# Patient Record
Sex: Female | Born: 1960 | Race: White | Hispanic: No | Smoking: Current every day smoker
Health system: Southern US, Community
[De-identification: ages and names within clinical notes are randomized; demographics above are authoritative.]

## PROBLEM LIST (undated history)

## (undated) DIAGNOSIS — K219 Gastro-esophageal reflux disease without esophagitis: Secondary | ICD-10-CM

## (undated) DIAGNOSIS — G4700 Insomnia, unspecified: Secondary | ICD-10-CM

## (undated) DIAGNOSIS — E785 Hyperlipidemia, unspecified: Secondary | ICD-10-CM

## (undated) DIAGNOSIS — M797 Fibromyalgia: Secondary | ICD-10-CM

## (undated) DIAGNOSIS — R0982 Postnasal drip: Secondary | ICD-10-CM

## (undated) DIAGNOSIS — F419 Anxiety disorder, unspecified: Secondary | ICD-10-CM

## (undated) DIAGNOSIS — M1990 Unspecified osteoarthritis, unspecified site: Secondary | ICD-10-CM

## (undated) DIAGNOSIS — K449 Diaphragmatic hernia without obstruction or gangrene: Secondary | ICD-10-CM

## (undated) DIAGNOSIS — K589 Irritable bowel syndrome without diarrhea: Secondary | ICD-10-CM

## (undated) DIAGNOSIS — T8859XA Other complications of anesthesia, initial encounter: Secondary | ICD-10-CM

## (undated) DIAGNOSIS — J449 Chronic obstructive pulmonary disease, unspecified: Secondary | ICD-10-CM

## (undated) DIAGNOSIS — F32A Depression, unspecified: Secondary | ICD-10-CM

## (undated) DIAGNOSIS — R011 Cardiac murmur, unspecified: Secondary | ICD-10-CM

## (undated) DIAGNOSIS — B1920 Unspecified viral hepatitis C without hepatic coma: Secondary | ICD-10-CM

## (undated) DIAGNOSIS — G510 Bell's palsy: Secondary | ICD-10-CM

## (undated) DIAGNOSIS — I10 Essential (primary) hypertension: Secondary | ICD-10-CM

## (undated) DIAGNOSIS — J45909 Unspecified asthma, uncomplicated: Secondary | ICD-10-CM

## (undated) DIAGNOSIS — E039 Hypothyroidism, unspecified: Secondary | ICD-10-CM

## (undated) DIAGNOSIS — M719 Bursopathy, unspecified: Secondary | ICD-10-CM

## (undated) DIAGNOSIS — E079 Disorder of thyroid, unspecified: Secondary | ICD-10-CM

## (undated) DIAGNOSIS — G8918 Other acute postprocedural pain: Secondary | ICD-10-CM

## (undated) DIAGNOSIS — F329 Major depressive disorder, single episode, unspecified: Secondary | ICD-10-CM

## (undated) HISTORY — PX: FOOT SURGERY: SHX648

## (undated) HISTORY — DX: Fibromyalgia: M79.7

## (undated) HISTORY — DX: Unspecified asthma, uncomplicated: J45.909

## (undated) HISTORY — DX: Cardiac murmur, unspecified: R01.1

## (undated) HISTORY — DX: Depression, unspecified: F32.A

## (undated) HISTORY — DX: Diaphragmatic hernia without obstruction or gangrene: K44.9

## (undated) HISTORY — DX: Unspecified osteoarthritis, unspecified site: M19.90

## (undated) HISTORY — DX: Irritable bowel syndrome, unspecified: K58.9

## (undated) HISTORY — DX: Anxiety disorder, unspecified: F41.9

## (undated) HISTORY — DX: Unspecified viral hepatitis C without hepatic coma: B19.20

## (undated) HISTORY — PX: PARTIAL HYSTERECTOMY: SHX80

## (undated) HISTORY — DX: Essential (primary) hypertension: I10

## (undated) HISTORY — DX: Hyperlipidemia, unspecified: E78.5

## (undated) HISTORY — DX: Bell's palsy: G51.0

## (undated) HISTORY — DX: Chronic obstructive pulmonary disease, unspecified: J44.9

## (undated) HISTORY — DX: Other acute postprocedural pain: G89.18

## (undated) HISTORY — DX: Disorder of thyroid, unspecified: E07.9

## (undated) HISTORY — DX: Insomnia, unspecified: G47.00

## (undated) HISTORY — PX: ELBOW SURGERY: SHX618

## (undated) HISTORY — DX: Bursopathy, unspecified: M71.9

## (undated) HISTORY — PX: TOTAL SHOULDER REPLACEMENT: SUR1217

## (undated) HISTORY — PX: NOSE SURGERY: SHX723

## (undated) HISTORY — PX: CARPAL TUNNEL RELEASE: SHX101

## (undated) HISTORY — DX: Major depressive disorder, single episode, unspecified: F32.9

---

## 2005-01-24 ENCOUNTER — Ambulatory Visit: Payer: Self-pay | Admitting: Family Medicine

## 2005-04-09 ENCOUNTER — Ambulatory Visit: Payer: Self-pay | Admitting: Family Medicine

## 2005-04-16 ENCOUNTER — Emergency Department: Payer: Self-pay | Admitting: Emergency Medicine

## 2005-04-16 ENCOUNTER — Ambulatory Visit: Payer: Self-pay | Admitting: Family Medicine

## 2005-09-24 ENCOUNTER — Ambulatory Visit: Payer: Self-pay | Admitting: General Surgery

## 2005-11-01 ENCOUNTER — Ambulatory Visit: Payer: Self-pay | Admitting: Family Medicine

## 2005-11-11 ENCOUNTER — Ambulatory Visit: Payer: Self-pay | Admitting: Family Medicine

## 2005-11-14 ENCOUNTER — Ambulatory Visit: Payer: Self-pay | Admitting: Unknown Physician Specialty

## 2006-05-14 ENCOUNTER — Ambulatory Visit: Payer: Self-pay

## 2007-05-26 ENCOUNTER — Ambulatory Visit: Payer: Self-pay | Admitting: Family Medicine

## 2007-07-07 ENCOUNTER — Ambulatory Visit: Payer: Self-pay | Admitting: Family Medicine

## 2007-12-07 ENCOUNTER — Emergency Department: Payer: Self-pay | Admitting: Emergency Medicine

## 2008-07-17 ENCOUNTER — Emergency Department: Payer: Self-pay | Admitting: Emergency Medicine

## 2009-01-17 ENCOUNTER — Ambulatory Visit: Payer: Self-pay | Admitting: Family Medicine

## 2009-05-15 ENCOUNTER — Ambulatory Visit: Payer: Self-pay | Admitting: Family Medicine

## 2009-06-29 ENCOUNTER — Ambulatory Visit: Payer: Self-pay | Admitting: Specialist

## 2009-08-21 ENCOUNTER — Ambulatory Visit: Payer: Self-pay | Admitting: Pain Medicine

## 2009-08-28 ENCOUNTER — Ambulatory Visit: Payer: Self-pay | Admitting: Pain Medicine

## 2009-09-28 ENCOUNTER — Ambulatory Visit: Payer: Self-pay | Admitting: Pain Medicine

## 2009-10-11 ENCOUNTER — Ambulatory Visit: Payer: Self-pay | Admitting: Pain Medicine

## 2009-10-31 ENCOUNTER — Ambulatory Visit: Payer: Self-pay | Admitting: Orthopedic Surgery

## 2009-11-16 ENCOUNTER — Ambulatory Visit: Payer: Self-pay | Admitting: Orthopedic Surgery

## 2009-11-18 ENCOUNTER — Emergency Department: Payer: Self-pay | Admitting: Emergency Medicine

## 2010-01-11 ENCOUNTER — Ambulatory Visit: Payer: Self-pay | Admitting: Pain Medicine

## 2010-01-22 ENCOUNTER — Ambulatory Visit: Payer: Self-pay | Admitting: Pain Medicine

## 2010-02-20 ENCOUNTER — Ambulatory Visit: Payer: Self-pay | Admitting: Pain Medicine

## 2010-05-25 ENCOUNTER — Ambulatory Visit: Payer: Self-pay | Admitting: Family Medicine

## 2010-06-05 ENCOUNTER — Ambulatory Visit: Payer: Self-pay | Admitting: Orthopedic Surgery

## 2010-07-02 ENCOUNTER — Ambulatory Visit: Payer: Self-pay | Admitting: Family Medicine

## 2010-08-14 ENCOUNTER — Ambulatory Visit: Payer: Self-pay | Admitting: Unknown Physician Specialty

## 2011-01-16 ENCOUNTER — Emergency Department: Payer: Self-pay | Admitting: Internal Medicine

## 2011-01-19 ENCOUNTER — Ambulatory Visit: Payer: Self-pay | Admitting: Unknown Physician Specialty

## 2011-02-08 ENCOUNTER — Ambulatory Visit: Payer: Self-pay | Admitting: Unknown Physician Specialty

## 2011-02-14 ENCOUNTER — Ambulatory Visit: Payer: Self-pay | Admitting: Unknown Physician Specialty

## 2011-07-16 HISTORY — PX: BACK SURGERY: SHX140

## 2011-08-14 ENCOUNTER — Ambulatory Visit: Payer: Self-pay | Admitting: Unknown Physician Specialty

## 2011-08-14 LAB — CREATININE, SERUM: EGFR (Non-African Amer.): >60

## 2012-01-24 ENCOUNTER — Ambulatory Visit: Payer: Self-pay | Admitting: Pain Medicine

## 2012-02-15 ENCOUNTER — Emergency Department: Payer: Self-pay | Admitting: Emergency Medicine

## 2012-02-20 ENCOUNTER — Emergency Department: Payer: Self-pay | Admitting: Emergency Medicine

## 2012-05-14 ENCOUNTER — Ambulatory Visit: Payer: Self-pay | Admitting: Family Medicine

## 2012-12-24 ENCOUNTER — Ambulatory Visit: Payer: Self-pay | Admitting: Family Medicine

## 2013-05-13 ENCOUNTER — Ambulatory Visit: Payer: Self-pay | Admitting: Family Medicine

## 2013-06-17 ENCOUNTER — Emergency Department: Payer: Self-pay | Admitting: Internal Medicine

## 2013-12-10 DIAGNOSIS — F32A Depression, unspecified: Secondary | ICD-10-CM | POA: Insufficient documentation

## 2013-12-10 DIAGNOSIS — F329 Major depressive disorder, single episode, unspecified: Secondary | ICD-10-CM | POA: Insufficient documentation

## 2013-12-10 DIAGNOSIS — E039 Hypothyroidism, unspecified: Secondary | ICD-10-CM | POA: Insufficient documentation

## 2013-12-11 DIAGNOSIS — I10 Essential (primary) hypertension: Secondary | ICD-10-CM | POA: Insufficient documentation

## 2014-02-10 DIAGNOSIS — E538 Deficiency of other specified B group vitamins: Secondary | ICD-10-CM | POA: Insufficient documentation

## 2014-03-10 DIAGNOSIS — Z8619 Personal history of other infectious and parasitic diseases: Secondary | ICD-10-CM | POA: Insufficient documentation

## 2014-03-22 DIAGNOSIS — J449 Chronic obstructive pulmonary disease, unspecified: Secondary | ICD-10-CM | POA: Insufficient documentation

## 2014-03-22 DIAGNOSIS — E669 Obesity, unspecified: Secondary | ICD-10-CM | POA: Insufficient documentation

## 2014-05-10 DIAGNOSIS — M4802 Spinal stenosis, cervical region: Secondary | ICD-10-CM | POA: Insufficient documentation

## 2014-10-21 DIAGNOSIS — M4712 Other spondylosis with myelopathy, cervical region: Secondary | ICD-10-CM | POA: Insufficient documentation

## 2014-10-21 DIAGNOSIS — G9520 Unspecified cord compression: Secondary | ICD-10-CM | POA: Insufficient documentation

## 2014-10-21 DIAGNOSIS — M4722 Other spondylosis with radiculopathy, cervical region: Secondary | ICD-10-CM | POA: Insufficient documentation

## 2014-10-21 DIAGNOSIS — M47812 Spondylosis without myelopathy or radiculopathy, cervical region: Secondary | ICD-10-CM | POA: Insufficient documentation

## 2014-10-21 HISTORY — PX: OTHER SURGICAL HISTORY: SHX169

## 2015-01-02 DIAGNOSIS — Z720 Tobacco use: Secondary | ICD-10-CM | POA: Insufficient documentation

## 2015-01-02 DIAGNOSIS — Z889 Allergy status to unspecified drugs, medicaments and biological substances status: Secondary | ICD-10-CM | POA: Insufficient documentation

## 2015-01-02 DIAGNOSIS — J418 Mixed simple and mucopurulent chronic bronchitis: Secondary | ICD-10-CM | POA: Insufficient documentation

## 2015-01-02 DIAGNOSIS — J449 Chronic obstructive pulmonary disease, unspecified: Secondary | ICD-10-CM | POA: Insufficient documentation

## 2015-03-21 ENCOUNTER — Other Ambulatory Visit: Payer: Self-pay | Admitting: Pediatrics

## 2015-03-21 DIAGNOSIS — Z1231 Encounter for screening mammogram for malignant neoplasm of breast: Secondary | ICD-10-CM

## 2015-03-28 ENCOUNTER — Ambulatory Visit: Payer: Self-pay | Attending: Pediatrics

## 2015-09-12 DIAGNOSIS — K1230 Oral mucositis (ulcerative), unspecified: Secondary | ICD-10-CM | POA: Insufficient documentation

## 2015-10-09 ENCOUNTER — Ambulatory Visit: Payer: Self-pay | Admitting: General Surgery

## 2015-10-09 DIAGNOSIS — R1030 Lower abdominal pain, unspecified: Secondary | ICD-10-CM | POA: Insufficient documentation

## 2016-01-02 ENCOUNTER — Encounter (INDEPENDENT_AMBULATORY_CARE_PROVIDER_SITE_OTHER): Payer: Self-pay

## 2016-01-02 ENCOUNTER — Encounter: Payer: Self-pay | Admitting: Pain Medicine

## 2016-01-02 ENCOUNTER — Ambulatory Visit: Payer: Medicaid Other | Attending: Pain Medicine | Admitting: Pain Medicine

## 2016-01-02 VITALS — BP 135/65 | HR 77 | Temp 98.5°F | Resp 20 | Ht 65.0 in | Wt 250.0 lb

## 2016-01-02 DIAGNOSIS — E039 Hypothyroidism, unspecified: Secondary | ICD-10-CM | POA: Insufficient documentation

## 2016-01-02 DIAGNOSIS — M25512 Pain in left shoulder: Secondary | ICD-10-CM | POA: Diagnosis not present

## 2016-01-02 DIAGNOSIS — M79604 Pain in right leg: Secondary | ICD-10-CM | POA: Diagnosis not present

## 2016-01-02 DIAGNOSIS — Z96611 Presence of right artificial shoulder joint: Secondary | ICD-10-CM | POA: Diagnosis not present

## 2016-01-02 DIAGNOSIS — F329 Major depressive disorder, single episode, unspecified: Secondary | ICD-10-CM | POA: Insufficient documentation

## 2016-01-02 DIAGNOSIS — F17210 Nicotine dependence, cigarettes, uncomplicated: Secondary | ICD-10-CM | POA: Diagnosis not present

## 2016-01-02 DIAGNOSIS — F1190 Opioid use, unspecified, uncomplicated: Secondary | ICD-10-CM | POA: Insufficient documentation

## 2016-01-02 DIAGNOSIS — J449 Chronic obstructive pulmonary disease, unspecified: Secondary | ICD-10-CM | POA: Diagnosis not present

## 2016-01-02 DIAGNOSIS — Z96619 Presence of unspecified artificial shoulder joint: Secondary | ICD-10-CM

## 2016-01-02 DIAGNOSIS — Z8619 Personal history of other infectious and parasitic diseases: Secondary | ICD-10-CM | POA: Diagnosis not present

## 2016-01-02 DIAGNOSIS — M5441 Lumbago with sciatica, right side: Secondary | ICD-10-CM

## 2016-01-02 DIAGNOSIS — I10 Essential (primary) hypertension: Secondary | ICD-10-CM | POA: Diagnosis not present

## 2016-01-02 DIAGNOSIS — K589 Irritable bowel syndrome without diarrhea: Secondary | ICD-10-CM | POA: Insufficient documentation

## 2016-01-02 DIAGNOSIS — E669 Obesity, unspecified: Secondary | ICD-10-CM | POA: Diagnosis not present

## 2016-01-02 DIAGNOSIS — M47812 Spondylosis without myelopathy or radiculopathy, cervical region: Secondary | ICD-10-CM

## 2016-01-02 DIAGNOSIS — Z79899 Other long term (current) drug therapy: Secondary | ICD-10-CM

## 2016-01-02 DIAGNOSIS — M160 Bilateral primary osteoarthritis of hip: Secondary | ICD-10-CM | POA: Insufficient documentation

## 2016-01-02 DIAGNOSIS — M5126 Other intervertebral disc displacement, lumbar region: Secondary | ICD-10-CM | POA: Insufficient documentation

## 2016-01-02 DIAGNOSIS — M797 Fibromyalgia: Secondary | ICD-10-CM | POA: Insufficient documentation

## 2016-01-02 DIAGNOSIS — M4802 Spinal stenosis, cervical region: Secondary | ICD-10-CM | POA: Diagnosis not present

## 2016-01-02 DIAGNOSIS — F419 Anxiety disorder, unspecified: Secondary | ICD-10-CM | POA: Insufficient documentation

## 2016-01-02 DIAGNOSIS — M545 Low back pain: Secondary | ICD-10-CM

## 2016-01-02 DIAGNOSIS — Z9889 Other specified postprocedural states: Secondary | ICD-10-CM | POA: Insufficient documentation

## 2016-01-02 DIAGNOSIS — Z79891 Long term (current) use of opiate analgesic: Secondary | ICD-10-CM | POA: Diagnosis not present

## 2016-01-02 DIAGNOSIS — M961 Postlaminectomy syndrome, not elsewhere classified: Secondary | ICD-10-CM

## 2016-01-02 DIAGNOSIS — M25552 Pain in left hip: Secondary | ICD-10-CM | POA: Insufficient documentation

## 2016-01-02 DIAGNOSIS — G8929 Other chronic pain: Secondary | ICD-10-CM | POA: Insufficient documentation

## 2016-01-02 DIAGNOSIS — G5622 Lesion of ulnar nerve, left upper limb: Secondary | ICD-10-CM | POA: Insufficient documentation

## 2016-01-02 DIAGNOSIS — J45909 Unspecified asthma, uncomplicated: Secondary | ICD-10-CM | POA: Insufficient documentation

## 2016-01-02 DIAGNOSIS — M25559 Pain in unspecified hip: Secondary | ICD-10-CM

## 2016-01-02 DIAGNOSIS — Z96612 Presence of left artificial shoulder joint: Secondary | ICD-10-CM | POA: Insufficient documentation

## 2016-01-02 DIAGNOSIS — E538 Deficiency of other specified B group vitamins: Secondary | ICD-10-CM | POA: Diagnosis not present

## 2016-01-02 DIAGNOSIS — M47816 Spondylosis without myelopathy or radiculopathy, lumbar region: Secondary | ICD-10-CM | POA: Insufficient documentation

## 2016-01-02 DIAGNOSIS — Z5181 Encounter for therapeutic drug level monitoring: Secondary | ICD-10-CM

## 2016-01-02 DIAGNOSIS — M25551 Pain in right hip: Secondary | ICD-10-CM | POA: Insufficient documentation

## 2016-01-02 DIAGNOSIS — M25511 Pain in right shoulder: Secondary | ICD-10-CM | POA: Insufficient documentation

## 2016-01-02 DIAGNOSIS — M25519 Pain in unspecified shoulder: Secondary | ICD-10-CM | POA: Diagnosis present

## 2016-01-02 DIAGNOSIS — M47892 Other spondylosis, cervical region: Secondary | ICD-10-CM | POA: Diagnosis not present

## 2016-01-02 DIAGNOSIS — M5021 Other cervical disc displacement,  high cervical region: Secondary | ICD-10-CM | POA: Insufficient documentation

## 2016-01-02 DIAGNOSIS — Z0189 Encounter for other specified special examinations: Secondary | ICD-10-CM | POA: Insufficient documentation

## 2016-01-02 DIAGNOSIS — M542 Cervicalgia: Secondary | ICD-10-CM | POA: Diagnosis present

## 2016-01-02 DIAGNOSIS — M539 Dorsopathy, unspecified: Secondary | ICD-10-CM

## 2016-01-02 DIAGNOSIS — M5412 Radiculopathy, cervical region: Secondary | ICD-10-CM

## 2016-01-02 NOTE — Patient Instructions (Signed)
Instructed to get labs and xrays done in the medical mall today.

## 2016-01-02 NOTE — Progress Notes (Signed)
Safety precautions to be maintained throughout the outpatient stay will include: orient to surroundings, keep bed in low position, maintain call bell within reach at all times, provide assistance with transfer out of bed and ambulation.  

## 2016-01-02 NOTE — Progress Notes (Signed)
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 03/10/2014  . BP (high blood pressure) 12/11/2013  . Adult hypothyroidism 12/10/2013  . Pain in shoulder 12/10/2013  . Adiposity 03/22/2014  . Mucositis oral 09/12/2015  . Failed back syndrome of lumbar spine 12/17/2011  . Groin pain 10/09/2015  . Current tobacco use 01/02/2015  . Chronic shoulder pain (Location of Primary Source of Pain) (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) 01/02/2016  . History of total shoulder replacement (Bilateral) 01/02/2016  . Chronic hip pain (Location of Secondary source of pain) (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) 01/02/2016  . 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) 01/02/2016  . Chronic low back pain (Location of Tertiary source of pain) (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) 01/02/2016  . Lumbar facet syndrome (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) 01/02/2016  . Lumbar spondylosis 01/02/2016  . 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 Chronic pain of right lower extremity 01/02/2016  . Chronic neck pain (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) 01/02/2016  . Chronic cervical radicular pain (Right) 01/02/2016  . Ulnar neuropathy of left upper  extremity 01/02/2016  . Hx of cervical spine surgery 01/02/2016  . Cervical spondylosis 01/02/2016  . Encounter for therapeutic drug level monitoring 01/02/2016  . Encounter for pain management planning 01/02/2016   Resolved Ambulatory Problems    Diagnosis Date Noted  . No Resolved Ambulatory Problems   Past Medical History  Diagnosis Date  . Heart murmur   . COPD (chronic obstructive pulmonary  disease) ([place])   . Asthma   . Bell's palsy   . Anxiety   . Depression   . Insomnia   . Hiatal hernia   . IBS (irritable bowel syndrome)   . Hepatitis C   . Thyroid disease   . Fibromyalgia     Constitutional Exam  Vitals: Blood pressure 135/65, pulse 77, temperature 98.5 F (36.9 C), resp. rate 20, height 5\' 5"  (1.651 m), weight 250 lb (113.399 kg), SpO2 97 %. General appearance: Well nourished, well developed, and well hydrated. In no acute distress Calculated BMI/Body habitus: Body mass index is 41.6 kg/(m^2). (>40 kg/m2) Extreme obesity (Class III) - 254% higher incidence of chronic pain Psych/Mental status: Alert and oriented x 3 (person, place, & time) Eyes: PERLA Respiratory: No evidence of acute respiratory distress  Cervical Spine Exam  Inspection: No masses, redness, or swelling Alignment: Symmetrical ROM: Functional: Adequate ROM Active: Unrestricted ROM Stability: No instability detected Muscle strength & Tone: Functionally intact Sensory: Unimpaired Palpation: No complaints of tenderness  Upper Extremity (UE) Exam    Side: Right upper extremity  Side: Left upper extremity  Inspection: The patient has her right arm on a sling due to recent shoulder surgery where she had it replaced.  Inspection: No masses, redness, swelling, or asymmetry  ROM:  ROM:  Functional: Pain-restricted ROM  Functional: Adequate ROM  Active: Zero ROM at this time.   Active: Unrestricted ROM  Muscle strength & Tone: Functionally intact  Muscle strength & Tone: Functionally intact   Sensory: Movement-associated pain  Sensory: Unimpaired  Palpation: Tender  Palpation: Non-contributory   Thoracic Spine Exam  Inspection: No masses, redness, or swelling Alignment: Symmetrical ROM: Functional: Adequate ROM Active: Unrestricted ROM Stability: No instability detected Sensory: Unimpaired Muscle strength & Tone: Functionally intact Palpation: No complaints of tenderness  Lumbar Spine Exam  Inspection: No masses, redness, or swelling Alignment: Symmetrical ROM: Functional: Limited ROM Active: Limited ROM Stability: No instability detected Muscle strength & Tone: Functionally intact Sensory: Unimpaired Palpation: No complaints of tenderness Provocative Tests: Lumbar Hyperextension and rotation test: deferred Patrick's Maneuver: deferred  Gait & Posture Assessment  Ambulation: Unassisted Gait: Unaffected Posture: WNL  Lower Extremity Exam    Side: Right lower extremity  Side: Left lower extremity  Inspection: No masses, redness, swelling, or asymmetry ROM:  Inspection: No masses, redness, swelling, or asymmetry ROM:  Functional: Adequate ROM  Functional: Adequate ROM  Active: Unrestricted ROM  Active: Unrestricted ROM  Muscle strength & Tone: Functionally intact  Muscle strength & Tone: Functionally intact  Sensory: Unimpaired  Sensory: Unimpaired  Palpation: Non-contributory  Palpation: Non-contributory   Assessment  Primary Diagnosis & Pertinent Problem List: The primary encounter diagnosis was Chronic pain. Diagnoses of Long term current use of opiate analgesic, Long term prescription opiate use, Opiate use, Chronic shoulder pain (Location of Primary Source of Pain) (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), History of total shoulder replacement, unspecified laterality, Chronic hip pain, unspecified laterality, Primary osteoarthritis of both hips, Chronic low back pain (Location of Tertiary source of pain) (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), Lumbar facet syndrome (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), Lumbar  spondylosis, unspecified spinal osteoarthritis, Failed back surgical syndrome, Chronic pain of right lower extremity, Chronic neck pain (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), Chronic cervical radicular pain (Right), Ulnar neuropathy of left upper extremity, Hx of cervical spine surgery, Cervical spondylosis, Encounter for therapeutic drug level monitoring, and Encounter for pain management planning were also pertinent to this visit.  Visit Diagnosis: 1. Chronic pain   2. Long term current use of opiate analgesic  3. Long term prescription opiate use   4. Opiate use   5. Chronic shoulder pain (Location of Primary Source of Pain) (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)   6. History of total shoulder replacement, unspecified laterality   7. Chronic hip pain, unspecified laterality   8. Primary osteoarthritis of both hips   9. Chronic low back pain (Location of Tertiary source of pain) (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)   10. Lumbar facet syndrome (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)   11. Lumbar spondylosis, unspecified spinal osteoarthritis   12. Failed back surgical syndrome   13. Chronic pain of right lower extremity   14. Chronic neck pain (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)   15. Chronic cervical radicular pain (Right)   16. Ulnar neuropathy of left upper extremity   17. Hx of cervical spine surgery   18. Cervical spondylosis   19. Encounter for therapeutic drug level monitoring   20. Encounter for pain management planning     Assessment: No problem-specific assessment & plan notes found for this encounter.   Plan of Care  Initial Treatment Plan:  Please be advised that as per protocol, today's visit has been an evaluation only. We have not taken over the patient's controlled substance management.  Problem List Items Addressed This Visit      High   Cervical spondylosis (Chronic)   Relevant Medications   acetaminophen (TYLENOL) 325 MG tablet   Oxycodone HCl 10 MG TABS   Chronic cervical radicular pain (Right) (Chronic)   Relevant Medications   ALPRAZolam (XANAX) 1  MG tablet   citalopram (CELEXA) 40 MG tablet   Chronic hip pain (Location of Secondary source of pain) (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) (Chronic)   Relevant Medications   acetaminophen (TYLENOL) 325 MG tablet   citalopram (CELEXA) 40 MG tablet   Oxycodone HCl 10 MG TABS   Other Relevant Orders   DG HIP UNILAT W OR W/O PELVIS 2-3 VIEWS LEFT   DG HIP UNILAT W OR W/O PELVIS 2-3 VIEWS RIGHT   Chronic low back pain (Location of Tertiary source of pain) (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) (Chronic)   Relevant Medications   acetaminophen (TYLENOL) 325 MG tablet   Oxycodone HCl 10 MG TABS   Other Relevant Orders   DG Lumbar Spine Complete W/Bend   DG Si Joints   Chronic neck pain (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) (Chronic)   Relevant Medications   acetaminophen (TYLENOL) 325 MG tablet   citalopram (CELEXA) 40 MG tablet   Oxycodone HCl 10 MG TABS   Chronic pain - Primary (Chronic)   Relevant Medications   acetaminophen (TYLENOL) 325 MG tablet   citalopram (CELEXA) 40 MG tablet   Oxycodone HCl 10 MG TABS   Other Relevant Orders   Comprehensive metabolic panel   C-reactive protein   Magnesium   Sedimentation rate   Vitamin B12   25-Hydroxyvitamin D Lcms D2+D3   Chronic pain of right lower extremity (Chronic)   Relevant Medications   acetaminophen (TYLENOL) 325 MG tablet   citalopram (CELEXA) 40 MG tablet   Oxycodone HCl 10 MG TABS   Chronic shoulder pain (Location of Primary Source of Pain) (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) (Chronic)   Relevant Orders   DG Shoulder Left   DG Shoulder Right   Failed back surgical syndrome (Chronic)   Relevant Medications   acetaminophen (TYLENOL) 325 MG tablet   Oxycodone HCl 10 MG TABS   History of total shoulder replacement (Bilateral) (Chronic)   Relevant Orders   DG Shoulder Left   DG Shoulder Right   Hx of cervical spine surgery (Chronic)   Lumbar  facet syndrome (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) (Chronic)   Relevant Medications   acetaminophen (TYLENOL) 325 MG tablet   Oxycodone HCl 10 MG TABS   Lumbar spondylosis  (Chronic)   Relevant Medications   acetaminophen (TYLENOL) 325 MG tablet   Oxycodone HCl 10 MG TABS   Other Relevant Orders   DG Lumbar Spine Complete W/Bend   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) (Chronic)   Relevant Medications   acetaminophen (TYLENOL) 325 MG tablet   Oxycodone HCl 10 MG TABS   Other Relevant Orders   DG HIP UNILAT W OR W/O PELVIS 2-3 VIEWS LEFT   DG HIP UNILAT W OR W/O PELVIS 2-3 VIEWS RIGHT   Ulnar neuropathy of left upper extremity (Chronic)   Relevant Medications   ALPRAZolam (XANAX) 1 MG tablet   citalopram (CELEXA) 40 MG tablet     Medium   Encounter for pain management planning   Encounter for therapeutic drug level monitoring   Long term current use of opiate analgesic (Chronic)   Relevant Orders   Compliance Drug Analysis, Ur   Long term prescription opiate use (Chronic)   Opiate use (Chronic)      Pharmacotherapy (Medications Ordered): No orders of the defined types were placed in this encounter.    Lab-work & Procedure Ordered: Orders Placed This Encounter  Procedures  . DG Lumbar Spine Complete W/Bend  . DG Si Joints  . DG HIP UNILAT W OR W/O PELVIS 2-3 VIEWS LEFT  . DG HIP UNILAT W OR W/O PELVIS 2-3 VIEWS RIGHT  . DG Shoulder Left  . DG Shoulder Right  . Compliance Drug Analysis, Ur  . Comprehensive metabolic panel  . C-reactive protein  . Magnesium  . Sedimentation rate  . Vitamin B12  . 25-Hydroxyvitamin D Lcms D2+D3    Imaging Ordered: DG LUMBAR SPINE COMPLETE W/BEND 6+V DG SI JOINTS DG HIP UNILAT W OR W/O PELVIS 2-3 VIEWS LEFT DG HIP UNILAT W OR W/O PELVIS 2-3 VIEWS RIGHT DG SHOULDER LEFT DG SHOULDER RIGHT  Interventional Therapies: Scheduled:  None at this time.    Considering:   Bilateral diagnostic suprascapular nerve block under fluoroscopic guidance and IV sedation.  Possible bilateral suprascapular nerve radiofrequency ablation, depending on the results of a diagnostic injection.    PRN Procedures:  None  at this time.    Referral(s) or Consult(s): Medical psychology consult for substance use disorder evaluation  Medications administered during this visit: Danielle [name] had no medications administered during this visit.  Prescriptions ordered during this visit: New Prescriptions   No medications on file    Requested PM Follow-up: Return for Return after [facility] (SUD) Eval.  No future appointments.   Primary Care Physician: [facility] PRIMARY CARE Location: [facility] Note by: Kathlen Brunswick. [name], M.D, DABA, DABAPM, DABPM, DABIPP, FIPP  Pain Score Disclaimer: We use the NRS-11 scale. This is a self-reported, subjective measurement of pain severity with only modest accuracy. It is used primarily to identify changes within a particular patient. It must be understood that outpatient pain scales are significantly less accurate that those used for research, where they can be applied under ideal controlled circumstances with minimal exposure to variables. In reality, the score is likely to be a combination of pain intensity and pain affect, where pain affect describes the degree of emotional arousal or changes in action readiness caused by the sensory experience of pain. Factors such as social and work situation, setting, emotional state, anxiety levels, expectation, and prior pain experience may  influence pain perception and show large inter-individual differences that may also be affected by time variables.  Patient instructions provided during this appointment: Patient Instructions  Instructed to get labs and xrays done in the medical mall today.

## 2016-01-08 ENCOUNTER — Other Ambulatory Visit
Admission: RE | Admit: 2016-01-08 | Discharge: 2016-01-08 | Disposition: A | Discharge status: Home / Self Care | Payer: Medicaid Other | Source: Ambulatory Visit | Attending: Pain Medicine | Admitting: Pain Medicine

## 2016-01-08 ENCOUNTER — Ambulatory Visit
Admission: RE | Admit: 2016-01-08 | Discharge: 2016-01-08 | Disposition: A | Discharge status: Home / Self Care | Payer: Medicaid Other | Source: Ambulatory Visit | Attending: Pain Medicine | Admitting: Pain Medicine

## 2016-01-08 DIAGNOSIS — G8929 Other chronic pain: Secondary | ICD-10-CM | POA: Diagnosis not present

## 2016-01-08 DIAGNOSIS — M85811 Other specified disorders of bone density and structure, right shoulder: Secondary | ICD-10-CM | POA: Diagnosis not present

## 2016-01-08 DIAGNOSIS — Z9889 Other specified postprocedural states: Secondary | ICD-10-CM | POA: Insufficient documentation

## 2016-01-08 DIAGNOSIS — Z96619 Presence of unspecified artificial shoulder joint: Secondary | ICD-10-CM

## 2016-01-08 DIAGNOSIS — M25511 Pain in right shoulder: Secondary | ICD-10-CM

## 2016-01-08 DIAGNOSIS — M160 Bilateral primary osteoarthritis of hip: Secondary | ICD-10-CM | POA: Insufficient documentation

## 2016-01-08 DIAGNOSIS — M5136 Other intervertebral disc degeneration, lumbar region: Secondary | ICD-10-CM | POA: Insufficient documentation

## 2016-01-08 DIAGNOSIS — R938 Abnormal findings on diagnostic imaging of other specified body structures: Secondary | ICD-10-CM | POA: Insufficient documentation

## 2016-01-08 DIAGNOSIS — M25512 Pain in left shoulder: Secondary | ICD-10-CM | POA: Diagnosis present

## 2016-01-08 DIAGNOSIS — M25559 Pain in unspecified hip: Secondary | ICD-10-CM

## 2016-01-08 DIAGNOSIS — M545 Low back pain: Principal | ICD-10-CM

## 2016-01-08 DIAGNOSIS — M47816 Spondylosis without myelopathy or radiculopathy, lumbar region: Secondary | ICD-10-CM

## 2016-01-08 LAB — COMPREHENSIVE METABOLIC PANEL
ALBUMIN: 3.7 g/dL (ref 3.5–5.0)
ALT: 14 U/L (ref 14–54)
ANION GAP: 7 (ref 5–15)
AST: 18 U/L (ref 15–41)
Alkaline Phosphatase: 79 U/L (ref 38–126)
BILIRUBIN TOTAL: 0.3 mg/dL (ref 0.3–1.2)
BUN: 8 mg/dL (ref 6–20)
CHLORIDE: 101 mmol/L (ref 101–111)
CO2: 29 mmol/L (ref 22–32)
Calcium: 9 mg/dL (ref 8.9–10.3)
Creatinine, Ser: 0.94 mg/dL (ref 0.44–1.00)
GFR calc Af Amer: >60 mL/min (ref >60)
GFR calc non Af Amer: >60 mL/min (ref >60)
GLUCOSE: 91 mg/dL (ref 65–99)
POTASSIUM: 3.5 mmol/L (ref 3.5–5.1)
Sodium: 137 mmol/L (ref 135–145)
TOTAL PROTEIN: 6.9 g/dL (ref 6.5–8.1)

## 2016-01-08 LAB — MAGNESIUM: Magnesium: 2.1 mg/dL (ref 1.7–2.4)

## 2016-01-08 LAB — SEDIMENTATION RATE: SED RATE: 37 mm/h — AB (ref 0–30)

## 2016-01-08 LAB — C-REACTIVE PROTEIN: CRP: 0.9 mg/dL (ref <1.0)

## 2016-01-08 LAB — VITAMIN B12

## 2016-01-09 ENCOUNTER — Telehealth: Payer: Self-pay | Admitting: *Deleted

## 2016-01-09 ENCOUNTER — Other Ambulatory Visit: Payer: Self-pay

## 2016-01-09 NOTE — Telephone Encounter (Signed)
Pt called stating she got everything completed except for the lumbar spine part of the xray due to surgery on her shoulder. Please give the pt a call...td

## 2016-01-09 NOTE — Telephone Encounter (Signed)
Spoke with patient and she states she has completed all of the orders except for the lumbar spine xray.  She has recently had shoulder replacement and was unable to do the xray due to decreased ROM.  States she has seen medical psychologist and had labwork drawn.  Transferred to secretary to get an appointment.

## 2016-01-10 ENCOUNTER — Telehealth: Payer: Self-pay | Admitting: *Deleted

## 2016-01-10 LAB — COMPLIANCE DRUG ANALYSIS, UR: PDF: 0

## 2016-01-10 NOTE — Telephone Encounter (Signed)
Return pt call. Lm gave appt for 01/11/16@ 9:40am...td

## 2016-01-11 ENCOUNTER — Ambulatory Visit: Payer: Medicaid Other | Attending: Pain Medicine | Admitting: Pain Medicine

## 2016-01-11 ENCOUNTER — Encounter: Payer: Self-pay | Admitting: Pain Medicine

## 2016-01-11 VITALS — HR 69 | Temp 98.5°F | Resp 18 | Ht 65.0 in | Wt 252.0 lb

## 2016-01-11 DIAGNOSIS — M797 Fibromyalgia: Secondary | ICD-10-CM | POA: Diagnosis not present

## 2016-01-11 DIAGNOSIS — Z8619 Personal history of other infectious and parasitic diseases: Secondary | ICD-10-CM | POA: Insufficient documentation

## 2016-01-11 DIAGNOSIS — M47892 Other spondylosis, cervical region: Secondary | ICD-10-CM | POA: Insufficient documentation

## 2016-01-11 DIAGNOSIS — M533 Sacrococcygeal disorders, not elsewhere classified: Secondary | ICD-10-CM

## 2016-01-11 DIAGNOSIS — G5622 Lesion of ulnar nerve, left upper limb: Secondary | ICD-10-CM | POA: Insufficient documentation

## 2016-01-11 DIAGNOSIS — I10 Essential (primary) hypertension: Secondary | ICD-10-CM | POA: Diagnosis not present

## 2016-01-11 DIAGNOSIS — E039 Hypothyroidism, unspecified: Secondary | ICD-10-CM | POA: Insufficient documentation

## 2016-01-11 DIAGNOSIS — Z79891 Long term (current) use of opiate analgesic: Secondary | ICD-10-CM | POA: Diagnosis not present

## 2016-01-11 DIAGNOSIS — M47816 Spondylosis without myelopathy or radiculopathy, lumbar region: Secondary | ICD-10-CM | POA: Insufficient documentation

## 2016-01-11 DIAGNOSIS — Z6841 Body Mass Index (BMI) 40.0 and over, adult: Secondary | ICD-10-CM | POA: Insufficient documentation

## 2016-01-11 DIAGNOSIS — M25559 Pain in unspecified hip: Secondary | ICD-10-CM | POA: Insufficient documentation

## 2016-01-11 DIAGNOSIS — M542 Cervicalgia: Secondary | ICD-10-CM | POA: Diagnosis not present

## 2016-01-11 DIAGNOSIS — M160 Bilateral primary osteoarthritis of hip: Secondary | ICD-10-CM | POA: Diagnosis not present

## 2016-01-11 DIAGNOSIS — M25512 Pain in left shoulder: Secondary | ICD-10-CM | POA: Insufficient documentation

## 2016-01-11 DIAGNOSIS — Z9889 Other specified postprocedural states: Secondary | ICD-10-CM | POA: Insufficient documentation

## 2016-01-11 DIAGNOSIS — J449 Chronic obstructive pulmonary disease, unspecified: Secondary | ICD-10-CM | POA: Insufficient documentation

## 2016-01-11 DIAGNOSIS — M8580 Other specified disorders of bone density and structure, unspecified site: Secondary | ICD-10-CM | POA: Insufficient documentation

## 2016-01-11 DIAGNOSIS — F17210 Nicotine dependence, cigarettes, uncomplicated: Secondary | ICD-10-CM | POA: Insufficient documentation

## 2016-01-11 DIAGNOSIS — Z96611 Presence of right artificial shoulder joint: Secondary | ICD-10-CM | POA: Diagnosis not present

## 2016-01-11 DIAGNOSIS — E669 Obesity, unspecified: Secondary | ICD-10-CM | POA: Insufficient documentation

## 2016-01-11 DIAGNOSIS — F1190 Opioid use, unspecified, uncomplicated: Secondary | ICD-10-CM

## 2016-01-11 DIAGNOSIS — M25511 Pain in right shoulder: Secondary | ICD-10-CM | POA: Diagnosis not present

## 2016-01-11 DIAGNOSIS — M79604 Pain in right leg: Secondary | ICD-10-CM | POA: Diagnosis not present

## 2016-01-11 DIAGNOSIS — G8929 Other chronic pain: Secondary | ICD-10-CM

## 2016-01-11 DIAGNOSIS — M961 Postlaminectomy syndrome, not elsewhere classified: Secondary | ICD-10-CM

## 2016-01-11 DIAGNOSIS — Z79899 Other long term (current) drug therapy: Secondary | ICD-10-CM

## 2016-01-11 DIAGNOSIS — M545 Low back pain: Secondary | ICD-10-CM | POA: Diagnosis not present

## 2016-01-11 DIAGNOSIS — M5412 Radiculopathy, cervical region: Secondary | ICD-10-CM

## 2016-01-11 DIAGNOSIS — Z96619 Presence of unspecified artificial shoulder joint: Secondary | ICD-10-CM | POA: Insufficient documentation

## 2016-01-11 DIAGNOSIS — Z981 Arthrodesis status: Secondary | ICD-10-CM | POA: Insufficient documentation

## 2016-01-11 DIAGNOSIS — M539 Dorsopathy, unspecified: Secondary | ICD-10-CM

## 2016-01-11 LAB — 25-HYDROXY VITAMIN D LCMS D2+D3
25-Hydroxy, Vitamin D-2: 2 ng/mL
25-Hydroxy, Vitamin D-3: 30 ng/mL
25-Hydroxy, Vitamin D: 32 ng/mL

## 2016-01-11 MED ORDER — NALOXONE HCL 2 MG/2ML IJ SOSY: PREFILLED_SYRINGE | INTRAMUSCULAR | Status: DC

## 2016-01-11 NOTE — Progress Notes (Signed)
Safety precautions to be maintained throughout the outpatient stay will include: orient to surroundings, keep bed in low position, maintain call bell within reach at all times, provide assistance with transfer out of bed and ambulation.  

## 2016-01-11 NOTE — Progress Notes (Signed)
Patient's Name: Danielle [name]  Patient type: Established  MRN: [id number]  Service setting: Ambulatory outpatient  DOB: 1960-08-05  Location: [facility]  DOS: 01/11/2016  Primary Care Physician: [facility]  Note by: Beatriz Chancellor A. [name], M.D, DABA, DABAPM, DABPM, DABIPP, FIPP  Referring Physician: [facility], [place] [facility]  Specialty: Board-Certified Interventional Pain Management  Last Visit to Pain Management: 01/10/2016   Primary Reason(s) for Visit: Encounter for evaluation before starting new chronic pain management plan of care (Level of risk: moderate) CC: Back Pain; Shoulder Pain; and Hip Pain   HPI  Danielle [name] is a 55 y.o. year old, female patient, who returns today as an established patient. She has Chronic pain; Long term current use of opiate analgesic; Long term prescription opiate use; Opiate use (90 MME/Day); Allergy to drug; B12 deficiency; Cervical spinal cord compression [facility]) (April, 2017); Cervical spinal stenosis; Chronic obstructive pulmonary disease ([place]); Chronic obstructive bronchitis ([place]); Clinical depression; Personal history of other infectious and parasitic diseases; BP (high blood pressure); Adult hypothyroidism; Adiposity; Mucositis oral; Groin pain; Current tobacco use; Chronic shoulder pain (Location of Primary Source of Pain) (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); History of total shoulder replacement (Bilateral); Chronic hip pain (Location of Secondary source of pain) (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); 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 Her primarily concern today is the Back Pain; Shoulder Pain; and Hip Pain   Pain Assessment: Self-Reported Pain Score: 4  Reported level is compatible with observation       Pain Type: Chronic pain Pain Location: Back Pain Orientation: Lower Pain Descriptors / Indicators: Aching, Dull, Sharp, Heaviness Pain Frequency: Constant  The patient comes into the clinics today for post-procedure evaluation on the interventional treatment done on 01/02/2016. In addition, she comes in today for pharmacological management of her chronic pain.  The patient  reports that she does not use illicit drugs.  Date of Last Visit: 01/02/16 Service Provided on Last Visit: Evaluation  Controlled Substance Pharmacotherapy Assessment & REMS (Risk Evaluation and Mitigation Strategy)  Analgesic: Oxycodone IR 10 mg every 4 hours (60 mg/day of oxycodone). Alprazolam 1 mg 3 times a day. MME/day: 90 mg/day Pill Count: The patient did not bring any of her pills to this appointment. Pharmacokinetics: Onset of action (Liberation/Absorption): Within expected pharmacological parameters Time to Peak effect (Distribution): Timing and results are as within normal expected parameters Duration of action (Metabolism/Excretion): Within normal limits for medication Pharmacodynamics: Analgesic Effect: More than 50% Activity Facilitation: Medication(s) allow patient to sit, stand, walk, and do the basic ADLs Perceived Effectiveness: Described as relatively effective, allowing for increase in activities of daily living (ADL) Side-effects or Adverse reactions: None reported Monitoring: [place] PMP: Online review of the past 13-month period conducted. Compliant with practice rules and regulations Last UDS on record: SUMMARY  Date Value Ref Range Status  01/02/2016 FINAL  Final    Comment:     ==================================================================== TOXASSURE COMP DRUG ANALYSIS,UR ==================================================================== Test  Result       Flag       Units Drug Present and Declared for Prescription Verification   Oxycodone                      3985         EXPECTED   ng/mg creat   Oxymorphone                    451          EXPECTED   ng/mg creat   Noroxycodone                   >4464        EXPECTED   ng/mg creat   Noroxymorphone                 107          EXPECTED   ng/mg creat    Sources of oxycodone are scheduled prescription medications.    Oxymorphone, noroxycodone, and noroxymorphone are expected    metabolites of oxycodone. Oxymorphone is also available as a    scheduled prescription medication.   Citalopram                     PRESENT      EXPECTED   Desmethylcitalopram            PRESENT      EXPECTED    Desmethylcitalopram is an expected metabolite of citalopram or    the enantiomeric form, escitalopram.   Acetaminophen                  PRESENT      EXPECTED Drug Present not Declared for Prescription Verification   Tramadol                       PRESENT      UNEXPECTED   O-Desmethyltramadol            PRESENT      UNEXPECTED   N-Desmethyltramadol            PRESENT      UNEXPECTED    Source of tramadol is a prescription medication.    O-desmethyltramadol and N-desmethyltramadol are expected    metabolites of tramadol.   Diphenhydramine                PRESENT      UNEXPECTED Drug Absent but Declared for Prescription Verification   Alprazolam                     Not Detected UNEXPECTED ng/mg creat   Diclofenac                     Not Detected UNEXPECTED    Diclofenac, as indicated in the declared medication list, is not    always detected even when used as directed. ==================================================================== Test                      Result    Flag   Units      Ref  Range   Creatinine              224              mg/dL      >=20 ==================================================================== Declared Medications:  The flagging and interpretation on this report are  based on the  following declared medications.  Unexpected results may arise from  inaccuracies in the declared medications.  **Note: The testing scope of this panel includes these medications:  Alprazolam (Xanax)  Citalopram (Celexa)  Oxycodone  **Note: The testing scope of this panel does not include small to  moderate amounts of these reported medications:  Acetaminophen (Tylenol)  Diclofenac (Voltaren)  **Note: The testing scope of this panel does not include following  reported medications:  Albuterol (ProAir HFA)  Albuterol (Proventil)  Docusate (Colace)  Levothyroxine (Synthroid)  Loratadine (Claritin)  Magnesium (Milk of Magnesia)  Montelukast (Singulair)  Nystatin (Mycostatin)  Ondansetron (Zofran)  Tiotropium (Spiriva)  Vitamin B12  Vitamin D3 ==================================================================== For clinical consultation, please call [phone]. ====================================================================    UDS interpretation: Compliant Medication Assessment Form: Reviewed. Patient indicates being compliant with therapy Treatment compliance: Compliant Risk Assessment: Aberrant Behavior: None observed today Substance Use Disorder (SUD) Risk Level: Very High Risk of opioid abuse or dependence: 0.7-3.0% with doses ? 36 MME/day and 6.1-26% with doses ? 120 MME/day. Opioid Risk Tool (ORT) Score:  1 Low Risk for SUD (Score <3) Depression Scale Score: PHQ-2: PHQ-2 Total Score: 0 No depression (0) PHQ-9: PHQ-9 Total Score: 0 No depression (0-4)  Pharmacologic Plan: No change in therapy, at this time  Previous Illicit Drug Screen Labs(s): No results found for: MDMA, COCAINSCRNUR, PCPSCRNUR, [facility]  Laboratory Chemistry  Inflammation  Markers Lab Results  Component Value Date   ESRSEDRATE 37* 01/08/2016   CRP 0.9 01/08/2016    Renal Function Lab Results  Component Value Date   BUN 8 01/08/2016   CREATININE 0.94 01/08/2016   GFRAA >60 01/08/2016   GFRNONAA >60 01/08/2016    Hepatic Function Lab Results  Component Value Date   AST 18 01/08/2016   ALT 14 01/08/2016   ALBUMIN 3.7 01/08/2016    Electrolytes Lab Results  Component Value Date   NA 137 01/08/2016   K 3.5 01/08/2016   CL 101 01/08/2016   CALCIUM 9.0 01/08/2016   MG 2.1 01/08/2016    Pain Modulating Vitamins Lab Results  Component Value Date   25OHVITD1 32 01/08/2016   25OHVITD2 2.0 01/08/2016   25OHVITD3 30 01/08/2016   VITAMINB12 >7500* 01/08/2016    Coagulation Parameters No results found for: INR, LABPROT, APTT, PLT  Note: Labs Reviewed.  Recent Diagnostic Imaging  Dg Si Joints  01/09/2016  CLINICAL DATA:  Bilateral hip pain. No known injury. Initial evaluation EXAM: BILATERAL SACROILIAC JOINTS - 3+ VIEW COMPARISON:  MRI 08/14/2011.Lumbar spine series 05/28/2010. FINDINGS: Degenerative changes lumbar spine, both SI joints, both hips. No acute abnormality identified. No evidence of erosive arthropathy. Pelvic calcifications consistent phleboliths. IMPRESSION: Degenerative changes lumbar spine, both SI joints, and both hips. No evidence of erosive arthropathy. No acute bony abnormality. Electronically Signed   By: Marcello Moores  Register   On: 01/09/2016 07:58   Dg Shoulder Right  01/09/2016  CLINICAL DATA:  Chronic shoulder pain. EXAM: RIGHT SHOULDER - 2+ VIEW COMPARISON:  Chest x-ray 02/08/2011. FINDINGS: Right shoulder replacement. Hardware intact. Good anatomic alignment. Degenerative changes noted about the right shoulder. Sclerotic focus is noted in the right glenoid. This could be related to degenerative change and/or prior surgery, however a small blastic lesion cannot be excluded. Diffuse osteopenia. No acute bony abnormality  identified. Prior cervical spine fusion. IMPRESSION: 1. Right shoulder replacement good anatomic alignment. Degenerative changes right shoulder. Diffuse osteopenia. 2. Small sclerotic focus in the right glenoid. This may be degenerative and/or postsurgical. A  small blastic lesion cannot be completely excluded . Electronically Signed   By: Marcello Moores  Register   On: 01/09/2016 08:14   Dg Shoulder Left  01/09/2016  CLINICAL DATA:  Chronic left shoulder pain with limited range of motion, previous left shoulder joint replacement EXAM: LEFT SHOULDER - 2+ VIEW COMPARISON:  MRI of the left shoulder of May 14, 2012 FINDINGS: There is a prosthetic humeral head. The remaining native bone of the humerus there is normal where visualized. The bony glenoid appears intact. The [facility] joint is grossly normal. There is a subacromial spur. The observed portions of the left clavicle and upper left ribs are normal. IMPRESSION: No acute abnormality of the prosthetic left glenohumeral joint. There is no evidence of loosening or infection involving the humeral component. There is a subacromial spur. Electronically Signed   By: David  Martinique M.D.   On: 01/09/2016 07:57   Dg Hip Unilat W Or W/o Pelvis 2-3 Views Left  01/09/2016  ADDENDUM REPORT: 01/09/2016 08:42 ADDENDUM: Right hip series reveals degenerative changes lower lumbar spine, both SI joints, and both hips. Prior L5 laminectomy. No acute bony abnormality identified of the right or left hip. Electronically Signed   By: Marcello Moores  Register   On: 01/09/2016 08:42  01/09/2016  CLINICAL DATA:  Bilateral hip pain.  No known injury. EXAM: DG HIP (WITH OR WITHOUT PELVIS) 2-3V LEFT COMPARISON:  MRI lumbar spine 08/14/2011. Lumbar spine series of 14 2011. FINDINGS: Degenerative changes lumbar spine and both hips. Prior L5 laminectomies. No acute bony abnormality identified. IMPRESSION: Degenerative changes lower lumbar spine, both SI joints, both hips. Prior L5 laminectomies. No acute bony  abnormality identified . Electronically Signed: By: Marcello Moores  Register On: 01/09/2016 08:00    Meds  The patient has a current medication list which includes the following prescription(s): acetaminophen, albuterol, alprazolam, vitamin d3, citalopram, cyanocobalamin, diclofenac sodium, levothyroxine, loratadine, magnesium hydroxide, montelukast, nystatin, oxycodone hcl, proair hfa, spiriva handihaler, voltaren, and naloxone.  Current Outpatient Prescriptions on File Prior to Visit  Medication Sig  . acetaminophen (TYLENOL) 325 MG tablet Take 650 mg by mouth.  Marland Kitchen albuterol (PROAIR HFA) 108 (90 Base) MCG/ACT inhaler Inhale into the lungs.  . ALPRAZolam (XANAX) 1 MG tablet Take 1 mg by mouth 3 (three) times daily as needed.   . Cholecalciferol (VITAMIN D3) 2000 units capsule Take 2,000 Units by mouth daily.   . citalopram (CELEXA) 40 MG tablet Take 40 mg by mouth daily.  . cyanocobalamin (,VITAMIN B-12,) 1000 MCG/ML injection Inject into the muscle.  . diclofenac sodium (VOLTAREN) 1 % GEL Apply topically.  Marland Kitchen levothyroxine (SYNTHROID, LEVOTHROID) 50 MCG tablet TAKE 1 TABLET (50 MCG TOTAL) BY MOUTH DAILY AT 0600.  Marland Kitchen loratadine (CLARITIN) 10 MG tablet TAKE 1 TABLET (10 MG TOTAL) BY MOUTH DAILY.  . magnesium hydroxide (MILK OF MAGNESIA) 400 MG/5ML suspension Take by mouth.  . montelukast (SINGULAIR) 10 MG tablet TAKE 1 TABLET (10 MG TOTAL) BY MOUTH NIGHTLY.  Marland Kitchen nystatin (MYCOSTATIN/NYSTOP) powder Apply topically.  . Oxycodone HCl 10 MG TABS Take 10 mg by mouth every 4 (four) hours as needed. for pain  . PROAIR HFA 108 (90 Base) MCG/ACT inhaler INHALE 2 PUFFS EVERY FOUR (4) HOURS.  Marland Kitchen SPIRIVA HANDIHALER 18 MCG inhalation capsule PLACE 1 CAPSULE (18 MCG TOTAL) INTO INHALER AND INHALE ONCE DAILY.  . VOLTAREN 1 % GEL Reported on 01/02/2016   No current facility-administered medications on file prior to visit.    ROS  Constitutional: Denies any fever or chills  Gastrointestinal: No reported hemesis,  hematochezia, vomiting, or acute GI distress Musculoskeletal: Denies any acute onset joint swelling, redness, loss of ROM, or weakness Neurological: No reported episodes of acute onset apraxia, aphasia, dysarthria, agnosia, amnesia, paralysis, loss of coordination, or loss of consciousness  Allergies  Ms. Silvestro is allergic to aripiprazole; duloxetine; gabapentin; gold; iodinated diagnostic agents; naproxen; naproxen sodium; nortriptyline hcl; nsaids; pregabalin; ace inhibitors; aspirin; cefpodoxime; fluoxetine; fluticasone-salmeterol; levofloxacin; lithium; meperidine; paroxetine; tape; telithromycin; theophylline; topiramate; trazodone; triamcinolone; venlafaxine; bupropion; ketorolac tromethamine; morphine; moxifloxacin; and oxycodone-acetaminophen.  [place]  Medical:  Ms. Albu  has a past medical history of Heart murmur; COPD (chronic obstructive pulmonary disease) ([place]); Asthma; Bell's palsy; Anxiety; Depression; Insomnia; Hiatal hernia; IBS (irritable bowel syndrome); Hepatitis C; Thyroid disease; and Fibromyalgia. Family: family history includes Cancer in her mother; Gout in her mother; Heart disease in her father. Surgical:  has past surgical history that includes necksurgery; Back surgery; Foot surgery (Right); Elbow surgery (Left); Carpal tunnel release (Right); Nose surgery; Partial hysterectomy; and Total shoulder replacement (Bilateral). Tobacco:  reports that she has been smoking Cigarettes.  She has a 40 pack-year smoking history. She does not have any smokeless tobacco history on file. Alcohol:  reports that she drinks alcohol. Drug:  reports that she does not use illicit drugs.  Constitutional Exam  Vitals: Pulse 69, temperature 98.5 F (36.9 C), temperature source Oral, resp. rate 18, height 5\' 5"  (1.651 m), weight 252 lb (114.306 kg), SpO2 99 %. General appearance: Well nourished, well developed, and well hydrated. In no acute distress Calculated BMI/Body habitus: Body mass  index is 41.93 kg/(m^2). (>40 kg/m2) Extreme obesity (Class III) - 254% higher incidence of chronic pain Psych/Mental status: Alert and oriented x 3 (person, place, & time) Eyes: PERLA Respiratory: No evidence of acute respiratory distress  Cervical Spine Exam  Inspection: No masses, redness, or swelling Alignment: Symmetrical ROM: Functional: ROM is within functional limits [facility]) Stability: No instability detected Muscle strength & Tone: Functionally intact Sensory: Unimpaired Palpation: No complaints of tenderness  Upper Extremity (UE) Exam    Side: Right upper extremity  Side: Left upper extremity  Inspection: No masses, redness, swelling, or asymmetry  Inspection: No masses, redness, swelling, or asymmetry  ROM:  ROM:  Functional: ROM is within functional limits [facility])  Functional: ROM is within functional limits [facility])  Muscle strength & Tone: Functionally intact  Muscle strength & Tone: Functionally intact  Sensory: Unimpaired  Sensory: Unimpaired  Palpation: No complaints of tenderness  Palpation: No complaints of tenderness   Thoracic Spine Exam  Inspection: No masses, redness, or swelling Alignment: Symmetrical ROM: Functional: ROM is within functional limits [facility]) Stability: No instability detected Sensory: Unimpaired Muscle strength & Tone: Functionally intact Palpation: No complaints of tenderness  Lumbar Spine Exam  Inspection: No masses, redness, or swelling Alignment: Symmetrical ROM: Functional: ROM is within functional limits [facility]) Stability: No instability detected Muscle strength & Tone: Functionally intact Sensory: Unimpaired Palpation: No complaints of tenderness Provocative Tests: Lumbar Hyperextension and rotation test: deferred Patrick's Maneuver: deferred  Gait & Posture Assessment  Ambulation: Unassisted Gait: Unaffected Posture: WNL  Lower Extremity Exam    Side: Right lower extremity  Side: Left lower extremity  Inspection: No masses,  redness, swelling, or asymmetry ROM:  Inspection: No masses, redness, swelling, or asymmetry ROM:  Functional: ROM is within functional limits [facility])  Functional: ROM is within functional limits [facility])  Muscle strength & Tone: Functionally intact  Muscle strength & Tone: Functionally intact  Sensory: Unimpaired  Sensory: Unimpaired  Palpation: No complaints of tenderness  Palpation: No complaints of tenderness}   Assessment & Plan  Primary Diagnosis & Pertinent Problem List: The primary encounter diagnosis was Chronic shoulder pain (Location of Primary Source of Pain) (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). Diagnoses of Chronic hip pain, unspecified laterality, Chronic low back pain (Location of Tertiary source of pain) (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), History of total shoulder replacement, unspecified laterality, Fibromyalgia, Hx of cervical spine surgery, Lumbar facet syndrome (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), Lumbar spondylosis, unspecified spinal osteoarthritis, Primary osteoarthritis of both hips, Ulnar neuropathy of left upper extremity, Failed back surgical syndrome, Chronic cervical radicular pain (Right), Chronic neck pain (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), Chronic pain of right lower extremity, Opiate use (90 MME/Day), Long term prescription opiate use, Chronic sacroiliac pain (B) (R>L), and Chronic pain were also pertinent to this visit.  Visit Diagnosis: 1. Chronic shoulder pain (Location of Primary Source of Pain) (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)   2. Chronic hip pain, unspecified laterality   3. Chronic low back pain (Location of Tertiary source of pain) (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)   4. History of total shoulder replacement, unspecified laterality   5. Fibromyalgia   6. Hx of cervical spine surgery   7. Lumbar facet syndrome (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)   8. Lumbar spondylosis, unspecified spinal osteoarthritis   9. Primary osteoarthritis of both hips   10. Ulnar neuropathy of left upper extremity   11. Failed back surgical syndrome   12. Chronic cervical radicular pain (Right)    13. Chronic neck pain (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)   14. Chronic pain of right lower extremity   15. Opiate use (90 MME/Day)   16. Long term prescription opiate use   17. Chronic sacroiliac pain (B) (R>L)   18. Chronic pain     Problems updated and reviewed during this visit: Problem  Chronic Pain  Chronic lower extremity pain (Right)    Problem-specific Plan(s): No problem-specific assessment & plan notes found for this encounter.  No new assessment & plan notes have been filed under this hospital service since the last note was generated. Service: Pain Management   Plan of Care   Problem List Items Addressed This Visit      High   Chronic cervical radicular pain (Right) (Chronic)   Relevant Orders   CERVICAL EPIDURAL STEROID INJECTION   Chronic hip pain (Location of Secondary source of pain) (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) (Chronic)   Relevant Orders   HIP INJECTION   Chronic low back pain (Location of Tertiary source of pain) (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) (Chronic)   Relevant Orders   LUMBAR FACET(MEDIAL BRANCH NERVE BLOCK) MBNB   Chronic lower extremity pain (Right) (Chronic)   Relevant Orders   LUMBAR EPIDURAL STEROID INJECTION   Chronic neck pain (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) (Chronic)   Relevant Orders   CERVICAL FACET (MEDIAL BRANCH NERVE BLOCK)    Chronic pain (Chronic)   Chronic sacroiliac pain (B) (R>L) (Chronic)   Relevant Orders   SACROILIAC JOINT INJECTINS   Chronic shoulder pain (Location of Primary Source of Pain) (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) - Primary (Chronic)   Relevant Orders   SUPRASCAPULAR NERVE BLOCK   Failed back surgical syndrome (Chronic)   Relevant Orders   LUMBAR EPIDURAL STEROID INJECTION   Fibromyalgia (Chronic)   History of total shoulder replacement (Bilateral) (Chronic)   Hx of cervical spine surgery (Chronic)   Lumbar facet syndrome (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) (Chronic)   Relevant Orders   LUMBAR FACET(MEDIAL BRANCH NERVE BLOCK) MBNB   Lumbar spondylosis (Chronic)   Relevant Orders   LUMBAR  FACET(MEDIAL BRANCH NERVE BLOCK) MBNB  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) (Chronic)   Relevant Orders   HIP INJECTION   Ulnar neuropathy of left upper extremity (Chronic)     Medium   Long term prescription opiate use (Chronic)   Opiate use (90 MME/Day) (Chronic)   Relevant Medications   naloxone (NARCAN) 2 MG/2ML injection       Pharmacotherapy (Medications Ordered): Meds ordered this encounter  Medications  . naloxone (NARCAN) 2 MG/2ML injection    Sig: Inject content of syringe into thigh muscle. Call 911.    Dispense:  2 Syringe    Refill:  1    NDC # [id number]. Please teach proper use of device.    Lab-work & Procedure Ordered: Orders Placed This Encounter  Procedures  . SUPRASCAPULAR NERVE BLOCK  . HIP INJECTION  . LUMBAR FACET(MEDIAL BRANCH NERVE BLOCK) MBNB  . LUMBAR EPIDURAL STEROID INJECTION  . CERVICAL FACET (MEDIAL BRANCH NERVE BLOCK)   . CERVICAL EPIDURAL STEROID INJECTION  . SACROILIAC JOINT INJECTINS    Imaging Ordered: None  Interventional Therapies: Scheduled:  None at this time.    Considering:   1. Bilateral diagnostic suprascapular nerve block under fluoroscopic guidance and IV sedation.  2. Possible bilateral suprascapular nerve radiofrequency ablation, depending on the results of a diagnostic injection.  3. Diagnostic bilateral intra-articular hip joint injection under fluoroscopic guidance, with a without sedation.  4. Possible bilateral hip joint radiofrequency ablation under fluoroscopic guidance and IV sedation, depending on the results of the diagnostic injection.  5. Diagnostic bilateral lumbar facet block under fluoroscopic guidance and IV sedation.  6. Possible bilateral lumbar facet radiofrequency ablation under fluoroscopic guidance and IV sedation the pending on the results of the diagnostic injection.  7. Diagnostic caudal epidural steroid injection under fluoroscopic guidance + diagnostic epidurogram, with or without  sedation.  8. Possible Racz procedure. 9. Diagnostic bilateral cervical facet block under fluoroscopic guidance and IV sedation.  10. Possible bilateral cervical facet radiofrequency ablation under fluoroscopic guidance and IV sedation.  11. Diagnostic right-sided cervical epidural steroid injection under fluoroscopic guidance, with or without sedation.  12. Diagnostic bilateral sacroiliac joint block under fluoroscopy, with or without sedation. 13. Possible bilateral sacroiliac joint RFA under fluoroscopy and IV sedation.   PRN Procedures:   1. Bilateral diagnostic suprascapular nerve block under fluoroscopic guidance and IV sedation.   2. Diagnostic bilateral intra-articular hip joint injection under fluoroscopic guidance, with a without sedation.   3. Diagnostic bilateral lumbar facet block under fluoroscopic guidance and IV sedation.   4. Diagnostic caudal epidural steroid injection under fluoroscopic guidance + diagnostic epidurogram, with or without sedation.  5. Diagnostic bilateral cervical facet block under fluoroscopic guidance and IV sedation.  6. Diagnostic right-sided cervical epidural steroid injection under fluoroscopic guidance, with or without sedation.  7. Diagnostic bilateral sacroiliac joint block under fluoroscopy, with or without sedation.   Referral(s) or Consult(s): None at this time.  New Prescriptions   NALOXONE (NARCAN) 2 MG/2ML INJECTION    Inject content of syringe into thigh muscle. Call 911.    Medications administered during this visit: Ms. Naqvi does not currently have medications on file.  Requested PM Follow-up: Return for Procedure (PRN - Patient will call).  No future appointments.  Primary Care Physician: [facility] PRIMARY CARE Location: [facility] Note by: Kathlen Brunswick. [name], M.D, DABA, DABAPM, DABPM, DABIPP, FIPP  Pain Score Disclaimer: We use the NRS-11 scale. This is a self-reported, subjective measurement  of pain severity with only modest accuracy. It is  used primarily to identify changes within a particular patient. It must be understood that outpatient pain scales are significantly less accurate that those used for research, where they can be applied under ideal controlled circumstances with minimal exposure to variables. In reality, the score is likely to be a combination of pain intensity and pain affect, where pain affect describes the degree of emotional arousal or changes in action readiness caused by the sensory experience of pain. Factors such as social and work situation, setting, emotional state, anxiety levels, expectation, and prior pain experience may influence pain perception and show large inter-individual differences that may also be affected by time variables.  Patient instructions provided during this appointment: There are no Patient Instructions on file for this visit.

## 2016-01-11 NOTE — Progress Notes (Deleted)
   Subjective:    Patient ID: Danielle [name], female    DOB: 1961/04/07, 55 y.o.   MRN: [id number]  HPI    Review of Systems     Objective:   Physical Exam        Assessment & Plan:

## 2016-01-15 NOTE — Progress Notes (Signed)
Quick Note:  The Test: The urine drug screen (UDS) test used by our facility is a forensic state-of-the-art ultra-high performance liquid chromatography and mass spectrometry system (UPLC/MS-MS), the most sophisticated and accurate method currently available. It is 1,000 times more precise and accurate than standard gas chromatography and mass spectrometry (GC/MS) testing. The system can analyze 26 drug categories and 180 drug compounds. It will detect not only the parent compound but also its break down metabolites. It not only detects the presence of a compound, buts it also provides the amounts, down to nanograms.  The Findings: The findings of this test were reported as abnormal or unexpected, due to inconsistencies with expected results. Expectations are based on the medication history provided by the patient at the time of sample collection.  The Results: The following unreported substance was present: Tramadol These results may suggest the following medication agreement violation(s):  1). Unreported source of medication. (i.e.: "Doctor shopping", illicit sources)  The Determination: No opioid prescribing. ______

## 2016-01-17 ENCOUNTER — Other Ambulatory Visit: Payer: Self-pay

## 2016-01-22 NOTE — Progress Notes (Signed)
Quick Note:   Normal Vitamin B-12 levels are between 211 and 946 pg/mL, for our Lab. Medical conditions that can increase levels of vitamin B12 include liver disease, kidney failure and myeloproliferative disorders, which includes myelocytic leukemia and polycythemia vera.  A normal sedimentation rate should be below 30 mm/hr. The sed rate is an acute phase reactant that indirectly measures the degree of inflammation present in the body. It can be acute, developing rapidly after trauma, injury or infection, for example, or can occur over an extended time (chronic) with conditions such as autoimmune diseases or cancer. The ESR is not diagnostic; it is a non-specific, screening test that may be elevated in a number of these different conditions. It provides general information about the presence or absence of an inflammatory condition.  ______

## 2016-03-01 DIAGNOSIS — J302 Other seasonal allergic rhinitis: Secondary | ICD-10-CM | POA: Insufficient documentation

## 2016-03-01 DIAGNOSIS — Z96611 Presence of right artificial shoulder joint: Secondary | ICD-10-CM | POA: Insufficient documentation

## 2016-03-01 DIAGNOSIS — Z96612 Presence of left artificial shoulder joint: Secondary | ICD-10-CM | POA: Insufficient documentation

## 2016-03-12 ENCOUNTER — Encounter: Payer: Self-pay | Admitting: Pain Medicine

## 2016-03-12 ENCOUNTER — Ambulatory Visit: Payer: Medicaid Other | Attending: Pain Medicine | Admitting: Pain Medicine

## 2016-03-12 VITALS — BP 120/78 | HR 92 | Temp 98.6°F | Resp 16 | Ht 65.0 in | Wt 240.0 lb

## 2016-03-12 DIAGNOSIS — M4722 Other spondylosis with radiculopathy, cervical region: Secondary | ICD-10-CM | POA: Diagnosis not present

## 2016-03-12 DIAGNOSIS — G5622 Lesion of ulnar nerve, left upper limb: Secondary | ICD-10-CM | POA: Insufficient documentation

## 2016-03-12 DIAGNOSIS — K1230 Oral mucositis (ulcerative), unspecified: Secondary | ICD-10-CM | POA: Diagnosis not present

## 2016-03-12 DIAGNOSIS — M160 Bilateral primary osteoarthritis of hip: Secondary | ICD-10-CM | POA: Diagnosis not present

## 2016-03-12 DIAGNOSIS — J449 Chronic obstructive pulmonary disease, unspecified: Secondary | ICD-10-CM | POA: Insufficient documentation

## 2016-03-12 DIAGNOSIS — J309 Allergic rhinitis, unspecified: Secondary | ICD-10-CM | POA: Insufficient documentation

## 2016-03-12 DIAGNOSIS — I10 Essential (primary) hypertension: Secondary | ICD-10-CM | POA: Diagnosis not present

## 2016-03-12 DIAGNOSIS — M25559 Pain in unspecified hip: Secondary | ICD-10-CM | POA: Diagnosis not present

## 2016-03-12 DIAGNOSIS — M4802 Spinal stenosis, cervical region: Secondary | ICD-10-CM | POA: Diagnosis not present

## 2016-03-12 DIAGNOSIS — E669 Obesity, unspecified: Secondary | ICD-10-CM | POA: Insufficient documentation

## 2016-03-12 DIAGNOSIS — M25511 Pain in right shoulder: Secondary | ICD-10-CM | POA: Insufficient documentation

## 2016-03-12 DIAGNOSIS — G9529 Other cord compression: Secondary | ICD-10-CM | POA: Insufficient documentation

## 2016-03-12 DIAGNOSIS — G8929 Other chronic pain: Secondary | ICD-10-CM | POA: Diagnosis not present

## 2016-03-12 DIAGNOSIS — Z79891 Long term (current) use of opiate analgesic: Secondary | ICD-10-CM | POA: Diagnosis not present

## 2016-03-12 DIAGNOSIS — M25551 Pain in right hip: Secondary | ICD-10-CM | POA: Insufficient documentation

## 2016-03-12 DIAGNOSIS — M25552 Pain in left hip: Secondary | ICD-10-CM | POA: Insufficient documentation

## 2016-03-12 DIAGNOSIS — F329 Major depressive disorder, single episode, unspecified: Secondary | ICD-10-CM | POA: Diagnosis not present

## 2016-03-12 DIAGNOSIS — E039 Hypothyroidism, unspecified: Secondary | ICD-10-CM | POA: Diagnosis not present

## 2016-03-12 DIAGNOSIS — M47816 Spondylosis without myelopathy or radiculopathy, lumbar region: Secondary | ICD-10-CM | POA: Insufficient documentation

## 2016-03-12 DIAGNOSIS — Z96612 Presence of left artificial shoulder joint: Secondary | ICD-10-CM | POA: Diagnosis not present

## 2016-03-12 DIAGNOSIS — Z96611 Presence of right artificial shoulder joint: Secondary | ICD-10-CM | POA: Insufficient documentation

## 2016-03-12 DIAGNOSIS — M542 Cervicalgia: Secondary | ICD-10-CM | POA: Insufficient documentation

## 2016-03-12 DIAGNOSIS — F17210 Nicotine dependence, cigarettes, uncomplicated: Secondary | ICD-10-CM | POA: Insufficient documentation

## 2016-03-12 DIAGNOSIS — M25512 Pain in left shoulder: Secondary | ICD-10-CM | POA: Insufficient documentation

## 2016-03-12 DIAGNOSIS — M797 Fibromyalgia: Secondary | ICD-10-CM | POA: Insufficient documentation

## 2016-03-12 DIAGNOSIS — E538 Deficiency of other specified B group vitamins: Secondary | ICD-10-CM | POA: Insufficient documentation

## 2016-03-12 DIAGNOSIS — M533 Sacrococcygeal disorders, not elsewhere classified: Secondary | ICD-10-CM | POA: Insufficient documentation

## 2016-03-12 DIAGNOSIS — M79601 Pain in right arm: Secondary | ICD-10-CM | POA: Diagnosis present

## 2016-03-12 DIAGNOSIS — Z9889 Other specified postprocedural states: Secondary | ICD-10-CM | POA: Insufficient documentation

## 2016-03-12 DIAGNOSIS — R1030 Lower abdominal pain, unspecified: Secondary | ICD-10-CM | POA: Diagnosis not present

## 2016-03-12 MED ORDER — MIDAZOLAM HCL 5 MG/5ML IJ SOLN
INTRAMUSCULAR | Status: AC
  Administered 2016-03-12: 3 mg
  Filled 2016-03-12: qty 5

## 2016-03-12 MED ORDER — MIDAZOLAM HCL 5 MG/5ML IJ SOLN: 1.0000 mg | INTRAMUSCULAR | Status: DC | PRN

## 2016-03-12 MED ORDER — LACTATED RINGERS IV SOLN
1000.0000 mL | Freq: Once | INTRAVENOUS | Status: AC
  Administered 2016-03-12: 1000 mL via INTRAVENOUS

## 2016-03-12 MED ORDER — FENTANYL CITRATE (PF) 100 MCG/2ML IJ SOLN
INTRAMUSCULAR | Status: AC
  Administered 2016-03-12: 100 ug via INTRAVENOUS
  Filled 2016-03-12: qty 2

## 2016-03-12 MED ORDER — METHYLPREDNISOLONE ACETATE 80 MG/ML IJ SUSP
80.0000 mg | Freq: Once | INTRAMUSCULAR | Status: AC
  Administered 2016-03-12: 80 mg

## 2016-03-12 MED ORDER — ROPIVACAINE HCL 2 MG/ML IJ SOLN
9.0000 mL | Freq: Once | INTRAMUSCULAR | Status: AC
  Administered 2016-03-12: 9 mL

## 2016-03-12 MED ORDER — METHYLPREDNISOLONE ACETATE 80 MG/ML IJ SUSP
INTRAMUSCULAR | Status: AC
  Administered 2016-03-12: 15:00:00
  Filled 2016-03-12: qty 1

## 2016-03-12 MED ORDER — LIDOCAINE HCL (PF) 1 % IJ SOLN
10.0000 mL | Freq: Once | INTRAMUSCULAR | Status: AC
  Administered 2016-03-12: 10 mL

## 2016-03-12 MED ORDER — ROPIVACAINE HCL 2 MG/ML IJ SOLN
INTRAMUSCULAR | Status: AC
  Administered 2016-03-12: 15:00:00
  Filled 2016-03-12: qty 10

## 2016-03-12 MED ORDER — FENTANYL CITRATE (PF) 100 MCG/2ML IJ SOLN
25.0000 ug | INTRAMUSCULAR | Status: DC | PRN
Start: 2016-03-12 — End: 2016-04-04

## 2016-03-12 MED ORDER — OXYCODONE HCL 10 MG PO TABS: 10.0000 mg | ORAL_TABLET | Freq: Four times a day (QID) | ORAL | 0 refills | Status: DC | PRN

## 2016-03-12 NOTE — Patient Instructions (Addendum)
Smoking Cessation, Tips for Success If you are ready to quit smoking, congratulations! You have chosen to help yourself be healthier. Cigarettes bring nicotine, tar, carbon monoxide, and other irritants into your body. Your lungs, heart, and blood vessels will be able to work better without these poisons. There are many different ways to quit smoking. Nicotine gum, nicotine patches, a nicotine inhaler, or nicotine nasal spray can help with physical craving. Hypnosis, support groups, and medicines help break the habit of smoking. WHAT THINGS CAN I DO TO MAKE QUITTING EASIER?  Here are some tips to help you quit for good:  Pick a date when you will quit smoking completely. Tell all of your friends and family about your plan to quit on that date.  Do not try to slowly cut down on the number of cigarettes you are smoking. Pick a quit date and quit smoking completely starting on that day.  Throw away all cigarettes.   Clean and remove all ashtrays from your home, work, and car.  On a card, write down your reasons for quitting. Carry the card with you and read it when you get the urge to smoke.  Cleanse your body of nicotine. Drink enough water and fluids to keep your urine clear or pale yellow. Do this after quitting to flush the nicotine from your body.  Learn to predict your moods. Do not let a bad situation be your excuse to have a cigarette. Some situations in your life might tempt you into wanting a cigarette.  Never have "just one" cigarette. It leads to wanting another and another. Remind yourself of your decision to quit.  Change habits associated with smoking. If you smoked while driving or when feeling stressed, try other activities to replace smoking. Stand up when drinking your coffee. Brush your teeth after eating. Sit in a different chair when you read the paper. Avoid alcohol while trying to quit, and try to drink fewer caffeinated beverages. Alcohol and caffeine may urge you to  smoke.  Avoid foods and drinks that can trigger a desire to smoke, such as sugary or spicy foods and alcohol.  Ask people who smoke not to smoke around you.  Have something planned to do right after eating or having a cup of coffee. For example, plan to take a walk or exercise.  Try a relaxation exercise to calm you down and decrease your stress. Remember, you may be tense and nervous for the first 2 weeks after you quit, but this will pass.  Find new activities to keep your hands busy. Play with a pen, coin, or rubber band. Doodle or draw things on paper.  Brush your teeth right after eating. This will help cut down on the craving for the taste of tobacco after meals. You can also try mouthwash.   Use oral substitutes in place of cigarettes. Try using lemon drops, carrots, cinnamon sticks, or chewing gum. Keep them handy so they are available when you have the urge to smoke.  When you have the urge to smoke, try deep breathing.  Designate your home as a nonsmoking area.  If you are a heavy smoker, ask your health care provider about a prescription for nicotine chewing gum. It can ease your withdrawal from nicotine.  Reward yourself. Set aside the cigarette money you save and buy yourself something nice.  Look for support from others. Join a support group or smoking cessation program. Ask someone at home or at work to help you with your plan   to quit smoking.  Always ask yourself, "Do I need this cigarette or is this just a reflex?" Tell yourself, "Today, I choose not to smoke," or "I do not want to smoke." You are reminding yourself of your decision to quit.  Do not replace cigarette smoking with electronic cigarettes (commonly called e-cigarettes). The safety of e-cigarettes is unknown, and some may contain harmful chemicals.  If you relapse, do not give up! Plan ahead and think about what you will do the next time you get the urge to smoke. HOW WILL I FEEL WHEN I QUIT SMOKING? You  may have symptoms of withdrawal because your body is used to nicotine (the addictive substance in cigarettes). You may crave cigarettes, be irritable, feel very hungry, cough often, get headaches, or have difficulty concentrating. The withdrawal symptoms are only temporary. They are strongest when you first quit but will go away within 10-14 days. When withdrawal symptoms occur, stay in control. Think about your reasons for quitting. Remind yourself that these are signs that your body is healing and getting used to being without cigarettes. Remember that withdrawal symptoms are easier to treat than the major diseases that smoking can cause.  Even after the withdrawal is over, expect periodic urges to smoke. However, these cravings are generally short lived and will go away whether you smoke or not. Do not smoke! WHAT RESOURCES ARE AVAILABLE TO HELP ME QUIT SMOKING? Your health care provider can direct you to community resources or hospitals for support, which may include:  Group support.  Education.  Hypnosis.  Therapy.   This information is not intended to replace advice given to you by your health care provider. Make sure you discuss any questions you have with your health care provider.   Document Released: 03/29/2004 Document Revised: 07/22/2014 Document Reviewed: 12/17/2012 Elsevier Interactive Patient Education 2016 [employer].  Trigger Point Injection Trigger points are areas where you have muscle pain. A trigger point injection is a shot given in the trigger point to relieve that pain. A trigger point might feel like a knot in your muscle. It hurts to press on a trigger point. Sometimes the pain spreads out (radiates) to other parts of the body. For example, pressing on a trigger point in your shoulder might cause pain in your arm or neck. You might have one trigger point. Or, you might have more than one. People often have trigger points in their upper back and lower back. They also  occur often in the neck and shoulders. Pain from a trigger point lasts for a long time. It can make it hard to keep moving. You might not be able to do the exercise or physical therapy that could help you deal with the pain. A trigger point injection may help. It does not work for everyone. But, it may relieve your pain for a few days or a few months. A trigger point injection does not cure long-lasting (chronic) pain. LET YOUR CAREGIVER KNOW ABOUT:  Any allergies (especially to latex, lidocaine, or steroids).  Blood-thinning medicines that you take. These drugs can lead to bleeding or bruising after an injection. They include:  Aspirin.  Ibuprofen.  Clopidogrel.  Warfarin.  Other medicines you take. This includes all vitamins, herbs, eyedrops, over-the-counter medicines, and creams.  Use of steroids.  Recent infections.  Past problems with numbing medicines.  Bleeding problems.  Surgeries you have had.  Other health problems. RISKS AND COMPLICATIONS A trigger point injection is a safe treatment. However, problems may  develop, such as:  Minor side effects usually go away in 1 to 2 days. These may include:  Soreness.  Bruising.  Stiffness.  More serious problems are rare. But, they may include:  Bleeding under the skin (hematoma).  Skin infection.  Breaking off of the needle under your skin.  Lung puncture.  The trigger point injection may not work for you. BEFORE THE PROCEDURE You may need to stop taking any medicine that thins your blood. This is to prevent bleeding and bruising. Usually these medicines are stopped several days before the injection. No other preparation is needed. PROCEDURE  A trigger point injection can be given in your caregiver's office or in a clinic. Each injection takes 2 minutes or less.  Your caregiver will feel for trigger points. The caregiver may use a marker to circle the area for the injection.  The skin over the trigger point  will be washed with a germ-killing (antiseptic) solution.  The caregiver pinches the spot for the injection.  Then, a very thin needle is used for the shot. You may feel pain or a twitching feeling when the needle enters the trigger point.  A numbing solution may be injected into the trigger point. Sometimes a drug to keep down swelling, redness, and warmth (inflammation) is also injected.  Your caregiver moves the needle around the trigger zone until the tightness and twitching goes away.  After the injection, your caregiver may put gentle pressure over the injection site.  Then it is covered with a bandage. AFTER THE PROCEDURE  You can go right home after the injection.  The bandage can be taken off after a few hours.  You may feel sore and stiff for 1 to 2 days.  Go back to your regular activities slowly. Your caregiver may ask you to stretch your muscles. Do not do anything that takes extra energy for a few days.  Follow your caregiver's instructions to manage and treat other pain.   This information is not intended to replace advice given to you by your health care provider. Make sure you discuss any questions you have with your health care provider.   Document Released: 06/20/2011 Document Revised: 10/26/2012 Document Reviewed: 06/20/2011 Elsevier Interactive Patient Education 2016 [employer] Pain Management Discharge Instructions  General Discharge Instructions :  If you need to reach your doctor call: Monday-Friday 8:00 am - 4:00 pm at [phone] or toll free [phone].  After clinic hours [phone] to have operator reach doctor.  Bring all of your medication bottles to all your appointments in the pain clinic.  To cancel or reschedule your appointment with Pain Management please remember to call 24 hours in advance to avoid a fee.  Refer to the educational materials which you have been given on: General Risks, I had my Procedure. Discharge Instructions,  Post Sedation.  Post Procedure Instructions:  The drugs you were given will stay in your system until tomorrow, so for the next 24 hours you should not drive, make any legal decisions or drink any alcoholic beverages.  You may eat anything you prefer, but it is better to start with liquids then soups and crackers, and gradually work up to solid foods.  Please notify your doctor immediately if you have any unusual bleeding, trouble breathing or pain that is not related to your normal pain.  Depending on the type of procedure that was done, some parts of your body may feel week and/or numb.  This usually clears up by tonight or  the next day.  Walk with the use of an assistive device or accompanied by an adult for the 24 hours.  You may use ice on the affected area for the first 24 hours.  Put ice in a Ziploc bag and cover with a towel and place against area 15 minutes on 15 minutes off.  You may switch to heat after 24 hours.GENERAL RISKS AND COMPLICATIONS  What are the risk, side effects and possible complications? Generally speaking, most procedures are safe.  However, with any procedure there are risks, side effects, and the possibility of complications.  The risks and complications are dependent upon the sites that are lesioned, or the type of nerve block to be performed.  The closer the procedure is to the spine, the more serious the risks are.  Great care is taken when placing the radio frequency needles, block needles or lesioning probes, but sometimes complications can occur. 1. Infection: Any time there is an injection through the skin, there is a risk of infection.  This is why sterile conditions are used for these blocks.  There are four possible types of infection. 1. Localized skin infection. 2. Central Nervous System Infection-This can be in the form of Meningitis, which can be deadly. 3. Epidural Infections-This can be in the form of an epidural abscess, which can cause pressure  inside of the spine, causing compression of the spinal cord with subsequent paralysis. This would require an emergency surgery to decompress, and there are no guarantees that the patient would recover from the paralysis. 4. Discitis-This is an infection of the intervertebral discs.  It occurs in about 1% of discography procedures.  It is difficult to treat and it may lead to surgery.        2. Pain: the needles have to go through skin and soft tissues, will cause soreness.       3. Damage to internal structures:  The nerves to be lesioned may be near blood vessels or    other nerves which can be potentially damaged.       4. Bleeding: Bleeding is more common if the patient is taking blood thinners such as  aspirin, Coumadin, Ticiid, Plavix, etc., or if he/she have some genetic predisposition  such as hemophilia. Bleeding into the spinal canal can cause compression of the spinal  cord with subsequent paralysis.  This would require an emergency surgery to  decompress and there are no guarantees that the patient would recover from the  paralysis.       5. Pneumothorax:  Puncturing of a lung is a possibility, every time a needle is introduced in  the area of the chest or upper back.  Pneumothorax refers to free air around the  collapsed lung(s), inside of the thoracic cavity (chest cavity).  Another two possible  complications related to a similar event would include: Hemothorax and Chylothorax.   These are variations of the Pneumothorax, where instead of air around the collapsed  lung(s), you may have blood or chyle, respectively.       6. Spinal headaches: They may occur with any procedures in the area of the spine.       7. Persistent CSF (Cerebro-Spinal Fluid) leakage: This is a rare problem, but may occur  with prolonged intrathecal or epidural catheters either due to the formation of a fistulous  track or a dural tear.       8. Nerve damage: By working so close to the spinal cord, there is always  a  possibility of  nerve damage, which could be as serious as a permanent spinal cord injury with  paralysis.       9. Death:  Although rare, severe deadly allergic reactions known as "Anaphylactic  reaction" can occur to any of the medications used.      10. Worsening of the symptoms:  We can always make thing worse.  What are the chances of something like this happening? Chances of any of this occuring are extremely low.  By statistics, you have more of a chance of getting killed in a motor vehicle accident: while driving to the hospital than any of the above occurring .  Nevertheless, you should be aware that they are possibilities.  In general, it is similar to taking a shower.  Everybody knows that you can slip, hit your head and get killed.  Does that mean that you should not shower again?  Nevertheless always keep in mind that statistics do not mean anything if you happen to be on the wrong side of them.  Even if a procedure has a 1 (one) in a 1,000,000 (million) chance of going wrong, it you happen to be that one..Also, keep in mind that by statistics, you have more of a chance of having something go wrong when taking medications.  Who should not have this procedure? If you are on a blood thinning medication (e.g. Coumadin, Plavix, see list of "Blood Thinners"), or if you have an active infection going on, you should not have the procedure.  If you are taking any blood thinners, please inform your physician.  How should I prepare for this procedure?  Do not eat or drink anything at least six hours prior to the procedure.  Bring a driver with you .  It cannot be a taxi.  Come accompanied by an adult that can drive you back, and that is strong enough to help you if your legs get weak or numb from the local anesthetic.  Take all of your medicines the morning of the procedure with just enough water to swallow them.  If you have diabetes, make sure that you are scheduled to have your procedure  done first thing in the morning, whenever possible.  If you have diabetes, take only half of your insulin dose and notify our nurse that you have done so as soon as you arrive at the clinic.  If you are diabetic, but only take blood sugar pills (oral hypoglycemic), then do not take them on the morning of your procedure.  You may take them after you have had the procedure.  Do not take aspirin or any aspirin-containing medications, at least eleven (11) days prior to the procedure.  They may prolong bleeding.  Wear loose fitting clothing that may be easy to take off and that you would not mind if it got stained with Betadine or blood.  Do not wear any jewelry or perfume  Remove any nail coloring.  It will interfere with some of our monitoring equipment.  NOTE: Remember that this is not meant to be interpreted as a complete list of all possible complications.  Unforeseen problems may occur.  BLOOD THINNERS The following drugs contain aspirin or other products, which can cause increased bleeding during surgery and should not be taken for 2 weeks prior to and 1 week after surgery.  If you should need take something for relief of minor pain, you may take acetaminophen which is found in Tylenol,m Datril, Anacin-3 and Panadol.  It is not blood thinner. The products listed below are.  Do not take any of the products listed below in addition to any listed on your instruction sheet.  A.P.C or A.P.C with Codeine Codeine Phosphate Capsules #3 Ibuprofen Ridaura  ABC compound Congesprin Imuran rimadil  Advil Cope Indocin Robaxisal  Alka-Seltzer Effervescent Pain Reliever and Antacid Coricidin or Coricidin-D  Indomethacin Rufen  Alka-Seltzer plus Cold Medicine Cosprin Ketoprofen S-A-C Tablets  Anacin Analgesic Tablets or Capsules Coumadin Korlgesic Salflex  Anacin Extra Strength Analgesic tablets or capsules CP-2 Tablets Lanoril Salicylate  Anaprox Cuprimine Capsules Levenox Salocol  Anexsia-D Dalteparin  Magan Salsalate  Anodynos Darvon compound Magnesium Salicylate Sine-off  Ansaid Dasin Capsules Magsal Sodium Salicylate  Anturane Depen Capsules Marnal Soma  APF Arthritis pain formula Dewitt's Pills Measurin Stanback  Argesic Dia-Gesic Meclofenamic Sulfinpyrazone  Arthritis Bayer Timed Release Aspirin Diclofenac Meclomen Sulindac  Arthritis pain formula Anacin Dicumarol Medipren Supac  Analgesic (Safety coated) Arthralgen Diffunasal Mefanamic Suprofen  Arthritis Strength Bufferin Dihydrocodeine Mepro Compound Suprol  Arthropan liquid Dopirydamole Methcarbomol with Aspirin Synalgos  ASA tablets/Enseals Disalcid Micrainin Tagament  Ascriptin Doan's Midol Talwin  Ascriptin A/D Dolene Mobidin Tanderil  Ascriptin Extra Strength Dolobid Moblgesic Ticlid  Ascriptin with Codeine Doloprin or Doloprin with Codeine Momentum Tolectin  Asperbuf Duoprin Mono-gesic Trendar  Aspergum Duradyne Motrin or Motrin IB Triminicin  Aspirin plain, buffered or enteric coated Durasal Myochrisine Trigesic  Aspirin Suppositories Easprin Nalfon Trillsate  Aspirin with Codeine Ecotrin Regular or Extra Strength Naprosyn Uracel  Atromid-S Efficin Naproxen Ursinus  Auranofin Capsules Elmiron Neocylate Vanquish  Axotal Emagrin Norgesic Verin  Azathioprine Empirin or Empirin with Codeine Normiflo Vitamin E  Azolid Emprazil Nuprin Voltaren  Bayer Aspirin plain, buffered or children's or timed BC Tablets or powders Encaprin Orgaran Warfarin Sodium  Buff-a-Comp Enoxaparin Orudis Zorpin  Buff-a-Comp with Codeine Equegesic Os-Cal-Gesic   Buffaprin Excedrin plain, buffered or Extra Strength Oxalid   Bufferin Arthritis Strength Feldene Oxphenbutazone   Bufferin plain or Extra Strength Feldene Capsules Oxycodone with Aspirin   Bufferin with Codeine Fenoprofen Fenoprofen Pabalate or Pabalate-SF   Buffets II Flogesic Panagesic   Buffinol plain or Extra Strength Florinal or Florinal with Codeine Panwarfarin   Buf-Tabs  Flurbiprofen Penicillamine   Butalbital Compound Four-way cold tablets Penicillin   Butazolidin Fragmin Pepto-Bismol   Carbenicillin Geminisyn Percodan   Carna Arthritis Reliever Geopen Persantine   Carprofen Gold's salt Persistin   Chloramphenicol Goody's Phenylbutazone   Chloromycetin Haltrain Piroxlcam   Clmetidine heparin Plaquenil   Cllnoril Hyco-pap Ponstel   Clofibrate Hydroxy chloroquine Propoxyphen         Before stopping any of these medications, be sure to consult the physician who ordered them.  Some, such as Coumadin (Warfarin) are ordered to prevent or treat serious conditions such as "deep thrombosis", "pumonary embolisms", and other heart problems.  The amount of time that you may need off of the medication may also vary with the medication and the reason for which you were taking it.  If you are taking any of these medications, please make sure you notify your pain physician before you undergo any procedures.         GENERAL RISKS AND COMPLICATIONS  What are the risk, side effects and possible complications? Generally speaking, most procedures are safe.  However, with any procedure there are risks, side effects, and the possibility of complications.  The risks and complications are dependent upon the sites that are lesioned, or the type of nerve block to be performed.  The closer the procedure is to the spine, the more serious the risks are.  Great care is taken when placing the radio frequency needles, block needles or lesioning probes, but sometimes complications can occur. 2. Infection: Any time there is an injection through the skin, there is a risk of infection.  This is why sterile conditions are used for these blocks.  There are four possible types of infection. 1. Localized skin infection. 2. Central Nervous System Infection-This can be in the form of Meningitis, which can be deadly. 3. Epidural Infections-This can be in the form of an epidural abscess, which can  cause pressure inside of the spine, causing compression of the spinal cord with subsequent paralysis. This would require an emergency surgery to decompress, and there are no guarantees that the patient would recover from the paralysis. 4. Discitis-This is an infection of the intervertebral discs.  It occurs in about 1% of discography procedures.  It is difficult to treat and it may lead to surgery.        2. Pain: the needles have to go through skin and soft tissues, will cause soreness.       3. Damage to internal structures:  The nerves to be lesioned may be near blood vessels or    other nerves which can be potentially damaged.       4. Bleeding: Bleeding is more common if the patient is taking blood thinners such as  aspirin, Coumadin, Ticiid, Plavix, etc., or if he/she have some genetic predisposition  such as hemophilia. Bleeding into the spinal canal can cause compression of the spinal  cord with subsequent paralysis.  This would require an emergency surgery to  decompress and there are no guarantees that the patient would recover from the  paralysis.       5. Pneumothorax:  Puncturing of a lung is a possibility, every time a needle is introduced in  the area of the chest or upper back.  Pneumothorax refers to free air around the  collapsed lung(s), inside of the thoracic cavity (chest cavity).  Another two possible  complications related to a similar event would include: Hemothorax and Chylothorax.   These are variations of the Pneumothorax, where instead of air around the collapsed  lung(s), you may have blood or chyle, respectively.       6. Spinal headaches: They may occur with any procedures in the area of the spine.       7. Persistent CSF (Cerebro-Spinal Fluid) leakage: This is a rare problem, but may occur  with prolonged intrathecal or epidural catheters either due to the formation of a fistulous  track or a dural tear.       8. Nerve damage: By working so close to the spinal cord, there is  always a possibility of  nerve damage, which could be as serious as a permanent spinal cord injury with  paralysis.       9. Death:  Although rare, severe deadly allergic reactions known as "Anaphylactic  reaction" can occur to any of the medications used.      10. Worsening of the symptoms:  We can always make thing worse.  What are the chances of something like this happening? Chances of any of this occuring are extremely low.  By statistics, you have more of a chance of getting killed in a motor vehicle accident: while driving to the hospital than any of the above occurring .  Nevertheless, you should be aware that they are  possibilities.  In general, it is similar to taking a shower.  Everybody knows that you can slip, hit your head and get killed.  Does that mean that you should not shower again?  Nevertheless always keep in mind that statistics do not mean anything if you happen to be on the wrong side of them.  Even if a procedure has a 1 (one) in a 1,000,000 (million) chance of going wrong, it you happen to be that one..Also, keep in mind that by statistics, you have more of a chance of having something go wrong when taking medications.  Who should not have this procedure? If you are on a blood thinning medication (e.g. Coumadin, Plavix, see list of "Blood Thinners"), or if you have an active infection going on, you should not have the procedure.  If you are taking any blood thinners, please inform your physician.  How should I prepare for this procedure?  Do not eat or drink anything at least six hours prior to the procedure.  Bring a driver with you .  It cannot be a taxi.  Come accompanied by an adult that can drive you back, and that is strong enough to help you if your legs get weak or numb from the local anesthetic.  Take all of your medicines the morning of the procedure with just enough water to swallow them.  If you have diabetes, make sure that you are scheduled to have your  procedure done first thing in the morning, whenever possible.  If you have diabetes, take only half of your insulin dose and notify our nurse that you have done so as soon as you arrive at the clinic.  If you are diabetic, but only take blood sugar pills (oral hypoglycemic), then do not take them on the morning of your procedure.  You may take them after you have had the procedure.  Do not take aspirin or any aspirin-containing medications, at least eleven (11) days prior to the procedure.  They may prolong bleeding.  Wear loose fitting clothing that may be easy to take off and that you would not mind if it got stained with Betadine or blood.  Do not wear any jewelry or perfume  Remove any nail coloring.  It will interfere with some of our monitoring equipment.  NOTE: Remember that this is not meant to be interpreted as a complete list of all possible complications.  Unforeseen problems may occur.  BLOOD THINNERS The following drugs contain aspirin or other products, which can cause increased bleeding during surgery and should not be taken for 2 weeks prior to and 1 week after surgery.  If you should need take something for relief of minor pain, you may take acetaminophen which is found in Tylenol,m Datril, Anacin-3 and Panadol. It is not blood thinner. The products listed below are.  Do not take any of the products listed below in addition to any listed on your instruction sheet.  A.P.C or A.P.C with Codeine Codeine Phosphate Capsules #3 Ibuprofen Ridaura  ABC compound Congesprin Imuran rimadil  Advil Cope Indocin Robaxisal  Alka-Seltzer Effervescent Pain Reliever and Antacid Coricidin or Coricidin-D  Indomethacin Rufen  Alka-Seltzer plus Cold Medicine Cosprin Ketoprofen S-A-C Tablets  Anacin Analgesic Tablets or Capsules Coumadin Korlgesic Salflex  Anacin Extra Strength Analgesic tablets or capsules CP-2 Tablets Lanoril Salicylate  Anaprox Cuprimine Capsules Levenox Salocol  Anexsia-D  Dalteparin Magan Salsalate  Anodynos Darvon compound Magnesium Salicylate Sine-off  Ansaid Dasin Capsules Magsal Sodium Salicylate  Anturane  Depen Capsules Marnal Soma  APF Arthritis pain formula Dewitt's Pills Measurin Stanback  Argesic Dia-Gesic Meclofenamic Sulfinpyrazone  Arthritis Bayer Timed Release Aspirin Diclofenac Meclomen Sulindac  Arthritis pain formula Anacin Dicumarol Medipren Supac  Analgesic (Safety coated) Arthralgen Diffunasal Mefanamic Suprofen  Arthritis Strength Bufferin Dihydrocodeine Mepro Compound Suprol  Arthropan liquid Dopirydamole Methcarbomol with Aspirin Synalgos  ASA tablets/Enseals Disalcid Micrainin Tagament  Ascriptin Doan's Midol Talwin  Ascriptin A/D Dolene Mobidin Tanderil  Ascriptin Extra Strength Dolobid Moblgesic Ticlid  Ascriptin with Codeine Doloprin or Doloprin with Codeine Momentum Tolectin  Asperbuf Duoprin Mono-gesic Trendar  Aspergum Duradyne Motrin or Motrin IB Triminicin  Aspirin plain, buffered or enteric coated Durasal Myochrisine Trigesic  Aspirin Suppositories Easprin Nalfon Trillsate  Aspirin with Codeine Ecotrin Regular or Extra Strength Naprosyn Uracel  Atromid-S Efficin Naproxen Ursinus  Auranofin Capsules Elmiron Neocylate Vanquish  Axotal Emagrin Norgesic Verin  Azathioprine Empirin or Empirin with Codeine Normiflo Vitamin E  Azolid Emprazil Nuprin Voltaren  Bayer Aspirin plain, buffered or children's or timed BC Tablets or powders Encaprin Orgaran Warfarin Sodium  Buff-a-Comp Enoxaparin Orudis Zorpin  Buff-a-Comp with Codeine Equegesic Os-Cal-Gesic   Buffaprin Excedrin plain, buffered or Extra Strength Oxalid   Bufferin Arthritis Strength Feldene Oxphenbutazone   Bufferin plain or Extra Strength Feldene Capsules Oxycodone with Aspirin   Bufferin with Codeine Fenoprofen Fenoprofen Pabalate or Pabalate-SF   Buffets II Flogesic Panagesic   Buffinol plain or Extra Strength Florinal or Florinal with Codeine Panwarfarin    Buf-Tabs Flurbiprofen Penicillamine   Butalbital Compound Four-way cold tablets Penicillin   Butazolidin Fragmin Pepto-Bismol   Carbenicillin Geminisyn Percodan   Carna Arthritis Reliever Geopen Persantine   Carprofen Gold's salt Persistin   Chloramphenicol Goody's Phenylbutazone   Chloromycetin Haltrain Piroxlcam   Clmetidine heparin Plaquenil   Cllnoril Hyco-pap Ponstel   Clofibrate Hydroxy chloroquine Propoxyphen         Before stopping any of these medications, be sure to consult the physician who ordered them.  Some, such as Coumadin (Warfarin) are ordered to prevent or treat serious conditions such as "deep thrombosis", "pumonary embolisms", and other heart problems.  The amount of time that you may need off of the medication may also vary with the medication and the reason for which you were taking it.  If you are taking any of these medications, please make sure you notify your pain physician before you undergo any procedures.         Pain Management Discharge Instructions  General Discharge Instructions :  If you need to reach your doctor call: Monday-Friday 8:00 am - 4:00 pm at [phone] or toll free [phone].  After clinic hours [phone] to have operator reach doctor.  Bring all of your medication bottles to all your appointments in the pain clinic.  To cancel or reschedule your appointment with Pain Management please remember to call 24 hours in advance to avoid a fee.  Refer to the educational materials which you have been given on: General Risks, I had my Procedure. Discharge Instructions, Post Sedation.  Post Procedure Instructions:  The drugs you were given will stay in your system until tomorrow, so for the next 24 hours you should not drive, make any legal decisions or drink any alcoholic beverages.  You may eat anything you prefer, but it is better to start with liquids then soups and crackers, and gradually work up to solid foods.  Please  notify your doctor immediately if you have any unusual bleeding, trouble breathing or pain that is not  related to your normal pain.  Depending on the type of procedure that was done, some parts of your body may feel week and/or numb.  This usually clears up by tonight or the next day.  Walk with the use of an assistive device or accompanied by an adult for the 24 hours.  You may use ice on the affected area for the first 24 hours.  Put ice in a Ziploc bag and cover with a towel and place against area 15 minutes on 15 minutes off.  You may switch to heat after 24 hours.

## 2016-03-12 NOTE — Progress Notes (Signed)
Patient's Name: Danielle [name]  Patient type: Established  MRN: [id number]  Service setting: Ambulatory outpatient  DOB: 12/25/1960  Location: [facility]  DOS: 03/12/2016  Primary Care Physician: [facility]  Note by: Beatriz Chancellor A. [name], M.D, DABA, DABAPM, DABPM, Milagros Evener, FIPP  Referring Physician: Milinda Pointer, MD  Specialty: Board-Certified Interventional Pain Management  Last Visit to Pain Management: 01/11/2016   Primary Reason(s) for Visit: Interventional Pain Management Treatment. CC: Hip Pain (right) and Arm Pain (right)  Chronic hip pain, unspecified laterality [M25.559, G89.29]   Procedure:  Anesthesia, Analgesia, Anxiolysis:  Type: Diagnostic Intra-Articular Hip Injection Region:  Posterolateral hip joint area. Level: Lower pelvic and hip joint level. Laterality: Right  Indications: 1. Chronic hip pain, unspecified laterality   2. Primary osteoarthritis of both hips   3. Chronic pain     Pre-procedure Pain Score: 5/10 Reported level of pain is compatible with clinical observations Post-procedure Pain Score: 5   Type: Moderate (Conscious) Sedation & Local Anesthesia Local Anesthetic: Lidocaine 1% Route: Intravenous (IV) IV Access: Secured Sedation: Meaningful verbal contact was maintained at all times during the procedure  Indication(s): Analgesia & Anxiolysis   Pre-Procedure Assessment  Danielle [name] is a 55 y.o. year old, female patient, seen today for interventional treatment. She has Chronic pain; Long term current use of opiate analgesic; Long term prescription opiate use; Opiate use (90 MME/Day); B12 deficiency; Cervical spinal cord compression [facility]) (April, 2017); Cervical spinal stenosis; Chronic obstructive pulmonary disease ([place]); Clinical depression; BP (high blood pressure); Adult hypothyroidism; Adiposity; Mucositis oral; Groin pain; Current tobacco use; Chronic shoulder pain (Location of Primary Source of Pain)  (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); History of total shoulder replacement (Bilateral); Chronic hip pain (Location of Secondary source of pain) (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); 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); Chronic low back pain (Location of Tertiary source of pain) (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); Lumbar facet syndrome (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); Lumbar spondylosis; Failed back surgical syndrome; Chronic lower extremity pain (Right); Chronic neck pain (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); Chronic cervical radicular pain (Right); Ulnar neuropathy of left upper extremity; Hx of cervical spine surgery; Cervical spondylosis; Encounter for therapeutic drug level monitoring; Encounter for pain management planning; Fibromyalgia; Chronic sacroiliac pain (B) (R>L); Seasonal allergic rhinitis; and Status post total replacement of right shoulder on her problem list.. Her primarily concern today is the Hip Pain (right) and Arm Pain (right)   Pain Type: Chronic pain Pain Location: Hip (arm) Pain Orientation: Right Pain Descriptors / Indicators: Throbbing Pain Frequency: Constant  Date of Last Visit: 01/11/16 Service Provided on Last Visit: Evaluation  Coagulation Parameters No results found for: INR, LABPROT, APTT, PLT  Verification of the correct person, correct site (including marking of site), and correct procedure were performed and confirmed by the patient.  Consent: Secured. Under the influence of no sedatives a written informed consent was obtained, after having provided information on the risks and possible complications. To fulfill our ethical and legal obligations, as recommended by the American Medical Association's Code of Ethics, we have provided information to the patient about our clinical impression; the nature and purpose of the treatment or procedure; the risks, benefits, and possible complications of the intervention; alternatives; the risk(s) and benefit(s) of the alternative treatment(s) or procedure(s); and the risk(s) and benefit(s) of doing  nothing. The patient was provided information about the risks and possible complications associated with the procedure. These include, but are not limited to, failure to achieve desired goals, infection, bleeding, organ or nerve damage, allergic reactions, paralysis, and death. In the  case of intra- or periarticular procedures these may include, but are not limited to, failure to achieve desired goals, infection, bleeding (hemarthrosis), organ or nerve damage, allergic reactions, and death. In addition, the patient was informed that Medicine is not an exact science; therefore, there is also the possibility of unforeseen risks and possible complications that may result in a catastrophic outcome. The patient indicated having understood very clearly. We have given the patient no guarantees and we have made no promises. Enough time was given to the patient to ask questions, all of which were answered to the patient's satisfaction.  Consent Attestation: I, the ordering provider, attest that I have discussed with the patient the benefits, risks, side-effects, alternatives, likelihood of achieving goals, and potential problems during recovery for the procedure that I have provided informed consent.  Pre-Procedure Preparation: Safety Precautions: Allergies reviewed. Appropriate site, procedure, and patient were confirmed by following the Joint Commission's Universal Protocol (UP.01.01.01), in the form of a "Time Out". The patient was asked to confirm marked site and procedure, before commencing. The patient was asked about blood thinners, or active infections, both of which were denied. Patient was assessed for positional comfort and all pressure points were checked before starting procedure. Allergies: She is allergic to aripiprazole; duloxetine; gabapentin; gold; iodinated diagnostic agents; naproxen sodium; nortriptyline hcl; nsaids; pregabalin; ace inhibitors; aspirin; cefpodoxime; fluoxetine;  fluticasone-salmeterol; levofloxacin; lithium; meperidine; paroxetine; tape; telithromycin; theophylline; topiramate; trazodone; triamcinolone; venlafaxine; bupropion; ketorolac tromethamine; morphine; and moxifloxacin.. Infection Control Precautions: Sterile technique used. Standard Universal Precautions were taken as recommended by the Department of [facility] for Disease Control and Prevention (CDC). Standard pre-surgical skin prep was conducted. Respiratory hygiene and cough etiquette was practiced. Hand hygiene observed. Safe injection practices and needle disposal techniques followed. SDV (single dose vial) medications used. Medications properly checked for expiration dates and contaminants. Personal protective equipment (PPE) used: Sterile Radiation-resistant gloves. Monitoring:  As per clinic protocol. Vitals:   03/12/16 1508 03/12/16 1518 03/12/16 1528 03/12/16 1538  BP: 125/81 130/63 130/82 120/78  Pulse:      Resp: '12 14 16 16  ' Temp:      SpO2: 94% 96% 98% 95%  Weight:      Height:      Calculated BMI: Body mass index is 39.94 kg/m.  Description of Procedure Process:   Time-out: "Time-out" completed before starting procedure, as per protocol. Position: Prone Target Area: Superior aspect of the hip joint cavity, going thru the superior portion of the capsular ligament. Approach: Posterolateral approach. Area Prepped: Entire Posterolateral hip area. Prepping solution: ChloraPrep (2% chlorhexidine gluconate and 70% isopropyl alcohol) Safety Precautions: Aspiration looking for blood return was conducted prior to all injections. At no point did we inject any substances, as a needle was being advanced. No attempts were made at seeking any paresthesias. Safe injection practices and needle disposal techniques used. Medications properly checked for expiration dates. SDV (single dose vial) medications used. Contrast Allergy precautions taken.   Description of the Procedure:  Protocol guidelines were followed. The patient was placed in position over the fluoroscopy table. The target area was identified and the area prepped in the usual manner. Skin & deeper tissues infiltrated with local anesthetic. Appropriate amount of time allowed to pass for local anesthetics to take effect. The procedure needles were then advanced to the target area. Proper needle placement secured. Negative aspiration confirmed. Solution injected in intermittent fashion, asking for systemic symptoms every 0.5cc of injectate. The needles were then removed and the area cleansed,  making sure to leave some of the prepping solution back to take advantage of its long term bactericidal properties. EBL: Minimal Materials & Medications Used:  Needle(s) Used: 22g - 7" Spinal Needle(s) Solution Injected: 0.2% PF-Ropivacaine (36m) + SDV-DepoMedrol 80 mg/ml (15m Medications Administered today: We administered lactated ringers, methylPREDNISolone acetate, lidocaine (PF), ropivacaine (PF) 2 mg/ml (0.2%), ropivacaine (PF) 2 mg/ml (0.2%), methylPREDNISolone acetate, fentaNYL, and midazolam.Please see chart orders for dosing details.  Imaging Guidance:   Type of Imaging Technique: Fluoroscopy Guidance (Non-spinal) Indication(s): Assistance in needle guidance and placement for procedures requiring needle placement in or near specific anatomical locations not easily accessible without such assistance. Exposure Time: Please see nurses notes. Contrast: Patient allergic to contrast dye. None used. Fluoroscopic Guidance: I was personally present in the fluoroscopy suite, where the patient was placed in position for the procedure, over the fluoroscopy-compatible table. Fluoroscopy was manipulated, using "Tunnel Vision Technique", to obtain the best possible view of the target area, on the affected side. Parallax error was corrected before commencing the procedure. A "direction-depth-direction" technique was used to introduce  the needle under continuous pulsed fluoroscopic guidance. Once the target was reached, antero-posterior, oblique, and lateral fluoroscopic projection views were taken to confirm needle placement in all planes. Permanently recorded images stored by scanning into EMR. Interpretation: No contrast injected. Intraoperative imaging interpretation by performing Physician. Adequate needle placement confirmed in AP, Lateral, & Oblique Views. Permanent images scanned into the patient's record.  Antibiotic Prophylaxis:  Indication(s): No indications identified. Type:  Antibiotics Given (last 72 hours)    None       Post-operative Assessment:   Complications: No immediate post-treatment complications were observed. Disposition: The patient was discharged home, once institutional criteria were met. Return to clinic in 2 weeks for follow-up evaluation and interpretation of results. The patient tolerated the entire procedure well. A repeat set of vitals were taken after the procedure and the patient was kept under observation following institutional policy, for this type of procedure. Post-procedural neurological assessment was performed, showing return to baseline, prior to discharge. The patient was provided with post-procedure discharge instructions, including a section on how to identify potential problems. Should any problems arise concerning this procedure, the patient was given instructions to immediately contact us[place]oreaat any time, without hesitation. In any case, we plan to contact the patient by telephone for a follow-up status report regarding this interventional procedure. Comments:  No additional relevant information.  Plan of Care   Problem List Items Addressed This Visit      High   Chronic hip pain (Location of Secondary source of pain) (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) - Primary (Chronic)   Relevant Medications   fentaNYL (SUBLIMAZE) injection 25-50 mcg   lactated ringers infusion 1,000 mL (Completed)    midazolam (VERSED) 5 MG/5ML injection 1-2 mg   methylPREDNISolone acetate (DEPO-MEDROL) injection 80 mg (Completed)   lidocaine (PF) (XYLOCAINE) 1 % injection 10 mL (Completed)   ropivacaine (PF) 2 mg/ml (0.2%) (NAROPIN) epidural 9 mL (Completed)   ropivacaine (PF) 2 mg/ml (0.2%) (NAROPIN) 2 MG/ML epidural (Completed)   methylPREDNISolone acetate (DEPO-MEDROL) 80 MG/ML injection (Completed)   fentaNYL (SUBLIMAZE) 100 MCG/2ML injection (Completed)    Chronic pain (Chronic)   Relevant Medications   Oxycodone HCl 10 MG TABS   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) (Chronic)   Relevant Medications   fentaNYL (SUBLIMAZE) injection 25-50 mcg   methylPREDNISolone acetate (DEPO-MEDROL) injection 80 mg (Completed)   methylPREDNISolone acetate (DEPO-MEDROL) 80 MG/ML injection (Completed)   fentaNYL (SUBLIMAZE) 100  MCG/2ML injection (Completed)     Requested PM Follow-up: Return in 2 weeks (on 03/25/2016) for Post-Procedure evaluation.  Future Appointments Date Time Provider [place]  04/04/2016 9:20 AM Milinda Pointer, MD [facility] None    Primary Care Physician: [facility] PR[facility] Location: [facility] Note by: Kathlen Brunswick. [name], M.D, DABA, DABAPM, DABPM, DABIPP, FIPP  Disclaimer:  Medicine is not an exact science. The only guarantee in medicine is that nothing is guaranteed. It is important to note that the decision to proceed with this intervention was based on the information collected from the patient. The Data and conclusions were drawn from the patient's questionnaire, the interview, and the physical examination. Because the information was provided in large part by the patient, it cannot be guaranteed that it has not been purposely or unconsciously manipulated. Every effort has been made to obtain as much relevant data as possible for this evaluation. It is important to note that the conclusions that lead to this procedure are derived in large  part from the available data. Always take into account that the treatment will also be dependent on availability of resources and existing treatment guidelines, considered by other Pain Management Practitioners as being common knowledge and practice, at the time of the intervention. For Medico-Legal purposes, it is also important to point out that variation in procedural techniques and pharmacological choices are the acceptable norm. The indications, contraindications, technique, and results of the above procedure should only be interpreted and judged by a Board-Certified Interventional Pain Specialist with extensive familiarity and expertise in the same exact procedure and technique. Attempts at providing opinions without similar or greater experience and expertise than that of the treating physician will be considered as inappropriate and unethical, and shall result in a formal complaint to the state medical board and applicable specialty societies.

## 2016-03-13 ENCOUNTER — Telehealth: Payer: Self-pay | Admitting: Pain Medicine

## 2016-03-13 NOTE — Telephone Encounter (Signed)
Patient went to pick up medicine that was called in to [facility] and they told her it would need prior auth. Please check on this and let patient know, she says she had a rough night after procedure

## 2016-03-13 NOTE — Telephone Encounter (Signed)
Notified patient that PA sent.  Left message

## 2016-04-04 ENCOUNTER — Ambulatory Visit: Payer: Medicaid Other | Attending: Pain Medicine | Admitting: Pain Medicine

## 2016-04-04 ENCOUNTER — Encounter: Payer: Self-pay | Admitting: Pain Medicine

## 2016-04-04 VITALS — BP 135/78 | HR 93 | Temp 98.6°F | Resp 18 | Ht 64.0 in | Wt 220.0 lb

## 2016-04-04 DIAGNOSIS — Z8249 Family history of ischemic heart disease and other diseases of the circulatory system: Secondary | ICD-10-CM | POA: Diagnosis not present

## 2016-04-04 DIAGNOSIS — E669 Obesity, unspecified: Secondary | ICD-10-CM | POA: Insufficient documentation

## 2016-04-04 DIAGNOSIS — M25512 Pain in left shoulder: Secondary | ICD-10-CM | POA: Diagnosis not present

## 2016-04-04 DIAGNOSIS — Z885 Allergy status to narcotic agent status: Secondary | ICD-10-CM | POA: Insufficient documentation

## 2016-04-04 DIAGNOSIS — G8929 Other chronic pain: Secondary | ICD-10-CM | POA: Diagnosis not present

## 2016-04-04 DIAGNOSIS — M797 Fibromyalgia: Secondary | ICD-10-CM | POA: Insufficient documentation

## 2016-04-04 DIAGNOSIS — Z79891 Long term (current) use of opiate analgesic: Secondary | ICD-10-CM | POA: Diagnosis not present

## 2016-04-04 DIAGNOSIS — F1190 Opioid use, unspecified, uncomplicated: Secondary | ICD-10-CM

## 2016-04-04 DIAGNOSIS — Z888 Allergy status to other drugs, medicaments and biological substances status: Secondary | ICD-10-CM | POA: Insufficient documentation

## 2016-04-04 DIAGNOSIS — M25552 Pain in left hip: Secondary | ICD-10-CM | POA: Insufficient documentation

## 2016-04-04 DIAGNOSIS — M25551 Pain in right hip: Secondary | ICD-10-CM | POA: Diagnosis present

## 2016-04-04 DIAGNOSIS — E039 Hypothyroidism, unspecified: Secondary | ICD-10-CM | POA: Insufficient documentation

## 2016-04-04 DIAGNOSIS — F17210 Nicotine dependence, cigarettes, uncomplicated: Secondary | ICD-10-CM | POA: Insufficient documentation

## 2016-04-04 DIAGNOSIS — E538 Deficiency of other specified B group vitamins: Secondary | ICD-10-CM | POA: Insufficient documentation

## 2016-04-04 DIAGNOSIS — Z881 Allergy status to other antibiotic agents status: Secondary | ICD-10-CM | POA: Insufficient documentation

## 2016-04-04 DIAGNOSIS — Z96611 Presence of right artificial shoulder joint: Secondary | ICD-10-CM | POA: Insufficient documentation

## 2016-04-04 DIAGNOSIS — M25511 Pain in right shoulder: Secondary | ICD-10-CM | POA: Diagnosis not present

## 2016-04-04 DIAGNOSIS — Z91041 Radiographic dye allergy status: Secondary | ICD-10-CM | POA: Insufficient documentation

## 2016-04-04 DIAGNOSIS — Z8349 Family history of other endocrine, nutritional and metabolic diseases: Secondary | ICD-10-CM | POA: Diagnosis not present

## 2016-04-04 DIAGNOSIS — M542 Cervicalgia: Secondary | ICD-10-CM | POA: Insufficient documentation

## 2016-04-04 DIAGNOSIS — Z96612 Presence of left artificial shoulder joint: Secondary | ICD-10-CM | POA: Insufficient documentation

## 2016-04-04 DIAGNOSIS — J449 Chronic obstructive pulmonary disease, unspecified: Secondary | ICD-10-CM | POA: Diagnosis not present

## 2016-04-04 DIAGNOSIS — Z6837 Body mass index (BMI) 37.0-37.9, adult: Secondary | ICD-10-CM | POA: Diagnosis not present

## 2016-04-04 MED ORDER — OXYCODONE HCL 10 MG PO TABS: 10.0000 mg | ORAL_TABLET | Freq: Four times a day (QID) | ORAL | 0 refills | Status: DC | PRN

## 2016-04-04 NOTE — Patient Instructions (Addendum)
Smoking Cessation, Tips for Success If you are ready to quit smoking, congratulations! You have chosen to help yourself be healthier. Cigarettes bring nicotine, tar, carbon monoxide, and other irritants into your body. Your lungs, heart, and blood vessels will be able to work better without these poisons. There are many different ways to quit smoking. Nicotine gum, nicotine patches, a nicotine inhaler, or nicotine nasal spray can help with physical craving. Hypnosis, support groups, and medicines help break the habit of smoking. WHAT THINGS CAN I DO TO MAKE QUITTING EASIER?  Here are some tips to help you quit for good:  Pick a date when you will quit smoking completely. Tell all of your friends and family about your plan to quit on that date.  Do not try to slowly cut down on the number of cigarettes you are smoking. Pick a quit date and quit smoking completely starting on that day.  Throw away all cigarettes.   Clean and remove all ashtrays from your home, work, and car.  On a card, write down your reasons for quitting. Carry the card with you and read it when you get the urge to smoke.  Cleanse your body of nicotine. Drink enough water and fluids to keep your urine clear or pale yellow. Do this after quitting to flush the nicotine from your body.  Learn to predict your moods. Do not let a bad situation be your excuse to have a cigarette. Some situations in your life might tempt you into wanting a cigarette.  Never have "just one" cigarette. It leads to wanting another and another. Remind yourself of your decision to quit.  Change habits associated with smoking. If you smoked while driving or when feeling stressed, try other activities to replace smoking. Stand up when drinking your coffee. Brush your teeth after eating. Sit in a different chair when you read the paper. Avoid alcohol while trying to quit, and try to drink fewer caffeinated beverages. Alcohol and caffeine may urge you to  smoke.  Avoid foods and drinks that can trigger a desire to smoke, such as sugary or spicy foods and alcohol.  Ask people who smoke not to smoke around you.  Have something planned to do right after eating or having a cup of coffee. For example, plan to take a walk or exercise.  Try a relaxation exercise to calm you down and decrease your stress. Remember, you may be tense and nervous for the first 2 weeks after you quit, but this will pass.  Find new activities to keep your hands busy. Play with a pen, coin, or rubber band. Doodle or draw things on paper.  Brush your teeth right after eating. This will help cut down on the craving for the taste of tobacco after meals. You can also try mouthwash.   Use oral substitutes in place of cigarettes. Try using lemon drops, carrots, cinnamon sticks, or chewing gum. Keep them handy so they are available when you have the urge to smoke.  When you have the urge to smoke, try deep breathing.  Designate your home as a nonsmoking area.  If you are a heavy smoker, ask your health care provider about a prescription for nicotine chewing gum. It can ease your withdrawal from nicotine.  Reward yourself. Set aside the cigarette money you save and buy yourself something nice.  Look for support from others. Join a support group or smoking cessation program. Ask someone at home or at work to help you with your plan   to quit smoking.  Always ask yourself, "Do I need this cigarette or is this just a reflex?" Tell yourself, "Today, I choose not to smoke," or "I do not want to smoke." You are reminding yourself of your decision to quit.  Do not replace cigarette smoking with electronic cigarettes (commonly called e-cigarettes). The safety of e-cigarettes is unknown, and some may contain harmful chemicals.  If you relapse, do not give up! Plan ahead and think about what you will do the next time you get the urge to smoke. HOW WILL I FEEL WHEN I QUIT SMOKING? You  may have symptoms of withdrawal because your body is used to nicotine (the addictive substance in cigarettes). You may crave cigarettes, be irritable, feel very hungry, cough often, get headaches, or have difficulty concentrating. The withdrawal symptoms are only temporary. They are strongest when you first quit but will go away within 10-14 days. When withdrawal symptoms occur, stay in control. Think about your reasons for quitting. Remind yourself that these are signs that your body is healing and getting used to being without cigarettes. Remember that withdrawal symptoms are easier to treat than the major diseases that smoking can cause.  Even after the withdrawal is over, expect periodic urges to smoke. However, these cravings are generally short lived and will go away whether you smoke or not. Do not smoke! WHAT RESOURCES ARE AVAILABLE TO HELP ME QUIT SMOKING? Your health care provider can direct you to community resources or hospitals for support, which may include:  Group support.  Education.  Hypnosis.  Therapy.   This information is not intended to replace advice given to you by your health care provider. Make sure you discuss any questions you have with your health care provider.   Document Released: 03/29/2004 Document Revised: 07/22/2014 Document Reviewed: 12/17/2012 Elsevier Interactive Patient Education 2016 [employer] TPI Trigger Point Injection Trigger points are areas where you have muscle pain. A trigger point injection is a shot given in the trigger point to relieve that pain. A trigger point might feel like a knot in your muscle. It hurts to press on a trigger point. Sometimes the pain spreads out (radiates) to other parts of the body. For example, pressing on a trigger point in your shoulder might cause pain in your arm or neck. You might have one trigger point. Or, you might have more than one. People often have trigger points in their upper back and lower back. They also  occur often in the neck and shoulders. Pain from a trigger point lasts for a long time. It can make it hard to keep moving. You might not be able to do the exercise or physical therapy that could help you deal with the pain. A trigger point injection may help. It does not work for everyone. But, it may relieve your pain for a few days or a few months. A trigger point injection does not cure long-lasting (chronic) pain. LET YOUR CAREGIVER KNOW ABOUT:  Any allergies (especially to latex, lidocaine, or steroids).  Blood-thinning medicines that you take. These drugs can lead to bleeding or bruising after an injection. They include:  Aspirin.  Ibuprofen.  Clopidogrel.  Warfarin.  Other medicines you take. This includes all vitamins, herbs, eyedrops, over-the-counter medicines, and creams.  Use of steroids.  Recent infections.  Past problems with numbing medicines.  Bleeding problems.  Surgeries you have had.  Other health problems. RISKS AND COMPLICATIONS A trigger point injection is a safe treatment. However, problems may  develop, such as:  Minor side effects usually go away in 1 to 2 days. These may include:  Soreness.  Bruising.  Stiffness.  More serious problems are rare. But, they may include:  Bleeding under the skin (hematoma).  Skin infection.  Breaking off of the needle under your skin.  Lung puncture.  The trigger point injection may not work for you. BEFORE THE PROCEDURE You may need to stop taking any medicine that thins your blood. This is to prevent bleeding and bruising. Usually these medicines are stopped several days before the injection. No other preparation is needed. PROCEDURE  A trigger point injection can be given in your caregiver's office or in a clinic. Each injection takes 2 minutes or less.  Your caregiver will feel for trigger points. The caregiver may use a marker to circle the area for the injection.  The skin over the trigger point  will be washed with a germ-killing (antiseptic) solution.  The caregiver pinches the spot for the injection.  Then, a very thin needle is used for the shot. You may feel pain or a twitching feeling when the needle enters the trigger point.  A numbing solution may be injected into the trigger point. Sometimes a drug to keep down swelling, redness, and warmth (inflammation) is also injected.  Your caregiver moves the needle around the trigger zone until the tightness and twitching goes away.  After the injection, your caregiver may put gentle pressure over the injection site.  Then it is covered with a bandage. AFTER THE PROCEDURE  You can go right home after the injection.  The bandage can be taken off after a few hours.  You may feel sore and stiff for 1 to 2 days.  Go back to your regular activities slowly. Your caregiver may ask you to stretch your muscles. Do not do anything that takes extra energy for a few days.  Follow your caregiver's instructions to manage and treat other pain.   This information is not intended to replace advice given to you by your health care provider. Make sure you discuss any questions you have with your health care provider.   Document Released: 06/20/2011 Document Revised: 10/26/2012 Document Reviewed: 06/20/2011 Elsevier Interactive Patient Education [employer].

## 2016-04-04 NOTE — Progress Notes (Signed)
Safety precautions to be maintained throughout the outpatient stay will include: orient to surroundings, keep bed in low position, maintain call bell within reach at all times, provide assistance with transfer out of bed and ambulation.  Bottle labeled oxycodone 10 mg count #5/60  Filled 03-19-16

## 2016-04-04 NOTE — Progress Notes (Signed)
Patient's Name: Danielle [name]  MRN: [id number]  Referring Provider: Care, [place] [facility]  DOB: April 06, 1961  PCP: [facility]  DOS: 04/04/2016  Note by: Kathlen Brunswick. [name], MD  Service setting: Ambulatory outpatient  Specialty: Interventional Pain Management  Location: [facility] (AMB) Pain Management Facility    Patient type: Established   Primary Reason(s) for Visit: Encounter for prescription drug management & post-procedure evaluation of chronic illness with mild to moderate exacerbation(Level of risk: moderate) CC: Hip Pain (bilateral, left worse); Neck Pain; and Arm Pain (bilateral)  HPI  Danielle [name] is a 55 y.o. year old, female patient, who comes today for an initial evaluation. She has Chronic pain; Long term current use of opiate analgesic; Long term prescription opiate use; Opiate use (60 MME/Day); B12 deficiency; Cervical spinal cord compression [facility]) (April, 2017); Cervical spinal stenosis; Chronic obstructive pulmonary disease ([place]); Clinical depression; BP (high blood pressure); Adult hypothyroidism; Adiposity; Mucositis oral; Groin pain; Current tobacco use; Chronic shoulder pain (Location of Primary Source of Pain) (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); History of total shoulder replacement (Bilateral); Chronic hip pain (Location of Secondary source of pain) (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); 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); Chronic low back pain (Location of Tertiary source of pain) (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); Lumbar facet syndrome (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); Lumbar spondylosis; Failed back surgical syndrome; Chronic lower extremity pain (Right); Chronic neck pain (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); Chronic cervical radicular pain (Right); Ulnar neuropathy of left upper extremity; Hx of cervical spine surgery; Cervical spondylosis; Encounter for therapeutic drug level monitoring; Encounter for pain management planning; Fibromyalgia; Chronic sacroiliac pain (B) (R>L); Seasonal allergic rhinitis; and Status post total replacement of right shoulder  on her problem list.. Her primarily concern today is the Hip Pain (bilateral, left worse); Neck Pain; and Arm Pain (bilateral)  Pain Assessment: Self-Reported Pain Score: 5 /10 Clinically the patient looks like a 2/10 Reported level is inconsistent with clinical observations. Information on the proper use of the pain score provided to the patient today. Pain Type: Chronic pain Pain Location: Hip (neck, arms) Pain Descriptors / Indicators: Throbbing Pain Frequency: Constant  The patient comes into the clinics today for post-procedure evaluation on the interventional treatment done on 03/13/2016. In addition, she comes in today for pharmacological management of her chronic pain.  The patient  reports that she does not use drugs.  Date of Last Visit: 03/12/16 Service Provided on Last Visit: Procedure (right hip injection)  Controlled Substance Pharmacotherapy Assessment & REMS (Risk Evaluation and Mitigation Strategy)  Analgesic: Oxycodone IR 10 mg every 6 hours (40 mg/day) MME/day: 60 mg/day.  Pill Count: Bottle labeled oxycodone 10 mg count #5/60.  Filled 03-19-16. Pharmacokinetics: Onset of action (Liberation/Absorption): Within expected pharmacological parameters Time to Peak effect (Distribution): Timing and results are as within normal expected parameters Duration of action (Metabolism/Excretion): Within normal limits for medication Pharmacodynamics: Analgesic Effect: More than 50% Activity Facilitation: Medication(s) allow patient to sit, stand, walk, and do the basic ADLs Perceived Effectiveness: Described as relatively effective, allowing for increase in activities of daily living (ADL) Side-effects or Adverse reactions: None reported Monitoring: [place] PMP: Online review of the past 54-month period conducted. Compliant with practice rules and regulations List of all UDS test(s) done:  Lab Results  Component Value Date   SUMMARY FINAL 01/02/2016   Last UDS on record: Summary    Date Value Ref Range Status  01/02/2016 FINAL  Final    Comment:    ==================================================================== TOXASSURE COMP DRUG ANALYSIS,UR ==================================================================== Test  Result       Flag       Units Drug Present and Declared for Prescription Verification   Oxycodone                      3985         EXPECTED   ng/mg creat   Oxymorphone                    451          EXPECTED   ng/mg creat   Noroxycodone                   >4464        EXPECTED   ng/mg creat   Noroxymorphone                 107          EXPECTED   ng/mg creat    Sources of oxycodone are scheduled prescription medications.    Oxymorphone, noroxycodone, and noroxymorphone are expected    metabolites of oxycodone. Oxymorphone is also available as a    scheduled prescription medication.   Citalopram                     PRESENT      EXPECTED   Desmethylcitalopram            PRESENT      EXPECTED    Desmethylcitalopram is an expected metabolite of citalopram or    the enantiomeric form, escitalopram.   Acetaminophen                  PRESENT      EXPECTED Drug Present not Declared for Prescription Verification   Tramadol                       PRESENT      UNEXPECTED   O-Desmethyltramadol            PRESENT      UNEXPECTED   N-Desmethyltramadol            PRESENT      UNEXPECTED    Source of tramadol is a prescription medication.    O-desmethyltramadol and N-desmethyltramadol are expected    metabolites of tramadol.   Diphenhydramine                PRESENT      UNEXPECTED Drug Absent but Declared for Prescription Verification   Alprazolam                     Not Detected UNEXPECTED ng/mg creat   Diclofenac                     Not Detected UNEXPECTED    Diclofenac, as indicated in the declared medication list, is not    always detected even when used as  directed. ==================================================================== Test                      Result    Flag   Units      Ref Range   Creatinine              224              mg/dL      >=20 ==================================================================== Declared Medications:  The flagging and interpretation on this report are  based on the  following declared medications.  Unexpected results may arise from  inaccuracies in the declared medications.  **Note: The testing scope of this panel includes these medications:  Alprazolam (Xanax)  Citalopram (Celexa)  Oxycodone  **Note: The testing scope of this panel does not include small to  moderate amounts of these reported medications:  Acetaminophen (Tylenol)  Diclofenac (Voltaren)  **Note: The testing scope of this panel does not include following  reported medications:  Albuterol (ProAir HFA)  Albuterol (Proventil)  Docusate (Colace)  Levothyroxine (Synthroid)  Loratadine (Claritin)  Magnesium (Milk of Magnesia)  Montelukast (Singulair)  Nystatin (Mycostatin)  Ondansetron (Zofran)  Tiotropium (Spiriva)  Vitamin B12  Vitamin D3 ==================================================================== For clinical consultation, please call [phone]. ====================================================================    UDS interpretation: Compliant          Medication Assessment Form: Reviewed. Patient indicates being compliant with therapy Treatment compliance: Compliant Risk Assessment: Aberrant Behavior: None observed today Substance Use Disorder (SUD) Risk Level: Moderate-to-high Risk of opioid abuse or dependence: 0.7-3.0% with doses ? 36 MME/day and 6.1-26% with doses ? 120 MME/day. Opioid Risk Tool (ORT) Score:  4 Moderate Risk for SUD (Score between 4-7) Depression Scale Score: PHQ-2: 0 No depression (0) PHQ-9: 0 No depression (0-4) Risk Mitigation Strategies:  Patient Counseling:   Completed Patient-Prescriber Agreement (PPA): Present and active  Notification to other healthcare providers: Done   Pharmacologic Plan: No change in therapy, at this time  Post-Procedure Assessment  Procedure done on 03/13/2016: Diagnostic right intra-articular hip joint injection under fluoroscopic guidance and IV sedation. Complications experienced at the time of the procedure: None Side-effects or Adverse reactions: None reported Sedation: Sedation provided. When no sedatives are used, the analgesic levels obtained are directly associated with the effectiveness of the local anesthetics. On the other hand, when sedation is provided, the level of analgesia obtained during the initial 1 hour immediately following the intervention, is believed to be the result of a combination of factors. These factors may include, but are not limited to: 1. The effectiveness of the local anesthetics used. 2. The effects of the analgesic(s) and/or anxiolytic(s) used. 3. The degree of discomfort experienced by the patient at the time of the procedure. 4. The patients ability and reliability in recalling and recording the events. 5. The presence and influence of possible secondary gains. Results: Relief during the 1st hour after the procedure: 100 % (Ultra-Short Term Relief) Interpretation note: Analgesia during this period is likely to be Local Anesthetic and/or IV Sedative (Analgesic/Anxiolitic) related Local Anesthesia: Long-acting (4-6 hours) anesthetics used. The analgesic levels attained during this period are directly associated to the localized infiltration of local anesthetics and therefore cary significant diagnostic value as to the etiological location or origin of the pain. Results: Relief during the next 4 to 6 hour after the procedure: 25 % (Short Term Relief) Interpretation note: Complete relief confirms area to be the source of pain Long-Term Therapy: Steroids used. Results: Extended relief  following procedure: 40 % (ongoing) Interpretation note: Long-term benefit would suggest an inflammatory etiology to the pain         Long-Term Benefits:  Current Relief (Now): 40%  Interpretation note: Persistent relief would suggest effective anti-inflammatory effects from steroids Interpretation of Results: These results would suggest this therapy to be effective in the management of this patient's condition.  Laboratory Chemistry  Inflammation Markers Lab Results  Component Value Date   ESRSEDRATE 37 (H) 01/08/2016   CRP 0.9 01/08/2016   Renal Function Lab Results  Component Value Date   BUN 8 01/08/2016   CREATININE 0.94 01/08/2016   GFRAA >60 01/08/2016   GFRNONAA >60 01/08/2016   Hepatic Function Lab Results  Component Value Date   AST 18 01/08/2016   ALT 14 01/08/2016   ALBUMIN 3.7 01/08/2016   Electrolytes Lab Results  Component Value Date   NA 137 01/08/2016   K 3.5 01/08/2016   CL 101 01/08/2016   CALCIUM 9.0 01/08/2016   MG 2.1 01/08/2016   Pain Modulating Vitamins Lab Results  Component Value Date   25OHVITD1 32 01/08/2016   25OHVITD2 2.0 01/08/2016   25OHVITD3 30 01/08/2016   VITAMINB12 >7,500 (H) 01/08/2016   Coagulation Parameters No results found for: INR, LABPROT, APTT, PLT Cardiovascular No results found for: BNP, HGB, HCT Note: Lab results reviewed.  Recent Diagnostic Imaging  Dg Si Joints  Result Date: 01/09/2016 CLINICAL DATA:  Bilateral hip pain. No known injury. Initial evaluation EXAM: BILATERAL SACROILIAC JOINTS - 3+ VIEW COMPARISON:  MRI 08/14/2011.Lumbar spine series 05/28/2010. FINDINGS: Degenerative changes lumbar spine, both SI joints, both hips. No acute abnormality identified. No evidence of erosive arthropathy. Pelvic calcifications consistent phleboliths. IMPRESSION: Degenerative changes lumbar spine, both SI joints, and both hips. No evidence of erosive arthropathy. No acute bony abnormality. Electronically Signed   By: Marcello Moores   Register   On: 01/09/2016 07:58   Dg Shoulder Right  Result Date: 01/09/2016 CLINICAL DATA:  Chronic shoulder pain. EXAM: RIGHT SHOULDER - 2+ VIEW COMPARISON:  Chest x-ray 02/08/2011. FINDINGS: Right shoulder replacement. Hardware intact. Good anatomic alignment. Degenerative changes noted about the right shoulder. Sclerotic focus is noted in the right glenoid. This could be related to degenerative change and/or prior surgery, however a small blastic lesion cannot be excluded. Diffuse osteopenia. No acute bony abnormality identified. Prior cervical spine fusion. IMPRESSION: 1. Right shoulder replacement good anatomic alignment. Degenerative changes right shoulder. Diffuse osteopenia. 2. Small sclerotic focus in the right glenoid. This may be degenerative and/or postsurgical. A small blastic lesion cannot be completely excluded . Electronically Signed   By: Marcello Moores  Register   On: 01/09/2016 08:14   Dg Shoulder Left  Result Date: 01/09/2016 CLINICAL DATA:  Chronic left shoulder pain with limited range of motion, previous left shoulder joint replacement EXAM: LEFT SHOULDER - 2+ VIEW COMPARISON:  MRI of the left shoulder of May 14, 2012 FINDINGS: There is a prosthetic humeral head. The remaining native bone of the humerus there is normal where visualized. The bony glenoid appears intact. [facility] joint is grossly normal. There is a subacromial spur. The observed portions of the left clavicle and upper left ribs are normal. IMPRESSION: No acute abnormality of the prosthetic left glenohumeral joint. There is no evidence of loosening or infection involving the humeral component. There is a subacromial spur. Electronically Signed   By: David  Martinique M.D.   On: 01/09/2016 07:57   Dg Hip Unilat W Or W/o Pelvis 2-3 Views Left  Addendum Date: 01/09/2016   ADDENDUM REPORT: 01/09/2016 08:42 ADDENDUM: Right hip series reveals degenerative changes lower lumbar spine, both SI joints, and both hips. Prior L5  laminectomy. No acute bony abnormality identified of the right or left hip. Electronically Signed   By: Marcello Moores  Register   On: 01/09/2016 08:42  Result Date: 01/09/2016 CLINICAL DATA:  Bilateral hip pain.  No known injury. EXAM: DG HIP (WITH OR WITHOUT PELVIS) 2-3V LEFT COMPARISON:  MRI lumbar spine 08/14/2011. Lumbar spine series of 14 2011. FINDINGS: Degenerative changes lumbar spine and  both hips. Prior L5 laminectomies. No acute bony abnormality identified. IMPRESSION: Degenerative changes lower lumbar spine, both SI joints, both hips. Prior L5 laminectomies. No acute bony abnormality identified . Electronically Signed: By: Marcello Moores  Register On: 01/09/2016 08:00   Dg Hip Unilat W Or W/o Pelvis 2-3 Views Right  Addendum Date: 02/09/2016   ADDENDUM REPORT: 02/09/2016 08:56 ADDENDUM: Right hip series reveals degenerative changes lower lumbar spine, both SI joints, and both hips. Prior L5 laminectomy. No acute bony abnormality identified of the right or left hip. Electronically Signed   By: Marcello Moores  Register   On: 02/09/2016 08:56  Result Date: 02/09/2016 CLINICAL DATA:  Bilateral hip pain. No known injury. EXAM: DG HIP (WITH OR WITHOUT PELVIS) 2-3V LEFT COMPARISON:  MRI lumbar spine 08/14/2011. Lumbar spine series of 14 2011. FINDINGS: Degenerative changes lumbar spine and both hips. Prior L5 laminectomies. No acute bony abnormality identified. IMPRESSION: Degenerative changes lower lumbar spine, both SI joints, both hips. Prior L5 laminectomies. No acute bony abnormality identified . Electronically Signed: By: Marcello Moores  Register On: 02/09/2016 08:52   Meds  The patient has a current medication list which includes the following prescription(s): albuterol, cetirizine, vitamin d3, citalopram, cyanocobalamin, diclofenac sodium, levothyroxine, magnesium hydroxide, montelukast, naloxone, nystatin, oxycodone hcl, and spiriva handihaler.  Current Outpatient Prescriptions on File Prior to Visit  Medication Sig   . albuterol (PROVENTIL HFA;VENTOLIN HFA) 108 (90 Base) MCG/ACT inhaler Inhale into the lungs.  . cetirizine (ZYRTEC) 10 MG tablet Take 10 mg by mouth.  . Cholecalciferol (VITAMIN D3) 2000 units capsule Take 2,000 Units by mouth daily.   . citalopram (CELEXA) 40 MG tablet Take 40 mg by mouth daily.  . cyanocobalamin (,VITAMIN B-12,) 1000 MCG/ML injection Inject into the muscle.  . diclofenac sodium (VOLTAREN) 1 % GEL Apply topically.  Marland Kitchen levothyroxine (SYNTHROID, LEVOTHROID) 50 MCG tablet TAKE 1 TABLET (50 MCG TOTAL) BY MOUTH DAILY AT 0600.  . magnesium hydroxide (MILK OF MAGNESIA) 400 MG/5ML suspension Take by mouth.  . montelukast (SINGULAIR) 10 MG tablet TAKE 1 TABLET (10 MG TOTAL) BY MOUTH NIGHTLY.  . naloxone (NARCAN) 2 MG/2ML injection Inject content of syringe into thigh muscle. Call 911.  . nystatin (MYCOSTATIN/NYSTOP) powder Apply topically.  Marland Kitchen SPIRIVA HANDIHALER 18 MCG inhalation capsule PLACE 1 CAPSULE (18 MCG TOTAL) INTO INHALER AND INHALE ONCE DAILY.   No current facility-administered medications on file prior to visit.     ROS  Constitutional: Denies any fever or chills Gastrointestinal: No reported hemesis, hematochezia, vomiting, or acute GI distress Musculoskeletal: Denies any acute onset joint swelling, redness, loss of ROM, or weakness Neurological: No reported episodes of acute onset apraxia, aphasia, dysarthria, agnosia, amnesia, paralysis, loss of coordination, or loss of consciousness  Allergies  Ms. Wolden is allergic to aripiprazole; duloxetine; gabapentin; gold; iodinated diagnostic agents; naproxen sodium; nortriptyline hcl; nsaids; pregabalin; ace inhibitors; aspirin; cefpodoxime; fluoxetine; fluticasone-salmeterol; levofloxacin; lithium; meperidine; paroxetine; tape; telithromycin; theophylline; topiramate; trazodone; triamcinolone; venlafaxine; bupropion; ketorolac tromethamine; morphine; and moxifloxacin.  [place]  Medical:  Ms. Woessner  has a past medical  history of Anxiety; Asthma; Bell's palsy; COPD (chronic obstructive pulmonary disease) ([place]); Depression; Fibromyalgia; Heart murmur; Hepatitis C; Hiatal hernia; IBS (irritable bowel syndrome); Insomnia; and Thyroid disease. Family: family history includes Cancer in her mother; Gout in her mother; Heart disease in her father. Surgical:  has a past surgical history that includes necksurgery; Back surgery; Foot surgery (Right); Elbow surgery (Left); Carpal tunnel release (Right); Nose surgery; Partial hysterectomy; and Total shoulder replacement (Bilateral). Tobacco:  reports that she has been smoking Cigarettes.  She has a 40.00 pack-year smoking history. She has never used smokeless tobacco. Alcohol:  reports that she drinks alcohol. Drug:  reports that she does not use drugs.  Constitutional Exam  General appearance: Well nourished, well developed, and well hydrated. In no acute distress Vitals:   04/04/16 0917  BP: 135/78  Pulse: 93  Resp: 18  Temp: 98.6 F (37 C)  SpO2: 99%  Weight: 220 lb (99.8 kg)  Height: 5\' 4"  (1.626 m)  BMI Assessment: Estimated body mass index is 37.76 kg/m as calculated from the following:   Height as of this encounter: 5\' 4"  (1.626 m).   Weight as of this encounter: 220 lb (99.8 kg).   BMI interpretation: (35-39.9 kg/m2) = Severe obesity (Class II): This range is associated with a 136% higher incidence of chronic pain. BMI Readings from Last 4 Encounters:  04/04/16 37.76 kg/m  03/12/16 39.94 kg/m  01/11/16 41.93 kg/m  01/02/16 41.60 kg/m   Wt Readings from Last 4 Encounters:  04/04/16 220 lb (99.8 kg)  03/12/16 240 lb (108.9 kg)  01/11/16 252 lb (114.3 kg)  01/02/16 250 lb (113.4 kg)  Psych/Mental status: Alert and oriented x 3 (person, place, & time) Eyes: PERLA Respiratory: No evidence of acute respiratory distress  Cervical Spine Exam  Inspection: No masses, redness, or swelling Alignment: Symmetrical Functional ROM: ROM appears  unrestricted Stability: No instability detected Muscle strength & Tone: Functionally intact Sensory: Unimpaired Palpation: Non-contributory  Upper Extremity (UE) Exam    Side: Right upper extremity  Side: Left upper extremity  Inspection: No masses, redness, swelling, or asymmetry  Inspection: No masses, redness, swelling, or asymmetry  Functional ROM: ROM appears unrestricted          Functional ROM: ROM appears unrestricted          Muscle strength & Tone: Functionally intact  Muscle strength & Tone: Functionally intact  Sensory: Unimpaired  Sensory: Unimpaired  Palpation: Non-contributory  Palpation: Non-contributory   Thoracic Spine Exam  Inspection: No masses, redness, or swelling Alignment: Symmetrical Functional ROM: ROM appears unrestricted Stability: No instability detected Sensory: Unimpaired Muscle strength & Tone: Functionally intact Palpation: Non-contributory  Lumbar Spine Exam  Inspection: No masses, redness, or swelling Alignment: Symmetrical Functional ROM: ROM appears unrestricted Stability: No instability detected Muscle strength & Tone: Functionally intact Sensory: Unimpaired Palpation: Non-contributory Provocative Tests: Lumbar Hyperextension and rotation test: evaluation deferred today       Patrick's Maneuver: evaluation deferred today              Gait & Posture Assessment  Ambulation: Unassisted Gait: Relatively normal for age and body habitus Posture: WNL   Lower Extremity Exam    Side: Right lower extremity  Side: Left lower extremity  Inspection: No masses, redness, swelling, or asymmetry  Inspection: No masses, redness, swelling, or asymmetry  Functional ROM: ROM appears unrestricted          Functional ROM: ROM appears unrestricted          Muscle strength & Tone: Functionally intact  Muscle strength & Tone: Functionally intact  Sensory: Unimpaired  Sensory: Unimpaired  Palpation: Non-contributory  Palpation: Non-contributory   Assessment   Primary Diagnosis & Pertinent Problem List: The primary encounter diagnosis was Chronic pain. Diagnoses of Long term current use of opiate analgesic, Opiate use (90 MME/Day), and Chronic shoulder pain (Location of Primary Source of Pain) (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) were also pertinent to this visit.  Visit Diagnosis: 1.  Chronic pain   2. Long term current use of opiate analgesic   3. Opiate use (90 MME/Day)   4. Chronic shoulder pain (Location of Primary Source of Pain) (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)    Plan of Care   Problem List Items Addressed This Visit      High   Chronic pain - Primary (Chronic)   Relevant Medications   Oxycodone HCl 10 MG TABS   Chronic shoulder pain (Location of Primary Source of Pain) (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) (Chronic)   Relevant Orders   SUPRASCAPULAR NERVE BLOCK     Medium   Long term current use of opiate analgesic (Chronic)   Relevant Orders   ToxASSURE Select 13 (MW), Urine   Opiate use (60 MME/Day) (Chronic)    Other Visit Diagnoses   None.    Pharmacotherapy (Medications Ordered): Meds ordered this encounter  Medications  . Oxycodone HCl 10 MG TABS    Sig: Take 1 tablet (10 mg total) by mouth every 6 (six) hours as needed.    Dispense:  120 tablet    Refill:  0    Do not add this medication to the electronic "Automatic Refill" notification system. Patient may have prescription filled one day early if pharmacy is closed on scheduled refill date. Do not fill until: 04/04/16 To last until: 05/04/16   New Prescriptions   No medications on file   Medications administered during this visit: Ms. Gunzenhauser had no medications administered during this visit. Lab-work, Procedure(s), & Referral(s) Ordered: Orders Placed This Encounter  Procedures  . SUPRASCAPULAR NERVE BLOCK  . ToxASSURE Select 13 (MW), Urine   Imaging & Referral(s) Ordered: None  Interventional Therapies: Scheduled:   Diagnostic bilateral suprascapular nerve block under fluoroscopic guidance and IV  sedation.    Considering:   1. Bilateral diagnostic suprascapular nerve block under fluoroscopic guidance and IV sedation.  2. Possible bilateral suprascapular nerve radiofrequency ablation, depending on the results of a diagnostic injection.  3. Diagnostic bilateral intra-articular hip joint injection under fluoroscopic guidance, with a without sedation.  4. Possible bilateral hip joint radiofrequency ablation under fluoroscopic guidance and IV sedation, depending on the results of the diagnostic injection.  5. Diagnostic bilateral lumbar facet block under fluoroscopic guidance and IV sedation.  6. Possible bilateral lumbar facet radiofrequency ablation under fluoroscopic guidance and IV sedation the pending on the results of the diagnostic injection.  7. Diagnostic caudal epidural steroid injection under fluoroscopic guidance + diagnostic epidurogram, with or without sedation.  8. Possible Racz procedure. 9. Diagnostic bilateral cervical facet block under fluoroscopic guidance and IV sedation.  10. Possible bilateral cervical facet radiofrequency ablation under fluoroscopic guidance and IV sedation.  11. Diagnostic right-sided cervical epidural steroid injection under fluoroscopic guidance, with or without sedation.  12. Diagnostic bilateral sacroiliac joint block under fluoroscopy, with or without sedation. 13. Possible bilateral sacroiliac joint RFA under fluoroscopy and IV sedation.   PRN Procedures:   1. Bilateral diagnostic suprascapular nerve block under fluoroscopic guidance and IV sedation.   2. Diagnostic bilateral intra-articular hip joint injection under fluoroscopic guidance, with a without sedation.   3. Diagnostic bilateral lumbar facet block under fluoroscopic guidance and IV sedation.   4. Diagnostic caudal epidural steroid injection under fluoroscopic guidance + diagnostic epidurogram, with or without sedation.  5. Diagnostic bilateral cervical facet block under  fluoroscopic guidance and IV sedation.  6. Diagnostic right-sided cervical epidural steroid injection under fluoroscopic guidance, with or without sedation.  7. Diagnostic bilateral sacroiliac joint block under fluoroscopy, with or  without sedation.   Requested PM Follow-up: Return in 4 weeks (on 04/29/2016) for Med-Mgmt, In addition, Schedule Procedure, ([facility]).  Future Appointments Date Time Provider [place]  04/09/2016 12:30 PM Milinda Pointer, MD [facility] None  04/24/2016 11:00 AM Milinda Pointer, MD [facility] None  05/29/2016 4:00 PM Robert Bellow, MD [facility] None    Primary Care Physician: [facility] PRIMARY CARE Location: [facility] Note by: Kathlen Brunswick. [name], M.D, DABA, DABAPM, DABPM, DABIPP, FIPP  Pain Score Disclaimer: We use the NRS-11 scale. This is a self-reported, subjective measurement of pain severity with only modest accuracy. It is used primarily to identify changes within a particular patient. It must be understood that outpatient pain scales are significantly less accurate that those used for research, where they can be applied under ideal controlled circumstances with minimal exposure to variables. In reality, the score is likely to be a combination of pain intensity and pain affect, where pain affect describes the degree of emotional arousal or changes in action readiness caused by the sensory experience of pain. Factors such as social and work situation, setting, emotional state, anxiety levels, expectation, and prior pain experience may influence pain perception and show large inter-individual differences that may also be affected by time variables.  Patient instructions provided during this appointment: Patient Instructions  Smoking Cessation, Tips for Success If you are ready to quit smoking, congratulations! You have chosen to help yourself be healthier. Cigarettes bring nicotine, tar, carbon monoxide, and other  irritants into your body. Your lungs, heart, and blood vessels will be able to work better without these poisons. There are many different ways to quit smoking. Nicotine gum, nicotine patches, a nicotine inhaler, or nicotine nasal spray can help with physical craving. Hypnosis, support groups, and medicines help break the habit of smoking. WHAT THINGS CAN I DO TO MAKE QUITTING EASIER?  Here are some tips to help you quit for good:  Pick a date when you will quit smoking completely. Tell all of your friends and family about your plan to quit on that date.  Do not try to slowly cut down on the number of cigarettes you are smoking. Pick a quit date and quit smoking completely starting on that day.  Throw away all cigarettes.   Clean and remove all ashtrays from your home, work, and car.  On a card, write down your reasons for quitting. Carry the card with you and read it when you get the urge to smoke.  Cleanse your body of nicotine. Drink enough water and fluids to keep your urine clear or pale yellow. Do this after quitting to flush the nicotine from your body.  Learn to predict your moods. Do not let a bad situation be your excuse to have a cigarette. Some situations in your life might tempt you into wanting a cigarette.  Never have "just one" cigarette. It leads to wanting another and another. Remind yourself of your decision to quit.  Change habits associated with smoking. If you smoked while driving or when feeling stressed, try other activities to replace smoking. Stand up when drinking your coffee. Brush your teeth after eating. Sit in a different chair when you read the paper. Avoid alcohol while trying to quit, and try to drink fewer caffeinated beverages. Alcohol and caffeine may urge you to smoke.  Avoid foods and drinks that can trigger a desire to smoke, such as sugary or spicy foods and alcohol.  Ask people who smoke not to smoke around  you.  Have something planned to do right  after eating or having a cup of coffee. For example, plan to take a walk or exercise.  Try a relaxation exercise to calm you down and decrease your stress. Remember, you may be tense and nervous for the first 2 weeks after you quit, but this will pass.  Find new activities to keep your hands busy. Play with a pen, coin, or rubber band. Doodle or draw things on paper.  Brush your teeth right after eating. This will help cut down on the craving for the taste of tobacco after meals. You can also try mouthwash.   Use oral substitutes in place of cigarettes. Try using lemon drops, carrots, cinnamon sticks, or chewing gum. Keep them handy so they are available when you have the urge to smoke.  When you have the urge to smoke, try deep breathing.  Designate your home as a nonsmoking area.  If you are a heavy smoker, ask your health care provider about a prescription for nicotine chewing gum. It can ease your withdrawal from nicotine.  Reward yourself. Set aside the cigarette money you save and buy yourself something nice.  Look for support from others. Join a support group or smoking cessation program. Ask someone at home or at work to help you with your plan to quit smoking.  Always ask yourself, "Do I need this cigarette or is this just a reflex?" Tell yourself, "Today, I choose not to smoke," or "I do not want to smoke." You are reminding yourself of your decision to quit.  Do not replace cigarette smoking with electronic cigarettes (commonly called e-cigarettes). The safety of e-cigarettes is unknown, and some may contain harmful chemicals.  If you relapse, do not give up! Plan ahead and think about what you will do the next time you get the urge to smoke. HOW WILL I FEEL WHEN I QUIT SMOKING? You may have symptoms of withdrawal because your body is used to nicotine (the addictive substance in cigarettes). You may crave cigarettes, be irritable, feel very hungry, cough often, get headaches, or  have difficulty concentrating. The withdrawal symptoms are only temporary. They are strongest when you first quit but will go away within 10-14 days. When withdrawal symptoms occur, stay in control. Think about your reasons for quitting. Remind yourself that these are signs that your body is healing and getting used to being without cigarettes. Remember that withdrawal symptoms are easier to treat than the major diseases that smoking can cause.  Even after the withdrawal is over, expect periodic urges to smoke. However, these cravings are generally short lived and will go away whether you smoke or not. Do not smoke! WHAT RESOURCES ARE AVAILABLE TO HELP ME QUIT SMOKING? Your health care provider can direct you to community resources or hospitals for support, which may include:  Group support.  Education.  Hypnosis.  Therapy.   This information is not intended to replace advice given to you by your health care provider. Make sure you discuss any questions you have with your health care provider.   Document Released: 03/29/2004 Document Revised: 07/22/2014 Document Reviewed: 12/17/2012 Elsevier Interactive Patient Education 2016 [employer] TPI Trigger Point Injection Trigger points are areas where you have muscle pain. A trigger point injection is a shot given in the trigger point to relieve that pain. A trigger point might feel like a knot in your muscle. It hurts to press on a trigger point. Sometimes the pain spreads out (radiates)  to other parts of the body. For example, pressing on a trigger point in your shoulder might cause pain in your arm or neck. You might have one trigger point. Or, you might have more than one. People often have trigger points in their upper back and lower back. They also occur often in the neck and shoulders. Pain from a trigger point lasts for a long time. It can make it hard to keep moving. You might not be able to do the exercise or physical therapy that could  help you deal with the pain. A trigger point injection may help. It does not work for everyone. But, it may relieve your pain for a few days or a few months. A trigger point injection does not cure long-lasting (chronic) pain. LET YOUR CAREGIVER KNOW ABOUT:  Any allergies (especially to latex, lidocaine, or steroids).  Blood-thinning medicines that you take. These drugs can lead to bleeding or bruising after an injection. They include:  Aspirin.  Ibuprofen.  Clopidogrel.  Warfarin.  Other medicines you take. This includes all vitamins, herbs, eyedrops, over-the-counter medicines, and creams.  Use of steroids.  Recent infections.  Past problems with numbing medicines.  Bleeding problems.  Surgeries you have had.  Other health problems. RISKS AND COMPLICATIONS A trigger point injection is a safe treatment. However, problems may develop, such as:  Minor side effects usually go away in 1 to 2 days. These may include:  Soreness.  Bruising.  Stiffness.  More serious problems are rare. But, they may include:  Bleeding under the skin (hematoma).  Skin infection.  Breaking off of the needle under your skin.  Lung puncture.  The trigger point injection may not work for you. BEFORE THE PROCEDURE You may need to stop taking any medicine that thins your blood. This is to prevent bleeding and bruising. Usually these medicines are stopped several days before the injection. No other preparation is needed. PROCEDURE  A trigger point injection can be given in your caregiver's office or in a clinic. Each injection takes 2 minutes or less.  Your caregiver will feel for trigger points. The caregiver may use a marker to circle the area for the injection.  The skin over the trigger point will be washed with a germ-killing (antiseptic) solution.  The caregiver pinches the spot for the injection.  Then, a very thin needle is used for the shot. You may feel pain or a twitching  feeling when the needle enters the trigger point.  A numbing solution may be injected into the trigger point. Sometimes a drug to keep down swelling, redness, and warmth (inflammation) is also injected.  Your caregiver moves the needle around the trigger zone until the tightness and twitching goes away.  After the injection, your caregiver may put gentle pressure over the injection site.  Then it is covered with a bandage. AFTER THE PROCEDURE  You can go right home after the injection.  The bandage can be taken off after a few hours.  You may feel sore and stiff for 1 to 2 days.  Go back to your regular activities slowly. Your caregiver may ask you to stretch your muscles. Do not do anything that takes extra energy for a few days.  Follow your caregiver's instructions to manage and treat other pain.   This information is not intended to replace advice given to you by your health care provider. Make sure you discuss any questions you have with your health care provider.   Document Released:  06/20/2011 Document Revised: 10/26/2012 Document Reviewed: 06/20/2011 Elsevier Interactive Patient Education [employer].

## 2016-04-09 ENCOUNTER — Ambulatory Visit (HOSPITAL_BASED_OUTPATIENT_CLINIC_OR_DEPARTMENT_OTHER): Payer: Medicaid Other | Admitting: Pain Medicine

## 2016-04-09 ENCOUNTER — Encounter: Payer: Self-pay | Admitting: Pain Medicine

## 2016-04-09 ENCOUNTER — Ambulatory Visit
Admission: RE | Admit: 2016-04-09 | Discharge: 2016-04-09 | Disposition: A | Discharge status: Home / Self Care | Payer: Medicaid Other | Source: Ambulatory Visit | Attending: Pain Medicine | Admitting: Pain Medicine

## 2016-04-09 VITALS — BP 102/70 | HR 73 | Temp 97.1°F | Resp 14 | Ht 65.0 in | Wt 222.0 lb

## 2016-04-09 DIAGNOSIS — Z881 Allergy status to other antibiotic agents status: Secondary | ICD-10-CM | POA: Diagnosis not present

## 2016-04-09 DIAGNOSIS — Z885 Allergy status to narcotic agent status: Secondary | ICD-10-CM | POA: Diagnosis not present

## 2016-04-09 DIAGNOSIS — Z96612 Presence of left artificial shoulder joint: Secondary | ICD-10-CM | POA: Insufficient documentation

## 2016-04-09 DIAGNOSIS — Z91041 Radiographic dye allergy status: Secondary | ICD-10-CM | POA: Insufficient documentation

## 2016-04-09 DIAGNOSIS — E669 Obesity, unspecified: Secondary | ICD-10-CM | POA: Insufficient documentation

## 2016-04-09 DIAGNOSIS — J449 Chronic obstructive pulmonary disease, unspecified: Secondary | ICD-10-CM | POA: Diagnosis not present

## 2016-04-09 DIAGNOSIS — M25511 Pain in right shoulder: Secondary | ICD-10-CM

## 2016-04-09 DIAGNOSIS — Z79891 Long term (current) use of opiate analgesic: Secondary | ICD-10-CM | POA: Diagnosis not present

## 2016-04-09 DIAGNOSIS — Z96611 Presence of right artificial shoulder joint: Secondary | ICD-10-CM | POA: Insufficient documentation

## 2016-04-09 DIAGNOSIS — E039 Hypothyroidism, unspecified: Secondary | ICD-10-CM | POA: Insufficient documentation

## 2016-04-09 DIAGNOSIS — G8929 Other chronic pain: Secondary | ICD-10-CM | POA: Insufficient documentation

## 2016-04-09 DIAGNOSIS — M25512 Pain in left shoulder: Secondary | ICD-10-CM

## 2016-04-09 DIAGNOSIS — Z9109 Other allergy status, other than to drugs and biological substances: Secondary | ICD-10-CM | POA: Diagnosis not present

## 2016-04-09 DIAGNOSIS — F329 Major depressive disorder, single episode, unspecified: Secondary | ICD-10-CM | POA: Diagnosis not present

## 2016-04-09 DIAGNOSIS — I10 Essential (primary) hypertension: Secondary | ICD-10-CM | POA: Diagnosis not present

## 2016-04-09 DIAGNOSIS — M797 Fibromyalgia: Secondary | ICD-10-CM | POA: Insufficient documentation

## 2016-04-09 DIAGNOSIS — Z6836 Body mass index (BMI) 36.0-36.9, adult: Secondary | ICD-10-CM | POA: Insufficient documentation

## 2016-04-09 DIAGNOSIS — Z886 Allergy status to analgesic agent status: Secondary | ICD-10-CM | POA: Insufficient documentation

## 2016-04-09 MED ORDER — LACTATED RINGERS IV SOLN
1000.0000 mL | Freq: Once | INTRAVENOUS | Status: AC
  Administered 2016-04-09: 1000 mL via INTRAVENOUS

## 2016-04-09 MED ORDER — MIDAZOLAM HCL 5 MG/5ML IJ SOLN
1.0000 mg | INTRAMUSCULAR | Status: DC | PRN
  Filled 2016-04-09: qty 5

## 2016-04-09 MED ORDER — FENTANYL CITRATE (PF) 100 MCG/2ML IJ SOLN
25.0000 ug | INTRAMUSCULAR | Status: DC | PRN
  Filled 2016-04-09: qty 2

## 2016-04-09 MED ORDER — ROPIVACAINE HCL 2 MG/ML IJ SOLN
9.0000 mL | Freq: Once | INTRAMUSCULAR | Status: AC
  Administered 2016-04-09: 9 mL
  Filled 2016-04-09: qty 20

## 2016-04-09 MED ORDER — TRIAMCINOLONE ACETONIDE 40 MG/ML IJ SUSP
40.0000 mg | Freq: Once | INTRAMUSCULAR | Status: AC
  Administered 2016-04-09: 40 mg
  Filled 2016-04-09: qty 2

## 2016-04-09 NOTE — Progress Notes (Signed)
Safety precautions to be maintained throughout the outpatient stay will include: orient to surroundings, keep bed in low position, maintain call bell within reach at all times, provide assistance with transfer out of bed and ambulation.  

## 2016-04-09 NOTE — Patient Instructions (Addendum)
Pain Management Discharge Instructions  General Discharge Instructions :  If you need to reach your doctor call: Monday-Friday 8:00 am - 4:00 pm at [phone] or toll free [phone].  After clinic hours [phone] to have operator reach doctor.  Bring all of your medication bottles to all your appointments in the pain clinic.  To cancel or reschedule your appointment with Pain Management please remember to call 24 hours in advance to avoid a fee.  Refer to the educational materials which you have been given on: General Risks, I had my Procedure. Discharge Instructions, Post Sedation.  Post Procedure Instructions:  The drugs you were given will stay in your system until tomorrow, so for the next 24 hours you should not drive, make any legal decisions or drink any alcoholic beverages.  You may eat anything you prefer, but it is better to start with liquids then soups and crackers, and gradually work up to solid foods.  Please notify your doctor immediately if you have any unusual bleeding, trouble breathing or pain that is not related to your normal pain.  Depending on the type of procedure that was done, some parts of your body may feel week and/or numb.  This usually clears up by tonight or the next day.  Walk with the use of an assistive device or accompanied by an adult for the 24 hours.  You may use ice on the affected area for the first 24 hours.  Put ice in a Ziploc bag and cover with a towel and place against area 15 minutes on 15 minutes off.  You may switch to heat after 24 hours.GENERAL RISKS AND COMPLICATIONS  What are the risk, side effects and possible complications? Generally speaking, most procedures are safe.  However, with any procedure there are risks, side effects, and the possibility of complications.  The risks and complications are dependent upon the sites that are lesioned, or the type of nerve block to be performed.  The closer the procedure is to the spine,  the more serious the risks are.  Great care is taken when placing the radio frequency needles, block needles or lesioning probes, but sometimes complications can occur. 1. Infection: Any time there is an injection through the skin, there is a risk of infection.  This is why sterile conditions are used for these blocks.  There are four possible types of infection. 1. Localized skin infection. 2. Central Nervous System Infection-This can be in the form of Meningitis, which can be deadly. 3. Epidural Infections-This can be in the form of an epidural abscess, which can cause pressure inside of the spine, causing compression of the spinal cord with subsequent paralysis. This would require an emergency surgery to decompress, and there are no guarantees that the patient would recover from the paralysis. 4. Discitis-This is an infection of the intervertebral discs.  It occurs in about 1% of discography procedures.  It is difficult to treat and it may lead to surgery.        2. Pain: the needles have to go through skin and soft tissues, will cause soreness.       3. Damage to internal structures:  The nerves to be lesioned may be near blood vessels or    other nerves which can be potentially damaged.       4. Bleeding: Bleeding is more common if the patient is taking blood thinners such as  aspirin, Coumadin, Ticiid, Plavix, etc., or if he/she have some genetic predisposition  such as   hemophilia. Bleeding into the spinal canal can cause compression of the spinal  cord with subsequent paralysis.  This would require an emergency surgery to  decompress and there are no guarantees that the patient would recover from the  paralysis.       5. Pneumothorax:  Puncturing of a lung is a possibility, every time a needle is introduced in  the area of the chest or upper back.  Pneumothorax refers to free air around the  collapsed lung(s), inside of the thoracic cavity (chest cavity).  Another two possible  complications  related to a similar event would include: Hemothorax and Chylothorax.   These are variations of the Pneumothorax, where instead of air around the collapsed  lung(s), you may have blood or chyle, respectively.       6. Spinal headaches: They may occur with any procedures in the area of the spine.       7. Persistent CSF (Cerebro-Spinal Fluid) leakage: This is a rare problem, but may occur  with prolonged intrathecal or epidural catheters either due to the formation of a fistulous  track or a dural tear.       8. Nerve damage: By working so close to the spinal cord, there is always a possibility of  nerve damage, which could be as serious as a permanent spinal cord injury with  paralysis.       9. Death:  Although rare, severe deadly allergic reactions known as "Anaphylactic  reaction" can occur to any of the medications used.      10. Worsening of the symptoms:  We can always make thing worse.  What are the chances of something like this happening? Chances of any of this occuring are extremely low.  By statistics, you have more of a chance of getting killed in a motor vehicle accident: while driving to the hospital than any of the above occurring .  Nevertheless, you should be aware that they are possibilities.  In general, it is similar to taking a shower.  Everybody knows that you can slip, hit your head and get killed.  Does that mean that you should not shower again?  Nevertheless always keep in mind that statistics do not mean anything if you happen to be on the wrong side of them.  Even if a procedure has a 1 (one) in a 1,000,000 (million) chance of going wrong, it you happen to be that one..Also, keep in mind that by statistics, you have more of a chance of having something go wrong when taking medications.  Who should not have this procedure? If you are on a blood thinning medication (e.g. Coumadin, Plavix, see list of "Blood Thinners"), or if you have an active infection going on, you should not  have the procedure.  If you are taking any blood thinners, please inform your physician.  How should I prepare for this procedure?  Do not eat or drink anything at least six hours prior to the procedure.  Bring a driver with you .  It cannot be a taxi.  Come accompanied by an adult that can drive you back, and that is strong enough to help you if your legs get weak or numb from the local anesthetic.  Take all of your medicines the morning of the procedure with just enough water to swallow them.  If you have diabetes, make sure that you are scheduled to have your procedure done first thing in the morning, whenever possible.  If you have diabetes,   take only half of your insulin dose and notify our nurse that you have done so as soon as you arrive at the clinic.  If you are diabetic, but only take blood sugar pills (oral hypoglycemic), then do not take them on the morning of your procedure.  You may take them after you have had the procedure.  Do not take aspirin or any aspirin-containing medications, at least eleven (11) days prior to the procedure.  They may prolong bleeding.  Wear loose fitting clothing that may be easy to take off and that you would not mind if it got stained with Betadine or blood.  Do not wear any jewelry or perfume  Remove any nail coloring.  It will interfere with some of our monitoring equipment.  NOTE: Remember that this is not meant to be interpreted as a complete list of all possible complications.  Unforeseen problems may occur.  BLOOD THINNERS The following drugs contain aspirin or other products, which can cause increased bleeding during surgery and should not be taken for 2 weeks prior to and 1 week after surgery.  If you should need take something for relief of minor pain, you may take acetaminophen which is found in Tylenol,m Datril, Anacin-3 and Panadol. It is not blood thinner. The products listed below are.  Do not take any of the products listed below  in addition to any listed on your instruction sheet.  A.P.C or A.P.C with Codeine Codeine Phosphate Capsules #3 Ibuprofen Ridaura  ABC compound Congesprin Imuran rimadil  Advil Cope Indocin Robaxisal  Alka-Seltzer Effervescent Pain Reliever and Antacid Coricidin or Coricidin-D  Indomethacin Rufen  Alka-Seltzer plus Cold Medicine Cosprin Ketoprofen S-A-C Tablets  Anacin Analgesic Tablets or Capsules Coumadin Korlgesic Salflex  Anacin Extra Strength Analgesic tablets or capsules CP-2 Tablets Lanoril Salicylate  Anaprox Cuprimine Capsules Levenox Salocol  Anexsia-D Dalteparin Magan Salsalate  Anodynos Darvon compound Magnesium Salicylate Sine-off  Ansaid Dasin Capsules Magsal Sodium Salicylate  Anturane Depen Capsules Marnal Soma  APF Arthritis pain formula Dewitt's Pills Measurin Stanback  Argesic Dia-Gesic Meclofenamic Sulfinpyrazone  Arthritis Bayer Timed Release Aspirin Diclofenac Meclomen Sulindac  Arthritis pain formula Anacin Dicumarol Medipren Supac  Analgesic (Safety coated) Arthralgen Diffunasal Mefanamic Suprofen  Arthritis Strength Bufferin Dihydrocodeine Mepro Compound Suprol  Arthropan liquid Dopirydamole Methcarbomol with Aspirin Synalgos  ASA tablets/Enseals Disalcid Micrainin Tagament  Ascriptin Doan's Midol Talwin  Ascriptin A/D Dolene Mobidin Tanderil  Ascriptin Extra Strength Dolobid Moblgesic Ticlid  Ascriptin with Codeine Doloprin or Doloprin with Codeine Momentum Tolectin  Asperbuf Duoprin Mono-gesic Trendar  Aspergum Duradyne Motrin or Motrin IB Triminicin  Aspirin plain, buffered or enteric coated Durasal Myochrisine Trigesic  Aspirin Suppositories Easprin Nalfon Trillsate  Aspirin with Codeine Ecotrin Regular or Extra Strength Naprosyn Uracel  Atromid-S Efficin Naproxen Ursinus  Auranofin Capsules Elmiron Neocylate Vanquish  Axotal Emagrin Norgesic Verin  Azathioprine Empirin or Empirin with Codeine Normiflo Vitamin E  Azolid Emprazil Nuprin Voltaren  Bayer  Aspirin plain, buffered or children's or timed BC Tablets or powders Encaprin Orgaran Warfarin Sodium  Buff-a-Comp Enoxaparin Orudis Zorpin  Buff-a-Comp with Codeine Equegesic Os-Cal-Gesic   Buffaprin Excedrin plain, buffered or Extra Strength Oxalid   Bufferin Arthritis Strength Feldene Oxphenbutazone   Bufferin plain or Extra Strength Feldene Capsules Oxycodone with Aspirin   Bufferin with Codeine Fenoprofen Fenoprofen Pabalate or Pabalate-SF   Buffets II Flogesic Panagesic   Buffinol plain or Extra Strength Florinal or Florinal with Codeine Panwarfarin   Buf-Tabs Flurbiprofen Penicillamine   Butalbital Compound Four-way cold tablets   Penicillin   Butazolidin Fragmin Pepto-Bismol   Carbenicillin Geminisyn Percodan   Carna Arthritis Reliever Geopen Persantine   Carprofen Gold's salt Persistin   Chloramphenicol Goody's Phenylbutazone   Chloromycetin Haltrain Piroxlcam   Clmetidine heparin Plaquenil   Cllnoril Hyco-pap Ponstel   Clofibrate Hydroxy chloroquine Propoxyphen         Before stopping any of these medications, be sure to consult the physician who ordered them.  Some, such as Coumadin (Warfarin) are ordered to prevent or treat serious conditions such as "deep thrombosis", "pumonary embolisms", and other heart problems.  The amount of time that you may need off of the medication may also vary with the medication and the reason for which you were taking it.  If you are taking any of these medications, please make sure you notify your pain physician before you undergo any procedures.          Trigger Point Injection Trigger points are areas where you have muscle pain. A trigger point injection is a shot given in the trigger point to relieve that pain. A trigger point might feel like a knot in your muscle. It hurts to press on a trigger point. Sometimes the pain spreads out (radiates) to other parts of the body. For example, pressing on a trigger point in your shoulder might cause  pain in your arm or neck. You might have one trigger point. Or, you might have more than one. People often have trigger points in their upper back and lower back. They also occur often in the neck and shoulders. Pain from a trigger point lasts for a long time. It can make it hard to keep moving. You might not be able to do the exercise or physical therapy that could help you deal with the pain. A trigger point injection may help. It does not work for everyone. But, it may relieve your pain for a few days or a few months. A trigger point injection does not cure long-lasting (chronic) pain. LET YOUR CAREGIVER KNOW ABOUT:  Any allergies (especially to latex, lidocaine, or steroids).  Blood-thinning medicines that you take. These drugs can lead to bleeding or bruising after an injection. They include:  Aspirin.  Ibuprofen.  Clopidogrel.  Warfarin.  Other medicines you take. This includes all vitamins, herbs, eyedrops, over-the-counter medicines, and creams.  Use of steroids.  Recent infections.  Past problems with numbing medicines.  Bleeding problems.  Surgeries you have had.  Other health problems. RISKS AND COMPLICATIONS A trigger point injection is a safe treatment. However, problems may develop, such as:  Minor side effects usually go away in 1 to 2 days. These may include:  Soreness.  Bruising.  Stiffness.  More serious problems are rare. But, they may include:  Bleeding under the skin (hematoma).  Skin infection.  Breaking off of the needle under your skin.  Lung puncture.  The trigger point injection may not work for you. BEFORE THE PROCEDURE You may need to stop taking any medicine that thins your blood. This is to prevent bleeding and bruising. Usually these medicines are stopped several days before the injection. No other preparation is needed. PROCEDURE  A trigger point injection can be given in your caregiver's office or in a clinic. Each injection takes  2 minutes or less.  Your caregiver will feel for trigger points. The caregiver may use a marker to circle the area for the injection.  The skin over the trigger point will be washed with a germ-killing (antiseptic) solution.  The caregiver pinches the spot for the injection.  Then, a very thin needle is used for the shot. You may feel pain or a twitching feeling when the needle enters the trigger point.  A numbing solution may be injected into the trigger point. Sometimes a drug to keep down swelling, redness, and warmth (inflammation) is also injected.  Your caregiver moves the needle around the trigger zone until the tightness and twitching goes away.  After the injection, your caregiver may put gentle pressure over the injection site.  Then it is covered with a bandage. AFTER THE PROCEDURE  You can go right home after the injection.  The bandage can be taken off after a few hours.  You may feel sore and stiff for 1 to 2 days.  Go back to your regular activities slowly. Your caregiver may ask you to stretch your muscles. Do not do anything that takes extra energy for a few days.  Follow your caregiver's instructions to manage and treat other pain.   This information is not intended to replace advice given to you by your health care provider. Make sure you discuss any questions you have with your health care provider.   Document Released: 06/20/2011 Document Revised: 10/26/2012 Document Reviewed: 06/20/2011 Elsevier Interactive Patient Education [employer].

## 2016-04-09 NOTE — Progress Notes (Signed)
Patient's Name: Danielle [name]  MRN: [id number]  Referring Provider: Care, [place] [facility]  DOB: [birth year]  PCP: [facility]  DOS: 04/09/2016  Note by: Kathlen Brunswick. [name], MD  Service setting: Ambulatory outpatient  Location: [facility] (AMB) Pain Management Facility  Visit type: Procedure  Specialty: Interventional Pain Management  Patient type: Established   Primary Reason for Visit: Interventional Pain Management Treatment. CC: Shoulder Pain (bilateral)  Procedure:  Anesthesia, Analgesia, Anxiolysis:  Type: Diagnostic Suprascapular nerve Block Region: Posterior Shoulder & Scapular Areas Level: Superior to the scapular spine, in the lateral aspect of the supraspinatus fossa (Suprescapular notch). Laterality: Bilateral  Type: Moderate (Conscious) Sedation & Local Anesthesia Local Anesthetic: Lidocaine 1% Route: Intravenous (IV) IV Access: Secured Sedation: Meaningful verbal contact was maintained at all times during the procedure  Indication(s): Analgesia & Anxiolysis   Indications: 1. Chronic shoulder pain (Location of Primary Source of Pain) (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)     Pain Score: Pre-procedure: 3 /10 Post-procedure: 0-No pain (0 for both shoulders)/10  Pre-Procedure Assessment  Danielle [name] is a 55 y.o. year old, female patient, seen today for interventional treatment. She has Chronic pain; Long term current use of opiate analgesic; Long term prescription opiate use; Opiate use (60 MME/Day); B12 deficiency; Cervical spinal cord compression [facility]) (April, 2017); Cervical spinal stenosis; Chronic obstructive pulmonary disease ([place]); Clinical depression; BP (high blood pressure); Adult hypothyroidism; Adiposity; Mucositis oral; Groin pain; Current tobacco use; Chronic shoulder pain (Location of Primary Source of Pain) (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); History of total shoulder replacement (Bilateral); Chronic hip pain (Location of Secondary source of pain) (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); Osteoarthritis of hip  (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); Chronic low back pain (Location of Tertiary source of pain) (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); Lumbar facet syndrome (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); Lumbar spondylosis; Failed back surgical syndrome; Chronic lower extremity pain (Right); Chronic neck pain (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); Chronic cervical radicular pain (Right); Ulnar neuropathy of left upper extremity; Hx of cervical spine surgery; Cervical spondylosis; Encounter for therapeutic drug level monitoring; Encounter for pain management planning; Fibromyalgia; Chronic sacroiliac pain (B) (R>L); Seasonal allergic rhinitis; and Status post total replacement of right shoulder on her problem list.. Her primarily concern today is the Shoulder Pain (bilateral)   Pain Descriptors / Indicators: Sharp, Aching Pain Frequency: Intermittent  Date of Last Visit: 04/04/16 Service Provided on Last Visit: Evaluation  Coagulation Parameters No results found for: INR, LABPROT, APTT, PLT  Verification of the correct person, correct site (including marking of site), and correct procedure were performed and confirmed by the patient.  Consent: Secured. Under the influence of no sedatives a written informed consent was obtained, after having provided information on the risks and possible complications. To fulfill our ethical and legal obligations, as recommended by the American Medical Association's Code of Ethics, we have provided information to the patient about our clinical impression; the nature and purpose of the treatment or procedure; the risks, benefits, and possible complications of the intervention; alternatives; the risk(s) and benefit(s) of the alternative treatment(s) or procedure(s); and the risk(s) and benefit(s) of doing nothing. The patient was provided information about the risks and possible complications associated with the procedure. These include, but are not limited to, failure to achieve desired goals, infection, bleeding, organ or nerve damage, allergic  reactions, paralysis, and death. The patient was also informed about the complications specifically associated with intra-articular injections, which include, but is not limited to: subcutaneous lipoatrophy, subcutaneous tissue atrophy, skin depigmentation (8.3%), intra-articular calcifications (4.9%), intra-articular tendon rupture, hemarthrosis (bleeding into joint), joint infection, suppression of hypothalamic-pituitary axis  lasting about two weeks, increase in blood glucose with impaired glucose control in diabetic patients, iatrogenic infections (0.0004%), acute flare-up of the pain (pseudoseptic reactions) (11%), crystal-proven pseudogout, iatrogenic septic arthritis (0.0005), injection site pain, and failure to provide relief. In addition, the patient was informed that Medicine is not an exact science; therefore, there is also the possibility of unforeseen risks and possible complications that may result in a catastrophic outcome. The patient indicated having understood very clearly. We have given the patient no guarantees and we have made no promises. Enough time was given to the patient to ask questions, all of which were answered to the patient's satisfaction.  Consent Attestation: I, the ordering provider, attest that I have discussed with the patient the benefits, risks, side-effects, alternatives, likelihood of achieving goals, and potential problems during recovery for the procedure that I have provided informed consent.  Pre-Procedure Preparation: Safety Precautions: Allergies reviewed. Appropriate site, procedure, and patient were confirmed by following the Joint Commission's Universal Protocol (UP.01.01.01), in the form of a "Time Out". The patient was asked to confirm marked site and procedure, before commencing. The patient was asked about blood thinners, or active infections, both of which were denied. Patient was assessed for positional comfort and all pressure points were checked before  starting procedure. Infection Control Precautions: Sterile technique used. Standard Universal Precautions were taken as recommended by the Department of [facility] for Disease Control and Prevention (CDC). Standard pre-surgical skin prep was conducted. Respiratory hygiene and cough etiquette was practiced. Hand hygiene observed. Safe injection practices and needle disposal techniques followed. SDV (single dose vial) medications used. Medications properly checked for expiration dates and contaminants. Personal protective equipment (PPE) used: Sterile Radiation-resistant gloves. Monitoring:  As per clinic protocol. Vitals:   04/09/16 1356 04/09/16 1406 04/09/16 1416 04/09/16 1426  BP: 118/69 108/64 106/61 102/70  Pulse: 73 78 81 73  Resp: '18 16 14   ' Temp:  97.1 F (36.2 C)    TempSrc:      SpO2: 94% 94% 96% 97%  Weight:      Height:      Calculated BMI: Body mass index is 36.94 kg/m. Allergies: She is allergic to aripiprazole; duloxetine; gabapentin; gold; iodinated diagnostic agents; naproxen sodium; nortriptyline hcl; nsaids; pregabalin; ace inhibitors; aspirin; cefpodoxime; fluoxetine; fluticasone-salmeterol; levofloxacin; lithium; meperidine; paroxetine; tape; telithromycin; theophylline; topiramate; trazodone; triamcinolone; venlafaxine; bupropion; ketorolac tromethamine; morphine; and moxifloxacin.. Allergy Precautions: None required  Description of Procedure Process:   Time-out: "Time-out" completed before starting procedure, as per protocol. Position: Prone", Target Area: Suprascapular notch. Approach: Posterior approach. Area Prepped: Entire shoulder Area Prepping solution: ChloraPrep (2% chlorhexidine gluconate and 70% isopropyl alcohol) Safety Precautions: Aspiration looking for blood return was conducted prior to all injections. At no point did we inject any substances, as a needle was being advanced. No attempts were made at seeking any paresthesias. Safe injection  practices and needle disposal techniques used. Medications properly checked for expiration dates. SDV (single dose vial) medications used. Description of the Procedure: Protocol guidelines were followed. The patient was placed in position over the procedure table. The target area was identified and the area prepped in the usual manner. Skin & deeper tissues infiltrated with local anesthetic. Appropriate amount of time allowed to pass for local anesthetics to take effect. The procedure needles were then advanced to the target area. Proper needle placement secured. Negative aspiration confirmed. Solution injected in intermittent fashion, asking for systemic symptoms every 0.5cc of injectate. The needles were then removed and the  area cleansed, making sure to leave some of the prepping solution back to take advantage of its long term bactericidal properties. EBL: None Materials & Medications Used:  Needle(s) Used: 22g - 3.5" Spinal Needle(s) Solution Injected: 0.2% PF-Ropivacaine (8m) + SDV-Triamcinolone 463mml (32m89mMedications Administered today: We administered lactated ringers, triamcinolone acetonide, and ropivacaine (PF) 2 mg/ml (0.2%).Please see chart orders for dosing details.  Imaging Guidance:  Type of Imaging Technique: Fluoroscopy Guidance (Non-spinal) Indication(s): Assistance in needle guidance and placement for procedures requiring needle placement in or near specific anatomical locations not easily accessible without such assistance. Exposure Time: Please see nurses notes. Contrast: None used. Fluoroscopic Guidance: I was personally present in the fluoroscopy suite, where the patient was placed in position for the procedure, over the fluoroscopy-compatible table. Fluoroscopy was manipulated, using "Tunnel Vision Technique", to obtain the best possible view of the target area, on the affected side. Parallax error was corrected before commencing the procedure. A "direction-depth-direction"  technique was used to introduce the needle under continuous pulsed fluoroscopic guidance. Once the target was reached, antero-posterior, oblique, and lateral fluoroscopic projection views were taken to confirm needle placement in all planes. Permanently recorded images stored by scanning into EMR. Interpretation: No contrast injected. Intraoperative imaging interpretation by performing Physician. Adequate needle placement confirmed. Permanent hardcopy images in multiple planes scanned into the patient's record.  Antibiotic Prophylaxis:  Indication(s): No indications identified. Type:  Antibiotics Given (last 72 hours)    None       Post-operative Assessment:  Complications: No immediate post-treatment complications were observed. Disposition: The patient was discharged home, once institutional criteria were met. Return to clinic in 2 weeks for follow-up evaluation and interpretation of results. The patient tolerated the entire procedure well. A repeat set of vitals were taken after the procedure and the patient was kept under observation following institutional policy, for this type of procedure. Post-procedural neurological assessment was performed, showing return to baseline, prior to discharge. The patient was provided with post-procedure discharge instructions, including a section on how to identify potential problems. Should any problems arise concerning this procedure, the patient was given instructions to immediately contact us,[place] any time, without hesitation. In any case, we plan to contact the patient by telephone for a follow-up status report regarding this interventional procedure. Comments:  No additional relevant information.  Plan of Care   Problem List Items Addressed This Visit      High   Chronic shoulder pain (Location of Primary Source of Pain) (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) - Primary (Chronic)   Relevant Medications   fentaNYL (SUBLIMAZE) injection 25-50 mcg   lactated ringers  infusion 1,000 mL (Completed)   midazolam (VERSED) 5 MG/5ML injection 1-2 mg   triamcinolone acetonide (KENALOG-40) injection 40 mg (Completed)   ropivacaine (PF) 2 mg/ml (0.2%) (NAROPIN) epidural 9 mL (Completed)   Other Relevant Orders   DG C-Arm 1-60 Min-No Report (Completed)   Informed Consent Details: Transcribe to consent form and obtain patient signature    Other Visit Diagnoses   None.     Requested PM Follow-up: Return in about 2 weeks (around 04/23/2016) for Post-Procedure evaluation.  Future Appointments Date Time Provider [place]Pal[place] South0/05/2016 11:00 AM FraMilinda PointerD [facility] None  05/29/2016 4:00 PM JefRobert BellowD [facility] None    [facility] Note by: FraKathlen BrunswickavDossie Arbour.D, DABA, DABAPM, DABPM, DABIPP, FIPP  Disclaimer:  Medicine is not an exact science. The only guarantee in  medicine is that nothing is guaranteed. It is important to note that the decision to proceed with this intervention was based on the information collected from the patient. The Data and conclusions were drawn from the patient's questionnaire, the interview, and the physical examination. Because the information was provided in large part by the patient, it cannot be guaranteed that it has not been purposely or unconsciously manipulated. Every effort has been made to obtain as much relevant data as possible for this evaluation. It is important to note that the conclusions that lead to this procedure are derived in large part from the available data. Always take into account that the treatment will also be dependent on availability of resources and existing treatment guidelines, considered by other Pain Management Practitioners as being common knowledge and practice, at the time of the intervention. For Medico-Legal purposes, it is also important to point out that variation in procedural techniques and  pharmacological choices are the acceptable norm. The indications, contraindications, technique, and results of the above procedure should only be interpreted and judged by a Board-Certified Interventional Pain Specialist with extensive familiarity and expertise in the same exact procedure and technique. Attempts at providing opinions without similar or greater experience and expertise than that of the treating physician will be considered as inappropriate and unethical, and shall result in a formal complaint to the state medical board and applicable specialty societies.

## 2016-04-10 ENCOUNTER — Encounter: Payer: Self-pay | Admitting: *Deleted

## 2016-04-10 ENCOUNTER — Telehealth: Payer: Self-pay

## 2016-04-10 NOTE — Telephone Encounter (Signed)
No answer- Left message to call if needed 

## 2016-04-13 LAB — TOXASSURE SELECT 13 (MW), URINE

## 2016-04-24 ENCOUNTER — Encounter: Payer: Self-pay | Admitting: Pain Medicine

## 2016-04-24 ENCOUNTER — Ambulatory Visit: Payer: Medicaid Other | Attending: Pain Medicine | Admitting: Pain Medicine

## 2016-04-24 VITALS — BP 148/94 | HR 97 | Temp 98.4°F | Resp 18 | Ht 65.0 in | Wt 230.0 lb

## 2016-04-24 DIAGNOSIS — Z91041 Radiographic dye allergy status: Secondary | ICD-10-CM | POA: Insufficient documentation

## 2016-04-24 DIAGNOSIS — Z885 Allergy status to narcotic agent status: Secondary | ICD-10-CM | POA: Diagnosis not present

## 2016-04-24 DIAGNOSIS — F17210 Nicotine dependence, cigarettes, uncomplicated: Secondary | ICD-10-CM | POA: Diagnosis not present

## 2016-04-24 DIAGNOSIS — Z888 Allergy status to other drugs, medicaments and biological substances status: Secondary | ICD-10-CM | POA: Insufficient documentation

## 2016-04-24 DIAGNOSIS — M797 Fibromyalgia: Secondary | ICD-10-CM | POA: Diagnosis not present

## 2016-04-24 DIAGNOSIS — F419 Anxiety disorder, unspecified: Secondary | ICD-10-CM | POA: Diagnosis not present

## 2016-04-24 DIAGNOSIS — M25559 Pain in unspecified hip: Secondary | ICD-10-CM

## 2016-04-24 DIAGNOSIS — F329 Major depressive disorder, single episode, unspecified: Secondary | ICD-10-CM | POA: Insufficient documentation

## 2016-04-24 DIAGNOSIS — I10 Essential (primary) hypertension: Secondary | ICD-10-CM | POA: Diagnosis not present

## 2016-04-24 DIAGNOSIS — G8929 Other chronic pain: Secondary | ICD-10-CM

## 2016-04-24 DIAGNOSIS — M25551 Pain in right hip: Secondary | ICD-10-CM | POA: Diagnosis present

## 2016-04-24 DIAGNOSIS — E669 Obesity, unspecified: Secondary | ICD-10-CM | POA: Insufficient documentation

## 2016-04-24 DIAGNOSIS — Z9109 Other allergy status, other than to drugs and biological substances: Secondary | ICD-10-CM | POA: Insufficient documentation

## 2016-04-24 DIAGNOSIS — Z79899 Other long term (current) drug therapy: Secondary | ICD-10-CM | POA: Diagnosis not present

## 2016-04-24 DIAGNOSIS — Z886 Allergy status to analgesic agent status: Secondary | ICD-10-CM | POA: Insufficient documentation

## 2016-04-24 DIAGNOSIS — G894 Chronic pain syndrome: Secondary | ICD-10-CM | POA: Diagnosis not present

## 2016-04-24 DIAGNOSIS — E538 Deficiency of other specified B group vitamins: Secondary | ICD-10-CM | POA: Insufficient documentation

## 2016-04-24 DIAGNOSIS — J449 Chronic obstructive pulmonary disease, unspecified: Secondary | ICD-10-CM | POA: Diagnosis not present

## 2016-04-24 DIAGNOSIS — Z881 Allergy status to other antibiotic agents status: Secondary | ICD-10-CM | POA: Diagnosis not present

## 2016-04-24 DIAGNOSIS — M25511 Pain in right shoulder: Secondary | ICD-10-CM | POA: Diagnosis not present

## 2016-04-24 DIAGNOSIS — E039 Hypothyroidism, unspecified: Secondary | ICD-10-CM | POA: Insufficient documentation

## 2016-04-24 DIAGNOSIS — Z96611 Presence of right artificial shoulder joint: Secondary | ICD-10-CM | POA: Insufficient documentation

## 2016-04-24 DIAGNOSIS — Z6838 Body mass index (BMI) 38.0-38.9, adult: Secondary | ICD-10-CM | POA: Diagnosis not present

## 2016-04-24 DIAGNOSIS — M160 Bilateral primary osteoarthritis of hip: Secondary | ICD-10-CM | POA: Diagnosis not present

## 2016-04-24 DIAGNOSIS — M25512 Pain in left shoulder: Secondary | ICD-10-CM

## 2016-04-24 MED ORDER — OXYCODONE HCL 10 MG PO TABS: 10.0000 mg | ORAL_TABLET | Freq: Four times a day (QID) | ORAL | 0 refills | Status: DC | PRN

## 2016-04-24 NOTE — Progress Notes (Signed)
Safety precautions to be maintained throughout the outpatient stay will include: orient to surroundings, keep bed in low position, maintain call bell within reach at all times, provide assistance with transfer out of bed and ambulation. Bottle labeled oxycodone 10 mg #38/120 filled 04-04-16

## 2016-04-24 NOTE — Patient Instructions (Addendum)
Smoking Cessation, Tips for Success If you are ready to quit smoking, congratulations! You have chosen to help yourself be healthier. Cigarettes bring nicotine, tar, carbon monoxide, and other irritants into your body. Your lungs, heart, and blood vessels will be able to work better without these poisons. There are many different ways to quit smoking. Nicotine gum, nicotine patches, a nicotine inhaler, or nicotine nasal spray can help with physical craving. Hypnosis, support groups, and medicines help break the habit of smoking. WHAT THINGS CAN I DO TO MAKE QUITTING EASIER?  Here are some tips to help you quit for good:  Pick a date when you will quit smoking completely. Tell all of your friends and family about your plan to quit on that date.  Do not try to slowly cut down on the number of cigarettes you are smoking. Pick a quit date and quit smoking completely starting on that day.  Throw away all cigarettes.   Clean and remove all ashtrays from your home, work, and car.  On a card, write down your reasons for quitting. Carry the card with you and read it when you get the urge to smoke.  Cleanse your body of nicotine. Drink enough water and fluids to keep your urine clear or pale yellow. Do this after quitting to flush the nicotine from your body.  Learn to predict your moods. Do not let a bad situation be your excuse to have a cigarette. Some situations in your life might tempt you into wanting a cigarette.  Never have "just one" cigarette. It leads to wanting another and another. Remind yourself of your decision to quit.  Change habits associated with smoking. If you smoked while driving or when feeling stressed, try other activities to replace smoking. Stand up when drinking your coffee. Brush your teeth after eating. Sit in a different chair when you read the paper. Avoid alcohol while trying to quit, and try to drink fewer caffeinated beverages. Alcohol and caffeine may urge you to  smoke.  Avoid foods and drinks that can trigger a desire to smoke, such as sugary or spicy foods and alcohol.  Ask people who smoke not to smoke around you.  Have something planned to do right after eating or having a cup of coffee. For example, plan to take a walk or exercise.  Try a relaxation exercise to calm you down and decrease your stress. Remember, you may be tense and nervous for the first 2 weeks after you quit, but this will pass.  Find new activities to keep your hands busy. Play with a pen, coin, or rubber band. Doodle or draw things on paper.  Brush your teeth right after eating. This will help cut down on the craving for the taste of tobacco after meals. You can also try mouthwash.   Use oral substitutes in place of cigarettes. Try using lemon drops, carrots, cinnamon sticks, or chewing gum. Keep them handy so they are available when you have the urge to smoke.  When you have the urge to smoke, try deep breathing.  Designate your home as a nonsmoking area.  If you are a heavy smoker, ask your health care provider about a prescription for nicotine chewing gum. It can ease your withdrawal from nicotine.  Reward yourself. Set aside the cigarette money you save and buy yourself something nice.  Look for support from others. Join a support group or smoking cessation program. Ask someone at home or at work to help you with your plan   to quit smoking.  Always ask yourself, "Do I need this cigarette or is this just a reflex?" Tell yourself, "Today, I choose not to smoke," or "I do not want to smoke." You are reminding yourself of your decision to quit.  Do not replace cigarette smoking with electronic cigarettes (commonly called e-cigarettes). The safety of e-cigarettes is unknown, and some may contain harmful chemicals.  If you relapse, do not give up! Plan ahead and think about what you will do the next time you get the urge to smoke. HOW WILL I FEEL WHEN I QUIT SMOKING? You  may have symptoms of withdrawal because your body is used to nicotine (the addictive substance in cigarettes). You may crave cigarettes, be irritable, feel very hungry, cough often, get headaches, or have difficulty concentrating. The withdrawal symptoms are only temporary. They are strongest when you first quit but will go away within 10-14 days. When withdrawal symptoms occur, stay in control. Think about your reasons for quitting. Remind yourself that these are signs that your body is healing and getting used to being without cigarettes. Remember that withdrawal symptoms are easier to treat than the major diseases that smoking can cause.  Even after the withdrawal is over, expect periodic urges to smoke. However, these cravings are generally short lived and will go away whether you smoke or not. Do not smoke! WHAT RESOURCES ARE AVAILABLE TO HELP ME QUIT SMOKING? Your health care provider can direct you to community resources or hospitals for support, which may include:  Group support.  Education.  Hypnosis.  Therapy.   This information is not intended to replace advice given to you by your health care provider. Make sure you discuss any questions you have with your health care provider.   Document Released: 03/29/2004 Document Revised: 07/22/2014 Document Reviewed: 12/17/2012 Elsevier Interactive Patient Education 2016 [employer]. Preparing for Procedure with Sedation Instructions: . Oral Intake: Do not eat or drink anything for at least 8 hours prior to your procedure. . Transportation: Public transportation is not allowed. Bring an adult driver. The driver must be physically present in our waiting room before any procedure can be started. [place] Kitchen Physical Assistance: Bring an adult capable of physically assisting you, in the event you need help. . Blood Pressure Medicine: Take your blood pressure medicine with a sip of water the morning of the procedure. . Insulin: Take only  of your normal  insulin dose. . Preventing infections: Shower with an antibacterial soap the morning of your procedure. . Build-up your immune system: Take 1000 mg of Vitamin C with every meal (3 times a day) the day prior to your procedure. . Pregnancy: If you are pregnant, call and cancel the procedure. . Sickness: If you have a cold, fever, or any active infections, call and cancel the procedure. . Arrival: You must be in the facility at least 30 minutes prior to your scheduled procedure. . Children: Do not bring children with you. . Dress appropriately: Bring dark clothing that you would not mind if they get stained. . Valuables: Do not bring any jewelry or valuables. Procedure appointments are reserved for interventional treatments only. Marland Kitchen No Prescription Refills. . No medication changes will be discussed during procedure appointments. . No disability issues will be discussed.

## 2016-04-24 NOTE — Progress Notes (Signed)
Patient's Name: Danielle [name]  MRN: [id number]  Referring Provider: Care, [place] [facility]  DOB: 1961-03-17  PCP: [facility]  DOS: 04/24/2016  Note by: Kathlen Brunswick. Danielle Arbour, MD  Service setting: Ambulatory outpatient  Specialty: Interventional Pain Management  Location: [facility] (AMB) Pain Management Facility    Patient type: Established   Primary Reason(s) for Visit: Encounter for prescription drug management & post-procedure evaluation of chronic illness with mild to moderate exacerbation(Level of risk: moderate) CC: Hip Pain (right); Shoulder Pain (left); and Back Pain (low)  HPI  [name] is a 55 y.o. year old, female patient, who comes today for an initial evaluation. She has Chronic pain; Long term current use of opiate analgesic; Long term prescription opiate use; Opiate use (60 MME/Day); B12 deficiency; Cervical spinal cord compression [facility]) (April, 2017); Cervical spinal stenosis; Chronic obstructive pulmonary disease ([place]); Clinical depression; BP (high blood pressure); Adult hypothyroidism; Adiposity; Mucositis oral; Groin pain; Current tobacco use; Chronic shoulder pain (Location of Primary Source of Pain) (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); History of total shoulder replacement (Bilateral); Chronic hip pain (Location of Secondary source of pain) (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); 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); Chronic low back pain (Location of Tertiary source of pain) (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); Lumbar facet syndrome (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); Lumbar spondylosis; Failed back surgical syndrome; Chronic lower extremity pain (Right); Chronic neck pain (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); Chronic cervical radicular pain (Right); Ulnar neuropathy of left upper extremity; Hx of cervical spine surgery; Cervical spondylosis; Encounter for therapeutic drug level monitoring; Encounter for pain management planning; Fibromyalgia; Chronic sacroiliac pain (B) (R>L); Seasonal allergic rhinitis; Status post total replacement of right shoulder; Depression;  Hypertension; and Hypothyroid on her problem list.. Her primarily concern today is the Hip Pain (right); Shoulder Pain (left); and Back Pain (low)  Pain Assessment: Self-Reported Pain Score: 3 /10             Reported level is compatible with observation.       Pain Type: Chronic pain Pain Location: Hip (shoulder, back) Pain Orientation: Lower, Right Pain Descriptors / Indicators: Sharp, Aching Pain Frequency: Intermittent  The patient comes into the clinics today for post-procedure evaluation on the interventional treatment done on 04/09/2016. In addition, she comes in today for pharmacological management of her chronic pain.  The patient  reports that she does not use drugs.  Date of Last Visit: 04/09/16 Service Provided on Last Visit: Procedure (SSNB)  Controlled Substance Pharmacotherapy Assessment & REMS (Risk Evaluation and Mitigation Strategy)  Analgesic: Oxycodone IR 10 mg every 4 hours (60 mg/day of oxycodone). Alprazolam 1 mg 3 times a day. MME/day: 90 mg/day Pill Count: Bottle labeled oxycodone 10 mg #38/120 filled 04-04-16. Pharmacokinetics: Onset of action (Liberation/Absorption): Within expected pharmacological parameters Time to Peak effect (Distribution): Timing and results are as within normal expected parameters Duration of action (Metabolism/Excretion): Within normal limits for medication Pharmacodynamics: Analgesic Effect: More than 50% Activity Facilitation: Medication(s) allow patient to sit, stand, walk, and do the basic ADLs Perceived Effectiveness: Described as relatively effective, allowing for increase in activities of daily living (ADL) Side-effects or Adverse reactions: None reported Monitoring: [place] PMP: Online review of the past 26-month period conducted. Compliant with practice rules and regulations List of all UDS test(s) done:  Lab Results  Component Value Date   TOXASSSELUR FINAL 04/04/2016   SUMMARY FINAL 01/02/2016   Last UDS on  record: ToxAssure Select 13  Date Value Ref Range Status  04/04/2016 FINAL  Final    Comment:    ==================================================================== TOXASSURE SELECT 43 (MW) ====================================================================  Test                             Result       Flag       Units Drug Present and Declared for Prescription Verification   Oxycodone                      4030         EXPECTED   ng/mg creat   Oxymorphone                    202          EXPECTED   ng/mg creat   Noroxycodone                   6337         EXPECTED   ng/mg creat   Noroxymorphone                 77           EXPECTED   ng/mg creat    Sources of oxycodone are scheduled prescription medications.    Oxymorphone, noroxycodone, and noroxymorphone are expected    metabolites of oxycodone. Oxymorphone is also available as a    scheduled prescription medication. ==================================================================== Test                      Result    Flag   Units      Ref Range   Creatinine              86               mg/dL      >=20 ==================================================================== Declared Medications:  The flagging and interpretation on this report are based on the  following declared medications.  Unexpected results may arise from  inaccuracies in the declared medications.  **Note: The testing scope of this panel includes these medications:  Oxycodone  **Note: The testing scope of this panel does not include following  reported medications:  Albuterol  Cetirizine (Zyrtec)  Citalopram (Celexa)  Diclofenac (Voltaren)  Levothyroxine (Synthroid)  Magnesium  Montelukast (Singulair)  Naloxone (Narcan)  Nystatin  Tiotropium (Spiriva)  Vitamin B12  Vitamin D3 ==================================================================== For clinical consultation, please call [phone]. ====================================================================    Summary  Date Value Ref Range Status  01/02/2016 FINAL  Final    Comment:    ==================================================================== TOXASSURE COMP DRUG ANALYSIS,UR ==================================================================== Test                             Result       Flag       Units Drug Present and Declared for Prescription Verification   Oxycodone                      3985         EXPECTED   ng/mg creat   Oxymorphone                    451          EXPECTED   ng/mg creat   Noroxycodone                   >4464        EXPECTED   ng/mg creat  Noroxymorphone                 107          EXPECTED   ng/mg creat    Sources of oxycodone are scheduled prescription medications.    Oxymorphone, noroxycodone, and noroxymorphone are expected    metabolites of oxycodone. Oxymorphone is also available as a    scheduled prescription medication.   Citalopram                     PRESENT      EXPECTED   Desmethylcitalopram            PRESENT      EXPECTED    Desmethylcitalopram is an expected metabolite of citalopram or    the enantiomeric form, escitalopram.   Acetaminophen                  PRESENT      EXPECTED Drug Present not Declared for Prescription Verification   Tramadol                       PRESENT      UNEXPECTED   O-Desmethyltramadol            PRESENT      UNEXPECTED   N-Desmethyltramadol            PRESENT      UNEXPECTED    Source of tramadol is a prescription medication.    O-desmethyltramadol and N-desmethyltramadol are expected    metabolites of tramadol.   Diphenhydramine                PRESENT      UNEXPECTED Drug Absent but Declared for Prescription Verification   Alprazolam                     Not Detected UNEXPECTED ng/mg creat   Diclofenac                     Not Detected UNEXPECTED    Diclofenac, as indicated in the declared medication list, is not    always  detected even when used as directed. ==================================================================== Test                      Result    Flag   Units      Ref Range   Creatinine              224              mg/dL      >=20 ==================================================================== Declared Medications:  The flagging and interpretation on this report are based on the  following declared medications.  Unexpected results may arise from  inaccuracies in the declared medications.  **Note: The testing scope of this panel includes these medications:  Alprazolam (Xanax)  Citalopram (Celexa)  Oxycodone  **Note: The testing scope of this panel does not include small to  moderate amounts of these reported medications:  Acetaminophen (Tylenol)  Diclofenac (Voltaren)  **Note: The testing scope of this panel does not include following  reported medications:  Albuterol (ProAir HFA)  Albuterol (Proventil)  Docusate (Colace)  Levothyroxine (Synthroid)  Loratadine (Claritin)  Magnesium (Milk of Magnesia)  Montelukast (Singulair)  Nystatin (Mycostatin)  Ondansetron (Zofran)  Tiotropium (Spiriva)  Vitamin B12  Vitamin D3 ==================================================================== For clinical consultation, please call [phone]. ====================================================================    UDS interpretation: Compliant  Medication Assessment Form: Reviewed. Patient indicates being compliant with therapy Treatment compliance: Compliant Risk Assessment: Aberrant Behavior: None observed today Substance Use Disorder (SUD) Risk Level: Low-to-moderate Risk of opioid abuse or dependence: 0.7-3.0% with doses ? 36 MME/day and 6.1-26% with doses ? 120 MME/day. Opioid Risk Tool (ORT) Score:  4 Moderate Risk for SUD (Score between 4-7) Depression Scale Score: PHQ-2: 0 No depression (0) PHQ-9: 0 No depression (0-4) Risk Mitigation Strategies:  Patient  Counseling:  Completed Patient-Prescriber Agreement (PPA): Present and active  Notification to other healthcare providers: Done   Pharmacologic Plan: No change in therapy, at this time  Post-Procedure Assessment  Procedure done on 04/09/2016: Diagnostic bilateral suprascapular nerve block under fluoroscopic guidance and IV sedation Complications experienced at the time of the procedure: None Side-effects or Adverse reactions: None reported Sedation: Sedation provided. When no sedatives are used, the analgesic levels obtained are directly associated with the effectiveness of the local anesthetics. On the other hand, when sedation is provided, the level of analgesia obtained during the initial 1 hour, immediately following the intervention, is believed to be the result of a combination of factors. These factors may include, but are not limited to: 1. The effectiveness of the local anesthetics used. 2. The effects of the analgesic(s) and/or anxiolytic(s) used. 3. The degree of discomfort experienced by the patient at the time of the procedure. 4. The patients ability and reliability in recalling and recording the events. 5. The presence and influence of possible secondary gains. Results: Relief during the 1st hour after the procedure: 100 % (Ultra-Short Term Relief) Interpretative note: Analgesia during this period is likely to be Local Anesthetic and/or IV Sedative (Analgesic/Anxiolitic) related Local Anesthesia: Long-acting (4-6 hours) anesthetics used. The analgesic levels attained during this period are directly associated to the localized infiltration of local anesthetics and therefore cary significant diagnostic value as to the etiological location or origin of the pain. Results: Relief during the next 4 to 6 hour after the procedure: 100 % (Short Term Relief) Interpretative note: Complete relief confirms area to be the source of pain Long-Term Therapy: Steroids used. Results: Extended relief  following procedure: 80 % (lasting 2 weeks) Interpretative note: Long-term benefit would suggest an inflammatory etiology to the pain.         Long-Term Benefits:  Current Relief (Now): 50% (still having benefit on the  Interpretative note: Ongoing improvement of symptoms would suggest either persistent anti-inflammatory benefits or prolonged sympathectomy, in the case of sympathetically-mediated pain Interpretation of Results: These results would suggest the patient to be a good candidate for Radiofrequency Ablation.         Laboratory Chemistry  Inflammation Markers Lab Results  Component Value Date   ESRSEDRATE 37 (H) 01/08/2016   CRP 0.9 01/08/2016   Renal Function Lab Results  Component Value Date   BUN 8 01/08/2016   CREATININE 0.94 01/08/2016   GFRAA >60 01/08/2016   GFRNONAA >60 01/08/2016   Hepatic Function Lab Results  Component Value Date   AST 18 01/08/2016   ALT 14 01/08/2016   ALBUMIN 3.7 01/08/2016   Electrolytes Lab Results  Component Value Date   NA 137 01/08/2016   K 3.5 01/08/2016   CL 101 01/08/2016   CALCIUM 9.0 01/08/2016   MG 2.1 01/08/2016   Pain Modulating Vitamins Lab Results  Component Value Date   25OHVITD1 32 01/08/2016   25OHVITD2 2.0 01/08/2016   25OHVITD3 30 01/08/2016   VITAMINB12 >7,500 (H) 01/08/2016   Coagulation Parameters No results  found for: INR, LABPROT, APTT, PLT Cardiovascular No results found for: BNP, HGB, HCT Note: Lab results reviewed.  Recent Diagnostic Imaging  Dg C-arm 1-60 Min-no Report  Result Date: 04/09/2016 CLINICAL DATA: Sub-acute chronic pain syndrome C-ARM 1-60 MINUTES Fluoroscopy was utilized by the requesting physician.  No radiographic interpretation.   Meds  The patient has a current medication list which includes the following prescription(s): albuterol, amoxicillin-clavulanate, cetirizine, vitamin d3, citalopram, cyanocobalamin, diclofenac sodium, levothyroxine, magnesium hydroxide, montelukast,  naloxone, nystatin, oxycodone hcl, oxycodone hcl, oxycodone hcl, and spiriva handihaler.  Current Outpatient Prescriptions on File Prior to Visit  Medication Sig  . albuterol (PROVENTIL HFA;VENTOLIN HFA) 108 (90 Base) MCG/ACT inhaler Inhale into the lungs.  . cetirizine (ZYRTEC) 10 MG tablet Take 10 mg by mouth.  . Cholecalciferol (VITAMIN D3) 2000 units capsule Take 2,000 Units by mouth daily.   . citalopram (CELEXA) 40 MG tablet Take 40 mg by mouth daily.  . cyanocobalamin (,VITAMIN B-12,) 1000 MCG/ML injection Inject into the muscle.  . diclofenac sodium (VOLTAREN) 1 % GEL Apply topically.  Marland Kitchen levothyroxine (SYNTHROID, LEVOTHROID) 50 MCG tablet TAKE 1 TABLET (50 MCG TOTAL) BY MOUTH DAILY AT 0600.  . magnesium hydroxide (MILK OF MAGNESIA) 400 MG/5ML suspension Take by mouth.  . montelukast (SINGULAIR) 10 MG tablet TAKE 1 TABLET (10 MG TOTAL) BY MOUTH NIGHTLY.  . naloxone (NARCAN) 2 MG/2ML injection Inject content of syringe into thigh muscle. Call 911.  . nystatin (MYCOSTATIN/NYSTOP) powder Apply topically.  Marland Kitchen SPIRIVA HANDIHALER 18 MCG inhalation capsule PLACE 1 CAPSULE (18 MCG TOTAL) INTO INHALER AND INHALE ONCE DAILY.   No current facility-administered medications on file prior to visit.    ROS  Constitutional: Denies any fever or chills Gastrointestinal: No reported hemesis, hematochezia, vomiting, or acute GI distress Musculoskeletal: Denies any acute onset joint swelling, redness, loss of ROM, or weakness Neurological: No reported episodes of acute onset apraxia, aphasia, dysarthria, agnosia, amnesia, paralysis, loss of coordination, or loss of consciousness  Allergies  Ms. Crochet is allergic to aripiprazole; duloxetine; gabapentin; gold; iodinated diagnostic agents; naproxen sodium; nortriptyline hcl; nsaids; pregabalin; ace inhibitors; aspirin; cefpodoxime; fluoxetine; fluticasone-salmeterol; levofloxacin; lithium; meperidine; paroxetine; tape; telithromycin; theophylline;  topiramate; trazodone; triamcinolone; venlafaxine; bupropion; ketorolac tromethamine; morphine; and moxifloxacin.  [place]  Medical:  Ms. Cockrill  has a past medical history of Anxiety; Asthma; Bell's palsy; COPD (chronic obstructive pulmonary disease) ([place]); Depression; Fibromyalgia; Heart murmur; Hepatitis C; Hiatal hernia; IBS (irritable bowel syndrome); Insomnia; and Thyroid disease. Family: family history includes Cancer in her mother; Gout in her mother; Heart disease in her father. Surgical:  has a past surgical history that includes necksurgery; Back surgery; Foot surgery (Right); Elbow surgery (Left); Carpal tunnel release (Right); Nose surgery; Partial hysterectomy; and Total shoulder replacement (Bilateral). Tobacco:  reports that she has been smoking Cigarettes.  She has a 40.00 pack-year smoking history. She has never used smokeless tobacco. Alcohol:  reports that she drinks alcohol. Drug:  reports that she does not use drugs.  Constitutional Exam  General appearance: Well nourished, well developed, and well hydrated. In no acute distress Vitals:   04/24/16 1104  BP: (!) 148/94  Pulse: 97  Resp: 18  Temp: 98.4 F (36.9 C)  SpO2: 100%  Weight: 230 lb (104.3 kg)  Height: 5\' 5"  (1.651 m)  BMI Assessment: Estimated body mass index is 38.27 kg/m as calculated from the following:   Height as of this encounter: 5\' 5"  (1.651 m).   Weight as of this encounter: 230 lb (104.3 kg).  BMI interpretation: (35-39.9 kg/m2) = Severe obesity (Class II): This range is associated with a 136% higher incidence of chronic pain. BMI Readings from Last 4 Encounters:  04/24/16 38.27 kg/m  04/09/16 36.94 kg/m  04/04/16 37.76 kg/m  03/12/16 39.94 kg/m   Wt Readings from Last 4 Encounters:  04/24/16 230 lb (104.3 kg)  04/09/16 222 lb (100.7 kg)  04/04/16 220 lb (99.8 kg)  03/12/16 240 lb (108.9 kg)  Psych/Mental status: Alert and oriented x 3 (person, place, & time) Eyes: PERLA Respiratory:  No evidence of acute respiratory distress  Cervical Spine Exam  Inspection: No masses, redness, or swelling Alignment: Symmetrical Functional ROM: Unrestricted ROM Stability: No instability detected Muscle strength & Tone: Functionally intact Sensory: Unimpaired Palpation: Non-contributory  Upper Extremity (UE) Exam    Side: Right upper extremity  Side: Left upper extremity  Inspection: No masses, redness, swelling, or asymmetry  Inspection: No masses, redness, swelling, or asymmetry  Functional ROM: Diminished ROMfor shoulder joint  Functional ROM: Diminished ROM for shoulder joint  Muscle strength & Tone: Functionally intact  Muscle strength & Tone: Functionally intact  Sensory: Unimpaired  Sensory: Unimpaired  Palpation: Non-contributory  Palpation: Non-contributory   Thoracic Spine Exam  Inspection: No masses, redness, or swelling Alignment: Symmetrical Functional ROM: Unrestricted ROM Stability: No instability detected Sensory: Unimpaired Muscle strength & Tone: Functionally intact Palpation: Non-contributory  Lumbar Spine Exam  Inspection: No masses, redness, or swelling Alignment: Symmetrical Functional ROM: Unrestricted ROM Stability: No instability detected Muscle strength & Tone: Functionally intact Sensory: Unimpaired Palpation: Non-contributory Provocative Tests: Lumbar Hyperextension and rotation test: evaluation deferred today       Patrick's Maneuver: evaluation deferred today              Gait & Posture Assessment  Ambulation: Unassisted Gait: Relatively normal for age and body habitus Posture: WNL   Lower Extremity Exam    Side: Right lower extremity  Side: Left lower extremity  Inspection: No masses, redness, swelling, or asymmetry  Inspection: No masses, redness, swelling, or asymmetry  Functional ROM: Unrestricted ROM          Functional ROM: Unrestricted ROM          Muscle strength & Tone: Functionally intact  Muscle strength & Tone: Functionally  intact  Sensory: Unimpaired  Sensory: Unimpaired  Palpation: Non-contributory  Palpation: Non-contributory   Assessment  Primary Diagnosis & Pertinent Problem List: The primary encounter diagnosis was Chronic pain syndrome. Diagnoses of Chronic shoulder pain (Location of Primary Source of Pain) (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), Status post total replacement of right shoulder, Primary osteoarthritis of both hips, and Chronic hip pain, unspecified laterality were also pertinent to this visit.  Visit Diagnosis: 1. Chronic pain syndrome   2. Chronic shoulder pain (Location of Primary Source of Pain) (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)   3. Status post total replacement of right shoulder   4. Primary osteoarthritis of both hips   5. Chronic hip pain, unspecified laterality    Plan of Care  Pharmacotherapy (Medications Ordered): Meds ordered this encounter  Medications  . Oxycodone HCl 10 MG TABS    Sig: Take 1 tablet (10 mg total) by mouth every 6 (six) hours as needed.    Dispense:  120 tablet    Refill:  0    Do not add this medication to the electronic "Automatic Refill" notification system. Patient may have prescription filled one day early if pharmacy is closed on scheduled refill date. Do not fill until: 05/04/16 To last until: 06/03/16  .  Oxycodone HCl 10 MG TABS    Sig: Take 1 tablet (10 mg total) by mouth every 6 (six) hours as needed.    Dispense:  120 tablet    Refill:  0    Do not add this medication to the electronic "Automatic Refill" notification system. Patient may have prescription filled one day early if pharmacy is closed on scheduled refill date. Do not fill until: 06/03/16 To last until: 07/03/16  . Oxycodone HCl 10 MG TABS    Sig: Take 1 tablet (10 mg total) by mouth every 6 (six) hours as needed.    Dispense:  120 tablet    Refill:  0    Do not add this medication to the electronic "Automatic Refill" notification system. Patient may have prescription filled one day early if pharmacy is closed  on scheduled refill date. Do not fill until: 07/03/16 To last until: 08/02/16   New Prescriptions   No medications on file   Medications administered during this visit: Ms. Benesch had no medications administered during this visit. Lab-work, Procedure(s), & Referral(s) Ordered: Orders Placed This Encounter  Procedures  . SUPRASCAPULAR NERVE BLOCK  . HIP INJECTION   Imaging & Referral(s) Ordered: None  Interventional Therapies: Scheduled: Diagnostic Left suprascapular nerve block #2 under fluoroscopic guidance and IV sedation.  Diagnostic right-sided intra-articular hip joint injection under fluoroscopic guidance and IV sedation    Considering: Bilateral diagnostic suprascapular nerve blockunder fluoroscopic guidance and IV sedation.  Possible bilateral suprascapular nerve radiofrequencyablation, depending on the results of a diagnostic injection.  Diagnostic bilateral intra-articular hip joint injectionunder fluoroscopic guidance, with a without sedation.  Possible bilateral hip joint radiofrequencyablation under fluoroscopic guidance and IV sedation, depending on the results of the diagnostic injection.  Diagnostic bilateral lumbar facet blockunder fluoroscopic guidance and IV sedation.  Possible bilaterallumbar facet radiofrequencyablation under fluoroscopic guidance and IV sedation the pending on the results of the diagnostic injection.  Diagnostic caudal epiduralsteroid injection under fluoroscopic guidance + diagnostic epidurogram, with or without sedation.  Possible Racz procedure. Diagnostic bilateral cervical facet blockunder fluoroscopic guidance and IV sedation.  Possible bilateral cervical facet radiofrequencyablation under fluoroscopic guidance and IV sedation.  Diagnostic right-sided cervical epidural steroid injection under fluoroscopic guidance, with or without sedation.  Diagnostic bilateral sacroiliac joint blockunder fluoroscopy, with or  without sedation. Possible bilateral sacroiliac joint RFAunder fluoroscopy and IV sedation.   PRN Procedures: Bilateral diagnostic suprascapular nerve blockunder fluoroscopic guidance and IV sedation.   Diagnostic bilateral intra-articular hip joint injectionunder fluoroscopic guidance, with a without sedation.  Diagnostic bilateral lumbar facet blockunder fluoroscopic guidance and IV sedation.  Diagnostic caudal epiduralsteroid injection under fluoroscopic guidance + diagnostic epidurogram, with or without sedation.  Diagnostic bilateral cervical facet blockunder fluoroscopic guidance and IV sedation.  Diagnostic right-sided cervical epidural steroid injection under fluoroscopic guidance, with or without sedation.  Diagnostic bilateral sacroiliac joint blockunder fluoroscopy, with or without sedation.   Requested PM Follow-up: Return in about 3 months (around 07/25/2016) for Med-Mgmt, In addition, Schedule Procedure, (ASAA).  Future Appointments Date Time Provider [place]  04/30/2016 10:00 AM Mil[place] Pointer, MD [facility] None  05/29/2016 4:00 PM Robert Bellow, MD [facility] None  07/25/2016 1:15 PM Milinda Pointer, MD [facility] None    Primary Care Physician: [facility] PRIMARY CARE Location: [facility] Note by: Kathlen Brunswick. Danielle [name], M.D, DABA, DABAPM, DABPM, DABIPP, FIPP  Pain Score Disclaimer: We use the NRS-11 scale. This is a self-reported, subjective measurement of pain severity with only modest accuracy. It is  used primarily to identify changes within a particular patient. It must be understood that outpatient pain scales are significantly less accurate that those used for research, where they can be applied under ideal controlled circumstances with minimal exposure to variables. In reality, the score is likely to be a combination of pain intensity and pain affect, where pain affect describes the degree of emotional arousal or  changes in action readiness caused by the sensory experience of pain. Factors such as social and work situation, setting, emotional state, anxiety levels, expectation, and prior pain experience may influence pain perception and show large inter-individual differences that may also be affected by time variables.  Patient instructions provided during this appointment: Patient Instructions  Smoking Cessation, Tips for Success If you are ready to quit smoking, congratulations! You have chosen to help yourself be healthier. Cigarettes bring nicotine, tar, carbon monoxide, and other irritants into your body. Your lungs, heart, and blood vessels will be able to work better without these poisons. There are many different ways to quit smoking. Nicotine gum, nicotine patches, a nicotine inhaler, or nicotine nasal spray can help with physical craving. Hypnosis, support groups, and medicines help break the habit of smoking. WHAT THINGS CAN I DO TO MAKE QUITTING EASIER?  Here are some tips to help you quit for good:  Pick a date when you will quit smoking completely. Tell all of your friends and family about your plan to quit on that date.  Do not try to slowly cut down on the number of cigarettes you are smoking. Pick a quit date and quit smoking completely starting on that day.  Throw away all cigarettes.   Clean and remove all ashtrays from your home, work, and car.  On a card, write down your reasons for quitting. Carry the card with you and read it when you get the urge to smoke.  Cleanse your body of nicotine. Drink enough water and fluids to keep your urine clear or pale yellow. Do this after quitting to flush the nicotine from your body.  Learn to predict your moods. Do not let a bad situation be your excuse to have a cigarette. Some situations in your life might tempt you into wanting a cigarette.  Never have "just one" cigarette. It leads to wanting another and another. Remind yourself of your  decision to quit.  Change habits associated with smoking. If you smoked while driving or when feeling stressed, try other activities to replace smoking. Stand up when drinking your coffee. Brush your teeth after eating. Sit in a different chair when you read the paper. Avoid alcohol while trying to quit, and try to drink fewer caffeinated beverages. Alcohol and caffeine may urge you to smoke.  Avoid foods and drinks that can trigger a desire to smoke, such as sugary or spicy foods and alcohol.  Ask people who smoke not to smoke around you.  Have something planned to do right after eating or having a cup of coffee. For example, plan to take a walk or exercise.  Try a relaxation exercise to calm you down and decrease your stress. Remember, you may be tense and nervous for the first 2 weeks after you quit, but this will pass.  Find new activities to keep your hands busy. Play with a pen, coin, or rubber band. Doodle or draw things on paper.  Brush your teeth right after eating. This will help cut down on the craving for the taste of tobacco after meals. You can also  try mouthwash.   Use oral substitutes in place of cigarettes. Try using lemon drops, carrots, cinnamon sticks, or chewing gum. Keep them handy so they are available when you have the urge to smoke.  When you have the urge to smoke, try deep breathing.  Designate your home as a nonsmoking area.  If you are a heavy smoker, ask your health care provider about a prescription for nicotine chewing gum. It can ease your withdrawal from nicotine.  Reward yourself. Set aside the cigarette money you save and buy yourself something nice.  Look for support from others. Join a support group or smoking cessation program. Ask someone at home or at work to help you with your plan to quit smoking.  Always ask yourself, "Do I need this cigarette or is this just a reflex?" Tell yourself, "Today, I choose not to smoke," or "I do not want to smoke."  You are reminding yourself of your decision to quit.  Do not replace cigarette smoking with electronic cigarettes (commonly called e-cigarettes). The safety of e-cigarettes is unknown, and some may contain harmful chemicals.  If you relapse, do not give up! Plan ahead and think about what you will do the next time you get the urge to smoke. HOW WILL I FEEL WHEN I QUIT SMOKING? You may have symptoms of withdrawal because your body is used to nicotine (the addictive substance in cigarettes). You may crave cigarettes, be irritable, feel very hungry, cough often, get headaches, or have difficulty concentrating. The withdrawal symptoms are only temporary. They are strongest when you first quit but will go away within 10-14 days. When withdrawal symptoms occur, stay in control. Think about your reasons for quitting. Remind yourself that these are signs that your body is healing and getting used to being without cigarettes. Remember that withdrawal symptoms are easier to treat than the major diseases that smoking can cause.  Even after the withdrawal is over, expect periodic urges to smoke. However, these cravings are generally short lived and will go away whether you smoke or not. Do not smoke! WHAT RESOURCES ARE AVAILABLE TO HELP ME QUIT SMOKING? Your health care provider can direct you to community resources or hospitals for support, which may include:  Group support.  Education.  Hypnosis.  Therapy.   This information is not intended to replace advice given to you by your health care provider. Make sure you discuss any questions you have with your health care provider.   Document Released: 03/29/2004 Document Revised: 07/22/2014 Document Reviewed: 12/17/2012 Elsevier Interactive Patient Education 2016 [employer]. Preparing for Procedure with Sedation Instructions: . Oral Intake: Do not eat or drink anything for at least 8 hours prior to your procedure. . Transportation: Public  transportation is not allowed. Bring an adult driver. The driver must be physically present in our waiting room before any procedure can be started. Marland Kitchen Physical Assistance: Bring an adult capable of physically assisting you, in the event you need help. . Blood Pressure Medicine: Take your blood pressure medicine with a sip of water the morning of the procedure. . Insulin: Take only  of your normal insulin dose. . Preventing infections: Shower with an antibacterial soap the morning of your procedure. . Build-up your immune system: Take 1000 mg of Vitamin C with every meal (3 times a day) the day prior to your procedure. . Pregnancy: If you are pregnant, call and cancel the procedure. . Sickness: If you have a cold, fever, or any active infections, call and  cancel the procedure. . Arrival: You must be in the facility at least 30 minutes prior to your scheduled procedure. . Children: Do not bring children with you. . Dress appropriately: Bring dark clothing that you would not mind if they get stained. . Valuables: Do not bring any jewelry or valuables. Procedure appointments are reserved for interventional treatments only. Marland Kitchen No Prescription Refills. . No medication changes will be discussed during procedure appointments. . No disability issues will be discussed.

## 2016-04-30 ENCOUNTER — Ambulatory Visit
Admission: RE | Admit: 2016-04-30 | Discharge: 2016-04-30 | Disposition: A | Discharge status: Home / Self Care | Payer: Medicaid Other | Source: Ambulatory Visit | Attending: Pain Medicine | Admitting: Pain Medicine

## 2016-04-30 ENCOUNTER — Encounter: Payer: Self-pay | Admitting: Pain Medicine

## 2016-04-30 ENCOUNTER — Ambulatory Visit (HOSPITAL_BASED_OUTPATIENT_CLINIC_OR_DEPARTMENT_OTHER): Payer: Medicaid Other | Admitting: Pain Medicine

## 2016-04-30 VITALS — BP 148/81 | HR 78 | Temp 97.4°F | Resp 16 | Ht 65.0 in | Wt 230.0 lb

## 2016-04-30 DIAGNOSIS — Z885 Allergy status to narcotic agent status: Secondary | ICD-10-CM | POA: Diagnosis not present

## 2016-04-30 DIAGNOSIS — Z91041 Radiographic dye allergy status: Secondary | ICD-10-CM | POA: Diagnosis not present

## 2016-04-30 DIAGNOSIS — Z96611 Presence of right artificial shoulder joint: Secondary | ICD-10-CM

## 2016-04-30 DIAGNOSIS — Z888 Allergy status to other drugs, medicaments and biological substances status: Secondary | ICD-10-CM | POA: Diagnosis not present

## 2016-04-30 DIAGNOSIS — M25559 Pain in unspecified hip: Secondary | ICD-10-CM | POA: Diagnosis not present

## 2016-04-30 DIAGNOSIS — Z881 Allergy status to other antibiotic agents status: Secondary | ICD-10-CM | POA: Diagnosis not present

## 2016-04-30 DIAGNOSIS — G8929 Other chronic pain: Secondary | ICD-10-CM | POA: Insufficient documentation

## 2016-04-30 DIAGNOSIS — M25511 Pain in right shoulder: Secondary | ICD-10-CM

## 2016-04-30 DIAGNOSIS — M160 Bilateral primary osteoarthritis of hip: Secondary | ICD-10-CM | POA: Diagnosis not present

## 2016-04-30 DIAGNOSIS — M25512 Pain in left shoulder: Secondary | ICD-10-CM | POA: Insufficient documentation

## 2016-04-30 DIAGNOSIS — M25551 Pain in right hip: Secondary | ICD-10-CM | POA: Insufficient documentation

## 2016-04-30 DIAGNOSIS — Z79899 Other long term (current) drug therapy: Secondary | ICD-10-CM | POA: Diagnosis not present

## 2016-04-30 MED ORDER — LIDOCAINE HCL (PF) 1 % IJ SOLN: 10.0000 mL | Freq: Once | INTRAMUSCULAR | Status: DC

## 2016-04-30 MED ORDER — TRIAMCINOLONE ACETONIDE 40 MG/ML IJ SUSP: 40.0000 mg | Freq: Once | INTRAMUSCULAR | Status: DC

## 2016-04-30 MED ORDER — LACTATED RINGERS IV SOLN
1000.0000 mL | Freq: Once | INTRAVENOUS | Status: AC
  Administered 2016-04-30: 1000 mL via INTRAVENOUS

## 2016-04-30 MED ORDER — LIDOCAINE HCL (PF) 1 % IJ SOLN
10.0000 mL | Freq: Once | INTRAMUSCULAR | Status: AC
  Administered 2016-04-30: 10 mL

## 2016-04-30 MED ORDER — ROPIVACAINE HCL 2 MG/ML IJ SOLN: 4.0000 mL | Freq: Once | INTRAMUSCULAR | Status: DC

## 2016-04-30 MED ORDER — ROPIVACAINE HCL 2 MG/ML IJ SOLN
INTRAMUSCULAR | Status: AC
  Administered 2016-04-30: 11:00:00
  Filled 2016-04-30: qty 10

## 2016-04-30 MED ORDER — ROPIVACAINE HCL 2 MG/ML IJ SOLN
4.0000 mL | Freq: Once | INTRAMUSCULAR | Status: AC
  Administered 2016-04-30: 4 mL
  Filled 2016-04-30: qty 10

## 2016-04-30 MED ORDER — METHYLPREDNISOLONE ACETATE 80 MG/ML IJ SUSP
80.0000 mg | Freq: Once | INTRAMUSCULAR | Status: AC
  Administered 2016-04-30: 80 mg
  Filled 2016-04-30: qty 1

## 2016-04-30 MED ORDER — MIDAZOLAM HCL 5 MG/5ML IJ SOLN
1.0000 mg | INTRAMUSCULAR | Status: DC | PRN
  Administered 2016-04-30: 2 mg via INTRAVENOUS
  Filled 2016-04-30: qty 5

## 2016-04-30 MED ORDER — IOPAMIDOL (ISOVUE-M 200) INJECTION 41%: 10.0000 mL | Freq: Once | INTRAMUSCULAR | Status: DC

## 2016-04-30 MED ORDER — TRIAMCINOLONE ACETONIDE 40 MG/ML IJ SUSP
INTRAMUSCULAR | Status: AC
  Administered 2016-04-30: 11:00:00
  Filled 2016-04-30: qty 1

## 2016-04-30 MED ORDER — FENTANYL CITRATE (PF) 100 MCG/2ML IJ SOLN
25.0000 ug | INTRAMUSCULAR | Status: DC | PRN
  Administered 2016-04-30: 50 ug via INTRAVENOUS
  Filled 2016-04-30: qty 2

## 2016-04-30 NOTE — Progress Notes (Signed)
Safety precautions to be maintained throughout the outpatient stay will include: orient to surroundings, keep bed in low position, maintain call bell within reach at all times, provide assistance with transfer out of bed and ambulation.  

## 2016-04-30 NOTE — Progress Notes (Signed)
Patient's Name: Danielle [name]  MRN: [id number]  Referring Provider: Milinda Pointer, MD  DOB: [birth year]  PCP: [facility]  DOS: 04/30/2016  Note by: Kathlen Brunswick. [name], MD  Service setting: Ambulatory outpatient  Location: [facility] (AMB) Pain Management Facility  Visit type: Procedure  Specialty: Interventional Pain Management  Patient type: Established   Primary Reason for Visit: Interventional Pain Management Treatment. CC: Shoulder Pain (left) and Hip Pain (right)  Procedure #1:  Anesthesia, Analgesia, Anxiolysis:  Type: Therapeutic Intra-Articular Hip Injection Region:  Posterolateral hip joint area. Level: Lower pelvic and hip joint level. Laterality: Right  Type: Local Anesthesia with Moderate (Conscious) Sedation Local Anesthetic: Lidocaine 1% Route: Intravenous (IV) IV Access: Secured Sedation: Meaningful verbal contact was maintained at all times during the procedure  Indication(s): Analgesia and Anxiety   Procedure #2:  Anesthesia, Analgesia, Anxiolysis:  Type: Diagnostic Suprascapular nerve Block Region: Posterior Shoulder & Scapular Areas Level: Superior to the scapular spine, in the lateral aspect of the supraspinatus fossa (Suprescapular notch). Laterality: Bilateral  Type: Local Anesthesia with Moderate (Conscious) Sedation Local Anesthetic: Lidocaine 1% Route: Intravenous (IV) IV Access: Secured Sedation: Meaningful verbal contact was maintained at all times during the procedure  Indication(s): Analgesia and Anxiety   Indications: 1. Chronic hip pain, unspecified laterality   2. Primary osteoarthritis of both hips   3. Chronic shoulder pain (Location of Primary Source of Pain) (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)   4. Status post total replacement of right shoulder    Pain Score: Pre-procedure: 3 /10 Post-procedure: 0-No pain/10  Pre-Procedure Assessment:  Danielle [name] is a 55 y.o. (year old), female patient, seen today for interventional treatment. She  has a past  surgical history that includes necksurgery; Back surgery; Foot surgery (Right); Elbow surgery (Left); Carpal tunnel release (Right); Nose surgery; Partial hysterectomy; and Total shoulder replacement (Bilateral).. Her primarily concern today is the Shoulder Pain (left) and Hip Pain (right) The primary encounter diagnosis was Chronic hip pain, unspecified laterality. Diagnoses of Primary osteoarthritis of both hips, Chronic shoulder pain (Location of Primary Source of Pain) (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), and Status post total replacement of right shoulder were also pertinent to this visit.  Pain Descriptors / Indicators: Sharp, Stabbing, Aching, Dull Pain Frequency: Constant  Date of Last Visit: 04/24/16 Service Provided on Last Visit: Med Refill, Evaluation  Coagulation Parameters No results found for: INR, LABPROT, APTT, PLT Verification of the correct person, correct site (including marking of site), and correct procedure were performed and confirmed by the patient.  Consent: Before the procedure and under the influence of no sedative(s), amnesic(s), or anxiolytics, the patient was informed of the treatment options, risks and possible complications. To fulfill our ethical and legal obligations, as recommended by the American Medical Association's Code of Ethics, I have informed the patient of my clinical impression; the nature and purpose of the treatment or procedure; the risks, benefits, and possible complications of the intervention; the alternatives, including doing nothing; the risk(s) and benefit(s) of the alternative treatment(s) or procedure(s); and the risk(s) and benefit(s) of doing nothing. The patient was provided information about the general risks and possible complications associated with the procedure. These may include, but are not limited to: failure to achieve desired goals, infection, bleeding, organ or nerve damage, allergic reactions, paralysis, and death. In addition, the patient was  informed of those risks and complications associated to the procedure, such as failure to decrease pain; infection; bleeding; organ or nerve damage with subsequent damage to sensory, motor, and/or autonomic systems, resulting in  permanent pain, numbness, and/or weakness of one or several areas of the body; allergic reactions; (i.e.: anaphylactic reaction); and/or death. Furthermore, the patient was informed of those risks and complications associated with the medications. These include, but are not limited to: allergic reactions (i.e.: anaphylactic or anaphylactoid reaction(s)); adrenal axis suppression; blood sugar elevation that in diabetics may result in ketoacidosis or comma; water retention that in patients with history of congestive heart failure may result in shortness of breath, pulmonary edema, and decompensation with resultant heart failure; weight gain; swelling or edema; medication-induced neural toxicity; particulate matter embolism and blood vessel occlusion with resultant organ, and/or nervous system infarction; and/or aseptic necrosis of one or more joints. Finally, the patient was informed that Medicine is not an exact science; therefore, there is also the possibility of unforeseen or unpredictable risks and/or possible complications that may result in a catastrophic outcome. The patient indicated having understood very clearly. We have given the patient no guarantees and we have made no promises. Enough time was given to the patient to ask questions, all of which were answered to the patient's satisfaction. Danielle [name] has indicated that she wanted to continue with the procedure.  Consent Attestation: I, the ordering provider, attest that I have discussed with the patient the benefits, risks, side-effects, alternatives, likelihood of achieving goals, and potential problems during recovery for the procedure that I have provided informed consent.  Pre-Procedure Preparation:  Safety  Precautions: Allergies reviewed. The patient was asked about blood thinners, or active infections, both of which were denied. The patient was asked to confirm the procedure and laterality, before marking the site, and again before commencing the procedure. Appropriate site, procedure, and patient were confirmed by following the Joint Commission's Universal Protocol (UP.01.01.01), in the form of a "Time Out". The patient was asked to participate by confirming the accuracy of the "Time Out" information. Patient was assessed for positional comfort and pressure points before starting the procedure. Allergies: She is allergic to aripiprazole; duloxetine; gabapentin; gold; iodinated diagnostic agents; naproxen sodium; nortriptyline hcl; nsaids; pregabalin; ace inhibitors; aspirin; cefpodoxime; fluoxetine; fluticasone-salmeterol; levofloxacin; lithium; meperidine; paroxetine; tape; telithromycin; theophylline; topiramate; trazodone; triamcinolone; venlafaxine; bupropion; ketorolac tromethamine; morphine; and moxifloxacin.. Allergy Precautions: None required Infection Control Precautions: Sterile technique used. Standard Universal Precautions were taken as recommended by the Department of [facility] for Disease Control and Prevention (CDC). Standard pre-surgical skin prep was conducted. Respiratory hygiene and cough etiquette was practiced. Hand hygiene observed. Safe injection practices and needle disposal techniques followed. SDV (single dose vial) medications used. Medications properly checked for expiration dates and contaminants. Personal protective equipment (PPE) used as per protocol. Monitoring:  As per clinic protocol. Vitals:   04/30/16 1121 04/30/16 1127 04/30/16 1137 04/30/16 1147  BP: (!) 112/95 (!) 149/75 (!) 157/87 (!) 148/81  Pulse: 85 76 77 78  Resp: 14 16 17 16   Temp:  97.4 F (36.3 C)  97.4 F (36.3 C)  SpO2: 99% 97% 98% 97%  Weight:      Height:      Calculated BMI: Body mass  index is 38.27 kg/m. Time-out: "Time-out" completed before starting procedure, as per protocol.  Description of Procedure #1 Process:   Time-out: "Time-out" completed before starting procedure, as per protocol. Position: Left Lateral Decubitus. Target Area: Superior aspect of the hip joint cavity, going thru the superior portion of the capsular ligament. Approach: Posterolateral approach. Area Prepped: Entire Posterolateral hip area. Prepping solution: ChloraPrep (2% chlorhexidine gluconate and 70% isopropyl alcohol) Safety Precautions: Aspiration  looking for blood return was conducted prior to all injections. At no point did we inject any substances, as a needle was being advanced. No attempts were made at seeking any paresthesias. Safe injection practices and needle disposal techniques used. Medications properly checked for expiration dates. SDV (single dose vial) medications used. Description of the Procedure: Protocol guidelines were followed. The patient was placed in position over the fluoroscopy table. The target area was identified and the area prepped in the usual manner. Skin & deeper tissues infiltrated with local anesthetic. Appropriate amount of time allowed to pass for local anesthetics to take effect. The procedure needles were then advanced to the target area. Proper needle placement secured. Negative aspiration confirmed. Solution injected in intermittent fashion, asking for systemic symptoms every 0.5cc of injectate. The needles were then removed and the area cleansed, making sure to leave some of the prepping solution back to take advantage of its long term bactericidal properties. EBL: Minimal Materials & Medications Used:  Needle(s) Used: 22g - 7" Spinal Needle(s) Solution Injected: 0.2% PF-Ropivacaine (40ml) + SDV-DepoMedrol 80 mg/ml (91ml) Medications Administered today: We administered fentaNYL, lactated ringers, midazolam, methylPREDNISolone acetate, ropivacaine (PF) 2 mg/ml  (0.2%), lidocaine (PF), ropivacaine (PF) 2 mg/ml (0.2%), and triamcinolone acetonide.Please see chart orders for dosing details.  Imaging Guidance #1 (Non-Spinal):  Type of Imaging Technique: Fluoroscopy Guidance (Non-Spinal) Indication(s): Assistance in needle guidance and placement for procedures requiring needle placement in or near specific anatomical locations not easily accessible without such assistance. Exposure Time: Please see nurses notes. Contrast: None used. Fluoroscopic Guidance: I was personally present during the use of fluoroscopy. "Tunnel Vision Technique" used to obtain the best possible view of the target area. Parallax error corrected before commencing the procedure. "Direction-depth-direction" technique used to introduce the needle under continuous pulsed fluoroscopy. Once target was reached, antero-posterior, oblique, and lateral fluoroscopic projection used confirm needle placement in all planes. Images permanently stored in EMR. Interpretation: No contrast injected.  Description of Procedure #2 Process:   Time-out: "Time-out" completed before starting procedure, as per protocol. Position: Prone", Target Area: Suprascapular notch. Approach: Posterior approach. Area Prepped: Entire shoulder Area Prepping solution: ChloraPrep (2% chlorhexidine gluconate and 70% isopropyl alcohol) Safety Precautions: Aspiration looking for blood return was conducted prior to all injections. At no point did we inject any substances, as a needle was being advanced. No attempts were made at seeking any paresthesias. Safe injection practices and needle disposal techniques used. Medications properly checked for expiration dates. SDV (single dose vial) medications used. Description of the Procedure: Protocol guidelines were followed. The patient was placed in position over the procedure table. The target area was identified and the area prepped in the usual manner. Skin & deeper tissues infiltrated  with local anesthetic. Appropriate amount of time allowed to pass for local anesthetics to take effect. The procedure needles were then advanced to the target area. Proper needle placement secured. Negative aspiration confirmed. Solution injected in intermittent fashion, asking for systemic symptoms every 0.5cc of injectate. The needles were then removed and the area cleansed, making sure to leave some of the prepping solution back to take advantage of its long term bactericidal properties. EBL: None Materials & Medications Used:  Needle(s) Used: 22g - 3.5" Spinal Needle(s) Solution Injected: 0.2% PF-Ropivacaine (29ml) + SDV-Decadron 10 mg/ml (52ml) Medications Administered today: Ms. Seidle had no medications administered during this visit.Please see chart orders for dosing details.  Imaging Guidance #2 (Non-Spinal):  Type of Imaging Technique: Fluoroscopy Guidance (Non-Spinal) Indication(s): Assistance in needle  guidance and placement for procedures requiring needle placement in or near specific anatomical locations not easily accessible without such assistance. Exposure Time: Please see nurses notes. Contrast: None used. Fluoroscopic Guidance: I was personally present during the use of fluoroscopy. "Tunnel Vision Technique" used to obtain the best possible view of the target area. Parallax error corrected before commencing the procedure. "Direction-depth-direction" technique used to introduce the needle under continuous pulsed fluoroscopy. Once target was reached, antero-posterior, oblique, and lateral fluoroscopic projection used confirm needle placement in all planes. Images permanently stored in EMR. Interpretation: No contrast injected.  Antibiotic Prophylaxis:  Indication(s): No indications identified. Type:  Antibiotics Given (last 72 hours)    None      Post-operative Assessment:  Complications: No immediate post-treatment complications observed by team, or reported by  patient. Disposition: The patient tolerated the entire procedure well. A repeat set of vitals were taken after the procedure and the patient was kept under observation following institutional policy, for this type of procedure. Post-procedural neurological assessment was performed, showing return to baseline, prior to discharge. The patient was provided with post-procedure discharge instructions, including a section on how to identify potential problems. Should any problems arise concerning this procedure, the patient was given instructions to immediately contact us, at any time, without hesitation. In any case, we plan to contact the patient by telephone for a follow-up status report regarding this interventional procedure. Comments:  No additional relevant information.  Plan of Care  Discharge to: Discharge home  Medications ordered for procedure: Meds ordered this encounter  Medications  . fentaNYL (SUBLIMAZE) injection 25-50 mcg    Make sure Narcan is available in the pyxis when using this medication. In the event of respiratory depression (RR< 8/min): Titrate NARCAN (naloxone) in increments of 0.1 to 0.2 mg IV at 2-3 minute intervals, until desired degree of reversal.  . lactated ringers infusion 1,000 mL  . midazolam (VERSED) 5 MG/5ML injection 1-2 mg    Make sure Flumazenil is available in the pyxis when using this medication. If oversedation occurs, administer 0.2 mg IV over 15 sec. If after 45 sec no response, administer 0.2 mg again over 1 min; may repeat at 1 min intervals; not to exceed 4 doses (1 mg)  . methylPREDNISolone acetate (DEPO-MEDROL) injection 80 mg  . iopamidol (ISOVUE-M) 41 % intrathecal injection 10 mL  . lidocaine (PF) (XYLOCAINE) 1 % injection 10 mL  . ropivacaine (PF) 2 mg/ml (0.2%) (NAROPIN) epidural 4 mL  . ropivacaine (PF) 2 mg/ml (0.2%) (NAROPIN) epidural 4 mL  . lidocaine (PF) (XYLOCAINE) 1 % injection 10 mL  . triamcinolone acetonide (KENALOG-40) injection 40  mg  . ropivacaine (PF) 2 mg/ml (0.2%) (NAROPIN) 2 MG/ML epidural    Donneta Romberg, Dena: cabinet override  . triamcinolone acetonide (KENALOG-40) 40 MG/ML injection    Donneta Romberg, Dena: cabinet override   Medications administered: (For more details, see medical record) We administered fentaNYL, lactated ringers, midazolam, methylPREDNISolone acetate, ropivacaine (PF) 2 mg/ml (0.2%), lidocaine (PF), ropivacaine (PF) 2 mg/ml (0.2%), and triamcinolone acetonide.  Imaging Ordered: Results for orders placed in visit on 04/09/16  DG C-Arm 1-60 Min-No Report   Narrative CLINICAL DATA: Sub-acute chronic pain syndrome   C-ARM 1-60 MINUTES  Fluoroscopy was utilized by the requesting physician.  No radiographic  interpretation.     New Prescriptions   No medications on file   Physician-requested Follow-up:  Return in about 2 weeks (around 05/14/2016) for Post-Procedure evaluation.  Future Appointments Date Time Provider [place]  05/27/2016  1:45 PM Milinda Pointer, MD [facility] None  05/29/2016 4:00 PM Robert Bellow, MD [facility] None  07/25/2016 1:15 PM Milinda Pointer, MD [facility] None   Primary Care Physician: [facility] Location: [facility] Note by: Kathlen Brunswick. [name], M.D, DABA, DABAPM, DABPM, DABIPP, FIPP  Disclaimer:  Medicine is not an exact science. The only guarantee in medicine is that nothing is guaranteed. It is important to note that the decision to proceed with this intervention was based on the information collected from the patient. The Data and conclusions were drawn from the patient's questionnaire, the interview, and the physical examination. Because the information was provided in large part by the patient, it cannot be guaranteed that it has not been purposely or unconsciously manipulated. Every effort has been made to obtain as much relevant data as possible for this evaluation. It is important to note that the  conclusions that lead to this procedure are derived in large part from the available data. Always take into account that the treatment will also be dependent on availability of resources and existing treatment guidelines, considered by other Pain Management Practitioners as being common knowledge and practice, at the time of the intervention. For Medico-Legal purposes, it is also important to point out that variation in procedural techniques and pharmacological choices are the acceptable norm. The indications, contraindications, technique, and results of the above procedure should only be interpreted and judged by a Board-Certified Interventional Pain Specialist with extensive familiarity and expertise in the same exact procedure and technique. Attempts at providing opinions without similar or greater experience and expertise than that of the treating physician will be considered as inappropriate and unethical, and shall result in a formal complaint to the state medical board and applicable specialty societies.  Instructions provided at this appointment: Patient Instructions  Pain Management Discharge Instructions  General Discharge Instructions :  If you need to reach your doctor call: Monday-Friday 8:00 am - 4:00 pm at [phone] or toll free [phone].  After clinic hours [phone] to have operator reach doctor.  Bring all of your medication bottles to all your appointments in the pain clinic.  To cancel or reschedule your appointment with Pain Management please remember to call 24 hours in advance to avoid a fee.  Refer to the educational materials which you have been given on: General Risks, I had my Procedure. Discharge Instructions, Post Sedation.  Post Procedure Instructions:  The drugs you were given will stay in your system until tomorrow, so for the next 24 hours you should not drive, make any legal decisions or drink any alcoholic beverages.  You may eat anything you prefer,  but it is better to start with liquids then soups and crackers, and gradually work up to solid foods.  Please notify your doctor immediately if you have any unusual bleeding, trouble breathing or pain that is not related to your normal pain.  Depending on the type of procedure that was done, some parts of your body may feel week and/or numb.  This usually clears up by tonight or the next day.  Walk with the use of an assistive device or accompanied by an adult for the 24 hours.  You may use ice on the affected area for the first 24 hours.  Put ice in a Ziploc bag and cover with a towel and place against area 15 minutes on 15 minutes off.  You may switch to heat after 24 hours.

## 2016-04-30 NOTE — Patient Instructions (Signed)

## 2016-05-01 ENCOUNTER — Telehealth: Payer: Self-pay

## 2016-05-01 NOTE — Telephone Encounter (Signed)
Post procedure call. Left message with patient.

## 2016-05-24 ENCOUNTER — Encounter: Payer: Self-pay | Admitting: *Deleted

## 2016-05-27 ENCOUNTER — Ambulatory Visit: Payer: Medicaid Other | Admitting: Pain Medicine

## 2016-05-29 ENCOUNTER — Ambulatory Visit: Payer: Self-pay | Admitting: General Surgery

## 2016-07-22 ENCOUNTER — Ambulatory Visit: Payer: Self-pay | Admitting: General Surgery

## 2016-07-25 ENCOUNTER — Ambulatory Visit: Payer: Medicaid Other | Attending: Pain Medicine | Admitting: Pain Medicine

## 2016-07-25 ENCOUNTER — Encounter: Payer: Self-pay | Admitting: Pain Medicine

## 2016-07-25 VITALS — BP 148/94 | HR 96 | Temp 98.4°F | Resp 16 | Ht 65.0 in | Wt 250.0 lb

## 2016-07-25 DIAGNOSIS — M25559 Pain in unspecified hip: Secondary | ICD-10-CM | POA: Diagnosis not present

## 2016-07-25 DIAGNOSIS — M25551 Pain in right hip: Secondary | ICD-10-CM | POA: Insufficient documentation

## 2016-07-25 DIAGNOSIS — M25511 Pain in right shoulder: Secondary | ICD-10-CM

## 2016-07-25 DIAGNOSIS — I10 Essential (primary) hypertension: Secondary | ICD-10-CM | POA: Insufficient documentation

## 2016-07-25 DIAGNOSIS — Z9660 Presence of unspecified orthopedic joint implant: Secondary | ICD-10-CM | POA: Insufficient documentation

## 2016-07-25 DIAGNOSIS — E039 Hypothyroidism, unspecified: Secondary | ICD-10-CM | POA: Insufficient documentation

## 2016-07-25 DIAGNOSIS — G894 Chronic pain syndrome: Secondary | ICD-10-CM

## 2016-07-25 DIAGNOSIS — M4316 Spondylolisthesis, lumbar region: Secondary | ICD-10-CM | POA: Insufficient documentation

## 2016-07-25 DIAGNOSIS — M4310 Spondylolisthesis, site unspecified: Secondary | ICD-10-CM | POA: Diagnosis not present

## 2016-07-25 DIAGNOSIS — M545 Low back pain: Secondary | ICD-10-CM | POA: Insufficient documentation

## 2016-07-25 DIAGNOSIS — M25512 Pain in left shoulder: Secondary | ICD-10-CM

## 2016-07-25 DIAGNOSIS — M1288 Other specific arthropathies, not elsewhere classified, other specified site: Secondary | ICD-10-CM

## 2016-07-25 DIAGNOSIS — R531 Weakness: Secondary | ICD-10-CM | POA: Diagnosis not present

## 2016-07-25 DIAGNOSIS — Z6841 Body Mass Index (BMI) 40.0 and over, adult: Secondary | ICD-10-CM

## 2016-07-25 DIAGNOSIS — G8929 Other chronic pain: Secondary | ICD-10-CM

## 2016-07-25 DIAGNOSIS — M47816 Spondylosis without myelopathy or radiculopathy, lumbar region: Secondary | ICD-10-CM

## 2016-07-25 DIAGNOSIS — Z79891 Long term (current) use of opiate analgesic: Secondary | ICD-10-CM

## 2016-07-25 DIAGNOSIS — R29898 Other symptoms and signs involving the musculoskeletal system: Secondary | ICD-10-CM | POA: Diagnosis not present

## 2016-07-25 DIAGNOSIS — Z981 Arthrodesis status: Secondary | ICD-10-CM | POA: Insufficient documentation

## 2016-07-25 DIAGNOSIS — M5441 Lumbago with sciatica, right side: Secondary | ICD-10-CM

## 2016-07-25 DIAGNOSIS — F1190 Opioid use, unspecified, uncomplicated: Secondary | ICD-10-CM | POA: Diagnosis not present

## 2016-07-25 DIAGNOSIS — M797 Fibromyalgia: Secondary | ICD-10-CM | POA: Insufficient documentation

## 2016-07-25 DIAGNOSIS — J302 Other seasonal allergic rhinitis: Secondary | ICD-10-CM | POA: Diagnosis not present

## 2016-07-25 MED ORDER — OXYCODONE HCL 10 MG PO TABS: 10.0000 mg | ORAL_TABLET | Freq: Four times a day (QID) | ORAL | 0 refills | Status: DC | PRN

## 2016-07-25 NOTE — Patient Instructions (Signed)

## 2016-07-25 NOTE — Progress Notes (Signed)
Nursing Pain Medication Assessment:  Safety precautions to be maintained throughout the outpatient stay will include: orient to surroundings, keep bed in low position, maintain call bell within reach at all times, provide assistance with transfer out of bed and ambulation.  Medication Inspection Compliance: Pill count conducted under aseptic conditions, in front of the patient. Neither the pills nor the bottle was removed from the patient's sight at any time. Once count was completed pills were immediately returned to the patient in their original bottle.  Medication: See above Pill Count: 30 of 120 pills remain Bottle Appearance: Standard pharmacy container. Clearly labeled. Filled Date: 12 /19 / 2017 Medication last intake:07/25/16 a.m.

## 2016-07-25 NOTE — Progress Notes (Signed)
Patient's Name: Danielle [name]  MRN: [id number]  Referring Provider: [facility] [facility]  DOB: 1961/05/19  PCP: [facility]  DOS: 07/25/2016  Note by: Kathlen Brunswick. [name], MD  Service setting: Ambulatory outpatient  Specialty: Interventional Pain Management  Location: [facility] (AMB) Pain Management Facility    Patient type: Established   Primary Reason(s) for Visit: Encounter for prescription drug management & post-procedure evaluation of chronic illness with mild to moderate exacerbation(Level of risk: moderate) CC: Shoulder Pain (bilateral s/p joint replacement. ); Back Pain (lower s/p fusion); and Hip Pain (right)  HPI  [name] is a 56 y.o. year old, female patient, who comes today for a post-procedure evaluation and medication management. She has Long term current use of opiate analgesic; Long term prescription opiate use; Opiate use (60 MME/Day); B12 deficiency; Cervical spinal cord compression [facility]) (April, 2017); Cervical spinal stenosis; Chronic obstructive pulmonary disease ([place]); Clinical depression; BP (high blood pressure); Adult hypothyroidism; Adiposity; Mucositis oral; Groin pain; Current tobacco use; Chronic shoulder pain (Location of Primary Source of Pain) (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); History of total shoulder replacement (Bilateral); Chronic hip pain (Location of Secondary source of pain) (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); 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); Chronic low back pain (Location of Tertiary source of pain) (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); Lumbar facet syndrome (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); Lumbar spondylosis; Failed back surgical syndrome; Chronic lower extremity pain (Right); Chronic neck pain (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); Chronic cervical radicular pain (Right); Ulnar neuropathy of left upper extremity; Hx of cervical spine surgery; Cervical spondylosis; Encounter for therapeutic drug level monitoring; Encounter for pain management planning; Fibromyalgia; Chronic sacroiliac pain (B) (R>L); Seasonal allergic rhinitis;  Status post total replacement of right shoulder; Depression; Hypertension; Hypothyroid; Chronic pain syndrome; Complaints of weakness of lower extremity; Morbid obesity with BMI of 40.0-44.9, adult ([place]); and Anterolisthesis on her problem list. Her primarily concern today is the Shoulder Pain (bilateral s/p joint replacement. ); Back Pain (lower s/p fusion); and Hip Pain (right)  Pain Assessment: Self-Reported Pain Score: 5 /10 Clinically the patient looks like a 2/10 Reported level is inconsistent with clinical observations. Information on the proper use of the pain score provided to the patient today. Pain Type: Chronic pain Pain Location:  (shoulder, lower back, right hip) Pain Orientation:  (bilateral shoulders, bilateral lower back) Pain Descriptors / Indicators: Constant, Sharp, Dull, Aching, Throbbing Pain Frequency: Constant  [name] was last seen on 04/30/2016 for a procedure. During today's appointment we reviewed Ms. Britz's post-procedure results, as well as her outpatient medication regimen.  Further details on both, my assessment(s), as well as the proposed treatment plan, please see below.  Controlled Substance Pharmacotherapy Assessment REMS (Risk Evaluation and Mitigation Strategy)  Analgesic:Oxycodone IR 10 mg every 4 hours (60 mg/day of oxycodone). Alprazolam 1 mg 3 times a day. MME/day:90 mg/day [name], RN  07/25/2016  1:39 PM  Sign at close encounter Nursing Pain Medication Assessment:  Safety precautions to be maintained throughout the outpatient stay will include: orient to surroundings, keep bed in low position, maintain call bell within reach at all times, provide assistance with transfer out of bed and ambulation.  Medication Inspection Compliance: Pill count conducted under aseptic conditions, in front of the patient. Neither the pills nor the bottle was removed from the patient's sight at any time. Once count was completed pills were immediately  returned to the patient in their original bottle.  Medication: See above Pill Count: 30 of 120 pills remain Bottle Appearance: Standard pharmacy container. Clearly labeled. Filled Date: 12 /19 / 2017 Medication  last intake:07/25/16 a.m.   Pharmacokinetics: Liberation and absorption (onset of action): WNL Distribution (time to peak effect): WNL Metabolism and excretion (duration of action): WNL         Pharmacodynamics: Desired effects: Analgesia: Ms. Moralez reports >50% benefit. Functional ability: Patient reports that medication allows her to accomplish basic ADLs Clinically meaningful improvement in function (CMIF): Sustained CMIF goals met Perceived effectiveness: Described as relatively effective, allowing for increase in activities of daily living (ADL) Undesirable effects: Side-effects or Adverse reactions: None reported Monitoring: [place] PMP: Online review of the past 57-monthperiod conducted. Compliant with practice rules and regulations List of all UDS test(s) done:  Lab Results  Component Value Date   TOXASSSELUR FINAL 04/04/2016   SUMMARY FINAL 01/02/2016   Last UDS on record: ToxAssure Select 13  Date Value Ref Range Status  04/04/2016 FINAL  Final    Comment:    ==================================================================== TOXASSURE SELECT 13 (MW) ==================================================================== Test                             Result       Flag       Units Drug Present and Declared for Prescription Verification   Oxycodone                      4030         EXPECTED   ng/mg creat   Oxymorphone                    202          EXPECTED   ng/mg creat   Noroxycodone                   6337         EXPECTED   ng/mg creat   Noroxymorphone                 77           EXPECTED   ng/mg creat    Sources of oxycodone are scheduled prescription medications.    Oxymorphone, noroxycodone, and noroxymorphone are expected    metabolites of oxycodone.  Oxymorphone is also available as a    scheduled prescription medication. ==================================================================== Test                      Result    Flag   Units      Ref Range   Creatinine              86               mg/dL      >=20 ==================================================================== Declared Medications:  The flagging and interpretation on this report are based on the  following declared medications.  Unexpected results may arise from  inaccuracies in the declared medications.  **Note: The testing scope of this panel includes these medications:  Oxycodone  **Note: The testing scope of this panel does not include following  reported medications:  Albuterol  Cetirizine (Zyrtec)  Citalopram (Celexa)  Diclofenac (Voltaren)  Levothyroxine (Synthroid)  Magnesium  Montelukast (Singulair)  Naloxone (Narcan)  Nystatin  Tiotropium (Spiriva)  Vitamin B12  Vitamin D3 ==================================================================== For clinical consultation, please call [phone] ====================================================================    UDS interpretation: Compliant          Medication Assessment Form: Reviewed. Patient indicates being compliant with therapy Treatment compliance: Compliant  Risk Assessment Profile: Aberrant behavior: See prior evaluations. None observed or detected today Comorbid factors increasing risk of overdose: See prior notes. No additional risks detected today Risk of substance use disorder (SUD): Low Opioid Risk Tool (ORT) Total Score: 3  Interpretation Table:  Score <3 = Low Risk for SUD  Score between 4-7 = Moderate Risk for SUD  Score >8 = High Risk for Opioid Abuse   Risk Mitigation Strategies:  Patient Counseling: Covered Patient-Prescriber Agreement (PPA): Present and active  Notification to other healthcare providers: Done  Pharmacologic Plan: No change in therapy, at this  time  Post-Procedure Assessment  04/30/2016 Procedure: Diagnostic right intra-articular hip joint injection + bilateral suprascapular nerve block under fluoroscopic guidance and IV sedation Post-procedure pain score: 0/10 (100% relief) Influential Factors: BMI: 41.60 kg/m Intra-procedural challenges: None observed Assessment challenges: None detected         Post-procedural side-effects, adverse reactions, or complications: None reported Reported issues: None  Sedation: Sedation provided. When no sedatives are used, the analgesic levels obtained are directly associated to the effectiveness of the local anesthetics. However, when sedation is provided, the level of analgesia obtained during the initial 1 hour following the intervention, is believed to be the result of a combination of factors. These factors may include, but are not limited to: 1. The effectiveness of the local anesthetics used. 2. The effects of the analgesic(s) and/or anxiolytic(s) used. 3. The degree of discomfort experienced by the patient at the time of the procedure. 4. The patients ability and reliability in recalling and recording the events. 5. The presence and influence of possible secondary gains and/or psychosocial factors. Reported result: Relief experienced during the 1st hour after the procedure: 90 % (Ultra-Short Term Relief) Interpretative annotation: Analgesia during this period is likely to be Local Anesthetic and/or IV Sedative (Analgesic/Anxiolitic) related.          Effects of local anesthetic: The analgesic effects attained during this period are directly associated to the localized infiltration of local anesthetics and therefore cary significant diagnostic value as to the etiological location, or anatomical origin, of the pain. Expected duration of relief is directly dependent on the pharmacodynamics of the local anesthetic used. Long-acting (4-6 hours) anesthetics used.  Reported result: Relief during the  next 4 to 6 hour after the procedure: 90 % (Short-Term Relief) Interpretative annotation: Complete relief would suggest area to be the source of the pain.          Long-term benefit: Defined as the period of time past the expected duration of local anesthetics. With the possible exception of prolonged sympathetic blockade from the local anesthetics, benefits during this period are typically attributed to, or associated with, other factors such as analgesic sensory neuropraxia, antiinflammatory effects, or beneficial biochemical changes provided by agents other than the local anesthetics Reported result: Extended relief following procedure: 90 % (Long-Term Relief) Interpretative annotation: Good relief. This could suggest inflammation to be a significant component in the etiology to the pain.          Current benefits: Defined as persistent relief that continues at this point in time.   Reported results: Treated area: 75 % Ms. Crooke reports improvement in function Interpretative annotation: Ongoing benefits would suggest effective therapeutic approach  Interpretation: Results would suggest a successful diagnostic intervention.          Laboratory Chemistry  Inflammation Markers Lab Results  Component Value Date   ESRSEDRATE 37 (H) 01/08/2016   CRP 0.9 01/08/2016   Renal Function  Lab Results  Component Value Date   BUN 8 01/08/2016   CREATININE 0.94 01/08/2016   GFRAA >60 01/08/2016   GFRNONAA >60 01/08/2016   Hepatic Function Lab Results  Component Value Date   AST 18 01/08/2016   ALT 14 01/08/2016   ALBUMIN 3.7 01/08/2016   Electrolytes Lab Results  Component Value Date   NA 137 01/08/2016   K 3.5 01/08/2016   CL 101 01/08/2016   CALCIUM 9.0 01/08/2016   MG 2.1 01/08/2016   Pain Modulating Vitamins Lab Results  Component Value Date   25OHVITD1 32 01/08/2016   25OHVITD2 2.0 01/08/2016   25OHVITD3 30 01/08/2016   VITAMINB12 >7,500 (H) 01/08/2016   Coagulation  Parameters No results found for: INR, LABPROT, APTT, PLT Cardiovascular No results found for: BNP, HGB, HCT Note: Lab results reviewed.  Recent Diagnostic Imaging Review  Dg C-arm 1-60 Min-no Report  Result Date: 04/30/2016 CLINICAL DATA: Assistance in needle guidance and placement for procedures requiring needle placement in or near specific anatomical locations not easily accessible without such assistance. C-ARM 1-60 MINUTES Fluoroscopy was utilized by the requesting physician.  No radiographic interpretation.   Dg C-arm 1-60 Min-no Report  Result Date: 04/30/2016 CLINICAL DATA: Assistance in needle guidance and placement for procedures requiring needle placement in or near specific anatomical locations not easily accessible without such assistance. C-ARM 1-60 MINUTES Fluoroscopy was utilized by the requesting physician.  No radiographic interpretation.   Note: Imaging results reviewed.          Meds  The patient has a current medication list which includes the following prescription(s): albuterol, cetirizine, vitamin d3, citalopram, cyanocobalamin, diclofenac sodium, levothyroxine, magnesium hydroxide, naloxone, nystatin, oxycodone hcl, oxycodone hcl, oxycodone hcl, and spiriva handihaler.  Current Outpatient Prescriptions on File Prior to Visit  Medication Sig  . albuterol (PROVENTIL HFA;VENTOLIN HFA) 108 (90 Base) MCG/ACT inhaler Inhale 2 puffs into the lungs every 4 (four) hours as needed for wheezing or shortness of breath.   . cetirizine (ZYRTEC) 10 MG tablet Take 10 mg by mouth daily.   . Cholecalciferol (VITAMIN D3) 2000 units capsule Take 2,000 Units by mouth daily.   . citalopram (CELEXA) 40 MG tablet Take 40 mg by mouth daily.  . cyanocobalamin (,VITAMIN B-12,) 1000 MCG/ML injection Inject into the muscle.  . diclofenac sodium (VOLTAREN) 1 % GEL Apply 2 g topically 4 (four) times daily.   Marland Kitchen levothyroxine (SYNTHROID, LEVOTHROID) 50 MCG tablet TAKE 1 TABLET (50 MCG TOTAL) BY  MOUTH DAILY AT 0600.  . magnesium hydroxide (MILK OF MAGNESIA) 400 MG/5ML suspension Take by mouth.  . naloxone (NARCAN) 2 MG/2ML injection Inject content of syringe into thigh muscle. Call 911.  . nystatin (MYCOSTATIN/NYSTOP) powder Apply topically.  Marland Kitchen SPIRIVA HANDIHALER 18 MCG inhalation capsule PLACE 1 CAPSULE (18 MCG TOTAL) INTO INHALER AND INHALE ONCE DAILY.   No current facility-administered medications on file prior to visit.    ROS  Constitutional: Denies any fever or chills Gastrointestinal: No reported hemesis, hematochezia, vomiting, or acute GI distress Musculoskeletal: Denies any acute onset joint swelling, redness, loss of ROM, or weakness Neurological: No reported episodes of acute onset apraxia, aphasia, dysarthria, agnosia, amnesia, paralysis, loss of coordination, or loss of consciousness  Allergies  Ms. Vaeth is allergic to aripiprazole; duloxetine; gabapentin; gold; iodinated diagnostic agents; naproxen sodium; nortriptyline hcl; nsaids; pregabalin; ace inhibitors; aspirin; cefpodoxime; fluoxetine; fluticasone-salmeterol; levofloxacin; lithium; meperidine; paroxetine; tape; telithromycin; theophylline; topiramate; trazodone; triamcinolone; venlafaxine; bupropion; ketorolac tromethamine; morphine; and moxifloxacin.  PFSH  Drug: Ms.  Kesinger  reports that she does not use drugs. Alcohol:  reports that she drinks alcohol. Tobacco:  reports that she has been smoking Cigarettes.  She has a 40.00 pack-year smoking history. She has never used smokeless tobacco. Medical:  has a past medical history of Anxiety; Asthma; Bell's palsy; COPD (chronic obstructive pulmonary disease) ([place]); Depression; Fibromyalgia; Heart murmur; Hepatitis C; Hiatal hernia; IBS (irritable bowel syndrome); Insomnia; and Thyroid disease. Family: family history includes Cancer in her mother; Gout in her mother; Heart disease in her father.  Past Surgical History:  Procedure Laterality Date  . BACK SURGERY     . CARPAL TUNNEL RELEASE Right   . ELBOW SURGERY Left   . FOOT SURGERY Right   . necksurgery    . NOSE SURGERY    . PARTIAL HYSTERECTOMY    . TOTAL SHOULDER REPLACEMENT Bilateral    Constitutional Exam  General appearance: Well nourished, well developed, and well hydrated. In no apparent acute distress Vitals:   07/25/16 1326  BP: (!) 148/94  Pulse: 96  Resp: 16  Temp: 98.4 F (36.9 C)  TempSrc: Oral  SpO2: 99%  Weight: 250 lb (113.4 kg)  Height: '5\' 5"'  (1.651 m)   BMI Assessment: Estimated body mass index is 41.6 kg/m as calculated from the following:   Height as of this encounter: '5\' 5"'  (1.651 m).   Weight as of this encounter: 250 lb (113.4 kg).  BMI interpretation table: BMI level Category Range association with higher incidence of chronic pain  <18 kg/m2 Underweight   18.5-24.9 kg/m2 Ideal body weight   25-29.9 kg/m2 Overweight Increased incidence by 20%  30-34.9 kg/m2 Obese (Class I) Increased incidence by 68%  35-39.9 kg/m2 Severe obesity (Class II) Increased incidence by 136%  >40 kg/m2 Extreme obesity (Class III) Increased incidence by 254%   BMI Readings from Last 4 Encounters:  07/25/16 41.60 kg/m  04/30/16 38.27 kg/m  04/24/16 38.27 kg/m  04/09/16 36.94 kg/m   Wt Readings from Last 4 Encounters:  07/25/16 250 lb (113.4 kg)  04/30/16 230 lb (104.3 kg)  04/24/16 230 lb (104.3 kg)  04/09/16 222 lb (100.7 kg)  Psych/Mental status: Alert, oriented x 3 (person, place, & time) Eyes: PERLA Respiratory: No evidence of acute respiratory distress  Cervical Spine Exam  Inspection: No masses, redness, or swelling Alignment: Symmetrical Functional ROM: Unrestricted ROM Stability: No instability detected Muscle strength & Tone: Functionally intact Sensory: Unimpaired Palpation: Non-contributory  Upper Extremity (UE) Exam    Side: Right upper extremity  Side: Left upper extremity  Inspection: No masses, redness, swelling, or asymmetry  Inspection: No  masses, redness, swelling, or asymmetry  Functional ROM: Unrestricted ROM          Functional ROM: Unrestricted ROM          Muscle strength & Tone: Functionally intact  Muscle strength & Tone: Functionally intact  Sensory: Unimpaired  Sensory: Unimpaired  Palpation: Non-contributory  Palpation: Non-contributory   Thoracic Spine Exam  Inspection: No masses, redness, or swelling Alignment: Symmetrical Functional ROM: Unrestricted ROM Stability: No instability detected Sensory: Unimpaired Muscle strength & Tone: Functionally intact Palpation: Non-contributory  Lumbar Spine Exam  Inspection: No masses, redness, or swelling Alignment: Symmetrical Functional ROM: Unrestricted ROM Stability: No instability detected Muscle strength & Tone: Functionally intact Sensory: Unimpaired Palpation: Non-contributory Provocative Tests: Lumbar Hyperextension and rotation test: evaluation deferred today       Patrick's Maneuver: evaluation deferred today  Gait & Posture Assessment  Ambulation: Unassisted Gait: Relatively normal for age and body habitus Posture: WNL   Lower Extremity Exam    Side: Right lower extremity  Side: Left lower extremity  Inspection: No masses, redness, swelling, or asymmetry  Inspection: No masses, redness, swelling, or asymmetry  Functional ROM: Unrestricted ROM          Functional ROM: Unrestricted ROM          Muscle strength & Tone: Functionally intact  Muscle strength & Tone: Functionally intact  Sensory: Unimpaired  Sensory: Unimpaired  Palpation: Non-contributory  Palpation: Non-contributory   Assessment  Primary Diagnosis & Pertinent Problem List: The primary encounter diagnosis was Chronic pain syndrome. Diagnoses of Chronic shoulder pain (Location of Primary Source of Pain) (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), Chronic hip pain, unspecified laterality, Chronic low back pain (Location of Tertiary source of pain) (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), Long term prescription opiate use,  Opiate use (60 MME/Day), Lumbar facet syndrome (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), Complaints of weakness of lower extremity, Morbid obesity with BMI of 40.0-44.9, adult ([place]), and Anterolisthesis were also pertinent to this visit.  Status Diagnosis  Stable Stable Stable 1. Chronic pain syndrome   2. Chronic shoulder pain (Location of Primary Source of Pain) (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)   3. Chronic hip pain, unspecified laterality   4. Chronic low back pain (Location of Tertiary source of pain) (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)   5. Long term prescription opiate use   6. Opiate use (60 MME/Day)   7. Lumbar facet syndrome (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)   8. Complaints of weakness of lower extremity   9. Morbid obesity with BMI of 40.0-44.9, adult ([place])   10. Anterolisthesis      Plan of Care  Pharmacotherapy (Medications Ordered): Meds ordered this encounter  Medications  . Oxycodone HCl 10 MG TABS    Sig: Take 1 tablet (10 mg total) by mouth every 6 (six) hours as needed.    Dispense:  120 tablet    Refill:  0    Do not add this medication to the electronic "Automatic Refill" notification system. Patient may have prescription filled one day early if pharmacy is closed on scheduled refill date. Do not fill until: 08/02/16 To last until: 09/01/16  . Oxycodone HCl 10 MG TABS    Sig: Take 1 tablet (10 mg total) by mouth every 6 (six) hours as needed.    Dispense:  120 tablet    Refill:  0    Do not add this medication to the electronic "Automatic Refill" notification system. Patient may have prescription filled one day early if pharmacy is closed on scheduled refill date. Do not fill until: 09/01/16 To last until: 10/01/16  . Oxycodone HCl 10 MG TABS    Sig: Take 1 tablet (10 mg total) by mouth every 6 (six) hours as needed.    Dispense:  120 tablet    Refill:  0    Do not add this medication to the electronic "Automatic Refill" notification system. Patient may have prescription filled one day early if pharmacy is closed on scheduled  refill date. Do not fill until: 10/01/16 To last until: 10/31/16   New Prescriptions   No medications on file   Medications administered today: Ms. Magallon had no medications administered during this visit. Lab-work, procedure(s), and/or referral(s): Orders Placed This Encounter  Procedures  . LUMBAR FACET(MEDIAL BRANCH NERVE BLOCK) MBNB  . DG Lumbar Spine Complete W/Bend  . Amb ref to Medical Nutrition Therapy-MNT  . Ambulatory referral to General Surgery  .  NCV with EMG(electromyography)   Imaging and/or referral(s): AMB REFERRAL TO MEDICAL NUTRITION THERAPY (MNT) AMB REFERRAL TO GENERAL SURGERY DG LUMBAR SPINE COMPLETE W/BEND 6+V  Interventional therapies: Planned, scheduled, and/or pending:   Diagnostic bilateral lumbar facet block   Considering:   Bilateral diagnostic suprascapular nerve block Possible bilateral suprascapular nerve radiofrequencyablation Diagnostic bilateral intra-articular hip joint injection Possible bilateral hip joint radiofrequencyablation Diagnostic bilateral lumbar facet block Possible bilaterallumbar facet radiofrequencyablation Diagnostic caudal epiduralsteroid injection under fluoroscopic guidance + diagnostic epidurogram Possible Racz procedure. Diagnostic bilateral cervical facet block Possible bilateral cervical facet radiofrequencyablation Diagnostic right-sided cervical epidural steroid injection Diagnostic bilateral sacroiliac joint block Possible bilateral sacroiliac joint RFA   Palliative PRN treatment(s):   Bilateral diagnostic suprascapular nerve block  Diagnostic bilateral intra-articular hip joint injection Diagnostic bilateral lumbar facet block Diagnostic caudal epiduralsteroid injection under fluoroscopic guidance + diagnostic epidurogram Diagnostic bilateral cervical facet block Diagnostic right-sided cervical epidural steroid injection Diagnostic bilateral sacroiliac joint block   Provider-requested  follow-up: Return in about 3 months (around 10/23/2016) for (MD) Med-Mgmt, in addition, procedure (ASAA).  Future Appointments Date Time Provider [place]  07/31/2016 9:45 AM Milinda Pointer, MD [facility] None  09/11/2016 3:15 PM Robert Bellow, MD [facility] None  10/24/2016 1:00 PM Milinda Pointer, MD [facility] None   Primary Care Physician: [facility] Location: [facility] Facility Note by: Ellyana Crigler A. [name], M.D, DABA, DABAPM, DABPM, DABIPP, FIPP Date: 07/25/16; Time: 2:36 PM  Pain Score Disclaimer: We use the NRS-11 scale. This is a self-reported, subjective measurement of pain severity with only modest accuracy. It is used primarily to identify changes within a particular patient. It must be understood that outpatient pain scales are significantly less accurate that those used for research, where they can be applied under ideal controlled circumstances with minimal exposure to variables. In reality, the score is likely to be a combination of pain intensity and pain affect, where pain affect describes the degree of emotional arousal or changes in action readiness caused by the sensory experience of pain. Factors such as social and work situation, setting, emotional state, anxiety levels, expectation, and prior pain experience may influence pain perception and show large inter-individual differences that may also be affected by time variables.  Patient instructions provided during this appointment: Patient Instructions   Facet Blocks Patient Information  Description: The facets are joints in the spine between the vertebrae.  Like any joints in the body, facets can become irritated and painful.  Arthritis can also effect the facets.  By injecting steroids and local anesthetic in and around these joints, we can temporarily block the nerve supply to them.  Steroids act directly on irritated nerves and tissues to reduce selling and inflammation which  often leads to decreased pain.  Facet blocks may be done anywhere along the spine from the neck to the low back depending upon the location of your pain.   After numbing the skin with local anesthetic (like Novocaine), a small needle is passed onto the facet joints under x-ray guidance.  You may experience a sensation of pressure while this is being done.  The entire block usually lasts about 15-25 minutes.   Conditions which may be treated by facet blocks:   Low back/buttock pain  Neck/shoulder pain  Certain types of headaches  Preparation for the injection:  1. Do not eat any solid food or dairy products within 8 hours of your appointment. 2. You may drink clear liquid up to 3 hours before appointment.  Clear liquids include water, black coffee, juice or soda.  No milk or cream please. 3. You may take your regular medication, including pain medications, with a sip of water before your appointment.  Diabetics should hold regular insulin (if taken separately) and take 1/2 normal NPH dose the morning of the procedure.  Carry some sugar containing items with you to your appointment. 4. A driver must accompany you and be prepared to drive you home after your procedure. 5. Bring all your current medications with you. 6. An IV may be inserted and sedation may be given at the discretion of the physician. 7. A blood pressure cuff, EKG and other monitors will often be applied during the procedure.  Some patients may need to have extra oxygen administered for a short period. 8. You will be asked to provide medical information, including your allergies and medications, prior to the procedure.  We must know immediately if you are taking blood thinners (like Coumadin/Warfarin) or if you are allergic to IV iodine contrast (dye).  We must know if you could possible be pregnant.  Possible side-effects:   Bleeding from needle site  Infection (rare, may require surgery)  Nerve injury (rare)  Numbness  & tingling (temporary)  Difficulty urinating (rare, temporary)  Spinal headache (a headache worse with upright posture)  Light-headedness (temporary)  Pain at injection site (serveral days)  Decreased blood pressure (rare, temporary)  Weakness in arm/leg (temporary)  Pressure sensation in back/neck (temporary)   Call if you experience:   Fever/chills associated with headache or increased back/neck pain  Headache worsened by an upright position  New onset, weakness or numbness of an extremity below the injection site  Hives or difficulty breathing (go to the emergency room)  Inflammation or drainage at the injection site(s)  Severe back/neck pain greater than usual  New symptoms which are concerning to you  Please note:  Although the local anesthetic injected can often make your back or neck feel good for several hours after the injection, the pain will likely return. It takes 3-7 days for steroids to work.  You may not notice any pain relief for at least one week.  If effective, we will often do a series of 2-3 injections spaced 3-6 weeks apart to maximally decrease your pain.  After the initial series, you may be a candidate for a more permanent nerve block of the facets.  If you have any questions, please call #[phone] [place]  What are the risk, side effects and possible complications? Generally speaking, most procedures are safe.  However, with any procedure there are risks, side effects, and the possibility of complications.  The risks and complications are dependent upon the sites that are lesioned, or the type of nerve block to be performed.  The closer the procedure is to the spine, the more serious the risks are.  Great care is taken when placing the radio frequency needles, block needles or lesioning probes, but sometimes complications can occur. 1. Infection: Any time there is an  injection through the skin, there is a risk of infection.  This is why sterile conditions are used for these blocks.  There are four possible types of infection. 1. Localized skin infection. 2. Central Nervous System Infection-This can be in the form of Meningitis, which can be deadly. 3. Epidural Infections-This can be in the form of an epidural abscess, which can cause pressure inside of the spine, causing compression of the  spinal cord with subsequent paralysis. This would require an emergency surgery to decompress, and there are no guarantees that the patient would recover from the paralysis. 4. Discitis-This is an infection of the intervertebral discs.  It occurs in about 1% of discography procedures.  It is difficult to treat and it may lead to surgery.        2. Pain: the needles have to go through skin and soft tissues, will cause soreness.       3. Damage to internal structures:  The nerves to be lesioned may be near blood vessels or    other nerves which can be potentially damaged.       4. Bleeding: Bleeding is more common if the patient is taking blood thinners such as  aspirin, Coumadin, Ticiid, Plavix, etc., or if he/she have some genetic predisposition  such as hemophilia. Bleeding into the spinal canal can cause compression of the spinal  cord with subsequent paralysis.  This would require an emergency surgery to  decompress and there are no guarantees that the patient would recover from the  paralysis.       5. Pneumothorax:  Puncturing of a lung is a possibility, every time a needle is introduced in  the area of the chest or upper back.  Pneumothorax refers to free air around the  collapsed lung(s), inside of the thoracic cavity (chest cavity).  Another two possible  complications related to a similar event would include: Hemothorax and Chylothorax.   These are variations of the Pneumothorax, where instead of air around the collapsed  lung(s), you may have blood or chyle, respectively.        6. Spinal headaches: They may occur with any procedures in the area of the spine.       7. Persistent CSF (Cerebro-Spinal Fluid) leakage: This is a rare problem, but may occur  with prolonged intrathecal or epidural catheters either due to the formation of a fistulous  track or a dural tear.       8. Nerve damage: By working so close to the spinal cord, there is always a possibility of  nerve damage, which could be as serious as a permanent spinal cord injury with  paralysis.       9. Death:  Although rare, severe deadly allergic reactions known as "Anaphylactic  reaction" can occur to any of the medications used.      10. Worsening of the symptoms:  We can always make thing worse.  What are the chances of something like this happening? Chances of any of this occuring are extremely low.  By statistics, you have more of a chance of getting killed in a motor vehicle accident: while driving to the hospital than any of the above occurring .  Nevertheless, you should be aware that they are possibilities.  In general, it is similar to taking a shower.  Everybody knows that you can slip, hit your head and get killed.  Does that mean that you should not shower again?  Nevertheless always keep in mind that statistics do not mean anything if you happen to be on the wrong side of them.  Even if a procedure has a 1 (one) in a 1,000,000 (million) chance of going wrong, it you happen to be that one..Also, keep in mind that by statistics, you have more of a chance of having something go wrong when taking medications.  Who should not have this procedure? If you are on a blood thinning medication (  e.g. Coumadin, Plavix, see list of "Blood Thinners"), or if you have an active infection going on, you should not have the procedure.  If you are taking any blood thinners, please inform your physician.  How should I prepare for this procedure?  Do not eat or drink anything at least six hours prior to the  procedure.  Bring a driver with you .  It cannot be a taxi.  Come accompanied by an adult that can drive you back, and that is strong enough to help you if your legs get weak or numb from the local anesthetic.  Take all of your medicines the morning of the procedure with just enough water to swallow them.  If you have diabetes, make sure that you are scheduled to have your procedure done first thing in the morning, whenever possible.  If you have diabetes, take only half of your insulin dose and notify our nurse that you have done so as soon as you arrive at the clinic.  If you are diabetic, but only take blood sugar pills (oral hypoglycemic), then do not take them on the morning of your procedure.  You may take them after you have had the procedure.  Do not take aspirin or any aspirin-containing medications, at least eleven (11) days prior to the procedure.  They may prolong bleeding.  Wear loose fitting clothing that may be easy to take off and that you would not mind if it got stained with Betadine or blood.  Do not wear any jewelry or perfume  Remove any nail coloring.  It will interfere with some of our monitoring equipment.  NOTE: Remember that this is not meant to be interpreted as a complete list of all possible complications.  Unforeseen problems may occur.  BLOOD THINNERS The following drugs contain aspirin or other products, which can cause increased bleeding during surgery and should not be taken for 2 weeks prior to and 1 week after surgery.  If you should need take something for relief of minor pain, you may take acetaminophen which is found in Tylenol,m Datril, Anacin-3 and Panadol. It is not blood thinner. The products listed below are.  Do not take any of the products listed below in addition to any listed on your instruction sheet.  A.P.C or A.P.C with Codeine Codeine Phosphate Capsules #3 Ibuprofen Ridaura  ABC compound Congesprin Imuran rimadil  Advil Cope Indocin  Robaxisal  Alka-Seltzer Effervescent Pain Reliever and Antacid Coricidin or Coricidin-D  Indomethacin Rufen  Alka-Seltzer plus Cold Medicine Cosprin Ketoprofen S-A-C Tablets  Anacin Analgesic Tablets or Capsules Coumadin Korlgesic Salflex  Anacin Extra Strength Analgesic tablets or capsules CP-2 Tablets Lanoril Salicylate  Anaprox Cuprimine Capsules Levenox Salocol  Anexsia-D Dalteparin Magan Salsalate  Anodynos Darvon compound Magnesium Salicylate Sine-off  Ansaid Dasin Capsules Magsal Sodium Salicylate  Anturane Depen Capsules Marnal Soma  APF Arthritis pain formula Dewitt's Pills Measurin Stanback  Argesic Dia-Gesic Meclofenamic Sulfinpyrazone  Arthritis Bayer Timed Release Aspirin Diclofenac Meclomen Sulindac  Arthritis pain formula Anacin Dicumarol Medipren Supac  Analgesic (Safety coated) Arthralgen Diffunasal Mefanamic Suprofen  Arthritis Strength Bufferin Dihydrocodeine Mepro Compound Suprol  Arthropan liquid Dopirydamole Methcarbomol with Aspirin Synalgos  ASA tablets/Enseals Disalcid Micrainin Tagament  Ascriptin Doan's Midol Talwin  Ascriptin A/D Dolene Mobidin Tanderil  Ascriptin Extra Strength Dolobid Moblgesic Ticlid  Ascriptin with Codeine Doloprin or Doloprin with Codeine Momentum Tolectin  Asperbuf Duoprin Mono-gesic Trendar  Aspergum Duradyne Motrin or Motrin IB Triminicin  Aspirin plain, buffered or enteric coated Durasal Myochrisine  Trigesic  Aspirin Suppositories Easprin Nalfon Trillsate  Aspirin with Codeine Ecotrin Regular or Extra Strength Naprosyn Uracel  Atromid-S Efficin Naproxen Ursinus  Auranofin Capsules Elmiron Neocylate Vanquish  Axotal Emagrin Norgesic Verin  Azathioprine Empirin or Empirin with Codeine Normiflo Vitamin E  Azolid Emprazil Nuprin Voltaren  Bayer Aspirin plain, buffered or children's or timed BC Tablets or powders Encaprin Orgaran Warfarin Sodium  Buff-a-Comp Enoxaparin Orudis Zorpin  Buff-a-Comp with Codeine Equegesic Os-Cal-Gesic    Buffaprin Excedrin plain, buffered or Extra Strength Oxalid   Bufferin Arthritis Strength Feldene Oxphenbutazone   Bufferin plain or Extra Strength Feldene Capsules Oxycodone with Aspirin   Bufferin with Codeine Fenoprofen Fenoprofen Pabalate or Pabalate-SF   Buffets II Flogesic Panagesic   Buffinol plain or Extra Strength Florinal or Florinal with Codeine Panwarfarin   Buf-Tabs Flurbiprofen Penicillamine   Butalbital Compound Four-way cold tablets Penicillin   Butazolidin Fragmin Pepto-Bismol   Carbenicillin Geminisyn Percodan   Carna Arthritis Reliever Geopen Persantine   Carprofen Gold's salt Persistin   Chloramphenicol Goody's Phenylbutazone   Chloromycetin Haltrain Piroxlcam   Clmetidine heparin Plaquenil   Cllnoril Hyco-pap Ponstel   Clofibrate Hydroxy chloroquine Propoxyphen         Before stopping any of these medications, be sure to consult the physician who ordered them.  Some, such as Coumadin (Warfarin) are ordered to prevent or treat serious conditions such as "deep thrombosis", "pumonary embolisms", and other heart problems.  The amount of time that you may need off of the medication may also vary with the medication and the reason for which you were taking it.  If you are taking any of these medications, please make sure you notify your pain physician before you undergo any procedures.

## 2016-07-27 ENCOUNTER — Ambulatory Visit
Admission: RE | Admit: 2016-07-27 | Discharge: 2016-07-27 | Disposition: A | Discharge status: Home / Self Care | Payer: Medicaid Other | Source: Ambulatory Visit | Attending: Pain Medicine | Admitting: Pain Medicine

## 2016-07-27 DIAGNOSIS — M47816 Spondylosis without myelopathy or radiculopathy, lumbar region: Secondary | ICD-10-CM | POA: Diagnosis not present

## 2016-07-27 DIAGNOSIS — M545 Low back pain: Secondary | ICD-10-CM | POA: Diagnosis present

## 2016-07-27 DIAGNOSIS — G8929 Other chronic pain: Secondary | ICD-10-CM

## 2016-07-27 DIAGNOSIS — M4310 Spondylolisthesis, site unspecified: Secondary | ICD-10-CM

## 2016-07-27 DIAGNOSIS — M5441 Lumbago with sciatica, right side: Principal | ICD-10-CM

## 2016-07-31 ENCOUNTER — Ambulatory Visit: Payer: Medicaid Other | Admitting: Pain Medicine

## 2016-08-05 ENCOUNTER — Encounter: Payer: Self-pay | Admitting: Pain Medicine

## 2016-08-05 ENCOUNTER — Ambulatory Visit
Admission: RE | Admit: 2016-08-05 | Discharge: 2016-08-05 | Disposition: A | Discharge status: Home / Self Care | Payer: Medicaid Other | Source: Ambulatory Visit | Attending: Pain Medicine | Admitting: Pain Medicine

## 2016-08-05 ENCOUNTER — Ambulatory Visit (HOSPITAL_BASED_OUTPATIENT_CLINIC_OR_DEPARTMENT_OTHER): Payer: Medicaid Other | Admitting: Pain Medicine

## 2016-08-05 VITALS — BP 123/80 | HR 79 | Temp 97.0°F | Resp 15 | Ht 69.0 in | Wt 245.0 lb

## 2016-08-05 DIAGNOSIS — M1288 Other specific arthropathies, not elsewhere classified, other specified site: Secondary | ICD-10-CM | POA: Diagnosis not present

## 2016-08-05 DIAGNOSIS — G8929 Other chronic pain: Secondary | ICD-10-CM

## 2016-08-05 DIAGNOSIS — M5441 Lumbago with sciatica, right side: Secondary | ICD-10-CM

## 2016-08-05 DIAGNOSIS — M47816 Spondylosis without myelopathy or radiculopathy, lumbar region: Secondary | ICD-10-CM

## 2016-08-05 MED ORDER — LACTATED RINGERS IV SOLN: 1000.0000 mL | Freq: Once | INTRAVENOUS | Status: DC

## 2016-08-05 MED ORDER — MIDAZOLAM HCL 5 MG/5ML IJ SOLN
INTRAMUSCULAR | Status: AC
Start: 2016-08-05 — End: 2016-08-05
  Administered 2016-08-05: 3 mg via INTRAVENOUS
  Filled 2016-08-05: qty 5

## 2016-08-05 MED ORDER — MIDAZOLAM HCL 5 MG/5ML IJ SOLN: 1.0000 mg | INTRAMUSCULAR | Status: DC | PRN

## 2016-08-05 MED ORDER — FENTANYL CITRATE (PF) 100 MCG/2ML IJ SOLN: 25.0000 ug | INTRAMUSCULAR | Status: DC | PRN

## 2016-08-05 MED ORDER — TRIAMCINOLONE ACETONIDE 40 MG/ML IJ SUSP: 40.0000 mg | Freq: Once | INTRAMUSCULAR | Status: DC

## 2016-08-05 MED ORDER — LIDOCAINE HCL (PF) 1 % IJ SOLN: 10.0000 mL | Freq: Once | INTRAMUSCULAR | Status: DC

## 2016-08-05 MED ORDER — ROPIVACAINE HCL 2 MG/ML IJ SOLN
INTRAMUSCULAR | Status: AC
  Administered 2016-08-05: 9 mL via PERINEURAL
  Filled 2016-08-05: qty 20

## 2016-08-05 MED ORDER — ROPIVACAINE HCL 2 MG/ML IJ SOLN: 9.0000 mL | Freq: Once | INTRAMUSCULAR | Status: DC

## 2016-08-05 MED ORDER — TRIAMCINOLONE ACETONIDE 40 MG/ML IJ SUSP
INTRAMUSCULAR | Status: AC
  Administered 2016-08-05: 40 mg
  Filled 2016-08-05: qty 2

## 2016-08-05 MED ORDER — ROPIVACAINE HCL 2 MG/ML IJ SOLN
9.0000 mL | Freq: Once | INTRAMUSCULAR | Status: AC
  Administered 2016-08-05: 9 mL via PERINEURAL

## 2016-08-05 MED ORDER — TRIAMCINOLONE ACETONIDE 40 MG/ML IJ SUSP
40.0000 mg | Freq: Once | INTRAMUSCULAR | Status: AC
  Administered 2016-08-05: 40 mg

## 2016-08-05 MED ORDER — FENTANYL CITRATE (PF) 100 MCG/2ML IJ SOLN
INTRAMUSCULAR | Status: AC
  Administered 2016-08-05: 100 ug via INTRAVENOUS
  Filled 2016-08-05: qty 2

## 2016-08-05 NOTE — Patient Instructions (Addendum)
Steps to Quit Smoking Smoking tobacco can be bad for your health. It can also affect almost every organ in your body. Smoking puts you and people around you at risk for many serious long-lasting (chronic) diseases. Quitting smoking is hard, but it is one of the best things that you can do for your health. It is never too late to quit. What are the benefits of quitting smoking? When you quit smoking, you lower your risk for getting serious diseases and conditions. They can include:  Lung cancer or lung disease.  Heart disease.  Stroke.  Heart attack.  Not being able to have children (infertility).  Weak bones (osteoporosis) and broken bones (fractures). If you have coughing, wheezing, and shortness of breath, those symptoms may get better when you quit. You may also get sick less often. If you are pregnant, quitting smoking can help to lower your chances of having a baby of low birth weight. What can I do to help me quit smoking? Talk with your doctor about what can help you quit smoking. Some things you can do (strategies) include:  Quitting smoking totally, instead of slowly cutting back how much you smoke over a period of time.  Going to in-person counseling. You are more likely to quit if you go to many counseling sessions.  Using resources and support systems, such as:  Online chats with a counselor.  Phone quitlines.  Printed self-help materials.  Support groups or group counseling.  Text messaging programs.  Mobile phone apps or applications.  Taking medicines. Some of these medicines may have nicotine in them. If you are pregnant or breastfeeding, do not take any medicines to quit smoking unless your doctor says it is okay. Talk with your doctor about counseling or other things that can help you. Talk with your doctor about using more than one strategy at the same time, such as taking medicines while you are also going to in-person counseling. This can help make quitting  easier. What things can I do to make it easier to quit? Quitting smoking might feel very hard at first, but there is a lot that you can do to make it easier. Take these steps:  Talk to your family and friends. Ask them to support and encourage you.  Call phone quitlines, reach out to support groups, or work with a counselor.  Ask people who smoke to not smoke around you.  Avoid places that make you want (trigger) to smoke, such as:  Bars.  Parties.  Smoke-break areas at work.  Spend time with people who do not smoke.  Lower the stress in your life. Stress can make you want to smoke. Try these things to help your stress:  Getting regular exercise.  Deep-breathing exercises.  Yoga.  Meditating.  Doing a body scan. To do this, close your eyes, focus on one area of your body at a time from head to toe, and notice which parts of your body are tense. Try to relax the muscles in those areas.  Download or buy apps on your mobile phone or tablet that can help you stick to your quit plan. There are many free apps, such as QuitGuide from the CDC (Centers for Disease Control and Prevention). You can find more support from smokefree.gov and other websites. This information is not intended to replace advice given to you by your health care provider. Make sure you discuss any questions you have with your health care provider. Document Released: 04/27/2009 Document Revised: 02/27/2016 Document   Reviewed: 11/15/2014 Elsevier Interactive Patient Education  2017 [place]. Pain Management Discharge Instructions  General Discharge Instructions :  If you need to reach your doctor call: Monday-Friday 8:00 am - 4:00 pm at [phone] or toll free [phone].  After clinic hours [phone] to have operator reach doctor.  Bring all of your medication bottles to all your appointments in the pain clinic.  To cancel or reschedule your appointment with Pain Management please remember to call  24 hours in advance to avoid a fee.  Refer to the educational materials which you have been given on: General Risks, I had my Procedure. Discharge Instructions, Post Sedation.  Post Procedure Instructions:  The drugs you were given will stay in your system until tomorrow, so for the next 24 hours you should not drive, make any legal decisions or drink any alcoholic beverages.  You may eat anything you prefer, but it is better to start with liquids then soups and crackers, and gradually work up to solid foods.  Please notify your doctor immediately if you have any unusual bleeding, trouble breathing or pain that is not related to your normal pain.  Depending on the type of procedure that was done, some parts of your body may feel week and/or numb.  This usually clears up by tonight or the next day.  Walk with the use of an assistive device or accompanied by an adult for the 24 hours.  You may use ice on the affected area for the first 24 hours.  Put ice in a Ziploc bag and cover with a towel and place against area 15 minutes on 15 minutes off.  You may switch to heat after 24 hours.Facet Joint Block The facet joints connect the bones of the spine (vertebrae). They make it possible for you to bend, twist, and make other movements with your spine. They also prevent you from overbending, overtwisting, and making other excessive movements.  A facet joint block is a procedure where a numbing medicine (anesthetic) is injected into a facet joint. Often, a type of anti-inflammatory medicine called a steroid is also injected. A facet joint block may be done for two reasons:  Diagnosis. A facet joint block may be done as a test to see whether neck or back pain is caused by a worn-down or infected facet joint. If the pain gets better after a facet joint block, it means the pain is probably coming from the facet joint. If the pain does not get better, it means the pain is probably not coming from the facet  joint.  Therapy. A facet joint block may be done to relieve neck or back pain caused by a facet joint. A facet joint block is only done as a therapy if the pain does not improve with medicine, exercise programs, physical therapy, and other forms of pain management. LET [facility] CARE PROVIDER KNOW ABOUT:  Any allergies you have.  All medicines you are taking, including vitamins, herbs, eyedrops, and over-the-counter medicines and creams.  Previous problems you or members of your family have had with the use of anesthetics.  Any blood disorders you have had.  Other health problems you have. RISKS AND COMPLICATIONS Generally, having a facet joint block is safe. However, as with any procedure, complications can occur. Possible complications associated with having a facet joint block include:  Bleeding.  Injury to a nerve near the injection site.  Pain at the injection site.  Weakness or numbness in areas controlled by nerves near  the injection site.  Infection.  Temporary fluid retention.  Allergic reaction to anesthetics or medicines used during the procedure. BEFORE THE PROCEDURE  Follow your health care provider's instructions if you are taking dietary supplements or medicines. You may need to stop taking them or reduce your dosage.  Do not take any new dietary supplements or medicines without asking your health care provider first.  Follow your health care provider's instructions about eating and drinking before the procedure. You may need to stop eating and drinking several hours before the procedure.  Arrange to have an adult drive you home after the procedure. PROCEDURE You may need to remove your clothing and dress in an open-back gown so that your health care provider can access your spine.  The procedure will be done while you are lying on an X-ray table. Most of the time you will be asked to lie on your stomach, but you may be asked to lie in a different position if  an injection will be made in your neck.  Special machines will be used to monitor your oxygen levels, heart rate, and blood pressure.  If an injection will be made in your neck, an intravenous (IV) tube will be inserted into one of your veins. Fluids and medicine will flow directly into your body through the IV tube.  The area over the facet joint where the injection will be made will be cleaned with an antiseptic soap. The surrounding skin will be covered with sterile drapes.  An anesthetic will be applied to your skin to make the injection area numb. You may feel a temporary stinging or burning sensation.  A video X-ray machine will be used to locate the joint. A contrast dye may be injected into the facet joint area to help with locating the joint.  When the joint is located, an anesthetic medicine will be injected into the joint through the needle.  Your health care provider will ask you whether you feel pain relief. If you do feel relief, a steroid may be injected to provide pain relief for a longer period of time. If you do not feel relief or feel only partial relief, additional injections of an anesthetic may be made in other facet joints.  The needle will be removed, the skin will be cleansed, and bandages will be applied.  AFTER THE PROCEDURE  You will be observed for 15-30 minutes before being allowed to go home. Do not drive. Have an adult drive you or take a taxi or public transportation instead.  If you feel pain relief, the pain will return in several hours or days when the anesthetic wears off.  You may feel pain relief 2-14 days after the procedure. The amount of time this relief lasts varies from person to person.  It is normal to feel some tenderness over the injected area(s) for 2 days following the procedure.  If you have diabetes, you may have a temporary increase in blood sugar. This information is not intended to replace advice given to you by your health care  provider. Make sure you discuss any questions you have with your health care provider. Document Released: 11/20/2006 Document Revised: 07/22/2014 Document Reviewed: 03/27/2015 Elsevier Interactive Patient Education  2017 [place] After Refer to this sheet in the next few weeks. These instructions provide you with information on caring for yourself after your procedure. Your health care provider may also give you more specific instructions. Your treatment has been planned according  to current medical practices, but problems sometimes occur. Call your health care provider if you have any problems or questions after your procedure. HOME CARE INSTRUCTIONS  Keep track of the amount of pain relief you feel and how long it lasts. Limit pain medicine within the first 4-6 hours after the procedure as directed by your health care provider. Resume taking dietary supplements and medicines as directed by your health care provider. You may resume your regular diet. Do not apply heat near or over the injection site(s) for 24 hours.  Do not take a bath or soak in water (such as a pool or lake) for 24 hours. Do not drive for 24 hours unless approved by your health care provider. Avoid strenuous activity for 24 hours. Remove your bandages the morning after the procedure.  If the injection site is tender, applying an ice pack may relieve some tenderness. To do this: Put ice in a bag. Place a towel between your skin and the bag. Leave the ice on for 15-20 minutes, 3-4 times a day. Keep follow-up appointments as directed by your health care provider. SEEK MEDICAL CARE IF:  Your pain is not controlled by your medicines.  There is drainage from the injection site.  There is significant bleeding or swelling at the injection site. You have diabetes and your blood sugar is above 180 mg/dL. SEEK IMMEDIATE MEDICAL CARE IF:  You develop a fever of 101F (38.3C) or greater.  You  have worsening pain or swelling around the injection site.  You have red streaking around the injection site.  You develop severe pain that is not controlled by your medicines.  You develop a headache, stiff neck, nausea, or vomiting.  Your eyes become very sensitive to light.  You have weakness, paralysis, or tingling in your arms or legs that was not present before the procedure.  You develop difficulty urinating or breathing.  This information is not intended to replace advice given to you by your health care provider. Make sure you discuss any questions you have with your health care provider. Document Released: 06/17/2012 Document Revised: 07/22/2014 Document Reviewed: 03/27/2015 Elsevier Interactive Patient Education  2017 [employer].

## 2016-08-05 NOTE — Progress Notes (Signed)
Patient's Name: Danielle [name]  MRN: [id number]  Referring Provider: Milinda Pointer, MD  DOB: [birth year]  PCP: [facility]  DOS: 08/05/2016  Note by: Kathlen Brunswick. [name], MD  Service setting: Ambulatory outpatient  Location: [facility] (AMB) Pain Management Facility  Visit type: Procedure  Specialty: Interventional Pain Management  Patient type: Established   Primary Reason for Visit: Interventional Pain Management Treatment. CC: Back Pain (low); Shoulder Pain (left); and Hip Pain (right)  Procedure:  Anesthesia, Analgesia, Anxiolysis:  Type: Diagnostic Medial Branch Facet Block Region: Lumbar Level: L2, L3, L4, L5, & S1 Medial Branch Level(s) Laterality: Bilateral  Type: Local Anesthesia with Moderate (Conscious) Sedation Local Anesthetic: Lidocaine 1% Route: Intravenous (IV) IV Access: Secured Sedation: Meaningful verbal contact was maintained at all times during the procedure  Indication(s): Analgesia and Anxiety  Indications: 1. Lumbar facet syndrome (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)   2. Chronic low back pain (Location of Tertiary source of pain) (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)    Pain Score: Pre-procedure: 4 /10 Post-procedure: 0-No pain/10  Pre-op Assessment:  Previous date of service: 07/25/16 Service provided: Med Refill, Evaluation Danielle [name] is a 56 y.o. (year old), female patient, seen today for interventional treatment. She  has a past surgical history that includes necksurgery; Back surgery; Foot surgery (Right); Elbow surgery (Left); Carpal tunnel release (Right); Nose surgery; Partial hysterectomy; and Total shoulder replacement (Bilateral). Her primarily concern today is the Back Pain (low); Shoulder Pain (left); and Hip Pain (right)  Initial Vital Signs: Blood pressure 116/68, resp. rate 20, height 5\' 9"  (1.753 m), weight 245 lb (111.1 kg), SpO2 95 %. BMI: 36.18 kg/m  Risk Assessment: Allergies: Reviewed. She is allergic to aripiprazole; duloxetine; gabapentin; gold; iodinated  diagnostic agents; naproxen sodium; nortriptyline hcl; nsaids; pregabalin; ace inhibitors; aspirin; cefpodoxime; fluoxetine; fluticasone-salmeterol; levofloxacin; lithium; meperidine; paroxetine; tape; telithromycin; theophylline; topiramate; trazodone; triamcinolone; venlafaxine; bupropion; ketorolac tromethamine; morphine; and moxifloxacin. Allergy Precautions: None required Coagulopathies: "Reviewed. None identified.  Blood-thinner therapy: None at this time Active Infection(s): Reviewed. None identified. Danielle [name] is afebrile  Site Confirmation: Danielle [name] was asked to confirm the procedure and laterality before marking the site Procedure checklist: Completed Consent: Before the procedure and under the influence of no sedative(s), amnesic(s), or anxiolytics, the patient was informed of the treatment options, risks and possible complications. To fulfill our ethical and legal obligations, as recommended by the American Medical Association's Code of Ethics, I have informed the patient of my clinical impression; the nature and purpose of the treatment or procedure; the risks, benefits, and possible complications of the intervention; the alternatives, including doing nothing; the risk(s) and benefit(s) of the alternative treatment(s) or procedure(s); and the risk(s) and benefit(s) of doing nothing. The patient was provided information about the general risks and possible complications associated with the procedure. These may include, but are not limited to: failure to achieve desired goals, infection, bleeding, organ or nerve damage, allergic reactions, paralysis, and death. In addition, the patient was informed of those risks and complications associated to Spine-related procedures, such as failure to decrease pain; infection (i.e.: Meningitis, epidural or intraspinal abscess); bleeding (i.e.: epidural hematoma, subarachnoid hemorrhage, or any other type of intraspinal or peri-dural bleeding); organ or  nerve damage (i.e.: Any type of peripheral nerve, nerve root, or spinal cord injury) with subsequent damage to sensory, motor, and/or autonomic systems, resulting in permanent pain, numbness, and/or weakness of one or several areas of the body; allergic reactions; (i.e.: anaphylactic reaction); and/or death. Furthermore, the patient was informed of those risks and complications  associated with the medications. These include, but are not limited to: allergic reactions (i.e.: anaphylactic or anaphylactoid reaction(s)); adrenal axis suppression; blood sugar elevation that in diabetics may result in ketoacidosis or comma; water retention that in patients with history of congestive heart failure may result in shortness of breath, pulmonary edema, and decompensation with resultant heart failure; weight gain; swelling or edema; medication-induced neural toxicity; particulate matter embolism and blood vessel occlusion with resultant organ, and/or nervous system infarction; and/or aseptic necrosis of one or more joints. Finally, the patient was informed that Medicine is not an exact science; therefore, there is also the possibility of unforeseen or unpredictable risks and/or possible complications that may result in a catastrophic outcome. The patient indicated having understood very clearly. We have given the patient no guarantees and we have made no promises. Enough time was given to the patient to ask questions, all of which were answered to the patient's satisfaction. Danielle [name] has indicated that she wanted to continue with the procedure. Attestation: I, the ordering provider, attest that I have discussed with the patient the benefits, risks, side-effects, alternatives, likelihood of achieving goals, and potential problems during recovery for the procedure that I have provided informed consent. Date: 08/05/2016; Time: 11:16 AM  Pre-Procedure Preparation:  Monitoring: As per clinic protocol. Respiration, ETCO2,  SpO2, BP, heart rate and rhythm monitor placed and checked for adequate function Safety Precautions: Patient was assessed for positional comfort and pressure points before starting the procedure. Time-out: I initiated and conducted the "Time-out" before starting the procedure, as per protocol. The patient was asked to participate by confirming the accuracy of the "Time Out" information. Verification of the correct person, site, and procedure were performed and confirmed by me, the nursing staff, and the patient. "Time-out" conducted as per Joint Commission's Universal Protocol (UP.01.01.01). "Time-out" Date & Time: 08/05/2016; 1119 hrs.  Description of Procedure Process:   Position: Prone Target Area: For Lumbar Facet blocks, the target is the groove formed by the junction of the transverse process and superior articular process. For the L5 dorsal ramus, the target is the notch between superior articular process and sacral ala. For the S1 dorsal ramus, the target is the superior and lateral edge of the posterior S1 Sacral foramen. Approach: Paramedial approach. Area Prepped: Entire Posterior Lumbosacral Region Prepping solution: ChloraPrep (2% chlorhexidine gluconate and 70% isopropyl alcohol) Safety Precautions: Aspiration looking for blood return was conducted prior to all injections. At no point did we inject any substances, as a needle was being advanced. No attempts were made at seeking any paresthesias. Safe injection practices and needle disposal techniques used. Medications properly checked for expiration dates. SDV (single dose vial) medications used. Description of the Procedure: Protocol guidelines were followed. The patient was placed in position over the fluoroscopy table. The target area was identified and the area prepped in the usual manner. Skin desensitized using vapocoolant spray. Skin & deeper tissues infiltrated with local anesthetic. Appropriate amount of time allowed to pass for  local anesthetics to take effect. The procedure needle was introduced through the skin, ipsilateral to the reported pain, and advanced to the target area. Employing the "Medial Branch Technique", the needles were advanced to the angle made by the superior and medial portion of the transverse process, and the lateral and inferior portion of the superior articulating process of the targeted vertebral bodies. This area is known as "Burton's Eye" or the "Eye of the [place] Dog". A procedure needle was introduced through the skin, and this  time advanced to the angle made by the superior and medial border of the sacral ala, and the lateral border of the S1 vertebral body. This last needle was later repositioned at the superior and lateral border of the posterior S1 foramen. Negative aspiration confirmed. Solution injected in intermittent fashion, asking for systemic symptoms every 0.5cc of injectate. The needles were then removed and the area cleansed, making sure to leave some of the prepping solution back to take advantage of its long term bactericidal properties. Start Time: 1119 hrs. End Time: 1132 hrs.  Illustration of the posterior view of the lumbar spine and the posterior neural structures. Laminae of L2 through S1 are labeled. DPRL5, dorsal primary ramus of L5; DPRS1, dorsal primary ramus of S1; DPR3, dorsal primary ramus of L3; FJ, facet (zygapophyseal) joint L3-L4; I, inferior articular process of L4; LB1, lateral branch of dorsal primary ramus of L1; IAB, inferior articular branches from L3 medial branch (supplies L4-L5 facet joint); IBP, intermediate branch plexus; MB3, medial branch of dorsal primary ramus of L3; NR3, third lumbar nerve root; S, superior articular process of L5; SAB, superior articular branches from L4 (supplies L4-5 facet joint also); TP3, transverse process of L3.  Materials:  Needle(s) Type: Epidural needle Gauge: 22G Length: 3.5-in Medication(s): We administered ropivacaine  (PF) 2 mg/mL (0.2%), fentaNYL, triamcinolone acetonide, midazolam, ropivacaine (PF) 2 mg/mL (0.2%), and triamcinolone acetonide. Please see chart orders for dosing details.  Imaging Guidance (Spinal):  Type of Imaging Technique: Fluoroscopy Guidance (Spinal) Indication(s): Assistance in needle guidance and placement for procedures requiring needle placement in or near specific anatomical locations not easily accessible without such assistance. Exposure Time: Please see nurses notes. Contrast: None used. Fluoroscopic Guidance: I was personally present during the use of fluoroscopy. "Tunnel Vision Technique" used to obtain the best possible view of the target area. Parallax error corrected before commencing the procedure. "Direction-depth-direction" technique used to introduce the needle under continuous pulsed fluoroscopy. Once target was reached, antero-posterior, oblique, and lateral fluoroscopic projection used confirm needle placement in all planes. Images permanently stored in EMR. Interpretation: No contrast injected. I personally interpreted the imaging intraoperatively. Adequate needle placement confirmed in multiple planes. Permanent images saved into the patient's record.  Antibiotic Prophylaxis:  Indication(s): None identified Antibiotic given: None  Post-operative Assessment:  EBL: None Complications: No immediate post-treatment complications observed by team, or reported by patient. Note: The patient tolerated the entire procedure well. A repeat set of vitals were taken after the procedure and the patient was kept under observation following institutional policy, for this type of procedure. Post-procedural neurological assessment was performed, showing return to baseline, prior to discharge. The patient was provided with post-procedure discharge instructions, including a section on how to identify potential problems. Should any problems arise concerning this procedure, the patient was  given instructions to immediately contact us, at any time, without hesitation. In any case, we plan to contact the patient by telephone for a follow-up status report regarding this interventional procedure. Comments:  No additional relevant information.  Plan of Care  Disposition: Discharge home  Discharge Date & Time: 08/05/2016; 1204 hrs.  Physician-requested Follow-up:  Return in about 2 weeks (around 08/19/2016) for Post-Procedure evaluation.  Future Appointments Date Time Provider [place]  09/05/2016 2:00 PM Milinda Pointer, MD [facility] None  09/11/2016 3:15 PM Robert Bellow, MD [facility] None  10/24/2016 1:00 PM Milinda Pointer, MD [facility] None   Medications ordered for procedure: Meds ordered this encounter  Medications  . ropivacaine (PF) 2  mg/mL (0.2%) (NAROPIN) 2 MG/ML injection    Willeen Cass L: cabinet override  . fentaNYL (SUBLIMAZE) 100 MCG/2ML injection    Willeen Cass L: cabinet override  . triamcinolone acetonide (KENALOG-40) 40 MG/ML injection    Willeen Cass L: cabinet override  . midazolam (VERSED) 5 MG/5ML injection    Willeen Cass L: cabinet override  . fentaNYL (SUBLIMAZE) injection 25-50 mcg    Make sure Narcan is available in the pyxis when using this medication. In the event of respiratory depression (RR< 8/min): Titrate NARCAN (naloxone) in increments of 0.1 to 0.2 mg IV at 2-3 minute intervals, until desired degree of reversal.  . lactated ringers infusion 1,000 mL  . midazolam (VERSED) 5 MG/5ML injection 1-2 mg    Make sure Flumazenil is available in the pyxis when using this medication. If oversedation occurs, administer 0.2 mg IV over 15 sec. If after 45 sec no response, administer 0.2 mg again over 1 min; may repeat at 1 min intervals; not to exceed 4 doses (1 mg)  . triamcinolone acetonide (KENALOG-40) injection 40 mg  . lidocaine (PF) (XYLOCAINE) 1 % injection 10 mL  . ropivacaine (PF) 2 mg/mL (0.2%) (NAROPIN) injection 9  mL  . ropivacaine (PF) 2 mg/mL (0.2%) (NAROPIN) injection 9 mL  . triamcinolone acetonide (KENALOG-40) injection 40 mg   Medications administered: We administered ropivacaine (PF) 2 mg/mL (0.2%), fentaNYL, triamcinolone acetonide, midazolam, ropivacaine (PF) 2 mg/mL (0.2%), and triamcinolone acetonide.  See the medical record for exact dosing, route, and time of administration.  Lab-work, Procedure(s), & Referral(s) Ordered: Orders Placed This Encounter  Procedures  . DG C-Arm 1-60 Min-No Report  . Discharge instructions  . Follow-up  . Informed Consent Details: Transcribe to consent form and obtain patient signature  . Provider attestation of informed consent for procedure/surgical case  . Verify informed consent   Imaging Ordered: Results for orders placed in visit on 04/30/16  DG C-Arm 1-60 Min-No Report   Narrative CLINICAL DATA: Assistance in needle guidance and placement for procedures  requiring needle placement in or near specific anatomical locations not  easily accessible without such assistance.   C-ARM 1-60 MINUTES  Fluoroscopy was utilized by the requesting physician.  No radiographic  interpretation.     New Prescriptions   No medications on file   Primary Care Physician: [place] [facility] Location: [facility] Note by: Yentl Verge A. [name], M.D, DABA, DABAPM, DABPM, DABIPP, FIPP Date: 08/05/2016; Time: 12:19 PM  Disclaimer:  Medicine is not an Chief Strategy Officer. The only guarantee in medicine is that nothing is guaranteed. It is important to note that the decision to proceed with this intervention was based on the information collected from the patient. The Data and conclusions were drawn from the patient's questionnaire, the interview, and the physical examination. Because the information was provided in large part by the patient, it cannot be guaranteed that it has not been purposely or unconsciously manipulated. Every effort has been  made to obtain as much relevant data as possible for this evaluation. It is important to note that the conclusions that lead to this procedure are derived in large part from the available data. Always take into account that the treatment will also be dependent on availability of resources and existing treatment guidelines, considered by other Pain Management Practitioners as being common knowledge and practice, at the time of the intervention. For Medico-Legal purposes, it is also important to point out that variation in procedural techniques and pharmacological choices are  the acceptable norm. The indications, contraindications, technique, and results of the above procedure should only be interpreted and judged by a Board-Certified Interventional Pain Specialist with extensive familiarity and expertise in the same exact procedure and technique. Attempts at providing opinions without similar or greater experience and expertise than that of the treating physician will be considered as inappropriate and unethical, and shall result in a formal complaint to the state medical board and applicable specialty societies.  Instructions provided at this appointment: Patient Instructions  Steps to Quit Smoking Smoking tobacco can be bad for your health. It can also affect almost every organ in your body. Smoking puts you and people around you at risk for many serious long-lasting (chronic) diseases. Quitting smoking is hard, but it is one of the best things that you can do for your health. It is never too late to quit. What are the benefits of quitting smoking? When you quit smoking, you lower your risk for getting serious diseases and conditions. They can include:  Lung cancer or lung disease.  Heart disease.  Stroke.  Heart attack.  Not being able to have children (infertility).  Weak bones (osteoporosis) and broken bones (fractures). If you have coughing, wheezing, and shortness of breath, those symptoms  may get better when you quit. You may also get sick less often. If you are pregnant, quitting smoking can help to lower your chances of having a baby of low birth weight. What can I do to help me quit smoking? Talk with your doctor about what can help you quit smoking. Some things you can do (strategies) include:  Quitting smoking totally, instead of slowly cutting back how much you smoke over a period of time.  Going to in-person counseling. You are more likely to quit if you go to many counseling sessions.  Using resources and support systems, such as:  Online chats with a Social worker.  Phone quitlines.  Printed Furniture conservator/restorer.  Support groups or group counseling.  Text messaging programs.  Mobile phone apps or applications.  Taking medicines. Some of these medicines may have nicotine in them. If you are pregnant or breastfeeding, do not take any medicines to quit smoking unless your doctor says it is okay. Talk with your doctor about counseling or other things that can help you. Talk with your doctor about using more than one strategy at the same time, such as taking medicines while you are also going to in-person counseling. This can help make quitting easier. What things can I do to make it easier to quit? Quitting smoking might feel very hard at first, but there is a lot that you can do to make it easier. Take these steps:  Talk to your family and friends. Ask them to support and encourage you.  Call phone quitlines, reach out to support groups, or work with a Social worker.  Ask people who smoke to not smoke around you.  Avoid places that make you want (trigger) to smoke, such as:  Bars.  Parties.  Smoke-break areas at work.  Spend time with people who do not smoke.  Lower the stress in your life. Stress can make you want to smoke. Try these things to help your stress:  Getting regular exercise.  Deep-breathing exercises.  Yoga.  Meditating.  Doing a body  scan. To do this, close your eyes, focus on one area of your body at a time from head to toe, and notice which parts of your body are tense. Try to relax the  muscles in those areas.  Download or buy apps on your mobile phone or tablet that can help you stick to your quit plan. There are many free apps, such as QuitGuide from the [employer] Office manager for Disease Control and Prevention). You can find more support from smokefree.gov and other websites. This information is not intended to replace advice given to you by your health care provider. Make sure you discuss any questions you have with your health care provider. Document Released: 04/27/2009 Document Revised: 02/27/2016 Document Reviewed: 11/15/2014 Elsevier Interactive Patient Education  2017 [place]. Pain Management Discharge Instructions  General Discharge Instructions :  If you need to reach your doctor call: Monday-Friday 8:00 am - 4:00 pm at [phone] or toll free [phone].  After clinic hours [phone] to have operator reach doctor.  Bring all of your medication bottles to all your appointments in the pain clinic.  To cancel or reschedule your appointment with Pain Management please remember to call 24 hours in advance to avoid a fee.  Refer to the educational materials which you have been given on: General Risks, I had my Procedure. Discharge Instructions, Post Sedation.  Post Procedure Instructions:  The drugs you were given will stay in your system until tomorrow, so for the next 24 hours you should not drive, make any legal decisions or drink any alcoholic beverages.  You may eat anything you prefer, but it is better to start with liquids then soups and crackers, and gradually work up to solid foods.  Please notify your doctor immediately if you have any unusual bleeding, trouble breathing or pain that is not related to your normal pain.  Depending on the type of procedure that was done, some parts of your body may  feel week and/or numb.  This usually clears up by tonight or the next day.  Walk with the use of an assistive device or accompanied by an adult for the 24 hours.  You may use ice on the affected area for the first 24 hours.  Put ice in a Ziploc bag and cover with a towel and place against area 15 minutes on 15 minutes off.  You may switch to heat after 24 hours.Facet Joint Block The facet joints connect the bones of the spine (vertebrae). They make it possible for you to bend, twist, and make other movements with your spine. They also prevent you from overbending, overtwisting, and making other excessive movements.  A facet joint block is a procedure where a numbing medicine (anesthetic) is injected into a facet joint. Often, a type of anti-inflammatory medicine called a steroid is also injected. A facet joint block may be done for two reasons:  Diagnosis. A facet joint block may be done as a test to see whether neck or back pain is caused by a worn-down or infected facet joint. If the pain gets better after a facet joint block, it means the pain is probably coming from the facet joint. If the pain does not get better, it means the pain is probably not coming from the facet joint.  Therapy. A facet joint block may be done to relieve neck or back pain caused by a facet joint. A facet joint block is only done as a therapy if the pain does not improve with medicine, exercise programs, physical therapy, and other forms of pain management. LET [facility] CARE PROVIDER KNOW ABOUT:  Any allergies you have.  All medicines you are taking, including vitamins, herbs, eyedrops, and over-the-counter medicines and  creams.  Previous problems you or members of your family have had with the use of anesthetics.  Any blood disorders you have had.  Other health problems you have. RISKS AND COMPLICATIONS Generally, having a facet joint block is safe. However, as with any procedure, complications can occur. Possible  complications associated with having a facet joint block include:  Bleeding.  Injury to a nerve near the injection site.  Pain at the injection site.  Weakness or numbness in areas controlled by nerves near the injection site.  Infection.  Temporary fluid retention.  Allergic reaction to anesthetics or medicines used during the procedure. BEFORE THE PROCEDURE  Follow your health care provider's instructions if you are taking dietary supplements or medicines. You may need to stop taking them or reduce your dosage.  Do not take any new dietary supplements or medicines without asking your health care provider first.  Follow your health care provider's instructions about eating and drinking before the procedure. You may need to stop eating and drinking several hours before the procedure.  Arrange to have an adult drive you home after the procedure. PROCEDURE You may need to remove your clothing and dress in an open-back gown so that your health care provider can access your spine.  The procedure will be done while you are lying on an X-ray table. Most of the time you will be asked to lie on your stomach, but you may be asked to lie in a different position if an injection will be made in your neck.  Special machines will be used to monitor your oxygen levels, heart rate, and blood pressure.  If an injection will be made in your neck, an intravenous (IV) tube will be inserted into one of your veins. Fluids and medicine will flow directly into your body through the IV tube.  The area over the facet joint where the injection will be made will be cleaned with an antiseptic soap. The surrounding skin will be covered with sterile drapes.  An anesthetic will be applied to your skin to make the injection area numb. You may feel a temporary stinging or burning sensation.  A video X-ray machine will be used to locate the joint. A contrast dye may be injected into the facet joint area to help with  locating the joint.  When the joint is located, an anesthetic medicine will be injected into the joint through the needle.  Your health care provider will ask you whether you feel pain relief. If you do feel relief, a steroid may be injected to provide pain relief for a longer period of time. If you do not feel relief or feel only partial relief, additional injections of an anesthetic may be made in other facet joints.  The needle will be removed, the skin will be cleansed, and bandages will be applied.  AFTER THE PROCEDURE  You will be observed for 15-30 minutes before being allowed to go home. Do not drive. Have an adult drive you or take a taxi or public transportation instead.  If you feel pain relief, the pain will return in several hours or days when the anesthetic wears off.  You may feel pain relief 2-14 days after the procedure. The amount of time this relief lasts varies from person to person.  It is normal to feel some tenderness over the injected area(s) for 2 days following the procedure.  If you have diabetes, you may have a temporary increase in blood sugar. This information is not  intended to replace advice given to you by your health care provider. Make sure you discuss any questions you have with your health care provider. Document Released: 11/20/2006 Document Revised: 07/22/2014 Document Reviewed: 03/27/2015 Elsevier Interactive Patient Education  2017 [place] [employer] Refer to this sheet in the next few weeks. These instructions provide you with information on caring for yourself after your procedure. Your health care provider may also give you more specific instructions. Your treatment has been planned according to current medical practices, but problems sometimes occur. Call your health care provider if you have any problems or questions after your procedure. HOME CARE INSTRUCTIONS  Keep track of the amount of pain relief you feel and how  long it lasts. Limit pain medicine within the first 4-6 hours after the procedure as directed by your health care provider. Resume taking dietary supplements and medicines as directed by your health care provider. You may resume your regular diet. Do not apply heat near or over the injection site(s) for 24 hours.  Do not take a bath or soak in water (such as a pool or lake) for 24 hours. Do not drive for 24 hours unless approved by your health care provider. Avoid strenuous activity for 24 hours. Remove your bandages the morning after the procedure.  If the injection site is tender, applying an ice pack may relieve some tenderness. To do this: Put ice in a bag. Place a towel between your skin and the bag. Leave the ice on for 15-20 minutes, 3-4 times a day. Keep follow-up appointments as directed by your health care provider. SEEK MEDICAL CARE IF:  Your pain is not controlled by your medicines.  There is drainage from the injection site.  There is significant bleeding or swelling at the injection site. You have diabetes and your blood sugar is above 180 mg/dL. SEEK IMMEDIATE MEDICAL CARE IF:  You develop a fever of 101F (38.3C) or greater.  You have worsening pain or swelling around the injection site.  You have red streaking around the injection site.  You develop severe pain that is not controlled by your medicines.  You develop a headache, stiff neck, nausea, or vomiting.  Your eyes become very sensitive to light.  You have weakness, paralysis, or tingling in your arms or legs that was not present before the procedure.  You develop difficulty urinating or breathing.  This information is not intended to replace advice given to you by your health care provider. Make sure you discuss any questions you have with your health care provider. Document Released: 06/17/2012 Document Revised: 07/22/2014 Document Reviewed: 03/27/2015 Elsevier Interactive Patient Education  2017  [employer].

## 2016-08-06 ENCOUNTER — Telehealth: Payer: Self-pay | Admitting: *Deleted

## 2016-08-06 NOTE — Telephone Encounter (Signed)
Voicemail left for patient to call our office if there are questions or concerns re; procedure on yesterday.  

## 2016-09-05 ENCOUNTER — Ambulatory Visit: Payer: Medicaid Other | Attending: Pain Medicine | Admitting: Pain Medicine

## 2016-09-05 ENCOUNTER — Encounter: Payer: Self-pay | Admitting: Pain Medicine

## 2016-09-05 VITALS — BP 117/74 | HR 81 | Temp 98.7°F | Resp 16 | Ht 65.0 in | Wt 248.0 lb

## 2016-09-05 DIAGNOSIS — M545 Low back pain: Secondary | ICD-10-CM | POA: Insufficient documentation

## 2016-09-05 DIAGNOSIS — M797 Fibromyalgia: Secondary | ICD-10-CM | POA: Insufficient documentation

## 2016-09-05 DIAGNOSIS — R51 Headache: Secondary | ICD-10-CM | POA: Diagnosis not present

## 2016-09-05 DIAGNOSIS — G894 Chronic pain syndrome: Secondary | ICD-10-CM | POA: Diagnosis not present

## 2016-09-05 DIAGNOSIS — F329 Major depressive disorder, single episode, unspecified: Secondary | ICD-10-CM | POA: Insufficient documentation

## 2016-09-05 DIAGNOSIS — G5622 Lesion of ulnar nerve, left upper limb: Secondary | ICD-10-CM | POA: Diagnosis not present

## 2016-09-05 DIAGNOSIS — G9520 Unspecified cord compression: Secondary | ICD-10-CM | POA: Insufficient documentation

## 2016-09-05 DIAGNOSIS — Z96611 Presence of right artificial shoulder joint: Secondary | ICD-10-CM | POA: Insufficient documentation

## 2016-09-05 DIAGNOSIS — E039 Hypothyroidism, unspecified: Secondary | ICD-10-CM | POA: Diagnosis not present

## 2016-09-05 DIAGNOSIS — G510 Bell's palsy: Secondary | ICD-10-CM | POA: Insufficient documentation

## 2016-09-05 DIAGNOSIS — R531 Weakness: Secondary | ICD-10-CM | POA: Insufficient documentation

## 2016-09-05 DIAGNOSIS — F17210 Nicotine dependence, cigarettes, uncomplicated: Secondary | ICD-10-CM | POA: Diagnosis not present

## 2016-09-05 DIAGNOSIS — M62830 Muscle spasm of back: Secondary | ICD-10-CM | POA: Diagnosis not present

## 2016-09-05 DIAGNOSIS — M1288 Other specific arthropathies, not elsewhere classified, other specified site: Secondary | ICD-10-CM | POA: Diagnosis not present

## 2016-09-05 DIAGNOSIS — J449 Chronic obstructive pulmonary disease, unspecified: Secondary | ICD-10-CM | POA: Diagnosis not present

## 2016-09-05 DIAGNOSIS — I10 Essential (primary) hypertension: Secondary | ICD-10-CM | POA: Insufficient documentation

## 2016-09-05 DIAGNOSIS — M542 Cervicalgia: Secondary | ICD-10-CM | POA: Diagnosis not present

## 2016-09-05 DIAGNOSIS — M25512 Pain in left shoulder: Secondary | ICD-10-CM | POA: Diagnosis not present

## 2016-09-05 DIAGNOSIS — F419 Anxiety disorder, unspecified: Secondary | ICD-10-CM | POA: Insufficient documentation

## 2016-09-05 DIAGNOSIS — K449 Diaphragmatic hernia without obstruction or gangrene: Secondary | ICD-10-CM | POA: Insufficient documentation

## 2016-09-05 DIAGNOSIS — M791 Myalgia: Secondary | ICD-10-CM | POA: Diagnosis not present

## 2016-09-05 DIAGNOSIS — M47816 Spondylosis without myelopathy or radiculopathy, lumbar region: Secondary | ICD-10-CM | POA: Diagnosis not present

## 2016-09-05 DIAGNOSIS — J302 Other seasonal allergic rhinitis: Secondary | ICD-10-CM | POA: Diagnosis not present

## 2016-09-05 DIAGNOSIS — K589 Irritable bowel syndrome without diarrhea: Secondary | ICD-10-CM | POA: Insufficient documentation

## 2016-09-05 DIAGNOSIS — M25511 Pain in right shoulder: Secondary | ICD-10-CM

## 2016-09-05 DIAGNOSIS — M47812 Spondylosis without myelopathy or radiculopathy, cervical region: Secondary | ICD-10-CM | POA: Insufficient documentation

## 2016-09-05 DIAGNOSIS — M7918 Myalgia, other site: Secondary | ICD-10-CM

## 2016-09-05 DIAGNOSIS — G8929 Other chronic pain: Secondary | ICD-10-CM | POA: Insufficient documentation

## 2016-09-05 MED ORDER — CYCLOBENZAPRINE HCL 10 MG PO TABS: 10.0000 mg | ORAL_TABLET | Freq: Three times a day (TID) | ORAL | 2 refills | Status: DC | PRN

## 2016-09-05 NOTE — Progress Notes (Signed)
Patient's Name: Danielle [name]  MRN: [id number]  Referring Provider: [facility] [facility]  DOB: 1960-10-11  PCP: [facility]  DOS: 09/05/2016  Note by: Kathlen Brunswick. [name], MD  Service setting: Ambulatory outpatient  Specialty: Interventional Pain Management  Location: [facility] (AMB) Pain Management Facility    Patient type: Established   Primary Reason(s) for Visit: Encounter for post-procedure evaluation of chronic illness with mild to moderate exacerbation CC: Back Pain (lower)  HPI  [name] is a 56 y.o. year old, female patient, who comes today for a post-procedure evaluation. She has Long term current use of opiate analgesic; Long term prescription opiate use; Opiate use (60 MME/Day); B12 deficiency; Cervical spinal cord compression [facility]) (April, 2017); Cervical spinal stenosis; Chronic obstructive pulmonary disease ([place]); Clinical depression; BP (high blood pressure); Adult hypothyroidism; Adiposity; Mucositis oral; Groin pain; Current tobacco use; Chronic shoulder pain (Location of Primary Source of Pain) (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); History of total shoulder replacement (Bilateral); Chronic hip pain (Location of Secondary source of pain) (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); 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); Chronic low back pain (Location of Tertiary source of pain) (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); Lumbar facet syndrome (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); Lumbar spondylosis; Failed back surgical syndrome; Chronic lower extremity pain (Right); Chronic neck pain (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); Chronic cervical radicular pain (Right); Ulnar neuropathy of left upper extremity; Hx of cervical spine surgery; Cervical spondylosis; Encounter for therapeutic drug level monitoring; Encounter for pain management planning; Fibromyalgia; Chronic sacroiliac pain (B) (R>L); Seasonal allergic rhinitis; Status post total replacement of right shoulder; Depression; Hypertension; Hypothyroid; Chronic pain syndrome; Complaints of weakness of lower extremity; Morbid obesity  with BMI of 40.0-44.9, adult ([place]); Anterolisthesis; Cervical facet syndrome; Chronic musculoskeletal pain; and Muscle spasm of back on her problem list. Her primarily concern today is the Back Pain (lower)  Pain Assessment: Self-Reported Pain Score: 4 /10             Reported level is compatible with observation.       Pain Type: Chronic pain Pain Location: Back Pain Orientation: Lower Pain Descriptors / Indicators: Pressure, Aching, Dull Pain Frequency: Constant  Danielle [name] comes in today for post-procedure evaluation after the treatment done on 08/05/2016.  Further details on both, my assessment(s), as well as the proposed treatment plan, please see below.  Post-Procedure Assessment  08/05/2016 Procedure: Diagnostic bilateral lumbar facet block under fluoroscopic guidance and IV sedation Post-procedure pain score: 0/10 (100% relief) Influential Factors: BMI: 41.27 kg/m Intra-procedural challenges: None observed Assessment challenges: None detected         Post-procedural side-effects, adverse reactions, or complications: None reported Reported issues: None  Sedation: Sedation provided. When no sedatives are used, the analgesic levels obtained are directly associated to the effectiveness of the local anesthetics. However, when sedation is provided, the level of analgesia obtained during the initial 1 hour following the intervention, is believed to be the result of a combination of factors. These factors may include, but are not limited to: 1. The effectiveness of the local anesthetics used. 2. The effects of the analgesic(s) and/or anxiolytic(s) used. 3. The degree of discomfort experienced by the patient at the time of the procedure. 4. The patients ability and reliability in recalling and recording the events. 5. The presence and influence of possible secondary gains and/or psychosocial factors. Reported result: Relief experienced during the 1st hour after the procedure: 100 %  (Ultra-Short Term Relief) Interpretative annotation: Analgesia during this period is likely to be Local Anesthetic and/or IV Sedative (Analgesic/Anxiolitic) related.  Effects of local anesthetic: The analgesic effects attained during this period are directly associated to the localized infiltration of local anesthetics and therefore cary significant diagnostic value as to the etiological location, or anatomical origin, of the pain. Expected duration of relief is directly dependent on the pharmacodynamics of the local anesthetic used. Long-acting (4-6 hours) anesthetics used.  Reported result: Relief during the next 4 to 6 hour after the procedure: 100 % (Short-Term Relief) Interpretative annotation: Complete relief would suggest area to be the source of the pain.          Long-term benefit: Defined as the period of time past the expected duration of local anesthetics. With the possible exception of prolonged sympathetic blockade from the local anesthetics, benefits during this period are typically attributed to, or associated with, other factors such as analgesic sensory neuropraxia, antiinflammatory effects, or beneficial biochemical changes provided by agents other than the local anesthetics Reported result: Extended relief following procedure: 80 % (lasted 10-12 days, then no relief) (Long-Term Relief) Interpretative annotation: Good relief. This could suggest inflammation to be a significant component in the etiology to the pain.          Current benefits: Defined as persistent relief that continues at this point in time.   Reported results: Treated area: 0 % Danielle [name] reports improvement in function Interpretative annotation: Recurrance of symptoms. This would suggest persistent aggravating factors  Interpretation: Results would suggest a successful diagnostic intervention.          Laboratory Chemistry  Inflammation Markers Lab Results  Component Value Date   ESRSEDRATE 37 (H)  01/08/2016   CRP 0.9 01/08/2016   Renal Function Lab Results  Component Value Date   BUN 8 01/08/2016   CREATININE 0.94 01/08/2016   GFRAA >60 01/08/2016   GFRNONAA >60 01/08/2016   Hepatic Function Lab Results  Component Value Date   AST 18 01/08/2016   ALT 14 01/08/2016   ALBUMIN 3.7 01/08/2016   Electrolytes Lab Results  Component Value Date   NA 137 01/08/2016   K 3.5 01/08/2016   CL 101 01/08/2016   CALCIUM 9.0 01/08/2016   MG 2.1 01/08/2016   Pain Modulating Vitamins Lab Results  Component Value Date   25OHVITD1 32 01/08/2016   25OHVITD2 2.0 01/08/2016   25OHVITD3 30 01/08/2016   VITAMINB12 >7,500 (H) 01/08/2016   Coagulation Parameters No results found for: INR, LABPROT, APTT, PLT Cardiovascular No results found for: BNP, HGB, HCT Note: Lab results reviewed.  Recent Diagnostic Imaging Review  Dg C-arm 1-60 Min-no Report  Result Date: 08/05/2016 There is no Radiologist interpretation  for this exam.  Note: Imaging results reviewed.          Meds  The patient has a current medication list which includes the following prescription(s): albuterol, cetirizine, vitamin d3, citalopram, cyanocobalamin, cyclobenzaprine, diclofenac sodium, levothyroxine, magnesium hydroxide, naloxone, nystatin, oxycodone hcl, oxycodone hcl, spiriva handihaler, and oxycodone hcl.  Current Outpatient Prescriptions on File Prior to Visit  Medication Sig  . albuterol (PROVENTIL HFA;VENTOLIN HFA) 108 (90 Base) MCG/ACT inhaler Inhale 2 puffs into the lungs every 4 (four) hours as needed for wheezing or shortness of breath.   . cetirizine (ZYRTEC) 10 MG tablet Take 10 mg by mouth daily.   . Cholecalciferol (VITAMIN D3) 2000 units capsule Take 2,000 Units by mouth daily.   . citalopram (CELEXA) 40 MG tablet Take 40 mg by mouth daily.  . cyanocobalamin (,VITAMIN B-12,) 1000 MCG/ML injection Inject into the muscle.  . diclofenac  sodium (VOLTAREN) 1 % GEL Apply 2 g topically 4 (four) times  daily.   Marland Kitchen levothyroxine (SYNTHROID, LEVOTHROID) 50 MCG tablet TAKE 1 TABLET (50 MCG TOTAL) BY MOUTH DAILY AT 0600.  . magnesium hydroxide (MILK OF MAGNESIA) 400 MG/5ML suspension Take by mouth.  . naloxone (NARCAN) 2 MG/2ML injection Inject content of syringe into thigh muscle. Call 911.  . nystatin (MYCOSTATIN/NYSTOP) powder Apply topically.  . Oxycodone HCl 10 MG TABS Take 1 tablet (10 mg total) by mouth every 6 (six) hours as needed.  Derrill Memo ON 10/01/2016] Oxycodone HCl 10 MG TABS Take 1 tablet (10 mg total) by mouth every 6 (six) hours as needed.  Marland Kitchen SPIRIVA HANDIHALER 18 MCG inhalation capsule PLACE 1 CAPSULE (18 MCG TOTAL) INTO INHALER AND INHALE ONCE DAILY.  Marland Kitchen Oxycodone HCl 10 MG TABS Take 1 tablet (10 mg total) by mouth every 6 (six) hours as needed.   No current facility-administered medications on file prior to visit.    ROS  Constitutional: Denies any fever or chills Gastrointestinal: No reported hemesis, hematochezia, vomiting, or acute GI distress Musculoskeletal: Denies any acute onset joint swelling, redness, loss of ROM, or weakness Neurological: No reported episodes of acute onset apraxia, aphasia, dysarthria, agnosia, amnesia, paralysis, loss of coordination, or loss of consciousness  Allergies  Ms. Luviano is allergic to aripiprazole; duloxetine; gabapentin; gold; iodinated diagnostic agents; naproxen sodium; nortriptyline hcl; nsaids; pregabalin; ace inhibitors; aspirin; cefpodoxime; fluoxetine; fluticasone-salmeterol; levofloxacin; lithium; meperidine; paroxetine; tape; telithromycin; theophylline; topiramate; trazodone; triamcinolone; venlafaxine; bupropion; ketorolac tromethamine; morphine; and moxifloxacin.  Exira  Drug: Ms. Gladen  reports that she does not use drugs. Alcohol:  reports that she drinks alcohol. Tobacco:  reports that she has been smoking Cigarettes.  She has a 40.00 pack-year smoking history. She has never used smokeless tobacco. Medical:  has a past  medical history of Anxiety; Asthma; Bell's palsy; COPD (chronic obstructive pulmonary disease) ([place]); Depression; Fibromyalgia; Heart murmur; Hepatitis C; Hiatal hernia; IBS (irritable bowel syndrome); Insomnia; and Thyroid disease. Family: family history includes Cancer in her mother; Gout in her mother; Heart disease in her father.  Past Surgical History:  Procedure Laterality Date  . BACK SURGERY    . CARPAL TUNNEL RELEASE Right   . ELBOW SURGERY Left   . FOOT SURGERY Right   . necksurgery    . NOSE SURGERY    . PARTIAL HYSTERECTOMY    . TOTAL SHOULDER REPLACEMENT Bilateral    Constitutional Exam  General appearance: Well nourished, well developed, and well hydrated. In no apparent acute distress Vitals:   09/05/16 1402  BP: 117/74  Pulse: 81  Resp: 16  Temp: 98.7 F (37.1 C)  TempSrc: Oral  SpO2: 100%  Weight: 248 lb (112.5 kg)  Height: 5\' 5"  (1.651 m)   BMI Assessment: Estimated body mass index is 41.27 kg/m as calculated from the following:   Height as of this encounter: 5\' 5"  (1.651 m).   Weight as of this encounter: 248 lb (112.5 kg).  BMI interpretation table: BMI level Category Range association with higher incidence of chronic pain  <18 kg/m2 Underweight   18.5-24.9 kg/m2 Ideal body weight   25-29.9 kg/m2 Overweight Increased incidence by 20%  30-34.9 kg/m2 Obese (Class I) Increased incidence by 68%  35-39.9 kg/m2 Severe obesity (Class II) Increased incidence by 136%  >40 kg/m2 Extreme obesity (Class III) Increased incidence by 254%   BMI Readings from Last 4 Encounters:  09/05/16 41.27 kg/m  08/05/16 36.18 kg/m  07/25/16 41.60 kg/m  04/30/16 38.27 kg/m   Wt Readings from Last 4 Encounters:  09/05/16 248 lb (112.5 kg)  08/05/16 245 lb (111.1 kg)  07/25/16 250 lb (113.4 kg)  04/30/16 230 lb (104.3 kg)  Psych/Mental status: Alert, oriented x 3 (person, place, & time)       Eyes: PERLA Respiratory: No evidence of acute respiratory  distress  Cervical Spine Exam  Inspection: No masses, redness, or swelling Alignment: Symmetrical Functional ROM: Limited ROM Stability: No instability detected Muscle strength & Tone: Functionally intact Sensory: Movement-associated pain Palpation: Complains of area being tender to palpation  Upper Extremity (UE) Exam    Side: Right upper extremity  Side: Left upper extremity  Inspection: No masses, redness, swelling, or asymmetry. No contractures  Inspection: No masses, redness, swelling, or asymmetry. No contractures  Functional ROM: Decreased ROM for shoulder  Functional ROM: Decreased ROM for shoulder  Muscle strength & Tone: Functionally intact  Muscle strength & Tone: Functionally intact  Sensory: Unimpaired  Sensory: Unimpaired  Palpation: Euthermic  Palpation: Euthermic  Specialized Test(s): Deferred         Specialized Test(s): Deferred          Thoracic Spine Exam  Inspection: No masses, redness, or swelling Alignment: Symmetrical Functional ROM: Unrestricted ROM Stability: No instability detected Sensory: Unimpaired Muscle strength & Tone: Functionally intact Palpation: Non-contributory  Lumbar Spine Exam  Inspection: No masses, redness, or swelling Alignment: Symmetrical Functional ROM: Unrestricted ROM Stability: No instability detected Muscle strength & Tone: Functionally intact Sensory: Unimpaired Palpation: Non-contributory Provocative Tests: Lumbar Hyperextension and rotation test: evaluation deferred today       Patrick's Maneuver: evaluation deferred today              Gait & Posture Assessment  Ambulation: Unassisted Gait: Relatively normal for age and body habitus Posture: WNL   Lower Extremity Exam    Side: Right lower extremity  Side: Left lower extremity  Inspection: No masses, redness, swelling, or asymmetry. No contractures  Inspection: No masses, redness, swelling, or asymmetry. No contractures  Functional ROM: Unrestricted ROM           Functional ROM: Unrestricted ROM          Muscle strength & Tone: Functionally intact  Muscle strength & Tone: Functionally intact  Sensory: Unimpaired  Sensory: Unimpaired  Palpation: No palpable anomalies  Palpation: No palpable anomalies   Assessment  Primary Diagnosis & Pertinent Problem List: The primary encounter diagnosis was Chronic neck pain (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). Diagnoses of Cervical facet syndrome, Chronic musculoskeletal pain, Muscle spasm of back, Chronic shoulder pain (Location of Primary Source of Pain) (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), and Status post total replacement of right shoulder were also pertinent to this visit.  Status Diagnosis  Worsening Worsening Controlled 1. Chronic neck pain (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)   2. Cervical facet syndrome   3. Chronic musculoskeletal pain   4. Muscle spasm of back   5. Chronic shoulder pain (Location of Primary Source of Pain) (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)   6. Status post total replacement of right shoulder      Plan of Care  Pharmacotherapy (Medications Ordered): Meds ordered this encounter  Medications  . cyclobenzaprine (FLEXERIL) 10 MG tablet    Sig: Take 1 tablet (10 mg total) by mouth 3 (three) times daily as needed for muscle spasms.    Dispense:  90 tablet    Refill:  2    Do not place medication on "Automatic Refill". Fill one day early if pharmacy is closed on scheduled refill  date.   New Prescriptions   No medications on file   Medications administered today: Ms. Vancil had no medications administered during this visit. Lab-work, procedure(s), and/or referral(s): Orders Placed This Encounter  Procedures  . CERVICAL FACET (MEDIAL BRANCH NERVE BLOCK)   . Radiofrequency Shoulder Joint  . DG Cervical Spine Complete   Imaging and/or referral(s): DG CERVICAL SPINE COMPLETE  Interventional therapies: Planned, scheduled, and/or pending:   Diagnostic bilateral cervical facet block under fluoroscopic guidance and IV sedation. Bilateral  suprascapular nerve radiofrequencyablation   Considering:   Palliative bilateral suprascapular nerve block Possible bilateral suprascapular nerve radiofrequencyablation Diagnostic bilateral intra-articular hip joint injection Possible bilateral hip joint radiofrequencyablation Diagnostic bilateral lumbar facet block Possible bilaterallumbar facet radiofrequencyablation Diagnostic caudal epiduralsteroid injection + diagnostic epidurogram Possible Racz procedure. Diagnostic bilateral cervical facet block Possible bilateral cervical facet radiofrequencyablation Diagnostic right-sided cervical epidural steroid injection Diagnostic bilateral sacroiliac joint block Possible bilateral sacroiliac joint RFA   Palliative PRN treatment(s):   Palliative bilateral suprascapular nerve block Diagnostic bilateral intra-articular hip joint injection Diagnostic bilateral lumbar facet block Diagnostic caudal epiduralsteroid injection + diagnostic epidurogram Diagnostic bilateral cervical facet block Diagnostic right-sided cervical epidural steroid injection Diagnostic bilateral sacroiliac joint block   Provider-requested follow-up: Return for procedure ([facility]).  Future Appointments Date Time Provider [place]  09/11/2016 3:15 PM Robert Bellow, MD [facility] None  09/16/2016 1:30 PM Gloriajean Dell, RD [facility] None  09/18/2016 9:45 AM Milinda Pointer, MD [facility] None  10/24/2016 1:00 PM Milinda Pointer, MD [facility] None   Primary Care Physician: [facility] Location: [facility] Facility Note by: Remy Voiles A. [name], M.D, DABA, DABAPM, DABPM, DABIPP, FIPP Date: 09/05/2016; Time: 7:32 PM  Pain Score Disclaimer: We use the NRS-11 scale. This is a self-reported, subjective measurement of pain severity with only modest accuracy. It is used primarily to identify changes within a particular patient. It must be understood that outpatient pain scales  are significantly less accurate that those used for research, where they can be applied under ideal controlled circumstances with minimal exposure to variables. In reality, the score is likely to be a combination of pain intensity and pain affect, where pain affect describes the degree of emotional arousal or changes in action readiness caused by the sensory experience of pain. Factors such as social and work situation, setting, emotional state, anxiety levels, expectation, and prior pain experience may influence pain perception and show large inter-individual differences that may also be affected by time variables.  Patient instructions provided during this appointment: Patient Instructions   Pain Score  Introduction: The pain score used by this practice is the Verbal Numerical Rating Scale (VNRS-11). This is an 11-point scale. It is for adults and children 10 years or older. There are significant differences in how the pain score is reported, used, and applied. Forget everything you learned in the past and learn this scoring system.  General Information: The scale should reflect your current level of pain. Unless you are specifically asked for the level of your worst pain, or your average pain. If you are asked for one of these two, then it should be understood that it is over the past 24 hours.  Basic Activities of Daily Living (ADL): Personal hygiene, dressing, eating, transferring, and using restroom.  Instructions: Most patients tend to report their level of pain as a combination of two factors, their physical pain and their psychosocial pain. This last one is also known as "suffering" and it is reflection of how physical  pain affects you socially and psychologically. From now on, report them separately. From this point on, when asked to report your pain level, report only your physical pain. Use the following table for reference.  Pain Clinic Pain Levels (0-5/10)  Pain Level Score Description   No Pain 0   Mild pain 1 Nagging, annoying, but does not interfere with basic activities of daily living (ADL). Patients are able to eat, bathe, get dressed, toileting (being able to get on and off the toilet and perform personal hygiene functions), transfer (move in and out of bed or a chair without assistance), and maintain continence (able to control bladder and bowel functions). Blood pressure and heart rate are unaffected. A normal heart rate for a healthy adult ranges from 60 to 100 bpm (beats per minute).   Mild to moderate pain 2 Noticeable and distracting. Impossible to hide from other people. More frequent flare-ups. Still possible to adapt and function close to normal. It can be very annoying and may have occasional stronger flare-ups. With discipline, patients may get used to it and adapt.   Moderate pain 3 Interferes significantly with activities of daily living (ADL). It becomes difficult to feed, bathe, get dressed, get on and off the toilet or to perform personal hygiene functions. Difficult to get in and out of bed or a chair without assistance. Very distracting. With effort, it can be ignored when deeply involved in activities.   Moderately severe pain 4 Impossible to ignore for more than a few minutes. With effort, patients may still be able to manage work or participate in some social activities. Very difficult to concentrate. Signs of autonomic nervous system discharge are evident: dilated pupils (mydriasis); mild sweating (diaphoresis); sleep interference. Heart rate becomes elevated (>115 bpm). Diastolic blood pressure (lower number) rises above 100 mmHg. Patients find relief in laying down and not moving.   Severe pain 5 Intense and extremely unpleasant. Associated with frowning face and frequent crying. Pain overwhelms the senses.  Ability to do any activity or maintain social relationships becomes significantly limited. Conversation becomes difficult. Pacing back and forth is  common, as getting into a comfortable position is nearly impossible. Pain wakes you up from deep sleep. Physical signs will be obvious: pupillary dilation; increased sweating; goosebumps; brisk reflexes; cold, clammy hands and feet; nausea, vomiting or dry heaves; loss of appetite; significant sleep disturbance with inability to fall asleep or to remain asleep. When persistent, significant weight loss is observed due to the complete loss of appetite and sleep deprivation.  Blood pressure and heart rate becomes significantly elevated. Caution: If elevated blood pressure triggers a pounding headache associated with blurred vision, then the patient should immediately seek attention at an urgent or emergency care unit, as these may be signs of an impending stroke.    Emergency Department Pain Levels (6-10/10)  Emergency Room Pain 6 Severely limiting. Requires emergency care and should not be seen or managed at an outpatient pain management facility. Communication becomes difficult and requires great effort. Assistance to reach the emergency department may be required. Facial flushing and profuse sweating along with potentially dangerous increases in heart rate and blood pressure will be evident.   Distressing pain 7 Self-care is very difficult. Assistance is required to transport, or use restroom. Assistance to reach the emergency department will be required. Tasks requiring coordination, such as bathing and getting dressed become very difficult.   Disabling pain 8 Self-care is no longer possible. At this level, pain is disabling. The individual  is unable to do even the most "basic" activities such as walking, eating, bathing, dressing, transferring to a bed, or toileting. Fine motor skills are lost. It is difficult to think clearly.   Incapacitating pain 9 Pain becomes incapacitating. Thought processing is no longer possible. Difficult to remember your own name. Control of movement and coordination are  lost.   The worst pain imaginable 10 At this level, most patients pass out from pain. When this level is reached, collapse of the autonomic nervous system occurs, leading to a sudden drop in blood pressure and heart rate. This in turn results in a temporary and dramatic drop in blood flow to the brain, leading to a loss of consciousness. Fainting is one of the body's self defense mechanisms. Passing out puts the brain in a calmed state and causes it to shut down for a while, in order to begin the healing process.    Summary: 1. Refer to this scale when providing [place] with your pain level. 2. Be accurate and careful when reporting your pain level. This will help with your care. 3. Over-reporting your pain level will lead to loss of credibility. 4. Even a level of 1/10 means that there is pain and will be treated at our facility. 5. High, inaccurate reporting will be documented as "Symptom Exaggeration", leading to loss of credibility and suspicions of possible secondary gains such as obtaining more narcotics, or wanting to appear disabled, for fraudulent reasons. 6. Only pain levels of 5 or below will be seen at our facility. 7. Pain levels of 6 and above will be sent to the Emergency Department and the appointment cancelled.  You were given one prescription for Flexeril today. Please get your x-rays done today, if possible. Facet Blocks Patient Information  Description: The facets are joints in the spine between the vertebrae.  Like any joints in the body, facets can become irritated and painful.  Arthritis can also effect the facets.  By injecting steroids and local anesthetic in and around these joints, we can temporarily block the nerve supply to them.  Steroids act directly on irritated nerves and tissues to reduce selling and inflammation which often leads to decreased pain.  Facet blocks may be done anywhere along the spine from the neck to the low back depending upon the location of your  pain.   After numbing the skin with local anesthetic (like Novocaine), a small needle is passed onto the facet joints under x-ray guidance.  You may experience a sensation of pressure while this is being done.  The entire block usually lasts about 15-25 minutes.   Conditions which may be treated by facet blocks:   Low back/buttock pain  Neck/shoulder pain  Certain types of headaches  Preparation for the injection:  1. Do not eat any solid food or dairy products within 8 hours of your appointment. 2. You may drink clear liquid up to 3 hours before appointment.  Clear liquids include water, black coffee, juice or soda.  No milk or cream please. 3. You may take your regular medication, including pain medications, with a sip of water before your appointment.  Diabetics should hold regular insulin (if taken separately) and take 1/2 normal NPH dose the morning of the procedure.  Carry some sugar containing items with you to your appointment. 4. A driver must accompany you and be prepared to drive you home after your procedure. 5. Bring all your current medications with you. 6. An IV may be inserted and sedation  may be given at the discretion of the physician. 7. A blood pressure cuff, EKG and other monitors will often be applied during the procedure.  Some patients may need to have extra oxygen administered for a short period. 8. You will be asked to provide medical information, including your allergies and medications, prior to the procedure.  We must know immediately if you are taking blood thinners (like Coumadin/Warfarin) or if you are allergic to IV iodine contrast (dye).  We must know if you could possible be pregnant.  Possible side-effects:   Bleeding from needle site  Infection (rare, may require surgery)  Nerve injury (rare)  Numbness & tingling (temporary)  Difficulty urinating (rare, temporary)  Spinal headache (a headache worse with upright posture)  Light-headedness  (temporary)  Pain at injection site (serveral days)  Decreased blood pressure (rare, temporary)  Weakness in arm/leg (temporary)  Pressure sensation in back/neck (temporary)   Call if you experience:   Fever/chills associated with headache or increased back/neck pain  Headache worsened by an upright position  New onset, weakness or numbness of an extremity below the injection site  Hives or difficulty breathing (go to the emergency room)  Inflammation or drainage at the injection site(s)  Severe back/neck pain greater than usual  New symptoms which are concerning to you  Please note:  Although the local anesthetic injected can often make your back or neck feel good for several hours after the injection, the pain will likely return. It takes 3-7 days for steroids to work.  You may not notice any pain relief for at least one week.  If effective, we will often do a series of 2-3 injections spaced 3-6 weeks apart to maximally decrease your pain.  After the initial series, you may be a candidate for a more permanent nerve block of the facets.  If you have any questions, please call #[phone]) [facility] _____________________________________________________________________________________________

## 2016-09-05 NOTE — Patient Instructions (Addendum)
Pain Score  Introduction: The pain score used by this practice is the Verbal Numerical Rating Scale (VNRS-11). This is an 11-point scale. It is for adults and children 10 years or older. There are significant differences in how the pain score is reported, used, and applied. Forget everything you learned in the past and learn this scoring system.  General Information: The scale should reflect your current level of pain. Unless you are specifically asked for the level of your worst pain, or your average pain. If you are asked for one of these two, then it should be understood that it is over the past 24 hours.  Basic Activities of Daily Living (ADL): Personal hygiene, dressing, eating, transferring, and using restroom.  Instructions: Most patients tend to report their level of pain as a combination of two factors, their physical pain and their psychosocial pain. This last one is also known as "suffering" and it is reflection of how physical pain affects you socially and psychologically. From now on, report them separately. From this point on, when asked to report your pain level, report only your physical pain. Use the following table for reference.  Pain Clinic Pain Levels (0-5/10)  Pain Level Score Description  No Pain 0   Mild pain 1 Nagging, annoying, but does not interfere with basic activities of daily living (ADL). Patients are able to eat, bathe, get dressed, toileting (being able to get on and off the toilet and perform personal hygiene functions), transfer (move in and out of bed or a chair without assistance), and maintain continence (able to control bladder and bowel functions). Blood pressure and heart rate are unaffected. A normal heart rate for a healthy adult ranges from 60 to 100 bpm (beats per minute).   Mild to moderate pain 2 Noticeable and distracting. Impossible to hide from other people. More frequent flare-ups. Still possible to adapt and function close to normal. It can be very  annoying and may have occasional stronger flare-ups. With discipline, patients may get used to it and adapt.   Moderate pain 3 Interferes significantly with activities of daily living (ADL). It becomes difficult to feed, bathe, get dressed, get on and off the toilet or to perform personal hygiene functions. Difficult to get in and out of bed or a chair without assistance. Very distracting. With effort, it can be ignored when deeply involved in activities.   Moderately severe pain 4 Impossible to ignore for more than a few minutes. With effort, patients may still be able to manage work or participate in some social activities. Very difficult to concentrate. Signs of autonomic nervous system discharge are evident: dilated pupils (mydriasis); mild sweating (diaphoresis); sleep interference. Heart rate becomes elevated (>115 bpm). Diastolic blood pressure (lower number) rises above 100 mmHg. Patients find relief in laying down and not moving.   Severe pain 5 Intense and extremely unpleasant. Associated with frowning face and frequent crying. Pain overwhelms the senses.  Ability to do any activity or maintain social relationships becomes significantly limited. Conversation becomes difficult. Pacing back and forth is common, as getting into a comfortable position is nearly impossible. Pain wakes you up from deep sleep. Physical signs will be obvious: pupillary dilation; increased sweating; goosebumps; brisk reflexes; cold, clammy hands and feet; nausea, vomiting or dry heaves; loss of appetite; significant sleep disturbance with inability to fall asleep or to remain asleep. When persistent, significant weight loss is observed due to the complete loss of appetite and sleep deprivation.  Blood pressure and heart   rate becomes significantly elevated. Caution: If elevated blood pressure triggers a pounding headache associated with blurred vision, then the patient should immediately seek attention at an urgent or  emergency care unit, as these may be signs of an impending stroke.    Emergency Department Pain Levels (6-10/10)  Emergency Room Pain 6 Severely limiting. Requires emergency care and should not be seen or managed at an outpatient pain management facility. Communication becomes difficult and requires great effort. Assistance to reach the emergency department may be required. Facial flushing and profuse sweating along with potentially dangerous increases in heart rate and blood pressure will be evident.   Distressing pain 7 Self-care is very difficult. Assistance is required to transport, or use restroom. Assistance to reach the emergency department will be required. Tasks requiring coordination, such as bathing and getting dressed become very difficult.   Disabling pain 8 Self-care is no longer possible. At this level, pain is disabling. The individual is unable to do even the most "basic" activities such as walking, eating, bathing, dressing, transferring to a bed, or toileting. Fine motor skills are lost. It is difficult to think clearly.   Incapacitating pain 9 Pain becomes incapacitating. Thought processing is no longer possible. Difficult to remember your own name. Control of movement and coordination are lost.   The worst pain imaginable 10 At this level, most patients pass out from pain. When this level is reached, collapse of the autonomic nervous system occurs, leading to a sudden drop in blood pressure and heart rate. This in turn results in a temporary and dramatic drop in blood flow to the brain, leading to a loss of consciousness. Fainting is one of the body's self defense mechanisms. Passing out puts the brain in a calmed state and causes it to shut down for a while, in order to begin the healing process.    Summary: 1. Refer to this scale when providing [place] with your pain level. 2. Be accurate and careful when reporting your pain level. This will help with your care. 3. Over-reporting  your pain level will lead to loss of credibility. 4. Even a level of 1/10 means that there is pain and will be treated at our facility. 5. High, inaccurate reporting will be documented as "Symptom Exaggeration", leading to loss of credibility and suspicions of possible secondary gains such as obtaining more narcotics, or wanting to appear disabled, for fraudulent reasons. 6. Only pain levels of 5 or below will be seen at our facility. 7. Pain levels of 6 and above will be sent to the Emergency Department and the appointment cancelled.  You were given one prescription for Flexeril today. Please get your x-rays done today, if possible. Facet Blocks Patient Information  Description: The facets are joints in the spine between the vertebrae.  Like any joints in the body, facets can become irritated and painful.  Arthritis can also effect the facets.  By injecting steroids and local anesthetic in and around these joints, we can temporarily block the nerve supply to them.  Steroids act directly on irritated nerves and tissues to reduce selling and inflammation which often leads to decreased pain.  Facet blocks may be done anywhere along the spine from the neck to the low back depending upon the location of your pain.   After numbing the skin with local anesthetic (like Novocaine), a small needle is passed onto the facet joints under x-ray guidance.  You may experience a sensation of pressure while this is being done.  The entire block usually lasts about 15-25 minutes.   Conditions which may be treated by facet blocks:   Low back/buttock pain  Neck/shoulder pain  Certain types of headaches  Preparation for the injection:  1. Do not eat any solid food or dairy products within 8 hours of your appointment. 2. You may drink clear liquid up to 3 hours before appointment.  Clear liquids include water, black coffee, juice or soda.  No milk or cream please. 3. You may take your regular medication,  including pain medications, with a sip of water before your appointment.  Diabetics should hold regular insulin (if taken separately) and take 1/2 normal NPH dose the morning of the procedure.  Carry some sugar containing items with you to your appointment. 4. A driver must accompany you and be prepared to drive you home after your procedure. 5. Bring all your current medications with you. 6. An IV may be inserted and sedation may be given at the discretion of the physician. 7. A blood pressure cuff, EKG and other monitors will often be applied during the procedure.  Some patients may need to have extra oxygen administered for a short period. 8. You will be asked to provide medical information, including your allergies and medications, prior to the procedure.  We must know immediately if you are taking blood thinners (like Coumadin/Warfarin) or if you are allergic to IV iodine contrast (dye).  We must know if you could possible be pregnant.  Possible side-effects:   Bleeding from needle site  Infection (rare, may require surgery)  Nerve injury (rare)  Numbness & tingling (temporary)  Difficulty urinating (rare, temporary)  Spinal headache (a headache worse with upright posture)  Light-headedness (temporary)  Pain at injection site (serveral days)  Decreased blood pressure (rare, temporary)  Weakness in arm/leg (temporary)  Pressure sensation in back/neck (temporary)   Call if you experience:   Fever/chills associated with headache or increased back/neck pain  Headache worsened by an upright position  New onset, weakness or numbness of an extremity below the injection site  Hives or difficulty breathing (go to the emergency room)  Inflammation or drainage at the injection site(s)  Severe back/neck pain greater than usual  New symptoms which are concerning to you  Please note:  Although the local anesthetic injected can often make your back or neck feel good for  several hours after the injection, the pain will likely return. It takes 3-7 days for steroids to work.  You may not notice any pain relief for at least one week.  If effective, we will often do a series of 2-3 injections spaced 3-6 weeks apart to maximally decrease your pain.  After the initial series, you may be a candidate for a more permanent nerve block of the facets.  If you have any questions, please call #[phone]) [facility] _____________________________________________________________________________________________

## 2016-09-05 NOTE — Progress Notes (Signed)
Safety precautions to be maintained throughout the outpatient stay will include: orient to surroundings, keep bed in low position, maintain call bell within reach at all times, provide assistance with transfer out of bed and ambulation.  

## 2016-09-11 ENCOUNTER — Encounter: Payer: Self-pay | Admitting: General Surgery

## 2016-09-11 ENCOUNTER — Ambulatory Visit (INDEPENDENT_AMBULATORY_CARE_PROVIDER_SITE_OTHER): Payer: Medicaid Other | Admitting: General Surgery

## 2016-09-11 VITALS — BP 138/78 | HR 74 | Resp 16 | Ht 65.0 in | Wt 253.0 lb

## 2016-09-11 DIAGNOSIS — Z1211 Encounter for screening for malignant neoplasm of colon: Secondary | ICD-10-CM

## 2016-09-11 DIAGNOSIS — K625 Hemorrhage of anus and rectum: Secondary | ICD-10-CM | POA: Diagnosis not present

## 2016-09-11 MED ORDER — HYDROCORTISONE ACE-PRAMOXINE 2.5-1 % RE CREA: 1.0000 "application " | TOPICAL_CREAM | Freq: Two times a day (BID) | RECTAL | 0 refills | Status: DC

## 2016-09-11 MED ORDER — POLYETHYLENE GLYCOL 3350 17 GM/SCOOP PO POWD: ORAL | 0 refills | Status: DC

## 2016-09-11 NOTE — Patient Instructions (Signed)
Colonoscopy, Adult A colonoscopy is an exam to look at the entire large intestine. During the exam, a lubricated, bendable tube is inserted into the anus and then passed into the rectum, colon, and other parts of the large intestine. A colonoscopy is often done as a part of normal colorectal screening or in response to certain symptoms, such as anemia, persistent diarrhea, abdominal pain, and blood in the stool. The exam can help screen for and diagnose medical problems, including:  Tumors.  Polyps.  Inflammation.  Areas of bleeding. Tell a health care provider about:  Any allergies you have.  All medicines you are taking, including vitamins, herbs, eye drops, creams, and over-the-counter medicines.  Any problems you or family members have had with anesthetic medicines.  Any blood disorders you have.  Any surgeries you have had.  Any medical conditions you have.  Any problems you have had passing stool. What are the risks? Generally, this is a safe procedure. However, problems may occur, including:  Bleeding.  A tear in the intestine.  A reaction to medicines given during the exam.  Infection (rare). What happens before the procedure? Eating and drinking restrictions  Follow instructions from your health care provider about eating and drinking, which may include:  A few days before the procedure - follow a low-fiber diet. Avoid nuts, seeds, dried fruit, raw fruits, and vegetables.  1-3 days before the procedure - follow a clear liquid diet. Drink only clear liquids, such as clear broth or bouillon, black coffee or tea, clear juice, clear soft drinks or sports drinks, gelatin dessert, and popsicles. Avoid any liquids that contain red or purple dye.  On the day of the procedure - do not eat or drink anything during the 2 hours before the procedure, or within the time period that your health care provider recommends. Bowel prep  If you were prescribed an oral bowel prep to  clean out your colon:  Take it as told by your health care provider. Starting the day before your procedure, you will need to drink a large amount of medicated liquid. The liquid will cause you to have multiple loose stools until your stool is almost clear or light green.  If your skin or anus gets irritated from diarrhea, you may use these to relieve the irritation:  Medicated wipes, such as adult wet wipes with aloe and vitamin E.  A skin soothing-product like petroleum jelly.  If you vomit while drinking the bowel prep, take a break for up to 60 minutes and then begin the bowel prep again. If vomiting continues and you cannot take the bowel prep without vomiting, call your health care provider. General instructions   Ask your health care provider about changing or stopping your regular medicines. This is especially important if you are taking diabetes medicines or blood thinners.  Plan to have someone take you home from the hospital or clinic. What happens during the procedure?  An IV tube may be inserted into one of your veins.  You will be given medicine to help you relax (sedative).  To reduce your risk of infection:  Your health care team will wash or sanitize their hands.  Your anal area will be washed with soap.  You will be asked to lie on your side with your knees bent.  Your health care provider will lubricate a long, thin, flexible tube. The tube will have a camera and a light on the end.  The tube will be inserted into your anus.    The tube will be gently eased through your rectum and colon.  Air will be delivered into your colon to keep it open. You may feel some pressure or cramping.  The camera will be used to take images during the procedure.  A small tissue sample may be removed from your body to be examined under a microscope (biopsy). If any potential problems are found, the tissue will be sent to a lab for testing.  If small polyps are found, your health  care provider may remove them and have them checked for cancer cells.  The tube that was inserted into your anus will be slowly removed. The procedure may vary among health care providers and hospitals. What happens after the procedure?  Your blood pressure, heart rate, breathing rate, and blood oxygen level will be monitored until the medicines you were given have worn off.  Do not drive for 24 hours after the exam.  You may have a small amount of blood in your stool.  You may pass gas and have mild abdominal cramping or bloating due to the air that was used to inflate your colon during the exam.  It is up to you to get the results of your procedure. Ask your health care provider, or the department performing the procedure, when your results will be ready. This information is not intended to replace advice given to you by your health care provider. Make sure you discuss any questions you have with your health care provider. Document Released: 06/28/2000 Document Revised: 05/01/2016 Document Reviewed: 09/12/2015 [employer] Interactive Patient Education  2017 [employer]  

## 2016-09-11 NOTE — Progress Notes (Signed)
Patient ID: Danielle [name], female   DOB: 08-12-1960, 56 y.o.   MRN: [id number]  Chief Complaint  Patient presents with  . Colonoscopy    HPI Danielle [name] is a 56 y.o. female Who presents for a colonoscopy discussion. The last colonoscopy was completed on 09/24/2005.  Over the past 2 years she's had intermittent difficulty with bright red rectal bleeding. Bowels move regular and no bleeding noted. She states she has had some anal bleeding and noticed some blood on the paper and on the bowel movement. Lots of itching evident in the perineum.. Patient states sometimes it is painful when she moves her bowels.  She has try using OTC cream which has not helped.   HPI  Past Medical History:  Diagnosis Date  . Anxiety   . Arthritis   . Asthma   . Bell's palsy   . Bursitis   . COPD (chronic obstructive pulmonary disease) ([place])   . Depression   . Fibromyalgia   . Heart murmur   . Hepatitis C   . Hiatal hernia   . Hyperlipidemia   . Hypertension   . IBS (irritable bowel syndrome)   . Insomnia   . Thyroid disease     Past Surgical History:  Procedure Laterality Date  . BACK SURGERY  2013   lumbar disk  . CARPAL TUNNEL RELEASE Right   . ELBOW SURGERY Left   . FOOT SURGERY Right   . necksurgery  10/21/2014   spine neck fusion  . NOSE SURGERY    . PARTIAL HYSTERECTOMY    . TOTAL SHOULDER REPLACEMENT Bilateral     Family History  Problem Relation Age of Onset  . Gout Mother   . Cancer Mother   . Heart disease Father     Social History Social History  Substance Use Topics  . Smoking status: Current Every Day Smoker    Packs/day: 1.00    Years: 40.00    Types: Cigarettes  . Smokeless tobacco: Never Used  . Alcohol use No     Comment: rarely    Allergies  Allergen Reactions  . Aripiprazole Swelling  . Duloxetine Swelling    Other reaction(s): Other (See Comments) Clogged her up - constipation, feet swelling.  . Gabapentin Swelling    Feet swell.  Girtha Rm Hives  and Itching    Itching 45 minutes after taking it & then broke out in hives. Was put on Prednisone. Treated for 5 months.  . Iodinated Diagnostic Agents Anaphylaxis    Other reaction(s): Other (See Comments) Had myelogram, given IV dye, had no heart beat, no pulse.  . Naproxen Sodium     Other reaction(s): Other (See Comments) GI bleeding.  . Nortriptyline Hcl Swelling    Other reaction(s): Other (See Comments) Made her hoarse, throat swelling when she took 100 mg.  . Nsaids     Other reaction(s): Other (See Comments) GI bleed.  . Pregabalin Swelling    Feet swelling.  . Ace Inhibitors     Other reaction(s): Other (See Comments) Unknown  . Aspirin     Other reaction(s): Other (See Comments) Bothers her stomach.  . Cefpodoxime     Other reaction(s): Other (See Comments) Bad thrush.  . Fluoxetine Diarrhea    Constant runs.  . Fluticasone-Salmeterol Other (See Comments)    Blisters in mouth Other reaction(s): Other (See Comments) Blisters in mouth  . Levofloxacin     Other reaction(s): Other (See Comments) Unknown, probably stomach issue.  Marland Kitchen  Lithium     Other reaction(s): Other (See Comments) Made her stare at the walls & cry.  . Meperidine     Other reaction(s): Other (See Comments) Same as Demerol - made her break out in patches all over her body. Other reaction(s): Other (See Comments) Breaks out in patches all over her body.  . Paroxetine     Other reaction(s): Other (See Comments) Unknown  . Tape     Other reaction(s): Other (See Comments) Skin peels off  . Telithromycin     Other reaction(s): Other (See Comments) Blisters in mouth plus thrush.  . Theophylline     Other reaction(s): Other (See Comments) Unknown  . Topiramate Other (See Comments)    Vomiting and increased depression Other reaction(s): Other (See Comments) Vomiting and increased depression  . Trazodone     Other reaction(s): Other (See Comments) Blisters lips very bad.  . Triamcinolone      Other reaction(s): Other (See Comments) Mouth blistered down her throat even if she rinsed her mouth.  . Venlafaxine Diarrhea    Took for years & did fine. Dropped electrolytes because it caused a lot of diarrhea.  . Bupropion Rash  . Ketorolac Tromethamine Nausea And Vomiting    Other reaction(s): Other (See Comments) Puking & headache  . Morphine Nausea And Vomiting    Intermittent nausea & vomiting with pills, but could tolerate the liquid form.  . Moxifloxacin Nausea And Vomiting    Other reaction(s): Other (See Comments) Bad thrush.    Current Outpatient Prescriptions  Medication Sig Dispense Refill  . albuterol (PROVENTIL HFA;VENTOLIN HFA) 108 (90 Base) MCG/ACT inhaler Inhale 2 puffs into the lungs every 4 (four) hours as needed for wheezing or shortness of breath.     . cetirizine (ZYRTEC) 10 MG tablet Take 10 mg by mouth daily.     . Cholecalciferol (VITAMIN D3) 2000 units capsule Take 2,000 Units by mouth daily.     . citalopram (CELEXA) 40 MG tablet Take 40 mg by mouth daily.  3  . cyanocobalamin (,VITAMIN B-12,) 1000 MCG/ML injection Inject into the muscle.    . docusate sodium (COLACE) 100 MG capsule Take 100 mg by mouth 2 (two) times daily.    Marland Kitchen levothyroxine (SYNTHROID, LEVOTHROID) 50 MCG tablet TAKE 1 TABLET (50 MCG TOTAL) BY MOUTH DAILY AT 0600.  11  . [START ON 10/01/2016] Oxycodone HCl 10 MG TABS Take 1 tablet (10 mg total) by mouth every 6 (six) hours as needed. 120 tablet 0  . SPIRIVA HANDIHALER 18 MCG inhalation capsule PLACE 1 CAPSULE (18 MCG TOTAL) INTO INHALER AND INHALE ONCE DAILY.  5  . cyclobenzaprine (FLEXERIL) 10 MG tablet Take 1 tablet (10 mg total) by mouth 3 (three) times daily as needed for muscle spasms. 90 tablet 2  . diclofenac sodium (VOLTAREN) 1 % GEL Apply 2 g topically 4 (four) times daily.     . hydrocortisone-pramoxine (ANALPRAM HC) 2.5-1 % rectal cream Place 1 application rectally 2 (two) times daily. 30 g 0  . magnesium hydroxide (MILK OF  MAGNESIA) 400 MG/5ML suspension Take by mouth.    . naloxone (NARCAN) 2 MG/2ML injection Inject content of syringe into thigh muscle. Call 911. 2 Syringe 1  . nystatin (MYCOSTATIN/NYSTOP) powder Apply topically.    . polyethylene glycol powder (GLYCOLAX/MIRALAX) powder 255 grams one bottle for colonoscopy prep 255 g 0   No current facility-administered medications for this visit.     Review of Systems Review of Systems  Constitutional:  Negative.   Respiratory: Negative.   Cardiovascular: Negative.   Gastrointestinal: Positive for anal bleeding and rectal pain.    Blood pressure 138/78, pulse 74, resp. rate 16, height 5\' 5"  (1.651 m), weight 253 lb (114.8 kg).  Physical Exam Physical Exam  Constitutional: She is oriented to person, place, and time. She appears well-developed and well-nourished.  Eyes: Conjunctivae are normal. No scleral icterus.  Neck: Neck supple.  Cardiovascular: Normal rate, regular rhythm and normal heart sounds.   Pulmonary/Chest: Effort normal and breath sounds normal.  Abdominal: Soft. Bowel sounds are normal. There is no tenderness.  Genitourinary:     Lymphadenopathy:    She has no cervical adenopathy.  Neurological: She is alert and oriented to person, place, and time.  Skin: Skin is warm and dry.    Data Reviewed 09/24/2005 colonoscopy showed a small polyp 5 mm rectal polyp. Pathology showed this was to hyperplastic.  Anoscopy: Normal visualized lower rectal mucosa. Minimal internal hemorrhoids. No bleeding. No stool for Hemoccult.  Assessment    Intermittent rectal bleeding, likely anorectal source.  Candidate for screening colonoscopy.     Plan           Colonoscopy with possible biopsy/polypectomy prn: Information regarding the procedure, including its potential risks and complications (including but not limited to perforation of the bowel, which may require emergency surgery to repair, and bleeding) was verbally given to the  patient. Educational information regarding lower intestinal endoscopy was given to the patient. Written instructions for how to complete the bowel prep using Miralax were provided. The importance of drinking ample fluids to avoid dehydration as a result of the prep emphasized. The patient will be encouraged to make use of a daily fiber supplement.    Trial of Analpram for perianal discomfort sent to Pharmacy.  Patient has been scheduled for a colonoscopy on 11-13-16 at [facility].   Due to her history of constipation, minimally changed since the initiation of long-term opioid use, will extend her prep by 24 hours. She will make use of dulcolax 5 mg tablets- 2 tabs the morning and afternoon day prior to prep in addition to regular Miralax prep.   As the patient has had bilateral shoulder replacement, she will be asked to make use of amoxicillin, 2 g one hour prior to procedure.   This information has been scribed by Danielle [name] CMA.   Danielle [name] 09/12/2016, 11:52 AM

## 2016-09-12 ENCOUNTER — Telehealth: Payer: Self-pay

## 2016-09-12 DIAGNOSIS — Z1211 Encounter for screening for malignant neoplasm of colon: Secondary | ICD-10-CM | POA: Insufficient documentation

## 2016-09-12 DIAGNOSIS — K625 Hemorrhage of anus and rectum: Secondary | ICD-10-CM | POA: Insufficient documentation

## 2016-09-12 MED ORDER — AMOXICILLIN 500 MG PO TABS: 2000.0000 mg | ORAL_TABLET | Freq: Once | ORAL | 0 refills | Status: AC

## 2016-09-12 NOTE — Telephone Encounter (Signed)
-----   Message from Robert Bellow, MD sent at 09/12/2016 12:11 PM EST ----- [facility] notify the patient a prescription for amoxicillin, 4-500 mg tablets/capsules has been sent for pharmacy. She should take this one hour prior to planned presentation for her colonoscopy.

## 2016-09-13 NOTE — Telephone Encounter (Signed)
Notified patient as instructed, patient pleased. Discussed follow-up appointments, patient agrees  

## 2016-09-16 ENCOUNTER — Ambulatory Visit: Payer: Medicaid Other | Admitting: Dietician

## 2016-09-18 ENCOUNTER — Ambulatory Visit: Payer: Medicaid Other | Admitting: Pain Medicine

## 2016-09-19 ENCOUNTER — Ambulatory Visit
Admission: RE | Admit: 2016-09-19 | Discharge: 2016-09-19 | Disposition: A | Discharge status: Home / Self Care | Payer: Medicaid Other | Source: Ambulatory Visit | Attending: Pain Medicine | Admitting: Pain Medicine

## 2016-09-19 DIAGNOSIS — M542 Cervicalgia: Principal | ICD-10-CM

## 2016-09-19 DIAGNOSIS — G8929 Other chronic pain: Secondary | ICD-10-CM | POA: Insufficient documentation

## 2016-09-19 DIAGNOSIS — Z981 Arthrodesis status: Secondary | ICD-10-CM | POA: Insufficient documentation

## 2016-09-30 ENCOUNTER — Ambulatory Visit (HOSPITAL_BASED_OUTPATIENT_CLINIC_OR_DEPARTMENT_OTHER): Payer: Medicaid Other | Admitting: Pain Medicine

## 2016-09-30 ENCOUNTER — Ambulatory Visit
Admission: RE | Admit: 2016-09-30 | Discharge: 2016-09-30 | Disposition: A | Discharge status: Home / Self Care | Payer: Medicaid Other | Source: Ambulatory Visit | Attending: Pain Medicine | Admitting: Pain Medicine

## 2016-09-30 ENCOUNTER — Encounter: Payer: Self-pay | Admitting: Pain Medicine

## 2016-09-30 VITALS — BP 117/74 | HR 98 | Temp 98.3°F | Resp 18 | Ht 65.0 in | Wt 246.0 lb

## 2016-09-30 DIAGNOSIS — M545 Low back pain: Secondary | ICD-10-CM | POA: Diagnosis not present

## 2016-09-30 DIAGNOSIS — Z96612 Presence of left artificial shoulder joint: Secondary | ICD-10-CM | POA: Diagnosis not present

## 2016-09-30 DIAGNOSIS — M542 Cervicalgia: Secondary | ICD-10-CM | POA: Diagnosis present

## 2016-09-30 DIAGNOSIS — G8929 Other chronic pain: Secondary | ICD-10-CM | POA: Insufficient documentation

## 2016-09-30 DIAGNOSIS — M47812 Spondylosis without myelopathy or radiculopathy, cervical region: Secondary | ICD-10-CM | POA: Insufficient documentation

## 2016-09-30 DIAGNOSIS — M25551 Pain in right hip: Secondary | ICD-10-CM | POA: Insufficient documentation

## 2016-09-30 DIAGNOSIS — Z96611 Presence of right artificial shoulder joint: Secondary | ICD-10-CM | POA: Diagnosis not present

## 2016-09-30 DIAGNOSIS — M1288 Other specific arthropathies, not elsewhere classified, other specified site: Secondary | ICD-10-CM | POA: Diagnosis not present

## 2016-09-30 DIAGNOSIS — G5602 Carpal tunnel syndrome, left upper limb: Secondary | ICD-10-CM | POA: Diagnosis not present

## 2016-09-30 MED ORDER — LIDOCAINE HCL (PF) 1 % IJ SOLN
10.0000 mL | Freq: Once | INTRAMUSCULAR | Status: DC
  Filled 2016-09-30: qty 10

## 2016-09-30 MED ORDER — FENTANYL CITRATE (PF) 100 MCG/2ML IJ SOLN
25.0000 ug | INTRAMUSCULAR | Status: DC | PRN
  Administered 2016-09-30: 100 ug via INTRAVENOUS
  Filled 2016-09-30: qty 2

## 2016-09-30 MED ORDER — LACTATED RINGERS IV SOLN
1000.0000 mL | Freq: Once | INTRAVENOUS | Status: AC
  Administered 2016-09-30: 1000 mL via INTRAVENOUS

## 2016-09-30 MED ORDER — MIDAZOLAM HCL 5 MG/5ML IJ SOLN
1.0000 mg | INTRAMUSCULAR | Status: DC | PRN
  Administered 2016-09-30: 3 mg via INTRAVENOUS
  Filled 2016-09-30: qty 5

## 2016-09-30 MED ORDER — ROPIVACAINE HCL 5 MG/ML IJ SOLN
5.0000 mL | Freq: Once | INTRAMUSCULAR | Status: AC
  Administered 2016-09-30: 20 mL via PERINEURAL
  Filled 2016-09-30: qty 20

## 2016-09-30 MED ORDER — DEXAMETHASONE SODIUM PHOSPHATE 4 MG/ML IJ SOLN
10.0000 mg | Freq: Once | INTRAMUSCULAR | Status: AC
  Administered 2016-09-30: 4 mg
  Filled 2016-09-30: qty 3

## 2016-09-30 NOTE — Patient Instructions (Addendum)

## 2016-09-30 NOTE — Progress Notes (Signed)
Safety precautions to be maintained throughout the outpatient stay will include: orient to surroundings, keep bed in low position, maintain call bell within reach at all times, provide assistance with transfer out of bed and ambulation.  

## 2016-09-30 NOTE — Progress Notes (Signed)
Patient's Name: Danielle [name]  MRN: [id number]  Referring Provider: Milinda Pointer, MD  DOB: [birth year]  PCP: Marygrace Drought, MD  DOS: 09/30/2016  Note by: Kathlen Brunswick. [name], MD  Service setting: Ambulatory outpatient  Location: [facility] (AMB) Pain Management Facility  Visit type: Procedure  Specialty: Interventional Pain Management  Patient type: Established   Primary Reason for Visit: Interventional Pain Management Treatment. CC: Neck Pain; Hip Pain (right); and Back Pain (low)  Procedure:  Anesthesia, Analgesia, Anxiolysis:  Type: Diagnostic Cervical Facet Medial Branch Block(s) Region: Posterolateral cervical spine region Level: C3, C4, C5, C6, & C7 Medial Branch Level(s) Laterality: Bilateral Paraspinal  Type: Local Anesthesia with Moderate (Conscious) Sedation Local Anesthetic: Lidocaine 1% Route: Intravenous (IV) IV Access: Secured Sedation: Meaningful verbal contact was maintained at all times during the procedure  Indication(s): Analgesia and Anxiety  Indications: 1. Cervical facet syndrome   2. Spondylosis of cervical region without myelopathy or radiculopathy   3. Chronic neck pain (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)    Pain Score: Pre-procedure: 4 /10 Post-procedure: 1 /10 (she refers still having a little bit of pain on the left side, at the very top, behind the mastoid process.)  Pre-op Assessment:  Previous date of service: 09/05/16 Service provided: Evaluation Danielle [name] is a 56 y.o. (year old), female patient, seen today for interventional treatment. She  has a past surgical history that includes necksurgery (10/21/2014); Back surgery (2013); Foot surgery (Right); Elbow surgery (Left); Carpal tunnel release (Right); Nose surgery; Partial hysterectomy; and Total shoulder replacement (Bilateral). Her primarily concern today is the Neck Pain; Hip Pain (right); and Back Pain (low)  Initial Vital Signs: Blood pressure 131/82, pulse (!) 104, temperature 98.3 F (36.8 C), resp. rate  16, height 5\' 5"  (1.651 m), weight 246 lb (111.6 kg), SpO2 95 %. BMI: 40.94 kg/m  Risk Assessment: Allergies: Reviewed. She is allergic to aripiprazole; duloxetine; gabapentin; gold; iodinated diagnostic agents; naproxen sodium; nortriptyline hcl; nsaids; pregabalin; ace inhibitors; aspirin; cefpodoxime; fluoxetine; fluticasone-salmeterol; levofloxacin; lithium; meperidine; paroxetine; tape; telithromycin; theophylline; topiramate; trazodone; triamcinolone; venlafaxine; bupropion; ketorolac tromethamine; morphine; and moxifloxacin.  Allergy Precautions: None required Coagulopathies: "Reviewed. None identified.  Blood-thinner therapy: None at this time Active Infection(s): Reviewed. None identified. Danielle [name] is [name]  Site Confirmation: Danielle [name] was asked to confirm the procedure and laterality before marking the site Procedure checklist: Completed Consent: Before the procedure and under the influence of no sedative(s), amnesic(s), or anxiolytics, the patient was informed of the treatment options, risks and possible complications. To fulfill our ethical and legal obligations, as recommended by the American Medical Association's Code of Ethics, I have informed the patient of my clinical impression; the nature and purpose of the treatment or procedure; the risks, benefits, and possible complications of the intervention; the alternatives, including doing nothing; the risk(s) and benefit(s) of the alternative treatment(s) or procedure(s); and the risk(s) and benefit(s) of doing nothing. The patient was provided information about the general risks and possible complications associated with the procedure. These may include, but are not limited to: failure to achieve desired goals, infection, bleeding, organ or nerve damage, allergic reactions, paralysis, and death. In addition, the patient was informed of those risks and complications associated to Spine-related procedures, such as failure to  decrease pain; infection (i.e.: Meningitis, epidural or intraspinal abscess); bleeding (i.e.: epidural hematoma, subarachnoid hemorrhage, or any other type of intraspinal or peri-dural bleeding); organ or nerve damage (i.e.: Any type of peripheral nerve, nerve root, or spinal cord injury) with subsequent damage to sensory, motor,  and/or autonomic systems, resulting in permanent pain, numbness, and/or weakness of one or several areas of the body; allergic reactions; (i.e.: anaphylactic reaction); and/or death. Furthermore, the patient was informed of those risks and complications associated with the medications. These include, but are not limited to: allergic reactions (i.e.: anaphylactic or anaphylactoid reaction(s)); adrenal axis suppression; blood sugar elevation that in diabetics may result in ketoacidosis or comma; water retention that in patients with history of congestive heart failure may result in shortness of breath, pulmonary edema, and decompensation with resultant heart failure; weight gain; swelling or edema; medication-induced neural toxicity; particulate matter embolism and blood vessel occlusion with resultant organ, and/or nervous system infarction; and/or aseptic necrosis of one or more joints. Finally, the patient was informed that Medicine is not an exact science; therefore, there is also the possibility of unforeseen or unpredictable risks and/or possible complications that may result in a catastrophic outcome. The patient indicated having understood very clearly. We have given the patient no guarantees and we have made no promises. Enough time was given to the patient to ask questions, all of which were answered to the patient's satisfaction. Danielle [name] has indicated that she wanted to continue with the procedure. Attestation: I, the ordering provider, attest that I have discussed with the patient the benefits, risks, side-effects, alternatives, likelihood of achieving goals, and potential  problems during recovery for the procedure that I have provided informed consent. Date: 09/30/2016; Time: 10:08 AM  Pre-Procedure Preparation:  Monitoring: As per clinic protocol. Respiration, ETCO2, SpO2, BP, heart rate and rhythm monitor placed and checked for adequate function Safety Precautions: Patient was assessed for positional comfort and pressure points before starting the procedure. Time-out: I initiated and conducted the "Time-out" before starting the procedure, as per protocol. The patient was asked to participate by confirming the accuracy of the "Time Out" information. Verification of the correct person, site, and procedure were performed and confirmed by me, the nursing staff, and the patient. "Time-out" conducted as per Joint Commission's Universal Protocol (UP.01.01.01). "Time-out" Date & Time: 09/30/2016; 1126 hrs.  Description of Procedure Process:   Position: Prone with head of the table was raised to facilitate breathing. Target Area: For Cervical Facet blocks, the target is the postero-lateral waist of the articular pillars at the C3, C4, C5, C6, & C7 levels. Approach: Posterior approach. Area Prepped: Entire Posterior Cervico-thoracic Region Prepping solution: ChloraPrep (2% chlorhexidine gluconate and 70% isopropyl alcohol) Safety Precautions: Aspiration looking for blood return was conducted prior to all injections. At no point did we inject any substances, as a needle was being advanced. No attempts were made at seeking any paresthesias. Safe injection practices and needle disposal techniques used. Medications properly checked for expiration dates. SDV (single dose vial) medications used. Description of the Procedure: Protocol guidelines were followed. The patient was placed in position over the fluoroscopy table. The target area was identified and the area prepped in the usual manner. Skin desensitized using vapocoolant spray. Skin & deeper tissues infiltrated with local  anesthetic. Appropriate amount of time allowed to pass for local anesthetics to take effect. The procedure needle was introduced through the skin, ipsilateral to the reported pain, and advanced to the target area. Bone was contacted on the posterior aspect of the articular pillars and the needle walked lateral, until the border was cleared. Lateral views taken to make sure the needle tip did not advance past the posterior third of the lateral mass of the posterior columns. The procedure was repeated in identical fashion  for each level. Negative aspiration confirmed. Solution injected in intermittent fashion, asking for systemic symptoms every 0.5cc of injectate. The needles were then removed and the area cleansed, making sure to leave some of the prepping solution back to take advantage of its long term bactericidal properties. Vitals:   09/30/16 1140 09/30/16 1147 09/30/16 1157 09/30/16 1210  BP: (!) 157/88 109/78 122/64 117/74  Pulse: 94 83 81 98  Resp: 13 (!) 21 13 18   Temp:      SpO2: 90% 95% 93% 92%  Weight:      Height:        Start Time: 1127 hrs. End Time: 1140 hrs. Materials:  Needle(s) Type: Regular needle Gauge: 22G Length: 3.5-in Medication(s): We administered lactated ringers, midazolam, fentaNYL, dexamethasone, dexamethasone, and ropivacaine (PF) 5 mg/mL (0.5%). Please see chart orders for dosing details.  Imaging Guidance (Spinal):  Type of Imaging Technique: Fluoroscopy Guidance (Spinal) Indication(s): Assistance in needle guidance and placement for procedures requiring needle placement in or near specific anatomical locations not easily accessible without such assistance. Exposure Time: Please see nurses notes. Contrast: None used. Fluoroscopic Guidance: I was personally present during the use of fluoroscopy. "Tunnel Vision Technique" used to obtain the best possible view of the target area. Parallax error corrected before commencing the procedure. "Direction-depth-direction"  technique used to introduce the needle under continuous pulsed fluoroscopy. Once target was reached, antero-posterior, oblique, and lateral fluoroscopic projection used confirm needle placement in all planes. Images permanently stored in EMR. Interpretation: No contrast injected. I personally interpreted the imaging intraoperatively. Adequate needle placement confirmed in multiple planes. Permanent images saved into the patient's record.  Antibiotic Prophylaxis:  Indication(s): None identified Antibiotic given: None  Post-operative Assessment:  EBL: None Complications: No immediate post-treatment complications observed by team, or reported by patient. Note: The patient tolerated the entire procedure well. A repeat set of vitals were taken after the procedure and the patient was kept under observation following institutional policy, for this type of procedure. Post-procedural neurological assessment was performed, showing return to baseline, prior to discharge. The patient was provided with post-procedure discharge instructions, including a section on how to identify potential problems. Should any problems arise concerning this procedure, the patient was given instructions to immediately contact us, at any time, without hesitation. In any case, we plan to contact the patient by telephone for a follow-up status report regarding this interventional procedure. Comments:  No additional relevant information.  Plan of Care  Disposition: Discharge home  Discharge Date & Time: 09/30/2016; 1214 hrs.  Physician-requested Follow-up:  Return in about 2 weeks (around 10/14/2016) for Post-Procedure evaluation.  Future Appointments Date Time Provider [place]  10/01/2016 2:00 PM Gloriajean Dell, RD [facility] None  10/24/2016 1:00 PM Milinda Pointer, MD [facility] None   Medications ordered for procedure: Meds ordered this encounter  Medications  . lactated ringers infusion 1,000 mL  . midazolam  (VERSED) 5 MG/5ML injection 1-2 mg    Make sure Flumazenil is available in the pyxis when using this medication. If oversedation occurs, administer 0.2 mg IV over 15 sec. If after 45 sec no response, administer 0.2 mg again over 1 min; may repeat at 1 min intervals; not to exceed 4 doses (1 mg)  . fentaNYL (SUBLIMAZE) injection 25-50 mcg    Make sure Narcan is available in the pyxis when using this medication. In the event of respiratory depression (RR< 8/min): Titrate NARCAN (naloxone) in increments of 0.1 to 0.2 mg IV at 2-3 minute intervals, until  desired degree of reversal.  . dexamethasone (DECADRON) injection 10 mg  . lidocaine (PF) (XYLOCAINE) 1 % injection 10 mL  . dexamethasone (DECADRON) injection 10 mg  . lidocaine (PF) (XYLOCAINE) 1 % injection 10 mL  . ropivacaine (PF) 5 mg/mL (0.5%) (NAROPIN) injection 5 mL    Preservative-free (MPF), single use vial.   Medications administered: We administered lactated ringers, midazolam, fentaNYL, dexamethasone, dexamethasone, and ropivacaine (PF) 5 mg/mL (0.5%).  See the medical record for exact dosing, route, and time of administration.  Lab-work, Procedure(s), & Referral(s) Ordered: Orders Placed This Encounter  Procedures  . DG C-Arm 1-60 Min-No Report  . Discharge instructions  . Follow-up  . Informed Consent Details: Transcribe to consent form and obtain patient signature  . Provider attestation of informed consent for procedure/surgical case  . Verify informed consent   Imaging Ordered: Results for orders placed in visit on 08/05/16  DG C-Arm 1-60 Min-No Report   Narrative There is no Radiologist interpretation  for this exam.   New Prescriptions   No medications on file   Primary Care Physician: Marygrace Drought, MD Location: [facility] Note by: Kathlen Brunswick. [name], M.D, DABA, DABAPM, DABPM, DABIPP, FIPP Date: 09/30/2016; Time: 12:46 PM  Disclaimer:  Medicine is not an exact science. The only  guarantee in medicine is that nothing is guaranteed. It is important to note that the decision to proceed with this intervention was based on the information collected from the patient. The Data and conclusions were drawn from the patient's questionnaire, the interview, and the physical examination. Because the information was provided in large part by the patient, it cannot be guaranteed that it has not been purposely or unconsciously manipulated. Every effort has been made to obtain as much relevant data as possible for this evaluation. It is important to note that the conclusions that lead to this procedure are derived in large part from the available data. Always take into account that the treatment will also be dependent on availability of resources and existing treatment guidelines, considered by other Pain Management Practitioners as being common knowledge and practice, at the time of the intervention. For Medico-Legal purposes, it is also important to point out that variation in procedural techniques and pharmacological choices are the acceptable norm. The indications, contraindications, technique, and results of the above procedure should only be interpreted and judged by a Board-Certified Interventional Pain Specialist with extensive familiarity and expertise in the same exact procedure and technique. Attempts at providing opinions without similar or greater experience and expertise than that of the treating physician will be considered as inappropriate and unethical, and shall result in a formal complaint to the state medical board and applicable specialty societies.  Instructions provided at this appointment: Patient Instructions  Post-Procedure instructions Instructions:  Apply ice: Fill a plastic sandwich bag with crushed ice. Cover it with a small towel and apply to injection site. Apply for 15 minutes then remove x 15 minutes. Repeat sequence on day of procedure, until you go to bed. The  purpose is to minimize swelling and discomfort after procedure.  Apply heat: Apply heat to procedure site starting the day following the procedure. The purpose is to treat any soreness and discomfort from the procedure.  Food intake: Start with clear liquids (like water) and advance to regular food, as tolerated.   Physical activities: Keep activities to a minimum for the first 8 hours after the procedure.   Driving: If you have received any sedation, you are  not allowed to drive for 24 hours after your procedure.  Blood thinner: Restart your blood thinner 6 hours after your procedure. (Only for those taking blood thinners)  Insulin: As soon as you can eat, you may resume your normal dosing schedule. (Only for those taking insulin)  Infection prevention: Keep procedure site clean and dry.  Post-procedure Pain Diary: Extremely important that this be done correctly and accurately. Recorded information will be used to determine the next step in treatment.  Pain evaluated is that of treated area only. Do not include pain from an untreated area.  Complete every hour, on the hour, for the initial 8 hours. Set an alarm to help you do this part accurately.  Do not go to sleep and have it completed later. It will not be accurate.  Follow-up appointment: Keep your follow-up appointment after the procedure. Usually 2 weeks for most procedures. (6 weeks in the case of radiofrequency.) Bring you pain diary.  Expect:  From numbing medicine (AKA: Local Anesthetics): Numbness or decrease in pain.  Onset: Full effect within 15 minutes of injected.  Duration: It will depend on the type of local anesthetic used. On the average, 1 to 8 hours.   From steroids: Decrease in swelling or inflammation. Once inflammation is improved, relief of the pain will follow.  Onset of benefits: Depends on the amount of swelling present. The more swelling, the longer it will take for the benefits to be seen.    Duration: Steroids will stay in the system x 2 weeks. Duration of benefits will depend on multiple posibilities including persistent irritating factors.  From procedure: Some discomfort is to be expected once the numbing medicine wears off. This should be minimal if ice and heat are applied as instructed. Call if:  You experience numbness and weakness that gets worse with time, as opposed to wearing off.  New onset bowel or bladder incontinence. (Spinal procedures only)  Emergency Numbers:  Durning business hours (Monday - Thursday, 8:00 AM - 4:00 PM) (Friday, 9:00 AM - 12:00 Noon): [phone]  After hours: [phone]   __________________________________________________________________________________________   Pain Management Discharge Instructions  General Discharge Instructions :  If you need to reach your doctor call: Monday-Friday 8:00 am - 4:00 pm at [phone] or toll free [phone].  After clinic hours [phone] to have operator reach doctor.  Bring all of your medication bottles to all your appointments in the pain clinic.  To cancel or reschedule your appointment with Pain Management please remember to call 24 hours in advance to avoid a fee.  Refer to the educational materials which you have been given on: General Risks, I had my Procedure. Discharge Instructions, Post Sedation.  Post Procedure Instructions:  The drugs you were given will stay in your system until tomorrow, so for the next 24 hours you should not drive, make any legal decisions or drink any alcoholic beverages.  You may eat anything you prefer, but it is better to start with liquids then soups and crackers, and gradually work up to solid foods.  Please notify your doctor immediately if you have any unusual bleeding, trouble breathing or pain that is not related to your normal pain.  Depending on the type of procedure that was done, some parts of your body may feel week and/or  numb.  This usually clears up by tonight or the next day.  Walk with the use of an assistive device or accompanied by an adult for the 24 hours.  You  may use ice on the affected area for the first 24 hours.  Put ice in a Ziploc bag and cover with a towel and place against area 15 minutes on 15 minutes off.  You may switch to heat after 24 hours.

## 2016-10-01 ENCOUNTER — Ambulatory Visit: Payer: Medicaid Other | Admitting: Dietician

## 2016-10-01 ENCOUNTER — Telehealth: Payer: Self-pay | Admitting: *Deleted

## 2016-10-01 NOTE — Telephone Encounter (Signed)
Left voicemail for patient to return call for any problems post procedure.

## 2016-10-24 ENCOUNTER — Encounter: Payer: Self-pay | Admitting: Pain Medicine

## 2016-10-24 ENCOUNTER — Ambulatory Visit: Payer: Medicaid Other | Attending: Pain Medicine | Admitting: Pain Medicine

## 2016-10-24 VITALS — BP 122/77 | HR 94 | Temp 98.2°F | Resp 16 | Ht 65.0 in | Wt 240.0 lb

## 2016-10-24 DIAGNOSIS — M47892 Other spondylosis, cervical region: Secondary | ICD-10-CM | POA: Diagnosis not present

## 2016-10-24 DIAGNOSIS — E079 Disorder of thyroid, unspecified: Secondary | ICD-10-CM | POA: Insufficient documentation

## 2016-10-24 DIAGNOSIS — F329 Major depressive disorder, single episode, unspecified: Secondary | ICD-10-CM | POA: Insufficient documentation

## 2016-10-24 DIAGNOSIS — R531 Weakness: Secondary | ICD-10-CM | POA: Diagnosis not present

## 2016-10-24 DIAGNOSIS — Z79891 Long term (current) use of opiate analgesic: Secondary | ICD-10-CM

## 2016-10-24 DIAGNOSIS — M25559 Pain in unspecified hip: Secondary | ICD-10-CM

## 2016-10-24 DIAGNOSIS — G9520 Unspecified cord compression: Secondary | ICD-10-CM | POA: Insufficient documentation

## 2016-10-24 DIAGNOSIS — R200 Anesthesia of skin: Secondary | ICD-10-CM | POA: Diagnosis present

## 2016-10-24 DIAGNOSIS — K449 Diaphragmatic hernia without obstruction or gangrene: Secondary | ICD-10-CM | POA: Diagnosis not present

## 2016-10-24 DIAGNOSIS — E039 Hypothyroidism, unspecified: Secondary | ICD-10-CM | POA: Insufficient documentation

## 2016-10-24 DIAGNOSIS — Z1211 Encounter for screening for malignant neoplasm of colon: Secondary | ICD-10-CM | POA: Insufficient documentation

## 2016-10-24 DIAGNOSIS — Z720 Tobacco use: Secondary | ICD-10-CM | POA: Diagnosis not present

## 2016-10-24 DIAGNOSIS — Z6841 Body Mass Index (BMI) 40.0 and over, adult: Secondary | ICD-10-CM | POA: Diagnosis not present

## 2016-10-24 DIAGNOSIS — M4696 Unspecified inflammatory spondylopathy, lumbar region: Secondary | ICD-10-CM

## 2016-10-24 DIAGNOSIS — K625 Hemorrhage of anus and rectum: Secondary | ICD-10-CM | POA: Diagnosis not present

## 2016-10-24 DIAGNOSIS — M79604 Pain in right leg: Secondary | ICD-10-CM | POA: Insufficient documentation

## 2016-10-24 DIAGNOSIS — G894 Chronic pain syndrome: Secondary | ICD-10-CM | POA: Diagnosis not present

## 2016-10-24 DIAGNOSIS — M25551 Pain in right hip: Secondary | ICD-10-CM | POA: Insufficient documentation

## 2016-10-24 DIAGNOSIS — M47896 Other spondylosis, lumbar region: Secondary | ICD-10-CM | POA: Insufficient documentation

## 2016-10-24 DIAGNOSIS — E538 Deficiency of other specified B group vitamins: Secondary | ICD-10-CM | POA: Diagnosis not present

## 2016-10-24 DIAGNOSIS — M533 Sacrococcygeal disorders, not elsewhere classified: Secondary | ICD-10-CM

## 2016-10-24 DIAGNOSIS — M5441 Lumbago with sciatica, right side: Secondary | ICD-10-CM

## 2016-10-24 DIAGNOSIS — F1190 Opioid use, unspecified, uncomplicated: Secondary | ICD-10-CM

## 2016-10-24 DIAGNOSIS — M545 Low back pain: Secondary | ICD-10-CM | POA: Diagnosis present

## 2016-10-24 DIAGNOSIS — J449 Chronic obstructive pulmonary disease, unspecified: Secondary | ICD-10-CM | POA: Insufficient documentation

## 2016-10-24 DIAGNOSIS — J302 Other seasonal allergic rhinitis: Secondary | ICD-10-CM | POA: Insufficient documentation

## 2016-10-24 DIAGNOSIS — M25511 Pain in right shoulder: Secondary | ICD-10-CM

## 2016-10-24 DIAGNOSIS — G8929 Other chronic pain: Secondary | ICD-10-CM

## 2016-10-24 DIAGNOSIS — M797 Fibromyalgia: Secondary | ICD-10-CM | POA: Insufficient documentation

## 2016-10-24 DIAGNOSIS — M961 Postlaminectomy syndrome, not elsewhere classified: Secondary | ICD-10-CM

## 2016-10-24 DIAGNOSIS — M4692 Unspecified inflammatory spondylopathy, cervical region: Secondary | ICD-10-CM

## 2016-10-24 DIAGNOSIS — M47812 Spondylosis without myelopathy or radiculopathy, cervical region: Secondary | ICD-10-CM

## 2016-10-24 DIAGNOSIS — M25512 Pain in left shoulder: Secondary | ICD-10-CM

## 2016-10-24 DIAGNOSIS — I10 Essential (primary) hypertension: Secondary | ICD-10-CM | POA: Insufficient documentation

## 2016-10-24 DIAGNOSIS — M47816 Spondylosis without myelopathy or radiculopathy, lumbar region: Secondary | ICD-10-CM

## 2016-10-24 DIAGNOSIS — M160 Bilateral primary osteoarthritis of hip: Secondary | ICD-10-CM | POA: Insufficient documentation

## 2016-10-24 MED ORDER — OXYCODONE HCL 10 MG PO TABS: 10.0000 mg | ORAL_TABLET | Freq: Four times a day (QID) | ORAL | 0 refills | Status: DC | PRN

## 2016-10-24 NOTE — Patient Instructions (Addendum)
Preparing for Procedure with Sedation Instructions: . Oral Intake: Do not eat or drink anything for at least 8 hours prior to your procedure. . Transportation: Public transportation is not allowed. Bring an adult driver. The driver must be physically present in our waiting room before any procedure can be started. . Physical Assistance: Bring an adult physically capable of assisting you, in the event you need help. This adult should keep you company at home for at least 6 hours after the procedure. . Blood Pressure Medicine: Take your blood pressure medicine with a sip of water the morning of the procedure. . Blood thinners:  . Diabetics on insulin: Notify the staff so that you can be scheduled 1st case in the morning. If your diabetes requires high dose insulin, take only  of your normal insulin dose the morning of the procedure and notify the staff that you have done so. . Preventing infections: Shower with an antibacterial soap the morning of your procedure. . Build-up your immune system: Take 1000 mg of Vitamin C with every meal (3 times a day) the day prior to your procedure. . Antibiotics: Inform the staff if you have a condition or reason that requires you to take antibiotics before dental procedures. . Pregnancy: If you are pregnant, call and cancel the procedure. . Sickness: If you have a cold, fever, or any active infections, call and cancel the procedure. . Arrival: You must be in the facility at least 30 minutes prior to your scheduled procedure. . Children: Do not bring children with you. . Dress appropriately: Bring dark clothing that you would not mind if they get stained. . Valuables: Do not bring any jewelry or valuables. Procedure appointments are reserved for interventional treatments only. . No Prescription Refills. . No medication changes will be discussed during procedure appointments. . No disability issues will be  discussed.  ____________________________________________________________________________________________   Facet Joint Block The facet joints connect the bones of the spine (vertebrae). They make it possible for you to bend, twist, and make other movements with your spine. They also keep you from bending too far, twisting too far, and making other excessive movements. A facet joint block is a procedure where a numbing medicine (anesthetic) is injected into a facet joint. Often, a type of anti-inflammatory medicine called a steroid is also injected. A facet joint block may be done to diagnose neck or back pain. If the pain gets better after a facet joint block, it means the pain is probably coming from the facet joint. If the pain does not get better, it means the pain is probably not coming from the facet joint. A facet joint block may also be done to relieve neck or back pain caused by an inflamed facet joint. A facet joint block is only done to relieve pain if the pain does not improve with other methods, such as medicine, exercise programs, and physical therapy. Tell a health care provider about:  Any allergies you have.  All medicines you are taking, including vitamins, herbs, eye drops, creams, and over-the-counter medicines.  Any problems you or family members have had with anesthetic medicines.  Any blood disorders you have.  Any surgeries you have had.  Any medical conditions you have.  Whether you are pregnant or may be pregnant. What are the risks? Generally, this is a safe procedure. However, problems may occur, including:  Bleeding.  Injury to a nerve near the injection site.  Pain at the injection site.  Weakness or numbness in   areas controlled by nerves near the injection site.  Infection.  Temporary fluid retention.  Allergic reactions to medicines or dyes.  Injury to other structures or organs near the injection site. What happens before the  procedure?  Follow instructions from your health care provider about eating or drinking restrictions.  Ask your health care provider about:  Changing or stopping your regular medicines. This is especially important if you are taking diabetes medicines or blood thinners.  Taking medicines such as aspirin and ibuprofen. These medicines can thin your blood. Do not take these medicines before your procedure if your health care provider instructs you not to.  Do not take any new dietary supplements or medicines without asking your health care provider first.  Plan to have someone take you home after the procedure. What happens during the procedure?  You may need to remove your clothing and dress in an open-back gown.  The procedure will be done while you are lying on an X-ray table. You will most likely be asked to lie on your stomach, but you may be asked to lie in a different position if an injection will be made in your neck.  Machines will be used to monitor your oxygen levels, heart rate, and blood pressure.  If an injection will be made in your neck, an IV tube will be inserted into one of your veins. Fluids and medicine will flow directly into your body through the IV tube.  The area over the facet joint where the injection will be made will be cleaned with soap. The surrounding skin will be covered with clean drapes.  A numbing medicine (local anesthetic) will be applied to your skin. Your skin may sting or burn for a moment.  A video X-ray machine (fluoroscopy) will be used to locate the joint. In some cases, a CT scan may be used.  A contrast dye may be injected into the facet joint area to help locate the joint.  When the joint is located, an anesthetic will be injected into the joint through the needle.  Your health care provider will ask you whether you feel pain relief. If you do feel relief, a steroid may be injected to provide pain relief for a longer period of time. If  you do not feel relief or feel only partial relief, additional injections of an anesthetic may be made in other facet joints.  The needle will be removed.  Your skin will be cleaned.  A bandage (dressing) will be applied over each injection site. The procedure may vary among health care providers and hospitals. What happens after the procedure?  You will be observed for 15-30 minutes before being allowed to go home. This information is not intended to replace advice given to you by your health care provider. Make sure you discuss any questions you have with your health care provider. Document Released: 11/20/2006 Document Revised: 08/02/2015 Document Reviewed: 03/27/2015 [employer] Interactive Patient Education  2017 [employer] GENERAL RISKS AND COMPLICATIONS  What are the risk, side effects and possible complications? Generally speaking, most procedures are safe.  However, with any procedure there are risks, side effects, and the possibility of complications.  The risks and complications are dependent upon the sites that are lesioned, or the type of nerve block to be performed.  The closer the procedure is to the spine, the more serious the risks are.  Great care is taken when placing the radio frequency needles, block needles or lesioning probes, but sometimes complications   can occur. 1. Infection: Any time there is an injection through the skin, there is a risk of infection.  This is why sterile conditions are used for these blocks.  There are four possible types of infection. 1. Localized skin infection. 2. Central Nervous System Infection-This can be in the form of Meningitis, which can be deadly. 3. Epidural Infections-This can be in the form of an epidural abscess, which can cause pressure inside of the spine, causing compression of the spinal cord with subsequent paralysis. This would require an emergency surgery to decompress, and there are no guarantees that the patient would recover  from the paralysis. 4. Discitis-This is an infection of the intervertebral discs.  It occurs in about 1% of discography procedures.  It is difficult to treat and it may lead to surgery.        2. Pain: the needles have to go through skin and soft tissues, will cause soreness.       3. Damage to internal structures:  The nerves to be lesioned may be near blood vessels or    other nerves which can be potentially damaged.       4. Bleeding: Bleeding is more common if the patient is taking blood thinners such as  aspirin, Coumadin, Ticiid, Plavix, etc., or if he/she have some genetic predisposition  such as hemophilia. Bleeding into the spinal canal can cause compression of the spinal  cord with subsequent paralysis.  This would require an emergency surgery to  decompress and there are no guarantees that the patient would recover from the  paralysis.       5. Pneumothorax:  Puncturing of a lung is a possibility, every time a needle is introduced in  the area of the chest or upper back.  Pneumothorax refers to free air around the  collapsed lung(s), inside of the thoracic cavity (chest cavity).  Another two possible  complications related to a similar event would include: Hemothorax and Chylothorax.   These are variations of the Pneumothorax, where instead of air around the collapsed  lung(s), you may have blood or chyle, respectively.       6. Spinal headaches: They may occur with any procedures in the area of the spine.       7. Persistent CSF (Cerebro-Spinal Fluid) leakage: This is a rare problem, but may occur  with prolonged intrathecal or epidural catheters either due to the formation of a fistulous  track or a dural tear.       8. Nerve damage: By working so close to the spinal cord, there is always a possibility of  nerve damage, which could be as serious as a permanent spinal cord injury with  paralysis.       9. Death:  Although rare, severe deadly allergic reactions known as "Anaphylactic  reaction"  can occur to any of the medications used.      10. Worsening of the symptoms:  We can always make thing worse.  What are the chances of something like this happening? Chances of any of this occuring are extremely low.  By statistics, you have more of a chance of getting killed in a motor vehicle accident: while driving to the hospital than any of the above occurring .  Nevertheless, you should be aware that they are possibilities.  In general, it is similar to taking a shower.  Everybody knows that you can slip, hit your head and get killed.  Does that mean that you should not shower   again?  Nevertheless always keep in mind that statistics do not mean anything if you happen to be on the wrong side of them.  Even if a procedure has a 1 (one) in a 1,000,000 (million) chance of going wrong, it you happen to be that one..Also, keep in mind that by statistics, you have more of a chance of having something go wrong when taking medications.  Who should not have this procedure? If you are on a blood thinning medication (e.g. Coumadin, Plavix, see list of "Blood Thinners"), or if you have an active infection going on, you should not have the procedure.  If you are taking any blood thinners, please inform your physician.  How should I prepare for this procedure?  Do not eat or drink anything at least six hours prior to the procedure.  Bring a driver with you .  It cannot be a taxi.  Come accompanied by an adult that can drive you back, and that is strong enough to help you if your legs get weak or numb from the local anesthetic.  Take all of your medicines the morning of the procedure with just enough water to swallow them.  If you have diabetes, make sure that you are scheduled to have your procedure done first thing in the morning, whenever possible.  If you have diabetes, take only half of your insulin dose and notify our nurse that you have done so as soon as you arrive at the clinic.  If you are  diabetic, but only take blood sugar pills (oral hypoglycemic), then do not take them on the morning of your procedure.  You may take them after you have had the procedure.  Do not take aspirin or any aspirin-containing medications, at least eleven (11) days prior to the procedure.  They may prolong bleeding.  Wear loose fitting clothing that may be easy to take off and that you would not mind if it got stained with Betadine or blood.  Do not wear any jewelry or perfume  Remove any nail coloring.  It will interfere with some of our monitoring equipment.  NOTE: Remember that this is not meant to be interpreted as a complete list of all possible complications.  Unforeseen problems may occur.  BLOOD THINNERS The following drugs contain aspirin or other products, which can cause increased bleeding during surgery and should not be taken for 2 weeks prior to and 1 week after surgery.  If you should need take something for relief of minor pain, you may take acetaminophen which is found in Tylenol,m Datril, Anacin-3 and Panadol. It is not blood thinner. The products listed below are.  Do not take any of the products listed below in addition to any listed on your instruction sheet.  A.P.C or A.P.C with Codeine Codeine Phosphate Capsules #3 Ibuprofen Ridaura  ABC compound Congesprin Imuran rimadil  Advil Cope Indocin Robaxisal  Alka-Seltzer Effervescent Pain Reliever and Antacid Coricidin or Coricidin-D  Indomethacin Rufen  Alka-Seltzer plus Cold Medicine Cosprin Ketoprofen S-A-C Tablets  Anacin Analgesic Tablets or Capsules Coumadin Korlgesic Salflex  Anacin Extra Strength Analgesic tablets or capsules CP-2 Tablets Lanoril Salicylate  Anaprox Cuprimine Capsules Levenox Salocol  Anexsia-D Dalteparin Magan Salsalate  Anodynos Darvon compound Magnesium Salicylate Sine-off  Ansaid Dasin Capsules Magsal Sodium Salicylate  Anturane Depen Capsules Marnal Soma  APF Arthritis pain formula Dewitt's Pills  Measurin Stanback  Argesic Dia-Gesic Meclofenamic Sulfinpyrazone  Arthritis Bayer Timed Release Aspirin Diclofenac Meclomen Sulindac  Arthritis pain formula Anacin Dicumarol   Medipren Supac  Analgesic (Safety coated) Arthralgen Diffunasal Mefanamic Suprofen  Arthritis Strength Bufferin Dihydrocodeine Mepro Compound Suprol  Arthropan liquid Dopirydamole Methcarbomol with Aspirin Synalgos  ASA tablets/Enseals Disalcid Micrainin Tagament  Ascriptin Doan's Midol Talwin  Ascriptin A/D Dolene Mobidin Tanderil  Ascriptin Extra Strength Dolobid Moblgesic Ticlid  Ascriptin with Codeine Doloprin or Doloprin with Codeine Momentum Tolectin  Asperbuf Duoprin Mono-gesic Trendar  Aspergum Duradyne Motrin or Motrin IB Triminicin  Aspirin plain, buffered or enteric coated Durasal Myochrisine Trigesic  Aspirin Suppositories Easprin Nalfon Trillsate  Aspirin with Codeine Ecotrin Regular or Extra Strength Naprosyn Uracel  Atromid-S Efficin Naproxen Ursinus  Auranofin Capsules Elmiron Neocylate Vanquish  Axotal Emagrin Norgesic Verin  Azathioprine Empirin or Empirin with Codeine Normiflo Vitamin E  Azolid Emprazil Nuprin Voltaren  Bayer Aspirin plain, buffered or children's or timed BC Tablets or powders Encaprin Orgaran Warfarin Sodium  Buff-a-Comp Enoxaparin Orudis Zorpin  Buff-a-Comp with Codeine Equegesic Os-Cal-Gesic   Buffaprin Excedrin plain, buffered or Extra Strength Oxalid   Bufferin Arthritis Strength Feldene Oxphenbutazone   Bufferin plain or Extra Strength Feldene Capsules Oxycodone with Aspirin   Bufferin with Codeine Fenoprofen Fenoprofen Pabalate or Pabalate-SF   Buffets II Flogesic Panagesic   Buffinol plain or Extra Strength Florinal or Florinal with Codeine Panwarfarin   Buf-Tabs Flurbiprofen Penicillamine   Butalbital Compound Four-way cold tablets Penicillin   Butazolidin Fragmin Pepto-Bismol   Carbenicillin Geminisyn Percodan   Carna Arthritis Reliever Geopen Persantine    Carprofen Gold's salt Persistin   Chloramphenicol Goody's Phenylbutazone   Chloromycetin Haltrain Piroxlcam   Clmetidine heparin Plaquenil   Cllnoril Hyco-pap Ponstel   Clofibrate Hydroxy chloroquine Propoxyphen         Before stopping any of these medications, be sure to consult the physician who ordered them.  Some, such as Coumadin (Warfarin) are ordered to prevent or treat serious conditions such as "deep thrombosis", "pumonary embolisms", and other heart problems.  The amount of time that you may need off of the medication may also vary with the medication and the reason for which you were taking it.  If you are taking any of these medications, please make sure you notify your pain physician before you undergo any procedures.          

## 2016-10-24 NOTE — Progress Notes (Signed)
Patient's Name: Danielle [name]  MRN: [id number]  Referring Provider: Care, [place] [facility]  DOB: Dec 06, 1960  PCP: Marygrace Drought, MD  DOS: 10/24/2016  Note by: Kathlen Brunswick. [name], MD  Service setting: Ambulatory outpatient  Specialty: Interventional Pain Management  Location: [facility] (AMB) Pain Management Facility    Patient type: Established   Primary Reason(s) for Visit: Encounter for prescription drug management & post-procedure evaluation of chronic illness with mild to moderate exacerbation(Level of risk: moderate) CC: Back Pain (lower); Hip Pain (right); Groin Pain (right); and Leg Pain (upper thigh is numb  and feels as if on fire)  HPI  Danielle [name] is a 56 y.o. year old, female patient, who comes today for a post-procedure evaluation and medication management. She has Long term current use of opiate analgesic; Long term prescription opiate use; Opiate use (60 MME/Day); B12 deficiency; Cervical spinal cord compression [facility]) (April, 2017); Cervical spinal stenosis; Chronic obstructive pulmonary disease ([place]); Clinical depression; BP (high blood pressure); Adult hypothyroidism; Adiposity; Mucositis oral; Groin pain; Current tobacco use; Chronic shoulder pain (Location of Primary Source of Pain) (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); History of total shoulder replacement (Bilateral); Chronic hip pain (Location of Secondary source of pain) (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); 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); Chronic low back pain (Location of Tertiary source of pain) (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); Lumbar facet syndrome (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); Lumbar spondylosis; Failed back surgical syndrome; Chronic lower extremity pain (Right); Chronic neck pain (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); Chronic cervical radicular pain (Right); Ulnar neuropathy of left upper extremity; Hx of cervical spine surgery; Cervical spondylosis; Encounter for therapeutic drug level monitoring; Encounter for pain management planning; Fibromyalgia; Chronic sacroiliac pain (B) (R>L); Seasonal  allergic rhinitis; Status post total replacement of right shoulder; Depression; Hypertension; Hypothyroid; Chronic pain syndrome; Complaints of weakness of lower extremity; Morbid obesity with BMI of 40.0-44.9, adult ([place]); Anterolisthesis; Cervical facet syndrome ([place]); Chronic musculoskeletal pain; Muscle spasm of back; Encounter for screening colonoscopy; Rectal bleeding; and Mixed simple and mucopurulent chronic bronchitis ([place]) on her problem list. Her primarily concern today is the Back Pain (lower); Hip Pain (right); Groin Pain (right); and Leg Pain (upper thigh is numb  and feels as if on fire)  Pain Assessment: Self-Reported Pain Score: 4 /10 Clinically the patient looks like a 2/10 Reported level is inconsistent with clinical observations. Information on the proper use of the pain scale provided to the patient today Pain Type: Chronic pain Pain Location: Back Pain Orientation: Lower Pain Descriptors / Indicators: Aching, Constant, Radiating Pain Frequency: Constant  Danielle [name] was last seen on 09/30/2016 for a procedure. During today's appointment we reviewed Danielle [name]'s post-procedure results, as well as her outpatient medication regimen.  Further details on both, my assessment(s), as well as the proposed treatment plan, please see below.  Controlled Substance Pharmacotherapy Assessment REMS (Risk Evaluation and Mitigation Strategy)  Analgesic: Oxycodone IR 10 mg every 6 hours (40 mg/day of oxycodone) MME/day: 60 mg/day.  Danielle Slack, RN  10/24/2016  1:13 PM  Sign at close encounter Nursing Pain Medication Assessment:  Safety precautions to be maintained throughout the outpatient stay will include: orient to surroundings, keep bed in low position, maintain call bell within reach at all times, provide assistance with transfer out of bed and ambulation.  Medication Inspection Compliance: Pill count conducted under aseptic conditions, in front of the patient. Neither the pills nor the  bottle was removed from the patient's sight at any time. Once count was completed pills were immediately returned to the patient in their original bottle.  Medication: Oxycodone  IR Pill/Patch Count: 26 of 120 pills remain Pill/Patch Appearance: Markings consistent with prescribed medication Bottle Appearance: Standard pharmacy container. Clearly labeled. Filled Date: 03 / 20 / 2018 Last Medication intake:  Today   Pharmacokinetics: Liberation and absorption (onset of action): WNL Distribution (time to peak effect): WNL Metabolism and excretion (duration of action): WNL         Pharmacodynamics: Desired effects: Analgesia: Danielle [name] reports >50% benefit. Functional ability: Patient reports that medication allows her to accomplish basic ADLs Clinically meaningful improvement in function (CMIF): Sustained CMIF goals met Perceived effectiveness: Described as relatively effective, allowing for increase in activities of daily living (ADL) Undesirable effects: Side-effects or Adverse reactions: None reported Monitoring: [place] PMP: Online review of the past 17-monthperiod conducted. Compliant with practice rules and regulations List of all UDS test(s) done:  Lab Results  Component Value Date   TOXASSSELUR FINAL 04/04/2016   SUMMARY FINAL 01/02/2016   Last UDS on record: ToxAssure Select 13  Date Value Ref Range Status  04/04/2016 FINAL  Final    Comment:    ==================================================================== TOXASSURE SELECT 13 (MW) ==================================================================== Test                             Result       Flag       Units Drug Present and Declared for Prescription Verification   Oxycodone                      4030         EXPECTED   ng/mg creat   Oxymorphone                    202          EXPECTED   ng/mg creat   Noroxycodone                   6337         EXPECTED   ng/mg creat   Noroxymorphone                 77            EXPECTED   ng/mg creat    Sources of oxycodone are scheduled prescription medications.    Oxymorphone, noroxycodone, and noroxymorphone are expected    metabolites of oxycodone. Oxymorphone is also available as a    scheduled prescription medication. ==================================================================== Test                      Result    Flag   Units      Ref Range   Creatinine              86               mg/dL      >=20 ==================================================================== Declared Medications:  The flagging and interpretation on this report are based on the  following declared medications.  Unexpected results may arise from  inaccuracies in the declared medications.  **Note: The testing scope of this panel includes these medications:  Oxycodone  **Note: The testing scope of this panel does not include following  reported medications:  Albuterol  Cetirizine (Zyrtec)  Citalopram (Celexa)  Diclofenac (Voltaren)  Levothyroxine (Synthroid)  Magnesium  Montelukast (Singulair)  Naloxone (Narcan)  Nystatin  Tiotropium (Spiriva)  Vitamin B12  Vitamin D3 ==================================================================== For clinical consultation, please call [phone]. ====================================================================    UDS interpretation: Compliant          Medication Assessment Form: Reviewed. Patient indicates being compliant with therapy Treatment compliance: Compliant Risk Assessment Profile: Aberrant behavior: See prior evaluations. None observed or detected today Comorbid factors increasing risk of overdose: See prior notes. No additional risks detected today Risk of substance use disorder (SUD): Low Opioid Risk Tool (ORT) Total Score:    Interpretation Table:  Score <3 = Low Risk for SUD  Score between 4-7 = Moderate Risk for SUD  Score >8 = High Risk for Opioid Abuse   Risk Mitigation Strategies:  Patient  Counseling: Covered Patient-Prescriber Agreement (PPA): Present and active  Notification to other healthcare providers: Done  Pharmacologic Plan: No change in therapy, at this time  Post-Procedure Assessment  09/30/2016 Procedure: Diagnostic Cervical Facet Medial Branch Block(s) Pre-procedure pain score:  4/10 Post-procedure pain score: 1/10         Influential Factors: BMI: 39.94 kg/m Intra-procedural challenges: None observed Assessment challenges: None detected         Post-procedural side-effects, adverse reactions, or complications: None reported Reported issues: None  Sedation: Sedation provided. When no sedatives are used, the analgesic levels obtained are directly associated to the effectiveness of the local anesthetics. However, when sedation is provided, the level of analgesia obtained during the initial 1 hour following the intervention, is believed to be the result of a combination of factors. These factors may include, but are not limited to: 1. The effectiveness of the local anesthetics used. 2. The effects of the analgesic(s) and/or anxiolytic(s) used. 3. The degree of discomfort experienced by the patient at the time of the procedure. 4. The patients ability and reliability in recalling and recording the events. 5. The presence and influence of possible secondary gains and/or psychosocial factors. Reported result: Relief experienced during the 1st hour after the procedure: 90 % (Ultra-Short Term Relief) Interpretative annotation: Analgesia during this period is likely to be Local Anesthetic and/or IV Sedative (Analgesic/Anxiolitic) related.          Effects of local anesthetic: The analgesic effects attained during this period are directly associated to the localized infiltration of local anesthetics and therefore cary significant diagnostic value as to the etiological location, or anatomical origin, of the pain. Expected duration of relief is directly dependent on the  pharmacodynamics of the local anesthetic used. Long-acting (4-6 hours) anesthetics used.  Reported result: Relief during the next 4 to 6 hour after the procedure: 100 % (Short-Term Relief) Interpretative annotation: Complete relief would suggest area to be the source of the pain.          Long-term benefit: Defined as the period of time past the expected duration of local anesthetics. With the possible exception of prolonged sympathetic blockade from the local anesthetics, benefits during this period are typically attributed to, or associated with, other factors such as analgesic sensory neuropraxia, antiinflammatory effects, or beneficial biochemical changes provided by agents other than the local anesthetics Reported result: Extended relief following procedure: 0 % (had some relief but the last week and half patient is back to 0  relief) (Long-Term Relief) Interpretative annotation: No benefit. This could suggest algesic mechanism to be mechanical rather than inflammatory.          Current benefits: Defined as persistent relief that continues at this point in time.   Reported results: Treated area: 0 %       Interpretative annotation: Recurrance of symptoms. This would suggest persistent  aggravating factors  Interpretation: Results would suggest a successful diagnostic intervention.          Laboratory Chemistry  Inflammation Markers Lab Results  Component Value Date   CRP 0.9 01/08/2016   ESRSEDRATE 37 (H) 01/08/2016   (CRP: Acute Phase) (ESR: Chronic Phase) Renal Function Markers Lab Results  Component Value Date   BUN 8 01/08/2016   CREATININE 0.94 01/08/2016   GFRAA >60 01/08/2016   GFRNONAA >60 01/08/2016   Hepatic Function Markers Lab Results  Component Value Date   AST 18 01/08/2016   ALT 14 01/08/2016   ALBUMIN 3.7 01/08/2016   ALKPHOS 79 01/08/2016   Electrolytes Lab Results  Component Value Date   NA 137 01/08/2016   K 3.5 01/08/2016   CL 101 01/08/2016    CALCIUM 9.0 01/08/2016   MG 2.1 01/08/2016   Neuropathy Markers Lab Results  Component Value Date   VITAMINB12 >7,500 (H) 01/08/2016   Bone Pathology Markers Lab Results  Component Value Date   ALKPHOS 79 01/08/2016   25OHVITD1 32 01/08/2016   25OHVITD2 2.0 01/08/2016   25OHVITD3 30 01/08/2016   CALCIUM 9.0 01/08/2016   Coagulation Parameters No results found for: INR, LABPROT, APTT, PLT Cardiovascular Markers No results found for: BNP, HGB, HCT Note: Lab results reviewed.  Recent Diagnostic Imaging Review  Dg C-arm 1-60 Min-no Report  Result Date: 09/30/2016 Fluoroscopy was utilized by the requesting physician.  No radiographic interpretation.   Note: Imaging results reviewed.          Meds  The patient has a current medication list which includes the following prescription(s): albuterol, cetirizine, vitamin d3, citalopram, cyanocobalamin, cyclobenzaprine, diclofenac sodium, docusate sodium, hydrocortisone-pramoxine, levothyroxine, loratadine, magnesium hydroxide, montelukast, naloxone, nystatin, oxycodone hcl, oxycodone hcl, oxycodone hcl, polyethylene glycol powder, and spiriva handihaler.  Current Outpatient Prescriptions on File Prior to Visit  Medication Sig  . albuterol (PROVENTIL HFA;VENTOLIN HFA) 108 (90 Base) MCG/ACT inhaler Inhale 2 puffs into the lungs every 4 (four) hours as needed for wheezing or shortness of breath.   . cetirizine (ZYRTEC) 10 MG tablet Take 10 mg by mouth daily.   . Cholecalciferol (VITAMIN D3) 2000 units capsule Take 2,000 Units by mouth daily.   . citalopram (CELEXA) 40 MG tablet Take 40 mg by mouth daily.  . cyanocobalamin (,VITAMIN B-12,) 1000 MCG/ML injection Inject into the muscle.  . cyclobenzaprine (FLEXERIL) 10 MG tablet Take 1 tablet (10 mg total) by mouth 3 (three) times daily as needed for muscle spasms.  . diclofenac sodium (VOLTAREN) 1 % GEL Apply 2 g topically 4 (four) times daily.   Marland Kitchen docusate sodium (COLACE) 100 MG capsule  Take 100 mg by mouth 2 (two) times daily.  . hydrocortisone-pramoxine (ANALPRAM HC) 2.5-1 % rectal cream Place 1 application rectally 2 (two) times daily.  Marland Kitchen levothyroxine (SYNTHROID, LEVOTHROID) 50 MCG tablet TAKE 1 TABLET (50 MCG TOTAL) BY MOUTH DAILY AT 0600.  Marland Kitchen loratadine (CLARITIN) 10 MG tablet TAKE 1 TABLET (10 MG TOTAL) BY MOUTH DAILY.  . magnesium hydroxide (MILK OF MAGNESIA) 400 MG/5ML suspension Take by mouth.  . montelukast (SINGULAIR) 10 MG tablet TAKE 1 TABLET (10 MG TOTAL) BY MOUTH NIGHTLY.  . naloxone (NARCAN) 2 MG/2ML injection Inject content of syringe into thigh muscle. Call 911.  . nystatin (MYCOSTATIN/NYSTOP) powder Apply topically.  . polyethylene glycol powder (GLYCOLAX/MIRALAX) powder 255 grams one bottle for colonoscopy prep  . SPIRIVA HANDIHALER 18 MCG inhalation capsule PLACE 1 CAPSULE (18 MCG TOTAL) INTO INHALER AND INHALE ONCE  DAILY.   No current facility-administered medications on file prior to visit.    ROS  Constitutional: Denies any fever or chills Gastrointestinal: No reported hemesis, hematochezia, vomiting, or acute GI distress Musculoskeletal: Denies any acute onset joint swelling, redness, loss of ROM, or weakness Neurological: No reported episodes of acute onset apraxia, aphasia, dysarthria, agnosia, amnesia, paralysis, loss of coordination, or loss of consciousness  Allergies  Ms. Winchel is allergic to aripiprazole; duloxetine; gabapentin; gold; iodinated diagnostic agents; naproxen sodium; nortriptyline hcl; nsaids; pregabalin; ace inhibitors; aspirin; cefpodoxime; fluoxetine; fluticasone-salmeterol; levofloxacin; lithium; meperidine; paroxetine; tape; telithromycin; theophylline; topiramate; trazodone; triamcinolone; venlafaxine; bupropion; ketorolac tromethamine; morphine; and moxifloxacin.  [place]  Drug: Ms. Sokol  reports that she does not use drugs. Alcohol:  reports that she does not drink alcohol. Tobacco:  reports that she has been smoking  Cigarettes.  She has a 40.00 pack-year smoking history. She has never used smokeless tobacco. Medical:  has a past medical history of Anxiety; Arthritis; Asthma; Bell's palsy; Bursitis; COPD (chronic obstructive pulmonary disease) ([place]); Depression; Fibromyalgia; Heart murmur; Hepatitis C; Hiatal hernia; Hyperlipidemia; Hypertension; IBS (irritable bowel syndrome); Insomnia; and Thyroid disease. Family: family history includes Cancer in her mother; Gout in her mother; Heart disease in her father.  Past Surgical History:  Procedure Laterality Date  . BACK SURGERY  2013   lumbar disk  . CARPAL TUNNEL RELEASE Right   . ELBOW SURGERY Left   . FOOT SURGERY Right   . necksurgery  10/21/2014   spine neck fusion  . NOSE SURGERY    . PARTIAL HYSTERECTOMY    . TOTAL SHOULDER REPLACEMENT Bilateral    Constitutional Exam  General appearance: Well nourished, well developed, and well hydrated. In no apparent acute distress Vitals:   10/24/16 1302  BP: 122/77  Pulse: 94  Resp: 16  Temp: 98.2 F (36.8 C)  TempSrc: Oral  SpO2: 97%  Weight: 240 lb (108.9 kg)  Height: 5' 5" (1.651 m)   BMI Assessment: Estimated body mass index is 39.94 kg/m as calculated from the following:   Height as of this encounter: 5' 5" (1.651 m).   Weight as of this encounter: 240 lb (108.9 kg).  BMI interpretation table: BMI level Category Range association with higher incidence of chronic pain  <18 kg/m2 Underweight   18.5-24.9 kg/m2 Ideal body weight   25-29.9 kg/m2 Overweight Increased incidence by 20%  30-34.9 kg/m2 Obese (Class I) Increased incidence by 68%  35-39.9 kg/m2 Severe obesity (Class II) Increased incidence by 136%  >40 kg/m2 Extreme obesity (Class III) Increased incidence by 254%   BMI Readings from Last 4 Encounters:  10/24/16 39.94 kg/m  09/30/16 40.94 kg/m  09/11/16 42.10 kg/m  09/05/16 41.27 kg/m   Wt Readings from Last 4 Encounters:  10/24/16 240 lb (108.9 kg)  09/30/16 246 lb  (111.6 kg)  09/11/16 253 lb (114.8 kg)  09/05/16 248 lb (112.5 kg)  Psych/Mental status: Alert, oriented x 3 (person, place, & time)       Eyes: PERLA Respiratory: No evidence of acute respiratory distress  Cervical Spine Exam  Inspection: No masses, redness, or swelling Alignment: Symmetrical Functional ROM: Unrestricted ROM Stability: No instability detected Muscle strength & Tone: Functionally intact Sensory: Unimpaired Palpation: No palpable anomalies  Upper Extremity (UE) Exam    Side: Right upper extremity  Side: Left upper extremity  Inspection: No masses, redness, swelling, or asymmetry. No contractures  Inspection: No masses, redness, swelling, or asymmetry. No contractures  Functional ROM: Unrestricted ROM  Functional ROM: Unrestricted ROM          Muscle strength & Tone: Functionally intact  Muscle strength & Tone: Functionally intact  Sensory: Unimpaired  Sensory: Unimpaired  Palpation: No palpable anomalies  Palpation: No palpable anomalies  Specialized Test(s): Deferred         Specialized Test(s): Deferred          Thoracic Spine Exam  Inspection: No masses, redness, or swelling Alignment: Symmetrical Functional ROM: Unrestricted ROM Stability: No instability detected Sensory: Unimpaired Muscle strength & Tone: No palpable anomalies  Lumbar Spine Exam  Inspection: No masses, redness, or swelling Alignment: Symmetrical Functional ROM: Unrestricted ROM Stability: No instability detected Muscle strength & Tone: Functionally intact Sensory: Unimpaired Palpation: No palpable anomalies Provocative Tests: Lumbar Hyperextension and rotation test: evaluation deferred today       Patrick's Maneuver: evaluation deferred today              Gait & Posture Assessment  Ambulation: Unassisted Gait: Relatively normal for age and body habitus Posture: WNL   Lower Extremity Exam    Side: Right lower extremity  Side: Left lower extremity  Inspection: No masses,  redness, swelling, or asymmetry. No contractures  Inspection: No masses, redness, swelling, or asymmetry. No contractures  Functional ROM: Unrestricted ROM          Functional ROM: Unrestricted ROM          Muscle strength & Tone: Functionally intact  Muscle strength & Tone: Functionally intact  Sensory: Unimpaired  Sensory: Unimpaired  Palpation: No palpable anomalies  Palpation: No palpable anomalies   Assessment  Primary Diagnosis & Pertinent Problem List: The primary encounter diagnosis was Chronic low back pain (Location of Tertiary source of pain) (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). Diagnoses of Chronic shoulder pain (Location of Primary Source of Pain) (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), Chronic hip pain, unspecified laterality, Cervical facet syndrome (HCC), Other osteoarthritis of spine, cervical region, Failed back surgical syndrome, Chronic sacroiliac pain (B) (R>L), Lumbar facet syndrome (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), Chronic pain syndrome, Lumbar spondylosis, Long term current use of opiate analgesic, and Opiate use (60 MME/Day) were also pertinent to this visit.  Status Diagnosis  Controlled Controlled Controlled 1. Chronic low back pain (Location of Tertiary source of pain) (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)   2. Chronic shoulder pain (Location of Primary Source of Pain) (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)   3. Chronic hip pain, unspecified laterality   4. Cervical facet syndrome (HCC)   5. Other osteoarthritis of spine, cervical region   6. Failed back surgical syndrome   7. Chronic sacroiliac pain (B) (R>L)   8. Lumbar facet syndrome (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)   9. Chronic pain syndrome   10. Lumbar spondylosis   11. Long term current use of opiate analgesic   12. Opiate use (60 MME/Day)      Plan of Care  Pharmacotherapy (Medications Ordered): Meds ordered this encounter  Medications  . Oxycodone HCl 10 MG TABS    Sig: Take 1 tablet (10 mg total) by mouth every 6 (six) hours as needed.    Dispense:  120 tablet    Refill:  0    Do not add this  medication to the electronic "Automatic Refill" notification system. Patient may have prescription filled one day early if pharmacy is closed on scheduled refill date. Do not fill until: 10/31/16 To last until: 11/30/16  . Oxycodone HCl 10 MG TABS    Sig: Take 1 tablet (10 mg total) by mouth every 6 (six) hours as needed.    Dispense:  120 tablet    Refill:  0    Do not add this medication to the electronic "Automatic Refill" notification system. Patient may have prescription filled one day early if pharmacy is closed on scheduled refill date. Do not fill until: 11/30/16 To last until: 12/30/16  . Oxycodone HCl 10 MG TABS    Sig: Take 1 tablet (10 mg total) by mouth every 6 (six) hours as needed.    Dispense:  120 tablet    Refill:  0    Do not add this medication to the electronic "Automatic Refill" notification system. Patient may have prescription filled one day early if pharmacy is closed on scheduled refill date. Do not fill until: 12/30/16 To last until: 01/29/17   New Prescriptions   No medications on file   Medications administered today: Ms. Lavalle had no medications administered during this visit. Lab-work, procedure(s), and/or referral(s): Orders Placed This Encounter  Procedures  . LUMBAR FACET(MEDIAL BRANCH NERVE BLOCK) MBNB   Imaging and/or referral(s): None  Interventional therapies: Planned, scheduled, and/or pending:   Diagnostic bilateral lumbar facet block under fluoroscopic guidance and IV sedation #2   Considering:   Palliative bilateral suprascapular nerve block Possible bilateral suprascapular nerve radiofrequencyablation Diagnostic bilateral intra-articular hip joint injection Possible bilateral hip joint radiofrequencyablation Diagnostic bilateral lumbar facet block Possible bilaterallumbar facet radiofrequencyablation Diagnostic caudal epiduralsteroid injection + diagnostic epidurogram Possible Racz procedure. Diagnostic bilateral cervical  facet block Possible bilateral cervical facet radiofrequencyablation Diagnostic right-sided cervical epidural steroid injection Diagnostic bilateral sacroiliac joint block Possible bilateral sacroiliac joint RFA   Palliative PRN treatment(s):   Palliative bilateral suprascapular nerve block Diagnostic bilateral intra-articular hip joint injection Diagnostic bilateral lumbar facet block Diagnostic caudal epiduralsteroid injection + diagnostic epidurogram Diagnostic bilateral cervical facet block Diagnostic right-sided cervical epidural steroid injection Diagnostic bilateral sacroiliac joint block   Provider-requested follow-up: Return in about 3 months (around 01/23/2017) for (Nurse Practitioner) Med-Mgmt, in addition, procedure (ASAP).  Future Appointments Date Time Provider [place]  10/30/2016 9:45 AM Milinda Pointer, MD [facility] None  01/23/2017 1:00 PM [place], NP [facility] Cognitive Disorders None   Primary Care Physician: Marygrace Drought, MD Location: [facility] Note by: Kathlen Brunswick. [name], M.D, DABA, DABAPM, DABPM, DABIPP, FIPP Date: 10/24/2016; Time: 1:33 PM  Pain Score Disclaimer: We use the NRS-11 scale. This is a self-reported, subjective measurement of pain severity with only modest accuracy. It is used primarily to identify changes within a particular patient. It must be understood that outpatient pain scales are significantly less accurate that those used for research, where they can be applied under ideal controlled circumstances with minimal exposure to variables. In reality, the score is likely to be a combination of pain intensity and pain affect, where pain affect describes the degree of emotional arousal or changes in action readiness caused by the sensory experience of pain. Factors such as social and work situation, setting, emotional state, anxiety levels, expectation, and prior pain experience may influence pain perception and show  large inter-individual differences that may also be affected by time variables.  Patient instructions provided during this appointment: Patient Instructions   Preparing for Procedure with Sedation Instructions: . Oral Intake: Do not eat or drink anything for at least 8 hours prior to your procedure. . Transportation: Public transportation is not allowed. Bring an adult driver. The driver must be physically present in our waiting room before any procedure can be started. Marland Kitchen Physical Assistance: Bring an adult physically capable of assisting you, in the  event you need help. This adult should keep you company at home for at least 6 hours after the procedure. . Blood Pressure Medicine: Take your blood pressure medicine with a sip of water the morning of the procedure. . Blood thinners:  . Diabetics on insulin: Notify the staff so that you can be scheduled 1st case in the morning. If your diabetes requires high dose insulin, take only  of your normal insulin dose the morning of the procedure and notify the staff that you have done so. . Preventing infections: Shower with an antibacterial soap the morning of your procedure. . Build-up your immune system: Take 1000 mg of Vitamin C with every meal (3 times a day) the day prior to your procedure. Marland Kitchen Antibiotics: Inform the staff if you have a condition or reason that requires you to take antibiotics before dental procedures. . Pregnancy: If you are pregnant, call and cancel the procedure. . Sickness: If you have a cold, fever, or any active infections, call and cancel the procedure. . Arrival: You must be in the facility at least 30 minutes prior to your scheduled procedure. . Children: Do not bring children with you. . Dress appropriately: Bring dark clothing that you would not mind if they get stained. . Valuables: Do not bring any jewelry or valuables. Procedure appointments are reserved for interventional treatments only. Marland Kitchen No Prescription  Refills. . No medication changes will be discussed during procedure appointments. . No disability issues will be discussed.  ____________________________________________________________________________________________   Facet Joint Block The facet joints connect the bones of the spine (vertebrae). They make it possible for you to bend, twist, and make other movements with your spine. They also keep you from bending too far, twisting too far, and making other excessive movements. A facet joint block is a procedure where a numbing medicine (anesthetic) is injected into a facet joint. Often, a type of anti-inflammatory medicine called a steroid is also injected. A facet joint block may be done to diagnose neck or back pain. If the pain gets better after a facet joint block, it means the pain is probably coming from the facet joint. If the pain does not get better, it means the pain is probably not coming from the facet joint. A facet joint block may also be done to relieve neck or back pain caused by an inflamed facet joint. A facet joint block is only done to relieve pain if the pain does not improve with other methods, such as medicine, exercise programs, and physical therapy. Tell a health care provider about:  Any allergies you have.  All medicines you are taking, including vitamins, herbs, eye drops, creams, and over-the-counter medicines.  Any problems you or family members have had with anesthetic medicines.  Any blood disorders you have.  Any surgeries you have had.  Any medical conditions you have.  Whether you are pregnant or may be pregnant. What are the risks? Generally, this is a safe procedure. However, problems may occur, including:  Bleeding.  Injury to a nerve near the injection site.  Pain at the injection site.  Weakness or numbness in areas controlled by nerves near the injection site.  Infection.  Temporary fluid retention.  Allergic reactions to medicines or  dyes.  Injury to other structures or organs near the injection site. What happens before the procedure?  Follow instructions from your health care provider about eating or drinking restrictions.  Ask your health care provider about:  Changing or stopping your regular  medicines. This is especially important if you are taking diabetes medicines or blood thinners.  Taking medicines such as aspirin and ibuprofen. These medicines can thin your blood. Do not take these medicines before your procedure if your health care provider instructs you not to.  Do not take any new dietary supplements or medicines without asking your health care provider first.  Plan to have someone take you home after the procedure. What happens during the procedure?  You may need to remove your clothing and dress in an open-back gown.  The procedure will be done while you are lying on an X-ray table. You will most likely be asked to lie on your stomach, but you may be asked to lie in a different position if an injection will be made in your neck.  Machines will be used to monitor your oxygen levels, heart rate, and blood pressure.  If an injection will be made in your neck, an IV tube will be inserted into one of your veins. Fluids and medicine will flow directly into your body through the IV tube.  The area over the facet joint where the injection will be made will be cleaned with soap. The surrounding skin will be covered with clean drapes.  A numbing medicine (local anesthetic) will be applied to your skin. Your skin may sting or burn for a moment.  A video X-ray machine (fluoroscopy) will be used to locate the joint. In some cases, a CT scan may be used.  A contrast dye may be injected into the facet joint area to help locate the joint.  When the joint is located, an anesthetic will be injected into the joint through the needle.  Your health care provider will ask you whether you feel pain relief. If you do  feel relief, a steroid may be injected to provide pain relief for a longer period of time. If you do not feel relief or feel only partial relief, additional injections of an anesthetic may be made in other facet joints.  The needle will be removed.  Your skin will be cleaned.  A bandage (dressing) will be applied over each injection site. The procedure may vary among health care providers and hospitals. What happens after the procedure?  You will be observed for 15-30 minutes before being allowed to go home. This information is not intended to replace advice given to you by your health care provider. Make sure you discuss any questions you have with your health care provider. Document Released: 11/20/2006 Document Revised: 08/02/2015 Document Reviewed: 03/27/2015 Elsevier Interactive Patient Education  2017 [employer]  What are the risk, side effects and possible complications? Generally speaking, most procedures are safe.  However, with any procedure there are risks, side effects, and the possibility of complications.  The risks and complications are dependent upon the sites that are lesioned, or the type of nerve block to be performed.  The closer the procedure is to the spine, the more serious the risks are.  Great care is taken when placing the radio frequency needles, block needles or lesioning probes, but sometimes complications can occur. 1. Infection: Any time there is an injection through the skin, there is a risk of infection.  This is why sterile conditions are used for these blocks.  There are four possible types of infection. 1. Localized skin infection. 2. Central Nervous System Infection-This can be in the form of Meningitis, which can be deadly. 3. Epidural Infections-This can be in  the form of an epidural abscess, which can cause pressure inside of the spine, causing compression of the spinal cord with subsequent paralysis. This would require  an emergency surgery to decompress, and there are no guarantees that the patient would recover from the paralysis. 4. Discitis-This is an infection of the intervertebral discs.  It occurs in about 1% of discography procedures.  It is difficult to treat and it may lead to surgery.        2. Pain: the needles have to go through skin and soft tissues, will cause soreness.       3. Damage to internal structures:  The nerves to be lesioned may be near blood vessels or    other nerves which can be potentially damaged.       4. Bleeding: Bleeding is more common if the patient is taking blood thinners such as  aspirin, Coumadin, Ticiid, Plavix, etc., or if he/she have some genetic predisposition  such as hemophilia. Bleeding into the spinal canal can cause compression of the spinal  cord with subsequent paralysis.  This would require an emergency surgery to  decompress and there are no guarantees that the patient would recover from the  paralysis.       5. Pneumothorax:  Puncturing of a lung is a possibility, every time a needle is introduced in  the area of the chest or upper back.  Pneumothorax refers to free air around the  collapsed lung(s), inside of the thoracic cavity (chest cavity).  Another two possible  complications related to a similar event would include: Hemothorax and Chylothorax.   These are variations of the Pneumothorax, where instead of air around the collapsed  lung(s), you may have blood or chyle, respectively.       6. Spinal headaches: They may occur with any procedures in the area of the spine.       7. Persistent CSF (Cerebro-Spinal Fluid) leakage: This is a rare problem, but may occur  with prolonged intrathecal or epidural catheters either due to the formation of a fistulous  track or a dural tear.       8. Nerve damage: By working so close to the spinal cord, there is always a possibility of  nerve damage, which could be as serious as a permanent spinal cord injury with  paralysis.        9. Death:  Although rare, severe deadly allergic reactions known as "Anaphylactic  reaction" can occur to any of the medications used.      10. Worsening of the symptoms:  We can always make thing worse.  What are the chances of something like this happening? Chances of any of this occuring are extremely low.  By statistics, you have more of a chance of getting killed in a motor vehicle accident: while driving to the hospital than any of the above occurring .  Nevertheless, you should be aware that they are possibilities.  In general, it is similar to taking a shower.  Everybody knows that you can slip, hit your head and get killed.  Does that mean that you should not shower again?  Nevertheless always keep in mind that statistics do not mean anything if you happen to be on the wrong side of them.  Even if a procedure has a 1 (one) in a 1,000,000 (million) chance of going wrong, it you happen to be that one..Also, keep in mind that by statistics, you have more of a chance of having something go  wrong when taking medications.  Who should not have this procedure? If you are on a blood thinning medication (e.g. Coumadin, Plavix, see list of "Blood Thinners"), or if you have an active infection going on, you should not have the procedure.  If you are taking any blood thinners, please inform your physician.  How should I prepare for this procedure?  Do not eat or drink anything at least six hours prior to the procedure.  Bring a driver with you .  It cannot be a taxi.  Come accompanied by an adult that can drive you back, and that is strong enough to help you if your legs get weak or numb from the local anesthetic.  Take all of your medicines the morning of the procedure with just enough water to swallow them.  If you have diabetes, make sure that you are scheduled to have your procedure done first thing in the morning, whenever possible.  If you have diabetes, take only half of your insulin dose  and notify our nurse that you have done so as soon as you arrive at the clinic.  If you are diabetic, but only take blood sugar pills (oral hypoglycemic), then do not take them on the morning of your procedure.  You may take them after you have had the procedure.  Do not take aspirin or any aspirin-containing medications, at least eleven (11) days prior to the procedure.  They may prolong bleeding.  Wear loose fitting clothing that may be easy to take off and that you would not mind if it got stained with Betadine or blood.  Do not wear any jewelry or perfume  Remove any nail coloring.  It will interfere with some of our monitoring equipment.  NOTE: Remember that this is not meant to be interpreted as a complete list of all possible complications.  Unforeseen problems may occur.  BLOOD THINNERS The following drugs contain aspirin or other products, which can cause increased bleeding during surgery and should not be taken for 2 weeks prior to and 1 week after surgery.  If you should need take something for relief of minor pain, you may take acetaminophen which is found in Tylenol,m Datril, Anacin-3 and Panadol. It is not blood thinner. The products listed below are.  Do not take any of the products listed below in addition to any listed on your instruction sheet.  A.P.C or A.P.C with Codeine Codeine Phosphate Capsules #3 Ibuprofen Ridaura  ABC compound Congesprin Imuran rimadil  Advil Cope Indocin Robaxisal  Alka-Seltzer Effervescent Pain Reliever and Antacid Coricidin or Coricidin-D  Indomethacin Rufen  Alka-Seltzer plus Cold Medicine Cosprin Ketoprofen S-A-C Tablets  Anacin Analgesic Tablets or Capsules Coumadin Korlgesic Salflex  Anacin Extra Strength Analgesic tablets or capsules CP-2 Tablets Lanoril Salicylate  Anaprox Cuprimine Capsules Levenox Salocol  Anexsia-D Dalteparin Magan Salsalate  Anodynos Darvon compound Magnesium Salicylate Sine-off  Ansaid Dasin Capsules Magsal Sodium  Salicylate  Anturane Depen Capsules Marnal Soma  APF Arthritis pain formula Dewitt's Pills Measurin Stanback  Argesic Dia-Gesic Meclofenamic Sulfinpyrazone  Arthritis Bayer Timed Release Aspirin Diclofenac Meclomen Sulindac  Arthritis pain formula Anacin Dicumarol Medipren Supac  Analgesic (Safety coated) Arthralgen Diffunasal Mefanamic Suprofen  Arthritis Strength Bufferin Dihydrocodeine Mepro Compound Suprol  Arthropan liquid Dopirydamole Methcarbomol with Aspirin Synalgos  ASA tablets/Enseals Disalcid Micrainin Tagament  Ascriptin Doan's Midol Talwin  Ascriptin A/D Dolene Mobidin Tanderil  Ascriptin Extra Strength Dolobid Moblgesic Ticlid  Ascriptin with Codeine Doloprin or Doloprin with Codeine Momentum Tolectin  Asperbuf Duoprin  Mono-gesic Trendar  Aspergum Duradyne Motrin or Motrin IB Triminicin  Aspirin plain, buffered or enteric coated Durasal Myochrisine Trigesic  Aspirin Suppositories Easprin Nalfon Trillsate  Aspirin with Codeine Ecotrin Regular or Extra Strength Naprosyn Uracel  Atromid-S Efficin Naproxen Ursinus  Auranofin Capsules Elmiron Neocylate Vanquish  Axotal Emagrin Norgesic Verin  Azathioprine Empirin or Empirin with Codeine Normiflo Vitamin E  Azolid Emprazil Nuprin Voltaren  Bayer Aspirin plain, buffered or children's or timed BC Tablets or powders Encaprin Orgaran Warfarin Sodium  Buff-a-Comp Enoxaparin Orudis Zorpin  Buff-a-Comp with Codeine Equegesic Os-Cal-Gesic   Buffaprin Excedrin plain, buffered or Extra Strength Oxalid   Bufferin Arthritis Strength Feldene Oxphenbutazone   Bufferin plain or Extra Strength Feldene Capsules Oxycodone with Aspirin   Bufferin with Codeine Fenoprofen Fenoprofen Pabalate or Pabalate-SF   Buffets II Flogesic Panagesic   Buffinol plain or Extra Strength Florinal or Florinal with Codeine Panwarfarin   Buf-Tabs Flurbiprofen Penicillamine   Butalbital Compound Four-way cold tablets Penicillin   Butazolidin Fragmin Pepto-Bismol    Carbenicillin Geminisyn Percodan   Carna Arthritis Reliever Geopen Persantine   Carprofen Gold's salt Persistin   Chloramphenicol Goody's Phenylbutazone   Chloromycetin Haltrain Piroxlcam   Clmetidine heparin Plaquenil   Cllnoril Hyco-pap Ponstel   Clofibrate Hydroxy chloroquine Propoxyphen         Before stopping any of these medications, be sure to consult the physician who ordered them.  Some, such as Coumadin (Warfarin) are ordered to prevent or treat serious conditions such as "deep thrombosis", "pumonary embolisms", and other heart problems.  The amount of time that you may need off of the medication may also vary with the medication and the reason for which you were taking it.  If you are taking any of these medications, please make sure you notify your pain physician before you undergo any procedures.

## 2016-10-24 NOTE — Progress Notes (Signed)
Nursing Pain Medication Assessment:  Safety precautions to be maintained throughout the outpatient stay will include: orient to surroundings, keep bed in low position, maintain call bell within reach at all times, provide assistance with transfer out of bed and ambulation.  Medication Inspection Compliance: Pill count conducted under aseptic conditions, in front of the patient. Neither the pills nor the bottle was removed from the patient's sight at any time. Once count was completed pills were immediately returned to the patient in their original bottle.  Medication: Oxycodone IR Pill/Patch Count: 26 of 120 pills remain Pill/Patch Appearance: Markings consistent with prescribed medication Bottle Appearance: Standard pharmacy container. Clearly labeled. Filled Date: 03 / 20 / 2018 Last Medication intake:  Today

## 2016-10-30 ENCOUNTER — Ambulatory Visit
Admission: RE | Admit: 2016-10-30 | Discharge: 2016-10-30 | Disposition: A | Discharge status: Home / Self Care | Payer: Medicaid Other | Source: Ambulatory Visit | Attending: Pain Medicine | Admitting: Pain Medicine

## 2016-10-30 ENCOUNTER — Ambulatory Visit (HOSPITAL_BASED_OUTPATIENT_CLINIC_OR_DEPARTMENT_OTHER): Payer: Medicaid Other | Admitting: Pain Medicine

## 2016-10-30 ENCOUNTER — Encounter: Payer: Self-pay | Admitting: Pain Medicine

## 2016-10-30 VITALS — BP 129/78 | HR 82 | Temp 97.9°F | Resp 16 | Ht 65.0 in | Wt 241.0 lb

## 2016-10-30 DIAGNOSIS — G8929 Other chronic pain: Secondary | ICD-10-CM | POA: Diagnosis present

## 2016-10-30 DIAGNOSIS — Z96612 Presence of left artificial shoulder joint: Secondary | ICD-10-CM | POA: Diagnosis not present

## 2016-10-30 DIAGNOSIS — Z96611 Presence of right artificial shoulder joint: Secondary | ICD-10-CM | POA: Diagnosis not present

## 2016-10-30 DIAGNOSIS — M4696 Unspecified inflammatory spondylopathy, lumbar region: Secondary | ICD-10-CM

## 2016-10-30 DIAGNOSIS — M47816 Spondylosis without myelopathy or radiculopathy, lumbar region: Secondary | ICD-10-CM | POA: Insufficient documentation

## 2016-10-30 DIAGNOSIS — M5441 Lumbago with sciatica, right side: Secondary | ICD-10-CM | POA: Diagnosis present

## 2016-10-30 DIAGNOSIS — G5602 Carpal tunnel syndrome, left upper limb: Secondary | ICD-10-CM | POA: Diagnosis not present

## 2016-10-30 MED ORDER — TRIAMCINOLONE ACETONIDE 40 MG/ML IJ SUSP
40.0000 mg | Freq: Once | INTRAMUSCULAR | Status: AC
Start: 2016-10-30 — End: 2016-10-30
  Administered 2016-10-30: 40 mg
  Filled 2016-10-30: qty 1

## 2016-10-30 MED ORDER — LIDOCAINE HCL (PF) 1 % IJ SOLN
10.0000 mL | Freq: Once | INTRAMUSCULAR | Status: AC
  Administered 2016-10-30: 5 mL
  Filled 2016-10-30: qty 10

## 2016-10-30 MED ORDER — MIDAZOLAM HCL 5 MG/5ML IJ SOLN
1.0000 mg | INTRAMUSCULAR | Status: DC | PRN
  Administered 2016-10-30: 2 mg via INTRAVENOUS
  Filled 2016-10-30: qty 5

## 2016-10-30 MED ORDER — LACTATED RINGERS IV SOLN: 1000.0000 mL | Freq: Once | INTRAVENOUS | Status: DC

## 2016-10-30 MED ORDER — ROPIVACAINE HCL 2 MG/ML IJ SOLN
9.0000 mL | Freq: Once | INTRAMUSCULAR | Status: DC
  Filled 2016-10-30: qty 10

## 2016-10-30 MED ORDER — TRIAMCINOLONE ACETONIDE 40 MG/ML IJ SUSP
40.0000 mg | Freq: Once | INTRAMUSCULAR | Status: AC
  Administered 2016-10-30: 40 mg
  Filled 2016-10-30: qty 1

## 2016-10-30 MED ORDER — FENTANYL CITRATE (PF) 100 MCG/2ML IJ SOLN
25.0000 ug | INTRAMUSCULAR | Status: DC | PRN
  Administered 2016-10-30: 50 ug via INTRAVENOUS
  Filled 2016-10-30: qty 2

## 2016-10-30 NOTE — Progress Notes (Addendum)
Patient's Name: Danielle [name]  MRN: [id number]  Referring Provider: Milinda Pointer, MD  DOB: 04/22/1961  PCP: Marygrace Drought, MD  DOS: 10/30/2016  Note by: Kathlen Brunswick. [name], MD  Service setting: Ambulatory outpatient  Location: [facility] (AMB) Pain Management Facility  Visit type: Procedure  Specialty: Interventional Pain Management  Patient type: Established   Primary Reason for Visit: Interventional Pain Management Treatment. CC: Back Pain (lower)  Procedure:  Anesthesia, Analgesia, Anxiolysis:  Type: Diagnostic Medial Branch Facet Block Region: Lumbar Level: L2, L3, L4, L5, & S1 Medial Branch Level(s) Laterality: Bilateral  Type: Local Anesthesia with Moderate (Conscious) Sedation Local Anesthetic: Lidocaine 1% Route: Intravenous (IV) IV Access: Secured Sedation: Meaningful verbal contact was maintained at all times during the procedure  Indication(s): Analgesia and Anxiety  Indications: 1. Lumbar facet syndrome (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)   2. Chronic low back pain (Location of Tertiary source of pain) (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)   3. Lumbar spondylosis    Pain Score: Pre-procedure: 4 /10 Post-procedure: 0-No pain/10  Pre-op Assessment:  Previous date of service: 10/24/16 Service provided: Evaluation Danielle [name] is a 56 y.o. (year old), female patient, seen today for interventional treatment. She  has a past surgical history that includes necksurgery (10/21/2014); Back surgery (2013); Foot surgery (Right); Elbow surgery (Left); Carpal tunnel release (Right); Nose surgery; Partial hysterectomy; and Total shoulder replacement (Bilateral). Her primarily concern today is the Back Pain (lower)  Initial Vital Signs: Blood pressure 136/90, pulse 88, temperature 98.4 F (36.9 C), temperature source Oral, resp. rate 16, height 5\' 5"  (1.651 m), weight 241 lb (109.3 kg), SpO2 100 %. BMI: 40.10 kg/m  Risk Assessment: Allergies: Reviewed. She is allergic to aripiprazole; duloxetine; gabapentin; gold;  iodinated diagnostic agents; naproxen sodium; nortriptyline hcl; nsaids; pregabalin; ace inhibitors; aspirin; cefpodoxime; fluoxetine; fluticasone-salmeterol; levofloxacin; lithium; meperidine; paroxetine; tape; telithromycin; theophylline; topiramate; trazodone; triamcinolone; venlafaxine; bupropion; ketorolac tromethamine; morphine; and moxifloxacin.  Allergy Precautions: None required Coagulopathies: "Reviewed. None identified.  Blood-thinner therapy: None at this time Active Infection(s): Reviewed. None identified. Danielle [name] is afebrile  Site Confirmation: Danielle [name] was asked to confirm the procedure and laterality before marking the site Procedure checklist: Completed Consent: Before the procedure and under the influence of no sedative(s), amnesic(s), or anxiolytics, the patient was informed of the treatment options, risks and possible complications. To fulfill our ethical and legal obligations, as recommended by the American Medical Association's Code of Ethics, I have informed the patient of my clinical impression; the nature and purpose of the treatment or procedure; the risks, benefits, and possible complications of the intervention; the alternatives, including doing nothing; the risk(s) and benefit(s) of the alternative treatment(s) or procedure(s); and the risk(s) and benefit(s) of doing nothing. The patient was provided information about the general risks and possible complications associated with the procedure. These may include, but are not limited to: failure to achieve desired goals, infection, bleeding, organ or nerve damage, allergic reactions, paralysis, and death. In addition, the patient was informed of those risks and complications associated to Spine-related procedures, such as failure to decrease pain; infection (i.e.: Meningitis, epidural or intraspinal abscess); bleeding (i.e.: epidural hematoma, subarachnoid hemorrhage, or any other type of intraspinal or peri-dural  bleeding); organ or nerve damage (i.e.: Any type of peripheral nerve, nerve root, or spinal cord injury) with subsequent damage to sensory, motor, and/or autonomic systems, resulting in permanent pain, numbness, and/or weakness of one or several areas of the body; allergic reactions; (i.e.: anaphylactic reaction); and/or death. Furthermore, the patient was informed of those risks  and complications associated with the medications. These include, but are not limited to: allergic reactions (i.e.: anaphylactic or anaphylactoid reaction(s)); adrenal axis suppression; blood sugar elevation that in diabetics may result in ketoacidosis or comma; water retention that in patients with history of congestive heart failure may result in shortness of breath, pulmonary edema, and decompensation with resultant heart failure; weight gain; swelling or edema; medication-induced neural toxicity; particulate matter embolism and blood vessel occlusion with resultant organ, and/or nervous system infarction; and/or aseptic necrosis of one or more joints. Finally, the patient was informed that Medicine is not an exact science; therefore, there is also the possibility of unforeseen or unpredictable risks and/or possible complications that may result in a catastrophic outcome. The patient indicated having understood very clearly. We have given the patient no guarantees and we have made no promises. Enough time was given to the patient to ask questions, all of which were answered to the patient's satisfaction. Danielle [name] has indicated that she wanted to continue with the procedure. Attestation: I, the ordering provider, attest that I have discussed with the patient the benefits, risks, side-effects, alternatives, likelihood of achieving goals, and potential problems during recovery for the procedure that I have provided informed consent. Date: 10/30/2016; Time: 10:20 AM  Pre-Procedure Preparation:  Monitoring: As per clinic protocol.  Respiration, ETCO2, SpO2, BP, heart rate and rhythm monitor placed and checked for adequate function Safety Precautions: Patient was assessed for positional comfort and pressure points before starting the procedure. Time-out: I initiated and conducted the "Time-out" before starting the procedure, as per protocol. The patient was asked to participate by confirming the accuracy of the "Time Out" information. Verification of the correct person, site, and procedure were performed and confirmed by me, the nursing staff, and the patient. "Time-out" conducted as per Joint Commission's Universal Protocol (UP.01.01.01). "Time-out" Date & Time: 10/30/2016; 1110 hrs.  Description of Procedure Process:   Position: Prone Target Area: For Lumbar Facet blocks, the target is the groove formed by the junction of the transverse process and superior articular process. For the L5 dorsal ramus, the target is the notch between superior articular process and sacral ala. For the S1 dorsal ramus, the target is the superior and lateral edge of the posterior S1 Sacral foramen. Approach: Paramedial approach. Area Prepped: Entire Posterior Lumbosacral Region Prepping solution: ChloraPrep (2% chlorhexidine gluconate and 70% isopropyl alcohol) Safety Precautions: Aspiration looking for blood return was conducted prior to all injections. At no point did we inject any substances, as a needle was being advanced. No attempts were made at seeking any paresthesias. Safe injection practices and needle disposal techniques used. Medications properly checked for expiration dates. SDV (single dose vial) medications used. Description of the Procedure: Protocol guidelines were followed. The patient was placed in position over the fluoroscopy table. The target area was identified and the area prepped in the usual manner. Skin desensitized using vapocoolant spray. Skin & deeper tissues infiltrated with local anesthetic. Appropriate amount of time  allowed to pass for local anesthetics to take effect. The procedure needle was introduced through the skin, ipsilateral to the reported pain, and advanced to the target area. Employing the "Medial Branch Technique", the needles were advanced to the angle made by the superior and medial portion of the transverse process, and the lateral and inferior portion of the superior articulating process of the targeted vertebral bodies. This area is known as "Burton's Eye" or the "Eye of the [place] Dog". A procedure needle was introduced through the skin,  and this time advanced to the angle made by the superior and medial border of the sacral ala, and the lateral border of the S1 vertebral body. This last needle was later repositioned at the superior and lateral border of the posterior S1 foramen. Negative aspiration confirmed. Solution injected in intermittent fashion, asking for systemic symptoms every 0.5cc of injectate. The needles were then removed and the area cleansed, making sure to leave some of the prepping solution back to take advantage of its long term bactericidal properties. Vitals:   10/30/16 1134 10/30/16 1144 10/30/16 1154 10/30/16 1204  BP: 131/77 114/67 124/74 129/78  Pulse: 96 86 89 82  Resp: 16 16 20 16   Temp: 97.9 F (36.6 C)     TempSrc:      SpO2: 97% 95% 97% 97%  Weight:      Height:        Start Time: 1110 hrs. End Time: 1122 hrs.  Illustration of the posterior view of the lumbar spine and the posterior neural structures. Laminae of L2 through S1 are labeled. DPRL5, dorsal primary ramus of L5; DPRS1, dorsal primary ramus of S1; DPR3, dorsal primary ramus of L3; FJ, facet (zygapophyseal) joint L3-L4; I, inferior articular process of L4; LB1, lateral branch of dorsal primary ramus of L1; IAB, inferior articular branches from L3 medial branch (supplies L4-L5 facet joint); IBP, intermediate branch plexus; MB3, medial branch of dorsal primary ramus of L3; NR3, third lumbar nerve root; S,  superior articular process of L5; SAB, superior articular branches from L4 (supplies L4-5 facet joint also); TP3, transverse process of L3.  Materials:  Needle(s) Type: Regular needle Gauge: 22G Length: 5-in Medication(s): We administered fentaNYL, midazolam, triamcinolone acetonide, lidocaine (PF), triamcinolone acetonide, and lidocaine (PF). Please see chart orders for dosing details.  Imaging Guidance (Spinal):  Type of Imaging Technique: Fluoroscopy Guidance (Spinal) Indication(s): Assistance in needle guidance and placement for procedures requiring needle placement in or near specific anatomical locations not easily accessible without such assistance. Exposure Time: Please see nurses notes. Contrast: None used. Fluoroscopic Guidance: I was personally present during the use of fluoroscopy. "Tunnel Vision Technique" used to obtain the best possible view of the target area. Parallax error corrected before commencing the procedure. "Direction-depth-direction" technique used to introduce the needle under continuous pulsed fluoroscopy. Once target was reached, antero-posterior, oblique, and lateral fluoroscopic projection used confirm needle placement in all planes. Images permanently stored in EMR. Interpretation: No contrast injected. I personally interpreted the imaging intraoperatively. Adequate needle placement confirmed in multiple planes. Permanent images saved into the patient's record.  Antibiotic Prophylaxis:  Indication(s): None identified Antibiotic given: None  Post-operative Assessment:  EBL: None Complications: No immediate post-treatment complications observed by team, or reported by patient. Note: The patient tolerated the entire procedure well. A repeat set of vitals were taken after the procedure and the patient was kept under observation following institutional policy, for this type of procedure. Post-procedural neurological assessment was performed, showing return to  baseline, prior to discharge. The patient was provided with post-procedure discharge instructions, including a section on how to identify potential problems. Should any problems arise concerning this procedure, the patient was given instructions to immediately contact us, at any time, without hesitation. In any case, we plan to contact the patient by telephone for a follow-up status report regarding this interventional procedure. Comments:  No additional relevant information.  Plan of Care  Disposition: Discharge home  Discharge Date & Time: 10/30/2016; 1205 hrs.  Physician-requested Follow-up:  Return in  about 2 weeks (around 11/13/2016) for Post-Procedure evaluation.  Future Appointments Date Time Provider [place]  11/25/2016 1:30 PM Milinda Pointer, MD [facility] None  01/23/2017 1:00 PM Kirby, NP [facility] None   Medications ordered for procedure: Meds ordered this encounter  Medications  . fentaNYL (SUBLIMAZE) injection 25-50 mcg    Make sure Narcan is available in the pyxis when using this medication. In the event of respiratory depression (RR< 8/min): Titrate NARCAN (naloxone) in increments of 0.1 to 0.2 mg IV at 2-3 minute intervals, until desired degree of reversal.  . lactated ringers infusion 1,000 mL  . midazolam (VERSED) 5 MG/5ML injection 1-2 mg    Make sure Flumazenil is available in the pyxis when using this medication. If oversedation occurs, administer 0.2 mg IV over 15 sec. If after 45 sec no response, administer 0.2 mg again over 1 min; may repeat at 1 min intervals; not to exceed 4 doses (1 mg)  . triamcinolone acetonide (KENALOG-40) injection 40 mg  . lidocaine (PF) (XYLOCAINE) 1 % injection 10 mL  . triamcinolone acetonide (KENALOG-40) injection 40 mg  . lidocaine (PF) (XYLOCAINE) 1 % injection 10 mL  . ropivacaine (PF) 2 mg/mL (0.2%) (NAROPIN) injection 9 mL  . ropivacaine (PF) 2 mg/mL (0.2%) (NAROPIN) injection 9 mL   Medications  administered: We administered fentaNYL, midazolam, triamcinolone acetonide, lidocaine (PF), triamcinolone acetonide, and lidocaine (PF).  See the medical record for exact dosing, route, and time of administration.  Lab-work, Procedure(s), & Referral(s) Ordered: Orders Placed This Encounter  Procedures  . DG C-Arm 1-60 Min-No Report  . Discharge instructions  . Follow-up  . Informed Consent Details: Transcribe to consent form and obtain patient signature  . Provider attestation of informed consent for procedure/surgical case  . Verify informed consent   Imaging Ordered: Results for orders placed in visit on 09/30/16  DG C-Arm 1-60 Min-No Report   Narrative Fluoroscopy was utilized by the requesting physician.  No radiographic  interpretation.    New Prescriptions   No medications on file   Primary Care Physician: Marygrace Drought, MD Location: [facility] Note by: Kathlen Brunswick. [name], M.D, DABA, DABAPM, DABPM, DABIPP, FIPP Date: 10/30/2016; Time: 12:57 PM  Disclaimer:  Medicine is not an exact science. The only guarantee in medicine is that nothing is guaranteed. It is important to note that the decision to proceed with this intervention was based on the information collected from the patient. The Data and conclusions were drawn from the patient's questionnaire, the interview, and the physical examination. Because the information was provided in large part by the patient, it cannot be guaranteed that it has not been purposely or unconsciously manipulated. Every effort has been made to obtain as much relevant data as possible for this evaluation. It is important to note that the conclusions that lead to this procedure are derived in large part from the available data. Always take into account that the treatment will also be dependent on availability of resources and existing treatment guidelines, considered by other Pain Management Practitioners as being common  knowledge and practice, at the time of the intervention. For Medico-Legal purposes, it is also important to point out that variation in procedural techniques and pharmacological choices are the acceptable norm. The indications, contraindications, technique, and results of the above procedure should only be interpreted and judged by a Board-Certified Interventional Pain Specialist with extensive familiarity and expertise in the same exact procedure and technique.   Instructions provided at this  appointment: Patient Instructions   Post-Procedure instructions Instructions:  Apply ice: Fill a plastic sandwich bag with crushed ice. Cover it with a small towel and apply to injection site. Apply for 15 minutes then remove x 15 minutes. Repeat sequence on day of procedure, until you go to bed. The purpose is to minimize swelling and discomfort after procedure.  Apply heat: Apply heat to procedure site starting the day following the procedure. The purpose is to treat any soreness and discomfort from the procedure.  Food intake: Start with clear liquids (like water) and advance to regular food, as tolerated.   Physical activities: Keep activities to a minimum for the first 8 hours after the procedure.   Driving: If you have received any sedation, you are not allowed to drive for 24 hours after your procedure.  Blood thinner: Restart your blood thinner 6 hours after your procedure. (Only for those taking blood thinners)  Insulin: As soon as you can eat, you may resume your normal dosing schedule. (Only for those taking insulin)  Infection prevention: Keep procedure site clean and dry.  Post-procedure Pain Diary: Extremely important that this be done correctly and accurately. Recorded information will be used to determine the next step in treatment.  Pain evaluated is that of treated area only. Do not include pain from an untreated area.  Complete every hour, on the hour, for the initial 8 hours. Set  an alarm to help you do this part accurately.  Do not go to sleep and have it completed later. It will not be accurate.  Follow-up appointment: Keep your follow-up appointment after the procedure. Usually 2 weeks for most procedures. (6 weeks in the case of radiofrequency.) Bring you pain diary.  Expect:  From numbing medicine (AKA: Local Anesthetics): Numbness or decrease in pain.  Onset: Full effect within 15 minutes of injected.  Duration: It will depend on the type of local anesthetic used. On the average, 1 to 8 hours.   From steroids: Decrease in swelling or inflammation. Once inflammation is improved, relief of the pain will follow.  Onset of benefits: Depends on the amount of swelling present. The more swelling, the longer it will take for the benefits to be seen.   Duration: Steroids will stay in the system x 2 weeks. Duration of benefits will depend on multiple posibilities including persistent irritating factors.  From procedure: Some discomfort is to be expected once the numbing medicine wears off. This should be minimal if ice and heat are applied as instructed. Call if:  You experience numbness and weakness that gets worse with time, as opposed to wearing off.  New onset bowel or bladder incontinence. (Spinal procedures only)  Emergency Numbers:  Durning business hours (Monday - Thursday, 8:00 AM - 4:00 PM) (Friday, 9:00 AM - 12:00 Noon): [phone]  After hours: [phone]   __________________________________________________________________________________________   Pain Management Discharge Instructions  General Discharge Instructions :  If you need to reach your doctor call: Monday-Friday 8:00 am - 4:00 pm at [phone] or toll free [phone].  After clinic hours [phone] to have operator reach doctor.  Bring all of your medication bottles to all your appointments in the pain clinic.  To cancel or reschedule your appointment with Pain  Management please remember to call 24 hours in advance to avoid a fee.  Refer to the educational materials which you have been given on: General Risks, I had my Procedure. Discharge Instructions, Post Sedation.  Post Procedure Instructions:  The drugs  you were given will stay in your system until tomorrow, so for the next 24 hours you should not drive, make any legal decisions or drink any alcoholic beverages.  You may eat anything you prefer, but it is better to start with liquids then soups and crackers, and gradually work up to solid foods.  Please notify your doctor immediately if you have any unusual bleeding, trouble breathing or pain that is not related to your normal pain.  Depending on the type of procedure that was done, some parts of your body may feel week and/or numb.  This usually clears up by tonight or the next day.  Walk with the use of an assistive device or accompanied by an adult for the 24 hours.  You may use ice on the affected area for the first 24 hours.  Put ice in a Ziploc bag and cover with a towel and place against area 15 minutes on 15 minutes off.  You may switch to heat after 24 hours.GENERAL RISKS AND COMPLICATIONS  What are the risk, side effects and possible complications? Generally speaking, most procedures are safe.  However, with any procedure there are risks, side effects, and the possibility of complications.  The risks and complications are dependent upon the sites that are lesioned, or the type of nerve block to be performed.  The closer the procedure is to the spine, the more serious the risks are.  Great care is taken when placing the radio frequency needles, block needles or lesioning probes, but sometimes complications can occur. 1. Infection: Any time there is an injection through the skin, there is a risk of infection.  This is why sterile conditions are used for these blocks.  There are four possible types of infection. 1. Localized skin  infection. 2. Central Nervous System Infection-This can be in the form of Meningitis, which can be deadly. 3. Epidural Infections-This can be in the form of an epidural abscess, which can cause pressure inside of the spine, causing compression of the spinal cord with subsequent paralysis. This would require an emergency surgery to decompress, and there are no guarantees that the patient would recover from the paralysis. 4. Discitis-This is an infection of the intervertebral discs.  It occurs in about 1% of discography procedures.  It is difficult to treat and it may lead to surgery.        2. Pain: the needles have to go through skin and soft tissues, will cause soreness.       3. Damage to internal structures:  The nerves to be lesioned may be near blood vessels or    other nerves which can be potentially damaged.       4. Bleeding: Bleeding is more common if the patient is taking blood thinners such as  aspirin, Coumadin, Ticiid, Plavix, etc., or if he/she have some genetic predisposition  such as hemophilia. Bleeding into the spinal canal can cause compression of the spinal  cord with subsequent paralysis.  This would require an emergency surgery to  decompress and there are no guarantees that the patient would recover from the  paralysis.       5. Pneumothorax:  Puncturing of a lung is a possibility, every time a needle is introduced in  the area of the chest or upper back.  Pneumothorax refers to free air around the  collapsed lung(s), inside of the thoracic cavity (chest cavity).  Another two possible  complications related to a similar event would include: Hemothorax and  Chylothorax.   These are variations of the Pneumothorax, where instead of air around the collapsed  lung(s), you may have blood or chyle, respectively.       6. Spinal headaches: They may occur with any procedures in the area of the spine.       7. Persistent CSF (Cerebro-Spinal Fluid) leakage: This is a rare problem, but may occur   with prolonged intrathecal or epidural catheters either due to the formation of a fistulous  track or a dural tear.       8. Nerve damage: By working so close to the spinal cord, there is always a possibility of  nerve damage, which could be as serious as a permanent spinal cord injury with  paralysis.       9. Death:  Although rare, severe deadly allergic reactions known as "Anaphylactic  reaction" can occur to any of the medications used.      10. Worsening of the symptoms:  We can always make thing worse.  What are the chances of something like this happening? Chances of any of this occuring are extremely low.  By statistics, you have more of a chance of getting killed in a motor vehicle accident: while driving to the hospital than any of the above occurring .  Nevertheless, you should be aware that they are possibilities.  In general, it is similar to taking a shower.  Everybody knows that you can slip, hit your head and get killed.  Does that mean that you should not shower again?  Nevertheless always keep in mind that statistics do not mean anything if you happen to be on the wrong side of them.  Even if a procedure has a 1 (one) in a 1,000,000 (million) chance of going wrong, it you happen to be that one..Also, keep in mind that by statistics, you have more of a chance of having something go wrong when taking medications.  Who should not have this procedure? If you are on a blood thinning medication (e.g. Coumadin, Plavix, see list of "Blood Thinners"), or if you have an active infection going on, you should not have the procedure.  If you are taking any blood thinners, please inform your physician.  How should I prepare for this procedure?  Do not eat or drink anything at least six hours prior to the procedure.  Bring a driver with you .  It cannot be a taxi.  Come accompanied by an adult that can drive you back, and that is strong enough to help you if your legs get weak or numb from the  local anesthetic.  Take all of your medicines the morning of the procedure with just enough water to swallow them.  If you have diabetes, make sure that you are scheduled to have your procedure done first thing in the morning, whenever possible.  If you have diabetes, take only half of your insulin dose and notify our nurse that you have done so as soon as you arrive at the clinic.  If you are diabetic, but only take blood sugar pills (oral hypoglycemic), then do not take them on the morning of your procedure.  You may take them after you have had the procedure.  Do not take aspirin or any aspirin-containing medications, at least eleven (11) days prior to the procedure.  They may prolong bleeding.  Wear loose fitting clothing that may be easy to take off and that you would not mind if it got stained with Betadine  or blood.  Do not wear any jewelry or perfume  Remove any nail coloring.  It will interfere with some of our monitoring equipment.  NOTE: Remember that this is not meant to be interpreted as a complete list of all possible complications.  Unforeseen problems may occur.  BLOOD THINNERS The following drugs contain aspirin or other products, which can cause increased bleeding during surgery and should not be taken for 2 weeks prior to and 1 week after surgery.  If you should need take something for relief of minor pain, you may take acetaminophen which is found in Tylenol,m Datril, Anacin-3 and Panadol. It is not blood thinner. The products listed below are.  Do not take any of the products listed below in addition to any listed on your instruction sheet.  A.P.C or A.P.C with Codeine Codeine Phosphate Capsules #3 Ibuprofen Ridaura  ABC compound Congesprin Imuran rimadil  Advil Cope Indocin Robaxisal  Alka-Seltzer Effervescent Pain Reliever and Antacid Coricidin or Coricidin-D  Indomethacin Rufen  Alka-Seltzer plus Cold Medicine Cosprin Ketoprofen S-A-C Tablets  Anacin Analgesic  Tablets or Capsules Coumadin Korlgesic Salflex  Anacin Extra Strength Analgesic tablets or capsules CP-2 Tablets Lanoril Salicylate  Anaprox Cuprimine Capsules Levenox Salocol  Anexsia-D Dalteparin Magan Salsalate  Anodynos Darvon compound Magnesium Salicylate Sine-off  Ansaid Dasin Capsules Magsal Sodium Salicylate  Anturane Depen Capsules Marnal Soma  APF Arthritis pain formula Dewitt's Pills Measurin Stanback  Argesic Dia-Gesic Meclofenamic Sulfinpyrazone  Arthritis Bayer Timed Release Aspirin Diclofenac Meclomen Sulindac  Arthritis pain formula Anacin Dicumarol Medipren Supac  Analgesic (Safety coated) Arthralgen Diffunasal Mefanamic Suprofen  Arthritis Strength Bufferin Dihydrocodeine Mepro Compound Suprol  Arthropan liquid Dopirydamole Methcarbomol with Aspirin Synalgos  ASA tablets/Enseals Disalcid Micrainin Tagament  Ascriptin Doan's Midol Talwin  Ascriptin A/D Dolene Mobidin Tanderil  Ascriptin Extra Strength Dolobid Moblgesic Ticlid  Ascriptin with Codeine Doloprin or Doloprin with Codeine Momentum Tolectin  Asperbuf Duoprin Mono-gesic Trendar  Aspergum Duradyne Motrin or Motrin IB Triminicin  Aspirin plain, buffered or enteric coated Durasal Myochrisine Trigesic  Aspirin Suppositories Easprin Nalfon Trillsate  Aspirin with Codeine Ecotrin Regular or Extra Strength Naprosyn Uracel  Atromid-S Efficin Naproxen Ursinus  Auranofin Capsules Elmiron Neocylate Vanquish  Axotal Emagrin Norgesic Verin  Azathioprine Empirin or Empirin with Codeine Normiflo Vitamin E  Azolid Emprazil Nuprin Voltaren  Bayer Aspirin plain, buffered or children's or timed BC Tablets or powders Encaprin Orgaran Warfarin Sodium  Buff-a-Comp Enoxaparin Orudis Zorpin  Buff-a-Comp with Codeine Equegesic Os-Cal-Gesic   Buffaprin Excedrin plain, buffered or Extra Strength Oxalid   Bufferin Arthritis Strength Feldene Oxphenbutazone   Bufferin plain or Extra Strength Feldene Capsules Oxycodone with Aspirin    Bufferin with Codeine Fenoprofen Fenoprofen Pabalate or Pabalate-SF   Buffets II Flogesic Panagesic   Buffinol plain or Extra Strength Florinal or Florinal with Codeine Panwarfarin   Buf-Tabs Flurbiprofen Penicillamine   Butalbital Compound Four-way cold tablets Penicillin   Butazolidin Fragmin Pepto-Bismol   Carbenicillin Geminisyn Percodan   Carna Arthritis Reliever Geopen Persantine   Carprofen Gold's salt Persistin   Chloramphenicol Goody's Phenylbutazone   Chloromycetin Haltrain Piroxlcam   Clmetidine heparin Plaquenil   Cllnoril Hyco-pap Ponstel   Clofibrate Hydroxy chloroquine Propoxyphen         Before stopping any of these medications, be sure to consult the physician who ordered them.  Some, such as Coumadin (Warfarin) are ordered to prevent or treat serious conditions such as "deep thrombosis", "pumonary embolisms", and other heart problems.  The amount of time that you may need  off of the medication may also vary with the medication and the reason for which you were taking it.  If you are taking any of these medications, please make sure you notify your pain physician before you undergo any procedures.         Facet Blocks Patient Information  Description: The facets are joints in the spine between the vertebrae.  Like any joints in the body, facets can become irritated and painful.  Arthritis can also effect the facets.  By injecting steroids and local anesthetic in and around these joints, we can temporarily block the nerve supply to them.  Steroids act directly on irritated nerves and tissues to reduce selling and inflammation which often leads to decreased pain.  Facet blocks may be done anywhere along the spine from the neck to the low back depending upon the location of your pain.   After numbing the skin with local anesthetic (like Novocaine), a small needle is passed onto the facet joints under x-ray guidance.  You may experience a sensation of pressure while this  is being done.  The entire block usually lasts about 15-25 minutes.   Conditions which may be treated by facet blocks:   Low back/buttock pain  Neck/shoulder pain  Certain types of headaches  Preparation for the injection:  1. Do not eat any solid food or dairy products within 8 hours of your appointment. 2. You may drink clear liquid up to 3 hours before appointment.  Clear liquids include water, black coffee, juice or soda.  No milk or cream please. 3. You may take your regular medication, including pain medications, with a sip of water before your appointment.  Diabetics should hold regular insulin (if taken separately) and take 1/2 normal NPH dose the morning of the procedure.  Carry some sugar containing items with you to your appointment. 4. A driver must accompany you and be prepared to drive you home after your procedure. 5. Bring all your current medications with you. 6. An IV may be inserted and sedation may be given at the discretion of the physician. 7. A blood pressure cuff, EKG and other monitors will often be applied during the procedure.  Some patients may need to have extra oxygen administered for a short period. 8. You will be asked to provide medical information, including your allergies and medications, prior to the procedure.  We must know immediately if you are taking blood thinners (like Coumadin/Warfarin) or if you are allergic to IV iodine contrast (dye).  We must know if you could possible be pregnant.  Possible side-effects:   Bleeding from needle site  Infection (rare, may require surgery)  Nerve injury (rare)  Numbness & tingling (temporary)  Difficulty urinating (rare, temporary)  Spinal headache (a headache worse with upright posture)  Light-headedness (temporary)  Pain at injection site (serveral days)  Decreased blood pressure (rare, temporary)  Weakness in arm/leg (temporary)  Pressure sensation in back/neck (temporary)   Call if you  experience:   Fever/chills associated with headache or increased back/neck pain  Headache worsened by an upright position  New onset, weakness or numbness of an extremity below the injection site  Hives or difficulty breathing (go to the emergency room)  Inflammation or drainage at the injection site(s)  Severe back/neck pain greater than usual  New symptoms which are concerning to you  Please note:  Although the local anesthetic injected can often make your back or neck feel good for several hours after the injection, the  pain will likely return. It takes 3-7 days for steroids to work.  You may not notice any pain relief for at least one week.  If effective, we will often do a series of 2-3 injections spaced 3-6 weeks apart to maximally decrease your pain.  After the initial series, you may be a candidate for a more permanent nerve block of the facets.  If you have any questions, please call #[phone] [facility]

## 2016-10-30 NOTE — Progress Notes (Signed)
Safety precautions to be maintained throughout the outpatient stay will include: orient to surroundings, keep bed in low position, maintain call bell within reach at all times, provide assistance with transfer out of bed and ambulation.  

## 2016-10-30 NOTE — Patient Instructions (Addendum)
Post-Procedure instructions Instructions:  Apply ice: Fill a plastic sandwich bag with crushed ice. Cover it with a small towel and apply to injection site. Apply for 15 minutes then remove x 15 minutes. Repeat sequence on day of procedure, until you go to bed. The purpose is to minimize swelling and discomfort after procedure.  Apply heat: Apply heat to procedure site starting the day following the procedure. The purpose is to treat any soreness and discomfort from the procedure.  Food intake: Start with clear liquids (like water) and advance to regular food, as tolerated.   Physical activities: Keep activities to a minimum for the first 8 hours after the procedure.   Driving: If you have received any sedation, you are not allowed to drive for 24 hours after your procedure.  Blood thinner: Restart your blood thinner 6 hours after your procedure. (Only for those taking blood thinners)  Insulin: As soon as you can eat, you may resume your normal dosing schedule. (Only for those taking insulin)  Infection prevention: Keep procedure site clean and dry.  Post-procedure Pain Diary: Extremely important that this be done correctly and accurately. Recorded information will be used to determine the next step in treatment.  Pain evaluated is that of treated area only. Do not include pain from an untreated area.  Complete every hour, on the hour, for the initial 8 hours. Set an alarm to help you do this part accurately.  Do not go to sleep and have it completed later. It will not be accurate.  Follow-up appointment: Keep your follow-up appointment after the procedure. Usually 2 weeks for most procedures. (6 weeks in the case of radiofrequency.) Bring you pain diary.  Expect:  From numbing medicine (AKA: Local Anesthetics): Numbness or decrease in pain.  Onset: Full effect within 15 minutes of injected.  Duration: It will depend on the type of local anesthetic used. On the average, 1 to 8  hours.   From steroids: Decrease in swelling or inflammation. Once inflammation is improved, relief of the pain will follow.  Onset of benefits: Depends on the amount of swelling present. The more swelling, the longer it will take for the benefits to be seen.   Duration: Steroids will stay in the system x 2 weeks. Duration of benefits will depend on multiple posibilities including persistent irritating factors.  From procedure: Some discomfort is to be expected once the numbing medicine wears off. This should be minimal if ice and heat are applied as instructed. Call if:  You experience numbness and weakness that gets worse with time, as opposed to wearing off.  New onset bowel or bladder incontinence. (Spinal procedures only)  Emergency Numbers:  Durning business hours (Monday - Thursday, 8:00 AM - 4:00 PM) (Friday, 9:00 AM - 12:00 Noon): [phone]  After hours: [phone]   __________________________________________________________________________________________   Pain Management Discharge Instructions  General Discharge Instructions :  If you need to reach your doctor call: Monday-Friday 8:00 am - 4:00 pm at [phone] or toll free [phone].  After clinic hours [phone] to have operator reach doctor.  Bring all of your medication bottles to all your appointments in the pain clinic.  To cancel or reschedule your appointment with Pain Management please remember to call 24 hours in advance to avoid a fee.  Refer to the educational materials which you have been given on: General Risks, I had my Procedure. Discharge Instructions, Post Sedation.  Post Procedure Instructions:  The drugs you were given will stay in   your system until tomorrow, so for the next 24 hours you should not drive, make any legal decisions or drink any alcoholic beverages.  You may eat anything you prefer, but it is better to start with liquids then soups and crackers, and gradually  work up to solid foods.  Please notify your doctor immediately if you have any unusual bleeding, trouble breathing or pain that is not related to your normal pain.  Depending on the type of procedure that was done, some parts of your body may feel week and/or numb.  This usually clears up by tonight or the next day.  Walk with the use of an assistive device or accompanied by an adult for the 24 hours.  You may use ice on the affected area for the first 24 hours.  Put ice in a Ziploc bag and cover with a towel and place against area 15 minutes on 15 minutes off.  You may switch to heat after 24 hours.GENERAL RISKS AND COMPLICATIONS  What are the risk, side effects and possible complications? Generally speaking, most procedures are safe.  However, with any procedure there are risks, side effects, and the possibility of complications.  The risks and complications are dependent upon the sites that are lesioned, or the type of nerve block to be performed.  The closer the procedure is to the spine, the more serious the risks are.  Great care is taken when placing the radio frequency needles, block needles or lesioning probes, but sometimes complications can occur. 1. Infection: Any time there is an injection through the skin, there is a risk of infection.  This is why sterile conditions are used for these blocks.  There are four possible types of infection. 1. Localized skin infection. 2. Central Nervous System Infection-This can be in the form of Meningitis, which can be deadly. 3. Epidural Infections-This can be in the form of an epidural abscess, which can cause pressure inside of the spine, causing compression of the spinal cord with subsequent paralysis. This would require an emergency surgery to decompress, and there are no guarantees that the patient would recover from the paralysis. 4. Discitis-This is an infection of the intervertebral discs.  It occurs in about 1% of discography procedures.  It  is difficult to treat and it may lead to surgery.        2. Pain: the needles have to go through skin and soft tissues, will cause soreness.       3. Damage to internal structures:  The nerves to be lesioned may be near blood vessels or    other nerves which can be potentially damaged.       4. Bleeding: Bleeding is more common if the patient is taking blood thinners such as  aspirin, Coumadin, Ticiid, Plavix, etc., or if he/she have some genetic predisposition  such as hemophilia. Bleeding into the spinal canal can cause compression of the spinal  cord with subsequent paralysis.  This would require an emergency surgery to  decompress and there are no guarantees that the patient would recover from the  paralysis.       5. Pneumothorax:  Puncturing of a lung is a possibility, every time a needle is introduced in  the area of the chest or upper back.  Pneumothorax refers to free air around the  collapsed lung(s), inside of the thoracic cavity (chest cavity).  Another two possible  complications related to a similar event would include: Hemothorax and Chylothorax.   These are  variations of the Pneumothorax, where instead of air around the collapsed  lung(s), you may have blood or chyle, respectively.       6. Spinal headaches: They may occur with any procedures in the area of the spine.       7. Persistent CSF (Cerebro-Spinal Fluid) leakage: This is a rare problem, but may occur  with prolonged intrathecal or epidural catheters either due to the formation of a fistulous  track or a dural tear.       8. Nerve damage: By working so close to the spinal cord, there is always a possibility of  nerve damage, which could be as serious as a permanent spinal cord injury with  paralysis.       9. Death:  Although rare, severe deadly allergic reactions known as "Anaphylactic  reaction" can occur to any of the medications used.      10. Worsening of the symptoms:  We can always make thing worse.  What are the chances  of something like this happening? Chances of any of this occuring are extremely low.  By statistics, you have more of a chance of getting killed in a motor vehicle accident: while driving to the hospital than any of the above occurring .  Nevertheless, you should be aware that they are possibilities.  In general, it is similar to taking a shower.  Everybody knows that you can slip, hit your head and get killed.  Does that mean that you should not shower again?  Nevertheless always keep in mind that statistics do not mean anything if you happen to be on the wrong side of them.  Even if a procedure has a 1 (one) in a 1,000,000 (million) chance of going wrong, it you happen to be that one..Also, keep in mind that by statistics, you have more of a chance of having something go wrong when taking medications.  Who should not have this procedure? If you are on a blood thinning medication (e.g. Coumadin, Plavix, see list of "Blood Thinners"), or if you have an active infection going on, you should not have the procedure.  If you are taking any blood thinners, please inform your physician.  How should I prepare for this procedure?  Do not eat or drink anything at least six hours prior to the procedure.  Bring a driver with you .  It cannot be a taxi.  Come accompanied by an adult that can drive you back, and that is strong enough to help you if your legs get weak or numb from the local anesthetic.  Take all of your medicines the morning of the procedure with just enough water to swallow them.  If you have diabetes, make sure that you are scheduled to have your procedure done first thing in the morning, whenever possible.  If you have diabetes, take only half of your insulin dose and notify our nurse that you have done so as soon as you arrive at the clinic.  If you are diabetic, but only take blood sugar pills (oral hypoglycemic), then do not take them on the morning of your procedure.  You may take them  after you have had the procedure.  Do not take aspirin or any aspirin-containing medications, at least eleven (11) days prior to the procedure.  They may prolong bleeding.  Wear loose fitting clothing that may be easy to take off and that you would not mind if it got stained with Betadine or blood.  Do not  wear any jewelry or perfume  Remove any nail coloring.  It will interfere with some of our monitoring equipment.  NOTE: Remember that this is not meant to be interpreted as a complete list of all possible complications.  Unforeseen problems may occur.  BLOOD THINNERS The following drugs contain aspirin or other products, which can cause increased bleeding during surgery and should not be taken for 2 weeks prior to and 1 week after surgery.  If you should need take something for relief of minor pain, you may take acetaminophen which is found in Tylenol,m Datril, Anacin-3 and Panadol. It is not blood thinner. The products listed below are.  Do not take any of the products listed below in addition to any listed on your instruction sheet.  A.P.C or A.P.C with Codeine Codeine Phosphate Capsules #3 Ibuprofen Ridaura  ABC compound Congesprin Imuran rimadil  Advil Cope Indocin Robaxisal  Alka-Seltzer Effervescent Pain Reliever and Antacid Coricidin or Coricidin-D  Indomethacin Rufen  Alka-Seltzer plus Cold Medicine Cosprin Ketoprofen S-A-C Tablets  Anacin Analgesic Tablets or Capsules Coumadin Korlgesic Salflex  Anacin Extra Strength Analgesic tablets or capsules CP-2 Tablets Lanoril Salicylate  Anaprox Cuprimine Capsules Levenox Salocol  Anexsia-D Dalteparin Magan Salsalate  Anodynos Darvon compound Magnesium Salicylate Sine-off  Ansaid Dasin Capsules Magsal Sodium Salicylate  Anturane Depen Capsules Marnal Soma  APF Arthritis pain formula Dewitt's Pills Measurin Stanback  Argesic Dia-Gesic Meclofenamic Sulfinpyrazone  Arthritis Bayer Timed Release Aspirin Diclofenac Meclomen Sulindac   Arthritis pain formula Anacin Dicumarol Medipren Supac  Analgesic (Safety coated) Arthralgen Diffunasal Mefanamic Suprofen  Arthritis Strength Bufferin Dihydrocodeine Mepro Compound Suprol  Arthropan liquid Dopirydamole Methcarbomol with Aspirin Synalgos  ASA tablets/Enseals Disalcid Micrainin Tagament  Ascriptin Doan's Midol Talwin  Ascriptin A/D Dolene Mobidin Tanderil  Ascriptin Extra Strength Dolobid Moblgesic Ticlid  Ascriptin with Codeine Doloprin or Doloprin with Codeine Momentum Tolectin  Asperbuf Duoprin Mono-gesic Trendar  Aspergum Duradyne Motrin or Motrin IB Triminicin  Aspirin plain, buffered or enteric coated Durasal Myochrisine Trigesic  Aspirin Suppositories Easprin Nalfon Trillsate  Aspirin with Codeine Ecotrin Regular or Extra Strength Naprosyn Uracel  Atromid-S Efficin Naproxen Ursinus  Auranofin Capsules Elmiron Neocylate Vanquish  Axotal Emagrin Norgesic Verin  Azathioprine Empirin or Empirin with Codeine Normiflo Vitamin E  Azolid Emprazil Nuprin Voltaren  Bayer Aspirin plain, buffered or children's or timed BC Tablets or powders Encaprin Orgaran Warfarin Sodium  Buff-a-Comp Enoxaparin Orudis Zorpin  Buff-a-Comp with Codeine Equegesic Os-Cal-Gesic   Buffaprin Excedrin plain, buffered or Extra Strength Oxalid   Bufferin Arthritis Strength Feldene Oxphenbutazone   Bufferin plain or Extra Strength Feldene Capsules Oxycodone with Aspirin   Bufferin with Codeine Fenoprofen Fenoprofen Pabalate or Pabalate-SF   Buffets II Flogesic Panagesic   Buffinol plain or Extra Strength Florinal or Florinal with Codeine Panwarfarin   Buf-Tabs Flurbiprofen Penicillamine   Butalbital Compound Four-way cold tablets Penicillin   Butazolidin Fragmin Pepto-Bismol   Carbenicillin Geminisyn Percodan   Carna Arthritis Reliever Geopen Persantine   Carprofen Gold's salt Persistin   Chloramphenicol Goody's Phenylbutazone   Chloromycetin Haltrain Piroxlcam   Clmetidine heparin Plaquenil    Cllnoril Hyco-pap Ponstel   Clofibrate Hydroxy chloroquine Propoxyphen         Before stopping any of these medications, be sure to consult the physician who ordered them.  Some, such as Coumadin (Warfarin) are ordered to prevent or treat serious conditions such as "deep thrombosis", "pumonary embolisms", and other heart problems.  The amount of time that you may need off of the medication may  also vary with the medication and the reason for which you were taking it.  If you are taking any of these medications, please make sure you notify your pain physician before you undergo any procedures.         Facet Blocks Patient Information  Description: The facets are joints in the spine between the vertebrae.  Like any joints in the body, facets can become irritated and painful.  Arthritis can also effect the facets.  By injecting steroids and local anesthetic in and around these joints, we can temporarily block the nerve supply to them.  Steroids act directly on irritated nerves and tissues to reduce selling and inflammation which often leads to decreased pain.  Facet blocks may be done anywhere along the spine from the neck to the low back depending upon the location of your pain.   After numbing the skin with local anesthetic (like Novocaine), a small needle is passed onto the facet joints under x-ray guidance.  You may experience a sensation of pressure while this is being done.  The entire block usually lasts about 15-25 minutes.   Conditions which may be treated by facet blocks:   Low back/buttock pain  Neck/shoulder pain  Certain types of headaches  Preparation for the injection:  1. Do not eat any solid food or dairy products within 8 hours of your appointment. 2. You may drink clear liquid up to 3 hours before appointment.  Clear liquids include water, black coffee, juice or soda.  No milk or cream please. 3. You may take your regular medication, including pain medications, with  a sip of water before your appointment.  Diabetics should hold regular insulin (if taken separately) and take 1/2 normal NPH dose the morning of the procedure.  Carry some sugar containing items with you to your appointment. 4. A driver must accompany you and be prepared to drive you home after your procedure. 5. Bring all your current medications with you. 6. An IV may be inserted and sedation may be given at the discretion of the physician. 7. A blood pressure cuff, EKG and other monitors will often be applied during the procedure.  Some patients may need to have extra oxygen administered for a short period. 8. You will be asked to provide medical information, including your allergies and medications, prior to the procedure.  We must know immediately if you are taking blood thinners (like Coumadin/Warfarin) or if you are allergic to IV iodine contrast (dye).  We must know if you could possible be pregnant.  Possible side-effects:   Bleeding from needle site  Infection (rare, may require surgery)  Nerve injury (rare)  Numbness & tingling (temporary)  Difficulty urinating (rare, temporary)  Spinal headache (a headache worse with upright posture)  Light-headedness (temporary)  Pain at injection site (serveral days)  Decreased blood pressure (rare, temporary)  Weakness in arm/leg (temporary)  Pressure sensation in back/neck (temporary)   Call if you experience:   Fever/chills associated with headache or increased back/neck pain  Headache worsened by an upright position  New onset, weakness or numbness of an extremity below the injection site  Hives or difficulty breathing (go to the emergency room)  Inflammation or drainage at the injection site(s)  Severe back/neck pain greater than usual  New symptoms which are concerning to you  Please note:  Although the local anesthetic injected can often make your back or neck feel good for several hours after the injection,  the pain will likely return. It  takes 3-7 days for steroids to work.  You may not notice any pain relief for at least one week.  If effective, we will often do a series of 2-3 injections spaced 3-6 weeks apart to maximally decrease your pain.  After the initial series, you may be a candidate for a more permanent nerve block of the facets.  If you have any questions, please call #[phone] [facility]

## 2016-10-31 ENCOUNTER — Telehealth: Payer: Self-pay

## 2016-10-31 NOTE — Telephone Encounter (Signed)
POst procedure phone call.  States she is sore.  Instructed to put heat on it today and to call us for any further questions or concerns.

## 2016-11-05 ENCOUNTER — Telehealth: Payer: Self-pay

## 2016-11-05 NOTE — Telephone Encounter (Signed)
Patient called to reschedule her mammogram. She is very sick and also having to deal with some family issues. The patient is rescheduled for a Colonoscopy at [facility] on 02/05/17. They are aware to call the day before to get their arrival time. Miralax prescription has been sent into the patient's pharmacy. She will make use of Dulcolax 5 mg tablet, 2 the AM and 2 the PM 2 days prior to procedure.  The patient is aware of date and instructions.

## 2016-11-25 ENCOUNTER — Encounter: Payer: Self-pay | Admitting: Pain Medicine

## 2016-11-25 ENCOUNTER — Ambulatory Visit: Payer: Medicaid Other | Attending: Pain Medicine | Admitting: Pain Medicine

## 2016-11-25 VITALS — BP 145/98 | HR 112 | Temp 97.7°F | Resp 18 | Ht 65.0 in | Wt 248.0 lb

## 2016-11-25 DIAGNOSIS — Z96611 Presence of right artificial shoulder joint: Secondary | ICD-10-CM | POA: Insufficient documentation

## 2016-11-25 DIAGNOSIS — Z888 Allergy status to other drugs, medicaments and biological substances status: Secondary | ICD-10-CM | POA: Insufficient documentation

## 2016-11-25 DIAGNOSIS — Z9889 Other specified postprocedural states: Secondary | ICD-10-CM | POA: Insufficient documentation

## 2016-11-25 DIAGNOSIS — Z90711 Acquired absence of uterus with remaining cervical stump: Secondary | ICD-10-CM | POA: Diagnosis not present

## 2016-11-25 DIAGNOSIS — F329 Major depressive disorder, single episode, unspecified: Secondary | ICD-10-CM | POA: Diagnosis not present

## 2016-11-25 DIAGNOSIS — Z885 Allergy status to narcotic agent status: Secondary | ICD-10-CM | POA: Insufficient documentation

## 2016-11-25 DIAGNOSIS — M25512 Pain in left shoulder: Secondary | ICD-10-CM

## 2016-11-25 DIAGNOSIS — F419 Anxiety disorder, unspecified: Secondary | ICD-10-CM | POA: Diagnosis not present

## 2016-11-25 DIAGNOSIS — Z881 Allergy status to other antibiotic agents status: Secondary | ICD-10-CM | POA: Insufficient documentation

## 2016-11-25 DIAGNOSIS — Z79891 Long term (current) use of opiate analgesic: Secondary | ICD-10-CM | POA: Insufficient documentation

## 2016-11-25 DIAGNOSIS — G8929 Other chronic pain: Secondary | ICD-10-CM

## 2016-11-25 DIAGNOSIS — K589 Irritable bowel syndrome without diarrhea: Secondary | ICD-10-CM | POA: Diagnosis not present

## 2016-11-25 DIAGNOSIS — M25559 Pain in unspecified hip: Secondary | ICD-10-CM

## 2016-11-25 DIAGNOSIS — Z8249 Family history of ischemic heart disease and other diseases of the circulatory system: Secondary | ICD-10-CM | POA: Insufficient documentation

## 2016-11-25 DIAGNOSIS — J449 Chronic obstructive pulmonary disease, unspecified: Secondary | ICD-10-CM | POA: Diagnosis not present

## 2016-11-25 DIAGNOSIS — M4696 Unspecified inflammatory spondylopathy, lumbar region: Secondary | ICD-10-CM | POA: Insufficient documentation

## 2016-11-25 DIAGNOSIS — Z6841 Body Mass Index (BMI) 40.0 and over, adult: Secondary | ICD-10-CM | POA: Insufficient documentation

## 2016-11-25 DIAGNOSIS — M797 Fibromyalgia: Secondary | ICD-10-CM | POA: Diagnosis not present

## 2016-11-25 DIAGNOSIS — K449 Diaphragmatic hernia without obstruction or gangrene: Secondary | ICD-10-CM | POA: Diagnosis not present

## 2016-11-25 DIAGNOSIS — M5442 Lumbago with sciatica, left side: Secondary | ICD-10-CM | POA: Insufficient documentation

## 2016-11-25 DIAGNOSIS — Z886 Allergy status to analgesic agent status: Secondary | ICD-10-CM | POA: Insufficient documentation

## 2016-11-25 DIAGNOSIS — Z91041 Radiographic dye allergy status: Secondary | ICD-10-CM | POA: Insufficient documentation

## 2016-11-25 DIAGNOSIS — F17210 Nicotine dependence, cigarettes, uncomplicated: Secondary | ICD-10-CM | POA: Insufficient documentation

## 2016-11-25 DIAGNOSIS — G510 Bell's palsy: Secondary | ICD-10-CM | POA: Insufficient documentation

## 2016-11-25 DIAGNOSIS — Z96612 Presence of left artificial shoulder joint: Secondary | ICD-10-CM | POA: Insufficient documentation

## 2016-11-25 DIAGNOSIS — M5441 Lumbago with sciatica, right side: Secondary | ICD-10-CM | POA: Diagnosis not present

## 2016-11-25 DIAGNOSIS — M25511 Pain in right shoulder: Secondary | ICD-10-CM | POA: Diagnosis not present

## 2016-11-25 DIAGNOSIS — Z981 Arthrodesis status: Secondary | ICD-10-CM | POA: Insufficient documentation

## 2016-11-25 DIAGNOSIS — Z809 Family history of malignant neoplasm, unspecified: Secondary | ICD-10-CM | POA: Insufficient documentation

## 2016-11-25 DIAGNOSIS — G894 Chronic pain syndrome: Secondary | ICD-10-CM | POA: Diagnosis not present

## 2016-11-25 DIAGNOSIS — I10 Essential (primary) hypertension: Secondary | ICD-10-CM | POA: Diagnosis not present

## 2016-11-25 DIAGNOSIS — M47816 Spondylosis without myelopathy or radiculopathy, lumbar region: Secondary | ICD-10-CM

## 2016-11-25 DIAGNOSIS — Z91048 Other nonmedicinal substance allergy status: Secondary | ICD-10-CM | POA: Insufficient documentation

## 2016-11-25 DIAGNOSIS — E785 Hyperlipidemia, unspecified: Secondary | ICD-10-CM | POA: Insufficient documentation

## 2016-11-25 NOTE — Progress Notes (Signed)
Safety precautions to be maintained throughout the outpatient stay will include: orient to surroundings, keep bed in low position, maintain call bell within reach at all times, provide assistance with transfer out of bed and ambulation.  

## 2016-11-25 NOTE — Patient Instructions (Addendum)
Pre procedure instructions given.  Do not eat or drink for 8 hours , bring a driver,    Steps to Quit Smoking Smoking tobacco can be bad for your health. It can also affect almost every organ in your body. Smoking puts you and people around you at risk for many serious long-lasting (chronic) diseases. Quitting smoking is hard, but it is one of the best things that you can do for your health. It is never too late to quit. What are the benefits of quitting smoking? When you quit smoking, you lower your risk for getting serious diseases and conditions. They can include:  Lung cancer or lung disease.  Heart disease.  Stroke.  Heart attack.  Not being able to have children (infertility).  Weak bones (osteoporosis) and broken bones (fractures). If you have coughing, wheezing, and shortness of breath, those symptoms may get better when you quit. You may also get sick less often. If you are pregnant, quitting smoking can help to lower your chances of having a baby of low birth weight. What can I do to help me quit smoking? Talk with your doctor about what can help you quit smoking. Some things you can do (strategies) include:  Quitting smoking totally, instead of slowly cutting back how much you smoke over a period of time.  Going to in-person counseling. You are more likely to quit if you go to many counseling sessions.  Using resources and support systems, such as:  Online chats with a Social worker.  Phone quitlines.  Printed Furniture conservator/restorer.  Support groups or group counseling.  Text messaging programs.  Mobile phone apps or applications.  Taking medicines. Some of these medicines may have nicotine in them. If you are pregnant or breastfeeding, do not take any medicines to quit smoking unless your doctor says it is okay. Talk with your doctor about counseling or other things that can help you. Talk with your doctor about using more than one strategy at the same time, such as  taking medicines while you are also going to in-person counseling. This can help make quitting easier. What things can I do to make it easier to quit? Quitting smoking might feel very hard at first, but there is a lot that you can do to make it easier. Take these steps:  Talk to your family and friends. Ask them to support and encourage you.  Call phone quitlines, reach out to support groups, or work with a Social worker.  Ask people who smoke to not smoke around you.  Avoid places that make you want (trigger) to smoke, such as:  Bars.  Parties.  Smoke-break areas at work.  Spend time with people who do not smoke.  Lower the stress in your life. Stress can make you want to smoke. Try these things to help your stress:  Getting regular exercise.  Deep-breathing exercises.  Yoga.  Meditating.  Doing a body scan. To do this, close your eyes, focus on one area of your body at a time from head to toe, and notice which parts of your body are tense. Try to relax the muscles in those areas.  Download or buy apps on your mobile phone or tablet that can help you stick to your quit plan. There are many free apps, such as QuitGuide from the [employer] manager for Disease Control and Prevention). You can find more support from smokefree.gov and other websites. This information is not intended to replace advice given to you by your health care provider.  Make sure you discuss any questions you have with your health care provider. Document Released: 04/27/2009 Document Revised: 02/27/2016 Document Reviewed: 11/15/2014 Elsevier Interactive Patient Education  2017 [place].  _______________________________________________________________  Preparing for Procedure with Sedation Instructions: . Oral Intake: Do not eat or drink anything for at least 8 hours prior to your procedure. . Transportation: Public transportation is not allowed. Bring an adult driver. The driver must be physically present in  our waiting room before any procedure can be started. [facility] Physical Assistance: Bring an adult physically capable of assisting you, in the event you need help. This adult should keep you company at home for at least 6 hours after the procedure. . Blood Pressure Medicine: Take your blood pressure medicine with a sip of water the morning of the procedure. . Blood thinners:  . Diabetics on insulin: Notify the staff so that you can be scheduled 1st case in the morning. If your diabetes requires high dose insulin, take only  of your normal insulin dose the morning of the procedure and notify the staff that you have done so. . Preventing infections: Shower with an antibacterial soap the morning of your procedure. . Build-up your immune system: Take 1000 mg of Vitamin C with every meal (3 times a day) the day prior to your procedure. [facility] Kitchen Antibiotics: Inform the staff if you have a condition or reason that requires you to take antibiotics before dental procedures. . Pregnancy: If you are pregnant, call and cancel the procedure. . Sickness: If you have a cold, fever, or any active infections, call and cancel the procedure. . Arrival: You must be in the facility at least 30 minutes prior to your scheduled procedure. . Children: Do not bring children with you. . Dress appropriately: Bring dark clothing that you would not mind if they get stained. . Valuables: Do not bring any jewelry or valuables. Procedure appointments are reserved for interventional treatments only. Mar[facility] Kitchen No Prescription Refills. . No medication changes will be discussed during procedure appointments. . No disability issues will be discussed. ______________________________________________________________________________________________  Preparing for Procedure with Sedation Instructions: . Oral Intake: Do not eat or drink anything for at least 8 hours prior to your procedure. . Transportation: Public transportation is not allowed. Bring an  adult driver. The driver must be physically present in our waiting room before any procedure can be started. [facility] Kitchen Physical Assistance: Bring an adult capable of physically assisting you, in the event you need help. . Blood Pressure Medicine: Take your blood pressure medicine with a sip of water the morning of the procedure. . Insulin: Take only  of your normal insulin dose. . Preventing infections: Shower with an antibacterial soap the morning of your procedure. . Build-up your immune system: Take 1000 mg of Vitamin C with every meal (3 times a day) the day prior to your procedure. . Pregnancy: If you are pregnant, call and cancel the procedure. . Sickness: If you have a cold, fever, or any active infections, call and cancel the procedure. . Arrival: You must be in the facility at least 30 minutes prior to your scheduled procedure. . Children: Do not bring children with you. . Dress appropriately: Bring dark clothing that you would not mind if they get stained. . Valuables: Do not bring any jewelry or valuables. Procedure appointments are reserved for interventional treatments only. Marland Kitchen No Prescription Refills. . No medication changes will be discussed during procedure appointments. No disability issues will be discussed.Radiofrequency Lesioning Radiofrequency lesioning is a procedure  that is performed to relieve pain. The procedure is often used for back, neck, or arm pain. Radiofrequency lesioning involves the use of a machine that creates radio waves to make heat. During the procedure, the heat is applied to the nerve that carries the pain signal. The heat damages the nerve and interferes with the pain signal. Pain relief usually starts about 2 weeks after the procedure and lasts for 6 months to 1 year. Tell a health care provider about:  Any allergies you have.  All medicines you are taking, including vitamins, herbs, eye drops, creams, and over-the-counter medicines.  Any problems you or  family members have had with anesthetic medicines.  Any blood disorders you have.  Any surgeries you have had.  Any medical conditions you have.  Whether you are pregnant or may be pregnant. What are the risks? Generally, this is a safe procedure. However, problems may occur, including:  Pain or soreness at the injection site.  Infection at the injection site.  Damage to nerves or blood vessels. What happens before the procedure?  Ask your health care provider about:  Changing or stopping your regular medicines. This is especially important if you are taking diabetes medicines or blood thinners.  Taking medicines such as aspirin and ibuprofen. These medicines can thin your blood. Do not take these medicines before your procedure if your health care provider instructs you not to.  Follow instructions from your health care provider about eating or drinking restrictions.  Plan to have someone take you home after the procedure.  If you go home right after the procedure, plan to have someone with you for 24 hours. What happens during the procedure?  You will be given one or more of the following:  A medicine to help you relax (sedative).  A medicine to numb the area (local anesthetic).  You will be awake during the procedure. You will need to be able to talk with the health care provider during the procedure.  With the help of a type of X-ray (fluoroscopy), the health care provider will insert a radiofrequency needle into the area to be treated.  Next, a wire that carries the radio waves (electrode) will be put through the radiofrequency needle. An electrical pulse will be sent through the electrode to verify the correct nerve. You will feel a tingling sensation, and you may have muscle twitching.  Then, the tissue that is around the needle tip will be heated by an electric current that is passed using the radiofrequency machine. This will numb the nerves.  A bandage  (dressing) will be put on the insertion area after the procedure is done. The procedure may vary among health care providers and hospitals. What happens after the procedure?  Your blood pressure, heart rate, breathing rate, and blood oxygen level will be monitored often until the medicines you were given have worn off.  Return to your normal activities as directed by your health care provider. This information is not intended to replace advice given to you by your health care provider. Make sure you discuss any questions you have with your health care provider. Document Released: 02/27/2011 Document Revised: 12/07/2015 Document Reviewed: 08/08/2014 Elsevier Interactive Patient Education  2017 [employer].

## 2016-11-25 NOTE — Progress Notes (Signed)
Patient's Name: Danielle [name]  MRN: [id number]  Referring Provider: Marygrace Drought, MD  DOB: 02-24-1961  PCP: Marygrace Drought, MD  DOS: 11/25/2016  Note by: Kathlen Brunswick. [name], MD  Service setting: Ambulatory outpatient  Specialty: Interventional Pain Management  Location: [facility] (AMB) Pain Management Facility    Patient type: Established   Primary Reason(s) for Visit: Encounter for post-procedure evaluation of chronic illness with mild to moderate exacerbation CC: Hip Pain (right) and Shoulder Pain (bilateral)  HPI  Danielle [name] is a 56 y.o. year old, female patient, who comes today for a post-procedure evaluation. She has Long term current use of opiate analgesic; Long term prescription opiate use; Opiate use (60 MME/Day); B12 deficiency; Cervical spinal cord compression [facility]) (April, 2017); Cervical spinal stenosis; Chronic obstructive pulmonary disease ([place]); Clinical depression; BP (high blood pressure); Adult hypothyroidism; Adiposity; Mucositis oral; Groin pain; Current tobacco use; Chronic shoulder pain (Location of Primary Source of Pain) (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); History of total shoulder replacement (Bilateral); Chronic hip pain (Location of Secondary source of pain) (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); 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); Chronic low back pain (Location of Tertiary source of pain) (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); Lumbar facet syndrome (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); Lumbar spondylosis; Failed back surgical syndrome; Chronic lower extremity pain (Right); Chronic neck pain (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); Chronic cervical radicular pain (Right); Ulnar neuropathy of left upper extremity; Hx of cervical spine surgery; Cervical spondylosis; Encounter for therapeutic drug level monitoring; Encounter for pain management planning; Fibromyalgia; Chronic sacroiliac pain (B) (R>L); Seasonal allergic rhinitis; Status post total replacement of right shoulder; Depression; Hypertension; Hypothyroid; Chronic pain syndrome; Complaints of weakness of  lower extremity; Morbid obesity with BMI of 40.0-44.9, adult ([place]); Anterolisthesis; Cervical facet syndrome ([place]); Chronic musculoskeletal pain; Muscle spasm of back; Encounter for screening colonoscopy; Rectal bleeding; and Mixed simple and mucopurulent chronic bronchitis (HCC) on her problem list. Her primarily concern today is the Hip Pain (right) and Shoulder Pain (bilateral)  Pain Assessment: Self-Reported Pain Score: 5 /10             Reported level is compatible with observation.          Danielle [name] comes in today for post-procedure evaluation after the treatment done on 10/30/2016.  Further details on both, my assessment(s), as well as the proposed treatment plan, please see below.  Post-Procedure Assessment  10/30/2016 Procedure: Diagnostic bilateral lumbar facet block under fluoroscopic guidance and IV sedation #2 Pre-procedure pain score:  4/10 Post-procedure pain score: 0/10 (100% relief) Influential Factors: BMI: 41.27 kg/m Intra-procedural challenges: None observed Assessment challenges: None detected         Post-procedural side-effects, adverse reactions, or complications: None reported Reported issues: None  Sedation: Sedation provided. When no sedatives are used, the analgesic levels obtained are directly associated to the effectiveness of the local anesthetics. However, when sedation is provided, the level of analgesia obtained during the initial 1 hour following the intervention, is believed to be the result of a combination of factors. These factors may include, but are not limited to: 1. The effectiveness of the local anesthetics used. 2. The effects of the analgesic(s) and/or anxiolytic(s) used. 3. The degree of discomfort experienced by the patient at the time of the procedure. 4. The patients ability and reliability in recalling and recording the events. 5. The presence and influence of possible secondary gains and/or psychosocial factors. Reported result: Relief  experienced during the 1st hour after the procedure: 100 % (Ultra-Short Term Relief) Interpretative annotation: Analgesia during this period is likely to be Local Anesthetic  and/or IV Sedative (Analgesic/Anxiolitic) related.          Effects of local anesthetic: The analgesic effects attained during this period are directly associated to the localized infiltration of local anesthetics and therefore cary significant diagnostic value as to the etiological location, or anatomical origin, of the pain. Expected duration of relief is directly dependent on the pharmacodynamics of the local anesthetic used. Long-acting (4-6 hours) anesthetics used.  Reported result: Relief during the next 4 to 6 hour after the procedure: 100 % (Short-Term Relief) Interpretative annotation: Complete relief would suggest area to be the source of the pain.          Long-term benefit: Defined as the period of time past the expected duration of local anesthetics. With the possible exception of prolonged sympathetic blockade from the local anesthetics, benefits during this period are typically attributed to, or associated with, other factors such as analgesic sensory neuropraxia, antiinflammatory effects, or beneficial biochemical changes provided by agents other than the local anesthetics Reported result: Extended relief following procedure: 25 % (ongoing) (Long-Term Relief) Interpretative annotation: Partial relief. This could suggest the algesic mechanism to be a combination of tissue inflammation and mechanical problems.          Current benefits: Defined as persistent relief that continues at this point in time.   Reported results: Treated area: 25 % Ms. Ungerer reports improvement in function Interpretative annotation: Recurrance of symptoms. This would suggest persistent aggravating factors  Interpretation: Results would suggest a successful diagnostic intervention. The patient has failed to respond to conservative therapies  including over-the-counter medications, anti-inflammatories, muscle relaxants, membrane stabilizers, opioids, physical therapy, modalities such as heat and ice, as well as more invasive techniques such as nerve blocks. Because Ms. Boerner did attain more than 50% relief of the pain during a series of diagnostic blocks conducted in separate occasions, I believe it is medically necessary to proceed with Radiofrequency Ablation, in order to attempt gaining longer relief.  Laboratory Chemistry  Inflammation Markers Lab Results  Component Value Date   CRP 0.9 01/08/2016   ESRSEDRATE 37 (H) 01/08/2016   (CRP: Acute Phase) (ESR: Chronic Phase) Renal Function Markers Lab Results  Component Value Date   BUN 8 01/08/2016   CREATININE 0.94 01/08/2016   GFRAA >60 01/08/2016   GFRNONAA >60 01/08/2016   Hepatic Function Markers Lab Results  Component Value Date   AST 18 01/08/2016   ALT 14 01/08/2016   ALBUMIN 3.7 01/08/2016   ALKPHOS 79 01/08/2016   Electrolytes Lab Results  Component Value Date   NA 137 01/08/2016   K 3.5 01/08/2016   CL 101 01/08/2016   CALCIUM 9.0 01/08/2016   MG 2.1 01/08/2016   Neuropathy Markers Lab Results  Component Value Date   VITAMINB12 >7,500 (H) 01/08/2016   Bone Pathology Markers Lab Results  Component Value Date   ALKPHOS 79 01/08/2016   25OHVITD1 32 01/08/2016   25OHVITD2 2.0 01/08/2016   25OHVITD3 30 01/08/2016   CALCIUM 9.0 01/08/2016   Coagulation Parameters No results found for: INR, LABPROT, APTT, PLT Cardiovascular Markers No results found for: BNP, HGB, HCT Note: Lab results reviewed.  Recent Diagnostic Imaging Review  Dg C-arm 1-60 Min-no Report  Result Date: 10/30/2016 Fluoroscopy was utilized by the requesting physician.  No radiographic interpretation.   Note: Imaging results reviewed.          Meds  The patient has a current medication list which includes the following prescription(s): albuterol, cetirizine, vitamin d3,  citalopram, cyanocobalamin,  cyclobenzaprine, diclofenac sodium, docusate sodium, levothyroxine, loratadine, magnesium hydroxide, naloxone, nystatin, oxycodone hcl, oxycodone hcl, oxycodone hcl, polyethylene glycol powder, and spiriva handihaler.  Current Outpatient Prescriptions on File Prior to Visit  Medication Sig  . albuterol (PROVENTIL HFA;VENTOLIN HFA) 108 (90 Base) MCG/ACT inhaler Inhale 2 puffs into the lungs every 4 (four) hours as needed for wheezing or shortness of breath.   . cetirizine (ZYRTEC) 10 MG tablet Take 10 mg by mouth daily.   . Cholecalciferol (VITAMIN D3) 2000 units capsule Take 2,000 Units by mouth daily.   . citalopram (CELEXA) 40 MG tablet Take 40 mg by mouth daily.  . cyanocobalamin (,VITAMIN B-12,) 1000 MCG/ML injection Inject into the muscle.  . cyclobenzaprine (FLEXERIL) 10 MG tablet Take 1 tablet (10 mg total) by mouth 3 (three) times daily as needed for muscle spasms.  . diclofenac sodium (VOLTAREN) 1 % GEL Apply 2 g topically 4 (four) times daily.   Marland Kitchen docusate sodium (COLACE) 100 MG capsule Take 100 mg by mouth 2 (two) times daily.  Marland Kitchen levothyroxine (SYNTHROID, LEVOTHROID) 50 MCG tablet TAKE 1 TABLET (50 MCG TOTAL) BY MOUTH DAILY AT 0600.  Marland Kitchen loratadine (CLARITIN) 10 MG tablet TAKE 1 TABLET (10 MG TOTAL) BY MOUTH DAILY.  . magnesium hydroxide (MILK OF MAGNESIA) 400 MG/5ML suspension Take by mouth.  . naloxone (NARCAN) 2 MG/2ML injection Inject content of syringe into thigh muscle. Call 911.  . nystatin (MYCOSTATIN/NYSTOP) powder Apply topically.  . Oxycodone HCl 10 MG TABS Take 1 tablet (10 mg total) by mouth every 6 (six) hours as needed.  Derrill Memo ON 11/30/2016] Oxycodone HCl 10 MG TABS Take 1 tablet (10 mg total) by mouth every 6 (six) hours as needed.  Derrill Memo ON 12/30/2016] Oxycodone HCl 10 MG TABS Take 1 tablet (10 mg total) by mouth every 6 (six) hours as needed.  . polyethylene glycol powder (GLYCOLAX/MIRALAX) powder 255 grams one bottle for colonoscopy prep   . SPIRIVA HANDIHALER 18 MCG inhalation capsule PLACE 1 CAPSULE (18 MCG TOTAL) INTO INHALER AND INHALE ONCE DAILY.   No current facility-administered medications on file prior to visit.    ROS  Constitutional: Denies any fever or chills Gastrointestinal: No reported hemesis, hematochezia, vomiting, or acute GI distress Musculoskeletal: Denies any acute onset joint swelling, redness, loss of ROM, or weakness Neurological: No reported episodes of acute onset apraxia, aphasia, dysarthria, agnosia, amnesia, paralysis, loss of coordination, or loss of consciousness  Allergies  Ms. Cast is allergic to aripiprazole; duloxetine; gabapentin; gold; iodinated diagnostic agents; naproxen sodium; nortriptyline hcl; nsaids; pregabalin; ace inhibitors; aspirin; cefpodoxime; fluoxetine; fluticasone-salmeterol; levofloxacin; lithium; meperidine; paroxetine; tape; telithromycin; theophylline; topiramate; trazodone; triamcinolone; venlafaxine; bupropion; ketorolac tromethamine; morphine; and moxifloxacin.  [place]  Drug: Ms. Terhaar  reports that she does not use drugs. Alcohol:  reports that she does not drink alcohol. Tobacco:  reports that she has been smoking Cigarettes.  She has a 40.00 pack-year smoking history. She has never used smokeless tobacco. Medical:  has a past medical history of Anxiety; Arthritis; Asthma; Bell's palsy; Bursitis; COPD (chronic obstructive pulmonary disease) ([place]); Depression; Fibromyalgia; Heart murmur; Hepatitis C; Hiatal hernia; Hyperlipidemia; Hypertension; IBS (irritable bowel syndrome); Insomnia; and Thyroid disease. Family: family history includes Cancer in her mother; Gout in her mother; Heart disease in her father.  Past Surgical History:  Procedure Laterality Date  . BACK SURGERY  2013   lumbar disk  . CARPAL TUNNEL RELEASE Right   . ELBOW SURGERY Left   . FOOT SURGERY Right   .  necksurgery  10/21/2014   spine neck fusion  . NOSE SURGERY    . PARTIAL HYSTERECTOMY     . TOTAL SHOULDER REPLACEMENT Bilateral    Constitutional Exam  General appearance: Well nourished, well developed, and well hydrated. In no apparent acute distress Vitals:   11/25/16 1330  BP: (!) 145/98  Pulse: (!) 112  Resp: 18  Temp: 97.7 F (36.5 C)  SpO2: 99%  Weight: 248 lb (112.5 kg)  Height: _0  (1.651 m)   BMI Assessment: Estimated body mass index is 41.27 kg/m as calculated from the following:   Height as of this encounter: _1  (1.651 m).   Weight as of this encounter: 248 lb (112.5 kg).  BMI interpretation table: BMI level Category Range association with higher incidence of chronic pain  <18 kg/m2 Underweight   18.5-24.9 kg/m2 Ideal body weight   25-29.9 kg/m2 Overweight Increased incidence by 20%  30-34.9 kg/m2 Obese (Class I) Increased incidence by 68%  35-39.9 kg/m2 Severe obesity (Class II) Increased incidence by 136%  >40 kg/m2 Extreme obesity (Class III) Increased incidence by 254%   BMI Readings from Last 4 Encounters:  11/25/16 41.27 kg/m  10/30/16 40.10 kg/m  10/24/16 39.94 kg/m  09/30/16 40.94 kg/m   Wt Readings from Last 4 Encounters:  11/25/16 248 lb (112.5 kg)  10/30/16 241 lb (109.3 kg)  10/24/16 240 lb (108.9 kg)  09/30/16 246 lb (111.6 kg)  Psych/Mental status: Alert, oriented x 3 (person, place, & time)       Eyes: PERLA Respiratory: No evidence of acute respiratory distress  Cervical Spine Exam  Inspection: No masses, redness, or swelling Alignment: Symmetrical Functional ROM: Unrestricted ROM      Stability: No instability detected Muscle strength & Tone: Functionally intact Sensory: Unimpaired Palpation: No palpable anomalies              Upper Extremity (UE) Exam    Side: Right upper extremity  Side: Left upper extremity  Inspection: No masses, redness, swelling, or asymmetry. No contractures  Inspection: No masses, redness, swelling, or asymmetry. No contractures  Functional ROM: Unrestricted ROM          Functional  ROM: Unrestricted ROM          Muscle strength & Tone: Functionally intact  Muscle strength & Tone: Functionally intact  Sensory: Unimpaired  Sensory: Unimpaired  Palpation: No palpable anomalies              Palpation: No palpable anomalies              Specialized Test(s): Deferred         Specialized Test(s): Deferred          Thoracic Spine Exam  Inspection: No masses, redness, or swelling Alignment: Symmetrical Functional ROM: Unrestricted ROM Stability: No instability detected Sensory: Unimpaired Muscle strength & Tone: No palpable anomalies  Lumbar Spine Exam  Inspection: No masses, redness, or swelling Alignment: Symmetrical Functional ROM: Limited ROM      Stability: No instability detected Muscle strength & Tone: Functionally intact Sensory: Movement-associated pain Palpation: Complains of area being tender to palpation       Provocative Tests: Lumbar Hyperextension and rotation test: Positive bilaterally for facet joint pain. Patrick's Maneuver: evaluation deferred today                    Gait & Posture Assessment  Ambulation: Unassisted Gait: Modified gait pattern (slower gait speed, wider stride width, and longer stance duration) associated  with morbid obesity Posture: WNL   Lower Extremity Exam    Side: Right lower extremity  Side: Left lower extremity  Inspection: No masses, redness, swelling, or asymmetry. No contractures  Inspection: No masses, redness, swelling, or asymmetry. No contractures  Functional ROM: Unrestricted ROM          Functional ROM: Unrestricted ROM          Muscle strength & Tone: Functionally intact  Muscle strength & Tone: Functionally intact  Sensory: Unimpaired  Sensory: Unimpaired  Palpation: No palpable anomalies  Palpation: No palpable anomalies   Assessment  Primary Diagnosis & Pertinent Problem List: The primary encounter diagnosis was Lumbar facet syndrome (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). Diagnoses of Chronic low back pain (Location of  Tertiary source of pain) (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), Lumbar spondylosis, Chronic shoulder pain (Location of Primary Source of Pain) (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), and Chronic hip pain, unspecified laterality were also pertinent to this visit.  Status Diagnosis  Persistent Persistent Controlled 1. Lumbar facet syndrome (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)   2. Chronic low back pain (Location of Tertiary source of pain) (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)   3. Lumbar spondylosis   4. Chronic shoulder pain (Location of Primary Source of Pain) (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)   5. Chronic hip pain, unspecified laterality     Problems updated and reviewed during this visit: No problems updated. Plan of Care  Pharmacotherapy (Medications Ordered): No orders of the defined types were placed in this encounter.  New Prescriptions   No medications on file   Medications administered today: Ms. Eddie had no medications administered during this visit. Lab-work, procedure(s), and/or referral(s): Orders Placed This Encounter  Procedures  . Radiofrequency,Lumbar   Imaging and/or referral(s): None  Interventional therapies: Planned, scheduled, and/or pending:   Right Lumbar Facet RFA   Considering:   Palliative bilateralsuprascapular nerve block Possible bilateral suprascapular nerve radiofrequencyablation Diagnostic bilateral intra-articular hip joint injection Possible bilateral hip joint radiofrequencyablation Diagnostic bilateral lumbar facet block Possible bilaterallumbar facet radiofrequencyablation Diagnostic caudal epiduralsteroid injection + diagnostic epidurogram Possible Racz procedure. Diagnostic bilateral cervical facet block Possible bilateral cervical facet radiofrequencyablation Diagnostic right-sided cervical epidural steroid injection Diagnostic bilateral sacroiliac joint block Possible bilateral sacroiliac joint RFA   Palliative PRN treatment(s):   Palliative bilateralsuprascapular nerve block Diagnostic bilateral  intra-articular hip joint injection Diagnostic bilateral lumbar facet block Diagnostic caudal epiduralsteroid injection + diagnostic epidurogram Diagnostic bilateral cervical facet block Diagnostic right-sided cervical epidural steroid injection Diagnostic bilateral sacroiliac joint block   Provider-requested follow-up: Return for procedure (w/ sedation): Right L-Fct RFA, (ASAP), by MD.  Future Appointments Date Time Provider [place]  01/13/2017 10:15 AM Milinda Pointer, MD [facility] None  01/23/2017 1:00 PM Vevelyn Francois, NP [facility] None   Primary Care Physician: Marygrace Drought, MD Location: [facility] Note by: Kathlen Brunswick. [name], M.D, DABA, DABAPM, DABPM, DABIPP, FIPP Date: 11/25/2016; Time: 5:35 PM  Patient instructions provided during this appointment: Patient Instructions  Pre procedure instructions given.  Do not eat or drink for 8 hours , bring a driver,    Steps to Quit Smoking Smoking tobacco can be bad for your health. It can also affect almost every organ in your body. Smoking puts you and people around you at risk for many serious long-lasting (chronic) diseases. Quitting smoking is hard, but it is one of the best things that you can do for your health. It is never too late to quit. What are the benefits of quitting smoking? When you quit smoking, you lower your risk for getting serious diseases  and conditions. They can include:  Lung cancer or lung disease.  Heart disease.  Stroke.  Heart attack.  Not being able to have children (infertility).  Weak bones (osteoporosis) and broken bones (fractures). If you have coughing, wheezing, and shortness of breath, those symptoms may get better when you quit. You may also get sick less often. If you are pregnant, quitting smoking can help to lower your chances of having a baby of low birth weight. What can I do to help me quit smoking? Talk with your doctor about what can  help you quit smoking. Some things you can do (strategies) include:  Quitting smoking totally, instead of slowly cutting back how much you smoke over a period of time.  Going to in-person counseling. You are more likely to quit if you go to many counseling sessions.  Using resources and support systems, such as:  Online chats with a Social worker.  Phone quitlines.  Printed Furniture conservator/restorer.  Support groups or group counseling.  Text messaging programs.  Mobile phone apps or applications.  Taking medicines. Some of these medicines may have nicotine in them. If you are pregnant or breastfeeding, do not take any medicines to quit smoking unless your doctor says it is okay. Talk with your doctor about counseling or other things that can help you. Talk with your doctor about using more than one strategy at the same time, such as taking medicines while you are also going to in-person counseling. This can help make quitting easier. What things can I do to make it easier to quit? Quitting smoking might feel very hard at first, but there is a lot that you can do to make it easier. Take these steps:  Talk to your family and friends. Ask them to support and encourage you.  Call phone quitlines, reach out to support groups, or work with a Social worker.  Ask people who smoke to not smoke around you.  Avoid places that make you want (trigger) to smoke, such as:  Bars.  Parties.  Smoke-break areas at work.  Spend time with people who do not smoke.  Lower the stress in your life. Stress can make you want to smoke. Try these things to help your stress:  Getting regular exercise.  Deep-breathing exercises.  Yoga.  Meditating.  Doing a body scan. To do this, close your eyes, focus on one area of your body at a time from head to toe, and notice which parts of your body are tense. Try to relax the muscles in those areas.  Download or buy apps on your mobile phone or tablet that can help  you stick to your quit plan. There are many free apps, such as QuitGuide from the [employer] Office manager for Disease Control and Prevention). You can find more support from smokefree.gov and other websites. This information is not intended to replace advice given to you by your health care provider. Make sure you discuss any questions you have with your health care provider. Document Released: 04/27/2009 Document Revised: 02/27/2016 Document Reviewed: 11/15/2014 Elsevier Interactive Patient Education  2017 [place].  _______________________________________________________________  Preparing for Procedure with Sedation Instructions: . Oral Intake: Do not eat or drink anything for at least 8 hours prior to your procedure. . Transportation: Public transportation is not allowed. Bring an adult driver. The driver must be physically present in our waiting room before any procedure can be started. Marland Kitchen Physical Assistance: Bring an adult physically capable of assisting you, in the event you need help.  This adult should keep you company at home for at least 6 hours after the procedure. . Blood Pressure Medicine: Take your blood pressure medicine with a sip of water the morning of the procedure. . Blood thinners:  . Diabetics on insulin: Notify the staff so that you can be scheduled 1st case in the morning. If your diabetes requires high dose insulin, take only  of your normal insulin dose the morning of the procedure and notify the staff that you have done so. . Preventing infections: Shower with an antibacterial soap the morning of your procedure. . Build-up your immune system: Take 1000 mg of Vitamin C with every meal (3 times a day) the day prior to your procedure. Marland Kitchen Antibiotics: Inform the staff if you have a condition or reason that requires you to take antibiotics before dental procedures. . Pregnancy: If you are pregnant, call and cancel the procedure. . Sickness: If you have a cold, fever, or any active  infections, call and cancel the procedure. . Arrival: You must be in the facility at least 30 minutes prior to your scheduled procedure. . Children: Do not bring children with you. . Dress appropriately: Bring dark clothing that you would not mind if they get stained. . Valuables: Do not bring any jewelry or valuables. Procedure appointments are reserved for interventional treatments only. Marland Kitchen No Prescription Refills. . No medication changes will be discussed during procedure appointments. . No disability issues will be discussed. ______________________________________________________________________________________________  Preparing for Procedure with Sedation Instructions: . Oral Intake: Do not eat or drink anything for at least 8 hours prior to your procedure. . Transportation: Public transportation is not allowed. Bring an adult driver. The driver must be physically present in our waiting room before any procedure can be started. Marland Kitchen Physical Assistance: Bring an adult capable of physically assisting you, in the event you need help. . Blood Pressure Medicine: Take your blood pressure medicine with a sip of water the morning of the procedure. . Insulin: Take only  of your normal insulin dose. . Preventing infections: Shower with an antibacterial soap the morning of your procedure. . Build-up your immune system: Take 1000 mg of Vitamin C with every meal (3 times a day) the day prior to your procedure. . Pregnancy: If you are pregnant, call and cancel the procedure. . Sickness: If you have a cold, fever, or any active infections, call and cancel the procedure. . Arrival: You must be in the facility at least 30 minutes prior to your scheduled procedure. . Children: Do not bring children with you. . Dress appropriately: Bring dark clothing that you would not mind if they get stained. . Valuables: Do not bring any jewelry or valuables. Procedure appointments are reserved for interventional  treatments only. Marland Kitchen No Prescription Refills. . No medication changes will be discussed during procedure appointments. No disability issues will be discussed.Radiofrequency Lesioning Radiofrequency lesioning is a procedure that is performed to relieve pain. The procedure is often used for back, neck, or arm pain. Radiofrequency lesioning involves the use of a machine that creates radio waves to make heat. During the procedure, the heat is applied to the nerve that carries the pain signal. The heat damages the nerve and interferes with the pain signal. Pain relief usually starts about 2 weeks after the procedure and lasts for 6 months to 1 year. Tell a health care provider about:  Any allergies you have.  All medicines you are taking, including vitamins, herbs, eye drops, creams, and over-the-counter  medicines.  Any problems you or family members have had with anesthetic medicines.  Any blood disorders you have.  Any surgeries you have had.  Any medical conditions you have.  Whether you are pregnant or may be pregnant. What are the risks? Generally, this is a safe procedure. However, problems may occur, including:  Pain or soreness at the injection site.  Infection at the injection site.  Damage to nerves or blood vessels. What happens before the procedure?  Ask your health care provider about:  Changing or stopping your regular medicines. This is especially important if you are taking diabetes medicines or blood thinners.  Taking medicines such as aspirin and ibuprofen. These medicines can thin your blood. Do not take these medicines before your procedure if your health care provider instructs you not to.  Follow instructions from your health care provider about eating or drinking restrictions.  Plan to have someone take you home after the procedure.  If you go home right after the procedure, plan to have someone with you for 24 hours. What happens during the procedure?  You  will be given one or more of the following:  A medicine to help you relax (sedative).  A medicine to numb the area (local anesthetic).  You will be awake during the procedure. You will need to be able to talk with the health care provider during the procedure.  With the help of a type of X-ray (fluoroscopy), the health care provider will insert a radiofrequency needle into the area to be treated.  Next, a wire that carries the radio waves (electrode) will be put through the radiofrequency needle. An electrical pulse will be sent through the electrode to verify the correct nerve. You will feel a tingling sensation, and you may have muscle twitching.  Then, the tissue that is around the needle tip will be heated by an electric current that is passed using the radiofrequency machine. This will numb the nerves.  A bandage (dressing) will be put on the insertion area after the procedure is done. The procedure may vary among health care providers and hospitals. What happens after the procedure?  Your blood pressure, heart rate, breathing rate, and blood oxygen level will be monitored often until the medicines you were given have worn off.  Return to your normal activities as directed by your health care provider. This information is not intended to replace advice given to you by your health care provider. Make sure you discuss any questions you have with your health care provider. Document Released: 02/27/2011 Document Revised: 12/07/2015 Document Reviewed: 08/08/2014 [employer] Interactive Patient Education  2017 [employer].

## 2017-01-13 ENCOUNTER — Encounter: Payer: Self-pay | Admitting: Pain Medicine

## 2017-01-13 ENCOUNTER — Ambulatory Visit (HOSPITAL_BASED_OUTPATIENT_CLINIC_OR_DEPARTMENT_OTHER): Payer: Medicaid Other | Admitting: Pain Medicine

## 2017-01-13 ENCOUNTER — Ambulatory Visit
Admission: RE | Admit: 2017-01-13 | Discharge: 2017-01-13 | Disposition: A | Discharge status: Home / Self Care | Payer: Medicaid Other | Source: Ambulatory Visit | Attending: Pain Medicine | Admitting: Pain Medicine

## 2017-01-13 VITALS — BP 105/83 | HR 93 | Temp 97.0°F | Resp 16 | Ht 65.0 in | Wt 248.0 lb

## 2017-01-13 DIAGNOSIS — Z96611 Presence of right artificial shoulder joint: Secondary | ICD-10-CM | POA: Insufficient documentation

## 2017-01-13 DIAGNOSIS — M4696 Unspecified inflammatory spondylopathy, lumbar region: Secondary | ICD-10-CM | POA: Insufficient documentation

## 2017-01-13 DIAGNOSIS — G5602 Carpal tunnel syndrome, left upper limb: Secondary | ICD-10-CM | POA: Diagnosis not present

## 2017-01-13 DIAGNOSIS — G8929 Other chronic pain: Secondary | ICD-10-CM

## 2017-01-13 DIAGNOSIS — G8918 Other acute postprocedural pain: Secondary | ICD-10-CM

## 2017-01-13 DIAGNOSIS — R55 Syncope and collapse: Secondary | ICD-10-CM

## 2017-01-13 DIAGNOSIS — Z888 Allergy status to other drugs, medicaments and biological substances status: Secondary | ICD-10-CM | POA: Diagnosis not present

## 2017-01-13 DIAGNOSIS — M5441 Lumbago with sciatica, right side: Secondary | ICD-10-CM | POA: Insufficient documentation

## 2017-01-13 DIAGNOSIS — R110 Nausea: Secondary | ICD-10-CM

## 2017-01-13 DIAGNOSIS — M25551 Pain in right hip: Secondary | ICD-10-CM | POA: Insufficient documentation

## 2017-01-13 DIAGNOSIS — M47816 Spondylosis without myelopathy or radiculopathy, lumbar region: Secondary | ICD-10-CM

## 2017-01-13 DIAGNOSIS — M25552 Pain in left hip: Secondary | ICD-10-CM | POA: Diagnosis not present

## 2017-01-13 DIAGNOSIS — Z96612 Presence of left artificial shoulder joint: Secondary | ICD-10-CM | POA: Diagnosis not present

## 2017-01-13 DIAGNOSIS — M549 Dorsalgia, unspecified: Secondary | ICD-10-CM | POA: Diagnosis not present

## 2017-01-13 HISTORY — DX: Other acute postprocedural pain: G89.18

## 2017-01-13 MED ORDER — ONDANSETRON HCL 4 MG/2ML IJ SOLN
INTRAMUSCULAR | Status: AC
  Filled 2017-01-13: qty 2

## 2017-01-13 MED ORDER — MIDAZOLAM HCL 5 MG/5ML IJ SOLN
INTRAMUSCULAR | Status: AC
  Filled 2017-01-13: qty 5

## 2017-01-13 MED ORDER — ONDANSETRON HCL 4 MG/2ML IJ SOLN
4.0000 mg | INTRAMUSCULAR | Status: DC | PRN
  Administered 2017-01-13: 4 mg via INTRAVENOUS

## 2017-01-13 MED ORDER — TRIAMCINOLONE ACETONIDE 40 MG/ML IJ SUSP
INTRAMUSCULAR | Status: AC
  Filled 2017-01-13: qty 1

## 2017-01-13 MED ORDER — LIDOCAINE HCL (PF) 1.5 % IJ SOLN
20.0000 mL | Freq: Once | INTRAMUSCULAR | Status: AC
  Administered 2017-01-13: 20 mL
  Filled 2017-01-13: qty 20

## 2017-01-13 MED ORDER — ROPIVACAINE HCL 2 MG/ML IJ SOLN
9.0000 mL | Freq: Once | INTRAMUSCULAR | Status: AC
  Administered 2017-01-13: 9 mL via PERINEURAL

## 2017-01-13 MED ORDER — FENTANYL CITRATE (PF) 100 MCG/2ML IJ SOLN
INTRAMUSCULAR | Status: AC
  Filled 2017-01-13: qty 2

## 2017-01-13 MED ORDER — HYDROCODONE-ACETAMINOPHEN 5-325 MG PO TABS: 1.0000 | ORAL_TABLET | Freq: Three times a day (TID) | ORAL | 0 refills | Status: DC | PRN

## 2017-01-13 MED ORDER — FENTANYL CITRATE (PF) 100 MCG/2ML IJ SOLN
25.0000 ug | INTRAMUSCULAR | Status: DC | PRN
  Administered 2017-01-13: 50 ug via INTRAVENOUS

## 2017-01-13 MED ORDER — GLYCOPYRROLATE 0.2 MG/ML IJ SOLN
INTRAMUSCULAR | Status: AC
  Filled 2017-01-13: qty 1

## 2017-01-13 MED ORDER — ROPIVACAINE HCL 2 MG/ML IJ SOLN
INTRAMUSCULAR | Status: AC
  Filled 2017-01-13: qty 10

## 2017-01-13 MED ORDER — MIDAZOLAM HCL 5 MG/5ML IJ SOLN
1.0000 mg | INTRAMUSCULAR | Status: DC | PRN
  Administered 2017-01-13: 2 mg via INTRAVENOUS

## 2017-01-13 MED ORDER — LACTATED RINGERS IV SOLN
1000.0000 mL | Freq: Once | INTRAVENOUS | Status: AC
  Administered 2017-01-13: 1000 mL via INTRAVENOUS

## 2017-01-13 MED ORDER — TRIAMCINOLONE ACETONIDE 40 MG/ML IJ SUSP
40.0000 mg | Freq: Once | INTRAMUSCULAR | Status: AC
  Administered 2017-01-13: 40 mg

## 2017-01-13 MED ORDER — GLYCOPYRROLATE 0.2 MG/ML IJ SOLN
0.2000 mg | Freq: Once | INTRAMUSCULAR | Status: AC
  Administered 2017-01-13: 0.2 mg via INTRAVENOUS

## 2017-01-13 NOTE — Progress Notes (Signed)
Patient's Name: Danielle [name]  MRN: [id number]  Referring Provider: Milinda Pointer, MD  DOB: 1960/10/18  PCP: Marygrace Drought, MD  DOS: 01/13/2017  Note by: Kathlen Brunswick. [name], MD  Service setting: Ambulatory outpatient  Specialty: Interventional Pain Management  Patient type: Established  Location: [facility] (AMB) Pain Management Facility  Visit type: Interventional Procedure   Primary Reason for Visit: Interventional Pain Management Treatment. CC: Back Pain and Hip Pain (bilateral, left worse)  Procedure:  Anesthesia, Analgesia, Anxiolysis:  Type: Therapeutic Medial Branch Facet Radiofrequency Ablation Region: Lumbar Level: L2, L3, L4, L5, & S1 Medial Branch Level(s) Laterality: Right-Sided  Type: Local Anesthesia with Moderate (Conscious) Sedation Local Anesthetic: Lidocaine 1% Route: Intravenous (IV) IV Access: Secured Sedation: Meaningful verbal contact was maintained at all times during the procedure  Indication(s): Analgesia and Anxiety  Indications: 1. Lumbar facet syndrome (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)   2. Chronic low back pain (Location of Tertiary source of pain) (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)   3. Lumbar spondylosis   4. Acute postoperative pain   5. Nausea   6. Vasovagal episode    Danielle [name] has either failed to respond, was unable to tolerate, or simply did not get enough benefit from other more conservative therapies including, but not limited to: 1. Over-the-counter medications 2. Anti-inflammatory medications 3. Muscle relaxants 4. Membrane stabilizers 5. Opioids 6. Physical therapy 7. Modalities (Heat, ice, etc.) 8. Invasive techniques such as nerve blocks. Danielle [name] has attained more than 50% relief of the pain from a series of diagnostic injections conducted in separate occasions.  Pain Score: Pre-procedure: 6 /10 Post-procedure: 0-No pain/10  Pre-op Assessment:  Previous date of service: 11/25/16 Service provided: Evaluation Danielle [name] is a 56 y.o. (year old), female  patient, seen today for interventional treatment. She  has a past surgical history that includes necksurgery (10/21/2014); Back surgery (2013); Foot surgery (Right); Elbow surgery (Left); Carpal tunnel release (Right); Nose surgery; Partial hysterectomy; and Total shoulder replacement (Bilateral). Danielle [name] has a current medication list which includes the following prescription(s): albuterol, cetirizine, vitamin d3, citalopram, cyanocobalamin, diclofenac sodium, docusate sodium, fluconazole, levothyroxine, loratadine, magnesium hydroxide, montelukast, naloxone, nystatin, oxycodone hcl, polyethylene glycol powder, spiriva handihaler, cyclobenzaprine, hydrocodone-acetaminophen, oxycodone hcl, and oxycodone hcl, and the following Facility-Administered Medications: fentanyl, midazolam, and ondansetron. Her primarily concern today is the Back Pain and Hip Pain (bilateral, left worse)  Initial Vital Signs: Blood pressure 131/78, pulse 78, temperature 98.4 F (36.9 C), resp. rate 18, height 5\' 5"  (1.651 m), weight 248 lb (112.5 kg), SpO2 100 %. BMI: 41.27 kg/m  Risk Assessment: Allergies: Reviewed. She is allergic to aripiprazole; duloxetine; gabapentin; gold; iodinated diagnostic agents; naproxen sodium; nortriptyline hcl; nsaids; pregabalin; ace inhibitors; aspirin; cefpodoxime; fluoxetine; fluticasone-salmeterol; levofloxacin; lithium; meperidine; paroxetine; tape; telithromycin; theophylline; topiramate; trazodone; triamcinolone; venlafaxine; bupropion; ketorolac tromethamine; morphine; and moxifloxacin.  Allergy Precautions: None required Coagulopathies: Reviewed. None identified.  Blood-thinner therapy: None at this time Active Infection(s): Reviewed. None identified. Danielle [name] is afebrile  Site Confirmation: Danielle [name] was asked to confirm the procedure and laterality before marking the site Procedure checklist: Completed Consent: Before the procedure and under the influence of no sedative(s),  amnesic(s), or anxiolytics, the patient was informed of the treatment options, risks and possible complications. To fulfill our ethical and legal obligations, as recommended by the American Medical Association's Code of Ethics, I have informed the patient of my clinical impression; the nature and purpose of the treatment or procedure; the risks, benefits, and possible complications of the intervention; the alternatives, including doing nothing;  the risk(s) and benefit(s) of the alternative treatment(s) or procedure(s); and the risk(s) and benefit(s) of doing nothing. The patient was provided information about the general risks and possible complications associated with the procedure. These may include, but are not limited to: failure to achieve desired goals, infection, bleeding, organ or nerve damage, allergic reactions, paralysis, and death. In addition, the patient was informed of those risks and complications associated to Spine-related procedures, such as failure to decrease pain; infection (i.e.: Meningitis, epidural or intraspinal abscess); bleeding (i.e.: epidural hematoma, subarachnoid hemorrhage, or any other type of intraspinal or peri-dural bleeding); organ or nerve damage (i.e.: Any type of peripheral nerve, nerve root, or spinal cord injury) with subsequent damage to sensory, motor, and/or autonomic systems, resulting in permanent pain, numbness, and/or weakness of one or several areas of the body; allergic reactions; (i.e.: anaphylactic reaction); and/or death. Furthermore, the patient was informed of those risks and complications associated with the medications. These include, but are not limited to: allergic reactions (i.e.: anaphylactic or anaphylactoid reaction(s)); adrenal axis suppression; blood sugar elevation that in diabetics may result in ketoacidosis or comma; water retention that in patients with history of congestive heart failure may result in shortness of breath, pulmonary edema, and  decompensation with resultant heart failure; weight gain; swelling or edema; medication-induced neural toxicity; particulate matter embolism and blood vessel occlusion with resultant organ, and/or nervous system infarction; and/or aseptic necrosis of one or more joints. Finally, the patient was informed that Medicine is not an exact science; therefore, there is also the possibility of unforeseen or unpredictable risks and/or possible complications that may result in a catastrophic outcome. The patient indicated having understood very clearly. We have given the patient no guarantees and we have made no promises. Enough time was given to the patient to ask questions, all of which were answered to the patient's satisfaction. Ms. Petitjean has indicated that she wanted to continue with the procedure. Attestation: I, the ordering provider, attest that I have discussed with the patient the benefits, risks, side-effects, alternatives, likelihood of achieving goals, and potential problems during recovery for the procedure that I have provided informed consent. Date: 01/13/2017; Time: 10:44 AM  Pre-Procedure Preparation:  Monitoring: As per clinic protocol. Respiration, ETCO2, SpO2, BP, heart rate and rhythm monitor placed and checked for adequate function Safety Precautions: Patient was assessed for positional comfort and pressure points before starting the procedure. Time-out: I initiated and conducted the "Time-out" before starting the procedure, as per protocol. The patient was asked to participate by confirming the accuracy of the "Time Out" information. Verification of the correct person, site, and procedure were performed and confirmed by me, the nursing staff, and the patient. "Time-out" conducted as per Joint Commission's Universal Protocol (UP.01.01.01). "Time-out" Date & Time: 01/13/2017; 1121 hrs.  Description of Procedure Process:   Position: Prone Target Area: For Lumbar Facet blocks, the target is the  groove formed by the junction of the transverse process and superior articular process. For the L5 dorsal ramus, the target is the notch between superior articular process and sacral ala. For the S1 dorsal ramus, the target is the superior and lateral edge of the posterior S1 Sacral foramen. Approach: Paraspinal approach. Area Prepped: Entire Posterior Lumbosacral Region Prepping solution: Hibiclens (4.0% Chlorhexidine gluconate solution) Safety Precautions: Aspiration looking for blood return was conducted prior to all injections. At no point did we inject any substances, as a needle was being advanced. No attempts were made at seeking any paresthesias. Safe injection practices and  needle disposal techniques used. Medications properly checked for expiration dates. SDV (single dose vial) medications used. Description of the Procedure: Protocol guidelines were followed. The patient was placed in position over the fluoroscopy table. The target area was identified and the area prepped in the usual manner. Skin desensitized using vapocoolant spray. Skin & deeper tissues infiltrated with local anesthetic. Appropriate amount of time allowed to pass for local anesthetics to take effect. Radiofrequency needles were introduced to the area of the medial branch at the junction of the superior articular process and transverse process using fluoroscopy. Using the [employer], sensory stimulation using 50 Hz was used to locate & identify the nerve, making sure that the needle was positioned such that there was no sensory stimulation below 0.3 V or above 0.7 V. Stimulation using 2 Hz was used to evaluate the motor component. Care was taken not to lesion any nerves that demonstrated motor stimulation of the lower extremities at an output of less than 2.5 times that of the sensory threshold, or a maximum of 2.0 V. Once satisfactory placement of the needles was achieved, the above solution was slowly  injected after negative aspiration. After waiting for at least 2 minutes, the ablation was performed at 80 degrees C for 60 seconds.The needles were then removed and the area cleansed, making sure to leave some of the prepping solution back to take advantage of its long term bactericidal properties. Intra-operative Compliance: Compliant  Illustration of the posterior view of the lumbar spine and the posterior neural structures. Laminae of L2 through S1 are labeled. DPRL5, dorsal primary ramus of L5; DPRS1, dorsal primary ramus of S1; DPR3, dorsal primary ramus of L3; FJ, facet (zygapophyseal) joint L3-L4; I, inferior articular process of L4; LB1, lateral branch of dorsal primary ramus of L1; IAB, inferior articular branches from L3 medial branch (supplies L4-L5 facet joint); IBP, intermediate branch plexus; MB3, medial branch of dorsal primary ramus of L3; NR3, third lumbar nerve root; S, superior articular process of L5; SAB, superior articular branches from L4 (supplies L4-5 facet joint also); TP3, transverse process of L3.  Vitals:   01/13/17 1205 01/13/17 1215 01/13/17 1224 01/13/17 1235  BP: 119/76 (!) 127/54 116/72 105/83  Pulse: 70 85 86 93  Resp: 18 15 13 16   Temp:   97 F (36.1 C)   SpO2: 100% 99% 98% 98%  Weight:      Height:        Start Time: 1121 hrs. End Time: 1202 hrs. Materials & Medications:  Needle(s) Type: Teflon-coated, curved tip, Radiofrequency needle(s) Gauge: 22G Length: 10cm Medication(s): We administered lactated ringers, midazolam, fentaNYL, lidocaine, triamcinolone acetonide, ropivacaine (PF) 2 mg/mL (0.2%), glycopyrrolate, and ondansetron. Please see chart orders for dosing details.  Imaging Guidance (Spinal):  Type of Imaging Technique: Fluoroscopy Guidance (Spinal) Indication(s): Assistance in needle guidance and placement for procedures requiring needle placement in or near specific anatomical locations not easily accessible without such assistance. Exposure  Time: Please see nurses notes. Contrast: None used. Fluoroscopic Guidance: I was personally present during the use of fluoroscopy. "Tunnel Vision Technique" used to obtain the best possible view of the target area. Parallax error corrected before commencing the procedure. "Direction-depth-direction" technique used to introduce the needle under continuous pulsed fluoroscopy. Once target was reached, antero-posterior, oblique, and lateral fluoroscopic projection used confirm needle placement in all planes. Images permanently stored in EMR. Interpretation: No contrast injected. I personally interpreted the imaging intraoperatively. Adequate needle placement confirmed in multiple planes. Permanent images saved  into the patient's record.  Antibiotic Prophylaxis:  Indication(s): None identified Antibiotic given: None  Post-operative Assessment:  EBL: None Complications: No immediate post-treatment complications observed by team, or reported by patient. Intra-operative vasovagal reflex with nausea. Note: The patient tolerated the entire procedure well. A repeat set of vitals were taken after the procedure and the patient was kept under observation following institutional policy, for this type of procedure. Post-procedural neurological assessment was performed, showing return to baseline, prior to discharge. The patient was provided with post-procedure discharge instructions, including a section on how to identify potential problems. Should any problems arise concerning this procedure, the patient was given instructions to immediately contact us, at any time, without hesitation. In any case, we plan to contact the patient by telephone for a follow-up status report regarding this interventional procedure. Comments:  No additional relevant information.  Plan of Care  Disposition: Discharge home  Discharge Date & Time: 01/13/2017; 1235 hrs.  Physician-requested Follow-up:  Return in about 2 weeks (around  01/27/2017) for contralateral RF (in 2-wks).  Future Appointments Date Time Provider [place]  01/23/2017 1:00 PM Vevelyn Francois, NP [facility] None  04/14/2017 10:15 AM Milinda Pointer, MD [facility] None   Medications ordered for procedure: Meds ordered this encounter  Medications  . lactated ringers infusion 1,000 mL  . midazolam (VERSED) 5 MG/5ML injection 1-2 mg    Make sure Flumazenil is available in the pyxis when using this medication. If oversedation occurs, administer 0.2 mg IV over 15 sec. If after 45 sec no response, administer 0.2 mg again over 1 min; may repeat at 1 min intervals; not to exceed 4 doses (1 mg)  . fentaNYL (SUBLIMAZE) injection 25-50 mcg    Make sure Narcan is available in the pyxis when using this medication. In the event of respiratory depression (RR< 8/min): Titrate NARCAN (naloxone) in increments of 0.1 to 0.2 mg IV at 2-3 minute intervals, until desired degree of reversal.  . lidocaine 1.5 % injection 20 mL  . triamcinolone acetonide (KENALOG-40) injection 40 mg  . ropivacaine (PF) 2 mg/mL (0.2%) (NAROPIN) injection 9 mL  . HYDROcodone-acetaminophen (NORCO/VICODIN) 5-325 MG tablet    Sig: Take 1 tablet by mouth every 8 (eight) hours as needed for severe pain.    Dispense:  30 tablet    Refill:  0    For acute post-operative pain. Not to be refilled. To last until: 01/23/17  . glycopyrrolate (ROBINUL) injection 0.2 mg  . ondansetron (ZOFRAN) injection 4 mg   Medications administered: We administered lactated ringers, midazolam, fentaNYL, lidocaine, triamcinolone acetonide, ropivacaine (PF) 2 mg/mL (0.2%), glycopyrrolate, and ondansetron.  See the medical record for exact dosing, route, and time of administration.  Lab-work, Procedure(s), & Referral(s) Ordered: Orders Placed This Encounter  Procedures  . Radiofrequency,Lumbar  . DG C-Arm 1-60 Min-No Report  . Informed Consent Details: Transcribe to consent form and obtain patient signature    . Provider attestation of informed consent for procedure/surgical case  . Verify informed consent  . Discharge instructions  . Follow-up   Imaging Ordered: Results for orders placed in visit on 10/30/16  DG C-Arm 1-60 Min-No Report   Narrative Fluoroscopy was utilized by the requesting physician.  No radiographic  interpretation.    New Prescriptions   HYDROCODONE-ACETAMINOPHEN (NORCO/VICODIN) 5-325 MG TABLET    Take 1 tablet by mouth every 8 (eight) hours as needed for severe pain.   Primary Care Physician: Marygrace Drought, MD Location: [facility] Note by:  Brenten Janney A. [name], M.D, DABA, DABAPM, DABPM, DABIPP, FIPP Date: 01/13/2017; Time: 1:12 PM  Disclaimer:  Medicine is not an Chief Strategy Officer. The only guarantee in medicine is that nothing is guaranteed. It is important to note that the decision to proceed with this intervention was based on the information collected from the patient. The Data and conclusions were drawn from the patient's questionnaire, the interview, and the physical examination. Because the information was provided in large part by the patient, it cannot be guaranteed that it has not been purposely or unconsciously manipulated. Every effort has been made to obtain as much relevant data as possible for this evaluation. It is important to note that the conclusions that lead to this procedure are derived in large part from the available data. Always take into account that the treatment will also be dependent on availability of resources and existing treatment guidelines, considered by other Pain Management Practitioners as being common knowledge and practice, at the time of the intervention. For Medico-Legal purposes, it is also important to point out that variation in procedural techniques and pharmacological choices are the acceptable norm. The indications, contraindications, technique, and results of the above procedure should only be  interpreted and judged by a Board-Certified Interventional Pain Specialist with extensive familiarity and expertise in the same exact procedure and technique.  Instructions provided at this appointment: Patient Instructions   ___________________________________________________________________________You were given Hydrocodone x 1 for post RF pain.  Instructed only to use as necessary for RF pain that is not controlled by Oxycodone.  _________________  Post-Procedure instructions Instructions:  Apply ice: Fill a plastic sandwich bag with crushed ice. Cover it with a small towel and apply to injection site. Apply for 15 minutes then remove x 15 minutes. Repeat sequence on day of procedure, until you go to bed. The purpose is to minimize swelling and discomfort after procedure.  Apply heat: Apply heat to procedure site starting the day following the procedure. The purpose is to treat any soreness and discomfort from the procedure.  Food intake: Start with clear liquids (like water) and advance to regular food, as tolerated.   Physical activities: Keep activities to a minimum for the first 8 hours after the procedure.   Driving: If you have received any sedation, you are not allowed to drive for 24 hours after your procedure.  Blood thinner: Restart your blood thinner 6 hours after your procedure. (Only for those taking blood thinners)  Insulin: As soon as you can eat, you may resume your normal dosing schedule. (Only for those taking insulin)  Infection prevention: Keep procedure site clean and dry.  Post-procedure Pain Diary: Extremely important that this be done correctly and accurately. Recorded information will be used to determine the next step in treatment.  Pain evaluated is that of treated area only. Do not include pain from an untreated area.  Complete every hour, on the hour, for the initial 8 hours. Set an alarm to help you do this part accurately.  Do not go to sleep and have  it completed later. It will not be accurate.  Follow-up appointment: Keep your follow-up appointment after the procedure. Usually 2 weeks for most procedures. (6 weeks in the case of radiofrequency.) Bring you pain diary.  Expect:  From numbing medicine (AKA: Local Anesthetics): Numbness or decrease in pain.  Onset: Full effect within 15 minutes of injected.  Duration: It will depend on the type of local anesthetic used. On the average, 1 to 8 hours.  From steroids: Decrease in swelling or inflammation. Once inflammation is improved, relief of the pain will follow.  Onset of benefits: Depends on the amount of swelling present. The more swelling, the longer it will take for the benefits to be seen. In some cases, up to 10 days.  Duration: Steroids will stay in the system x 2 weeks. Duration of benefits will depend on multiple posibilities including persistent irritating factors.  From procedure: Some discomfort is to be expected once the numbing medicine wears off. This should be minimal if ice and heat are applied as instructed. Call if:  You experience numbness and weakness that gets worse with time, as opposed to wearing off.  New onset bowel or bladder incontinence. (Spinal procedures only)  Emergency Numbers:  Durning business hours (Monday - Thursday, 8:00 AM - 4:00 PM) (Friday, 9:00 AM - 12:00 Noon): [phone]  After hours: [phone] ____________________________________________________________________________________________  Pain Management Discharge Instructions  General Discharge Instructions :  If you need to reach your doctor call: Monday-Friday 8:00 am - 4:00 pm at [phone] or toll free [phone].  After clinic hours [phone] to have operator reach doctor.  Bring all of your medication bottles to all your appointments in the pain clinic.  To cancel or reschedule your appointment with Pain Management please remember to call 24 hours in advance  to avoid a fee.  Refer to the educational materials which you have been given on: General Risks, I had my Procedure. Discharge Instructions, Post Sedation.  Post Procedure Instructions:  The drugs you were given will stay in your system until tomorrow, so for the next 24 hours you should not drive, make any legal decisions or drink any alcoholic beverages.  You may eat anything you prefer, but it is better to start with liquids then soups and crackers, and gradually work up to solid foods.  Please notify your doctor immediately if you have any unusual bleeding, trouble breathing or pain that is not related to your normal pain.  Depending on the type of procedure that was done, some parts of your body may feel week and/or numb.  This usually clears up by tonight or the next day.  Walk with the use of an assistive device or accompanied by an adult for the 24 hours.  You may use ice on the affected area for the first 24 hours.  Put ice in a Ziploc bag and cover with a towel and place against area 15 minutes on 15 minutes off.  You may switch to heat after 24 hours.Radiofrequency Lesioning Radiofrequency lesioning is a procedure that is performed to relieve pain. The procedure is often used for back, neck, or arm pain. Radiofrequency lesioning involves the use of a machine that creates radio waves to make heat. During the procedure, the heat is applied to the nerve that carries the pain signal. The heat damages the nerve and interferes with the pain signal. Pain relief usually starts about 2 weeks after the procedure and lasts for 6 months to 1 year. Tell a health care provider about:  Any allergies you have.  All medicines you are taking, including vitamins, herbs, eye drops, creams, and over-the-counter medicines.  Any problems you or family members have had with anesthetic medicines.  Any blood disorders you have.  Any surgeries you have had.  Any medical conditions you have.  Whether  you are pregnant or may be pregnant. What are the risks? Generally, this is a safe procedure. However, problems may occur, including:  Pain or soreness at the injection site.  Infection at the injection site.  Damage to nerves or blood vessels.  What happens before the procedure?  Ask your health care provider about: ? Changing or stopping your regular medicines. This is especially important if you are taking diabetes medicines or blood thinners. ? Taking medicines such as aspirin and ibuprofen. These medicines can thin your blood. Do not take these medicines before your procedure if your health care provider instructs you not to.  Follow instructions from your health care provider about eating or drinking restrictions.  Plan to have someone take you home after the procedure.  If you go home right after the procedure, plan to have someone with you for 24 hours. What happens during the procedure?  You will be given one or more of the following: ? A medicine to help you relax (sedative). ? A medicine to numb the area (local anesthetic).  You will be awake during the procedure. You will need to be able to talk with the health care provider during the procedure.  With the help of a type of X-ray (fluoroscopy), the health care provider will insert a radiofrequency needle into the area to be treated.  Next, a wire that carries the radio waves (electrode) will be put through the radiofrequency needle. An electrical pulse will be sent through the electrode to verify the correct nerve. You will feel a tingling sensation, and you may have muscle twitching.  Then, the tissue that is around the needle tip will be heated by an electric current that is passed using the radiofrequency machine. This will numb the nerves.  A bandage (dressing) will be put on the insertion area after the procedure is done. The procedure may vary among health care providers and hospitals. What happens after the  procedure?  Your blood pressure, heart rate, breathing rate, and blood oxygen level will be monitored often until the medicines you were given have worn off.  Return to your normal activities as directed by your health care provider. This information is not intended to replace advice given to you by your health care provider. Make sure you discuss any questions you have with your health care provider. Document Released: 02/27/2011 Document Revised: 12/07/2015 Document Reviewed: 08/08/2014 Elsevier Interactive Patient Education  2018 [place] Radiofrequency Lesioning, Care After Refer to this sheet in the next few weeks. These instructions provide you with information about caring for yourself after your procedure. Your health care provider may also give you more specific instructions. Your treatment has been planned according to current medical practices, but problems sometimes occur. Call your health care provider if you have any problems or questions after your procedure. What can I expect after the procedure? After the procedure, it is common to have:  Pain from the burned nerve.  Temporary numbness.  Follow these instructions at home:  Take over-the-counter and prescription medicines only as told by your health care provider.  Return to your normal activities as told by your health care provider. Ask your health care provider what activities are safe for you.  Pay close attention to how you feel after the procedure. If you start to have pain, write down when it hurts and how it feels. This will help you and your health care provider to know if you need an additional treatment.  Check your needle insertion site every day for signs of infection. Watch for: ? Redness, swelling, or pain. ? Fluid, blood, or pus.  Keep all  follow-up visits as told by your health care provider. This is important. Contact a health care provider if:  Your pain does not get better.  You have  redness, swelling, or pain at the needle insertion site.  You have fluid, blood, or pus coming from the needle insertion site.  You have a fever. Get help right away if:  You develop sudden, severe pain.  You develop numbness or tingling near the procedure site that does not go away. This information is not intended to replace advice given to you by your health care provider. Make sure you discuss any questions you have with your health care provider. Document Released: 02/28/2011 Document Revised: 12/07/2015 Document Reviewed: 08/08/2014 Elsevier Interactive Patient Education  2018 [place]  What are the risk, side effects and possible complications? Generally speaking, most procedures are safe.  However, with any procedure there are risks, side effects, and the possibility of complications.  The risks and complications are dependent upon the sites that are lesioned, or the type of nerve block to be performed.  The closer the procedure is to the spine, the more serious the risks are.  Great care is taken when placing the radio frequency needles, block needles or lesioning probes, but sometimes complications can occur. 1. Infection: Any time there is an injection through the skin, there is a risk of infection.  This is why sterile conditions are used for these blocks.  There are four possible types of infection. 1. Localized skin infection. 2. Central Nervous System Infection-This can be in the form of Meningitis, which can be deadly. 3. Epidural Infections-This can be in the form of an epidural abscess, which can cause pressure inside of the spine, causing compression of the spinal cord with subsequent paralysis. This would require an emergency surgery to decompress, and there are no guarantees that the patient would recover from the paralysis. 4. Discitis-This is an infection of the intervertebral discs.  It occurs in about 1% of discography procedures.   It is difficult to treat and it may lead to surgery.        2. Pain: the needles have to go through skin and soft tissues, will cause soreness.       3. Damage to internal structures:  The nerves to be lesioned may be near blood vessels or    other nerves which can be potentially damaged.       4. Bleeding: Bleeding is more common if the patient is taking blood thinners such as  aspirin, Coumadin, Ticiid, Plavix, etc., or if he/she have some genetic predisposition  such as hemophilia. Bleeding into the spinal canal can cause compression of the spinal  cord with subsequent paralysis.  This would require an emergency surgery to  decompress and there are no guarantees that the patient would recover from the  paralysis.       5. Pneumothorax:  Puncturing of a lung is a possibility, every time a needle is introduced in  the area of the chest or upper back.  Pneumothorax refers to free air around the  collapsed lung(s), inside of the thoracic cavity (chest cavity).  Another two possible  complications related to a similar event would include: Hemothorax and Chylothorax.   These are variations of the Pneumothorax, where instead of air around the collapsed  lung(s), you may have blood or chyle, respectively.       6. Spinal headaches: They may occur with any procedures in the area of  the spine.       7. Persistent CSF (Cerebro-Spinal Fluid) leakage: This is a rare problem, but may occur  with prolonged intrathecal or epidural catheters either due to the formation of a fistulous  track or a dural tear.       8. Nerve damage: By working so close to the spinal cord, there is always a possibility of  nerve damage, which could be as serious as a permanent spinal cord injury with  paralysis.       9. Death:  Although rare, severe deadly allergic reactions known as "Anaphylactic  reaction" can occur to any of the medications used.      10. Worsening of the symptoms:  We can always make thing worse.  What are the  chances of something like this happening? Chances of any of this occuring are extremely low.  By statistics, you have more of a chance of getting killed in a motor vehicle accident: while driving to the hospital than any of the above occurring .  Nevertheless, you should be aware that they are possibilities.  In general, it is similar to taking a shower.  Everybody knows that you can slip, hit your head and get killed.  Does that mean that you should not shower again?  Nevertheless always keep in mind that statistics do not mean anything if you happen to be on the wrong side of them.  Even if a procedure has a 1 (one) in a 1,000,000 (million) chance of going wrong, it you happen to be that one..Also, keep in mind that by statistics, you have more of a chance of having something go wrong when taking medications.  Who should not have this procedure? If you are on a blood thinning medication (e.g. Coumadin, Plavix, see list of "Blood Thinners"), or if you have an active infection going on, you should not have the procedure.  If you are taking any blood thinners, please inform your physician.  How should I prepare for this procedure?  Do not eat or drink anything at least six hours prior to the procedure.  Bring a driver with you .  It cannot be a taxi.  Come accompanied by an adult that can drive you back, and that is strong enough to help you if your legs get weak or numb from the local anesthetic.  Take all of your medicines the morning of the procedure with just enough water to swallow them.  If you have diabetes, make sure that you are scheduled to have your procedure done first thing in the morning, whenever possible.  If you have diabetes, take only half of your insulin dose and notify our nurse that you have done so as soon as you arrive at the clinic.  If you are diabetic, but only take blood sugar pills (oral hypoglycemic), then do not take them on the morning of your procedure.  You may  take them after you have had the procedure.  Do not take aspirin or any aspirin-containing medications, at least eleven (11) days prior to the procedure.  They may prolong bleeding.  Wear loose fitting clothing that may be easy to take off and that you would not mind if it got stained with Betadine or blood.  Do not wear any jewelry or perfume  Remove any nail coloring.  It will interfere with some of our monitoring equipment.  NOTE: Remember that this is not meant to be interpreted as a complete list of all possible complications.  Unforeseen problems may occur.  BLOOD THINNERS The following drugs contain aspirin or other products, which can cause increased bleeding during surgery and should not be taken for 2 weeks prior to and 1 week after surgery.  If you should need take something for relief of minor pain, you may take acetaminophen which is found in Tylenol,m Datril, Anacin-3 and Panadol. It is not blood thinner. The products listed below are.  Do not take any of the products listed below in addition to any listed on your instruction sheet.  A.P.C or A.P.C with Codeine Codeine Phosphate Capsules #3 Ibuprofen Ridaura  ABC compound Congesprin Imuran rimadil  Advil Cope Indocin Robaxisal  Alka-Seltzer Effervescent Pain Reliever and Antacid Coricidin or Coricidin-D  Indomethacin Rufen  Alka-Seltzer plus Cold Medicine Cosprin Ketoprofen S-A-C Tablets  Anacin Analgesic Tablets or Capsules Coumadin Korlgesic Salflex  Anacin Extra Strength Analgesic tablets or capsules CP-2 Tablets Lanoril Salicylate  Anaprox Cuprimine Capsules Levenox Salocol  Anexsia-D Dalteparin Magan Salsalate  Anodynos Darvon compound Magnesium Salicylate Sine-off  Ansaid Dasin Capsules Magsal Sodium Salicylate  Anturane Depen Capsules Marnal Soma  APF Arthritis pain formula Dewitt's Pills Measurin Stanback  Argesic Dia-Gesic Meclofenamic Sulfinpyrazone  Arthritis Bayer Timed Release Aspirin Diclofenac Meclomen  Sulindac  Arthritis pain formula Anacin Dicumarol Medipren Supac  Analgesic (Safety coated) Arthralgen Diffunasal Mefanamic Suprofen  Arthritis Strength Bufferin Dihydrocodeine Mepro Compound Suprol  Arthropan liquid Dopirydamole Methcarbomol with Aspirin Synalgos  ASA tablets/Enseals Disalcid Micrainin Tagament  Ascriptin Doan's Midol Talwin  Ascriptin A/D Dolene Mobidin Tanderil  Ascriptin Extra Strength Dolobid Moblgesic Ticlid  Ascriptin with Codeine Doloprin or Doloprin with Codeine Momentum Tolectin  Asperbuf Duoprin Mono-gesic Trendar  Aspergum Duradyne Motrin or Motrin IB Triminicin  Aspirin plain, buffered or enteric coated Durasal Myochrisine Trigesic  Aspirin Suppositories Easprin Nalfon Trillsate  Aspirin with Codeine Ecotrin Regular or Extra Strength Naprosyn Uracel  Atromid-S Efficin Naproxen Ursinus  Auranofin Capsules Elmiron Neocylate Vanquish  Axotal Emagrin Norgesic Verin  Azathioprine Empirin or Empirin with Codeine Normiflo Vitamin E  Azolid Emprazil Nuprin Voltaren  Bayer Aspirin plain, buffered or children's or timed BC Tablets or powders Encaprin Orgaran Warfarin Sodium  Buff-a-Comp Enoxaparin Orudis Zorpin  Buff-a-Comp with Codeine Equegesic Os-Cal-Gesic   Buffaprin Excedrin plain, buffered or Extra Strength Oxalid   Bufferin Arthritis Strength Feldene Oxphenbutazone   Bufferin plain or Extra Strength Feldene Capsules Oxycodone with Aspirin   Bufferin with Codeine Fenoprofen Fenoprofen Pabalate or Pabalate-SF   Buffets II Flogesic Panagesic   Buffinol plain or Extra Strength Florinal or Florinal with Codeine Panwarfarin   Buf-Tabs Flurbiprofen Penicillamine   Butalbital Compound Four-way cold tablets Penicillin   Butazolidin Fragmin Pepto-Bismol   Carbenicillin Geminisyn Percodan   Carna Arthritis Reliever Geopen Persantine   Carprofen Gold's salt Persistin   Chloramphenicol Goody's Phenylbutazone   Chloromycetin Haltrain Piroxlcam   Clmetidine heparin  Plaquenil   Cllnoril Hyco-pap Ponstel   Clofibrate Hydroxy chloroquine Propoxyphen         Before stopping any of these medications, be sure to consult the physician who ordered them.  Some, such as Coumadin (Warfarin) are ordered to prevent or treat serious conditions such as "deep thrombosis", "pumonary embolisms", and other heart problems.  The amount of time that you may need off of the medication may also vary with the medication and the reason for which you were taking it.  If you are taking any of these medications, please make sure you notify your pain physician before you undergo any procedures.

## 2017-01-13 NOTE — Patient Instructions (Addendum)
___________________________________________________________________________You were given Hydrocodone x 1 for post RF pain.  Instructed only to use as necessary for RF pain that is not controlled by Oxycodone.  _________________  Post-Procedure instructions Instructions:  Apply ice: Fill a plastic sandwich bag with crushed ice. Cover it with a small towel and apply to injection site. Apply for 15 minutes then remove x 15 minutes. Repeat sequence on day of procedure, until you go to bed. The purpose is to minimize swelling and discomfort after procedure.  Apply heat: Apply heat to procedure site starting the day following the procedure. The purpose is to treat any soreness and discomfort from the procedure.  Food intake: Start with clear liquids (like water) and advance to regular food, as tolerated.   Physical activities: Keep activities to a minimum for the first 8 hours after the procedure.   Driving: If you have received any sedation, you are not allowed to drive for 24 hours after your procedure.  Blood thinner: Restart your blood thinner 6 hours after your procedure. (Only for those taking blood thinners)  Insulin: As soon as you can eat, you may resume your normal dosing schedule. (Only for those taking insulin)  Infection prevention: Keep procedure site clean and dry.  Post-procedure Pain Diary: Extremely important that this be done correctly and accurately. Recorded information will be used to determine the next step in treatment.  Pain evaluated is that of treated area only. Do not include pain from an untreated area.  Complete every hour, on the hour, for the initial 8 hours. Set an alarm to help you do this part accurately.  Do not go to sleep and have it completed later. It will not be accurate.  Follow-up appointment: Keep your follow-up appointment after the procedure. Usually 2 weeks for most procedures. (6 weeks in the case of radiofrequency.) Bring you pain diary.   Expect:  From numbing medicine (AKA: Local Anesthetics): Numbness or decrease in pain.  Onset: Full effect within 15 minutes of injected.  Duration: It will depend on the type of local anesthetic used. On the average, 1 to 8 hours.   From steroids: Decrease in swelling or inflammation. Once inflammation is improved, relief of the pain will follow.  Onset of benefits: Depends on the amount of swelling present. The more swelling, the longer it will take for the benefits to be seen. In some cases, up to 10 days.  Duration: Steroids will stay in the system x 2 weeks. Duration of benefits will depend on multiple posibilities including persistent irritating factors.  From procedure: Some discomfort is to be expected once the numbing medicine wears off. This should be minimal if ice and heat are applied as instructed. Call if:  You experience numbness and weakness that gets worse with time, as opposed to wearing off.  New onset bowel or bladder incontinence. (Spinal procedures only)  Emergency Numbers:  Durning business hours (Monday - Thursday, 8:00 AM - 4:00 PM) (Friday, 9:00 AM - 12:00 Noon): [phone]  After hours: [phone] ____________________________________________________________________________________________  Pain Management Discharge Instructions  General Discharge Instructions :  If you need to reach your doctor call: Monday-Friday 8:00 am - 4:00 pm at [phone] or toll free [phone].  After clinic hours [phone] to have operator reach doctor.  Bring all of your medication bottles to all your appointments in the pain clinic.  To cancel or reschedule your appointment with Pain Management please remember to call 24 hours in advance to avoid a fee.  Refer to  the educational materials which you have been given on: General Risks, I had my Procedure. Discharge Instructions, Post Sedation.  Post Procedure Instructions:  The drugs you were given will  stay in your system until tomorrow, so for the next 24 hours you should not drive, make any legal decisions or drink any alcoholic beverages.  You may eat anything you prefer, but it is better to start with liquids then soups and crackers, and gradually work up to solid foods.  Please notify your doctor immediately if you have any unusual bleeding, trouble breathing or pain that is not related to your normal pain.  Depending on the type of procedure that was done, some parts of your body may feel week and/or numb.  This usually clears up by tonight or the next day.  Walk with the use of an assistive device or accompanied by an adult for the 24 hours.  You may use ice on the affected area for the first 24 hours.  Put ice in a Ziploc bag and cover with a towel and place against area 15 minutes on 15 minutes off.  You may switch to heat after 24 hours.Radiofrequency Lesioning Radiofrequency lesioning is a procedure that is performed to relieve pain. The procedure is often used for back, neck, or arm pain. Radiofrequency lesioning involves the use of a machine that creates radio waves to make heat. During the procedure, the heat is applied to the nerve that carries the pain signal. The heat damages the nerve and interferes with the pain signal. Pain relief usually starts about 2 weeks after the procedure and lasts for 6 months to 1 year. Tell a health care provider about:  Any allergies you have.  All medicines you are taking, including vitamins, herbs, eye drops, creams, and over-the-counter medicines.  Any problems you or family members have had with anesthetic medicines.  Any blood disorders you have.  Any surgeries you have had.  Any medical conditions you have.  Whether you are pregnant or may be pregnant. What are the risks? Generally, this is a safe procedure. However, problems may occur, including:  Pain or soreness at the injection site.  Infection at the injection  site.  Damage to nerves or blood vessels.  What happens before the procedure?  Ask your health care provider about: ? Changing or stopping your regular medicines. This is especially important if you are taking diabetes medicines or blood thinners. ? Taking medicines such as aspirin and ibuprofen. These medicines can thin your blood. Do not take these medicines before your procedure if your health care provider instructs you not to.  Follow instructions from your health care provider about eating or drinking restrictions.  Plan to have someone take you home after the procedure.  If you go home right after the procedure, plan to have someone with you for 24 hours. What happens during the procedure?  You will be given one or more of the following: ? A medicine to help you relax (sedative). ? A medicine to numb the area (local anesthetic).  You will be awake during the procedure. You will need to be able to talk with the health care provider during the procedure.  With the help of a type of X-ray (fluoroscopy), the health care provider will insert a radiofrequency needle into the area to be treated.  Next, a wire that carries the radio waves (electrode) will be put through the radiofrequency needle. An electrical pulse will be sent through the electrode to verify  the correct nerve. You will feel a tingling sensation, and you may have muscle twitching.  Then, the tissue that is around the needle tip will be heated by an electric current that is passed using the radiofrequency machine. This will numb the nerves.  A bandage (dressing) will be put on the insertion area after the procedure is done. The procedure may vary among health care providers and hospitals. What happens after the procedure?  Your blood pressure, heart rate, breathing rate, and blood oxygen level will be monitored often until the medicines you were given have worn off.  Return to your normal activities as directed by  your health care provider. This information is not intended to replace advice given to you by your health care provider. Make sure you discuss any questions you have with your health care provider. Document Released: 02/27/2011 Document Revised: 12/07/2015 Document Reviewed: 08/08/2014 Elsevier Interactive Patient Education  2018 [place] Radiofrequency Lesioning, Care After Refer to this sheet in the next few weeks. These instructions provide you with information about caring for yourself after your procedure. Your health care provider may also give you more specific instructions. Your treatment has been planned according to current medical practices, but problems sometimes occur. Call your health care provider if you have any problems or questions after your procedure. What can I expect after the procedure? After the procedure, it is common to have:  Pain from the burned nerve.  Temporary numbness.  Follow these instructions at home:  Take over-the-counter and prescription medicines only as told by your health care provider.  Return to your normal activities as told by your health care provider. Ask your health care provider what activities are safe for you.  Pay close attention to how you feel after the procedure. If you start to have pain, write down when it hurts and how it feels. This will help you and your health care provider to know if you need an additional treatment.  Check your needle insertion site every day for signs of infection. Watch for: ? Redness, swelling, or pain. ? Fluid, blood, or pus.  Keep all follow-up visits as told by your health care provider. This is important. Contact a health care provider if:  Your pain does not get better.  You have redness, swelling, or pain at the needle insertion site.  You have fluid, blood, or pus coming from the needle insertion site.  You have a fever. Get help right away if:  You develop sudden, severe pain.  You  develop numbness or tingling near the procedure site that does not go away. This information is not intended to replace advice given to you by your health care provider. Make sure you discuss any questions you have with your health care provider. Document Released: 02/28/2011 Document Revised: 12/07/2015 Document Reviewed: 08/08/2014 Elsevier Interactive Patient Education  2018 [place]  What are the risk, side effects and possible complications? Generally speaking, most procedures are safe.  However, with any procedure there are risks, side effects, and the possibility of complications.  The risks and complications are dependent upon the sites that are lesioned, or the type of nerve block to be performed.  The closer the procedure is to the spine, the more serious the risks are.  Great care is taken when placing the radio frequency needles, block needles or lesioning probes, but sometimes complications can occur. 1. Infection: Any time there is an injection through the skin, there is a risk  of infection.  This is why sterile conditions are used for these blocks.  There are four possible types of infection. 1. Localized skin infection. 2. Central Nervous System Infection-This can be in the form of Meningitis, which can be deadly. 3. Epidural Infections-This can be in the form of an epidural abscess, which can cause pressure inside of the spine, causing compression of the spinal cord with subsequent paralysis. This would require an emergency surgery to decompress, and there are no guarantees that the patient would recover from the paralysis. 4. Discitis-This is an infection of the intervertebral discs.  It occurs in about 1% of discography procedures.  It is difficult to treat and it may lead to surgery.        2. Pain: the needles have to go through skin and soft tissues, will cause soreness.       3. Damage to internal structures:  The nerves to be lesioned may  be near blood vessels or    other nerves which can be potentially damaged.       4. Bleeding: Bleeding is more common if the patient is taking blood thinners such as  aspirin, Coumadin, Ticiid, Plavix, etc., or if he/she have some genetic predisposition  such as hemophilia. Bleeding into the spinal canal can cause compression of the spinal  cord with subsequent paralysis.  This would require an emergency surgery to  decompress and there are no guarantees that the patient would recover from the  paralysis.       5. Pneumothorax:  Puncturing of a lung is a possibility, every time a needle is introduced in  the area of the chest or upper back.  Pneumothorax refers to free air around the  collapsed lung(s), inside of the thoracic cavity (chest cavity).  Another two possible  complications related to a similar event would include: Hemothorax and Chylothorax.   These are variations of the Pneumothorax, where instead of air around the collapsed  lung(s), you may have blood or chyle, respectively.       6. Spinal headaches: They may occur with any procedures in the area of the spine.       7. Persistent CSF (Cerebro-Spinal Fluid) leakage: This is a rare problem, but may occur  with prolonged intrathecal or epidural catheters either due to the formation of a fistulous  track or a dural tear.       8. Nerve damage: By working so close to the spinal cord, there is always a possibility of  nerve damage, which could be as serious as a permanent spinal cord injury with  paralysis.       9. Death:  Although rare, severe deadly allergic reactions known as "Anaphylactic  reaction" can occur to any of the medications used.      10. Worsening of the symptoms:  We can always make thing worse.  What are the chances of something like this happening? Chances of any of this occuring are extremely low.  By statistics, you have more of a chance of getting killed in a motor vehicle accident: while driving to the hospital than any  of the above occurring .  Nevertheless, you should be aware that they are possibilities.  In general, it is similar to taking a shower.  Everybody knows that you can slip, hit your head and get killed.  Does that mean that you should not shower again?  Nevertheless always keep in mind that statistics do not mean anything if you happen to  be on the wrong side of them.  Even if a procedure has a 1 (one) in a 1,000,000 (million) chance of going wrong, it you happen to be that one..Also, keep in mind that by statistics, you have more of a chance of having something go wrong when taking medications.  Who should not have this procedure? If you are on a blood thinning medication (e.g. Coumadin, Plavix, see list of "Blood Thinners"), or if you have an active infection going on, you should not have the procedure.  If you are taking any blood thinners, please inform your physician.  How should I prepare for this procedure?  Do not eat or drink anything at least six hours prior to the procedure.  Bring a driver with you .  It cannot be a taxi.  Come accompanied by an adult that can drive you back, and that is strong enough to help you if your legs get weak or numb from the local anesthetic.  Take all of your medicines the morning of the procedure with just enough water to swallow them.  If you have diabetes, make sure that you are scheduled to have your procedure done first thing in the morning, whenever possible.  If you have diabetes, take only half of your insulin dose and notify our nurse that you have done so as soon as you arrive at the clinic.  If you are diabetic, but only take blood sugar pills (oral hypoglycemic), then do not take them on the morning of your procedure.  You may take them after you have had the procedure.  Do not take aspirin or any aspirin-containing medications, at least eleven (11) days prior to the procedure.  They may prolong bleeding.  Wear loose fitting clothing that may  be easy to take off and that you would not mind if it got stained with Betadine or blood.  Do not wear any jewelry or perfume  Remove any nail coloring.  It will interfere with some of our monitoring equipment.  NOTE: Remember that this is not meant to be interpreted as a complete list of all possible complications.  Unforeseen problems may occur.  BLOOD THINNERS The following drugs contain aspirin or other products, which can cause increased bleeding during surgery and should not be taken for 2 weeks prior to and 1 week after surgery.  If you should need take something for relief of minor pain, you may take acetaminophen which is found in Tylenol,m Datril, Anacin-3 and Panadol. It is not blood thinner. The products listed below are.  Do not take any of the products listed below in addition to any listed on your instruction sheet.  A.P.C or A.P.C with Codeine Codeine Phosphate Capsules #3 Ibuprofen Ridaura  ABC compound Congesprin Imuran rimadil  Advil Cope Indocin Robaxisal  Alka-Seltzer Effervescent Pain Reliever and Antacid Coricidin or Coricidin-D  Indomethacin Rufen  Alka-Seltzer plus Cold Medicine Cosprin Ketoprofen S-A-C Tablets  Anacin Analgesic Tablets or Capsules Coumadin Korlgesic Salflex  Anacin Extra Strength Analgesic tablets or capsules CP-2 Tablets Lanoril Salicylate  Anaprox Cuprimine Capsules Levenox Salocol  Anexsia-D Dalteparin Magan Salsalate  Anodynos Darvon compound Magnesium Salicylate Sine-off  Ansaid Dasin Capsules Magsal Sodium Salicylate  Anturane Depen Capsules Marnal Soma  APF Arthritis pain formula Dewitt's Pills Measurin Stanback  Argesic Dia-Gesic Meclofenamic Sulfinpyrazone  Arthritis Bayer Timed Release Aspirin Diclofenac Meclomen Sulindac  Arthritis pain formula Anacin Dicumarol Medipren Supac  Analgesic (Safety coated) Arthralgen Diffunasal Mefanamic Suprofen  Arthritis Strength Bufferin Dihydrocodeine Mepro Compound Suprol  Arthropan liquid  Dopirydamole Methcarbomol with Aspirin Synalgos  ASA tablets/Enseals Disalcid Micrainin Tagament  Ascriptin Doan's Midol Talwin  Ascriptin A/D Dolene Mobidin Tanderil  Ascriptin Extra Strength Dolobid Moblgesic Ticlid  Ascriptin with Codeine Doloprin or Doloprin with Codeine Momentum Tolectin  Asperbuf Duoprin Mono-gesic Trendar  Aspergum Duradyne Motrin or Motrin IB Triminicin  Aspirin plain, buffered or enteric coated Durasal Myochrisine Trigesic  Aspirin Suppositories Easprin Nalfon Trillsate  Aspirin with Codeine Ecotrin Regular or Extra Strength Naprosyn Uracel  Atromid-S Efficin Naproxen Ursinus  Auranofin Capsules Elmiron Neocylate Vanquish  Axotal Emagrin Norgesic Verin  Azathioprine Empirin or Empirin with Codeine Normiflo Vitamin E  Azolid Emprazil Nuprin Voltaren  Bayer Aspirin plain, buffered or children's or timed BC Tablets or powders Encaprin Orgaran Warfarin Sodium  Buff-a-Comp Enoxaparin Orudis Zorpin  Buff-a-Comp with Codeine Equegesic Os-Cal-Gesic   Buffaprin Excedrin plain, buffered or Extra Strength Oxalid   Bufferin Arthritis Strength Feldene Oxphenbutazone   Bufferin plain or Extra Strength Feldene Capsules Oxycodone with Aspirin   Bufferin with Codeine Fenoprofen Fenoprofen Pabalate or Pabalate-SF   Buffets II Flogesic Panagesic   Buffinol plain or Extra Strength Florinal or Florinal with Codeine Panwarfarin   Buf-Tabs Flurbiprofen Penicillamine   Butalbital Compound Four-way cold tablets Penicillin   Butazolidin Fragmin Pepto-Bismol   Carbenicillin Geminisyn Percodan   Carna Arthritis Reliever Geopen Persantine   Carprofen Gold's salt Persistin   Chloramphenicol Goody's Phenylbutazone   Chloromycetin Haltrain Piroxlcam   Clmetidine heparin Plaquenil   Cllnoril Hyco-pap Ponstel   Clofibrate Hydroxy chloroquine Propoxyphen         Before stopping any of these medications, be sure to consult the physician who ordered them.  Some, such as Coumadin (Warfarin)  are ordered to prevent or treat serious conditions such as "deep thrombosis", "pumonary embolisms", and other heart problems.  The amount of time that you may need off of the medication may also vary with the medication and the reason for which you were taking it.  If you are taking any of these medications, please make sure you notify your pain physician before you undergo any procedures.

## 2017-01-13 NOTE — Progress Notes (Signed)
P feeling nauseated- no emesis noted  1141 Glycopyrrolate 0.2mg  given IV via rt antecub, Zofran 4mg  1142 IV given - Both per verbal order

## 2017-01-13 NOTE — Progress Notes (Signed)
Safety precautions to be maintained throughout the outpatient stay will include: orient to surroundings, keep bed in low position, maintain call bell within reach at all times, provide assistance with transfer out of bed and ambulation.  

## 2017-01-14 ENCOUNTER — Telehealth: Payer: Self-pay

## 2017-01-14 NOTE — Telephone Encounter (Signed)
Post procedure phone call.  Patient states she is sore, but is doing good.

## 2017-01-14 NOTE — Telephone Encounter (Signed)
Post procedure phone call.  Patient is in the bathroom .  Will call back.

## 2017-01-23 ENCOUNTER — Encounter: Payer: Self-pay | Admitting: Nurse Practitioner

## 2017-01-23 ENCOUNTER — Ambulatory Visit: Payer: Medicaid Other | Attending: Nurse Practitioner | Admitting: Nurse Practitioner

## 2017-01-23 VITALS — BP 129/80 | HR 87 | Temp 98.4°F | Resp 18 | Ht 65.0 in | Wt 250.0 lb

## 2017-01-23 DIAGNOSIS — F17210 Nicotine dependence, cigarettes, uncomplicated: Secondary | ICD-10-CM | POA: Insufficient documentation

## 2017-01-23 DIAGNOSIS — G8929 Other chronic pain: Secondary | ICD-10-CM

## 2017-01-23 DIAGNOSIS — Z79899 Other long term (current) drug therapy: Secondary | ICD-10-CM | POA: Diagnosis not present

## 2017-01-23 DIAGNOSIS — Z5181 Encounter for therapeutic drug level monitoring: Secondary | ICD-10-CM | POA: Diagnosis not present

## 2017-01-23 DIAGNOSIS — M791 Myalgia: Secondary | ICD-10-CM

## 2017-01-23 DIAGNOSIS — Z79891 Long term (current) use of opiate analgesic: Secondary | ICD-10-CM | POA: Diagnosis not present

## 2017-01-23 DIAGNOSIS — M62830 Muscle spasm of back: Secondary | ICD-10-CM | POA: Diagnosis not present

## 2017-01-23 DIAGNOSIS — M5412 Radiculopathy, cervical region: Secondary | ICD-10-CM | POA: Insufficient documentation

## 2017-01-23 DIAGNOSIS — R531 Weakness: Secondary | ICD-10-CM | POA: Diagnosis not present

## 2017-01-23 DIAGNOSIS — M160 Bilateral primary osteoarthritis of hip: Secondary | ICD-10-CM | POA: Insufficient documentation

## 2017-01-23 DIAGNOSIS — M533 Sacrococcygeal disorders, not elsewhere classified: Secondary | ICD-10-CM | POA: Insufficient documentation

## 2017-01-23 DIAGNOSIS — J302 Other seasonal allergic rhinitis: Secondary | ICD-10-CM | POA: Diagnosis not present

## 2017-01-23 DIAGNOSIS — E039 Hypothyroidism, unspecified: Secondary | ICD-10-CM | POA: Insufficient documentation

## 2017-01-23 DIAGNOSIS — Z96612 Presence of left artificial shoulder joint: Secondary | ICD-10-CM | POA: Diagnosis not present

## 2017-01-23 DIAGNOSIS — E538 Deficiency of other specified B group vitamins: Secondary | ICD-10-CM | POA: Insufficient documentation

## 2017-01-23 DIAGNOSIS — M4310 Spondylolisthesis, site unspecified: Secondary | ICD-10-CM | POA: Diagnosis not present

## 2017-01-23 DIAGNOSIS — G894 Chronic pain syndrome: Secondary | ICD-10-CM | POA: Diagnosis not present

## 2017-01-23 DIAGNOSIS — Z6841 Body Mass Index (BMI) 40.0 and over, adult: Secondary | ICD-10-CM | POA: Diagnosis not present

## 2017-01-23 DIAGNOSIS — I10 Essential (primary) hypertension: Secondary | ICD-10-CM | POA: Diagnosis not present

## 2017-01-23 DIAGNOSIS — M47816 Spondylosis without myelopathy or radiculopathy, lumbar region: Secondary | ICD-10-CM

## 2017-01-23 DIAGNOSIS — Z96611 Presence of right artificial shoulder joint: Secondary | ICD-10-CM | POA: Diagnosis not present

## 2017-01-23 DIAGNOSIS — M797 Fibromyalgia: Secondary | ICD-10-CM | POA: Diagnosis not present

## 2017-01-23 DIAGNOSIS — M4802 Spinal stenosis, cervical region: Secondary | ICD-10-CM | POA: Diagnosis not present

## 2017-01-23 DIAGNOSIS — F329 Major depressive disorder, single episode, unspecified: Secondary | ICD-10-CM | POA: Diagnosis not present

## 2017-01-23 DIAGNOSIS — J449 Chronic obstructive pulmonary disease, unspecified: Secondary | ICD-10-CM | POA: Diagnosis not present

## 2017-01-23 DIAGNOSIS — M7918 Myalgia, other site: Secondary | ICD-10-CM

## 2017-01-23 DIAGNOSIS — M4696 Unspecified inflammatory spondylopathy, lumbar region: Secondary | ICD-10-CM | POA: Diagnosis not present

## 2017-01-23 MED ORDER — DICLOFENAC SODIUM 1 % TD GEL: 2.0000 g | Freq: Four times a day (QID) | TRANSDERMAL | 2 refills | Status: DC

## 2017-01-23 MED ORDER — CYCLOBENZAPRINE HCL 10 MG PO TABS: 10.0000 mg | ORAL_TABLET | Freq: Three times a day (TID) | ORAL | 2 refills | Status: DC | PRN

## 2017-01-23 MED ORDER — OXYCODONE HCL 10 MG PO TABS: 10.0000 mg | ORAL_TABLET | Freq: Four times a day (QID) | ORAL | 0 refills | Status: DC | PRN

## 2017-01-23 NOTE — Patient Instructions (Signed)

## 2017-01-23 NOTE — Progress Notes (Signed)
Nursing Pain Medication Assessment:  Safety precautions to be maintained throughout the outpatient stay will include: orient to surroundings, keep bed in low position, maintain call bell within reach at all times, provide assistance with transfer out of bed and ambulation.  Medication Inspection Compliance: Pill count conducted under aseptic conditions, in front of the patient. Neither the pills nor the bottle was removed from the patient's sight at any time. Once count was completed pills were immediately returned to the patient in their original bottle.  Medication #1: Oxycodone IR Pill/Patch Count: 27 of 120 pills remain Pill/Patch Appearance: Markings consistent with prescribed medication Bottle Appearance: Standard pharmacy container. Clearly labeled. Filled Date: 06 / 18 / 2018 Last Medication intake:  Today  Medication #2: Hydrocodone/APAP Pill/Patch Count: 7 of 21 pills remain Pill/Patch Appearance: Markings consistent with prescribed medication Bottle Appearance: Standard pharmacy container. Clearly labeled. Filled Date: 07 / 02 / 2018 Last Medication intake:  Today

## 2017-01-23 NOTE — Progress Notes (Signed)
Patient's Name: Danielle [name]  MRN: [id number]  Referring Provider: Marygrace Drought, MD  DOB: 1961/04/01  PCP: Marygrace Drought, MD  DOS: 01/23/2017  Note by: Vevelyn Francois NP  Service setting: Ambulatory outpatient  Specialty: Interventional Pain Management  Location: [facility] (AMB) Pain Management Facility    Patient type: Established    Primary Reason(s) for Visit: Encounter for prescription drug management. (Level of risk: moderate)  CC: Back Pain (right to right buttock)  HPI  [name] is a 56 y.o. year old, female patient, who comes today for a medication management evaluation. She has Long term current use of opiate analgesic; Long term prescription opiate use; Opiate use (60 MME/Day); B12 deficiency; Cervical spinal cord compression [facility]) (April, 2017); Cervical spinal stenosis; Chronic obstructive pulmonary disease ([place]); Clinical depression; BP (high blood pressure); Adult hypothyroidism; Adiposity; Mucositis oral; Groin pain; Current tobacco use; Chronic shoulder pain (Location of Primary Source of Pain) (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); History of total shoulder replacement (Bilateral); Chronic hip pain (Location of Secondary source of pain) (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); 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); Chronic low back pain (Location of Tertiary source of pain) (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); Lumbar facet syndrome (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); Lumbar spondylosis; Failed back surgical syndrome; Chronic lower extremity pain (Right); Chronic neck pain (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); Chronic cervical radicular pain (Right); Ulnar neuropathy of left upper extremity; Hx of cervical spine surgery; Cervical spondylosis; Encounter for therapeutic drug level monitoring; Encounter for pain management planning; Fibromyalgia; Chronic sacroiliac pain (B) (R>L); Seasonal allergic rhinitis; Status post total replacement of right shoulder; Depression; Hypertension; Hypothyroid; Chronic pain syndrome; Complaints of weakness of lower extremity; Morbid obesity  with BMI of 40.0-44.9, adult ([place]); Anterolisthesis; Cervical facet syndrome ([place]); Chronic musculoskeletal pain; Muscle spasm of back; Encounter for screening colonoscopy; Rectal bleeding; Mixed simple and mucopurulent chronic bronchitis ([place]); Acute postoperative pain; Nausea; and Vasovagal episode on her problem list. Her primarily concern today is the Back Pain (right to right buttock)  Pain Assessment: Location: Right, Lower Back Radiating: right buttock Onset: More than a month ago Duration: Chronic pain Quality: Stabbing, Constant, Aching, Dull Severity: 2 /10 (self-reported pain score)  Note: Reported level is compatible with observation.                   Effect on ADL:   Timing: Constant Modifying factors: medications  [name] was last scheduled for an appointment on 10/24/2016 for medication management. During today's appointment we reviewed [name]'s chronic pain status, as well as her outpatient medication regimen. She denies any numbness or tingling in her legs. She admit that the she has pain at the injection site. She admits that she having along of cramps. She states that the Norco is making her itch.  The patient  reports that she does not use drugs. Her body mass index is 41.6 kg/m.  Further details on both, my assessment(s), as well as the proposed treatment plan, please see below.  Controlled Substance Pharmacotherapy Assessment REMS (Risk Evaluation and Mitigation Strategy)  Analgesic: Oxycodone IR 10 mg every 6 hours (40 mg/day of oxycodone) MME/day: 60 mg/day  [name], RN  01/23/2017  1:27 PM  Sign at close encounter Nursing Pain Medication Assessment:  Safety precautions to be maintained throughout the outpatient stay will include: orient to surroundings, keep bed in low position, maintain call bell within reach at all times, provide assistance with transfer out of bed and ambulation.  Medication Inspection Compliance: Pill count conducted under  aseptic conditions, in front of the patient. Neither  the pills nor the bottle was removed from the patient's sight at any time. Once count was completed pills were immediately returned to the patient in their original bottle.  Medication #1: Oxycodone IR Pill/Patch Count: 27 of 120 pills remain Pill/Patch Appearance: Markings consistent with prescribed medication Bottle Appearance: Standard pharmacy container. Clearly labeled. Filled Date: 06 / 18 / 2018 Last Medication intake:  Today  Medication #2: Hydrocodone/APAP Pill/Patch Count: 7 of 21 pills remain Pill/Patch Appearance: Markings consistent with prescribed medication Bottle Appearance: Standard pharmacy container. Clearly labeled. Filled Date: 07 / 02 / 2018 Last Medication intake:  Today   Pharmacokinetics: Liberation and absorption (onset of action): WNL Distribution (time to peak effect): WNL Metabolism and excretion (duration of action): WNL         Pharmacodynamics: Desired effects: Analgesia: [name] reports >50% benefit. Functional ability: Patient reports that medication allows her to accomplish basic ADLs Clinically meaningful improvement in function (CMIF): Sustained CMIF goals met Perceived effectiveness: Described as relatively effective, allowing for increase in activities of daily living (ADL) Undesirable effects: Side-effects or Adverse reactions: None reported Monitoring: [place] PMP: Online review of the past 71-monthperiod conducted. Compliant with practice rules and regulations List of all UDS test(s) done:  Lab Results  Component Value Date   TOXASSSELUR FINAL 04/04/2016   SUMMARY FINAL 01/02/2016   Last UDS on record: ToxAssure Select 13  Date Value Ref Range Status  04/04/2016 FINAL  Final    Comment:    ==================================================================== TOXASSURE SELECT 13 (MW) ==================================================================== Test                              Result       Flag       Units Drug Present and Declared for Prescription Verification   Oxycodone                      4030         EXPECTED   ng/mg creat   Oxymorphone                    202          EXPECTED   ng/mg creat   Noroxycodone                   6337         EXPECTED   ng/mg creat   Noroxymorphone                 77           EXPECTED   ng/mg creat    Sources of oxycodone are scheduled prescription medications.    Oxymorphone, noroxycodone, and noroxymorphone are expected    metabolites of oxycodone. Oxymorphone is also available as a    scheduled prescription medication. ==================================================================== Test                      Result    Flag   Units      Ref Range   Creatinine              86               mg/dL      >=20 ==================================================================== Declared Medications:  The flagging and interpretation on this report are based on the  following declared medications.  Unexpected results may arise from  inaccuracies in  the declared medications.  **Note: The testing scope of this panel includes these medications:  Oxycodone  **Note: The testing scope of this panel does not include following  reported medications:  Albuterol  Cetirizine (Zyrtec)  Citalopram (Celexa)  Diclofenac (Voltaren)  Levothyroxine (Synthroid)  Magnesium  Montelukast (Singulair)  Naloxone (Narcan)  Nystatin  Tiotropium (Spiriva)  Vitamin B12  Vitamin D3 ==================================================================== For clinical consultation, please call [phone]. ====================================================================    Summary  Date Value Ref Range Status  01/02/2016 FINAL  Final    Comment:    ==================================================================== TOXASSURE COMP DRUG ANALYSIS,UR ==================================================================== Test                              Result       Flag       Units Drug Present and Declared for Prescription Verification   Oxycodone                      3985         EXPECTED   ng/mg creat   Oxymorphone                    451          EXPECTED   ng/mg creat   Noroxycodone                   >4464        EXPECTED   ng/mg creat   Noroxymorphone                 107          EXPECTED   ng/mg creat    Sources of oxycodone are scheduled prescription medications.    Oxymorphone, noroxycodone, and noroxymorphone are expected    metabolites of oxycodone. Oxymorphone is also available as a    scheduled prescription medication.   Citalopram                     PRESENT      EXPECTED   Desmethylcitalopram            PRESENT      EXPECTED    Desmethylcitalopram is an expected metabolite of citalopram or    the enantiomeric form, escitalopram.   Acetaminophen                  PRESENT      EXPECTED Drug Present not Declared for Prescription Verification   Tramadol                       PRESENT      UNEXPECTED   O-Desmethyltramadol            PRESENT      UNEXPECTED   N-Desmethyltramadol            PRESENT      UNEXPECTED    Source of tramadol is a prescription medication.    O-desmethyltramadol and N-desmethyltramadol are expected    metabolites of tramadol.   Diphenhydramine                PRESENT      UNEXPECTED Drug Absent but Declared for Prescription Verification   Alprazolam                     Not Detected UNEXPECTED ng/mg creat   Diclofenac  Not Detected UNEXPECTED    Diclofenac, as indicated in the declared medication list, is not    always detected even when used as directed. ==================================================================== Test                      Result    Flag   Units      Ref Range   Creatinine              224              mg/dL      >=20 ==================================================================== Declared Medications:  The flagging and interpretation on this  report are based on the  following declared medications.  Unexpected results may arise from  inaccuracies in the declared medications.  **Note: The testing scope of this panel includes these medications:  Alprazolam (Xanax)  Citalopram (Celexa)  Oxycodone  **Note: The testing scope of this panel does not include small to  moderate amounts of these reported medications:  Acetaminophen (Tylenol)  Diclofenac (Voltaren)  **Note: The testing scope of this panel does not include following  reported medications:  Albuterol (ProAir HFA)  Albuterol (Proventil)  Docusate (Colace)  Levothyroxine (Synthroid)  Loratadine (Claritin)  Magnesium (Milk of Magnesia)  Montelukast (Singulair)  Nystatin (Mycostatin)  Ondansetron (Zofran)  Tiotropium (Spiriva)  Vitamin B12  Vitamin D3 ==================================================================== For clinical consultation, please call [phone]. ====================================================================    UDS interpretation: Compliant          Medication Assessment Form: Reviewed. Patient indicates being compliant with therapy Treatment compliance: Compliant Risk Assessment Profile: Aberrant behavior: See prior evaluations. None observed or detected today Comorbid factors increasing risk of overdose: See prior notes. No additional risks detected today Risk of substance use disorder (SUD): Low Opioid Risk Tool (ORT) Total Score:    Interpretation Table:  Score <3 = Low Risk for SUD  Score between 4-7 = Moderate Risk for SUD  Score >8 = High Risk for Opioid Abuse   Risk Mitigation Strategies:  Patient Counseling: Covered Patient-Prescriber Agreement (PPA): Present and active  Notification to other healthcare providers: Done  Pharmacologic Plan: No change in therapy, at this time  Laboratory Chemistry  Inflammation Markers (CRP: Acute Phase) (ESR: Chronic Phase) Lab Results  Component Value Date   CRP 0.9  01/08/2016   ESRSEDRATE 37 (H) 01/08/2016                 Renal Function Markers Lab Results  Component Value Date   BUN 8 01/08/2016   CREATININE 0.94 01/08/2016   GFRAA >60 01/08/2016   GFRNONAA >60 01/08/2016                 Hepatic Function Markers Lab Results  Component Value Date   AST 18 01/08/2016   ALT 14 01/08/2016   ALBUMIN 3.7 01/08/2016   ALKPHOS 79 01/08/2016                 Electrolytes Lab Results  Component Value Date   NA 137 01/08/2016   K 3.5 01/08/2016   CL 101 01/08/2016   CALCIUM 9.0 01/08/2016   MG 2.1 01/08/2016                 Neuropathy Markers Lab Results  Component Value Date   VITAMINB12 >7,500 (H) 01/08/2016                 Bone Pathology Markers Lab Results  Component Value Date   ALKPHOS 79 01/08/2016  25OHVITD1 32 01/08/2016   25OHVITD2 2.0 01/08/2016   25OHVITD3 30 01/08/2016   CALCIUM 9.0 01/08/2016                 Coagulation Parameters No results found for: INR, LABPROT, APTT, PLT               Cardiovascular Markers No results found for: BNP, HGB, HCT               Note: Lab results reviewed.  Recent Diagnostic Imaging Review  Dg C-arm 1-60 Min-no Report  Result Date: 01/13/2017 Fluoroscopy was utilized by the requesting physician.  No radiographic interpretation.   Note: Imaging results reviewed.          Meds   Current Meds  Medication Sig  . albuterol (PROVENTIL HFA;VENTOLIN HFA) 108 (90 Base) MCG/ACT inhaler Inhale 2 puffs into the lungs every 4 (four) hours as needed for wheezing or shortness of breath.   . cetirizine (ZYRTEC) 10 MG tablet Take 10 mg by mouth daily.   . Cholecalciferol (VITAMIN D3) 2000 units capsule Take 2,000 Units by mouth daily.   . citalopram (CELEXA) 40 MG tablet Take 40 mg by mouth daily.  . cyanocobalamin (,VITAMIN B-12,) 1000 MCG/ML injection Inject into the muscle.  Derrill Memo ON 01/29/2017] diclofenac sodium (VOLTAREN) 1 % GEL Apply 2 g topically 4 (four) times daily.  Marland Kitchen  docusate sodium (COLACE) 100 MG capsule Take 100 mg by mouth 2 (two) times daily.  . fluconazole (DIFLUCAN) 100 MG tablet TAKE TWO ON THE FIRST DAY. THEN ONE DAILY FOR 14 DAYS.  . HYDROcodone-acetaminophen (NORCO/VICODIN) 5-325 MG tablet Take 1 tablet by mouth every 8 (eight) hours as needed for severe pain.  Marland Kitchen levothyroxine (SYNTHROID, LEVOTHROID) 50 MCG tablet TAKE 1 TABLET (50 MCG TOTAL) BY MOUTH DAILY AT 0600.  Marland Kitchen loratadine (CLARITIN) 10 MG tablet TAKE 1 TABLET (10 MG TOTAL) BY MOUTH DAILY.  . magnesium hydroxide (MILK OF MAGNESIA) 400 MG/5ML suspension Take by mouth.  . montelukast (SINGULAIR) 10 MG tablet TAKE 1 TABLET (10 MG TOTAL) BY MOUTH NIGHTLY.  . naloxone (NARCAN) 2 MG/2ML injection Inject content of syringe into thigh muscle. Call 911.  . nystatin (MYCOSTATIN/NYSTOP) powder Apply topically.  . polyethylene glycol powder (GLYCOLAX/MIRALAX) powder 255 grams one bottle for colonoscopy prep  . SPIRIVA HANDIHALER 18 MCG inhalation capsule PLACE 1 CAPSULE (18 MCG TOTAL) INTO INHALER AND INHALE ONCE DAILY.  . [DISCONTINUED] diclofenac sodium (VOLTAREN) 1 % GEL Apply 2 g topically 4 (four) times daily.   . [DISCONTINUED] Oxycodone HCl 10 MG TABS Take 1 tablet (10 mg total) by mouth every 6 (six) hours as needed.    ROS  Constitutional: Denies any fever or chills Gastrointestinal: No reported hemesis, hematochezia, vomiting, or acute GI distress Musculoskeletal: Denies any acute onset joint swelling, redness, loss of ROM, or weakness Neurological: No reported episodes of acute onset apraxia, aphasia, dysarthria, agnosia, amnesia, paralysis, loss of coordination, or loss of consciousness  Allergies  Ms. Torti is allergic to aripiprazole; duloxetine; gabapentin; gold; iodinated diagnostic agents; naproxen sodium; nortriptyline hcl; nsaids; pregabalin; ace inhibitors; aspirin; cefpodoxime; fluoxetine; fluticasone-salmeterol; levofloxacin; lithium; meperidine; paroxetine; tape;  telithromycin; theophylline; topiramate; trazodone; triamcinolone; venlafaxine; bupropion; ketorolac tromethamine; morphine; and moxifloxacin.  [place]  Drug: Ms. Schroyer  reports that she does not use drugs. Alcohol:  reports that she does not drink alcohol. Tobacco:  reports that she has been smoking Cigarettes.  She has a 40.00 pack-year smoking history. She has never used smokeless  tobacco. Medical:  has a past medical history of Anxiety; Arthritis; Asthma; Bell's palsy; Bursitis; COPD (chronic obstructive pulmonary disease) ([place]); Depression; Fibromyalgia; Heart murmur; Hepatitis C; Hiatal hernia; Hyperlipidemia; Hypertension; IBS (irritable bowel syndrome); Insomnia; and Thyroid disease. Surgical: Ms. Rau  has a past surgical history that includes necksurgery (10/21/2014); Back surgery (2013); Foot surgery (Right); Elbow surgery (Left); Carpal tunnel release (Right); Nose surgery; Partial hysterectomy; and Total shoulder replacement (Bilateral). Family: family history includes Cancer in her mother; Gout in her mother; Heart disease in her father.  Constitutional Exam  General appearance: Well nourished, well developed, and well hydrated. In no apparent acute distress Vitals:   01/23/17 1316  BP: 129/80  Pulse: 87  Resp: 18  Temp: 98.4 F (36.9 C)  TempSrc: Oral  SpO2: 98%  Weight: 250 lb (113.4 kg)  Height: '5\' 5"'$  (1.651 m)   BMI Assessment: Estimated body mass index is 41.6 kg/m as calculated from the following:   Height as of this encounter: '5\' 5"'$  (1.651 m).   Weight as of this encounter: 250 lb (113.4 kg).  BMI interpretation table: BMI level Category Range association with higher incidence of chronic pain  <18 kg/m2 Underweight   18.5-24.9 kg/m2 Ideal body weight   25-29.9 kg/m2 Overweight Increased incidence by 20%  30-34.9 kg/m2 Obese (Class I) Increased incidence by 68%  35-39.9 kg/m2 Severe obesity (Class II) Increased incidence by 136%  >40 kg/m2 Extreme obesity  (Class III) Increased incidence by 254%   BMI Readings from Last 4 Encounters:  01/23/17 41.60 kg/m  01/13/17 41.27 kg/m  11/25/16 41.27 kg/m  10/30/16 40.10 kg/m   Wt Readings from Last 4 Encounters:  01/23/17 250 lb (113.4 kg)  01/13/17 248 lb (112.5 kg)  11/25/16 248 lb (112.5 kg)  10/30/16 241 lb (109.3 kg)  Psych/Mental status: Alert, oriented x 3 (person, place, & time)       Eyes: PERLA Respiratory: No evidence of acute respiratory distress  Cervical Spine Exam  Inspection: No masses, redness, or swelling Alignment: Symmetrical Functional ROM: Unrestricted ROM      Stability: No instability detected Muscle strength & Tone: Functionally intact Sensory: Unimpaired Palpation: No palpable anomalies              Upper Extremity (UE) Exam    Side: Right upper extremity  Side: Left upper extremity  Inspection: No masses, redness, swelling, or asymmetry. No contractures  Inspection: No masses, redness, swelling, or asymmetry. No contractures  Functional ROM: Unrestricted ROM          Functional ROM: Unrestricted ROM          Muscle strength & Tone: Functionally intact  Muscle strength & Tone: Functionally intact  Sensory: Unimpaired  Sensory: Unimpaired  Palpation: No palpable anomalies              Palpation: No palpable anomalies              Specialized Test(s): Deferred         Specialized Test(s): Deferred          Thoracic Spine Exam  Inspection: No masses, redness, or swelling Alignment: Symmetrical Functional ROM: Unrestricted ROM Stability: No instability detected Sensory: Unimpaired Muscle strength & Tone: No palpable anomalies  Lumbar Spine Exam  Inspection: No masses, redness, or swelling Alignment: Symmetrical Functional ROM: Unrestricted ROM      Stability: No instability detected Muscle strength & Tone: Functionally intact Sensory: Unimpaired Palpation: Complains of area being tender to palpation  Provocative Tests: Lumbar Hyperextension and  rotation test: Positive on the right for facet joint pain. Patrick's Maneuver: evaluation deferred today                    Gait & Posture Assessment  Ambulation: Unassisted Gait: Relatively normal for age and body habitus Posture: WNL   Lower Extremity Exam    Side: Right lower extremity  Side: Left lower extremity  Inspection: No masses, redness, swelling, or asymmetry. No contractures  Inspection: No masses, redness, swelling, or asymmetry. No contractures  Functional ROM: Unrestricted ROM          Functional ROM: Unrestricted ROM          Muscle strength & Tone: Functionally intact  Muscle strength & Tone: Functionally intact  Sensory: Unimpaired  Sensory: Unimpaired  Palpation: No palpable anomalies  Palpation: No palpable anomalies   Assessment  Primary Diagnosis & Pertinent Problem List: The primary encounter diagnosis was Lumbar spondylosis. Diagnoses of Lumbar facet syndrome (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), Chronic musculoskeletal pain, Muscle spasm of back, Chronic pain syndrome, and Long term current use of opiate analgesic were also pertinent to this visit.  Status Diagnosis  Controlled Controlled Controlled 1. Lumbar spondylosis   2. 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 Chronic musculoskeletal pain   4. Muscle spasm of back   5. Chronic pain syndrome   6. Long term current use of opiate analgesic     Problems updated and reviewed during this visit: Problem  Cervical facet syndrome (HCC)  Chronic Musculoskeletal Pain  Muscle Spasm of Back  Chronic Pain Syndrome  Complaints of Weakness of Lower Extremity  Anterolisthesis  Chronic sacroiliac pain (B) (R>L)  Chronic shoulder pain (Location of Primary Source of Pain) (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)  History of total shoulder replacement (Bilateral)  Chronic hip pain (Location of Secondary source of pain) (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)  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)  Chronic low back pain (Location of Tertiary source of pain) (Bilateral)  (R>L)  Lumbar facet syndrome (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)  Lumbar Spondylosis  Failed Back Surgical Syndrome  Chronic lower extremity pain (Right)  Chronic neck pain (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)  Chronic cervical radicular pain (Right)  Ulnar Neuropathy of Left Upper Extremity  Hx of Cervical Spine Surgery  Cervical Spondylosis  Fibromyalgia  Cervical spinal cord compression (HCC) (April, 2017)   Last Assessment & Plan:  Stable with ongoing neck and arm pain. Pain medication was renewed.   Cervical Spinal Stenosis   Last Assessment & Plan:  Stable with ongoing pain and limitation of motion.   Long Term Current Use of Opiate Analgesic  Long Term Prescription Opiate Use  Opiate use (60 MME/Day)   Analgesic: Oxycodone IR 10 mg every 4 hours (60 mg/day of oxycodone). 04/04/2016: We have already brought down her oxycodone IR 10 mg to cue 6 hours (40 mg/day of oxycodone).   Acute Postoperative Pain  Nausea  Vasovagal Episode  Encounter for Screening Colonoscopy  Rectal Bleeding  Morbid Obesity With Bmi of 40.0-44.9, Adult (Hcc)  Seasonal Allergic Rhinitis  Status Post Total Replacement of Right Shoulder  Encounter for Therapeutic Drug Level Monitoring  Encounter for Pain Management Planning  Groin Pain  Mucositis Oral  Chronic Obstructive Pulmonary Disease (Hcc)   Last Assessment & Plan:  Symptoms are improved this month. Continue efforts to reduce smoking.   Current Tobacco Use  Mixed Simple and Mucopurulent Chronic Bronchitis (Hcc)   Last Assessment & Plan:  Symptoms are improved this month. Continue efforts to reduce  smoking.   Adiposity  B12 Deficiency   Last Assessment & Plan:  She was given her B12 injection 1,000 mcg intramuscularly.   Bp (High Blood Pressure)   Last Assessment & Plan:  Hypertension well controlled on treatment. Evaluate laboratory status with regard to electrolytes and renal function.   Hypertension   Last Assessment & Plan:  Hypertension well controlled on  treatment. Evaluate laboratory status with regard to electrolytes and renal function.   Clinical Depression   Last Assessment & Plan:  Stable on treatment.   Adult Hypothyroidism   Last Assessment & Plan:  She appears clinically euthyroid. Evaluate thyroid function tests and continue replacement with levothyroxine.   Depression   Last Assessment & Plan:  Stable on treatment.   Hypothyroid   Last Assessment & Plan:  She appears clinically euthyroid. Evaluate thyroid function tests and continue replacement with levothyroxine.    Plan of Care  Pharmacotherapy (Medications Ordered): Meds ordered this encounter  Medications  . cyclobenzaprine (FLEXERIL) 10 MG tablet    Sig: Take 1 tablet (10 mg total) by mouth 3 (three) times daily as needed for muscle spasms.    Dispense:  90 tablet    Refill:  2    Do not place medication on "Automatic Refill". Fill one day early if pharmacy is closed on scheduled refill date.    Order Specific Question:   Supervising Provider    Answer:   Milinda Pointer [phone]  . Oxycodone HCl 10 MG TABS    Sig: Take 1 tablet (10 mg total) by mouth every 6 (six) hours as needed.    Dispense:  120 tablet    Refill:  0    Do not add this medication to the electronic "Automatic Refill" notification system. Patient may have prescription filled one day early if pharmacy is closed on scheduled refill date. Do not fill until: 03/30/2017 To last until:04/29/2017    Order Specific Question:   Supervising Provider    Answer:   Milinda Pointer [phone]  . Oxycodone HCl 10 MG TABS    Sig: Take 1 tablet (10 mg total) by mouth every 6 (six) hours as needed.    Dispense:  120 tablet    Refill:  0    Do not add this medication to the electronic "Automatic Refill" notification system. Patient may have prescription filled one day early if pharmacy is closed on scheduled refill date. Do not fill until: 01/29/2017 To last until: 02/28/2017    Order Specific Question:    Supervising Provider    Answer:   Milinda Pointer [phone]  . Oxycodone HCl 10 MG TABS    Sig: Take 1 tablet (10 mg total) by mouth every 6 (six) hours as needed.    Dispense:  120 tablet    Refill:  0    Do not add this medication to the electronic "Automatic Refill" notification system. Patient may have prescription filled one day early if pharmacy is closed on scheduled refill date. Do not fill until: 02/28/2017 To last until: 03/30/2017    Order Specific Question:   Supervising Provider    Answer:   Milinda Pointer [phone]  . diclofenac sodium (VOLTAREN) 1 % GEL    Sig: Apply 2 g topically 4 (four) times daily.    Dispense:  100 g    Refill:  2    Order Specific Question:   Supervising Provider    AnswerMilinda Pointer [phone]   New Prescriptions   No medications  on file   Medications administered today: Ms. Donze had no medications administered during this visit. Lab-work, procedure(s), and/or referral(s): Orders Placed This Encounter  Procedures  . ToxASSURE Select 13 (MW), Urine  Will consider get a magnesium if patient continues to have muscle spasms.  Imaging and/or referral(s): None  Interventional therapies: Planned, scheduled, and/or pending:   Not at this time    Considering:   Palliative bilateralsuprascapular nerve block Possible bilateral suprascapular nerve radiofrequencyablation Diagnostic bilateral intra-articular hip joint injection Possible bilateral hip joint radiofrequencyablation Diagnostic bilateral lumbar facet block Possible bilaterallumbar facet radiofrequencyablation Diagnostic caudal epiduralsteroid injection + diagnostic epidurogram Possible Racz procedure. Diagnostic bilateral cervical facet block Possible bilateral cervical facet radiofrequencyablation Diagnostic right-sided cervical epidural steroid injection Diagnostic bilateral sacroiliac joint block Possible bilateral sacroiliac joint RFA   Palliative PRN  treatment(s):   Palliative bilateralsuprascapular nerve block Diagnostic bilateral intra-articular hip joint injection Diagnostic bilateral lumbar facet block Diagnostic caudal epiduralsteroid injection + diagnostic epidurogram Diagnostic bilateral cervical facet block Diagnostic right-sided cervical epidural steroid injection Diagnostic bilateral sacroiliac joint block   Provider-requested follow-up: Return in about 3 months (around 04/25/2017) for MedMgmt.  Future Appointments Date Time Provider [place]  04/15/2017 10:15 AM Milinda Pointer, MD [facility] None  04/24/2017 2:30 PM Vevelyn Francois, NP [facility] None   Primary Care Physician: Marygrace Drought, MD Location: [facility] Note by: Vevelyn Francois NP Date: 01/23/2017; Time: 3:09 PM  Pain Score Disclaimer: We use the NRS-11 scale. This is a self-reported, subjective measurement of pain severity with only modest accuracy. It is used primarily to identify changes within a particular patient. It must be understood that outpatient pain scales are significantly less accurate that those used for research, where they can be applied under ideal controlled circumstances with minimal exposure to variables. In reality, the score is likely to be a combination of pain intensity and pain affect, where pain affect describes the degree of emotional arousal or changes in action readiness caused by the sensory experience of pain. Factors such as social and work situation, setting, emotional state, anxiety levels, expectation, and prior pain experience may influence pain perception and show large inter-individual differences that may also be affected by time variables.  Patient instructions provided during this appointment: Patient Instructions   ____________________________________________________________________________________________  Medication Rules  Applies to: All patients receiving prescriptions  (written or electronic).  Pharmacy of record: Pharmacy where electronic prescriptions will be sent. If written prescriptions are taken to a different pharmacy, please inform the nursing staff. The pharmacy listed in the electronic medical record should be the one where you would like electronic prescriptions to be sent.  Prescription refills: Only during scheduled appointments. Applies to both, written and electronic prescriptions.  NOTE: The following applies primarily to controlled substances (Opioid* Pain Medications).   Patient's responsibilities: 1. Pain Pills: Bring all pain pills to every appointment (except for procedure appointments). 2. Pill Bottles: Bring pills in original pharmacy bottle. Always bring newest bottle. Bring bottle, even if empty. 3. Medication refills: You are responsible for knowing and keeping track of what medications you need refilled. The day before your appointment, write a list of all prescriptions that need to be refilled. Bring that list to your appointment and give it to the admitting nurse. Prescriptions will be written only during appointments. If you forget a medication, it will not be "Called in", "Faxed", or "electronically sent". You will need to get another appointment to get these prescribed. 4. Prescription Accuracy: You are responsible  for carefully inspecting your prescriptions before leaving our office. Have the discharge nurse carefully go over each prescription with you, before taking them home. Make sure that your name is accurately spelled, that your address is correct. Check the name and dose of your medication to make sure it is accurate. Check the number of pills, and the written instructions to make sure they are clear and accurate. Make sure that you are given enough medication to last until your next medication refill appointment. 5. Taking Medication: Take medication as prescribed. Never take more pills than instructed. Never take medication  more frequently than prescribed. Taking less pills or less frequently is permitted and encouraged, when it comes to controlled substances (written prescriptions).  6. Inform other Doctors: Always inform, all of your healthcare providers, of all the medications you take. 7. Pain Medication from other Providers: You are not allowed to accept any additional pain medication from any other Doctor or Healthcare provider. There are two exceptions to this rule. (see below) In the event that you require additional pain medication, you are responsible for notifying us, as stated below. 8. Medication Agreement: You are responsible for carefully reading and following our Medication Agreement. This must be signed before receiving any prescriptions from our practice. Safely store a copy of your signed Agreement. Violations to the Agreement will result in no further prescriptions. (Additional copies of our Medication Agreement are available upon request.) 9. Laws, Rules, & Regulations: All patients are expected to follow all [address] and [employer], Rules, [place] [employer]. Ignorance of the Laws does not constitute a valid excuse. The use of any illegal substances is prohibited. 10. Adopted CDC guidelines & recommendations: Target dosing levels will be at or below 60 MME/day. Use of benzodiazepines** is not recommended.  Exceptions: There are only two exceptions to the rule of not receiving pain medications from other Healthcare Providers. 1. Exception #1 (Emergencies): In the event of an emergency (i.e.: accident requiring emergency care), you are allowed to receive additional pain medication. However, you are responsible for: As soon as you are able, call our office [phone], at any time of the day or night, and leave a message stating your name, the date and nature of the emergency, and the name and dose of the medication prescribed. In the event that your call is answered by a member of our staff, make sure  to document and save the date, time, and the name of the person that took your information.  2. Exception #2 (Planned Surgery): In the event that you are scheduled by another doctor or dentist to have any type of surgery or procedure, you are allowed (for a period no longer than 30 days), to receive additional pain medication, for the acute post-op pain. However, in this case, you are responsible for picking up a copy of our "Post-op Pain Management for Surgeons" handout, and giving it to your surgeon or dentist. This document is available at our office, and does not require an appointment to obtain it. Simply go to our office during business hours (Monday-Thursday from 8:00 AM to 4:00 PM) (Friday 8:00 AM to 12:00 Noon) or if you have a scheduled appointment with [place], prior to your surgery, and ask for it by name. In addition, you will need to provide [place] with your name, name of your surgeon, type of surgery, and date of procedure or surgery.  *Opioid medications include: morphine, codeine, oxycodone, oxymorphone, hydrocodone, hydromorphone, meperidine, tramadol, tapentadol, buprenorphine, fentanyl, methadone. **Benzodiazepine medications  include: diazepam (Valium), alprazolam (Xanax), clonazepam (Klonopine), lorazepam (Ativan), clorazepate (Tranxene), chlordiazepoxide (Librium), estazolam (Prosom), oxazepam (Serax), temazepam (Restoril), triazolam (Halcion)  ____________________________________________________________________________________________

## 2017-01-31 LAB — TOXASSURE SELECT 13 (MW), URINE

## 2017-02-05 ENCOUNTER — Ambulatory Visit: Payer: Medicaid Other | Admitting: Anesthesiology

## 2017-02-05 ENCOUNTER — Ambulatory Visit
Admission: RE | Admit: 2017-02-05 | Discharge: 2017-02-05 | Disposition: A | Discharge status: Home / Self Care | Payer: Medicaid Other | Source: Ambulatory Visit | Attending: General Surgery | Admitting: General Surgery

## 2017-02-05 ENCOUNTER — Encounter: Payer: Self-pay | Admitting: *Deleted

## 2017-02-05 ENCOUNTER — Encounter
Admission: RE | Disposition: A | Discharge status: Home / Self Care | Payer: Self-pay | Source: Ambulatory Visit | Attending: General Surgery

## 2017-02-05 DIAGNOSIS — D124 Benign neoplasm of descending colon: Secondary | ICD-10-CM

## 2017-02-05 DIAGNOSIS — K625 Hemorrhage of anus and rectum: Secondary | ICD-10-CM | POA: Insufficient documentation

## 2017-02-05 DIAGNOSIS — G4700 Insomnia, unspecified: Secondary | ICD-10-CM | POA: Insufficient documentation

## 2017-02-05 DIAGNOSIS — E785 Hyperlipidemia, unspecified: Secondary | ICD-10-CM | POA: Diagnosis not present

## 2017-02-05 DIAGNOSIS — F329 Major depressive disorder, single episode, unspecified: Secondary | ICD-10-CM | POA: Diagnosis not present

## 2017-02-05 DIAGNOSIS — Z881 Allergy status to other antibiotic agents status: Secondary | ICD-10-CM | POA: Diagnosis not present

## 2017-02-05 DIAGNOSIS — Z91041 Radiographic dye allergy status: Secondary | ICD-10-CM | POA: Insufficient documentation

## 2017-02-05 DIAGNOSIS — Z885 Allergy status to narcotic agent status: Secondary | ICD-10-CM | POA: Diagnosis not present

## 2017-02-05 DIAGNOSIS — J449 Chronic obstructive pulmonary disease, unspecified: Secondary | ICD-10-CM | POA: Diagnosis not present

## 2017-02-05 DIAGNOSIS — Z888 Allergy status to other drugs, medicaments and biological substances status: Secondary | ICD-10-CM | POA: Insufficient documentation

## 2017-02-05 DIAGNOSIS — F17210 Nicotine dependence, cigarettes, uncomplicated: Secondary | ICD-10-CM | POA: Diagnosis not present

## 2017-02-05 DIAGNOSIS — D123 Benign neoplasm of transverse colon: Secondary | ICD-10-CM | POA: Diagnosis not present

## 2017-02-05 DIAGNOSIS — B1920 Unspecified viral hepatitis C without hepatic coma: Secondary | ICD-10-CM | POA: Diagnosis not present

## 2017-02-05 DIAGNOSIS — D120 Benign neoplasm of cecum: Secondary | ICD-10-CM | POA: Diagnosis not present

## 2017-02-05 DIAGNOSIS — F419 Anxiety disorder, unspecified: Secondary | ICD-10-CM | POA: Diagnosis not present

## 2017-02-05 DIAGNOSIS — M1990 Unspecified osteoarthritis, unspecified site: Secondary | ICD-10-CM | POA: Insufficient documentation

## 2017-02-05 DIAGNOSIS — K589 Irritable bowel syndrome without diarrhea: Secondary | ICD-10-CM | POA: Diagnosis not present

## 2017-02-05 DIAGNOSIS — R011 Cardiac murmur, unspecified: Secondary | ICD-10-CM | POA: Diagnosis not present

## 2017-02-05 DIAGNOSIS — Z79899 Other long term (current) drug therapy: Secondary | ICD-10-CM | POA: Insufficient documentation

## 2017-02-05 DIAGNOSIS — E039 Hypothyroidism, unspecified: Secondary | ICD-10-CM | POA: Diagnosis not present

## 2017-02-05 DIAGNOSIS — Z886 Allergy status to analgesic agent status: Secondary | ICD-10-CM | POA: Diagnosis not present

## 2017-02-05 DIAGNOSIS — I10 Essential (primary) hypertension: Secondary | ICD-10-CM | POA: Insufficient documentation

## 2017-02-05 DIAGNOSIS — M797 Fibromyalgia: Secondary | ICD-10-CM | POA: Diagnosis not present

## 2017-02-05 DIAGNOSIS — Z96611 Presence of right artificial shoulder joint: Secondary | ICD-10-CM | POA: Diagnosis not present

## 2017-02-05 DIAGNOSIS — Z96612 Presence of left artificial shoulder joint: Secondary | ICD-10-CM | POA: Diagnosis not present

## 2017-02-05 DIAGNOSIS — Z6841 Body Mass Index (BMI) 40.0 and over, adult: Secondary | ICD-10-CM | POA: Insufficient documentation

## 2017-02-05 HISTORY — PX: COLONOSCOPY WITH PROPOFOL: SHX5780

## 2017-02-05 SURGERY — COLONOSCOPY WITH PROPOFOL
Anesthesia: General

## 2017-02-05 MED ORDER — PROPOFOL 500 MG/50ML IV EMUL
INTRAVENOUS | Status: AC
  Filled 2017-02-05: qty 50

## 2017-02-05 MED ORDER — SODIUM CHLORIDE 0.9 % IV SOLN
INTRAVENOUS | Status: DC
  Administered 2017-02-05: 09:00:00 via INTRAVENOUS

## 2017-02-05 MED ORDER — PROPOFOL 500 MG/50ML IV EMUL
INTRAVENOUS | Status: DC | PRN
  Administered 2017-02-05: 200 ug/kg/min via INTRAVENOUS

## 2017-02-05 MED ORDER — PROPOFOL 10 MG/ML IV BOLUS
INTRAVENOUS | Status: DC | PRN
  Administered 2017-02-05: 80 mg via INTRAVENOUS
  Administered 2017-02-05: 20 mg via INTRAVENOUS

## 2017-02-05 MED ORDER — LIDOCAINE 2% (20 MG/ML) 5 ML SYRINGE
INTRAMUSCULAR | Status: DC | PRN
  Administered 2017-02-05: 50 mg via INTRAVENOUS

## 2017-02-05 NOTE — Anesthesia Post-op Follow-up Note (Cosign Needed)
Anesthesia QCDR form completed.        

## 2017-02-05 NOTE — Transfer of Care (Signed)
Immediate Anesthesia Transfer of Care Note  Patient: Danielle [name]  Procedure(s) Performed: Procedure(s): COLONOSCOPY WITH PROPOFOL (N/A)  Patient Location: Endoscopy Unit  Anesthesia Type:General  Level of Consciousness: awake  Airway & Oxygen Therapy: Patient connected to nasal cannula oxygen  Post-op Assessment: Post -op Vital signs reviewed and stable  Post vital signs: stable  Last Vitals:  Vitals:   02/05/17 0906 02/05/17 1020  BP: (!) 147/89 123/70  Pulse: 98 99  Resp: 16 18  Temp: 36.8 C (!) 35.6 C    Last Pain:  Vitals:   02/05/17 1020  TempSrc: Tympanic  PainSc:          Complications: No apparent anesthesia complications

## 2017-02-05 NOTE — Progress Notes (Signed)
  NO MED NOTE: This forensic urine drug screen (UDS) test was conducted using a state-of-the-art ultra high performance liquid chromatography and mass spectrometry system (UPLC/MS-MS), the most sophisticated and accurate method available. UPLC/MS-MS is 1,000 times more precise and accurate than standard gas chromatography and mass spectrometry (GC/MS). This system can analyze 26 drug categories and 180 drug compounds.  The findings of this UDT were reported as abnormal due to inconsistencies with expected results. An expected substance was not present in the sample. Expectations were based on the medication history provided by the patient at the time of sample collection. Results may suggest one of the following possibilities:  1). Possible opioid diversion. 2). Non-compliance with instructions on how to take the medication, including increase intake of medication early in the prescription refill, possibly running out towards the end of the prescribed period. 3). A scheduling issue where the patient was unable to come in before running out of medication. 4). PRN and/or low dose use of medication.  BNZO-UNLISTED NOTE: This forensic urine drug screen (UDS) test was conducted using a state-of-the-art ultra high performance liquid chromatography and mass spectrometry system (UPLC/MS-MS), the most sophisticated and accurate method available. UPLC/MS-MS is 1,000 times more precise and accurate than standard gas chromatography and mass spectrometry (GC/MS). This system can analyze 26 drug categories and 180 drug compounds.  An unreported benzodiazepine was detected in the sample. CDC reports have identified the combination of opioids and benzodiazepines (Valium, Ativan, Xanax, Librium, Tranxene, Klonopin, Dalmane, Halcion,Restoril, etc.) to be associated in a significant number of reported drug-to-drug interactions leading to accidental overdosing leading to respiratory failure and death.

## 2017-02-05 NOTE — Anesthesia Preprocedure Evaluation (Signed)
Anesthesia Evaluation  Patient identified by MRN, date of birth, ID band Patient awake    Reviewed: Allergy & Precautions, H&P , NPO status , Patient's Chart, lab work & pertinent test results, reviewed documented beta blocker date and time   History of Anesthesia Complications Negative for: history of anesthetic complications  Airway Mallampati: III  TM Distance: >3 FB Neck ROM: full    Dental  (+) Dental Advidsory Given   Pulmonary shortness of breath and with exertion, asthma , COPD,  COPD inhaler, neg recent URI, Current Smoker,           Cardiovascular Exercise Tolerance: Good hypertension, (-) angina(-) CAD, (-) Past MI, (-) Cardiac Stents and (-) CABG (-) dysrhythmias + Valvular Problems/Murmurs      Neuro/Psych neg Seizures PSYCHIATRIC DISORDERS (Depression and fibromyalgia)  Neuromuscular disease    GI/Hepatic hiatal hernia, (+) Hepatitis -, C  Endo/Other  neg diabetesHypothyroidism Morbid obesity  Renal/GU negative Renal ROS  negative genitourinary   Musculoskeletal   Abdominal   Peds  Hematology negative hematology ROS (+)   Anesthesia Other Findings Past Medical History: No date: Anxiety No date: Arthritis No date: Asthma No date: Bell's palsy No date: Bursitis No date: COPD (chronic obstructive pulmonary disease) (HCC) No date: Depression No date: Fibromyalgia No date: Heart murmur No date: Hepatitis C No date: Hiatal hernia No date: Hyperlipidemia No date: Hypertension No date: IBS (irritable bowel syndrome) No date: Insomnia No date: Thyroid disease   Reproductive/Obstetrics negative OB ROS                             Anesthesia Physical Anesthesia Plan  ASA: III  Anesthesia Plan: General   Post-op Pain Management:    Induction: Intravenous  PONV Risk Score and Plan: 2 and Propofol  Airway Management Planned: Natural Airway and Nasal Cannula  Additional  Equipment:   Intra-op Plan:   Post-operative Plan:   Informed Consent: I have reviewed the patients History and Physical, chart, labs and discussed the procedure including the risks, benefits and alternatives for the proposed anesthesia with the patient or authorized representative who has indicated his/her understanding and acceptance.   Dental Advisory Given  Plan Discussed with: Anesthesiologist, CRNA and Surgeon  Anesthesia Plan Comments:         Anesthesia Quick Evaluation

## 2017-02-05 NOTE — Op Note (Signed)
[facility] Patient Name: Danielle [name] Procedure Date: 02/05/2017 9:40 AM MRN: [id number] Account #: [id number] Date of Birth: 12/26/1960 Admit Type: Outpatient Age: 56 Room: [facility] ENDO ROOM 1 Gender: Female Note Status: Finalized Procedure:            Colonoscopy Indications:          Rectal bleeding Providers:            Robert Bellow, MD Medicines:            Monitored Anesthesia Care Complications:        No immediate complications. Procedure:            Pre-Anesthesia Assessment:                       - Prior to the procedure, a History and Physical was                        performed, and patient medications, allergies and                        sensitivities were reviewed. The patient's tolerance of                        previous anesthesia was reviewed.                       - The risks and benefits of the procedure and the                        sedation options and risks were discussed with the                        patient. All questions were answered and informed                        consent was obtained.                       After obtaining informed consent, the colonoscope was                        passed under direct vision. Throughout the procedure,                        the patient's blood pressure, pulse, and oxygen                        saturations were monitored continuously. The                        Colonoscope was introduced through the anus and                        advanced to the the cecum, identified by appendiceal                        orifice and ileocecal valve. The colonoscopy was                        performed without difficulty. The patient tolerated the  procedure well. The quality of the bowel preparation                        was excellent. Findings:      Two sessile polyps were found in the descending colon. The polyps were 5       to 10 mm in size. The larger polyp  was removed with a hot snare. The       smaller polyp with a cold snare. Resection and retrieval were complete.      Two sessile polyps were found in the hepatic flexure. The polyps were 5       to 10 mm in size. The larger polyp was removed with a hot snare. The       smaller polyp with a cold snare. Resection and retrieval were complete.      The retroflexed view of the distal rectum and anal verge was normal and       showed no anal or rectal abnormalities. Impression:           - Two 5 to 10 mm polyps in the descending colon,                        removed with a hot snare. Resected and retrieved.                       - Two 5 to 10 mm polyps at the hepatic flexure, removed                        with a hot snare. Resected and retrieved.                       - The distal rectum and anal verge are normal on                        retroflexion view. Recommendation:       - Telephone endoscopist for pathology results in 1 week. Procedure Code(s):    --- Professional ---                       [phone], Colonoscopy, flexible; with removal of tumor(s),                        polyp(s), or other lesion(s) by snare technique Diagnosis Code(s):    --- Professional ---                       D12.4, Benign neoplasm of descending colon                       D12.3, Benign neoplasm of transverse colon (hepatic                        flexure or splenic flexure)                       K62.5, Hemorrhage of anus and rectum CPT copyright 2016 American Medical Association. All rights reserved. The codes documented in this report are preliminary and upon coder review may  be revised to meet current compliance requirements. Robert Bellow, MD 02/05/2017 10:25:29 AM This report has been signed electronically. Number of Addenda:  0 Note Initiated On: 02/05/2017 9:40 AM Scope Withdrawal Time: 0 hours 13 minutes 2 seconds  Total Procedure Duration: 0 hours 32 minutes 25 seconds       [facility]

## 2017-02-05 NOTE — H&P (Signed)
Danielle [name] [id number] Aug 21, 1960     HPI:  56 y/o woman with occasional rectal bleeding and constipation. For screening colonoscopy. Tolerated prep well.   Prescriptions Prior to Admission  Medication Sig Dispense Refill Last Dose  . albuterol (PROVENTIL HFA;VENTOLIN HFA) 108 (90 Base) MCG/ACT inhaler Inhale 2 puffs into the lungs every 4 (four) hours as needed for wheezing or shortness of breath.    Past Week at Unknown time  . amoxicillin (AMOXIL) 500 MG capsule Take 500 mg by mouth once. Patient took 4 caps per MD orders before procedure   02/05/2017 at Unknown time  . cetirizine (ZYRTEC) 10 MG tablet Take 10 mg by mouth daily.    02/04/2017 at Unknown time  . Cholecalciferol (VITAMIN D3) 2000 units capsule Take 2,000 Units by mouth daily.    02/04/2017 at Unknown time  . citalopram (CELEXA) 40 MG tablet Take 40 mg by mouth daily.  3 Past Month at Unknown time  . cyanocobalamin (,VITAMIN B-12,) 1000 MCG/ML injection Inject into the muscle.   Past Month at Unknown time  . cyclobenzaprine (FLEXERIL) 10 MG tablet Take 1 tablet (10 mg total) by mouth 3 (three) times daily as needed for muscle spasms. 90 tablet 2 Past Week at Unknown time  . docusate sodium (COLACE) 100 MG capsule Take 100 mg by mouth 2 (two) times daily.   Past Week at Unknown time  . levothyroxine (SYNTHROID, LEVOTHROID) 50 MCG tablet TAKE 1 TABLET (50 MCG TOTAL) BY MOUTH DAILY AT 0600.  11 02/04/2017 at Unknown time  . loratadine (CLARITIN) 10 MG tablet TAKE 1 TABLET (10 MG TOTAL) BY MOUTH DAILY.  11 02/04/2017 at Unknown time  . montelukast (SINGULAIR) 10 MG tablet TAKE 1 TABLET (10 MG TOTAL) BY MOUTH NIGHTLY.  3 02/04/2017 at Unknown time  . Oxycodone HCl 10 MG TABS Take 1 tablet (10 mg total) by mouth every 6 (six) hours as needed. 120 tablet 0 02/04/2017 at Unknown time  . SPIRIVA HANDIHALER 18 MCG inhalation capsule PLACE 1 CAPSULE (18 MCG TOTAL) INTO INHALER AND INHALE ONCE DAILY.  5 02/04/2017 at Unknown time  . diclofenac  sodium (VOLTAREN) 1 % GEL Apply 2 g topically 4 (four) times daily. 100 g 2   . fluconazole (DIFLUCAN) 100 MG tablet TAKE TWO ON THE FIRST DAY. THEN ONE DAILY FOR 14 DAYS.  0 Completed Course at Unknown time  . HYDROcodone-acetaminophen (NORCO/VICODIN) 5-325 MG tablet Take 1 tablet by mouth every 8 (eight) hours as needed for severe pain. 30 tablet 0 Taking  . magnesium hydroxide (MILK OF MAGNESIA) 400 MG/5ML suspension Take by mouth.   Taking  . naloxone [facility]) 2 MG/2ML injection Inject content of syringe into thigh muscle. Call 911. 2 Syringe 1 Taking  . nystatin (MYCOSTATIN/NYSTOP) powder Apply topically.   Taking  . [START ON 03/30/2017] Oxycodone HCl 10 MG TABS Take 1 tablet (10 mg total) by mouth every 6 (six) hours as needed. 120 tablet 0   . [START ON 02/28/2017] Oxycodone HCl 10 MG TABS Take 1 tablet (10 mg total) by mouth every 6 (six) hours as needed. 120 tablet 0   . polyethylene glycol powder (GLYCOLAX/MIRALAX) powder 255 grams one bottle for colonoscopy prep 255 g 0 Taking   Allergies  Allergen Reactions  . Aripiprazole Swelling  . Duloxetine Swelling    Other reaction(s): Other (See Comments) Clogged her up - constipation, feet swelling.  . Gabapentin Swelling    Feet swell.  Girtha Rm Hives and Itching  Itching 45 minutes after taking it & then broke out in hives. Was put on Prednisone. Treated for 5 months.  . Iodinated Diagnostic Agents Anaphylaxis    Other reaction(s): Other (See Comments) Had myelogram, given IV dye, had no heart beat, no pulse.  . Naproxen Sodium     Other reaction(s): Other (See Comments) GI bleeding.  . Nortriptyline Hcl Swelling    Other reaction(s): Other (See Comments) Made her hoarse, throat swelling when she took 100 mg.  . Nsaids     Other reaction(s): Other (See Comments) GI bleed.  . Pregabalin Swelling    Feet swelling.  . Ace Inhibitors     Other reaction(s): Other (See Comments) Unknown  . Aspirin     Other reaction(s): Other  (See Comments) Bothers her stomach.  . Cefpodoxime     Other reaction(s): Other (See Comments) Bad thrush.  . Fluoxetine Diarrhea    Constant runs.  . Fluticasone-Salmeterol Other (See Comments)    Blisters in mouth Other reaction(s): Other (See Comments) Blisters in mouth  . Levofloxacin     Other reaction(s): Other (See Comments) Unknown, probably stomach issue.  . Lithium     Other reaction(s): Other (See Comments) Made her stare at the walls & cry.  . Meperidine     Other reaction(s): Other (See Comments) Same as Demerol - made her break out in patches all over her body. Other reaction(s): Other (See Comments) Breaks out in patches all over her body.  . Paroxetine     Other reaction(s): Other (See Comments) Unknown  . Tape     Other reaction(s): Other (See Comments) Skin peels off  . Telithromycin     Other reaction(s): Other (See Comments) Blisters in mouth plus thrush.  . Theophylline     Other reaction(s): Other (See Comments) Unknown  . Topiramate Other (See Comments)    Vomiting and increased depression Other reaction(s): Other (See Comments) Vomiting and increased depression  . Trazodone     Other reaction(s): Other (See Comments) Blisters lips very bad.  . Triamcinolone     Other reaction(s): Other (See Comments) Mouth blistered down her throat even if she rinsed her mouth.  . Venlafaxine Diarrhea    Took for years & did fine. Dropped electrolytes because it caused a lot of diarrhea.  . Bupropion Rash  . Ketorolac Tromethamine Nausea And Vomiting    Other reaction(s): Other (See Comments) Puking & headache  . Morphine Nausea And Vomiting    Intermittent nausea & vomiting with pills, but could tolerate the liquid form.  . Moxifloxacin Nausea And Vomiting    Other reaction(s): Other (See Comments) Bad thrush.   Past Medical History:  Diagnosis Date  . Anxiety   . Arthritis   . Asthma   . Bell's palsy   . Bursitis   . COPD (chronic obstructive  pulmonary disease) ([place])   . Depression   . Fibromyalgia   . Heart murmur   . Hepatitis C   . Hiatal hernia   . Hyperlipidemia   . Hypertension   . IBS (irritable bowel syndrome)   . Insomnia   . Thyroid disease    Past Surgical History:  Procedure Laterality Date  . BACK SURGERY  2013   lumbar disk  . CARPAL TUNNEL RELEASE Right   . ELBOW SURGERY Left   . FOOT SURGERY Right   . necksurgery  10/21/2014   spine neck fusion  . NOSE SURGERY    . PARTIAL HYSTERECTOMY    .  TOTAL SHOULDER REPLACEMENT Bilateral    Social History   Social History  . Marital status: Divorced    Spouse name: N/A  . Number of children: N/A  . Years of education: N/A   Occupational History  . Not on file.   Social History Main Topics  . Smoking status: Current Every Day Smoker    Packs/day: 1.00    Years: 40.00    Types: Cigarettes  . Smokeless tobacco: Never Used  . Alcohol use No     Comment: rarely  . Drug use: No  . Sexual activity: Not on file   Other Topics Concern  . Not on file   Social History Narrative  . No narrative on file   Social History   Social History Narrative  . No narrative on file     ROS: Negative.     PE: HEENT: Negative. Lungs: Clear. Cardio: RR.  Assessment/Plan:  Proceed with planned endoscopy. Robert Bellow 02/05/2017

## 2017-02-05 NOTE — Anesthesia Postprocedure Evaluation (Signed)
Anesthesia Post Note  Patient: Danielle [name]  Procedure(s) Performed: Procedure(s) (LRB): COLONOSCOPY WITH PROPOFOL (N/A)  Patient location during evaluation: Endoscopy Anesthesia Type: General Level of consciousness: awake and alert Pain management: pain level controlled Vital Signs Assessment: post-procedure vital signs reviewed and stable Respiratory status: spontaneous breathing, nonlabored ventilation, respiratory function stable and patient connected to nasal cannula oxygen Cardiovascular status: blood pressure returned to baseline and stable Postop Assessment: no signs of nausea or vomiting Anesthetic complications: no     Last Vitals:  Vitals:   02/05/17 1050 02/05/17 1057  BP: 140/82 123/86  Pulse: 81 78  Resp: 17 18  Temp:      Last Pain:  Vitals:   02/05/17 1020  TempSrc: Tympanic  PainSc:                  [name]

## 2017-02-06 ENCOUNTER — Encounter: Payer: Self-pay | Admitting: General Surgery

## 2017-02-06 ENCOUNTER — Telehealth: Payer: Self-pay

## 2017-02-06 LAB — SURGICAL PATHOLOGY

## 2017-02-06 NOTE — Telephone Encounter (Signed)
-----   Message from Robert Bellow, MD sent at 02/06/2017  2:59 PM EDT ----- Please notify the patient all polyps fine.  Would recommend a follow up exam in five years. Thanks.  ----- Message ----- From: Interface, [facility] Sent: 02/06/2017  11:20 AM To: Robert Bellow, MD

## 2017-02-07 NOTE — Telephone Encounter (Signed)
Notified patient as instructed, patient pleased. Discussed follow-up appointments, patient agrees. Patient placed in recalls.   

## 2017-02-10 ENCOUNTER — Encounter: Payer: Self-pay | Admitting: General Surgery

## 2017-03-27 ENCOUNTER — Telehealth: Payer: Self-pay | Admitting: Nurse Practitioner

## 2017-03-27 NOTE — Telephone Encounter (Signed)
Pharmacy notified ok to fill early due to weather conditions, asked then to please inform patient that the prescription still must last until 04-29-17.

## 2017-03-27 NOTE — Telephone Encounter (Signed)
Pharmacy wants to speak with someone regarding script fill date due to weather coming in [place]

## 2017-04-14 ENCOUNTER — Ambulatory Visit: Payer: Medicaid Other | Admitting: Pain Medicine

## 2017-04-15 ENCOUNTER — Ambulatory Visit
Admission: RE | Admit: 2017-04-15 | Discharge: 2017-04-15 | Disposition: A | Discharge status: Home / Self Care | Payer: Medicaid Other | Source: Ambulatory Visit | Attending: Pain Medicine | Admitting: Pain Medicine

## 2017-04-15 ENCOUNTER — Telehealth: Payer: Self-pay | Admitting: Pain Medicine

## 2017-04-15 ENCOUNTER — Encounter: Payer: Self-pay | Admitting: Pain Medicine

## 2017-04-15 ENCOUNTER — Ambulatory Visit (HOSPITAL_BASED_OUTPATIENT_CLINIC_OR_DEPARTMENT_OTHER): Payer: Medicaid Other | Admitting: Pain Medicine

## 2017-04-15 VITALS — BP 129/81 | HR 90 | Temp 96.8°F | Resp 17 | Ht 65.0 in | Wt 248.0 lb

## 2017-04-15 DIAGNOSIS — Z79891 Long term (current) use of opiate analgesic: Secondary | ICD-10-CM | POA: Diagnosis not present

## 2017-04-15 DIAGNOSIS — G8929 Other chronic pain: Secondary | ICD-10-CM | POA: Insufficient documentation

## 2017-04-15 DIAGNOSIS — M47816 Spondylosis without myelopathy or radiculopathy, lumbar region: Secondary | ICD-10-CM

## 2017-04-15 DIAGNOSIS — Z888 Allergy status to other drugs, medicaments and biological substances status: Secondary | ICD-10-CM | POA: Insufficient documentation

## 2017-04-15 DIAGNOSIS — M488X6 Other specified spondylopathies, lumbar region: Secondary | ICD-10-CM | POA: Diagnosis not present

## 2017-04-15 DIAGNOSIS — G8918 Other acute postprocedural pain: Secondary | ICD-10-CM | POA: Insufficient documentation

## 2017-04-15 DIAGNOSIS — Z91041 Radiographic dye allergy status: Secondary | ICD-10-CM | POA: Diagnosis not present

## 2017-04-15 DIAGNOSIS — Z885 Allergy status to narcotic agent status: Secondary | ICD-10-CM | POA: Diagnosis not present

## 2017-04-15 DIAGNOSIS — Z79899 Other long term (current) drug therapy: Secondary | ICD-10-CM | POA: Diagnosis not present

## 2017-04-15 DIAGNOSIS — Z96612 Presence of left artificial shoulder joint: Secondary | ICD-10-CM | POA: Diagnosis not present

## 2017-04-15 DIAGNOSIS — M545 Low back pain: Secondary | ICD-10-CM | POA: Insufficient documentation

## 2017-04-15 DIAGNOSIS — M5441 Lumbago with sciatica, right side: Secondary | ICD-10-CM

## 2017-04-15 DIAGNOSIS — Z96611 Presence of right artificial shoulder joint: Secondary | ICD-10-CM | POA: Insufficient documentation

## 2017-04-15 MED ORDER — ROPIVACAINE HCL 2 MG/ML IJ SOLN
9.0000 mL | Freq: Once | INTRAMUSCULAR | Status: AC
  Administered 2017-04-15: 10 mL via PERINEURAL
  Filled 2017-04-15: qty 10

## 2017-04-15 MED ORDER — TRIAMCINOLONE ACETONIDE 40 MG/ML IJ SUSP
40.0000 mg | Freq: Once | INTRAMUSCULAR | Status: AC
  Administered 2017-04-15: 40 mg

## 2017-04-15 MED ORDER — HYDROCODONE-ACETAMINOPHEN 5-325 MG PO TABS: 1.0000 | ORAL_TABLET | Freq: Three times a day (TID) | ORAL | 0 refills | Status: DC | PRN

## 2017-04-15 MED ORDER — LACTATED RINGERS IV SOLN: 1000.0000 mL | Freq: Once | INTRAVENOUS | Status: DC

## 2017-04-15 MED ORDER — MIDAZOLAM HCL 5 MG/5ML IJ SOLN
1.0000 mg | INTRAMUSCULAR | Status: DC | PRN
  Administered 2017-04-15: 4 mg via INTRAVENOUS
  Filled 2017-04-15: qty 5

## 2017-04-15 MED ORDER — FENTANYL CITRATE (PF) 100 MCG/2ML IJ SOLN
25.0000 ug | INTRAMUSCULAR | Status: DC | PRN
  Administered 2017-04-15: 75 ug via INTRAVENOUS
  Filled 2017-04-15: qty 2

## 2017-04-15 MED ORDER — TRIAMCINOLONE ACETONIDE 40 MG/ML IJ SUSP
INTRAMUSCULAR | Status: AC
  Filled 2017-04-15: qty 1

## 2017-04-15 MED ORDER — LIDOCAINE HCL 2 % IJ SOLN
10.0000 mL | Freq: Once | INTRAMUSCULAR | Status: AC
  Administered 2017-04-15: 400 mg
  Filled 2017-04-15: qty 100

## 2017-04-15 NOTE — Telephone Encounter (Signed)
PA sent for post RF medication. 04/15/17 LP

## 2017-04-15 NOTE — Telephone Encounter (Signed)
Patient called, she is unable to get meds filled, need prior auth for Medicaid, please call asap

## 2017-04-15 NOTE — Patient Instructions (Addendum)
____________________________________________________________________________________________  Post-Procedure instructions Instructions:  Apply ice: Fill a plastic sandwich bag with crushed ice. Cover it with a small towel and apply to injection site. Apply for 15 minutes then remove x 15 minutes. Repeat sequence on day of procedure, until you go to bed. The purpose is to minimize swelling and discomfort after procedure.  Apply heat: Apply heat to procedure site starting the day following the procedure. The purpose is to treat any soreness and discomfort from the procedure.  Food intake: Start with clear liquids (like water) and advance to regular food, as tolerated.   Physical activities: Keep activities to a minimum for the first 8 hours after the procedure.   Driving: If you have received any sedation, you are not allowed to drive for 24 hours after your procedure.  Blood thinner: Restart your blood thinner 6 hours after your procedure. (Only for those taking blood thinners)  Insulin: As soon as you can eat, you may resume your normal dosing schedule. (Only for those taking insulin)  Infection prevention: Keep procedure site clean and dry.  Post-procedure Pain Diary: Extremely important that this be done correctly and accurately. Recorded information will be used to determine the next step in treatment.  Pain evaluated is that of treated area only. Do not include pain from an untreated area.  Complete every hour, on the hour, for the initial 8 hours. Set an alarm to help you do this part accurately.  Do not go to sleep and have it completed later. It will not be accurate.  Follow-up appointment: Keep your follow-up appointment after the procedure. Usually 2 weeks for most procedures. (6 weeks in the case of radiofrequency.) Bring you pain diary.  Expect:  From numbing medicine (AKA: Local Anesthetics): Numbness or decrease in pain.  Onset: Full effect within 15 minutes of  injected.  Duration: It will depend on the type of local anesthetic used. On the average, 1 to 8 hours.   From steroids: Decrease in swelling or inflammation. Once inflammation is improved, relief of the pain will follow.  Onset of benefits: Depends on the amount of swelling present. The more swelling, the longer it will take for the benefits to be seen. In some cases, up to 10 days.  Duration: Steroids will stay in the system x 2 weeks. Duration of benefits will depend on multiple posibilities including persistent irritating factors.  From procedure: Some discomfort is to be expected once the numbing medicine wears off. This should be minimal if ice and heat are applied as instructed. Call if:  You experience numbness and weakness that gets worse with time, as opposed to wearing off.  New onset bowel or bladder incontinence. (Spinal procedures only)  Emergency Numbers:  Durning business hours (Monday - Thursday, 8:00 AM - 4:00 PM) (Friday, 9:00 AM - 12:00 Noon): [phone]  After hours: [phone] ____________________________________________________________________________________________  Radiofrequency Lesioning Radiofrequency lesioning is a procedure that is performed to relieve pain. The procedure is often used for back, neck, or arm pain. Radiofrequency lesioning involves the use of a machine that creates radio waves to make heat. During the procedure, the heat is applied to the nerve that carries the pain signal. The heat damages the nerve and interferes with the pain signal. Pain relief usually starts about 2 weeks after the procedure and lasts for 6 months to 1 year. Tell a health care provider about:  Any allergies you have.  All medicines you are taking, including vitamins, herbs, eye drops, creams, and  over-the-counter medicines.  Any problems you or family members have had with anesthetic medicines.  Any blood disorders you have.  Any surgeries you have  had.  Any medical conditions you have.  Whether you are pregnant or may be pregnant. What are the risks? Generally, this is a safe procedure. However, problems may occur, including:  Pain or soreness at the injection site.  Infection at the injection site.  Damage to nerves or blood vessels.  What happens before the procedure?  Ask your health care provider about: ? Changing or stopping your regular medicines. This is especially important if you are taking diabetes medicines or blood thinners. ? Taking medicines such as aspirin and ibuprofen. These medicines can thin your blood. Do not take these medicines before your procedure if your health care provider instructs you not to.  Follow instructions from your health care provider about eating or drinking restrictions.  Plan to have someone take you home after the procedure.  If you go home right after the procedure, plan to have someone with you for 24 hours. What happens during the procedure?  You will be given one or more of the following: ? A medicine to help you relax (sedative). ? A medicine to numb the area (local anesthetic).  You will be awake during the procedure. You will need to be able to talk with the health care provider during the procedure.  With the help of a type of X-ray (fluoroscopy), the health care provider will insert a radiofrequency needle into the area to be treated.  Next, a wire that carries the radio waves (electrode) will be put through the radiofrequency needle. An electrical pulse will be sent through the electrode to verify the correct nerve. You will feel a tingling sensation, and you may have muscle twitching.  Then, the tissue that is around the needle tip will be heated by an electric current that is passed using the radiofrequency machine. This will numb the nerves.  A bandage (dressing) will be put on the insertion area after the procedure is done. The procedure may vary among health care  providers and hospitals. What happens after the procedure?  Your blood pressure, heart rate, breathing rate, and blood oxygen level will be monitored often until the medicines you were given have worn off.  Return to your normal activities as directed by your health care provider. This information is not intended to replace advice given to you by your health care provider. Make sure you discuss any questions you have with your health care provider. Document Released: 02/27/2011 Document Revised: 12/07/2015 Document Reviewed: 08/08/2014 Elsevier Interactive Patient Education  2018 [place]. Pain Management Discharge Instructions  General Discharge Instructions :  If you need to reach your doctor call: Monday-Friday 8:00 am - 4:00 pm at [phone] or toll free [phone].  After clinic hours [phone] to have operator reach doctor.  Bring all of your medication bottles to all your appointments in the pain clinic.  To cancel or reschedule your appointment with Pain Management please remember to call 24 hours in advance to avoid a fee.  Refer to the educational materials which you have been given on: General Risks, I had my Procedure. Discharge Instructions, Post Sedation.  Post Procedure Instructions:  The drugs you were given will stay in your system until tomorrow, so for the next 24 hours you should not drive, make any legal decisions or drink any alcoholic beverages.  You may eat anything you prefer, but it is better  to start with liquids then soups and crackers, and gradually work up to solid foods.  Please notify your doctor immediately if you have any unusual bleeding, trouble breathing or pain that is not related to your normal pain.  Depending on the type of procedure that was done, some parts of your body may feel week and/or numb.  This usually clears up by tonight or the next day.  Walk with the use of an assistive device or accompanied by an adult for the 24  hours.  You may use ice on the affected area for the first 24 hours.  Put ice in a Ziploc bag and cover with a towel and place against area 15 minutes on 15 minutes off.  You may switch to heat after 24 hours.Radiofrequency Lesioning Radiofrequency lesioning is a procedure that is performed to relieve pain. The procedure is often used for back, neck, or arm pain. Radiofrequency lesioning involves the use of a machine that creates radio waves to make heat. During the procedure, the heat is applied to the nerve that carries the pain signal. The heat damages the nerve and interferes with the pain signal. Pain relief usually starts about 2 weeks after the procedure and lasts for 6 months to 1 year. Tell a health care provider about:  Any allergies you have.  All medicines you are taking, including vitamins, herbs, eye drops, creams, and over-the-counter medicines.  Any problems you or family members have had with anesthetic medicines.  Any blood disorders you have.  Any surgeries you have had.  Any medical conditions you have.  Whether you are pregnant or may be pregnant. What are the risks? Generally, this is a safe procedure. However, problems may occur, including:  Pain or soreness at the injection site.  Infection at the injection site.  Damage to nerves or blood vessels.  What happens before the procedure?  Ask your health care provider about: ? Changing or stopping your regular medicines. This is especially important if you are taking diabetes medicines or blood thinners. ? Taking medicines such as aspirin and ibuprofen. These medicines can thin your blood. Do not take these medicines before your procedure if your health care provider instructs you not to.  Follow instructions from your health care provider about eating or drinking restrictions.  Plan to have someone take you home after the procedure.  If you go home right after the procedure, plan to have someone with you for  24 hours. What happens during the procedure?  You will be given one or more of the following: ? A medicine to help you relax (sedative). ? A medicine to numb the area (local anesthetic).  You will be awake during the procedure. You will need to be able to talk with the health care provider during the procedure.  With the help of a type of X-ray (fluoroscopy), the health care provider will insert a radiofrequency needle into the area to be treated.  Next, a wire that carries the radio waves (electrode) will be put through the radiofrequency needle. An electrical pulse will be sent through the electrode to verify the correct nerve. You will feel a tingling sensation, and you may have muscle twitching.  Then, the tissue that is around the needle tip will be heated by an electric current that is passed using the radiofrequency machine. This will numb the nerves.  A bandage (dressing) will be put on the insertion area after the procedure is done. The procedure may vary among health  care providers and hospitals. What happens after the procedure?  Your blood pressure, heart rate, breathing rate, and blood oxygen level will be monitored often until the medicines you were given have worn off.  Return to your normal activities as directed by your health care provider. This information is not intended to replace advice given to you by your health care provider. Make sure you discuss any questions you have with your health care provider. Document Released: 02/27/2011 Document Revised: 12/07/2015 Document Reviewed: 08/08/2014 Elsevier Interactive Patient Education  [employer].

## 2017-04-15 NOTE — Progress Notes (Signed)
Patient's Name: Danielle [name]  MRN: [id number]  Referring Provider: Milinda Pointer, MD  DOB: 09-01-1960  PCP: Marygrace Drought, MD  DOS: 04/15/2017  Note by: Gaspar Cola, MD  Service setting: Ambulatory outpatient  Specialty: Interventional Pain Management  Patient type: Established  Location: [facility] (AMB) Pain Management Facility  Visit type: Interventional Procedure   Primary Reason for Visit: Interventional Pain Management Treatment. CC: Back Pain (lower left)  Procedure:  Anesthesia, Analgesia, Anxiolysis:  Type: Therapeutic Medial Branch Facet Radiofrequency Ablation Region: Lumbar Level: L2, L3, L4, L5, & S1 Medial Branch Level(s) Laterality: Left-Sided  Type: Local Anesthesia with Moderate (Conscious) Sedation Local Anesthetic: Lidocaine 1% Route: Intravenous (IV) IV Access: Secured Sedation: Meaningful verbal contact was maintained at all times during the procedure  Indication(s): Analgesia and Anxiety   Indications: 1. Lumbar facet syndrome (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)   2. Lumbar spondylosis   3. Chronic low back pain (Location of Tertiary source of pain) (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)   4. Acute postoperative pain    Danielle [name] has either failed to respond, was unable to tolerate, or simply did not get enough benefit from other more conservative therapies including, but not limited to: 1. Over-the-counter medications 2. Anti-inflammatory medications 3. Muscle relaxants 4. Membrane stabilizers 5. Opioids 6. Physical therapy 7. Modalities (Heat, ice, etc.) 8. Invasive techniques such as nerve blocks. Ms. Lahm has attained more than 50% relief of the pain from a series of diagnostic injections conducted in separate occasions.  Pain Score: Pre-procedure: 4 /10 Post-procedure: 5 /10  Pre-op Assessment:  Danielle [name] is a 56 y.o. (year old), female patient, seen today for interventional treatment. She  has a past surgical history that includes necksurgery (10/21/2014); Back  surgery (2013); Foot surgery (Right); Elbow surgery (Left); Carpal tunnel release (Right); Nose surgery; Partial hysterectomy; Total shoulder replacement (Bilateral); and Colonoscopy with propofol (N/A, 02/05/2017). Danielle [name] has a current medication list which includes the following prescription(s): albuterol, citalopram, cyanocobalamin, cyclobenzaprine, diclofenac sodium, docusate sodium, levothyroxine, magnesium hydroxide, naloxone, nystatin, oxycodone hcl, spiriva handihaler, biotin, cetirizine, vitamin d3, fluconazole, hydrocodone-acetaminophen, loratadine, montelukast, oxycodone hcl, and oxycodone hcl, and the following Facility-Administered Medications: fentanyl, lactated ringers, and midazolam. Her primarily concern today is the Back Pain (lower left)  Initial Vital Signs: There were no vitals taken for this visit. BMI: Estimated body mass index is 41.27 kg/m as calculated from the following:   Height as of this encounter: 5\' 5"  (1.651 m).   Weight as of this encounter: 248 lb (112.5 kg).  Risk Assessment: Allergies: Reviewed. She is allergic to aripiprazole; duloxetine; gabapentin; gold; iodinated diagnostic agents; naproxen sodium; nortriptyline hcl; nsaids; pregabalin; ace inhibitors; aspirin; cefpodoxime; fluoxetine; fluticasone-salmeterol; levofloxacin; lithium; meperidine; paroxetine; tape; telithromycin; theophylline; topiramate; trazodone; triamcinolone; venlafaxine; bupropion; ketorolac tromethamine; morphine; and moxifloxacin.  Allergy Precautions: None required Coagulopathies: Reviewed. None identified.  Blood-thinner therapy: None at this time Active Infection(s): Reviewed. None identified. Danielle [name] is afebrile  Site Confirmation: Danielle [name] was asked to confirm the procedure and laterality before marking the site Procedure checklist: Completed Consent: Before the procedure and under the influence of no sedative(s), amnesic(s), or anxiolytics, the patient was informed of the  treatment options, risks and possible complications. To fulfill our ethical and legal obligations, as recommended by the American Medical Association's Code of Ethics, I have informed the patient of my clinical impression; the nature and purpose of the treatment or procedure; the risks, benefits, and possible complications of the intervention; the alternatives, including doing nothing; the risk(s) and benefit(s) of the  alternative treatment(s) or procedure(s); and the risk(s) and benefit(s) of doing nothing. The patient was provided information about the general risks and possible complications associated with the procedure. These may include, but are not limited to: failure to achieve desired goals, infection, bleeding, organ or nerve damage, allergic reactions, paralysis, and death. In addition, the patient was informed of those risks and complications associated to Spine-related procedures, such as failure to decrease pain; infection (i.e.: Meningitis, epidural or intraspinal abscess); bleeding (i.e.: epidural hematoma, subarachnoid hemorrhage, or any other type of intraspinal or peri-dural bleeding); organ or nerve damage (i.e.: Any type of peripheral nerve, nerve root, or spinal cord injury) with subsequent damage to sensory, motor, and/or autonomic systems, resulting in permanent pain, numbness, and/or weakness of one or several areas of the body; allergic reactions; (i.e.: anaphylactic reaction); and/or death. Furthermore, the patient was informed of those risks and complications associated with the medications. These include, but are not limited to: allergic reactions (i.e.: anaphylactic or anaphylactoid reaction(s)); adrenal axis suppression; blood sugar elevation that in diabetics may result in ketoacidosis or comma; water retention that in patients with history of congestive heart failure may result in shortness of breath, pulmonary edema, and decompensation with resultant heart failure; weight gain;  swelling or edema; medication-induced neural toxicity; particulate matter embolism and blood vessel occlusion with resultant organ, and/or nervous system infarction; and/or aseptic necrosis of one or more joints. Finally, the patient was informed that Medicine is not an exact science; therefore, there is also the possibility of unforeseen or unpredictable risks and/or possible complications that may result in a catastrophic outcome. The patient indicated having understood very clearly. We have given the patient no guarantees and we have made no promises. Enough time was given to the patient to ask questions, all of which were answered to the patient's satisfaction. Ms. Digangi has indicated that she wanted to continue with the procedure. Attestation: I, the ordering provider, attest that I have discussed with the patient the benefits, risks, side-effects, alternatives, likelihood of achieving goals, and potential problems during recovery for the procedure that I have provided informed consent. Date: 04/15/2017; Time: 7:46 AM  Pre-Procedure Preparation:  Monitoring: As per clinic protocol. Respiration, ETCO2, SpO2, BP, heart rate and rhythm monitor placed and checked for adequate function Safety Precautions: Patient was assessed for positional comfort and pressure points before starting the procedure. Time-out: I initiated and conducted the "Time-out" before starting the procedure, as per protocol. The patient was asked to participate by confirming the accuracy of the "Time Out" information. Verification of the correct person, site, and procedure were performed and confirmed by me, the nursing staff, and the patient. "Time-out" conducted as per Joint Commission's Universal Protocol (UP.01.01.01). "Time-out" Date & Time: 04/15/2017; 1107 hrs.  Description of Procedure Process:   Position: Prone Target Area: For Lumbar Facet blocks, the target is the groove formed by the junction of the transverse process  and superior articular process. For the L5 dorsal ramus, the target is the notch between superior articular process and sacral ala. For the S1 dorsal ramus, the target is the superior and lateral edge of the posterior S1 Sacral foramen. Approach: Paraspinal approach. Area Prepped: Entire Posterior Lumbosacral Region Prepping solution: Hibiclens (4.0% Chlorhexidine gluconate solution) Safety Precautions: Aspiration looking for blood return was conducted prior to all injections. At no point did we inject any substances, as a needle was being advanced. No attempts were made at seeking any paresthesias. Safe injection practices and needle disposal techniques used. Medications properly  checked for expiration dates. SDV (single dose vial) medications used. Description of the Procedure: Protocol guidelines were followed. The patient was placed in position over the fluoroscopy table. The target area was identified and the area prepped in the usual manner. Skin desensitized using vapocoolant spray. Skin & deeper tissues infiltrated with local anesthetic. Appropriate amount of time allowed to pass for local anesthetics to take effect. Radiofrequency needles were introduced to the area of the medial branch at the junction of the superior articular process and transverse process using fluoroscopy. Using the [employer], sensory stimulation using 50 Hz was used to locate & identify the nerve, making sure that the needle was positioned such that there was no sensory stimulation below 0.3 V or above 0.7 V. Stimulation using 2 Hz was used to evaluate the motor component. Care was taken not to lesion any nerves that demonstrated motor stimulation of the lower extremities at an output of less than 2.5 times that of the sensory threshold, or a maximum of 2.0 V. Once satisfactory placement of the needles was achieved, the above solution was slowly injected after negative aspiration. After waiting for at least  2 minutes, the ablation was performed at 80 degrees C for 60 seconds.The needles were then removed and the area cleansed, making sure to leave some of the prepping solution back to take advantage of its long term bactericidal properties. Intra-operative Compliance: Compliant  Illustration of the posterior view of the lumbar spine and the posterior neural structures. Laminae of L2 through S1 are labeled. DPRL5, dorsal primary ramus of L5; DPRS1, dorsal primary ramus of S1; DPR3, dorsal primary ramus of L3; FJ, facet (zygapophyseal) joint L3-L4; I, inferior articular process of L4; LB1, lateral branch of dorsal primary ramus of L1; IAB, inferior articular branches from L3 medial branch (supplies L4-L5 facet joint); IBP, intermediate branch plexus; MB3, medial branch of dorsal primary ramus of L3; NR3, third lumbar nerve root; S, superior articular process of L5; SAB, superior articular branches from L4 (supplies L4-5 facet joint also); TP3, transverse process of L3.  Vitals:   04/15/17 1200 04/15/17 1204 04/15/17 1214 04/15/17 1224  BP: (!) 134/97 (!) 127/92 113/75 129/81  Pulse:      Resp: 12 20 15 17   Temp:      TempSrc:      SpO2: 95% 97% 97% 97%  Weight:      Height:        Start Time: 1107 hrs. End Time: 1145 hrs. Materials & Medications:  Needle(s) Type: Teflon-coated, curved tip, Radiofrequency needle(s) Gauge: 22G Length: 10cm Medication(s): We administered midazolam, fentaNYL, lidocaine, triamcinolone acetonide, and ropivacaine (PF) 2 mg/mL (0.2%). Please see chart orders for dosing details.  Imaging Guidance (Spinal):  Type of Imaging Technique: Fluoroscopy Guidance (Spinal) Indication(s): Assistance in needle guidance and placement for procedures requiring needle placement in or near specific anatomical locations not easily accessible without such assistance. Exposure Time: Please see nurses notes. Contrast: None used. Fluoroscopic Guidance: I was personally present during the  use of fluoroscopy. "Tunnel Vision Technique" used to obtain the best possible view of the target area. Parallax error corrected before commencing the procedure. "Direction-depth-direction" technique used to introduce the needle under continuous pulsed fluoroscopy. Once target was reached, antero-posterior, oblique, and lateral fluoroscopic projection used confirm needle placement in all planes. Images permanently stored in EMR. Interpretation: No contrast injected. I personally interpreted the imaging intraoperatively. Adequate needle placement confirmed in multiple planes. Permanent images saved into the patient's record.  Antibiotic  Prophylaxis:  Indication(s): None identified Antibiotic given: None  Post-operative Assessment:  EBL: None Complications: No immediate post-treatment complications observed by team, or reported by patient. Note: During the entire procedure, the patient kept moving and racing her head. Unfortunately the contraction of the paravertebral muscles Pulling her needles out of place making the procedure a lot more complicated than it had to be an requiring that we reposition the needles multiple times. Other than this, the patient did tolerate the procedure well. A repeat set of vitals were taken after the procedure and the patient was kept under observation following institutional policy, for this type of procedure. Post-procedural neurological assessment was performed, showing return to baseline, prior to discharge. The patient was provided with post-procedure discharge instructions, including a section on how to identify potential problems. Should any problems arise concerning this procedure, the patient was given instructions to immediately contact us, at any time, without hesitation. In any case, we plan to contact the patient by telephone for a follow-up status report regarding this interventional procedure. Comments:  No additional relevant information.  Plan of Care     Imaging Orders     DG C-Arm 1-60 Min-No Report  Procedure Orders     Radiofrequency,Lumbar  Medications ordered for procedure: Meds ordered this encounter  Medications  . lactated ringers infusion 1,000 mL  . midazolam (VERSED) 5 MG/5ML injection 1-2 mg    Make sure Flumazenil is available in the pyxis when using this medication. If oversedation occurs, administer 0.2 mg IV over 15 sec. If after 45 sec no response, administer 0.2 mg again over 1 min; may repeat at 1 min intervals; not to exceed 4 doses (1 mg)  . fentaNYL (SUBLIMAZE) injection 25-50 mcg    Make sure Narcan is available in the pyxis when using this medication. In the event of respiratory depression (RR< 8/min): Titrate NARCAN (naloxone) in increments of 0.1 to 0.2 mg IV at 2-3 minute intervals, until desired degree of reversal.  . lidocaine (XYLOCAINE) 2 % (with pres) injection 200 mg  . triamcinolone acetonide (KENALOG-40) injection 40 mg  . ropivacaine (PF) 2 mg/mL (0.2%) (NAROPIN) injection 9 mL  . HYDROcodone-acetaminophen (NORCO/VICODIN) 5-325 MG tablet    Sig: Take 1 tablet by mouth every 8 (eight) hours as needed for severe pain.    Dispense:  30 tablet    Refill:  0    For acute post-operative pain. Not to be refilled. To last until: 04/25/17   Medications administered: We administered midazolam, fentaNYL, lidocaine, triamcinolone acetonide, and ropivacaine (PF) 2 mg/mL (0.2%).  See the medical record for exact dosing, route, and time of administration.  New Prescriptions   No medications on file   Disposition: Discharge home  Discharge Date & Time: 04/15/2017; 1224 hrs.   Physician-requested Follow-up: Return for post-RFA eval w/ Dionisio David, NP. Future Appointments Date Time Provider [place]  04/24/2017 2:30 PM Vevelyn Francois, NP [facility] None  05/27/2017 11:15 AM Vevelyn Francois, NP [facility] None   Primary Care Physician: Marygrace Drought, MD Location: [facility] Note by: Gaspar Cola, MD Date: 04/15/2017; Time: 12:27 PM  Disclaimer:  Medicine is not an Chief Strategy Officer. The only guarantee in medicine is that nothing is guaranteed. It is important to note that the decision to proceed with this intervention was based on the information collected from the patient. The Data and conclusions were drawn from the patient's questionnaire, the interview, and the physical examination. Because the information was  provided in large part by the patient, it cannot be guaranteed that it has not been purposely or unconsciously manipulated. Every effort has been made to obtain as much relevant data as possible for this evaluation. It is important to note that the conclusions that lead to this procedure are derived in large part from the available data. Always take into account that the treatment will also be dependent on availability of resources and existing treatment guidelines, considered by other Pain Management Practitioners as being common knowledge and practice, at the time of the intervention. For Medico-Legal purposes, it is also important to point out that variation in procedural techniques and pharmacological choices are the acceptable norm. The indications, contraindications, technique, and results of the above procedure should only be interpreted and judged by a Board-Certified Interventional Pain Specialist with extensive familiarity and expertise in the same exact procedure and technique.

## 2017-04-16 ENCOUNTER — Telehealth: Payer: Self-pay | Admitting: *Deleted

## 2017-04-16 NOTE — Telephone Encounter (Signed)
Attempted to call for post procedure follow-up. Message left. 

## 2017-04-24 ENCOUNTER — Ambulatory Visit: Payer: Medicaid Other | Attending: Nurse Practitioner | Admitting: Nurse Practitioner

## 2017-04-24 ENCOUNTER — Encounter: Payer: Self-pay | Admitting: Nurse Practitioner

## 2017-04-24 VITALS — BP 152/80 | HR 81 | Temp 98.9°F | Resp 18 | Ht 65.0 in | Wt 248.0 lb

## 2017-04-24 DIAGNOSIS — E785 Hyperlipidemia, unspecified: Secondary | ICD-10-CM | POA: Insufficient documentation

## 2017-04-24 DIAGNOSIS — M5441 Lumbago with sciatica, right side: Secondary | ICD-10-CM | POA: Diagnosis not present

## 2017-04-24 DIAGNOSIS — F17210 Nicotine dependence, cigarettes, uncomplicated: Secondary | ICD-10-CM | POA: Diagnosis not present

## 2017-04-24 DIAGNOSIS — M62830 Muscle spasm of back: Secondary | ICD-10-CM

## 2017-04-24 DIAGNOSIS — M7918 Myalgia, other site: Secondary | ICD-10-CM

## 2017-04-24 DIAGNOSIS — Z5181 Encounter for therapeutic drug level monitoring: Secondary | ICD-10-CM | POA: Diagnosis not present

## 2017-04-24 DIAGNOSIS — K589 Irritable bowel syndrome without diarrhea: Secondary | ICD-10-CM | POA: Insufficient documentation

## 2017-04-24 DIAGNOSIS — G894 Chronic pain syndrome: Secondary | ICD-10-CM | POA: Diagnosis not present

## 2017-04-24 DIAGNOSIS — M545 Low back pain: Secondary | ICD-10-CM | POA: Insufficient documentation

## 2017-04-24 DIAGNOSIS — I10 Essential (primary) hypertension: Secondary | ICD-10-CM | POA: Diagnosis not present

## 2017-04-24 DIAGNOSIS — M797 Fibromyalgia: Secondary | ICD-10-CM | POA: Insufficient documentation

## 2017-04-24 DIAGNOSIS — M25552 Pain in left hip: Secondary | ICD-10-CM | POA: Diagnosis not present

## 2017-04-24 DIAGNOSIS — F329 Major depressive disorder, single episode, unspecified: Secondary | ICD-10-CM | POA: Insufficient documentation

## 2017-04-24 DIAGNOSIS — F419 Anxiety disorder, unspecified: Secondary | ICD-10-CM | POA: Diagnosis not present

## 2017-04-24 DIAGNOSIS — G4700 Insomnia, unspecified: Secondary | ICD-10-CM | POA: Insufficient documentation

## 2017-04-24 DIAGNOSIS — M25551 Pain in right hip: Secondary | ICD-10-CM | POA: Diagnosis not present

## 2017-04-24 DIAGNOSIS — M533 Sacrococcygeal disorders, not elsewhere classified: Secondary | ICD-10-CM | POA: Diagnosis not present

## 2017-04-24 DIAGNOSIS — G8929 Other chronic pain: Secondary | ICD-10-CM | POA: Diagnosis not present

## 2017-04-24 DIAGNOSIS — G510 Bell's palsy: Secondary | ICD-10-CM | POA: Diagnosis not present

## 2017-04-24 DIAGNOSIS — Z79899 Other long term (current) drug therapy: Secondary | ICD-10-CM | POA: Diagnosis not present

## 2017-04-24 DIAGNOSIS — E079 Disorder of thyroid, unspecified: Secondary | ICD-10-CM | POA: Insufficient documentation

## 2017-04-24 DIAGNOSIS — K449 Diaphragmatic hernia without obstruction or gangrene: Secondary | ICD-10-CM | POA: Diagnosis not present

## 2017-04-24 DIAGNOSIS — Z79891 Long term (current) use of opiate analgesic: Secondary | ICD-10-CM | POA: Diagnosis not present

## 2017-04-24 DIAGNOSIS — J449 Chronic obstructive pulmonary disease, unspecified: Secondary | ICD-10-CM | POA: Diagnosis not present

## 2017-04-24 MED ORDER — DICLOFENAC SODIUM 1 % TD GEL: 2.0000 g | Freq: Four times a day (QID) | TRANSDERMAL | 2 refills | Status: DC

## 2017-04-24 MED ORDER — OXYCODONE HCL 10 MG PO TABS: 10.0000 mg | ORAL_TABLET | Freq: Four times a day (QID) | ORAL | 0 refills | Status: DC | PRN

## 2017-04-24 MED ORDER — CYCLOBENZAPRINE HCL 10 MG PO TABS: 10.0000 mg | ORAL_TABLET | Freq: Three times a day (TID) | ORAL | 2 refills | Status: DC | PRN

## 2017-04-24 NOTE — Patient Instructions (Addendum)
____________________________________________________________________________________________  Medication Rules  Applies to: All patients receiving prescriptions (written or electronic).  Pharmacy of record: Pharmacy where electronic prescriptions will be sent. If written prescriptions are taken to a different pharmacy, please inform the nursing staff. The pharmacy listed in the electronic medical record should be the one where you would like electronic prescriptions to be sent.  Prescription refills: Only during scheduled appointments. Applies to both, written and electronic prescriptions.  NOTE: The following applies primarily to controlled substances (Opioid* Pain Medications).   Patient's responsibilities: 1. Pain Pills: Bring all pain pills to every appointment (except for procedure appointments). 2. Pill Bottles: Bring pills in original pharmacy bottle. Always bring newest bottle. Bring bottle, even if empty. 3. Medication refills: You are responsible for knowing and keeping track of what medications you need refilled. The day before your appointment, write a list of all prescriptions that need to be refilled. Bring that list to your appointment and give it to the admitting nurse. Prescriptions will be written only during appointments. If you forget a medication, it will not be "Called in", "Faxed", or "electronically sent". You will need to get another appointment to get these prescribed. 4. Prescription Accuracy: You are responsible for carefully inspecting your prescriptions before leaving our office. Have the discharge nurse carefully go over each prescription with you, before taking them home. Make sure that your name is accurately spelled, that your address is correct. Check the name and dose of your medication to make sure it is accurate. Check the number of pills, and the written instructions to make sure they are clear and accurate. Make sure that you are given enough medication to  last until your next medication refill appointment. 5. Taking Medication: Take medication as prescribed. Never take more pills than instructed. Never take medication more frequently than prescribed. Taking less pills or less frequently is permitted and encouraged, when it comes to controlled substances (written prescriptions).  6. Inform other Doctors: Always inform, all of your healthcare providers, of all the medications you take. 7. Pain Medication from other Providers: You are not allowed to accept any additional pain medication from any other Doctor or Healthcare provider. There are two exceptions to this rule. (see below) In the event that you require additional pain medication, you are responsible for notifying us, as stated below. 8. Medication Agreement: You are responsible for carefully reading and following our Medication Agreement. This must be signed before receiving any prescriptions from our practice. Safely store a copy of your signed Agreement. Violations to the Agreement will result in no further prescriptions. (Additional copies of our Medication Agreement are available upon request.) 9. Laws, Rules, & Regulations: All patients are expected to follow all Federal and State Laws, Statutes, Rules, & Regulations. Ignorance of the Laws does not constitute a valid excuse. The use of any illegal substances is prohibited. 10. Adopted CDC guidelines & recommendations: Target dosing levels will be at or below 60 MME/day. Use of benzodiazepines** is not recommended.  Exceptions: There are only two exceptions to the rule of not receiving pain medications from other Healthcare Providers. 1. Exception #1 (Emergencies): In the event of an emergency (i.e.: accident requiring emergency care), you are allowed to receive additional pain medication. However, you are responsible for: As soon as you are able, call our office [phone], at any time of the day or night, and leave a message stating your  name, the date and nature of the emergency, and the name and dose of the medication   prescribed. In the event that your call is answered by a member of our staff, make sure to document and save the date, time, and the name of the person that took your information.  2. Exception #2 (Planned Surgery): In the event that you are scheduled by another doctor or dentist to have any type of surgery or procedure, you are allowed (for a period no longer than 30 days), to receive additional pain medication, for the acute post-op pain. However, in this case, you are responsible for picking up a copy of our "Post-op Pain Management for Surgeons" handout, and giving it to your surgeon or dentist. This document is available at our office, and does not require an appointment to obtain it. Simply go to our office during business hours (Monday-Thursday from 8:00 AM to 4:00 PM) (Friday 8:00 AM to 12:00 Noon) or if you have a scheduled appointment with [place], prior to your surgery, and ask for it by name. In addition, you will need to provide [place] with your name, name of your surgeon, type of surgery, and date of procedure or surgery.  *Opioid medications include: morphine, codeine, oxycodone, oxymorphone, hydrocodone, hydromorphone, meperidine, tramadol, tapentadol, buprenorphine, fentanyl, methadone. **Benzodiazepine medications include: diazepam (Valium), alprazolam (Xanax), clonazepam (Klonopine), lorazepam (Ativan), clorazepate (Tranxene), chlordiazepoxide (Librium), estazolam (Prosom), oxazepam (Serax), temazepam (Restoril), triazolam (Halcion)  ____________________________________________________________________________________________  BMI Assessment: Estimated body mass index is 41.27 kg/m as calculated from the following:   Height as of this encounter: 5\' 5"  (1.651 m).   Weight as of this encounter: 248 lb (112.5 kg).  BMI interpretation table: BMI level Category Range association with higher incidence of chronic pain   <18 kg/m2 Underweight   18.5-24.9 kg/m2 Ideal body weight   25-29.9 kg/m2 Overweight Increased incidence by 20%  30-34.9 kg/m2 Obese (Class I) Increased incidence by 68%  35-39.9 kg/m2 Severe obesity (Class II) Increased incidence by 136%  >40 kg/m2 Extreme obesity (Class III) Increased incidence by 254%   BMI Readings from Last 4 Encounters:  04/24/17 41.27 kg/m  04/15/17 41.27 kg/m  02/05/17 43.27 kg/m  01/23/17 41.60 kg/m   Wt Readings from Last 4 Encounters:  04/24/17 248 lb (112.5 kg)  04/15/17 248 lb (112.5 kg)  02/05/17 260 lb (117.9 kg)  01/23/17 250 lb (113.4 kg)

## 2017-04-24 NOTE — Progress Notes (Signed)
Patient's Name: Danielle [name]  MRN: [id number]  Referring Provider: Marygrace Drought, MD  DOB: August 15, 1960  PCP: Marygrace Drought, MD  DOS: 04/24/2017  Note by: Vevelyn Francois NP  Service setting: Ambulatory outpatient  Specialty: Interventional Pain Management  Location: [facility] (AMB) Pain Management Facility    Patient type: Established    Primary Reason(s) for Visit: Encounter for prescription drug management. (Level of risk: moderate)  CC: No chief complaint on file.  HPI  Danielle [name] is a 56 y.o. year old, female patient, who comes today for a medication management evaluation. She has Long term current use of opiate analgesic; Long term prescription opiate use; Opiate use (60 MME/Day); B12 deficiency; Cervical spinal cord compression [facility]) (April, 2017); Cervical spinal stenosis; Chronic obstructive pulmonary disease ([place]); Clinical depression; BP (high blood pressure); Adult hypothyroidism; Adiposity; Mucositis oral; Groin pain; Current tobacco use; Chronic shoulder pain (Location of Primary Source of Pain) (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); History of total shoulder replacement (Bilateral); Chronic hip pain (Location of Secondary source of pain) (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); 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); Chronic low back pain (Location of Tertiary source of pain) (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); Lumbar facet syndrome (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); Lumbar spondylosis; Failed back surgical syndrome; Chronic lower extremity pain (Right); Chronic neck pain (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); Chronic cervical radicular pain (Right); Ulnar neuropathy of left upper extremity; Hx of cervical spine surgery; Cervical spondylosis; Encounter for therapeutic drug level monitoring; Encounter for pain management planning; Fibromyalgia; Chronic sacroiliac pain (B) (R>L); Seasonal allergic rhinitis; Status post total replacement of right shoulder; Depression; Hypertension; Hypothyroid; Chronic pain syndrome; Complaints of weakness of lower extremity; Morbid obesity with BMI  of 40.0-44.9, adult ([place]); Anterolisthesis; Cervical facet syndrome ([place]); Chronic musculoskeletal pain; Muscle spasm of back; Encounter for screening colonoscopy; Rectal bleeding; Mixed simple and mucopurulent chronic bronchitis ([place]); Acute postoperative pain; Nausea; and Vasovagal episode on her problem list. Her primarily concern today is the No chief complaint on file.  Pain Assessment: Location: Lower Back Radiating: left hip Onset: More than a month ago Duration: Chronic pain Quality: Sore, Aching Severity: 5 /10 (self-reported pain score)  Note: Reported level is compatible with observation.                    Effect on ADL:   Timing: Constant Modifying factors: medications, voltern gel, rest, heat  Ms. Mcburney was last scheduled for an appointment on 03/27/2017 for medication management. During today's appointment we reviewed [name]'s chronic pain status, as well as her outpatient medication regimen. She is SP left lumbar facet RFA. She states that her right RFA was not as bad as this one. She admits that the left side is also where she has hip pain. She admits to having difficulty lifting her leg. She denies any weakness in her leg. She states that it just hurts with lifting on her leg onto her high bed.   The patient  reports that she does not use drugs. Her body mass index is 41.27 kg/m.  Further details on both, my assessment(s), as well as the proposed treatment plan, please see below.  Controlled Substance Pharmacotherapy Assessment REMS (Risk Evaluation and Mitigation Strategy)  Analgesic:Oxycodone IR 10 mg every 6 hours (40 mg/dayof oxycodone) MME/day:74m/day  SHart Rochester RN  04/24/2017 11:29 AM  Sign at close encounter Nursing Pain Medication Assessment:  Safety precautions to be maintained throughout the outpatient stay will include: orient to surroundings, keep bed in low position, maintain call bell within reach at all times, provide assistance with  transfer out of bed and ambulation.  Medication Inspection Compliance: Pill count conducted under aseptic conditions, in front of the patient. Neither the pills nor the bottle was removed from the patient's sight at any time. Once count was completed pills were immediately returned to the patient in their original bottle.  Medication: See above Pill/Patch Count: 18 of 120 pills remain Pill/Patch Appearance: Markings consistent with prescribed medication Bottle Appearance: Standard pharmacy container. Clearly labeled. Filled Date: 09 / 13 / 2018 Last Medication intake:  Today   Pharmacokinetics: Liberation and absorption (onset of action): WNL Distribution (time to peak effect): WNL Metabolism and excretion (duration of action): WNL         Pharmacodynamics: Desired effects: Analgesia: [name] reports >50% benefit. Functional ability: Patient reports that medication allows her to accomplish basic ADLs Clinically meaningful improvement in function (CMIF): Sustained CMIF goals met Perceived effectiveness: Described as relatively effective, allowing for increase in activities of daily living (ADL) Undesirable effects: Side-effects or Adverse reactions: None reported Monitoring: [place] PMP: Online review of the past 18-monthperiod conducted. Compliant with practice rules and regulations Last UDS on record: Summary  Date Value Ref Range Status  01/23/2017 FINAL  Final    Comment:    ==================================================================== TOXASSURE SELECT 13 (MW) ==================================================================== Test                             Result       Flag       Units Drug Present and Declared for Prescription Verification   Oxycodone                      4050         EXPECTED   ng/mg creat   Oxymorphone                    184          EXPECTED   ng/mg creat   Noroxycodone                   5699         EXPECTED   ng/mg creat   Noroxymorphone                  83           EXPECTED   ng/mg creat    Sources of oxycodone are scheduled prescription medications.    Oxymorphone, noroxycodone, and noroxymorphone are expected    metabolites of oxycodone. Oxymorphone is also available as a    scheduled prescription medication. Drug Present not Declared for Prescription Verification   Oxazepam                       49           UNEXPECTED ng/mg creat   Temazepam                      21           UNEXPECTED ng/mg creat    Oxazepam and temazepam are expected metabolites of diazepam.    Oxazepam is also an expected metabolite of other benzodiazepine    drugs, including chlordiazepoxide, prazepam, clorazepate,    halazepam, and temazepam.  Oxazepam and temazepam are available    as scheduled prescription medications. Drug Absent but Declared for Prescription Verification   Hydrocodone  Not Detected UNEXPECTED ng/mg creat ==================================================================== Test                      Result    Flag   Units      Ref Range   Creatinine              173              mg/dL      >=20 ==================================================================== Declared Medications:  The flagging and interpretation on this report are based on the  following declared medications.  Unexpected results may arise from  inaccuracies in the declared medications.  **Note: The testing scope of this panel includes these medications:  Hydrocodone (Norco)  Oxycodone  **Note: The testing scope of this panel does not include following  reported medications:  Acetaminophen (Norco)  Albuterol  Cetirizine  Citalopram (Celexa)  Cyclobenzaprine (Flexeril)  Diclofenac (Voltaren)  Docusate (Colace)  Fluconazole (Diflucan)  Levothyroxine  Loratadine (Claritin)  Magnesium  Montelukast (Singulair)  Naloxone (Narcan)  Nystatin  Polyethylene Glycol  Tiotropium (Spiriva)  Vitamin B12  Vitamin  D3 ==================================================================== For clinical consultation, please call [phone]. ====================================================================    UDS interpretation: Non-Compliant Patient reminded of the CDC guidelines recommending to stay away from the sedatives & benzodiazepines due to the risk of respiratory depression and death. Medication Assessment Form: Reviewed. Abnormalities discussed Treatment compliance: Deficiencies noted and steps taken to remind the patient of the seriousness of adequate therapy compliance Risk Assessment Profile: Aberrant behavior: See prior evaluations. None observed or detected today Comorbid factors increasing risk of overdose: See prior notes. No additional risks detected today Risk of substance use disorder (SUD): Low     Opioid Risk Tool - 04/15/17 1019      Family History of Substance Abuse   Alcohol Negative   Illegal Drugs Negative   Rx Drugs Negative     Personal History of Substance Abuse   Alcohol Negative   Illegal Drugs Negative   Rx Drugs Negative     Psychological Disease   Psychological Disease Positive   ADD Negative   OCD Negative   Bipolar Negative   Schizophrenia Negative   Depression Positive  patient taking Celexia which is handling.     Total Score   Opioid Risk Tool Scoring 3   Opioid Risk Interpretation Low Risk     ORT Scoring interpretation table:  Score <3 = Low Risk for SUD  Score between 4-7 = Moderate Risk for SUD  Score >8 = High Risk for Opioid Abuse   Risk Mitigation Strategies:  Patient Counseling: Covered Patient-Prescriber Agreement (PPA): Present and active  Notification to other healthcare providers: Done  Pharmacologic Plan: No change in therapy, at this time  Laboratory Chemistry  Inflammation Markers (CRP: Acute Phase) (ESR: Chronic Phase) Lab Results  Component Value Date   CRP 0.9 01/08/2016   ESRSEDRATE 37 (H) 01/08/2016                  Renal Function Markers Lab Results  Component Value Date   BUN 8 01/08/2016   CREATININE 0.94 01/08/2016   GFRAA >60 01/08/2016   GFRNONAA >60 01/08/2016                 Hepatic Function Markers Lab Results  Component Value Date   AST 18 01/08/2016   ALT 14 01/08/2016   ALBUMIN 3.7 01/08/2016   ALKPHOS 79 01/08/2016  Electrolytes Lab Results  Component Value Date   NA 137 01/08/2016   K 3.5 01/08/2016   CL 101 01/08/2016   CALCIUM 9.0 01/08/2016   MG 2.1 01/08/2016                 Neuropathy Markers Lab Results  Component Value Date   VITAMINB12 >7,500 (H) 01/08/2016                 Bone Pathology Markers Lab Results  Component Value Date   ALKPHOS 79 01/08/2016   25OHVITD1 32 01/08/2016   25OHVITD2 2.0 01/08/2016   25OHVITD3 30 01/08/2016   CALCIUM 9.0 01/08/2016                 Coagulation Parameters No results found for: INR, LABPROT, APTT, PLT               Cardiovascular Markers No results found for: BNP, HGB, HCT               Note: Lab results reviewed.  Recent Diagnostic Imaging Results  DG C-Arm 1-60 Min-No Report Fluoroscopy was utilized by the requesting physician.  No radiographic  interpretation.   Complexity Note: Imaging results reviewed. Results shared with Ms. Domingo Cocking, using Layman's terms.                         Meds   Current Outpatient Prescriptions:  .  albuterol (PROVENTIL HFA;VENTOLIN HFA) 108 (90 Base) MCG/ACT inhaler, Inhale 2 puffs into the lungs every 4 (four) hours as needed for wheezing or shortness of breath. , Disp: , Rfl:  .  Biotin 10 MG CAPS, Take 1 tablet by mouth daily., Disp: , Rfl:  .  Cholecalciferol (VITAMIN D3) 2000 units capsule, Take 2,000 Units by mouth daily. , Disp: , Rfl:  .  citalopram (CELEXA) 40 MG tablet, Take 40 mg by mouth daily., Disp: , Rfl: 3 .  cyanocobalamin (,VITAMIN B-12,) 1000 MCG/ML injection, Inject into the muscle., Disp: , Rfl:  .  [START ON 04/29/2017]  cyclobenzaprine (FLEXERIL) 10 MG tablet, Take 1 tablet (10 mg total) by mouth 3 (three) times daily as needed for muscle spasms., Disp: 90 tablet, Rfl: 2 .  [START ON 04/29/2017] diclofenac sodium (VOLTAREN) 1 % GEL, Apply 2 g topically 4 (four) times daily., Disp: 100 g, Rfl: 2 .  docusate sodium (COLACE) 100 MG capsule, Take 100 mg by mouth 2 (two) times daily., Disp: , Rfl:  .  fluconazole (DIFLUCAN) 100 MG tablet, TAKE TWO ON THE FIRST DAY. THEN ONE DAILY FOR 14 DAYS., Disp: , Rfl: 0 .  levothyroxine (SYNTHROID, LEVOTHROID) 50 MCG tablet, TAKE 1 TABLET (50 MCG TOTAL) BY MOUTH DAILY AT 0600., Disp: , Rfl: 11 .  loratadine (CLARITIN) 10 MG tablet, TAKE 1 TABLET (10 MG TOTAL) BY MOUTH DAILY., Disp: , Rfl: 11 .  magnesium hydroxide (MILK OF MAGNESIA) 400 MG/5ML suspension, Take by mouth., Disp: , Rfl:  .  montelukast (SINGULAIR) 10 MG tablet, TAKE 1 TABLET (10 MG TOTAL) BY MOUTH NIGHTLY., Disp: , Rfl: 3 .  naloxone (NARCAN) 2 MG/2ML injection, Inject content of syringe into thigh muscle. Call 911., Disp: 2 Syringe, Rfl: 1 .  nystatin (MYCOSTATIN/NYSTOP) powder, Apply topically., Disp: , Rfl:  .  [START ON 04/26/2017] Oxycodone HCl 10 MG TABS, Take 1 tablet (10 mg total) by mouth every 6 (six) hours as needed., Disp: 120 tablet, Rfl: 0 .  SPIRIVA HANDIHALER 18 MCG inhalation capsule,  PLACE 1 CAPSULE (18 MCG TOTAL) INTO INHALER AND INHALE ONCE DAILY., Disp: , Rfl: 5 .  cetirizine (ZYRTEC) 10 MG tablet, Take 10 mg by mouth daily. , Disp: , Rfl:  .  [START ON 05/26/2017] Oxycodone HCl 10 MG TABS, Take 1 tablet (10 mg total) by mouth every 6 (six) hours as needed., Disp: 120 tablet, Rfl: 0 .  [START ON 06/25/2017] Oxycodone HCl 10 MG TABS, Take 1 tablet (10 mg total) by mouth every 6 (six) hours as needed., Disp: 120 tablet, Rfl: 0  ROS  Constitutional: Denies any fever or chills Gastrointestinal: No reported hemesis, hematochezia, vomiting, or acute GI distress Musculoskeletal: Denies any acute onset  joint swelling, redness, loss of ROM, or weakness Neurological: No reported episodes of acute onset apraxia, aphasia, dysarthria, agnosia, amnesia, paralysis, loss of coordination, or loss of consciousness  Allergies  Ms. Kronick is allergic to aripiprazole; duloxetine; gabapentin; gold; iodinated diagnostic agents; naproxen sodium; nortriptyline hcl; nsaids; pregabalin; ace inhibitors; aspirin; cefpodoxime; fluoxetine; fluticasone-salmeterol; levofloxacin; lithium; meperidine; paroxetine; tape; telithromycin; theophylline; topiramate; trazodone; triamcinolone; venlafaxine; bupropion; ketorolac tromethamine; morphine; and moxifloxacin.  [place]  Drug: Ms. Anastacio  reports that she does not use drugs. Alcohol:  reports that she does not drink alcohol. Tobacco:  reports that she has been smoking Cigarettes.  She has a 40.00 pack-year smoking history. She has never used smokeless tobacco. Medical:  has a past medical history of Anxiety; Arthritis; Asthma; Bell's palsy; Bursitis; COPD (chronic obstructive pulmonary disease) ([place]); Depression; Fibromyalgia; Heart murmur; Hepatitis C; Hiatal hernia; Hyperlipidemia; Hypertension; IBS (irritable bowel syndrome); Insomnia; and Thyroid disease. Surgical: Ms. Gault  has a past surgical history that includes necksurgery (10/21/2014); Back surgery (2013); Foot surgery (Right); Elbow surgery (Left); Carpal tunnel release (Right); Nose surgery; Partial hysterectomy; Total shoulder replacement (Bilateral); and Colonoscopy with propofol (N/A, 02/05/2017). Family: family history includes Cancer in her mother; Gout in her mother; Heart disease in her father.  Constitutional Exam  General appearance: Well nourished, well developed, and well hydrated. In no apparent acute distress Vitals:   04/24/17 1123  BP: (!) 152/80  Pulse: 81  Resp: 18  Temp: 98.9 F (37.2 C)  TempSrc: Oral  SpO2: 97%  Weight: 248 lb (112.5 kg)  Height: '5\' 5"'  (1.651 m)   BMI Assessment:  Estimated body mass index is 41.27 kg/m as calculated from the following:   Height as of this encounter: '5\' 5"'  (1.651 m).   Weight as of this encounter: 248 lb (112.5 kg). Psych/Mental status: Alert, oriented x 3 (person, place, & time)       Eyes: PERLA Respiratory: No evidence of acute respiratory distress  Cervical Spine Area Exam  Skin & Axial Inspection: No masses, redness, edema, swelling, or associated skin lesions Alignment: Symmetrical Functional ROM: Unrestricted ROM      Stability: No instability detected Muscle Tone/Strength: Functionally intact. No obvious neuro-muscular anomalies detected. Sensory (Neurological): Unimpaired Palpation: No palpable anomalies              Upper Extremity (UE) Exam    Side: Right upper extremity  Side: Left upper extremity  Skin & Extremity Inspection: Skin color, temperature, and hair growth are WNL. No peripheral edema or cyanosis. No masses, redness, swelling, asymmetry, or associated skin lesions. No contractures.  Skin & Extremity Inspection: Skin color, temperature, and hair growth are WNL. No peripheral edema or cyanosis. No masses, redness, swelling, asymmetry, or associated skin lesions. No contractures.  Functional ROM: Unrestricted ROM  Functional ROM: Unrestricted ROM          Muscle Tone/Strength: Functionally intact. No obvious neuro-muscular anomalies detected.  Muscle Tone/Strength: Functionally intact. No obvious neuro-muscular anomalies detected.  Sensory (Neurological): Unimpaired          Sensory (Neurological): Unimpaired          Palpation: No palpable anomalies              Palpation: No palpable anomalies              Specialized Test(s): Deferred         Specialized Test(s): Deferred          Thoracic Spine Area Exam  Skin & Axial Inspection: No masses, redness, or swelling Alignment: Symmetrical Functional ROM: Unrestricted ROM Stability: No instability detected Muscle Tone/Strength: Functionally intact. No  obvious neuro-muscular anomalies detected. Sensory (Neurological): Unimpaired Muscle strength & Tone: No palpable anomalies  Lumbar Spine Area Exam  Skin & Axial Inspection: No masses, redness, or swelling Alignment: Symmetrical Functional ROM: Unrestricted ROM      Stability: No instability detected Muscle Tone/Strength: Functionally intact. No obvious neuro-muscular anomalies detected. Sensory (Neurological): Unimpaired Palpation: Complains of area being tender to palpation       Provocative Tests: Lumbar Hyperextension and rotation test: Positive bilaterally for facet joint pain. Lumbar Lateral bending test: evaluation deferred today       Patrick's Maneuver: evaluation deferred today                    Gait & Posture Assessment  Ambulation: Unassisted Gait: Relatively normal for age and body habitus Posture: WNL   Lower Extremity Exam    Side: Right lower extremity  Side: Left lower extremity  Skin & Extremity Inspection: Skin color, temperature, and hair growth are WNL. No peripheral edema or cyanosis. No masses, redness, swelling, asymmetry, or associated skin lesions. No contractures.  Skin & Extremity Inspection: Skin color, temperature, and hair growth are WNL. No peripheral edema or cyanosis. No masses, redness, swelling, asymmetry, or associated skin lesions. No contractures.  Functional ROM: Unrestricted ROM          Functional ROM: Unrestricted ROM          Muscle Tone/Strength: Functionally intact. No obvious neuro-muscular anomalies detected.  Muscle Tone/Strength: Functionally intact. No obvious neuro-muscular anomalies detected.  Sensory (Neurological): Unimpaired  Sensory (Neurological): Unimpaired  Palpation: No palpable anomalies  Palpation: No palpable anomalies   Assessment  Primary Diagnosis & Pertinent Problem List: The primary encounter diagnosis was Chronic low back pain (Location of Tertiary source of pain) (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). Diagnoses of Chronic pain of  both hips, Chronic sacroiliac pain (B) (R>L), Chronic musculoskeletal pain, Muscle spasm of back, Chronic pain syndrome, and Long term current use of opiate analgesic were also pertinent to this visit.  Status Diagnosis  Controlled Controlled Controlled 1. Chronic low back pain (Location of Tertiary source of pain) (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)   2. Chronic pain of both hips   3. Chronic sacroiliac pain (B) (R>L)   4. Chronic musculoskeletal pain   5. Muscle spasm of back   6. Chronic pain syndrome   7. Long term current use of opiate analgesic     Problems updated and reviewed during this visit: No problems updated. Plan of Care  Pharmacotherapy (Medications Ordered): Meds ordered this encounter  Medications  . cyclobenzaprine (FLEXERIL) 10 MG tablet    Sig: Take 1 tablet (10 mg total) by mouth 3 (three) times daily as  needed for muscle spasms.    Dispense:  90 tablet    Refill:  2    Do not place medication on "Automatic Refill". Fill one day early if pharmacy is closed on scheduled refill date.    Order Specific Question:   Supervising Provider    Answer:   Milinda Pointer [phone]  . diclofenac sodium (VOLTAREN) 1 % GEL    Sig: Apply 2 g topically 4 (four) times daily.    Dispense:  100 g    Refill:  2    Order Specific Question:   Supervising Provider    Answer:   Milinda Pointer [phone]  . Oxycodone HCl 10 MG TABS    Sig: Take 1 tablet (10 mg total) by mouth every 6 (six) hours as needed.    Dispense:  120 tablet    Refill:  0    Do not add this medication to the electronic "Automatic Refill" notification system. Patient may have prescription filled one day early if pharmacy is closed on scheduled refill date. Do not fill until: 04/26/2017 To last until:05/26/2017    Order Specific Question:   Supervising Provider    Answer:   Milinda Pointer [phone]  . Oxycodone HCl 10 MG TABS    Sig: Take 1 tablet (10 mg total) by mouth every 6 (six) hours as needed.    Dispense:   120 tablet    Refill:  0    Do not add this medication to the electronic "Automatic Refill" notification system. Patient may have prescription filled one day early if pharmacy is closed on scheduled refill date. Do not fill until: 05/26/2017 To last until:06/25/2017    Order Specific Question:   Supervising Provider    Answer:   Milinda Pointer [phone]  . Oxycodone HCl 10 MG TABS    Sig: Take 1 tablet (10 mg total) by mouth every 6 (six) hours as needed.    Dispense:  120 tablet    Refill:  0    Do not add this medication to the electronic "Automatic Refill" notification system. Patient may have prescription filled one day early if pharmacy is closed on scheduled refill date. Do not fill until: 06/25/2017 To last until: 07/25/2017    Order Specific Question:   Supervising Provider    Answer:   Milinda Pointer [phone]   New Prescriptions   No medications on file   Medications administered today: Ms. Stehlin had no medications administered during this visit. Lab-work, procedure(s), and/or referral(s): Orders Placed This Encounter  Procedures  . ToxASSURE Select 13 (MW), Urine   Imaging and/or referral(s): None  Interventional therapies: Planned, scheduled, and/or pending:  Not at this time    Considering:  Palliative bilateralsuprascapular nerve block Possible bilateral suprascapular nerve radiofrequencyablation Diagnostic bilateral intra-articular hip joint injection Possible bilateral hip joint radiofrequencyablation Diagnostic bilateral lumbar facet block Possible bilaterallumbar facet radiofrequencyablation Diagnostic caudal epiduralsteroid injection + diagnostic epidurogram Possible Racz procedure. Diagnostic bilateral cervical facet block Possible bilateral cervical facet radiofrequencyablation Diagnostic right-sided cervical epidural steroid injection Diagnostic bilateral sacroiliac joint block Possible bilateral sacroiliac joint RFA    Palliative PRN treatment(s):  Palliative bilateralsuprascapular nerve block Diagnostic bilateral intra-articular hip joint injection Diagnostic bilateral lumbar facet block Diagnostic caudal epiduralsteroid injection + diagnostic epidurogram Diagnostic bilateral cervical facet block Diagnostic right-sided cervical epidural steroid injection Diagnostic bilateral sacroiliac joint block   Provider-requested follow-up: Return in about 3 months (around 07/25/2017) for MedMgmt.  Future Appointments Date Time Provider [place]  05/27/2017 11:15 AM Edison Pace,  Diona Foley, NP [facility] None  07/24/2017 2:30 PM Vevelyn Francois, NP [facility] None   Primary Care Physician: Marygrace Drought, MD Location: [facility] Outpatient Pain Management Facility Note by: Vevelyn Francois NP Date: 04/24/2017; Time: 3:44 PM  Pain Score Disclaimer: We use the NRS-11 scale. This is a self-reported, subjective measurement of pain severity with only modest accuracy. It is used primarily to identify changes within a particular patient. It must be understood that outpatient pain scales are significantly less accurate that those used for research, where they can be applied under ideal controlled circumstances with minimal exposure to variables. In reality, the score is likely to be a combination of pain intensity and pain affect, where pain affect describes the degree of emotional arousal or changes in action readiness caused by the sensory experience of pain. Factors such as social and work situation, setting, emotional state, anxiety levels, expectation, and prior pain experience may influence pain perception and show large inter-individual differences that may also be affected by time variables.  Patient instructions provided during this appointment: Patient Instructions    ____________________________________________________________________________________________  Medication Rules  Applies to: All patients  receiving prescriptions (written or electronic).  Pharmacy of record: Pharmacy where electronic prescriptions will be sent. If written prescriptions are taken to a different pharmacy, please inform the nursing staff. The pharmacy listed in the electronic medical record should be the one where you would like electronic prescriptions to be sent.  Prescription refills: Only during scheduled appointments. Applies to both, written and electronic prescriptions.  NOTE: The following applies primarily to controlled substances (Opioid* Pain Medications).   Patient's responsibilities: 1. Pain Pills: Bring all pain pills to every appointment (except for procedure appointments). 2. Pill Bottles: Bring pills in original pharmacy bottle. Always bring newest bottle. Bring bottle, even if empty. 3. Medication refills: You are responsible for knowing and keeping track of what medications you need refilled. The day before your appointment, write a list of all prescriptions that need to be refilled. Bring that list to your appointment and give it to the admitting nurse. Prescriptions will be written only during appointments. If you forget a medication, it will not be "Called in", "Faxed", or "electronically sent". You will need to get another appointment to get these prescribed. 4. Prescription Accuracy: You are responsible for carefully inspecting your prescriptions before leaving our office. Have the discharge nurse carefully go over each prescription with you, before taking them home. Make sure that your name is accurately spelled, that your address is correct. Check the name and dose of your medication to make sure it is accurate. Check the number of pills, and the written instructions to make sure they are clear and accurate. Make sure that you are given enough medication to last until your next medication refill appointment. 5. Taking Medication: Take medication as prescribed. Never take more pills than instructed.  Never take medication more frequently than prescribed. Taking less pills or less frequently is permitted and encouraged, when it comes to controlled substances (written prescriptions).  6. Inform other Doctors: Always inform, all of your healthcare providers, of all the medications you take. 7. Pain Medication from other Providers: You are not allowed to accept any additional pain medication from any other Doctor or Healthcare provider. There are two exceptions to this rule. (see below) In the event that you require additional pain medication, you are responsible for notifying us, as stated below. 8. Medication Agreement: You are responsible for carefully reading and following our Medication Agreement.  This must be signed before receiving any prescriptions from our practice. Safely store a copy of your signed Agreement. Violations to the Agreement will result in no further prescriptions. (Additional copies of our Medication Agreement are available upon request.) 9. Laws, Rules, & Regulations: All patients are expected to follow all Federal and [employer], [employer], Rules, [employer]. Ignorance of the Laws does not constitute a valid excuse. The use of any illegal substances is prohibited. 10. Adopted CDC guidelines & recommendations: Target dosing levels will be at or below 60 MME/day. Use of benzodiazepines** is not recommended.  Exceptions: There are only two exceptions to the rule of not receiving pain medications from other Healthcare Providers. 1. Exception #1 (Emergencies): In the event of an emergency (i.e.: accident requiring emergency care), you are allowed to receive additional pain medication. However, you are responsible for: As soon as you are able, call our office [phone], at any time of the day or night, and leave a message stating your name, the date and nature of the emergency, and the name and dose of the medication prescribed. In the event that your call is answered by a member  of our staff, make sure to document and save the date, time, and the name of the person that took your information.  2. Exception #2 (Planned Surgery): In the event that you are scheduled by another doctor or dentist to have any type of surgery or procedure, you are allowed (for a period no longer than 30 days), to receive additional pain medication, for the acute post-op pain. However, in this case, you are responsible for picking up a copy of our "Post-op Pain Management for Surgeons" handout, and giving it to your surgeon or dentist. This document is available at our office, and does not require an appointment to obtain it. Simply go to our office during business hours (Monday-Thursday from 8:00 AM to 4:00 PM) (Friday 8:00 AM to 12:00 Noon) or if you have a scheduled appointment with [place], prior to your surgery, and ask for it by name. In addition, you will need to provide [place] with your name, name of your surgeon, type of surgery, and date of procedure or surgery.  *Opioid medications include: morphine, codeine, oxycodone, oxymorphone, hydrocodone, hydromorphone, meperidine, tramadol, tapentadol, buprenorphine, fentanyl, methadone. **Benzodiazepine medications include: diazepam (Valium), alprazolam (Xanax), clonazepam (Klonopine), lorazepam (Ativan), clorazepate (Tranxene), chlordiazepoxide (Librium), estazolam (Prosom), oxazepam (Serax), temazepam (Restoril), triazolam (Halcion)  ____________________________________________________________________________________________  BMI Assessment: Estimated body mass index is 41.27 kg/m as calculated from the following:   Height as of this encounter: '5\' 5"'  (1.651 m).   Weight as of this encounter: 248 lb (112.5 kg).  BMI interpretation table: BMI level Category Range association with higher incidence of chronic pain  <18 kg/m2 Underweight   18.5-24.9 kg/m2 Ideal body weight   25-29.9 kg/m2 Overweight Increased incidence by 20%  30-34.9 kg/m2 Obese (Class I)  Increased incidence by 68%  35-39.9 kg/m2 Severe obesity (Class II) Increased incidence by 136%  >40 kg/m2 Extreme obesity (Class III) Increased incidence by 254%   BMI Readings from Last 4 Encounters:  04/24/17 41.27 kg/m  04/15/17 41.27 kg/m  02/05/17 43.27 kg/m  01/23/17 41.60 kg/m   Wt Readings from Last 4 Encounters:  04/24/17 248 lb (112.5 kg)  04/15/17 248 lb (112.5 kg)  02/05/17 260 lb (117.9 kg)  01/23/17 250 lb (113.4 kg)

## 2017-04-24 NOTE — Progress Notes (Signed)
Nursing Pain Medication Assessment:  Safety precautions to be maintained throughout the outpatient stay will include: orient to surroundings, keep bed in low position, maintain call bell within reach at all times, provide assistance with transfer out of bed and ambulation.  Medication Inspection Compliance: Pill count conducted under aseptic conditions, in front of the patient. Neither the pills nor the bottle was removed from the patient's sight at any time. Once count was completed pills were immediately returned to the patient in their original bottle.  Medication: See above Pill/Patch Count: 18 of 120 pills remain Pill/Patch Appearance: Markings consistent with prescribed medication Bottle Appearance: Standard pharmacy container. Clearly labeled. Filled Date: 09 / 13 / 2018 Last Medication intake:  Today

## 2017-04-30 LAB — TOXASSURE SELECT 13 (MW), URINE

## 2017-05-08 ENCOUNTER — Other Ambulatory Visit: Payer: Self-pay | Admitting: Family Medicine

## 2017-05-08 DIAGNOSIS — Z1239 Encounter for other screening for malignant neoplasm of breast: Secondary | ICD-10-CM

## 2017-05-27 ENCOUNTER — Ambulatory Visit: Payer: Medicaid Other | Attending: Nurse Practitioner | Admitting: Nurse Practitioner

## 2017-05-27 ENCOUNTER — Other Ambulatory Visit: Payer: Self-pay

## 2017-05-27 ENCOUNTER — Encounter: Payer: Self-pay | Admitting: Nurse Practitioner

## 2017-05-27 VITALS — BP 149/100 | HR 76 | Temp 98.8°F | Resp 16 | Ht 65.0 in | Wt 243.0 lb

## 2017-05-27 DIAGNOSIS — Z9889 Other specified postprocedural states: Secondary | ICD-10-CM | POA: Diagnosis present

## 2017-05-27 DIAGNOSIS — M5441 Lumbago with sciatica, right side: Secondary | ICD-10-CM | POA: Diagnosis not present

## 2017-05-27 DIAGNOSIS — K589 Irritable bowel syndrome without diarrhea: Secondary | ICD-10-CM | POA: Diagnosis not present

## 2017-05-27 DIAGNOSIS — G894 Chronic pain syndrome: Secondary | ICD-10-CM | POA: Diagnosis not present

## 2017-05-27 DIAGNOSIS — G4700 Insomnia, unspecified: Secondary | ICD-10-CM | POA: Diagnosis not present

## 2017-05-27 DIAGNOSIS — Z96611 Presence of right artificial shoulder joint: Secondary | ICD-10-CM | POA: Insufficient documentation

## 2017-05-27 DIAGNOSIS — G510 Bell's palsy: Secondary | ICD-10-CM | POA: Diagnosis not present

## 2017-05-27 DIAGNOSIS — M47816 Spondylosis without myelopathy or radiculopathy, lumbar region: Secondary | ICD-10-CM | POA: Insufficient documentation

## 2017-05-27 DIAGNOSIS — M1990 Unspecified osteoarthritis, unspecified site: Secondary | ICD-10-CM | POA: Diagnosis not present

## 2017-05-27 DIAGNOSIS — M797 Fibromyalgia: Secondary | ICD-10-CM | POA: Insufficient documentation

## 2017-05-27 DIAGNOSIS — M25552 Pain in left hip: Secondary | ICD-10-CM | POA: Insufficient documentation

## 2017-05-27 DIAGNOSIS — E785 Hyperlipidemia, unspecified: Secondary | ICD-10-CM | POA: Diagnosis not present

## 2017-05-27 DIAGNOSIS — F17210 Nicotine dependence, cigarettes, uncomplicated: Secondary | ICD-10-CM | POA: Diagnosis not present

## 2017-05-27 DIAGNOSIS — M7918 Myalgia, other site: Secondary | ICD-10-CM | POA: Diagnosis not present

## 2017-05-27 DIAGNOSIS — Z91041 Radiographic dye allergy status: Secondary | ICD-10-CM | POA: Diagnosis not present

## 2017-05-27 DIAGNOSIS — Z885 Allergy status to narcotic agent status: Secondary | ICD-10-CM | POA: Insufficient documentation

## 2017-05-27 DIAGNOSIS — M25551 Pain in right hip: Secondary | ICD-10-CM | POA: Insufficient documentation

## 2017-05-27 DIAGNOSIS — Z6841 Body Mass Index (BMI) 40.0 and over, adult: Secondary | ICD-10-CM | POA: Insufficient documentation

## 2017-05-27 DIAGNOSIS — Z79891 Long term (current) use of opiate analgesic: Secondary | ICD-10-CM | POA: Diagnosis not present

## 2017-05-27 DIAGNOSIS — Z881 Allergy status to other antibiotic agents status: Secondary | ICD-10-CM | POA: Insufficient documentation

## 2017-05-27 DIAGNOSIS — Z90711 Acquired absence of uterus with remaining cervical stump: Secondary | ICD-10-CM | POA: Insufficient documentation

## 2017-05-27 DIAGNOSIS — Z79899 Other long term (current) drug therapy: Secondary | ICD-10-CM | POA: Insufficient documentation

## 2017-05-27 DIAGNOSIS — Z8249 Family history of ischemic heart disease and other diseases of the circulatory system: Secondary | ICD-10-CM | POA: Insufficient documentation

## 2017-05-27 DIAGNOSIS — G8929 Other chronic pain: Secondary | ICD-10-CM

## 2017-05-27 DIAGNOSIS — Z96612 Presence of left artificial shoulder joint: Secondary | ICD-10-CM | POA: Insufficient documentation

## 2017-05-27 DIAGNOSIS — Z809 Family history of malignant neoplasm, unspecified: Secondary | ICD-10-CM | POA: Insufficient documentation

## 2017-05-27 DIAGNOSIS — Z91048 Other nonmedicinal substance allergy status: Secondary | ICD-10-CM | POA: Insufficient documentation

## 2017-05-27 DIAGNOSIS — I10 Essential (primary) hypertension: Secondary | ICD-10-CM | POA: Diagnosis not present

## 2017-05-27 DIAGNOSIS — J449 Chronic obstructive pulmonary disease, unspecified: Secondary | ICD-10-CM | POA: Diagnosis not present

## 2017-05-27 DIAGNOSIS — M533 Sacrococcygeal disorders, not elsewhere classified: Secondary | ICD-10-CM | POA: Insufficient documentation

## 2017-05-27 DIAGNOSIS — E039 Hypothyroidism, unspecified: Secondary | ICD-10-CM | POA: Insufficient documentation

## 2017-05-27 DIAGNOSIS — M62830 Muscle spasm of back: Secondary | ICD-10-CM | POA: Insufficient documentation

## 2017-05-27 DIAGNOSIS — Z886 Allergy status to analgesic agent status: Secondary | ICD-10-CM | POA: Diagnosis not present

## 2017-05-27 DIAGNOSIS — Z888 Allergy status to other drugs, medicaments and biological substances status: Secondary | ICD-10-CM | POA: Insufficient documentation

## 2017-05-27 NOTE — Patient Instructions (Addendum)
  BMI Assessment: Estimated body mass index is 40.44 kg/m as calculated from the following:   Height as of this encounter: 5\' 5"  (1.651 m).   Weight as of this encounter: 243 lb (110.2 kg).  BMI interpretation table: BMI level Category Range association with higher incidence of chronic pain  <18 kg/m2 Underweight   18.5-24.9 kg/m2 Ideal body weight   25-29.9 kg/m2 Overweight Increased incidence by 20%  30-34.9 kg/m2 Obese (Class I) Increased incidence by 68%  35-39.9 kg/m2 Severe obesity (Class II) Increased incidence by 136%  >40 kg/m2 Extreme obesity (Class III) Increased incidence by 254%   BMI Readings from Last 4 Encounters:  05/27/17 40.44 kg/m  04/24/17 41.27 kg/m  04/15/17 41.27 kg/m  02/05/17 43.27 kg/m   Wt Readings from Last 4 Encounters:  05/27/17 243 lb (110.2 kg)  04/24/17 248 lb (112.5 kg)  04/15/17 248 lb (112.5 kg)  02/05/17 260 lb (117.9 kg)  Preparing for Procedure with Sedation Instructions: . Oral Intake: Do not eat or drink anything for at least 8 hours prior to your procedure. . Transportation: Public transportation is not allowed. Bring an adult driver. The driver must be physically present in our waiting room before any procedure can be started. Marland Kitchen Physical Assistance: Bring an adult capable of physically assisting you, in the event you need help. . Blood Pressure Medicine: Take your blood pressure medicine with a sip of water the morning of the procedure. . Insulin: Take only  of your normal insulin dose. . Preventing infections: Shower with an antibacterial soap the morning of your procedure. . Build-up your immune system: Take 1000 mg of Vitamin C with every meal (3 times a day) the day prior to your procedure. . Pregnancy: If you are pregnant, call and cancel the procedure. . Sickness: If you have a cold, fever, or any active infections, call and cancel the procedure. . Arrival: You must be in the facility at least 30 minutes prior to your  scheduled procedure. . Children: Do not bring children with you. . Dress appropriately: Bring dark clothing that you would not mind if they get stained. . Valuables: Do not bring any jewelry or valuables. Procedure appointments are reserved for interventional treatments only. Marland Kitchen No Prescription Refills. . No medication changes will be discussed during procedure appointments. . No disability issues will be discussed.

## 2017-05-27 NOTE — Progress Notes (Signed)
Patient's Name: Danielle [name]  MRN: [id number]  Referring Provider: Marygrace Drought, MD  DOB: 1961-05-05  PCP: Marygrace Drought, MD  DOS: 05/27/2017  Note by: Vevelyn Francois NP  Service setting: Ambulatory outpatient  Specialty: Interventional Pain Management  Location: [facility] (AMB) Pain Management Facility    Patient type: Established    Primary Reason(s) for Visit: Encounter for post-procedure evaluation of chronic illness with mild to moderate exacerbation CC: Back Pain (low right)  HPI  Ms. Tyminski is a 56 y.o. year old, female patient, who comes today for a post-procedure evaluation and medication management. 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); Chronic low back pain (Location of Tertiary source of pain) (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); Lumbar facet syndrome (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); Lumbar spondylosis; Failed back surgical syndrome; Chronic lower extremity pain (Right); Chronic neck pain (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); Chronic cervical radicular pain (Right); Ulnar neuropathy of left upper extremity; Hx of cervical spine surgery; Cervical spondylosis; Encounter for therapeutic drug level monitoring; Encounter for pain management planning; Fibromyalgia; Chronic sacroiliac pain (B) (R>L); Seasonal allergic rhinitis; Status post total replacement of right shoulder; Depression; Hypertension; Hypothyroid; Chronic pain syndrome; Complaints of weakness of  lower extremity; Morbid obesity with BMI of 40.0-44.9, adult ([place]); Anterolisthesis; Cervical facet syndrome ([place]); Chronic musculoskeletal pain; Muscle spasm of back; Encounter for screening colonoscopy; Rectal bleeding; Mixed simple and mucopurulent chronic bronchitis ([place]); Acute postoperative pain; Nausea; Vasovagal episode; and Myofascial pain on their problem list. Her primarily concern today is the Back Pain (low right)  Pain Assessment: Location: Lower, Right Back Radiating: radiates into right hip and groin Onset: More than a month ago Duration:   Quality: Aching, Sore, Sharp Severity: 3 /10 (self-reported pain score)  Note: Reported level is compatible with observation.                          Effect on ADL: makes it hard to stand.   Timing: Intermittent Modifying factors: medications  Ms. Einspahr was last seen on 04/24/2017 for a procedure. During today's appointment we reviewed Ms. Lupinski's post-procedure results, as well as her outpatient medication regimen. She states that she is having some relief now from the left sided RFA. However she is having right sided lower back pain. She states that it feels like she has a knot in her lower back. She denies any radiating pain. She states that she has tried painting to help her distress. She states that her mom is being treated for breast cancer.   Further details on both, my assessment(s), as well as the proposed treatment plan, please see below.  Post-Procedure Assessment  04/24/2017 Procedure: Left RFA Pre-procedure pain score:  4/10 Post-procedure pain score: 5/10         Influential Factors: BMI: 40.44 kg/m Intra-procedural challenges: None observed.         Assessment challenges: None detected.              Reported side-effects: None.        Post-procedural adverse reactions or complications: None reported  Sedation: Please see nurses note. When no sedatives are used, the analgesic levels obtained are directly  associated to the effectiveness of the local anesthetics. However, when sedation is provided, the level of analgesia obtained during the initial 1 hour following the intervention, is believed to be the result of a combination of factors. These factors may include, but are not limited to: 1. The effectiveness of the local anesthetics used. 2. The effects of the analgesic(s) and/or anxiolytic(s) used. 3. The degree of discomfort experienced by the patient at the time of the procedure. 4. The patients ability and reliability in recalling and recording the events. 5. The presence and influence of possible secondary gains and/or psychosocial factors. Reported result: Relief experienced during the 1st hour after the procedure: 0 % (Ultra-Short Term Relief)            Interpretative annotation: Clinically appropriate result. Analgesia during this period is likely to be Local Anesthetic and/or IV Sedative (Analgesic/Anxiolytic) related.          Effects of local anesthetic: The analgesic effects attained during this period are directly associated to the localized infiltration of local anesthetics and therefore cary significant diagnostic value as to the etiological location, or anatomical origin, of the pain. Expected duration of relief is directly dependent on the pharmacodynamics of the local anesthetic used. Long-acting (4-6 hours) anesthetics used.  Reported result: Relief during the next 4 to 6 hour after the procedure: 0 % (Short-Term Relief)            Interpretative annotation: Clinically appropriate result. Analgesia during this period is likely to be Local Anesthetic-related.          Long-term benefit: Defined as the period of time past the expected duration of local anesthetics (1 hour for short-acting and 4-6 hours for long-acting). With the possible exception of prolonged sympathetic blockade from the local anesthetics, benefits during this period are typically attributed to, or associated with,  other factors such as analgesic sensory neuropraxia, antiinflammatory effects, or beneficial biochemical changes provided by agents other than the local anesthetics.  Reported result: Extended relief following procedure: 50 % (Long-Term Relief)            Interpretative annotation: Clinically appropriate result. Good relief. No permanent benefit expected. Inflammation plays a part in the etiology to the pain.          Current benefits: Defined as reported results that persistent at this point in time.   Analgesia: >50 % Ms. Heard reports improvement of axial symptoms. Function: Somewhat improved ROM: Somewhat improved Interpretative annotation: Recurrence of symptoms. Therapeutic benefit observed. Adequate RF ablation.          Interpretation: Results would suggest adequate radiofrequency ablation.                  Plan:  Please see "Plan of Care" for details.        Laboratory Chemistry  Inflammation Markers (CRP: Acute Phase) (ESR: Chronic Phase) Lab Results  Component Value Date   CRP 0.9 01/08/2016   ESRSEDRATE 37 (H) 01/08/2016                 Renal Function Markers Lab Results  Component Value Date   BUN 8 01/08/2016   CREATININE 0.94 01/08/2016   GFRAA >60 01/08/2016   GFRNONAA >60 01/08/2016                 Hepatic Function Markers Lab Results  Component Value Date   AST 18  01/08/2016   ALT 14 01/08/2016   ALBUMIN 3.7 01/08/2016   ALKPHOS 79 01/08/2016                 Electrolytes Lab Results  Component Value Date   NA 137 01/08/2016   K 3.5 01/08/2016   CL 101 01/08/2016   CALCIUM 9.0 01/08/2016   MG 2.1 01/08/2016                 Neuropathy Markers Lab Results  Component Value Date   VITAMINB12 >7,500 (H) 01/08/2016                 Bone Pathology Markers Lab Results  Component Value Date   ALKPHOS 79 01/08/2016   25OHVITD1 32 01/08/2016   25OHVITD2 2.0 01/08/2016   25OHVITD3 30 01/08/2016   CALCIUM 9.0 01/08/2016                  Rheumatology Markers No results found for: LABURIC              Coagulation Parameters No results found for: INR, LABPROT, APTT, PLT, DDIMER               Cardiovascular Markers No results found for: BNP, CKTOTAL, CKMB, TROPONINI, HGB, HCT               CA Markers No results found for: CEA, CA125, LABCA2               Note: Lab results reviewed.  Recent Diagnostic Imaging Results  DG C-Arm 1-60 Min-No Report Fluoroscopy was utilized by the requesting physician.  No radiographic  interpretation.   Complexity Note: Imaging results reviewed. Results shared with Ms. Domingo Cocking, using Layman's terms.                         Meds   Current Outpatient Medications:  .  albuterol (PROVENTIL HFA;VENTOLIN HFA) 108 (90 Base) MCG/ACT inhaler, Inhale 2 puffs into the lungs every 4 (four) hours as needed for wheezing or shortness of breath. , Disp: , Rfl:  .  Biotin 10 MG CAPS, Take 1 tablet by mouth daily., Disp: , Rfl:  .  Cholecalciferol (VITAMIN D3) 2000 units capsule, Take 2,000 Units by mouth daily. , Disp: , Rfl:  .  citalopram (CELEXA) 40 MG tablet, Take 40 mg by mouth daily., Disp: , Rfl: 3 .  cyanocobalamin (,VITAMIN B-12,) 1000 MCG/ML injection, Inject into the muscle., Disp: , Rfl:  .  cyclobenzaprine (FLEXERIL) 10 MG tablet, Take 1 tablet (10 mg total) by mouth 3 (three) times daily as needed for muscle spasms., Disp: 90 tablet, Rfl: 2 .  diclofenac sodium (VOLTAREN) 1 % GEL, Apply 2 g topically 4 (four) times daily., Disp: 100 g, Rfl: 2 .  docusate sodium (COLACE) 100 MG capsule, Take 100 mg by mouth 2 (two) times daily., Disp: , Rfl:  .  levothyroxine (SYNTHROID, LEVOTHROID) 50 MCG tablet, TAKE 1 TABLET (50 MCG TOTAL) BY MOUTH DAILY AT 0600., Disp: , Rfl: 11 .  loratadine (CLARITIN) 10 MG tablet, TAKE 1 TABLET (10 MG TOTAL) BY MOUTH DAILY., Disp: , Rfl: 11 .  magnesium hydroxide (MILK OF MAGNESIA) 400 MG/5ML suspension, Take by mouth., Disp: , Rfl:  .  montelukast (SINGULAIR) 10  MG tablet, TAKE 1 TABLET (10 MG TOTAL) BY MOUTH NIGHTLY., Disp: , Rfl: 3 .  naloxone (NARCAN) 2 MG/2ML injection, Inject content of syringe into thigh muscle. Call 911., Disp: 2  Syringe, Rfl: 1 .  nystatin (MYCOSTATIN/NYSTOP) powder, Apply topically., Disp: , Rfl:  .  Oxycodone HCl 10 MG TABS, Take 1 tablet (10 mg total) by mouth every 6 (six) hours as needed., Disp: 120 tablet, Rfl: 0 .  [START ON 06/25/2017] Oxycodone HCl 10 MG TABS, Take 1 tablet (10 mg total) by mouth every 6 (six) hours as needed., Disp: 120 tablet, Rfl: 0 .  SPIRIVA HANDIHALER 18 MCG inhalation capsule, PLACE 1 CAPSULE (18 MCG TOTAL) INTO INHALER AND INHALE ONCE DAILY., Disp: , Rfl: 5 .  cetirizine (ZYRTEC) 10 MG tablet, Take 10 mg by mouth daily. , Disp: , Rfl:  .  Oxycodone HCl 10 MG TABS, Take 1 tablet (10 mg total) by mouth every 6 (six) hours as needed., Disp: 120 tablet, Rfl: 0  ROS  Constitutional: Denies any fever or chills Gastrointestinal: No reported hemesis, hematochezia, vomiting, or acute GI distress Musculoskeletal: Denies any acute onset joint swelling, redness, loss of ROM, or weakness Neurological: No reported episodes of acute onset apraxia, aphasia, dysarthria, agnosia, amnesia, paralysis, loss of coordination, or loss of consciousness  Allergies  Ms. Nesbitt is allergic to aripiprazole; duloxetine; gabapentin; gold; iodinated diagnostic agents; naproxen sodium; nortriptyline hcl; nsaids; pregabalin; ace inhibitors; aspirin; cefpodoxime; fluoxetine; fluticasone-salmeterol; levofloxacin; lithium; meperidine; paroxetine; tape; telithromycin; theophylline; topiramate; trazodone; triamcinolone; venlafaxine; bupropion; ketorolac tromethamine; morphine; and moxifloxacin.  [place]  Drug: Ms. Stark  reports that she does not use drugs. Alcohol:  reports that she does not drink alcohol. Tobacco:  reports that she has been smoking cigarettes.  She has a 40.00 pack-year smoking history. she has never used smokeless  tobacco. Medical:  has a past medical history of Anxiety, Arthritis, Asthma, Bell's palsy, Bursitis, COPD (chronic obstructive pulmonary disease) ([place]), Depression, Fibromyalgia, Heart murmur, Hepatitis C, Hiatal hernia, Hyperlipidemia, Hypertension, IBS (irritable bowel syndrome), Insomnia, and Thyroid disease. Surgical: Ms. Mihelich  has a past surgical history that includes necksurgery (10/21/2014); Back surgery (2013); Foot surgery (Right); Elbow surgery (Left); Carpal tunnel release (Right); Nose surgery; Partial hysterectomy; Total shoulder replacement (Bilateral); and COLONOSCOPY WITH PROPOFOL (N/A, 02/05/2017). Family: family history includes Cancer in her mother; Gout in her mother; Heart disease in her father.  Constitutional Exam  General appearance: Well nourished, well developed, and well hydrated. In no apparent acute distress Vitals:   05/27/17 1103  BP: (!) 149/100  Pulse: 76  Resp: 16  Temp: 98.8 F (37.1 C)  SpO2: 99%  Weight: 243 lb (110.2 kg)  Height: _0  (1.651 m)  Psych/Mental status: Alert, oriented x 3 (person, place, & time)       Eyes: PERLA Respiratory: No evidence of acute respiratory distress  Lumbar Spine Area Exam  Skin & Axial Inspection: No masses, redness, or swelling Alignment: Symmetrical Functional ROM: Unrestricted ROM      Stability: No instability detected Muscle Tone/Strength: Increased muscle tone over affected area Sensory (Neurological): Unimpaired Palpation: Complains of area being tender to palpation       Provocative Tests: Lumbar Hyperextension and rotation test: Positive bilaterally for facet joint pain. Lumbar Lateral bending test: Positive       Patrick's Maneuver: Unable to perform             for right hip arthralgia  Gait & Posture Assessment  Ambulation: Unassisted Gait: Relatively normal for age and body habitus Posture: WNL   Lower Extremity Exam    Side: Right lower extremity  Side: Left lower extremity  Skin &  Extremity Inspection: Skin color, temperature, and  hair growth are WNL. No peripheral edema or cyanosis. No masses, redness, swelling, asymmetry, or associated skin lesions. No contractures.  Skin & Extremity Inspection: Skin color, temperature, and hair growth are WNL. No peripheral edema or cyanosis. No masses, redness, swelling, asymmetry, or associated skin lesions. No contractures.  Functional ROM: Unrestricted ROM          Functional ROM: Unrestricted ROM          Muscle Tone/Strength: Functionally intact. No obvious neuro-muscular anomalies detected.  Muscle Tone/Strength: Functionally intact. No obvious neuro-muscular anomalies detected.  Sensory (Neurological): Unimpaired  Sensory (Neurological): Unimpaired  Palpation: No palpable anomalies  Palpation: No palpable anomalies   Assessment  Primary Diagnosis & Pertinent Problem List: The primary encounter diagnosis was Chronic pain of both hips. Diagnoses of Myofascial pain, Chronic low back pain (Location of Tertiary source of pain) (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), Chronic sacroiliac pain (B) (R>L), Chronic musculoskeletal pain, Muscle spasm of back, Chronic pain syndrome, and Lumbar facet syndrome (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) were also pertinent to this visit.  Status Diagnosis  Worsening Persistent Controlled 1. Chronic pain of both hips   2. Myofascial pain   3. Chronic low back pain (Location of Tertiary source of pain) (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)   4. Chronic sacroiliac pain (B) (R>L)   5. Chronic musculoskeletal pain   6. Muscle spasm of back   7. Chronic pain syndrome   8. Lumbar facet syndrome (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)     Problems updated and reviewed during this visit: Problem  Myofascial Pain   Plan of Care  Pharmacotherapy (Medications Ordered): No orders of the defined types were placed in this encounter. This SmartLink is deprecated. Use AVSMEDLIST instead to display the medication list for a patient. Medications administered today: Marne A. Domingo Cocking  "ANGIE" had no medications administered during this visit. Lab-work, procedure(s), and/or referral(s): Orders Placed This Encounter  Procedures  . HIP INJECTION  . TRIGGER POINT INJECTION   Imaging and/or referral(s): None  Interventional therapies: Planned, scheduled, and/or pending:  Right lower back Trigger point injection  Right Hip injection   Considering:  Palliative bilateralsuprascapular nerve block Possible bilateral suprascapular nerve radiofrequencyablation Diagnostic bilateral intra-articular hip joint injection Possible bilateral hip joint radiofrequencyablation Diagnostic bilateral lumbar facet block Possible bilaterallumbar facet radiofrequencyablation Diagnostic caudal epiduralsteroid injection + diagnostic epidurogram Possible Racz procedure. Diagnostic bilateral cervical facet block Possible bilateral cervical facet radiofrequencyablation Diagnostic right-sided cervical epidural steroid injection Diagnostic bilateral sacroiliac joint block Possible bilateral sacroiliac joint RFA   Palliative PRN treatment(s):  Palliative bilateralsuprascapular nerve block Diagnostic bilateral intra-articular hip joint injection Diagnostic bilateral lumbar facet block Diagnostic caudal epiduralsteroid injection + diagnostic epidurogram Diagnostic bilateral cervical facet block Diagnostic right-sided cervical epidural steroid injection Diagnostic bilateral sacroiliac joint block   Provider-requested follow-up: Return for Procedure(NS), w/ Dr. [name], ([facility]).  Future Appointments  Date Time Provider Kay  06/03/2017  8:30 AM Milinda Pointer, MD [facility] None  07/24/2017  2:30 PM Vevelyn Francois, NP [facility] None   Primary Care Physician: Marygrace Drought, MD Location: [facility] Outpatient Pain Management Facility Note by: Vevelyn Francois NP Date: 05/27/2017; Time: 3:44 PM  Pain Score Disclaimer: We use the NRS-11 scale. This is a  self-reported, subjective measurement of pain severity with only modest accuracy. It is used primarily to identify changes within a particular patient. It must be understood that outpatient pain scales are significantly less accurate that those used for research, where they can be applied under ideal controlled circumstances with minimal exposure to variables. In reality, the score  is likely to be a combination of pain intensity and pain affect, where pain affect describes the degree of emotional arousal or changes in action readiness caused by the sensory experience of pain. Factors such as social and work situation, setting, emotional state, anxiety levels, expectation, and prior pain experience may influence pain perception and show large inter-individual differences that may also be affected by time variables.  Patient instructions provided during this appointment: Patient Instructions    BMI Assessment: Estimated body mass index is 40.44 kg/m as calculated from the following:   Height as of this encounter: _0  (1.651 m).   Weight as of this encounter: 243 lb (110.2 kg).  BMI interpretation table: BMI level Category Range association with higher incidence of chronic pain  <18 kg/m2 Underweight   18.5-24.9 kg/m2 Ideal body weight   25-29.9 kg/m2 Overweight Increased incidence by 20%  30-34.9 kg/m2 Obese (Class I) Increased incidence by 68%  35-39.9 kg/m2 Severe obesity (Class II) Increased incidence by 136%  >40 kg/m2 Extreme obesity (Class III) Increased incidence by 254%   BMI Readings from Last 4 Encounters:  05/27/17 40.44 kg/m  04/24/17 41.27 kg/m  04/15/17 41.27 kg/m  02/05/17 43.27 kg/m   Wt Readings from Last 4 Encounters:  05/27/17 243 lb (110.2 kg)  04/24/17 248 lb (112.5 kg)  04/15/17 248 lb (112.5 kg)  02/05/17 260 lb (117.9 kg)  Preparing for Procedure with Sedation Instructions: . Oral Intake: Do not eat or drink anything for at least 8 hours prior to your  procedure. . Transportation: Public transportation is not allowed. Bring an adult driver. The driver must be physically present in our waiting room before any procedure can be started. Marland Kitchen Physical Assistance: Bring an adult capable of physically assisting you, in the event you need help. . Blood Pressure Medicine: Take your blood pressure medicine with a sip of water the morning of the procedure. . Insulin: Take only  of your normal insulin dose. . Preventing infections: Shower with an antibacterial soap the morning of your procedure. . Build-up your immune system: Take 1000 mg of Vitamin C with every meal (3 times a day) the day prior to your procedure. . Pregnancy: If you are pregnant, call and cancel the procedure. . Sickness: If you have a cold, fever, or any active infections, call and cancel the procedure. . Arrival: You must be in the facility at least 30 minutes prior to your scheduled procedure. . Children: Do not bring children with you. . Dress appropriately: Bring dark clothing that you would not mind if they get stained. . Valuables: Do not bring any jewelry or valuables. Procedure appointments are reserved for interventional treatments only. Marland Kitchen No Prescription Refills. . No medication changes will be discussed during procedure appointments. . No disability issues will be discussed.

## 2017-06-03 ENCOUNTER — Ambulatory Visit: Payer: Medicaid Other | Admitting: Pain Medicine

## 2017-06-11 DIAGNOSIS — G8929 Other chronic pain: Secondary | ICD-10-CM | POA: Insufficient documentation

## 2017-06-11 DIAGNOSIS — M549 Dorsalgia, unspecified: Secondary | ICD-10-CM | POA: Insufficient documentation

## 2017-06-11 DIAGNOSIS — M160 Bilateral primary osteoarthritis of hip: Secondary | ICD-10-CM | POA: Insufficient documentation

## 2017-06-11 DIAGNOSIS — M25559 Pain in unspecified hip: Secondary | ICD-10-CM | POA: Insufficient documentation

## 2017-06-11 DIAGNOSIS — M25551 Pain in right hip: Principal | ICD-10-CM

## 2017-06-11 DIAGNOSIS — M25552 Pain in left hip: Principal | ICD-10-CM

## 2017-06-11 NOTE — Progress Notes (Signed)
Patient's Name: Danielle [name]  MRN: [id number]  Referring Provider: Vevelyn Francois, NP  DOB: 1960/09/22  PCP: Danielle Drought, MD  DOS: 06/12/2017  Note by: Gaspar Cola, MD  Service setting: Ambulatory outpatient  Specialty: Interventional Pain Management  Patient type: Established  Location: [facility] (AMB) Pain Management Facility  Visit type: Interventional Procedure   Primary Reason for Visit: Interventional Pain Management Treatment. CC: Back Pain (right lower) and Hip Pain (right)  Procedure #1:  Anesthesia, Analgesia, Anxiolysis:  Type: Diagnostic Sacroiliac Joint Steroid Injection Region: Superior Lumbosacral Region Level: PSIS (Posterior Superior Iliac Spine) Laterality: Right-Sided  Type: Local Anesthesia with Moderate (Conscious) Sedation Local Anesthetic: Lidocaine 1% Route: Intravenous (IV) IV Access: Secured Sedation: Meaningful verbal contact was maintained at all times during the procedure  Indication(s): Analgesia and Anxiety   Procedure #2:  Anesthesia, Analgesia, Anxiolysis:  Type: Therapeutic Intra-Articular Hip Injection Region:  Posterolateral hip joint area. Level: Lower pelvic and hip joint level. Laterality: Right-Sided  Type: Local Anesthesia with Moderate (Conscious) Sedation Local Anesthetic: Lidocaine 1% Route: Intravenous (IV) IV Access: Secured Sedation: Meaningful verbal contact was maintained at all times during the procedure  Indication(s): Analgesia and Anxiety   Indications: 1. Chronic pain of both hips   2. 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)   3. Chronic sacroiliac pain (B) (R>L)   4. Chronic low back pain (Location of Tertiary source of pain) (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)   5. Primary osteoarthritis of hips, bilateral   6. Chronic sacroiliac pain   7. Chronic bilateral low back pain with right-sided sciatica    Pain Score: Pre-procedure: 3 /10 Post-procedure: 5 /10  Pre-op Assessment:  [name] is a 56 y.o. (year old), female  patient, seen today for interventional treatment. She  has a past surgical history that includes necksurgery (10/21/2014); Back surgery (2013); Foot surgery (Right); Elbow surgery (Left); Carpal tunnel release (Right); Nose surgery; Partial hysterectomy; Total shoulder replacement (Bilateral); and Colonoscopy with propofol (N/A, 02/05/2017). [name] has a current medication list which includes the following prescription(s): albuterol, biotin, vitamin d3, citalopram, cyanocobalamin, cyclobenzaprine, diclofenac sodium, docusate sodium, levothyroxine, loratadine, magnesium hydroxide, montelukast, naloxone, nystatin, oxycodone hcl, oxycodone hcl, spiriva handihaler, cetirizine, and oxycodone hcl, and the following Facility-Administered Medications: fentanyl, iopamidol, lactated ringers, and midazolam. Her primarily concern today is the Back Pain (right lower) and Hip Pain (right)  Initial Vital Signs: Blood pressure 131/84, pulse 78, temperature 98.1 F (36.7 C), temperature source Oral, resp. rate 16, height 5\' 5"  (1.651 m), weight 243 lb (110.2 kg), SpO2 98 %. BMI: Estimated body mass index is 40.44 kg/m as calculated from the following:   Height as of this encounter: 5\' 5"  (1.651 m).   Weight as of this encounter: 243 lb (110.2 kg).  Risk Assessment: Allergies: Reviewed. She is allergic to aripiprazole; duloxetine; gabapentin; gold; iodinated diagnostic agents; naproxen sodium; nortriptyline hcl; nsaids; pregabalin; ace inhibitors; aspirin; cefpodoxime; fluoxetine; fluticasone-salmeterol; levofloxacin; lithium; meperidine; paroxetine; tape; telithromycin; theophylline; topiramate; trazodone; triamcinolone; venlafaxine; bupropion; ketorolac tromethamine; morphine; and moxifloxacin.  Allergy Precautions: No radiological contrast used Coagulopathies: Reviewed. None identified.  Blood-thinner therapy: None at this time Active Infection(s): Reviewed. None identified. [name] is afebrile  Site  Confirmation: [name] was asked to confirm the procedure and laterality before marking the site Procedure checklist: Completed Consent: Before the procedure and under the influence of no sedative(s), amnesic(s), or anxiolytics, the patient was informed of the treatment options, risks and possible complications. To fulfill our ethical and legal obligations, as recommended by the  American Medical Association's Code of Ethics, I have informed the patient of my clinical impression; the nature and purpose of the treatment or procedure; the risks, benefits, and possible complications of the intervention; the alternatives, including doing nothing; the risk(s) and benefit(s) of the alternative treatment(s) or procedure(s); and the risk(s) and benefit(s) of doing nothing. The patient was provided information about the general risks and possible complications associated with the procedure. These may include, but are not limited to: failure to achieve desired goals, infection, bleeding, organ or nerve damage, allergic reactions, paralysis, and death. In addition, the patient was informed of those risks and complications associated to the procedure, such as failure to decrease pain; infection; bleeding; organ or nerve damage with subsequent damage to sensory, motor, and/or autonomic systems, resulting in permanent pain, numbness, and/or weakness of one or several areas of the body; allergic reactions; (i.e.: anaphylactic reaction); and/or death. Furthermore, the patient was informed of those risks and complications associated with the medications. These include, but are not limited to: allergic reactions (i.e.: anaphylactic or anaphylactoid reaction(s)); adrenal axis suppression; blood sugar elevation that in diabetics may result in ketoacidosis or comma; water retention that in patients with history of congestive heart failure may result in shortness of breath, pulmonary edema, and decompensation with resultant heart  failure; weight gain; swelling or edema; medication-induced neural toxicity; particulate matter embolism and blood vessel occlusion with resultant organ, and/or nervous system infarction; and/or aseptic necrosis of one or more joints. Finally, the patient was informed that Medicine is not an exact science; therefore, there is also the possibility of unforeseen or unpredictable risks and/or possible complications that may result in a catastrophic outcome. The patient indicated having understood very clearly. We have given the patient no guarantees and we have made no promises. Enough time was given to the patient to ask questions, all of which were answered to the patient's satisfaction. Ms. Wimberley has indicated that she wanted to continue with the procedure. Attestation: I, the ordering provider, attest that I have discussed with the patient the benefits, risks, side-effects, alternatives, likelihood of achieving goals, and potential problems during recovery for the procedure that I have provided informed consent. Date: 06/12/2017; Time: 1:30 PM  Pre-Procedure Preparation:  Monitoring: As per clinic protocol. Respiration, ETCO2, SpO2, BP, heart rate and rhythm monitor placed and checked for adequate function Safety Precautions: Patient was assessed for positional comfort and pressure points before starting the procedure. Time-out: I initiated and conducted the "Time-out" before starting the procedure, as per protocol. The patient was asked to participate by confirming the accuracy of the "Time Out" information. Verification of the correct person, site, and procedure were performed and confirmed by me, the nursing staff, and the patient. "Time-out" conducted as per Joint Commission's Universal Protocol (UP.01.01.01). "Time-out" Date & Time: 06/12/2017; 1355 hrs.  Description of Procedure #1 Process:  Position: Prone Target Area: Superior, posterior, aspect of the sacroiliac fissure Approach: Posterior,  paraspinal, ipsilateral approach. Area Prepped: Entire Lower Lumbosacral Region Prepping solution: ChloraPrep (2% chlorhexidine gluconate and 70% isopropyl alcohol) Safety Precautions: Aspiration looking for blood return was conducted prior to all injections. At no point did we inject any substances, as a needle was being advanced. No attempts were made at seeking any paresthesias. Safe injection practices and needle disposal techniques used. Medications properly checked for expiration dates. SDV (single dose vial) medications used. Description of the Procedure: Protocol guidelines were followed. The patient was placed in position over the procedure table. The target area was identified  and the area prepped in the usual manner. Skin & deeper tissues infiltrated with local anesthetic. Appropriate amount of time allowed to pass for local anesthetics to take effect. The procedure needle was advanced under fluoroscopic guidance into the sacroiliac joint until a firm endpoint was obtained. Proper needle placement secured. Negative aspiration confirmed. Solution injected in intermittent fashion, asking for systemic symptoms every 0.5cc of injectate. The needles were then removed and the area cleansed, making sure to leave some of the prepping solution back to take advantage of its long term bactericidal properties. Vitals:   06/12/17 1414 06/12/17 1422 06/12/17 1433 06/12/17 1442  BP: (!) 153/94 (!) 150/81 136/84 (!) 132/91  Pulse:      Resp: 13 11 13 15   Temp:      TempSrc:      SpO2: 95% 98% 99% 99%  Weight:      Height:        Start Time: 1355 hrs. Materials:  Needle(s) Type: Regular needle Gauge: 22G Length: 7-in Medication(s): We administered midazolam, fentaNYL, lidocaine, ropivacaine (PF) 2 mg/mL (0.2%), methylPREDNISolone acetate, methylPREDNISolone acetate, and ropivacaine (PF) 2 mg/mL (0.2%). Please see chart orders for dosing details.  Description of Procedure #2 Process:   Position:  Prone Target Area: Superior aspect of the hip joint cavity, going thru the superior portion of the capsular ligament. Approach: Posterolateral approach. Area Prepped: Entire Posterolateral hip area. Prepping solution: ChloraPrep (2% chlorhexidine gluconate and 70% isopropyl alcohol) Safety Precautions: Aspiration looking for blood return was conducted prior to all injections. At no point did we inject any substances, as a needle was being advanced. No attempts were made at seeking any paresthesias. Safe injection practices and needle disposal techniques used. Medications properly checked for expiration dates. SDV (single dose vial) medications used. Description of the Procedure: Protocol guidelines were followed. The patient was placed in position over the fluoroscopy table. The target area was identified and the area prepped in the usual manner. Skin & deeper tissues infiltrated with local anesthetic. Appropriate amount of time allowed to pass for local anesthetics to take effect. The procedure needles were then advanced to the target area. Proper needle placement secured. Negative aspiration confirmed. Solution injected in intermittent fashion, asking for systemic symptoms every 0.5cc of injectate. The needles were then removed and the area cleansed, making sure to leave some of the prepping solution back to take advantage of its long term bactericidal properties.  End Time: 1413 hrs. Materials:  Needle(s) Type: Regular needle Gauge: 22G Length: 7-in Medication(s): We administered midazolam, fentaNYL, lidocaine, ropivacaine (PF) 2 mg/mL (0.2%), methylPREDNISolone acetate, methylPREDNISolone acetate, and ropivacaine (PF) 2 mg/mL (0.2%). Please see chart orders for dosing details.  Imaging Guidance (Non-Spinal):  Type of Imaging Technique: Fluoroscopy Guidance (Non-Spinal) Indication(s): Assistance in needle guidance and placement for procedures requiring needle placement in or near specific  anatomical locations not easily accessible without such assistance. Exposure Time: Please see nurses notes. Contrast: None used. Fluoroscopic Guidance: I was personally present during the use of fluoroscopy. "Tunnel Vision Technique" used to obtain the best possible view of the target area. Parallax error corrected before commencing the procedure. "Direction-depth-direction" technique used to introduce the needle under continuous pulsed fluoroscopy. Once target was reached, antero-posterior, oblique, and lateral fluoroscopic projection used confirm needle placement in all planes. Images permanently stored in EMR. Interpretation: No contrast injected.  Antibiotic Prophylaxis:  Indication(s): None identified Antibiotic given: None  Post-operative Assessment:  EBL: None Complications: No immediate postoperative complication but the patient did experience an  exacerbation of the groin pain, especially after the SI joint suggesting the possibility that the anterior SI joint capsule may be fractured with leakage of injectate into that area. Note: The patient tolerated the entire procedure well. A repeat set of vitals were taken after the procedure and the patient was kept under observation following institutional policy, for this type of procedure. Post-procedural neurological assessment was performed, showing return to baseline, prior to discharge. The patient was provided with post-procedure discharge instructions, including a section on how to identify potential problems. Should any problems arise concerning this procedure, the patient was given instructions to immediately contact us, at any time, without hesitation. In any case, we plan to contact the patient by telephone for a follow-up status report regarding this interventional procedure. Comments:  No additional relevant information.  Plan of Care   Imaging Orders     DG C-Arm 1-60 Min-No Report  Procedure Orders     SACROILIAC JOINT  INJECTION  Medications ordered for procedure: Meds ordered this encounter  Medications  . lactated ringers infusion 1,000 mL  . midazolam (VERSED) 5 MG/5ML injection 1-2 mg    Make sure Flumazenil is available in the pyxis when using this medication. If oversedation occurs, administer 0.2 mg IV over 15 sec. If after 45 sec no response, administer 0.2 mg again over 1 min; may repeat at 1 min intervals; not to exceed 4 doses (1 mg)  . fentaNYL (SUBLIMAZE) injection 25-50 mcg    Make sure Narcan is available in the pyxis when using this medication. In the event of respiratory depression (RR< 8/min): Titrate NARCAN (naloxone) in increments of 0.1 to 0.2 mg IV at 2-3 minute intervals, until desired degree of reversal.  . lidocaine (XYLOCAINE) 2 % (with pres) injection 400 mg  . ropivacaine (PF) 2 mg/mL (0.2%) (NAROPIN) injection 9 mL  . methylPREDNISolone acetate (DEPO-MEDROL) injection 80 mg  . iopamidol (ISOVUE-M) 41 % intrathecal injection 10 mL  . methylPREDNISolone acetate (DEPO-MEDROL) injection 80 mg  . ropivacaine (PF) 2 mg/mL (0.2%) (NAROPIN) injection 4 mL   Medications administered: We administered midazolam, fentaNYL, lidocaine, ropivacaine (PF) 2 mg/mL (0.2%), methylPREDNISolone acetate, methylPREDNISolone acetate, and ropivacaine (PF) 2 mg/mL (0.2%).  See the medical record for exact dosing, route, and time of administration.  This SmartLink is deprecated. Use AVSMEDLIST instead to display the medication list for a patient. Disposition: Discharge home  Discharge Date & Time: 06/12/2017; 1454 hrs.   Physician-requested Follow-up: Return for post-procedure eval (2 wks)(w/ Dionisio David, NP). Future Appointments  Date Time Provider [place]  06/26/2017  9:00 AM Danielle Francois, NP [facility] None  07/24/2017  2:30 PM Danielle Francois, NP [facility] None   Primary Care Physician: Danielle Drought, MD Location: [facility] Note by: Gaspar Cola, MD Date: 06/12/2017; Time: 4:08 PM  Disclaimer:  Medicine is not an Chief Strategy Officer. The only guarantee in medicine is that nothing is guaranteed. It is important to note that the decision to proceed with this intervention was based on the information collected from the patient. The Data and conclusions were drawn from the patient's questionnaire, the interview, and the physical examination. Because the information was provided in large part by the patient, it cannot be guaranteed that it has not been purposely or unconsciously manipulated. Every effort has been made to obtain as much relevant data as possible for this evaluation. It is important to note that the conclusions that lead to this procedure are derived in large  part from the available data. Always take into account that the treatment will also be dependent on availability of resources and existing treatment guidelines, considered by other Pain Management Practitioners as being common knowledge and practice, at the time of the intervention. For Medico-Legal purposes, it is also important to point out that variation in procedural techniques and pharmacological choices are the acceptable norm. The indications, contraindications, technique, and results of the above procedure should only be interpreted and judged by a Board-Certified Interventional Pain Specialist with extensive familiarity and expertise in the same exact procedure and technique.

## 2017-06-12 ENCOUNTER — Ambulatory Visit
Admission: RE | Admit: 2017-06-12 | Discharge: 2017-06-12 | Disposition: A | Discharge status: Home / Self Care | Payer: Medicaid Other | Source: Ambulatory Visit | Attending: Pain Medicine | Admitting: Pain Medicine

## 2017-06-12 ENCOUNTER — Encounter: Payer: Self-pay | Admitting: Pain Medicine

## 2017-06-12 ENCOUNTER — Ambulatory Visit: Payer: Medicaid Other | Admitting: Pain Medicine

## 2017-06-12 VITALS — BP 132/91 | HR 78 | Temp 98.1°F | Resp 15 | Ht 65.0 in | Wt 243.0 lb

## 2017-06-12 DIAGNOSIS — Z79891 Long term (current) use of opiate analgesic: Secondary | ICD-10-CM | POA: Diagnosis not present

## 2017-06-12 DIAGNOSIS — F419 Anxiety disorder, unspecified: Secondary | ICD-10-CM | POA: Insufficient documentation

## 2017-06-12 DIAGNOSIS — Z79899 Other long term (current) drug therapy: Secondary | ICD-10-CM | POA: Insufficient documentation

## 2017-06-12 DIAGNOSIS — Z888 Allergy status to other drugs, medicaments and biological substances status: Secondary | ICD-10-CM | POA: Diagnosis not present

## 2017-06-12 DIAGNOSIS — Z96611 Presence of right artificial shoulder joint: Secondary | ICD-10-CM | POA: Diagnosis not present

## 2017-06-12 DIAGNOSIS — Z91041 Radiographic dye allergy status: Secondary | ICD-10-CM | POA: Diagnosis not present

## 2017-06-12 DIAGNOSIS — M160 Bilateral primary osteoarthritis of hip: Secondary | ICD-10-CM | POA: Insufficient documentation

## 2017-06-12 DIAGNOSIS — Z885 Allergy status to narcotic agent status: Secondary | ICD-10-CM | POA: Diagnosis not present

## 2017-06-12 DIAGNOSIS — M25552 Pain in left hip: Secondary | ICD-10-CM | POA: Insufficient documentation

## 2017-06-12 DIAGNOSIS — Z886 Allergy status to analgesic agent status: Secondary | ICD-10-CM | POA: Diagnosis not present

## 2017-06-12 DIAGNOSIS — Z79890 Hormone replacement therapy (postmenopausal): Secondary | ICD-10-CM | POA: Diagnosis not present

## 2017-06-12 DIAGNOSIS — G8929 Other chronic pain: Secondary | ICD-10-CM

## 2017-06-12 DIAGNOSIS — Z881 Allergy status to other antibiotic agents status: Secondary | ICD-10-CM | POA: Diagnosis not present

## 2017-06-12 DIAGNOSIS — M533 Sacrococcygeal disorders, not elsewhere classified: Secondary | ICD-10-CM | POA: Insufficient documentation

## 2017-06-12 DIAGNOSIS — M5441 Lumbago with sciatica, right side: Secondary | ICD-10-CM

## 2017-06-12 DIAGNOSIS — M545 Low back pain: Secondary | ICD-10-CM | POA: Diagnosis present

## 2017-06-12 DIAGNOSIS — Z96612 Presence of left artificial shoulder joint: Secondary | ICD-10-CM | POA: Diagnosis not present

## 2017-06-12 DIAGNOSIS — M25551 Pain in right hip: Secondary | ICD-10-CM

## 2017-06-12 MED ORDER — METHYLPREDNISOLONE ACETATE 80 MG/ML IJ SUSP
80.0000 mg | Freq: Once | INTRAMUSCULAR | Status: AC
  Administered 2017-06-12: 80 mg via INTRA_ARTICULAR
  Filled 2017-06-12: qty 1

## 2017-06-12 MED ORDER — IOPAMIDOL (ISOVUE-M 200) INJECTION 41%
10.0000 mL | Freq: Once | INTRAMUSCULAR | Status: DC
  Filled 2017-06-12: qty 10

## 2017-06-12 MED ORDER — ROPIVACAINE HCL 2 MG/ML IJ SOLN
9.0000 mL | Freq: Once | INTRAMUSCULAR | Status: AC
  Administered 2017-06-12: 9 mL via INTRA_ARTICULAR
  Filled 2017-06-12: qty 10

## 2017-06-12 MED ORDER — LIDOCAINE HCL 2 % IJ SOLN
20.0000 mL | Freq: Once | INTRAMUSCULAR | Status: AC
  Administered 2017-06-12: 400 mg

## 2017-06-12 MED ORDER — FENTANYL CITRATE (PF) 100 MCG/2ML IJ SOLN
25.0000 ug | INTRAMUSCULAR | Status: DC | PRN
  Administered 2017-06-12: 50 ug via INTRAVENOUS
  Filled 2017-06-12: qty 2

## 2017-06-12 MED ORDER — ROPIVACAINE HCL 2 MG/ML IJ SOLN
4.0000 mL | Freq: Once | INTRAMUSCULAR | Status: AC
  Administered 2017-06-12: 4 mL via INTRA_ARTICULAR
  Filled 2017-06-12: qty 10

## 2017-06-12 MED ORDER — MIDAZOLAM HCL 5 MG/5ML IJ SOLN
1.0000 mg | INTRAMUSCULAR | Status: DC | PRN
  Administered 2017-06-12: 2 mg via INTRAVENOUS
  Filled 2017-06-12: qty 5

## 2017-06-12 MED ORDER — LACTATED RINGERS IV SOLN: 1000.0000 mL | Freq: Once | INTRAVENOUS | Status: DC

## 2017-06-12 NOTE — Patient Instructions (Signed)

## 2017-06-12 NOTE — Progress Notes (Signed)
Patient c/o extreme groin pain, cramping and a lot of pain upon movement.  Dr [name] notified.  States that it should subside over the next couple of days and to treat with ice for now.

## 2017-06-13 ENCOUNTER — Telehealth: Payer: Self-pay

## 2017-06-13 NOTE — Telephone Encounter (Signed)
Post procedure phone call.  Patient states she is having some pain, but is putting heat on it today.  Instructed patient to call us for any further questions or concerns.

## 2017-06-26 ENCOUNTER — Ambulatory Visit: Payer: Medicaid Other | Admitting: Nurse Practitioner

## 2017-07-24 ENCOUNTER — Encounter: Payer: Self-pay | Admitting: Nurse Practitioner

## 2017-07-24 ENCOUNTER — Other Ambulatory Visit: Payer: Self-pay

## 2017-07-24 ENCOUNTER — Ambulatory Visit: Payer: Medicaid Other | Attending: Nurse Practitioner | Admitting: Nurse Practitioner

## 2017-07-24 VITALS — BP 116/84 | HR 91 | Temp 98.4°F | Resp 16 | Ht 65.0 in | Wt 242.0 lb

## 2017-07-24 DIAGNOSIS — M25511 Pain in right shoulder: Secondary | ICD-10-CM | POA: Diagnosis not present

## 2017-07-24 DIAGNOSIS — M533 Sacrococcygeal disorders, not elsewhere classified: Secondary | ICD-10-CM | POA: Insufficient documentation

## 2017-07-24 DIAGNOSIS — J449 Chronic obstructive pulmonary disease, unspecified: Secondary | ICD-10-CM | POA: Diagnosis not present

## 2017-07-24 DIAGNOSIS — M7918 Myalgia, other site: Secondary | ICD-10-CM | POA: Diagnosis not present

## 2017-07-24 DIAGNOSIS — G894 Chronic pain syndrome: Secondary | ICD-10-CM | POA: Diagnosis not present

## 2017-07-24 DIAGNOSIS — M25551 Pain in right hip: Secondary | ICD-10-CM | POA: Diagnosis not present

## 2017-07-24 DIAGNOSIS — Z79899 Other long term (current) drug therapy: Secondary | ICD-10-CM | POA: Diagnosis not present

## 2017-07-24 DIAGNOSIS — I10 Essential (primary) hypertension: Secondary | ICD-10-CM | POA: Diagnosis not present

## 2017-07-24 DIAGNOSIS — Z5181 Encounter for therapeutic drug level monitoring: Secondary | ICD-10-CM | POA: Insufficient documentation

## 2017-07-24 DIAGNOSIS — M25552 Pain in left hip: Secondary | ICD-10-CM | POA: Diagnosis not present

## 2017-07-24 DIAGNOSIS — M160 Bilateral primary osteoarthritis of hip: Secondary | ICD-10-CM | POA: Diagnosis not present

## 2017-07-24 DIAGNOSIS — G8929 Other chronic pain: Secondary | ICD-10-CM

## 2017-07-24 DIAGNOSIS — Z6841 Body Mass Index (BMI) 40.0 and over, adult: Secondary | ICD-10-CM | POA: Insufficient documentation

## 2017-07-24 DIAGNOSIS — F17210 Nicotine dependence, cigarettes, uncomplicated: Secondary | ICD-10-CM | POA: Diagnosis not present

## 2017-07-24 DIAGNOSIS — M25512 Pain in left shoulder: Secondary | ICD-10-CM | POA: Diagnosis not present

## 2017-07-24 DIAGNOSIS — Z79891 Long term (current) use of opiate analgesic: Secondary | ICD-10-CM | POA: Insufficient documentation

## 2017-07-24 DIAGNOSIS — F329 Major depressive disorder, single episode, unspecified: Secondary | ICD-10-CM | POA: Diagnosis not present

## 2017-07-24 DIAGNOSIS — M62830 Muscle spasm of back: Secondary | ICD-10-CM | POA: Diagnosis not present

## 2017-07-24 MED ORDER — OXYCODONE HCL 10 MG PO TABS: 10.0000 mg | ORAL_TABLET | Freq: Four times a day (QID) | ORAL | 0 refills | Status: DC | PRN

## 2017-07-24 MED ORDER — DICLOFENAC SODIUM 1 % TD GEL: 2.0000 g | Freq: Four times a day (QID) | TRANSDERMAL | 2 refills | Status: DC

## 2017-07-24 MED ORDER — TIZANIDINE HCL 4 MG PO TABS: 4.0000 mg | ORAL_TABLET | Freq: Three times a day (TID) | ORAL | 0 refills | Status: DC | PRN

## 2017-07-24 NOTE — Progress Notes (Signed)
Nursing Pain Medication Assessment:  Safety precautions to be maintained throughout the outpatient stay will include: orient to surroundings, keep bed in low position, maintain call bell within reach at all times, provide assistance with transfer out of bed and ambulation.  Medication Inspection Compliance: Pill count conducted under aseptic conditions, in front of the patient. Neither the pills nor the bottle was removed from the patient's sight at any time. Once count was completed pills were immediately returned to the patient in their original bottle.  Medication: Oxycodone IR Pill/Patch Count: 7 of 120 pills remain Pill/Patch Appearance: Markings consistent with prescribed medication Bottle Appearance: Standard pharmacy container. Clearly labeled. Filled Date: 12/12 / 2018 Last Medication intake:  Today

## 2017-07-24 NOTE — Patient Instructions (Addendum)
____________________________________________________________________________________________  Medication Rules  Applies to: All patients receiving prescriptions (written or electronic).  Pharmacy of record: Pharmacy where electronic prescriptions will be sent. If written prescriptions are taken to a different pharmacy, please inform the nursing staff. The pharmacy listed in the electronic medical record should be the one where you would like electronic prescriptions to be sent.  Prescription refills: Only during scheduled appointments. Applies to both, written and electronic prescriptions.  NOTE: The following applies primarily to controlled substances (Opioid* Pain Medications).   Patient's responsibilities: 1. Pain Pills: Bring all pain pills to every appointment (except for procedure appointments). 2. Pill Bottles: Bring pills in original pharmacy bottle. Always bring newest bottle. Bring bottle, even if empty. 3. Medication refills: You are responsible for knowing and keeping track of what medications you need refilled. The day before your appointment, write a list of all prescriptions that need to be refilled. Bring that list to your appointment and give it to the admitting nurse. Prescriptions will be written only during appointments. If you forget a medication, it will not be "Called in", "Faxed", or "electronically sent". You will need to get another appointment to get these prescribed. 4. Prescription Accuracy: You are responsible for carefully inspecting your prescriptions before leaving our office. Have the discharge nurse carefully go over each prescription with you, before taking them home. Make sure that your name is accurately spelled, that your address is correct. Check the name and dose of your medication to make sure it is accurate. Check the number of pills, and the written instructions to make sure they are clear and accurate. Make sure that you are given enough medication to  last until your next medication refill appointment. 5. Taking Medication: Take medication as prescribed. Never take more pills than instructed. Never take medication more frequently than prescribed. Taking less pills or less frequently is permitted and encouraged, when it comes to controlled substances (written prescriptions).  6. Inform other Doctors: Always inform, all of your healthcare providers, of all the medications you take. 7. Pain Medication from other Providers: You are not allowed to accept any additional pain medication from any other Doctor or Healthcare provider. There are two exceptions to this rule. (see below) In the event that you require additional pain medication, you are responsible for notifying us, as stated below. 8. Medication Agreement: You are responsible for carefully reading and following our Medication Agreement. This must be signed before receiving any prescriptions from our practice. Safely store a copy of your signed Agreement. Violations to the Agreement will result in no further prescriptions. (Additional copies of our Medication Agreement are available upon request.) 9. Laws, Rules, & Regulations: All patients are expected to follow all Federal and State Laws, Statutes, Rules, & Regulations. Ignorance of the Laws does not constitute a valid excuse. The use of any illegal substances is prohibited. 10. Adopted CDC guidelines & recommendations: Target dosing levels will be at or below 60 MME/day. Use of benzodiazepines** is not recommended.  Exceptions: There are only two exceptions to the rule of not receiving pain medications from other Healthcare Providers. 1. Exception #1 (Emergencies): In the event of an emergency (i.e.: accident requiring emergency care), you are allowed to receive additional pain medication. However, you are responsible for: As soon as you are able, call our office [phone], at any time of the day or night, and leave a message stating your  name, the date and nature of the emergency, and the name and dose of the medication   prescribed. In the event that your call is answered by a member of our staff, make sure to document and save the date, time, and the name of the person that took your information.  2. Exception #2 (Planned Surgery): In the event that you are scheduled by another doctor or dentist to have any type of surgery or procedure, you are allowed (for a period no longer than 30 days), to receive additional pain medication, for the acute post-op pain. However, in this case, you are responsible for picking up a copy of our "Post-op Pain Management for Surgeons" handout, and giving it to your surgeon or dentist. This document is available at our office, and does not require an appointment to obtain it. Simply go to our office during business hours (Monday-Thursday from 8:00 AM to 4:00 PM) (Friday 8:00 AM to 12:00 Noon) or if you have a scheduled appointment with [place], prior to your surgery, and ask for it by name. In addition, you will need to provide [place] with your name, name of your surgeon, type of surgery, and date of procedure or surgery.  *Opioid medications include: morphine, codeine, oxycodone, oxymorphone, hydrocodone, hydromorphone, meperidine, tramadol, tapentadol, buprenorphine, fentanyl, methadone. **Benzodiazepine medications include: diazepam (Valium), alprazolam (Xanax), clonazepam (Klonopine), lorazepam (Ativan), clorazepate (Tranxene), chlordiazepoxide (Librium), estazolam (Prosom), oxazepam (Serax), temazepam (Restoril), triazolam (Halcion)  You were given 3 prescriptions for Oxycodone today.  ____________________________________________________________________________________________   ____________________________________________________________________________________________  Preparing for Procedure with Sedation Instructions: . Oral Intake: Do not eat or drink anything for at least 8 hours prior to your  procedure. . Transportation: Public transportation is not allowed. Bring an adult driver. The driver must be physically present in our waiting room before any procedure can be started. [facility] Physical Assistance: Bring an adult physically capable of assisting you, in the event you need help. This adult should keep you company at home for at least 6 hours after the procedure. . Blood Pressure Medicine: Take your blood pressure medicine with a sip of water the morning of the procedure. . Blood thinners:  . Diabetics on insulin: Notify the staff so that you can be scheduled 1st case in the morning. If your diabetes requires high dose insulin, take only  of your normal insulin dose the morning of the procedure and notify the staff that you have done so. . Preventing infections: Shower with an antibacterial soap the morning of your procedure. . Build-up your immune system: Take 1000 mg of Vitamin C with every meal (3 times a day) the day prior to your procedure. [place] Kitchen Antibiotics: Inform the staff if you have a condition or reason that requires you to take antibiotics before dental procedures. . Pregnancy: If you are pregnant, call and cancel the procedure. . Sickness: If you have a cold, fever, or any active infections, call and cancel the procedure. . Arrival: You must be in the facility at least 30 minutes prior to your scheduled procedure. . Children: Do not bring children with you. . Dress appropriately: Bring dark clothing that you would not mind if they get stained. . Valuables: Do not bring any jewelry or valuables. Procedure appointments are reserved for interventional treatments only. [place]land Kitchen No Prescription Refills. . No medication changes will be discussed during procedure appointments. . No disability issues will be discussed. ____________________________________________________________________________________________

## 2017-07-24 NOTE — Progress Notes (Addendum)
Patient's Name: Danielle [name]  MRN: [id number]  Referring Provider: Marygrace Drought, MD  DOB: Dec 11, 1960  PCP: Marygrace Drought, MD  DOS: 07/24/2017  Note by: Vevelyn Francois NP  Service setting: Ambulatory outpatient  Specialty: Interventional Pain Management  Location: [facility] (AMB) Pain Management Facility    Patient type: Established    Primary Reason(s) for Visit: Encounter for prescription drug management & post-procedure evaluation of chronic illness with mild to moderate exacerbation(Level of risk: moderate) CC: Shoulder Pain (left)  HPI  Danielle [name] is a 57 y.o. year old, female patient, who comes today for a post-procedure evaluation and medication management. She has Long term prescription opiate use; Opiate use (60 MME/Day); B12 deficiency; Cervical spinal cord compression [facility]) (April, 2017); Cervical spinal stenosis; Chronic obstructive pulmonary disease ([place]); Clinical depression; Adult hypothyroidism; Adiposity; Mucositis oral; Groin pain; Current tobacco use; Chronic shoulder pain (Location of Primary Source of Pain) (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); History of total shoulder replacement (Bilateral); Chronic low back pain (Location of Tertiary source of pain) (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); Lumbar facet syndrome (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); Lumbar spondylosis; Failed back surgical syndrome; Chronic lower extremity pain (Right); Chronic neck pain (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); Chronic cervical radicular pain (Right); Ulnar neuropathy of left upper extremity; Hx of cervical spine surgery; Cervical spondylosis; Encounter for therapeutic drug level monitoring; Encounter for pain management planning; Fibromyalgia; Chronic sacroiliac pain (B) (R>L); Seasonal allergic rhinitis; Status post total replacement of right shoulder; Hypertension; Chronic pain syndrome; Complaints of weakness of lower extremity; Morbid obesity with BMI of 40.0-44.9, adult ([place]); Anterolisthesis; Cervical facet syndrome ([place]); Chronic musculoskeletal pain; Muscle spasm  of back; Encounter for screening colonoscopy; Rectal bleeding; Mixed simple and mucopurulent chronic bronchitis ([place]); Nausea; Vasovagal episode; Myofascial pain; Chronic hip pain (Location of Secondary source of pain) (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); 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); Trigger point with back pain; and Morbid (severe) obesity due to excess calories (HCC) on their problem list. Her primarily concern today is the Shoulder Pain (left)  Pain Assessment: Location: Left Shoulder Radiating: n/a Onset: More than a month ago Duration: Chronic pain Quality: Stabbing, Aching Severity: 6 /10 (self-reported pain score)  Note: Reported level is compatible with observation.                         When using our objective Pain Scale, levels between 6 and 10/10 are said to belong in an emergency room, as it progressively worsens from a 6/10, described as severely limiting, requiring emergency care not usually available at an outpatient pain management facility. At a 6/10 level, communication becomes difficult and requires great effort. Assistance to reach the emergency department may be required. Facial flushing and profuse sweating along with potentially dangerous increases in heart rate and blood pressure will be evident. Effect on ADL:   Timing: Intermittent Modifying factors: nothing  Danielle [name] was last seen on 05/27/2017 for a procedure. During today's appointment we reviewed Danielle [name]'s post-procedure results, as well as her outpatient medication regimen. She admits that she had numbness in her right leg after the injection . She admits that it was numb for 7-8 hours . She had right groin pain. She used ice to the area for day 1. She then used heat which was effective.  She is having increased shoulder pain secondary to her new hobby. She is doing acrylic painting/art. She is having pain and tightness. She would like to prepare for the suprascapular RFA. She admits that she has dry mouth with the  flexeril. She  admits that the baclofen which was not effective.  She denies the use of tizanidine.  Further details on both, my assessment(s), as well as the proposed treatment plan, please see below.  Controlled Substance Pharmacotherapy Assessment REMS (Risk Evaluation and Mitigation Strategy)  Analgesic:Oxycodone IR 10 mg every 6 hours (40 mg/dayof oxycodone) MME/day:93m/day    WLandis Martins RN  07/24/2017  2:28 PM  Sign at close encounter Nursing Pain Medication Assessment:  Safety precautions to be maintained throughout the outpatient stay will include: orient to surroundings, keep bed in low position, maintain call bell within reach at all times, provide assistance with transfer out of bed and ambulation.  Medication Inspection Compliance: Pill count conducted under aseptic conditions, in front of the patient. Neither the pills nor the bottle was removed from the patient's sight at any time. Once count was completed pills were immediately returned to the patient in their original bottle.  Medication: Oxycodone IR Pill/Patch Count: 7 of 120 pills remain Pill/Patch Appearance: Markings consistent with prescribed medication Bottle Appearance: Standard pharmacy container. Clearly labeled. Filled Date: 12/12 / 2018 Last Medication intake:  Today   Pharmacokinetics: Liberation and absorption (onset of action): WNL Distribution (time to peak effect): WNL Metabolism and excretion (duration of action): WNL         Pharmacodynamics: Desired effects: Analgesia: Danielle [name] >50% benefit. Functional ability: Patient reports that medication allows her to accomplish basic ADLs Clinically meaningful improvement in function (CMIF): Sustained CMIF goals met Perceived effectiveness: Described as relatively effective, allowing for increase in activities of daily living (ADL) Undesirable effects: Side-effects or Adverse reactions: None reported Monitoring: [place] PMP: Online review  of the past 110-montheriod conducted. Compliant with practice rules and regulations Last UDS on record: Summary  Date Value Ref Range Status  04/24/2017 FINAL  Final    Comment:    ==================================================================== TOXASSURE SELECT 13 (MW) ==================================================================== Test                             Result       Flag       Units Drug Present and Declared for Prescription Verification   Oxycodone                      1251         EXPECTED   ng/mg creat   Oxymorphone                    78           EXPECTED   ng/mg creat   Noroxycodone                   2117         EXPECTED   ng/mg creat    Sources of oxycodone include scheduled prescription medications.    Oxymorphone and noroxycodone are expected metabolites of    oxycodone. Oxymorphone is also available as a scheduled    prescription medication. Drug Present not Declared for Prescription Verification   Hydrocodone                    527          UNEXPECTED ng/mg creat   Dihydrocodeine                 59           UNEXPECTED ng/mg creat  Norhydrocodone                 288          UNEXPECTED ng/mg creat    Sources of hydrocodone include scheduled prescription    medications. Dihydrocodeine and norhydrocodone are expected    metabolites of hydrocodone. Dihydrocodeine is also available as a    scheduled prescription medication. ==================================================================== Test                      Result    Flag   Units      Ref Range   Creatinine              127              mg/dL      >=20 ==================================================================== Declared Medications:  The flagging and interpretation on this report are based on the  following declared medications.  Unexpected results may arise from  inaccuracies in the declared medications.  **Note: The testing scope of this panel includes these medications:   Oxycodone  **Note: The testing scope of this panel does not include following  reported medications:  Albuterol  Cetirizine  Citalopram (Celexa)  Cyclobenzaprine (Flexeril)  Diclofenac (Voltaren)  Docusate (Colace)  Fluconazole (Diflucan)  Levothyroxine  Loratadine (Claritin)  Magnesium (Milk of Magnesia)  Montelukast (Singulair)  Naloxone (Narcan)  Nystatin  Tiotropium (Spiriva)  Vitamin B (Biotin)  Vitamin B12  Vitamin D3 ==================================================================== For clinical consultation, please call [phone]. ====================================================================    UDS interpretation: Compliant         RFA  Rx for acute pain Medication Assessment Form: Reviewed. Patient indicates being compliant with therapy Treatment compliance: Compliant Risk Assessment Profile: Aberrant behavior: See prior evaluations. None observed or detected today Comorbid factors increasing risk of overdose: See prior notes. No additional risks detected today Risk of substance use disorder (SUD): Low  ORT Scoring interpretation table:  Score <3 = Low Risk for SUD  Score between 4-7 = Moderate Risk for SUD  Score >8 = High Risk for Opioid Abuse   Risk Mitigation Strategies:  Patient Counseling: Covered Patient-Prescriber Agreement (PPA): Present and active  Notification to other healthcare providers: Done  Pharmacologic Plan: No change in therapy, at this time.             Post-Procedure Assessment  06/12/2017 Procedure: Right sacroiliac joint injection and right intra-articular hip injection Pre-procedure pain score:  3/10 Post-procedure pain score: 5/10         Influential Factors: BMI: 40.27 kg/m Intra-procedural challenges: None observed.         Assessment challenges: None detected.              Reported side-effects: None.        Post-procedural adverse reactions or complications: None reported         Sedation: Please see nurses  note. When no sedatives are used, the analgesic levels obtained are directly associated to the effectiveness of the local anesthetics. However, when sedation is provided, the level of analgesia obtained during the initial 1 hour following the intervention, is believed to be the result of a combination of factors. These factors may include, but are not limited to: 1. The effectiveness of the local anesthetics used. 2. The effects of the analgesic(s) and/or anxiolytic(s) used. 3. The degree of discomfort experienced by the patient at the time of the procedure. 4. The patients ability and reliability in recalling and recording the events. 5.  The presence and influence of possible secondary gains and/or psychosocial factors. Reported result: Relief experienced during the 1st hour after the procedure: 0 % (Ultra-Short Term Relief)            Interpretative annotation: Clinically appropriate result. Analgesia during this period is likely to be Local Anesthetic and/or IV Sedative (Analgesic/Anxiolytic) related.          Effects of local anesthetic: The analgesic effects attained during this period are directly associated to the localized infiltration of local anesthetics and therefore cary significant diagnostic value as to the etiological location, or anatomical origin, of the pain. Expected duration of relief is directly dependent on the pharmacodynamics of the local anesthetic used. Long-acting (4-6 hours) anesthetics used.  Reported result: Relief during the next 4 to 6 hour after the procedure: 0 % (Short-Term Relief)            Interpretative annotation: Clinically appropriate result. Analgesia during this period is likely to be Local Anesthetic-related.          Long-term benefit: Defined as the period of time past the expected duration of local anesthetics (1 hour for short-acting and 4-6 hours for long-acting). With the possible exception of prolonged sympathetic blockade from the local anesthetics,  benefits during this period are typically attributed to, or associated with, other factors such as analgesic sensory neuropraxia, antiinflammatory effects, or beneficial biochemical changes provided by agents other than the local anesthetics.  Reported result: Extended relief following procedure: 80 % (Long-Term Relief)            Interpretative annotation: Clinically appropriate result. Good relief. No permanent benefit expected. Inflammation plays a part in the etiology to the pain.          Current benefits: Defined as reported results that persistent at this point in time.   Analgesia: >50 %            Function: Somewhat improved ROM: Somewhat improved Interpretative annotation: Recurrence of symptoms. No permanent benefit expected. Effective palliative intervention.          Interpretation: Results would suggest a successful palliative intervention.                  Plan:  Please see "Plan of Care" for details.        Laboratory Chemistry  Inflammation Markers (CRP: Acute Phase) (ESR: Chronic Phase) Lab Results  Component Value Date   CRP 0.9 01/08/2016   ESRSEDRATE 37 (H) 01/08/2016                 Rheumatology Markers No results found for: RF, ANA, Therisa [place], [facility]              Renal Function Markers Lab Results  Component Value Date   BUN 8 01/08/2016   CREATININE 0.94 01/08/2016   GFRAA >60 01/08/2016   GFRNONAA >60 01/08/2016                 Hepatic Function Markers Lab Results  Component Value Date   AST 18 01/08/2016   ALT 14 01/08/2016   ALBUMIN 3.7 01/08/2016   ALKPHOS 79 01/08/2016                 Electrolytes Lab Results  Component Value Date   NA 137 01/08/2016   K 3.5 01/08/2016   CL 101 01/08/2016   CALCIUM 9.0 01/08/2016   MG 2.1 01/08/2016  Neuropathy Markers Lab Results  Component Value Date   VITAMINB12 >7,500 (H) 01/08/2016                 Bone Pathology Markers Lab Results  Component Value  Date   25OHVITD1 32 01/08/2016   25OHVITD2 2.0 01/08/2016   25OHVITD3 30 01/08/2016                 Coagulation Parameters No results found for: INR, LABPROT, APTT, PLT, DDIMER               Cardiovascular Markers No results found for: BNP, CKTOTAL, CKMB, TROPONINI, HGB, HCT               CA Markers No results found for: CEA, CA125, LABCA2               Note: Lab results reviewed.  Recent Diagnostic Imaging Results  DG C-Arm 1-60 Min-No Report Fluoroscopy was utilized by the requesting physician.  No radiographic  interpretation.   Complexity Note: Imaging results reviewed. Results shared with Danielle [name], using Layman's terms.                         Meds   Current Outpatient Medications:  .  albuterol (PROVENTIL HFA;VENTOLIN HFA) 108 (90 Base) MCG/ACT inhaler, Inhale 2 puffs into the lungs every 4 (four) hours as needed for wheezing or shortness of breath. , Disp: , Rfl:  .  Biotin 10 MG CAPS, Take 1 tablet by mouth daily., Disp: , Rfl:  .  Cholecalciferol (VITAMIN D3) 2000 units capsule, Take 2,000 Units by mouth daily. , Disp: , Rfl:  .  citalopram (CELEXA) 40 MG tablet, Take 40 mg by mouth daily., Disp: , Rfl: 3 .  cyanocobalamin (,VITAMIN B-12,) 1000 MCG/ML injection, Inject into the muscle., Disp: , Rfl:  .  cyclobenzaprine (FLEXERIL) 10 MG tablet, Take 1 tablet (10 mg total) by mouth 3 (three) times daily as needed for muscle spasms., Disp: 90 tablet, Rfl: 2 .  diclofenac sodium (VOLTAREN) 1 % GEL, Apply 2 g topically 4 (four) times daily., Disp: 100 g, Rfl: 2 .  docusate sodium (COLACE) 100 MG capsule, Take 100 mg by mouth 2 (two) times daily., Disp: , Rfl:  .  levothyroxine (SYNTHROID, LEVOTHROID) 50 MCG tablet, TAKE 1 TABLET (50 MCG TOTAL) BY MOUTH DAILY AT 0600., Disp: , Rfl: 11 .  magnesium hydroxide (MILK OF MAGNESIA) 400 MG/5ML suspension, Take by mouth., Disp: , Rfl:  .  montelukast (SINGULAIR) 10 MG tablet, TAKE 1 TABLET (10 MG TOTAL) BY MOUTH NIGHTLY., Disp: ,  Rfl: 3 .  naloxone (NARCAN) 2 MG/2ML injection, Inject content of syringe into thigh muscle. Call 911., Disp: 2 Syringe, Rfl: 1 .  nystatin (MYCOSTATIN/NYSTOP) powder, Apply topically., Disp: , Rfl:  .  [START ON 09/23/2017] Oxycodone HCl 10 MG TABS, Take 1 tablet (10 mg total) by mouth every 6 (six) hours as needed., Disp: 120 tablet, Rfl: 0 .  SPIRIVA HANDIHALER 18 MCG inhalation capsule, PLACE 1 CAPSULE (18 MCG TOTAL) INTO INHALER AND INHALE ONCE DAILY., Disp: , Rfl: 5 .  cetirizine (ZYRTEC) 10 MG tablet, Take 10 mg by mouth daily. , Disp: , Rfl:  .  loratadine (CLARITIN) 10 MG tablet, TAKE 1 TABLET (10 MG TOTAL) BY MOUTH DAILY., Disp: , Rfl: 11 .  [START ON 08/24/2017] Oxycodone HCl 10 MG TABS, Take 1 tablet (10 mg total) by mouth every 6 (six) hours as needed., Disp:  120 tablet, Rfl: 0 .  [START ON 07/25/2017] Oxycodone HCl 10 MG TABS, Take 1 tablet (10 mg total) by mouth every 6 (six) hours as needed., Disp: 120 tablet, Rfl: 0 .  [START ON 07/25/2017] tiZANidine (ZANAFLEX) 4 MG tablet, Take 1 tablet (4 mg total) by mouth every 8 (eight) hours as needed for muscle spasms., Disp: 90 tablet, Rfl: 0  ROS  Constitutional: Denies any fever or chills Gastrointestinal: No reported hemesis, hematochezia, vomiting, or acute GI distress Musculoskeletal: Denies any acute onset joint swelling, redness, loss of ROM, or weakness Neurological: No reported episodes of acute onset apraxia, aphasia, dysarthria, agnosia, amnesia, paralysis, loss of coordination, or loss of consciousness  Allergies  Danielle [name] is allergic to aripiprazole; duloxetine; gabapentin; gold; iodinated diagnostic agents; naproxen sodium; nortriptyline hcl; nsaids; pregabalin; ace inhibitors; aspirin; cefpodoxime; fluoxetine; fluticasone-salmeterol; levofloxacin; lithium; meperidine; paroxetine; tape; telithromycin; theophylline; topiramate; trazodone; triamcinolone; venlafaxine; bupropion; ketorolac tromethamine; morphine; and  moxifloxacin.  [place]  Drug: Danielle [name]  reports that she does not use drugs. Alcohol:  reports that she does not drink alcohol. Tobacco:  reports that she has been smoking cigarettes.  She has a 40.00 pack-year smoking history. she has never used smokeless tobacco. Medical:  has a past medical history of Anxiety, Arthritis, Asthma, Bell's palsy, Bursitis, COPD (chronic obstructive pulmonary disease) ([place]), Depression, Fibromyalgia, Heart murmur, Hepatitis C, Hiatal hernia, Hyperlipidemia, Hypertension, IBS (irritable bowel syndrome), Insomnia, and Thyroid disease. Surgical: Danielle [name]  has a past surgical history that includes necksurgery (10/21/2014); Back surgery (2013); Foot surgery (Right); Elbow surgery (Left); Carpal tunnel release (Right); Nose surgery; Partial hysterectomy; Total shoulder replacement (Bilateral); and Colonoscopy with propofol (N/A, 02/05/2017). Family: family history includes Cancer in her mother; Gout in her mother; Heart disease in her father.  Constitutional Exam  General appearance: Well nourished, well developed, and well hydrated. In no apparent acute distress Vitals:   07/24/17 1423  BP: 116/84  Pulse: 91  Resp: 16  Temp: 98.4 F (36.9 C)  TempSrc: Oral  SpO2: 100%  Weight: 242 lb (109.8 kg)  Height: 5' 5" (1.651 m)   BMI Assessment: Estimated body mass index is 40.27 kg/m as calculated from the following:   Height as of this encounter: 5' 5" (1.651 m).   Weight as of this encounter: 242 lb (109.8 kg). Psych/Mental status: Alert, oriented x 3 (person, place, & time)       Eyes: PERLA Respiratory: No evidence of acute respiratory distress  Cervical Spine Area Exam  Skin & Axial Inspection: No masses, redness, edema, swelling, or associated skin lesions Alignment: Symmetrical Functional ROM: Unrestricted ROM      Stability: No instability detected Muscle Tone/Strength: Functionally intact. No obvious neuro-muscular anomalies detected. Sensory  (Neurological): Unimpaired Palpation: Increased muscle tone              Upper Extremity (UE) Exam    Side: Right upper extremity  Side: Left upper extremity  Skin & Extremity Inspection: Skin color, temperature, and hair growth are WNL. No peripheral edema or cyanosis. No masses, redness, swelling, asymmetry, or associated skin lesions. No contractures.  Skin & Extremity Inspection: Skin color, temperature, and hair growth are WNL. No peripheral edema or cyanosis. No masses, redness, swelling, asymmetry, or associated skin lesions. No contractures.  Functional ROM: Unrestricted ROM          Functional ROM: Unrestricted ROM          Muscle Tone/Strength: Functionally intact. No obvious neuro-muscular anomalies detected.  Muscle Tone/Strength: Functionally  intact. No obvious neuro-muscular anomalies detected.  Sensory (Neurological): Unimpaired          Sensory (Neurological): Unimpaired          Palpation: No palpable anomalies              Palpation: No palpable anomalies              Specialized Test(s): Deferred         Specialized Test(s): Deferred          Thoracic Spine Area Exam  Skin & Axial Inspection: No masses, redness, or swelling Alignment: Symmetrical Functional ROM: Unrestricted ROM Stability: No instability detected Muscle Tone/Strength: Functionally intact. No obvious neuro-muscular anomalies detected. Sensory (Neurological): Unimpaired Muscle strength & Tone: No palpable anomalies  Lumbar Spine Area Exam  Skin & Axial Inspection: No masses, redness, or swelling Alignment: Symmetrical Functional ROM: Unrestricted ROM      Stability: No instability detected Muscle Tone/Strength: Functionally intact. No obvious neuro-muscular anomalies detected. Sensory (Neurological): Unimpaired Palpation: No palpable anomalies       Provocative Tests: Lumbar Hyperextension and rotation test: evaluation deferred today       Lumbar Lateral bending test: evaluation deferred today        Patrick's Maneuver: evaluation deferred today                    Gait & Posture Assessment  Ambulation: Unassisted Gait: Relatively normal for age and body habitus Posture: WNL   Lower Extremity Exam    Side: Right lower extremity  Side: Left lower extremity  Skin & Extremity Inspection: Skin color, temperature, and hair growth are WNL. No peripheral edema or cyanosis. No masses, redness, swelling, asymmetry, or associated skin lesions. No contractures.  Skin & Extremity Inspection: Skin color, temperature, and hair growth are WNL. No peripheral edema or cyanosis. No masses, redness, swelling, asymmetry, or associated skin lesions. No contractures.  Functional ROM: Unrestricted ROM          Functional ROM: Unrestricted ROM          Muscle Tone/Strength: Functionally intact. No obvious neuro-muscular anomalies detected.  Muscle Tone/Strength: Functionally intact. No obvious neuro-muscular anomalies detected.  Sensory (Neurological): Unimpaired  Sensory (Neurological): Unimpaired  Palpation: No palpable anomalies  Palpation: No palpable anomalies   Assessment  Primary Diagnosis & Pertinent Problem List: The primary encounter diagnosis was Chronic hip pain (Location of Secondary source of pain) (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). Diagnoses of Chronic shoulder pain (Location of Primary Source of Pain) (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), Chronic pain syndrome, Chronic musculoskeletal pain, 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), and Muscle spasm of back were also pertinent to this visit.  Status Diagnosis  Controlled Controlled Controlled 1. Chronic hip pain (Location of Secondary source of pain) (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)   2. Chronic shoulder pain (Location of Primary Source of Pain) (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)   3. Chronic pain syndrome   4. Chronic musculoskeletal pain   5. 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 Muscle spasm of back     Problems updated and reviewed during this visit: Problem  Morbid (Severe) Obesity Due to  Excess Calories ([facility])   Overview:  per last BMI in vitals on 05/07/2017    Plan of Care  Pharmacotherapy (Medications Ordered): Meds ordered this encounter  Medications  . Oxycodone HCl 10 MG TABS    Sig: Take 1 tablet (10 mg total) by mouth every 6 (six) hours as needed.    Dispense:  120 tablet    Refill:  0    Do not add this medication to the electronic "Automatic Refill" notification system. Patient may have prescription filled one day early if pharmacy is closed on scheduled refill date. Do not fill until: 09/23/2017 To last until: 10/23/2017    Order Specific Question:   Supervising Provider    Answer:   Milinda Pointer [phone]  . Oxycodone HCl 10 MG TABS    Sig: Take 1 tablet (10 mg total) by mouth every 6 (six) hours as needed.    Dispense:  120 tablet    Refill:  0    Do not add this medication to the electronic "Automatic Refill" notification system. Patient may have prescription filled one day early if pharmacy is closed on scheduled refill date. Do not fill until: 08/24/2017 To last until:09/23/2017    Order Specific Question:   Supervising Provider    Answer:   Milinda Pointer [phone]  . Oxycodone HCl 10 MG TABS    Sig: Take 1 tablet (10 mg total) by mouth every 6 (six) hours as needed.    Dispense:  120 tablet    Refill:  0    Do not add this medication to the electronic "Automatic Refill" notification system. Patient may have prescription filled one day early if pharmacy is closed on scheduled refill date. Do not fill until: 07/25/2017 To last until:08/24/2017    Order Specific Question:   Supervising Provider    Answer:   Milinda Pointer [phone]  . tiZANidine (ZANAFLEX) 4 MG tablet    Sig: Take 1 tablet (4 mg total) by mouth every 8 (eight) hours as needed for muscle spasms.    Dispense:  90 tablet    Refill:  0    Do not place this medication, or any other prescription from our practice, on "Automatic Refill". Patient may have prescription filled one  day early if pharmacy is closed on scheduled refill date.    Order Specific Question:   Supervising Provider    Answer:   Milinda Pointer [phone]  . diclofenac sodium (VOLTAREN) 1 % GEL    Sig: Apply 2 g topically 4 (four) times daily.    Dispense:  100 g    Refill:  2    Order Specific Question:   Supervising Provider    Answer:   Milinda Pointer [phone]   New Prescriptions   TIZANIDINE (ZANAFLEX) 4 MG TABLET    Take 1 tablet (4 mg total) by mouth every 8 (eight) hours as needed for muscle spasms.   Medications administered today: Fredrica A. Domingo [name] "ANGIE" had no medications administered during this visit. Lab-work, procedure(s), and/or referral(s): Orders Placed This Encounter  Procedures  . SUPRASCAPULAR NERVE BLOCK   Imaging and/or referral(s): None  Interventional therapies: Planned, scheduled, and/or pending:  Not at this time    Considering:  Palliative bilateralsuprascapular nerve block Possible bilateral suprascapular nerve radiofrequencyablation Diagnostic bilateral intra-articular hip joint injection Possible bilateral hip joint radiofrequencyablation Diagnostic bilateral lumbar facet block Possible bilaterallumbar facet radiofrequencyablation Diagnostic caudal epiduralsteroid injection + diagnostic epidurogram Possible Racz procedure. Diagnostic bilateral cervical facet block Possible bilateral cervical facet radiofrequencyablation Diagnostic right-sided cervical epidural steroid injection Diagnostic bilateral sacroiliac joint block Possible bilateral sacroiliac joint RFA   Palliative PRN treatment(s):  Palliative bilateralsuprascapular nerve block Diagnostic bilateral intra-articular hip joint injection Diagnostic bilateral lumbar facet block Diagnostic caudal epiduralsteroid injection + diagnostic epidurogram Diagnostic bilateral cervical facet block Diagnostic right-sided cervical epidural steroid injection Diagnostic bilateral  sacroiliac joint block   Provider-requested follow-up: Return in  about 3 months (around 10/22/2017) for MedMgmt with Me Donella Stade Edison Pace).  Future Appointments  Date Time Provider [place]  08/19/2017  1:15 PM Milinda Pointer, MD [facility] None  10/22/2017  2:30 PM Vevelyn Francois, NP [facility] None   Primary Care Physician: Marygrace Drought, MD Location: [facility] Note by: Vevelyn Francois NP Date: 07/24/2017; Time: 3:38 PM  Pain Score Disclaimer: We use the NRS-11 scale. This is a self-reported, subjective measurement of pain severity with only modest accuracy. It is used primarily to identify changes within a particular patient. It must be understood that outpatient pain scales are significantly less accurate that those used for research, where they can be applied under ideal controlled circumstances with minimal exposure to variables. In reality, the score is likely to be a combination of pain intensity and pain affect, where pain affect describes the degree of emotional arousal or changes in action readiness caused by the sensory experience of pain. Factors such as social and work situation, setting, emotional state, anxiety levels, expectation, and prior pain experience may influence pain perception and show large inter-individual differences that may also be affected by time variables.  Patient instructions provided during this appointment: Patient Instructions   ____________________________________________________________________________________________  Medication Rules  Applies to: All patients receiving prescriptions (written or electronic).  Pharmacy of record: Pharmacy where electronic prescriptions will be sent. If written prescriptions are taken to a different pharmacy, please inform the nursing staff. The pharmacy listed in the electronic medical record should be the one where you would like electronic prescriptions to be  sent.  Prescription refills: Only during scheduled appointments. Applies to both, written and electronic prescriptions.  NOTE: The following applies primarily to controlled substances (Opioid* Pain Medications).   Patient's responsibilities: 1. Pain Pills: Bring all pain pills to every appointment (except for procedure appointments). 2. Pill Bottles: Bring pills in original pharmacy bottle. Always bring newest bottle. Bring bottle, even if empty. 3. Medication refills: You are responsible for knowing and keeping track of what medications you need refilled. The day before your appointment, write a list of all prescriptions that need to be refilled. Bring that list to your appointment and give it to the admitting nurse. Prescriptions will be written only during appointments. If you forget a medication, it will not be "Called in", "Faxed", or "electronically sent". You will need to get another appointment to get these prescribed. 4. Prescription Accuracy: You are responsible for carefully inspecting your prescriptions before leaving our office. Have the discharge nurse carefully go over each prescription with you, before taking them home. Make sure that your name is accurately spelled, that your address is correct. Check the name and dose of your medication to make sure it is accurate. Check the number of pills, and the written instructions to make sure they are clear and accurate. Make sure that you are given enough medication to last until your next medication refill appointment. 5. Taking Medication: Take medication as prescribed. Never take more pills than instructed. Never take medication more frequently than prescribed. Taking less pills or less frequently is permitted and encouraged, when it comes to controlled substances (written prescriptions).  6. Inform other Doctors: Always inform, all of your healthcare providers, of all the medications you take. 7. Pain Medication from other Providers: You  are not allowed to accept any additional pain medication from any other Doctor or Healthcare provider. There are two exceptions to this rule. (see below) In the event that you require additional  pain medication, you are responsible for notifying us, as stated below. 8. Medication Agreement: You are responsible for carefully reading and following our Medication Agreement. This must be signed before receiving any prescriptions from our practice. Safely store a copy of your signed Agreement. Violations to the Agreement will result in no further prescriptions. (Additional copies of our Medication Agreement are available upon request.) 9. Laws, Rules, & Regulations: All patients are expected to follow all Federal and [employer], Rules, [employer]. Ignorance of the Laws does not constitute a valid excuse. The use of any illegal substances is prohibited. 10. Adopted CDC guidelines & recommendations: Target dosing levels will be at or below 60 MME/day. Use of benzodiazepines** is not recommended.  Exceptions: There are only two exceptions to the rule of not receiving pain medications from other Healthcare Providers. 1. Exception #1 (Emergencies): In the event of an emergency (i.e.: accident requiring emergency care), you are allowed to receive additional pain medication. However, you are responsible for: As soon as you are able, call our office [phone], at any time of the day or night, and leave a message stating your name, the date and nature of the emergency, and the name and dose of the medication prescribed. In the event that your call is answered by a member of our staff, make sure to document and save the date, time, and the name of the person that took your information.  2. Exception #2 (Planned Surgery): In the event that you are scheduled by another doctor or dentist to have any type of surgery or procedure, you are allowed (for a period no longer than 30 days), to receive additional pain  medication, for the acute post-op pain. However, in this case, you are responsible for picking up a copy of our "Post-op Pain Management for Surgeons" handout, and giving it to your surgeon or dentist. This document is available at our office, and does not require an appointment to obtain it. Simply go to our office during business hours (Monday-Thursday from 8:00 AM to 4:00 PM) (Friday 8:00 AM to 12:00 Noon) or if you have a scheduled appointment with [place], prior to your surgery, and ask for it by name. In addition, you will need to provide [place] with your name, name of your surgeon, type of surgery, and date of procedure or surgery.  *Opioid medications include: morphine, codeine, oxycodone, oxymorphone, hydrocodone, hydromorphone, meperidine, tramadol, tapentadol, buprenorphine, fentanyl, methadone. **Benzodiazepine medications include: diazepam (Valium), alprazolam (Xanax), clonazepam (Klonopine), lorazepam (Ativan), clorazepate (Tranxene), chlordiazepoxide (Librium), estazolam (Prosom), oxazepam (Serax), temazepam (Restoril), triazolam (Halcion)  You were given 3 prescriptions for Oxycodone today.  ____________________________________________________________________________________________   ____________________________________________________________________________________________  Preparing for Procedure with Sedation Instructions: . Oral Intake: Do not eat or drink anything for at least 8 hours prior to your procedure. . Transportation: Public transportation is not allowed. Bring an adult driver. The driver must be physically present in our waiting room before any procedure can be started. Marland Kitchen Physical Assistance: Bring an adult physically capable of assisting you, in the event you need help. This adult should keep you company at home for at least 6 hours after the procedure. . Blood Pressure Medicine: Take your blood pressure medicine with a sip of water the morning of the procedure. . Blood  thinners:  . Diabetics on insulin: Notify the staff so that you can be scheduled 1st case in the morning. If your diabetes requires high dose insulin, take only  of your normal insulin dose the morning of  the procedure and notify the staff that you have done so. . Preventing infections: Shower with an antibacterial soap the morning of your procedure. . Build-up your immune system: Take 1000 mg of Vitamin C with every meal (3 times a day) the day prior to your procedure. Marland Kitchen Antibiotics: Inform the staff if you have a condition or reason that requires you to take antibiotics before dental procedures. . Pregnancy: If you are pregnant, call and cancel the procedure. . Sickness: If you have a cold, fever, or any active infections, call and cancel the procedure. . Arrival: You must be in the facility at least 30 minutes prior to your scheduled procedure. . Children: Do not bring children with you. . Dress appropriately: Bring dark clothing that you would not mind if they get stained. . Valuables: Do not bring any jewelry or valuables. Procedure appointments are reserved for interventional treatments only. Marland Kitchen No Prescription Refills. . No medication changes will be discussed during procedure appointments. . No disability issues will be discussed. ____________________________________________________________________________________________

## 2017-07-29 ENCOUNTER — Telehealth: Payer: Self-pay | Admitting: Pain Medicine

## 2017-07-29 NOTE — Telephone Encounter (Signed)
Called pt to let know that a PA was sent 07/25/17  Left message, no answer

## 2017-07-29 NOTE — Telephone Encounter (Signed)
Needs prior auth for meds. Please let patient know when she can pick up

## 2017-08-06 ENCOUNTER — Telehealth: Payer: Self-pay | Admitting: *Deleted

## 2017-08-06 NOTE — Telephone Encounter (Signed)
Call to pharmacy to let them know Medicaid has approved qty 480/82months.  Therefore the qty on oxycodone 10 mg will have to change from qty 120 to qty 80. [facility]acy, [facility] states that she has February and March at there store.  Patient will need to pickup and exchange with [place] for proper qty.

## 2017-08-06 NOTE — Telephone Encounter (Signed)
Patient called to let her know that Medicaid has approved qty of 80.  She will need to bring original Rx to have replaced with new qty amount of 80

## 2017-08-19 ENCOUNTER — Ambulatory Visit: Payer: Medicaid Other | Admitting: Pain Medicine

## 2017-08-19 DIAGNOSIS — M19012 Primary osteoarthritis, left shoulder: Secondary | ICD-10-CM

## 2017-08-19 DIAGNOSIS — M19011 Primary osteoarthritis, right shoulder: Secondary | ICD-10-CM | POA: Insufficient documentation

## 2017-08-19 NOTE — Progress Notes (Deleted)
Patient's Name: Danielle [name]  MRN: [id number]  Referring Provider: Vevelyn Francois, NP  DOB: 03/08/1961  PCP: Marygrace Drought, MD  DOS: 08/19/2017  Note by: Gaspar Cola, MD  Service setting: Ambulatory outpatient  Specialty: Interventional Pain Management  Patient type: Established  Location: [facility] (AMB) Pain Management Facility  Visit type: Interventional Procedure   Primary Reason for Visit: Interventional Pain Management Treatment. CC: No chief complaint on file.  Procedure:  Anesthesia, Analgesia, Anxiolysis:  Type: Diagnostic Suprascapular nerve Block #1  CPT: [phone] Primary Purpose: Diagnostic Region: Posterior Shoulder & Scapular Areas Level: Superior to the scapular spine, in the lateral aspect of the supraspinatus fossa (Suprescapular notch). Target Area: Suprascapular nerve as it passes thru the lower portion of the suprascapular notch. Approach: Posterior percutaneous approach. Laterality: Left-Side Position: Prone  Type: Local Anesthesia with Moderate (Conscious) Sedation Local Anesthetic: Lidocaine 1% Route: Intravenous (IV) IV Access: Secured Sedation: Meaningful verbal contact was maintained at all times during the procedure  Indication(s): Analgesia and Anxiety   Indications: 1. Chronic shoulder pain (Location of Primary Source of Pain) (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)   2. Osteoarthritis of shoulder (Bilateral)    Pain Score: Pre-procedure:  /10 Post-procedure:  /10  Pre-op Assessment:  Danielle [name] is a 57 y.o. (year old), female patient, seen today for interventional treatment. She  has a past surgical history that includes necksurgery (10/21/2014); Back surgery (2013); Foot surgery (Right); Elbow surgery (Left); Carpal tunnel release (Right); Nose surgery; Partial hysterectomy; Total shoulder replacement (Bilateral); and Colonoscopy with propofol (N/A, 02/05/2017). Danielle [name] has a current medication list which includes the following prescription(s): albuterol, biotin,  cetirizine, vitamin d3, citalopram, cyanocobalamin, cyclobenzaprine, diclofenac sodium, docusate sodium, levothyroxine, loratadine, magnesium hydroxide, montelukast, naloxone, nystatin, oxycodone hcl, oxycodone hcl, oxycodone hcl, spiriva handihaler, and tizanidine. Her primarily concern today is the No chief complaint on file.  Initial Vital Signs:  Pulse Rate:   Temp:   Resp:   BP:   SpO2:    BMI: Estimated body mass index is 40.27 kg/m as calculated from the following:   Height as of 07/24/17: 5\' 5"  (1.651 m).   Weight as of 07/24/17: 242 lb (109.8 kg).  Risk Assessment: Allergies: Reviewed. She is allergic to aripiprazole; duloxetine; gabapentin; gold; iodinated diagnostic agents; naproxen sodium; nortriptyline hcl; nsaids; pregabalin; ace inhibitors; aspirin; cefpodoxime; fluoxetine; fluticasone-salmeterol; levofloxacin; lithium; meperidine; paroxetine; tape; telithromycin; theophylline; topiramate; trazodone; triamcinolone; venlafaxine; bupropion; ketorolac tromethamine; morphine; and moxifloxacin.  Allergy Precautions: No iodine containing solutions or radiological contrast used. Coagulopathies: Reviewed. None identified.  Blood-thinner therapy: None at this time Active Infection(s): Reviewed. None identified. Danielle [name] is afebrile  Site Confirmation: Danielle [name] was asked to confirm the procedure and laterality before marking the site Procedure checklist: Completed Consent: Before the procedure and under the influence of no sedative(s), amnesic(s), or anxiolytics, the patient was informed of the treatment options, risks and possible complications. To fulfill our ethical and legal obligations, as recommended by the American Medical Association's Code of Ethics, I have informed the patient of my clinical impression; the nature and purpose of the treatment or procedure; the risks, benefits, and possible complications of the intervention; the alternatives, including doing nothing; the  risk(s) and benefit(s) of the alternative treatment(s) or procedure(s); and the risk(s) and benefit(s) of doing nothing. The patient was provided information about the general risks and possible complications associated with the procedure. These may include, but are not limited to: failure to achieve desired goals, infection, bleeding, organ or nerve damage, allergic reactions, paralysis,  and death. In addition, the patient was informed of those risks and complications associated to the procedure, such as failure to decrease pain; infection; bleeding; organ or nerve damage with subsequent damage to sensory, motor, and/or autonomic systems, resulting in permanent pain, numbness, and/or weakness of one or several areas of the body; allergic reactions; (i.e.: anaphylactic reaction); and/or death. Furthermore, the patient was informed of those risks and complications associated with the medications. These include, but are not limited to: allergic reactions (i.e.: anaphylactic or anaphylactoid reaction(s)); adrenal axis suppression; blood sugar elevation that in diabetics may result in ketoacidosis or comma; water retention that in patients with history of congestive heart failure may result in shortness of breath, pulmonary edema, and decompensation with resultant heart failure; weight gain; swelling or edema; medication-induced neural toxicity; particulate matter embolism and blood vessel occlusion with resultant organ, and/or nervous system infarction; and/or aseptic necrosis of one or more joints. Finally, the patient was informed that Medicine is not an exact science; therefore, there is also the possibility of unforeseen or unpredictable risks and/or possible complications that may result in a catastrophic outcome. The patient indicated having understood very clearly. We have given the patient no guarantees and we have made no promises. Enough time was given to the patient to ask questions, all of which were  answered to the patient's satisfaction. Danielle [name] has indicated that she wanted to continue with the procedure. Attestation: I, the ordering provider, attest that I have discussed with the patient the benefits, risks, side-effects, alternatives, likelihood of achieving goals, and potential problems during recovery for the procedure that I have provided informed consent. Date: 08/19/2017  Time: N/A  Pre-Procedure Preparation:  Monitoring: As per clinic protocol. Respiration, ETCO2, SpO2, BP, heart rate and rhythm monitor placed and checked for adequate function Safety Precautions: Patient was assessed for positional comfort and pressure points before starting the procedure. Time-out: I initiated and conducted the "Time-out" before starting the procedure, as per protocol. The patient was asked to participate by confirming the accuracy of the "Time Out" information. Verification of the correct person, site, and procedure were performed and confirmed by me, the nursing staff, and the patient. "Time-out" conducted as per Joint Commission's Universal Protocol (UP.01.01.01). "Time-out" Date & Time: 08/19/2017;   hrs.  Description of Procedure Process:   Area Prepped: Entire shoulder Area Prepping solution: ChloraPrep (2% chlorhexidine gluconate and 70% isopropyl alcohol) Safety Precautions: Aspiration looking for blood return was conducted prior to all injections. At no point did we inject any substances, as a needle was being advanced. No attempts were made at seeking any paresthesias. Safe injection practices and needle disposal techniques used. Medications properly checked for expiration dates. SDV (single dose vial) medications used. Description of the Procedure: Protocol guidelines were followed. The patient was placed in position over the procedure table. The target area was identified and the area prepped in the usual manner. Skin & deeper tissues infiltrated with local anesthetic. Appropriate amount of  time allowed to pass for local anesthetics to take effect. The procedure needles were then advanced to the target area. Proper needle placement secured. Negative aspiration confirmed. Solution injected in intermittent fashion, asking for systemic symptoms every 0.5cc of injectate. The needles were then removed and the area cleansed, making sure to leave some of the prepping solution back to take advantage of its long term bactericidal properties.  There were no vitals filed for this visit.  Start Time:   hrs. End Time:   hrs. Materials:  Needle(s)  Type: Regular needle Gauge: 22G Length: 3.5-in Medication(s): Virdie A. Domingo Cocking "ANGIE" had no medications administered during this visit. Please see chart orders for dosing details.  Imaging Guidance (Non-Spinal):  Type of Imaging Technique: Fluoroscopy Guidance (Non-Spinal) Indication(s): Assistance in needle guidance and placement for procedures requiring needle placement in or near specific anatomical locations not easily accessible without such assistance. Exposure Time: Please see nurses notes. Contrast: Before injecting any contrast, we confirmed that the patient did not have an allergy to iodine, shellfish, or radiological contrast. Once satisfactory needle placement was completed at the desired level, radiological contrast was injected. Contrast injected under live fluoroscopy. No contrast complications. See chart for type and volume of contrast used. Fluoroscopic Guidance: I was personally present during the use of fluoroscopy. "Tunnel Vision Technique" used to obtain the best possible view of the target area. Parallax error corrected before commencing the procedure. "Direction-depth-direction" technique used to introduce the needle under continuous pulsed fluoroscopy. Once target was reached, antero-posterior, oblique, and lateral fluoroscopic projection used confirm needle placement in all planes. Images permanently stored in  EMR. Interpretation: I personally interpreted the imaging intraoperatively. Adequate needle placement confirmed in multiple planes. Appropriate spread of contrast into desired area was observed. No evidence of afferent or efferent intravascular uptake. Permanent images saved into the patient's record.  Antibiotic Prophylaxis:  Indication(s): None identified Antibiotic given: None  Post-operative Assessment:  Post-procedure Vital Signs:  Pulse Rate:   Temp:   Resp:   BP:   SpO2:    EBL: None  Complications: No immediate post-treatment complications observed by team, or reported by patient.  Note: The patient tolerated the entire procedure well. A repeat set of vitals were taken after the procedure and the patient was kept under observation following institutional policy, for this type of procedure. Post-procedural neurological assessment was performed, showing return to baseline, prior to discharge. The patient was provided with post-procedure discharge instructions, including a section on how to identify potential problems. Should any problems arise concerning this procedure, the patient was given instructions to immediately contact us, at any time, without hesitation. In any case, we plan to contact the patient by telephone for a follow-up status report regarding this interventional procedure.  Comments:  No additional relevant information.  Plan of Care   Imaging Orders  No imaging studies ordered today   Procedure Orders    No procedure(s) ordered today    Medications ordered for procedure: No orders of the defined types were placed in this encounter.  Medications administered: Syrianna A. Domingo Cocking "ANGIE" had no medications administered during this visit.  See the medical record for exact dosing, route, and time of administration.  New Prescriptions   No medications on file   Disposition: Discharge home  Discharge Date & Time: 08/19/2017;   hrs.   Physician-requested  Follow-up: No Follow-up on file.  Future Appointments  Date Time Provider [place]  08/19/2017  1:15 PM Milinda Pointer, MD [facility] None  10/22/2017  2:30 PM Vevelyn Francois, NP [facility] None   Primary Care Physician: Marygrace Drought, MD Location: [facility] Note by: Gaspar Cola, MD Date: 08/19/2017; Time: 7:42 AM  Disclaimer:  Medicine is not an Chief Strategy Officer. The only guarantee in medicine is that nothing is guaranteed. It is important to note that the decision to proceed with this intervention was based on the information collected from the patient. The Data and conclusions were drawn from the patient's questionnaire, the interview, and the physical examination. Because  the information was provided in large part by the patient, it cannot be guaranteed that it has not been purposely or unconsciously manipulated. Every effort has been made to obtain as much relevant data as possible for this evaluation. It is important to note that the conclusions that lead to this procedure are derived in large part from the available data. Always take into account that the treatment will also be dependent on availability of resources and existing treatment guidelines, considered by other Pain Management Practitioners as being common knowledge and practice, at the time of the intervention. For Medico-Legal purposes, it is also important to point out that variation in procedural techniques and pharmacological choices are the acceptable norm. The indications, contraindications, technique, and results of the above procedure should only be interpreted and judged by a Board-Certified Interventional Pain Specialist with extensive familiarity and expertise in the same exact procedure and technique.  Patient's Name: Danielle [name]  MRN: [id number]  Referring Provider: Vevelyn Francois, NP  DOB: [birth year]  PCP: Marygrace Drought, MD  DOS: 08/19/2017  Note by: Gaspar Cola, MD  Service setting: Ambulatory outpatient  Specialty: Interventional Pain Management  Patient type: Established  Location: [facility] (AMB) Pain Management Facility  Visit type: Interventional Procedure   Primary Reason for Visit: Interventional Pain Management Treatment. CC: No chief complaint on file.  Procedure:  Anesthesia, Analgesia, Anxiolysis:  Type: Diagnostic Glenohumeral Joint (shoulder) Injection #1  CPT: 20610      Primary Purpose: Diagnostic Region: Superior Shoulder Area Level:  Shoulder Target Area: Glenohumeral Joint (shoulder) Approach: Posterior approach. Laterality: Left Position: Prone  Type: Local Anesthesia with Moderate (Conscious) Sedation Local Anesthetic: Lidocaine 1% Route: Intravenous (IV) IV Access: Secured Sedation: Meaningful verbal contact was maintained at all times during the procedure  Indication(s): Analgesia and Anxiety   Indications: 1. Chronic shoulder pain (Location of Primary Source of Pain) (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)   2. Osteoarthritis of shoulder (Bilateral)    Pain Score: Pre-procedure:  /10 Post-procedure:  /10  Pre-op Assessment:  Ms. Ciolek is a 57 y.o. (year old), female patient, seen today for interventional treatment. She  has a past surgical history that includes necksurgery (10/21/2014); Back surgery (2013); Foot surgery (Right); Elbow surgery (Left); Carpal tunnel release (Right); Nose surgery; Partial hysterectomy; Total shoulder replacement (Bilateral); and Colonoscopy with propofol (N/A, 02/05/2017). Ms. Burlison has a current medication list which includes the following prescription(s): albuterol, biotin, cetirizine, vitamin d3, citalopram, cyanocobalamin, cyclobenzaprine, diclofenac sodium, docusate sodium, levothyroxine, loratadine, magnesium hydroxide, montelukast, naloxone, nystatin, oxycodone hcl, oxycodone hcl, oxycodone hcl, spiriva handihaler, and tizanidine. Her primarily concern today is the No chief complaint on  file.  Initial Vital Signs:  Pulse Rate:   Temp:   Resp:   BP:   SpO2:    BMI: Estimated body mass index is 40.27 kg/m as calculated from the following:   Height as of 07/24/17: 5\' 5"  (1.651 m).   Weight as of 07/24/17: 242 lb (109.8 kg).  Risk Assessment: Allergies: Reviewed. She is allergic to aripiprazole; duloxetine; gabapentin; gold; iodinated diagnostic agents; naproxen sodium; nortriptyline hcl; nsaids; pregabalin; ace inhibitors; aspirin; cefpodoxime; fluoxetine; fluticasone-salmeterol; levofloxacin; lithium; meperidine; paroxetine; tape; telithromycin; theophylline; topiramate; trazodone; triamcinolone; venlafaxine; bupropion; ketorolac tromethamine; morphine; and moxifloxacin.  Allergy Precautions: No iodine containing solutions or radiological contrast used. Coagulopathies: Reviewed. None identified.  Blood-thinner therapy: None at this time Active Infection(s): Reviewed. None identified. Ms. Cuthrell is afebrile  Site Confirmation: Ms. Junker was asked to confirm the procedure and laterality before marking the  site Procedure checklist: Completed Consent: Before the procedure and under the influence of no sedative(s), amnesic(s), or anxiolytics, the patient was informed of the treatment options, risks and possible complications. To fulfill our ethical and legal obligations, as recommended by the American Medical Association's Code of Ethics, I have informed the patient of my clinical impression; the nature and purpose of the treatment or procedure; the risks, benefits, and possible complications of the intervention; the alternatives, including doing nothing; the risk(s) and benefit(s) of the alternative treatment(s) or procedure(s); and the risk(s) and benefit(s) of doing nothing. The patient was provided information about the general risks and possible complications associated with the procedure. These may include, but are not limited to: failure to achieve desired goals, infection,  bleeding, organ or nerve damage, allergic reactions, paralysis, and death. In addition, the patient was informed of those risks and complications associated to the procedure, such as failure to decrease pain; infection; bleeding; organ or nerve damage with subsequent damage to sensory, motor, and/or autonomic systems, resulting in permanent pain, numbness, and/or weakness of one or several areas of the body; allergic reactions; (i.e.: anaphylactic reaction); and/or death. Furthermore, the patient was informed of those risks and complications associated with the medications. These include, but are not limited to: allergic reactions (i.e.: anaphylactic or anaphylactoid reaction(s)); adrenal axis suppression; blood sugar elevation that in diabetics may result in ketoacidosis or comma; water retention that in patients with history of congestive heart failure may result in shortness of breath, pulmonary edema, and decompensation with resultant heart failure; weight gain; swelling or edema; medication-induced neural toxicity; particulate matter embolism and blood vessel occlusion with resultant organ, and/or nervous system infarction; and/or aseptic necrosis of one or more joints. Finally, the patient was informed that Medicine is not an exact science; therefore, there is also the possibility of unforeseen or unpredictable risks and/or possible complications that may result in a catastrophic outcome. The patient indicated having understood very clearly. We have given the patient no guarantees and we have made no promises. Enough time was given to the patient to ask questions, all of which were answered to the patient's satisfaction. Ms. Bolte has indicated that she wanted to continue with the procedure. Attestation: I, the ordering provider, attest that I have discussed with the patient the benefits, risks, side-effects, alternatives, likelihood of achieving goals, and potential problems during recovery for the  procedure that I have provided informed consent. Date: 08/19/2017  Time: N/A  Pre-Procedure Preparation:  Monitoring: As per clinic protocol. Respiration, ETCO2, SpO2, BP, heart rate and rhythm monitor placed and checked for adequate function Safety Precautions: Patient was assessed for positional comfort and pressure points before starting the procedure. Time-out: I initiated and conducted the "Time-out" before starting the procedure, as per protocol. The patient was asked to participate by confirming the accuracy of the "Time Out" information. Verification of the correct person, site, and procedure were performed and confirmed by me, the nursing staff, and the patient. "Time-out" conducted as per Joint Commission's Universal Protocol (UP.01.01.01). "Time-out" Date & Time: 08/19/2017;   hrs.  Description of Procedure Process:   Area Prepped: Entire shoulder Area Prepping solution: ChloraPrep (2% chlorhexidine gluconate and 70% isopropyl alcohol) Safety Precautions: Aspiration looking for blood return was conducted prior to all injections. At no point did we inject any substances, as a needle was being advanced. No attempts were made at seeking any paresthesias. Safe injection practices and needle disposal techniques used. Medications properly checked for expiration dates. SDV (single dose vial)  medications used. Description of the Procedure: Protocol guidelines were followed. The patient was placed in position over the procedure table. The target area was identified and the area prepped in the usual manner. Skin & deeper tissues infiltrated with local anesthetic. Appropriate amount of time allowed to pass for local anesthetics to take effect. The procedure needles were then advanced to the target area. Proper needle placement secured. Negative aspiration confirmed. Solution injected in intermittent fashion, asking for systemic symptoms every 0.5cc of injectate. The needles were then removed and the area  cleansed, making sure to leave some of the prepping solution back to take advantage of its long term bactericidal properties.  There were no vitals filed for this visit.  Start Time:   hrs. End Time:   hrs. Materials:  Needle(s) Type: Regular needle Gauge: 22G Length: 3.5-in Medication(s): Lisl A. Domingo Cocking "ANGIE" had no medications administered during this visit. Please see chart orders for dosing details.  Imaging Guidance (Non-Spinal):  Type of Imaging Technique: Fluoroscopy Guidance (Non-Spinal) Indication(s): Assistance in needle guidance and placement for procedures requiring needle placement in or near specific anatomical locations not easily accessible without such assistance. Exposure Time: Please see nurses notes. Contrast: None used. Fluoroscopic Guidance: I was personally present during the use of fluoroscopy. "Tunnel Vision Technique" used to obtain the best possible view of the target area. Parallax error corrected before commencing the procedure. "Direction-depth-direction" technique used to introduce the needle under continuous pulsed fluoroscopy. Once target was reached, antero-posterior, oblique, and lateral fluoroscopic projection used confirm needle placement in all planes. Images permanently stored in EMR. Interpretation: No contrast injected. I personally interpreted the imaging intraoperatively. Adequate needle placement confirmed in multiple planes. Permanent images saved into the patient's record.  Antibiotic Prophylaxis:  Indication(s): None identified Antibiotic given: None  Post-operative Assessment:  Post-procedure Vital Signs:  Pulse Rate:   Temp:   Resp:   BP:   SpO2:    EBL: None  Complications: No immediate post-treatment complications observed by team, or reported by patient.  Note: The patient tolerated the entire procedure well. A repeat set of vitals were taken after the procedure and the patient was kept under observation following  institutional policy, for this type of procedure. Post-procedural neurological assessment was performed, showing return to baseline, prior to discharge. The patient was provided with post-procedure discharge instructions, including a section on how to identify potential problems. Should any problems arise concerning this procedure, the patient was given instructions to immediately contact us, at any time, without hesitation. In any case, we plan to contact the patient by telephone for a follow-up status report regarding this interventional procedure.  Comments:  No additional relevant information.  Plan of Care   Imaging Orders  No imaging studies ordered today   Procedure Orders    No procedure(s) ordered today    Medications ordered for procedure: No orders of the defined types were placed in this encounter.  Medications administered: Lanita A. Domingo Cocking "ANGIE" had no medications administered during this visit.  See the medical record for exact dosing, route, and time of administration.  New Prescriptions   No medications on file   Disposition: Discharge home  Discharge Date & Time: 08/19/2017;   hrs.   Physician-requested Follow-up: No Follow-up on file.  Future Appointments  Date Time Provider [place]  08/19/2017  1:15 PM Milinda Pointer, MD [facility] None  10/22/2017  2:30 PM Vevelyn Francois, NP [facility] None   Primary Care Physician: Marygrace Drought, MD Location: [facility] Outpatient  Pain Management Facility Note by: Gaspar Cola, MD Date: 08/19/2017; Time: 8:10 AM  Disclaimer:  Medicine is not an Chief Strategy Officer. The only guarantee in medicine is that nothing is guaranteed. It is important to note that the decision to proceed with this intervention was based on the information collected from the patient. The Data and conclusions were drawn from the patient's questionnaire, the interview, and the physical examination. Because the information was provided in large  part by the patient, it cannot be guaranteed that it has not been purposely or unconsciously manipulated. Every effort has been made to obtain as much relevant data as possible for this evaluation. It is important to note that the conclusions that lead to this procedure are derived in large part from the available data. Always take into account that the treatment will also be dependent on availability of resources and existing treatment guidelines, considered by other Pain Management Practitioners as being common knowledge and practice, at the time of the intervention. For Medico-Legal purposes, it is also important to point out that variation in procedural techniques and pharmacological choices are the acceptable norm. The indications, contraindications, technique, and results of the above procedure should only be interpreted and judged by a Board-Certified Interventional Pain Specialist with extensive familiarity and expertise in the same exact procedure and technique.

## 2017-08-21 ENCOUNTER — Ambulatory Visit: Payer: Self-pay | Admitting: Pain Medicine

## 2017-08-21 ENCOUNTER — Telehealth: Payer: Self-pay

## 2017-08-21 NOTE — Telephone Encounter (Signed)
Pt was instructed by pharmacy to bring  meds back to [place], can you call and verify

## 2017-08-22 NOTE — Telephone Encounter (Signed)
Patient notified of above.  States she will be out of meds on Saturday and may choose to pay for the extra 20 tablets.

## 2017-08-25 ENCOUNTER — Other Ambulatory Visit: Payer: Self-pay | Admitting: Nurse Practitioner

## 2017-08-25 ENCOUNTER — Telehealth: Payer: Self-pay | Admitting: Nurse Practitioner

## 2017-08-25 DIAGNOSIS — G894 Chronic pain syndrome: Secondary | ICD-10-CM

## 2017-08-25 MED ORDER — OXYCODONE HCL 10 MG PO TABS: 10.0000 mg | ORAL_TABLET | Freq: Four times a day (QID) | ORAL | 0 refills | Status: DC | PRN

## 2017-08-25 NOTE — Telephone Encounter (Signed)
Spoke with patient re; Rx.  She states that medicaid would only cover qty of 80 and Rx is written for qty 120.  Pharmacist wrote on the original Rx and states now it is void and patient will need to get another Rx.  Patient states rather than mess around with medicaid she is going to pay for the medication out of pocket.  Medicaid did cover qty 120 for January fill.  I told patient that I felt it would be worth her while to contact [employer] to find out what is going on with approval.  Meanwhile patient states that she needs another Rx to replace the February and March fill.

## 2017-08-25 NOTE — Telephone Encounter (Signed)
She will need to bring those in I will reprint Thanks

## 2017-08-25 NOTE — Telephone Encounter (Signed)
Patient has tried to get scripts filled, medicaid says they will only pay for 80, and pharmacy wrote return to provider on scripts so patient cannot use these scripts now. Patient is going to pay for scripts herself, if you could reprint her scripts as they were written before, she is going to pay for meds herself,  she will bring old ones back in. Please call patient.

## 2017-08-25 NOTE — Telephone Encounter (Signed)
I spoke to the patient this morning, she said she is going to take the script to [facility] because they are 40 dollars cheaper than her pharmacy. Just an [place]

## 2017-08-25 NOTE — Telephone Encounter (Signed)
Oxycodone 10 mg x 2 months for February and march replaced d/t pharmacy writing on the originals.

## 2017-09-01 NOTE — Progress Notes (Signed)
Patient's Name: Danielle [name]  MRN: [id number]  Referring Provider: Marygrace Drought, MD  DOB: March 08, 1961  PCP: Marygrace Drought, MD  DOS: 09/02/2017  Note by: Gaspar Cola, MD  Service setting: Ambulatory outpatient  Specialty: Interventional Pain Management  Patient type: Established  Location: [facility] (AMB) Pain Management Facility  Visit type: Interventional Procedure   Primary Reason for Visit: Interventional Pain Management Treatment. CC: Shoulder Pain (bilateral)  Procedure:  Anesthesia, Analgesia, Anxiolysis:  Type: Diagnostic Suprascapular nerve Block #2  CPT: [phone] Primary Purpose: Diagnostic Region: Posterior Shoulder & Scapular Areas Level: Superior to the scapular spine, in the lateral aspect of the supraspinatus fossa (Suprescapular notch). Target Area: Suprascapular nerve as it passes thru the lower portion of the suprascapular notch. Approach: Posterior percutaneous approach. Laterality: Bilateral Position: Prone  Type: Local Anesthesia with Moderate (Conscious) Sedation Local Anesthetic: Lidocaine 1% Route: Intravenous (IV) IV Access: Secured Sedation: Meaningful verbal contact was maintained at all times during the procedure  Indication(s): Analgesia and Anxiety   Indications: 1. Chronic shoulder pain (Location of Primary Source of Pain) (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)   2. Osteoarthritis of shoulder (Bilateral)   3. S/P total replacement of shoulder (Right)    Pain Score: Pre-procedure: 4 /10 Post-procedure: 0-No pain/10  Pre-op Assessment:  Danielle [name] is a 57 y.o. (year old), female patient, seen today for interventional treatment. She  has a past surgical history that includes necksurgery (10/21/2014); Back surgery (2013); Foot surgery (Right); Elbow surgery (Left); Carpal tunnel release (Right); Nose surgery; Partial hysterectomy; Total shoulder replacement (Bilateral); and Colonoscopy with propofol (N/A, 02/05/2017). Danielle [name] has a current medication list which  includes the following prescription(s): albuterol, biotin, vitamin d3, citalopram, cyanocobalamin, diclofenac sodium, docusate sodium, levothyroxine, loratadine, magnesium hydroxide, montelukast, naloxone, nystatin, oxycodone hcl, oxycodone hcl, spiriva handihaler, cetirizine, cyclobenzaprine, and oxycodone hcl, and the following Facility-Administered Medications: fentanyl and midazolam. Her primarily concern today is the Shoulder Pain (bilateral)  Initial Vital Signs:  Pulse Rate: 79 Temp: 98.7 F (37.1 C) Resp: 12 BP: (!) 146/89 SpO2: 93 %  BMI: Estimated body mass index is 40.6 kg/m as calculated from the following:   Height as of this encounter: 5\' 5"  (1.651 m).   Weight as of this encounter: 244 lb (110.7 kg).  Risk Assessment: Allergies: Reviewed. She is allergic to aripiprazole; duloxetine; gabapentin; gold; iodinated diagnostic agents; naproxen sodium; nortriptyline hcl; nsaids; pregabalin; ace inhibitors; aspirin; cefpodoxime; fluoxetine; fluticasone-salmeterol; levofloxacin; lithium; meperidine; paroxetine; tape; telithromycin; theophylline; topiramate; trazodone; triamcinolone; venlafaxine; bupropion; ketorolac tromethamine; morphine; and moxifloxacin.  Allergy Precautions: No iodine containing solutions or radiological contrast used. Coagulopathies: Reviewed. None identified.  Blood-thinner therapy: None at this time Active Infection(s): Reviewed. None identified. Danielle [name] is afebrile  Site Confirmation: Danielle [name] was asked to confirm the procedure and laterality before marking the site Procedure checklist: Completed Consent: Before the procedure and under the influence of no sedative(s), amnesic(s), or anxiolytics, the patient was informed of the treatment options, risks and possible complications. To fulfill our ethical and legal obligations, as recommended by the American Medical Association's Code of Ethics, I have informed the patient of my clinical impression; the  nature and purpose of the treatment or procedure; the risks, benefits, and possible complications of the intervention; the alternatives, including doing nothing; the risk(s) and benefit(s) of the alternative treatment(s) or procedure(s); and the risk(s) and benefit(s) of doing nothing. The patient was provided information about the general risks and possible complications associated with the procedure. These may include, but are not limited to: failure  to achieve desired goals, infection, bleeding, organ or nerve damage, allergic reactions, paralysis, and death. In addition, the patient was informed of those risks and complications associated to the procedure, such as failure to decrease pain; infection; bleeding; organ or nerve damage with subsequent damage to sensory, motor, and/or autonomic systems, resulting in permanent pain, numbness, and/or weakness of one or several areas of the body; allergic reactions; (i.e.: anaphylactic reaction); and/or death. Furthermore, the patient was informed of those risks and complications associated with the medications. These include, but are not limited to: allergic reactions (i.e.: anaphylactic or anaphylactoid reaction(s)); adrenal axis suppression; blood sugar elevation that in diabetics may result in ketoacidosis or comma; water retention that in patients with history of congestive heart failure may result in shortness of breath, pulmonary edema, and decompensation with resultant heart failure; weight gain; swelling or edema; medication-induced neural toxicity; particulate matter embolism and blood vessel occlusion with resultant organ, and/or nervous system infarction; and/or aseptic necrosis of one or more joints. Finally, the patient was informed that Medicine is not an exact science; therefore, there is also the possibility of unforeseen or unpredictable risks and/or possible complications that may result in a catastrophic outcome. The patient indicated having  understood very clearly. We have given the patient no guarantees and we have made no promises. Enough time was given to the patient to ask questions, all of which were answered to the patient's satisfaction. Ms. Nessel has indicated that she wanted to continue with the procedure. Attestation: I, the ordering provider, attest that I have discussed with the patient the benefits, risks, side-effects, alternatives, likelihood of achieving goals, and potential problems during recovery for the procedure that I have provided informed consent. Date  Time: 09/02/2017  9:20 AM  Pre-Procedure Preparation:  Monitoring: As per clinic protocol. Respiration, ETCO2, SpO2, BP, heart rate and rhythm monitor placed and checked for adequate function Safety Precautions: Patient was assessed for positional comfort and pressure points before starting the procedure. Time-out: I initiated and conducted the "Time-out" before starting the procedure, as per protocol. The patient was asked to participate by confirming the accuracy of the "Time Out" information. Verification of the correct person, site, and procedure were performed and confirmed by me, the nursing staff, and the patient. "Time-out" conducted as per Joint Commission's Universal Protocol (UP.01.01.01). Time: 1038  Description of Procedure Process:   Area Prepped: Entire shoulder Area Prepping solution: ChloraPrep (2% chlorhexidine gluconate and 70% isopropyl alcohol) Safety Precautions: Aspiration looking for blood return was conducted prior to all injections. At no point did we inject any substances, as a needle was being advanced. No attempts were made at seeking any paresthesias. Safe injection practices and needle disposal techniques used. Medications properly checked for expiration dates. SDV (single dose vial) medications used. Description of the Procedure: Protocol guidelines were followed. The patient was placed in position over the procedure table. The  target area was identified and the area prepped in the usual manner. Skin & deeper tissues infiltrated with local anesthetic. Appropriate amount of time allowed to pass for local anesthetics to take effect. The procedure needles were then advanced to the target area. Proper needle placement secured. Negative aspiration confirmed. Solution injected in intermittent fashion, asking for systemic symptoms every 0.5cc of injectate. The needles were then removed and the area cleansed, making sure to leave some of the prepping solution back to take advantage of its long term bactericidal properties.  Vitals:   09/02/17 1046 09/02/17 1051 09/02/17 1103 09/02/17 1114  BP:  125/89 112/72 (!) 125/99 (!) 131/54  Pulse:      Resp: 14 10 (!) 7 15  Temp:  97.6 F (36.4 C)    TempSrc:  Temporal    SpO2: 98% 96% 97% 99%  Weight:      Height:        Start Time: 1038 hrs. End Time: 1045 hrs. Materials:  Needle(s) Type: Regular needle Gauge: 22G Length: 3.5-in Medication(s): Please see orders for medications and dosing details.  Imaging Guidance (Non-Spinal):  Type of Imaging Technique: Fluoroscopy Guidance (Non-Spinal) Indication(s): Assistance in needle guidance and placement for procedures requiring needle placement in or near specific anatomical locations not easily accessible without such assistance. Exposure Time: Please see nurses notes. Contrast: Before injecting any contrast, we confirmed that the patient did not have an allergy to iodine, shellfish, or radiological contrast. Once satisfactory needle placement was completed at the desired level, radiological contrast was injected. Contrast injected under live fluoroscopy. No contrast complications. See chart for type and volume of contrast used. Fluoroscopic Guidance: I was personally present during the use of fluoroscopy. "Tunnel Vision Technique" used to obtain the best possible view of the target area. Parallax error corrected before commencing the  procedure. "Direction-depth-direction" technique used to introduce the needle under continuous pulsed fluoroscopy. Once target was reached, antero-posterior, oblique, and lateral fluoroscopic projection used confirm needle placement in all planes. Images permanently stored in EMR. Interpretation: I personally interpreted the imaging intraoperatively. Adequate needle placement confirmed in multiple planes. Appropriate spread of contrast into desired area was observed. No evidence of afferent or efferent intravascular uptake. Permanent images saved into the patient's record.  Antibiotic Prophylaxis:   Anti-infectives (From admission, onward)   None     Indication(s): None identified  Post-operative Assessment:  Post-procedure Vital Signs:  Pulse Rate: 79 Temp: 97.6 F (36.4 C) Resp: 15 BP: (!) 131/54 SpO2: 99 %  EBL: None  Complications: No immediate post-treatment complications observed by team, or reported by patient.  Note: The patient tolerated the entire procedure well. A repeat set of vitals were taken after the procedure and the patient was kept under observation following institutional policy, for this type of procedure. Post-procedural neurological assessment was performed, showing return to baseline, prior to discharge. The patient was provided with post-procedure discharge instructions, including a section on how to identify potential problems. Should any problems arise concerning this procedure, the patient was given instructions to immediately contact us, at any time, without hesitation. In any case, we plan to contact the patient by telephone for a follow-up status report regarding this interventional procedure.  Comments:  No additional relevant information.  Plan of Care    Imaging Orders     DG C-Arm 1-60 Min-No Report  Procedure Orders     SUPRASCAPULAR NERVE BLOCK  Medications ordered for procedure: Meds ordered this encounter  Medications  . midazolam  (VERSED) 5 MG/5ML injection 1-2 mg    Make sure Flumazenil is available in the pyxis when using this medication. If oversedation occurs, administer 0.2 mg IV over 15 sec. If after 45 sec no response, administer 0.2 mg again over 1 min; may repeat at 1 min intervals; not to exceed 4 doses (1 mg)  . fentaNYL (SUBLIMAZE) injection 25-50 mcg    Make sure Narcan is available in the pyxis when using this medication. In the event of respiratory depression (RR< 8/min): Titrate NARCAN (naloxone) in increments of 0.1 to 0.2 mg IV at 2-3 minute intervals, until desired degree of reversal.  .  lactated ringers infusion 1,000 mL  . lidocaine (XYLOCAINE) 2 % (with pres) injection 200 mg  . ropivacaine (PF) 2 mg/mL (0.2%) (NAROPIN) injection 9 mL  . methylPREDNISolone acetate (DEPO-MEDROL) injection 80 mg   Medications administered: We administered midazolam, fentaNYL, lactated ringers, lidocaine, ropivacaine (PF) 2 mg/mL (0.2%), and methylPREDNISolone acetate.  See the medical record for exact dosing, route, and time of administration.  New Prescriptions   No medications on file   Disposition: Discharge home  Discharge Date & Time: 09/02/2017; 1145 hrs.   Physician-requested Follow-up: Return for post-procedure eval (2 wks), w/ Dr. [name].  Future Appointments  Date Time Provider [place]  09/15/2017  2:00 PM Milinda Pointer, MD [facility] None  10/22/2017  2:30 PM Vevelyn Francois, NP [facility] None   Primary Care Physician: Marygrace Drought, MD Location: [facility] Outpatient Pain Management Facility Note by: Gaspar Cola, MD Date: 09/02/2017; Time: 12:02 PM  Disclaimer:  Medicine is not an Chief Strategy Officer. The only guarantee in medicine is that nothing is guaranteed. It is important to note that the decision to proceed with this intervention was based on the information collected from the patient. The Data and conclusions were drawn from the patient's questionnaire, the interview, and the  physical examination. Because the information was provided in large part by the patient, it cannot be guaranteed that it has not been purposely or unconsciously manipulated. Every effort has been made to obtain as much relevant data as possible for this evaluation. It is important to note that the conclusions that lead to this procedure are derived in large part from the available data. Always take into account that the treatment will also be dependent on availability of resources and existing treatment guidelines, considered by other Pain Management Practitioners as being common knowledge and practice, at the time of the intervention. For Medico-Legal purposes, it is also important to point out that variation in procedural techniques and pharmacological choices are the acceptable norm. The indications, contraindications, technique, and results of the above procedure should only be interpreted and judged by a Board-Certified Interventional Pain Specialist with extensive familiarity and expertise in the same exact procedure and technique.

## 2017-09-02 ENCOUNTER — Ambulatory Visit
Admission: RE | Admit: 2017-09-02 | Discharge: 2017-09-02 | Disposition: A | Discharge status: Home / Self Care | Payer: Medicaid Other | Source: Ambulatory Visit | Attending: Pain Medicine | Admitting: Pain Medicine

## 2017-09-02 ENCOUNTER — Other Ambulatory Visit: Payer: Self-pay

## 2017-09-02 ENCOUNTER — Ambulatory Visit: Payer: Medicaid Other | Admitting: Pain Medicine

## 2017-09-02 ENCOUNTER — Encounter: Payer: Self-pay | Admitting: Pain Medicine

## 2017-09-02 VITALS — BP 131/54 | HR 79 | Temp 97.6°F | Resp 15 | Ht 65.0 in | Wt 244.0 lb

## 2017-09-02 DIAGNOSIS — M19012 Primary osteoarthritis, left shoulder: Secondary | ICD-10-CM | POA: Insufficient documentation

## 2017-09-02 DIAGNOSIS — M19011 Primary osteoarthritis, right shoulder: Secondary | ICD-10-CM | POA: Insufficient documentation

## 2017-09-02 DIAGNOSIS — M25512 Pain in left shoulder: Secondary | ICD-10-CM | POA: Diagnosis not present

## 2017-09-02 DIAGNOSIS — Z96611 Presence of right artificial shoulder joint: Secondary | ICD-10-CM | POA: Diagnosis not present

## 2017-09-02 DIAGNOSIS — Z79899 Other long term (current) drug therapy: Secondary | ICD-10-CM | POA: Diagnosis not present

## 2017-09-02 DIAGNOSIS — Z885 Allergy status to narcotic agent status: Secondary | ICD-10-CM | POA: Insufficient documentation

## 2017-09-02 DIAGNOSIS — G8929 Other chronic pain: Secondary | ICD-10-CM | POA: Diagnosis not present

## 2017-09-02 DIAGNOSIS — M25511 Pain in right shoulder: Secondary | ICD-10-CM

## 2017-09-02 DIAGNOSIS — Z888 Allergy status to other drugs, medicaments and biological substances status: Secondary | ICD-10-CM | POA: Insufficient documentation

## 2017-09-02 DIAGNOSIS — F419 Anxiety disorder, unspecified: Secondary | ICD-10-CM | POA: Diagnosis not present

## 2017-09-02 DIAGNOSIS — Z79890 Hormone replacement therapy (postmenopausal): Secondary | ICD-10-CM | POA: Diagnosis not present

## 2017-09-02 DIAGNOSIS — Z886 Allergy status to analgesic agent status: Secondary | ICD-10-CM | POA: Insufficient documentation

## 2017-09-02 DIAGNOSIS — Z79891 Long term (current) use of opiate analgesic: Secondary | ICD-10-CM | POA: Insufficient documentation

## 2017-09-02 DIAGNOSIS — Z881 Allergy status to other antibiotic agents status: Secondary | ICD-10-CM | POA: Diagnosis not present

## 2017-09-02 MED ORDER — ROPIVACAINE HCL 2 MG/ML IJ SOLN
9.0000 mL | Freq: Once | INTRAMUSCULAR | Status: AC
  Administered 2017-09-02: 10 mL via PERINEURAL
  Filled 2017-09-02: qty 10

## 2017-09-02 MED ORDER — FENTANYL CITRATE (PF) 100 MCG/2ML IJ SOLN
25.0000 ug | INTRAMUSCULAR | Status: DC | PRN
  Administered 2017-09-02: 50 ug via INTRAVENOUS
  Filled 2017-09-02: qty 2

## 2017-09-02 MED ORDER — METHYLPREDNISOLONE ACETATE 80 MG/ML IJ SUSP
80.0000 mg | Freq: Once | INTRAMUSCULAR | Status: AC
  Administered 2017-09-02: 80 mg via INTRAMUSCULAR
  Filled 2017-09-02: qty 1

## 2017-09-02 MED ORDER — LIDOCAINE HCL 2 % IJ SOLN
10.0000 mL | Freq: Once | INTRAMUSCULAR | Status: AC
  Administered 2017-09-02: 400 mg
  Filled 2017-09-02: qty 20

## 2017-09-02 MED ORDER — MIDAZOLAM HCL 5 MG/5ML IJ SOLN
1.0000 mg | INTRAMUSCULAR | Status: DC | PRN
  Administered 2017-09-02: 2 mg via INTRAVENOUS
  Filled 2017-09-02: qty 5

## 2017-09-02 MED ORDER — LACTATED RINGERS IV SOLN
1000.0000 mL | Freq: Once | INTRAVENOUS | Status: AC
  Administered 2017-09-02: 1000 mL via INTRAVENOUS

## 2017-09-02 NOTE — Progress Notes (Signed)
Safety precautions to be maintained throughout the outpatient stay will include: orient to surroundings, keep bed in low position, maintain call bell within reach at all times, provide assistance with transfer out of bed and ambulation.  

## 2017-09-02 NOTE — Patient Instructions (Signed)

## 2017-09-03 ENCOUNTER — Telehealth: Payer: Self-pay

## 2017-09-03 NOTE — Telephone Encounter (Signed)
pOst procedure phone call.  Patient is unavailable.  Did not leave message with person answering phone.

## 2017-09-15 ENCOUNTER — Ambulatory Visit: Payer: Medicaid Other | Admitting: Pain Medicine

## 2017-09-23 NOTE — Progress Notes (Signed)
Patient's Name: Danielle [name]  MRN: [id number]  Referring Provider: Marygrace Drought, MD  DOB: Jan 02, 1961  PCP: Marygrace Drought, MD  DOS: 09/24/2017  Note by: Gaspar Cola, MD  Service setting: Ambulatory outpatient  Specialty: Interventional Pain Management  Location: [facility] (AMB) Pain Management Facility    Patient type: Established   Primary Reason(s) for Visit: Encounter for post-procedure evaluation of chronic illness with mild to moderate exacerbation CC: Back Pain (lower back)  HPI  Danielle [name] is a 57 y.o. year old, female patient, who comes today for a post-procedure evaluation. She has Long term prescription opiate use; Opiate use (60 MME/Day); B12 deficiency; Cervical spinal cord compression [facility]) (April, 2017); Cervical spinal stenosis; Chronic obstructive pulmonary disease ([place]); Clinical depression; Adult hypothyroidism; Adiposity; Mucositis oral; Groin pain; Current tobacco use; Chronic shoulder pain (Location of Primary Source of Pain) (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); History of total shoulder replacement (Bilateral); Chronic low back pain (Location of Tertiary source of pain) (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); Lumbar facet syndrome (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); Lumbar spondylosis; Failed back surgical syndrome; Chronic lower extremity pain (Right); Chronic neck pain (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); Chronic cervical radicular pain (Right); Ulnar neuropathy (Left); Hx of cervical spine surgery; Cervical spondylosis; Encounter for therapeutic drug level monitoring; Encounter for pain management planning; Fibromyalgia; Chronic sacroiliac pain (B) (R>L); Seasonal allergic rhinitis; S/P total replacement of shoulder (Right); Hypertension; Chronic pain syndrome; Complaints of weakness of lower extremity; Morbid obesity with BMI of 40.0-44.9, adult ([place]); Anterolisthesis; Cervical facet syndrome ([place]); Chronic musculoskeletal pain; Muscle spasm of back; Encounter for screening colonoscopy; Rectal bleeding; Mixed simple and mucopurulent chronic  bronchitis ([place]); Nausea; Vasovagal episode; Myofascial pain; Chronic hip pain (Location of Secondary source of pain) (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); 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 Her primarily concern today is the Back Pain (lower back)  Pain Assessment: Location: Lower, Mid Back Radiating: Denies Onset: More than a month ago Duration: Chronic pain Quality: Throbbing, Discomfort, Constant, Dull Severity: 6 /10 (self-reported pain score)  Note: Reported level is compatible with observation.                         When using our objective Pain Scale, levels between 6 and 10/10 are said to belong in an emergency room, as it progressively worsens from a 6/10, described as severely limiting, requiring emergency care not usually available at an outpatient pain management facility. At a 6/10 level, communication becomes difficult and requires great effort. Assistance to reach the emergency department may be required. Facial flushing and profuse sweating along with potentially dangerous increases in heart rate and blood pressure will be evident. Effect on ADL:   Timing: Constant Modifying factors: heating pad or ice  Danielle [name] comes in today for post-procedure evaluation after the treatment done on 09/02/2017.  Further details on both, my assessment(s), as well as the proposed treatment plan, please see below.  Post-Procedure Assessment  09/02/2017 Procedure: Diagnostic bilateral suprascapular nerve block #2 under fluoroscopic guidance and IV sedation Pre-procedure pain score:  4/10 Post-procedure pain score: 0/10 (100% relief) Influential Factors: BMI: 40.60 kg/m Intra-procedural challenges: None observed.         Assessment challenges: None detected.              Reported  side-effects: None.        Post-procedural adverse reactions or complications: None  reported         Sedation: Sedation provided. When no sedatives are used, the analgesic levels obtained are directly associated to the effectiveness of the local anesthetics. However, when sedation is provided, the level of analgesia obtained during the initial 1 hour following the intervention, is believed to be the result of a combination of factors. These factors may include, but are not limited to: 1. The effectiveness of the local anesthetics used. 2. The effects of the analgesic(s) and/or anxiolytic(s) used. 3. The degree of discomfort experienced by the patient at the time of the procedure. 4. The patients ability and reliability in recalling and recording the events. 5. The presence and influence of possible secondary gains and/or psychosocial factors. Reported result: Relief experienced during the 1st hour after the procedure: 100 % (Ultra-Short Term Relief)            Interpretative annotation: Clinically appropriate result. Analgesia during this period is likely to be Local Anesthetic and/or IV Sedative (Analgesic/Anxiolytic) related.          Effects of local anesthetic: The analgesic effects attained during this period are directly associated to the localized infiltration of local anesthetics and therefore cary significant diagnostic value as to the etiological location, or anatomical origin, of the pain. Expected duration of relief is directly dependent on the pharmacodynamics of the local anesthetic used. Long-acting (4-6 hours) anesthetics used.  Reported result: Relief during the next 4 to 6 hour after the procedure: 100 % (Short-Term Relief)            Interpretative annotation: Clinically appropriate result. Analgesia during this period is likely to be Local Anesthetic-related.          Long-term benefit: Defined as the period of time past the expected duration of local anesthetics (1 hour for  short-acting and 4-6 hours for long-acting). With the possible exception of prolonged sympathetic blockade from the local anesthetics, benefits during this period are typically attributed to, or associated with, other factors such as analgesic sensory neuropraxia, antiinflammatory effects, or beneficial biochemical changes provided by agents other than the local anesthetics.  Reported result: Extended relief following procedure: 90 % (Long-Term Relief)            Interpretative annotation: Clinically appropriate result. Good relief. Therapeutic success. Inflammation plays a part in the etiology to the pain. Etiology is likely inflammatory and mechanical.  Current benefits: Defined as reported results that persistent at this point in time.   Analgesia: 90 % Ms. Markie reports improvement of arthralgia. Function: Ms. Mikels reports improvement in function ROM: Ms. Flaharty reports improvement in ROM Interpretative annotation: Ongoing benefit. Therapeutic success. Effective therapeutic approach.          Interpretation: Results would suggest a successful diagnostic intervention.                  Plan:  Set up procedure as a PRN palliative treatment option for this patient. Today the patient is having problems with her lower back.  Physical exam today would reveal positive pain arising from the facet joints, bilaterally on hyperextension and rotation with exact reproduction of the patient's pain with the provocative maneuver. In view of these results, we will go ahead and schedule the patient to return for a palliative/therapeutic bilateral lumbar facet block.  Laboratory Chemistry  Inflammation Markers (CRP: Acute Phase) (ESR: Chronic Phase) Lab Results  Component Value [name]   CRP 0.9 01/08/2016   ESRSEDRATE 37 (H) 01/08/2016  Renal Function Markers Lab Results  Component Value [name]   BUN 8 01/08/2016   CREATININE 0.94 01/08/2016   GFRAA >60 01/08/2016   GFRNONAA >60  01/08/2016                 Hepatic Function Markers Lab Results  Component Value [name]   AST 18 01/08/2016   ALT 14 01/08/2016   ALBUMIN 3.7 01/08/2016   ALKPHOS 79 01/08/2016                 Electrolytes Lab Results  Component Value [name]   NA 137 01/08/2016   K 3.5 01/08/2016   CL 101 01/08/2016   CALCIUM 9.0 01/08/2016   MG 2.1 01/08/2016                        Neuropathy Markers Lab Results  Component Value [name]   VITAMINB12 >7,500 (H) 01/08/2016                 Bone Pathology Markers Lab Results  Component Value [name]   25OHVITD1 32 01/08/2016   25OHVITD2 2.0 01/08/2016   25OHVITD3 30 01/08/2016                         Note: Lab results reviewed.  Recent Diagnostic Imaging Results  DG C-Arm 1-60 Min-No Report Fluoroscopy was utilized by the requesting physician.  No radiographic  interpretation.   Complexity Note: I personally reviewed the fluoroscopic imaging of the procedure.                        Meds   Current Outpatient Medications:  .  albuterol (PROVENTIL HFA;VENTOLIN HFA) 108 (90 Base) MCG/ACT inhaler, Inhale 2 puffs into the lungs every 4 (four) hours as needed for wheezing or shortness of breath. , Disp: , Rfl:  .  Biotin 10 MG CAPS, Take 1 tablet by mouth daily., Disp: , Rfl:  .  citalopram (CELEXA) 40 MG tablet, Take 40 mg by mouth daily., Disp: , Rfl: 3 .  cyanocobalamin (,VITAMIN B-12,) 1000 MCG/ML injection, Inject into the muscle., Disp: , Rfl:  .  diclofenac sodium (VOLTAREN) 1 % GEL, Apply 2 g topically 4 (four) times daily., Disp: 100 g, Rfl: 2 .  docusate sodium (COLACE) 100 MG capsule, Take 100 mg by mouth 2 (two) times daily., Disp: , Rfl:  .  levothyroxine (SYNTHROID, LEVOTHROID) 50 MCG tablet, TAKE 1 TABLET (50 MCG TOTAL) BY MOUTH DAILY AT 0600., Disp: , Rfl: 11 .  magnesium hydroxide (MILK OF MAGNESIA) 400 MG/5ML suspension, Take by mouth., Disp: , Rfl:  .  naloxone (NARCAN) 2 MG/2ML injection, Inject content of syringe into thigh  muscle. Call 911., Disp: 2 Syringe, Rfl: 1 .  nystatin (MYCOSTATIN/NYSTOP) powder, Apply topically., Disp: , Rfl:  .  Oxycodone HCl 10 MG TABS, Take 1 tablet (10 mg total) by mouth every 6 (six) hours as needed., Disp: 120 tablet, Rfl: 0 .  Oxycodone HCl 10 MG TABS, Take 1 tablet (10 mg total) by mouth every 6 (six) hours as needed., Disp: 120 tablet, Rfl: 0 .  SPIRIVA HANDIHALER 18 MCG inhalation capsule, PLACE 1 CAPSULE (18 MCG TOTAL) INTO INHALER AND INHALE ONCE DAILY., Disp: , Rfl: 5 .  cetirizine (ZYRTEC) 10 MG tablet, Take 10 mg by mouth daily. , Disp: , Rfl:  .  cyclobenzaprine (FLEXERIL) 10 MG tablet, Take 1 tablet (10 mg total) by  mouth 3 (three) times daily as needed for muscle spasms., Disp: 90 tablet, Rfl: 2 .  Oxycodone HCl 10 MG TABS, Take 1 tablet (10 mg total) by mouth every 6 (six) hours as needed., Disp: 120 tablet, Rfl: 0 .  tiZANidine (ZANAFLEX) 4 MG tablet, Take 1 tablet (4 mg total) by mouth every 8 (eight) hours as needed for muscle spasms., Disp: 90 tablet, Rfl: 5  ROS  Constitutional: Denies any fever or chills Gastrointestinal: No reported hemesis, hematochezia, vomiting, or acute GI distress Musculoskeletal: Denies any acute onset joint swelling, redness, loss of ROM, or weakness Neurological: No reported episodes of acute onset apraxia, aphasia, dysarthria, agnosia, amnesia, paralysis, loss of coordination, or loss of consciousness  Allergies  Ms. Opie is allergic to aripiprazole; duloxetine; gabapentin; gold; iodinated diagnostic agents; naproxen sodium; nortriptyline hcl; nsaids; pregabalin; ace inhibitors; aspirin; cefpodoxime; fluoxetine; fluticasone-salmeterol; levofloxacin; lithium; meperidine; paroxetine; tape; telithromycin; theophylline; topiramate; trazodone; triamcinolone; venlafaxine; bupropion; ketorolac tromethamine; morphine; and moxifloxacin.  [place]  Drug: Ms. Standre  reports that she does not use drugs. Alcohol:  reports that she does not drink  alcohol. Tobacco:  reports that she has been smoking cigarettes.  She has a 40.00 pack-year smoking history. She has never used smokeless tobacco. Medical:  has a past medical history of Anxiety, Arthritis, Asthma, Bell's palsy, Bursitis, COPD (chronic obstructive pulmonary disease) ([place]), Depression, Fibromyalgia, Heart murmur, Hepatitis C, Hiatal hernia, Hyperlipidemia, Hypertension, IBS (irritable bowel syndrome), Insomnia, and Thyroid disease. Surgical: Ms. Glab  has a past surgical history that includes necksurgery (10/21/2014); Back surgery (2013); Foot surgery (Right); Elbow surgery (Left); Carpal tunnel release (Right); Nose surgery; Partial hysterectomy; Total shoulder replacement (Bilateral); and Colonoscopy with propofol (N/A, 02/05/2017). Family: family history includes Cancer in her mother; Gout in her mother; Heart disease in her father.  Constitutional Exam  General appearance: Well nourished, well developed, and well hydrated. In no apparent acute distress Vitals:   09/24/17 1335  BP: 124/90  Pulse: 83  Temp: 98.1 F (36.7 C)  SpO2: 92%  Weight: 244 lb (110.7 kg)  Height: _0  (1.651 m)   BMI Assessment: Estimated body mass index is 40.6 kg/m as calculated from the following:   Height as of this encounter: _1  (1.651 m).   Weight as of this encounter: 244 lb (110.7 kg).  BMI interpretation table: BMI level Category Range association with higher incidence of chronic pain  <18 kg/m2 Underweight   18.5-24.9 kg/m2 Ideal body weight   25-29.9 kg/m2 Overweight Increased incidence by 20%  30-34.9 kg/m2 Obese (Class I) Increased incidence by 68%  35-39.9 kg/m2 Severe obesity (Class II) Increased incidence by 136%  >40 kg/m2 Extreme obesity (Class III) Increased incidence by 254%   BMI Readings from Last 4 Encounters:  09/30/17 40.44 kg/m  09/24/17 40.60 kg/m  09/02/17 40.60 kg/m  07/24/17 40.27 kg/m   Wt Readings from Last 4 Encounters:  09/30/17 243 lb (110.2  kg)  09/24/17 244 lb (110.7 kg)  09/02/17 244 lb (110.7 kg)  07/24/17 242 lb (109.8 kg)  Psych/Mental status: Alert, oriented x 3 (person, place, & time)       Eyes: PERLA Respiratory: No evidence of acute respiratory distress  Cervical Spine Area Exam  Skin & Axial Inspection: No masses, redness, edema, swelling, or associated skin lesions Alignment: Symmetrical Functional ROM: Unrestricted ROM      Stability: No instability detected Muscle Tone/Strength: Functionally intact. No obvious neuro-muscular anomalies detected. Sensory (Neurological): Unimpaired Palpation: No palpable anomalies  Upper Extremity (UE) Exam    Side: Right upper extremity  Side: Left upper extremity  Skin & Extremity Inspection: Skin color, temperature, and hair growth are WNL. No peripheral edema or cyanosis. No masses, redness, swelling, asymmetry, or associated skin lesions. No contractures.  Skin & Extremity Inspection: Skin color, temperature, and hair growth are WNL. No peripheral edema or cyanosis. No masses, redness, swelling, asymmetry, or associated skin lesions. No contractures.  Functional ROM: Improved after treatment          Functional ROM: Improved after treatment          Muscle Tone/Strength: Functionally intact. No obvious neuro-muscular anomalies detected.  Muscle Tone/Strength: Functionally intact. No obvious neuro-muscular anomalies detected.  Sensory (Neurological): Improved          Sensory (Neurological): Improved          Palpation: No palpable anomalies              Palpation: No palpable anomalies              Specialized Test(s): Deferred         Specialized Test(s): Deferred          Thoracic Spine Area Exam  Skin & Axial Inspection: No masses, redness, or swelling Alignment: Symmetrical Functional ROM: Unrestricted ROM Stability: No instability detected Muscle Tone/Strength: Functionally intact. No obvious neuro-muscular anomalies detected. Sensory (Neurological):  Unimpaired Muscle strength & Tone: No palpable anomalies  Lumbar Spine Area Exam  Skin & Axial Inspection: No masses, redness, or swelling Alignment: Symmetrical Functional ROM: Minimal ROM      Stability: No instability detected Muscle Tone/Strength: Functionally intact. No obvious neuro-muscular anomalies detected. Sensory (Neurological): Movement-associated pain Palpation: Complains of area being tender to palpation       Provocative Tests: Lumbar Hyperextension and rotation test: Positive bilaterally for facet joint pain. Lumbar Lateral bending test: evaluation deferred today       Patrick's Maneuver: evaluation deferred today                    Gait & Posture Assessment  Ambulation: Unassisted Gait: Modified gait pattern (slower gait speed, wider stride width, and longer stance duration) associated with morbid obesity Posture: Antalgic   Lower Extremity Exam    Side: Right lower extremity  Side: Left lower extremity  Skin & Extremity Inspection: Skin color, temperature, and hair growth are WNL. No peripheral edema or cyanosis. No masses, redness, swelling, asymmetry, or associated skin lesions. No contractures.  Skin & Extremity Inspection: Skin color, temperature, and hair growth are WNL. No peripheral edema or cyanosis. No masses, redness, swelling, asymmetry, or associated skin lesions. No contractures.  Functional ROM: Unrestricted ROM          Functional ROM: Unrestricted ROM          Muscle Tone/Strength: Functionally intact. No obvious neuro-muscular anomalies detected.  Muscle Tone/Strength: Functionally intact. No obvious neuro-muscular anomalies detected.  Sensory (Neurological): Unimpaired  Sensory (Neurological): Unimpaired  Palpation: No palpable anomalies  Palpation: No palpable anomalies   Assessment  Primary Diagnosis & Pertinent Problem List: The primary encounter diagnosis was Chronic low back pain (Location of Tertiary source of pain) (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).  Diagnoses of Lumbar facet syndrome (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), Chronic shoulder pain (Location of Primary Source of Pain) (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), Spondylosis without myelopathy or radiculopathy, lumbosacral region, Chronic musculoskeletal pain, and Muscle spasm of back were also pertinent to this visit.  Status Diagnosis  Reoccurring Improved Persistent 1. Chronic low back pain (  Location of Tertiary source of pain) (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)   2. 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 Chronic shoulder pain (Location of Primary Source of Pain) (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)   4. Spondylosis without myelopathy or radiculopathy, lumbosacral region   5. Chronic musculoskeletal pain   6. Muscle spasm of back     Problems updated and reviewed during this visit: No problems updated. Plan of Care  Pharmacotherapy (Medications Ordered): Meds ordered this encounter  Medications  . orphenadrine (NORFLEX) injection 60 mg  . tiZANidine (ZANAFLEX) 4 MG tablet    Sig: Take 1 tablet (4 mg total) by mouth every 8 (eight) hours as needed for muscle spasms.    Dispense:  90 tablet    Refill:  5    Do not place this medication, or any other prescription from our practice, on "Automatic Refill". Patient may have prescription filled one day early if pharmacy is closed on scheduled refill [name].   Medications administered today: We administered orphenadrine.   Procedure Orders     LUMBAR FACET(MEDIAL BRANCH NERVE BLOCK) [facility] Lab Orders  No laboratory test(s) ordered today   Imaging Orders  No imaging studies ordered today   Referral Orders  No referral(s) requested today    Interventional management options: Planned, scheduled, and/or pending:   Norflex 60 mg IM today. Therapeutic/palliative bilateral lumbar facet block under fluoroscopic guidance and IV sedation    Considering:   Palliative bilateralsuprascapular nerve block Possible bilateral suprascapular nerve radiofrequencyablation Diagnostic bilateral  intra-articular hip joint injection Possible bilateral hip joint radiofrequencyablation Diagnostic bilateral lumbar facet block Possible bilaterallumbar facet radiofrequencyablation Diagnostic caudal epiduralsteroid injection + diagnostic epidurogram Possible Racz procedure. Diagnostic bilateral cervical facet block Possible bilateral cervical facet radiofrequencyablation Diagnostic right-sided cervical epidural steroid injection Diagnostic bilateral sacroiliac joint block Possible bilateral sacroiliac joint RFA   Palliative PRN treatment(s):   Palliative bilateralsuprascapular nerve block Diagnostic bilateral intra-articular hip joint injection Diagnostic bilateral lumbar facet block Diagnostic caudal epiduralsteroid injection + diagnostic epidurogram Diagnostic bilateral cervical facet block Diagnostic right-sided cervical epidural steroid injection Diagnostic bilateral sacroiliac joint block   Provider-requested follow-up: Return for Procedure (w/ sedation): (B) L-FCT BLK.  Future Appointments  [name] Time Provider [place]  10/22/2017  2:30 PM Vevelyn Francois, NP [facility] None   Primary Care Physician: Marygrace Drought, MD Location: [facility] Note by: Gaspar Cola, MD [name]: 09/24/2017; Time: 2:07 PM

## 2017-09-24 ENCOUNTER — Encounter: Payer: Self-pay | Admitting: Pain Medicine

## 2017-09-24 ENCOUNTER — Other Ambulatory Visit: Payer: Self-pay

## 2017-09-24 ENCOUNTER — Ambulatory Visit: Payer: Medicaid Other | Attending: Pain Medicine | Admitting: Pain Medicine

## 2017-09-24 VITALS — BP 124/90 | HR 83 | Temp 98.1°F | Ht 65.0 in | Wt 244.0 lb

## 2017-09-24 DIAGNOSIS — Z79899 Other long term (current) drug therapy: Secondary | ICD-10-CM | POA: Insufficient documentation

## 2017-09-24 DIAGNOSIS — Z886 Allergy status to analgesic agent status: Secondary | ICD-10-CM | POA: Insufficient documentation

## 2017-09-24 DIAGNOSIS — M47817 Spondylosis without myelopathy or radiculopathy, lumbosacral region: Secondary | ICD-10-CM

## 2017-09-24 DIAGNOSIS — M7918 Myalgia, other site: Secondary | ICD-10-CM

## 2017-09-24 DIAGNOSIS — Z888 Allergy status to other drugs, medicaments and biological substances status: Secondary | ICD-10-CM | POA: Insufficient documentation

## 2017-09-24 DIAGNOSIS — M25512 Pain in left shoulder: Secondary | ICD-10-CM | POA: Diagnosis not present

## 2017-09-24 DIAGNOSIS — M47816 Spondylosis without myelopathy or radiculopathy, lumbar region: Secondary | ICD-10-CM | POA: Diagnosis not present

## 2017-09-24 DIAGNOSIS — Z90710 Acquired absence of both cervix and uterus: Secondary | ICD-10-CM | POA: Diagnosis not present

## 2017-09-24 DIAGNOSIS — Z9889 Other specified postprocedural states: Secondary | ICD-10-CM | POA: Diagnosis not present

## 2017-09-24 DIAGNOSIS — E669 Obesity, unspecified: Secondary | ICD-10-CM | POA: Insufficient documentation

## 2017-09-24 DIAGNOSIS — G8929 Other chronic pain: Secondary | ICD-10-CM | POA: Diagnosis not present

## 2017-09-24 DIAGNOSIS — M62830 Muscle spasm of back: Secondary | ICD-10-CM

## 2017-09-24 DIAGNOSIS — Z885 Allergy status to narcotic agent status: Secondary | ICD-10-CM | POA: Diagnosis not present

## 2017-09-24 DIAGNOSIS — M4802 Spinal stenosis, cervical region: Secondary | ICD-10-CM | POA: Diagnosis not present

## 2017-09-24 DIAGNOSIS — Z809 Family history of malignant neoplasm, unspecified: Secondary | ICD-10-CM | POA: Diagnosis not present

## 2017-09-24 DIAGNOSIS — M797 Fibromyalgia: Secondary | ICD-10-CM | POA: Insufficient documentation

## 2017-09-24 DIAGNOSIS — F17210 Nicotine dependence, cigarettes, uncomplicated: Secondary | ICD-10-CM | POA: Diagnosis not present

## 2017-09-24 DIAGNOSIS — M25511 Pain in right shoulder: Secondary | ICD-10-CM | POA: Diagnosis not present

## 2017-09-24 DIAGNOSIS — Z96612 Presence of left artificial shoulder joint: Secondary | ICD-10-CM | POA: Insufficient documentation

## 2017-09-24 DIAGNOSIS — J449 Chronic obstructive pulmonary disease, unspecified: Secondary | ICD-10-CM | POA: Insufficient documentation

## 2017-09-24 DIAGNOSIS — Z96611 Presence of right artificial shoulder joint: Secondary | ICD-10-CM | POA: Insufficient documentation

## 2017-09-24 DIAGNOSIS — M5441 Lumbago with sciatica, right side: Secondary | ICD-10-CM | POA: Insufficient documentation

## 2017-09-24 DIAGNOSIS — I10 Essential (primary) hypertension: Secondary | ICD-10-CM | POA: Insufficient documentation

## 2017-09-24 DIAGNOSIS — F329 Major depressive disorder, single episode, unspecified: Secondary | ICD-10-CM | POA: Diagnosis not present

## 2017-09-24 DIAGNOSIS — M19011 Primary osteoarthritis, right shoulder: Secondary | ICD-10-CM | POA: Diagnosis not present

## 2017-09-24 DIAGNOSIS — M19012 Primary osteoarthritis, left shoulder: Secondary | ICD-10-CM | POA: Diagnosis not present

## 2017-09-24 DIAGNOSIS — Z79891 Long term (current) use of opiate analgesic: Secondary | ICD-10-CM | POA: Insufficient documentation

## 2017-09-24 DIAGNOSIS — Z8249 Family history of ischemic heart disease and other diseases of the circulatory system: Secondary | ICD-10-CM | POA: Insufficient documentation

## 2017-09-24 MED ORDER — ORPHENADRINE CITRATE 30 MG/ML IJ SOLN
60.0000 mg | Freq: Once | INTRAMUSCULAR | Status: AC
  Administered 2017-09-24: 60 mg via INTRAMUSCULAR
  Filled 2017-09-24: qty 2

## 2017-09-24 MED ORDER — TIZANIDINE HCL 4 MG PO TABS: 4.0000 mg | ORAL_TABLET | Freq: Three times a day (TID) | ORAL | 5 refills | Status: DC | PRN

## 2017-09-24 NOTE — Patient Instructions (Addendum)
____________________________________________________________________________________________  Preparing for Procedure with Sedation  Instructions: . Oral Intake: Do not eat or drink anything for at least 8 hours prior to your procedure. . Transportation: Public transportation is not allowed. Bring an adult driver. The driver must be physically present in our waiting room before any procedure can be started. Marland Kitchen Physical Assistance: Bring an adult physically capable of assisting you, in the event you need help. This adult should keep you company at home for at least 6 hours after the procedure. . Blood Pressure Medicine: Take your blood pressure medicine with a sip of water the morning of the procedure. . Blood thinners:  . Diabetics on insulin: Notify the staff so that you can be scheduled 1st case in the morning. If your diabetes requires high dose insulin, take only  of your normal insulin dose the morning of the procedure and notify the staff that you have done so. . Preventing infections: Shower with an antibacterial soap the morning of your procedure. . Build-up your immune system: Take 1000 mg of Vitamin C with every meal (3 times a day) the day prior to your procedure. Marland Kitchen Antibiotics: Inform the staff if you have a condition or reason that requires you to take antibiotics before dental procedures. . Pregnancy: If you are pregnant, call and cancel the procedure. . Sickness: If you have a cold, fever, or any active infections, call and cancel the procedure. . Arrival: You must be in the facility at least 30 minutes prior to your scheduled procedure. . Children: Do not bring children with you. . Dress appropriately: Bring dark clothing that you would not mind if they get stained. . Valuables: Do not bring any jewelry or valuables.  Procedure appointments are reserved for interventional treatments only. Marland Kitchen No Prescription Refills. . No medication changes will be discussed during procedure  appointments. . No disability issues will be discussed.  Remember:  Regular Business hours are:  Monday to Thursday 8:00 AM to 4:00 PM  Provider's Schedule: Milinda Pointer, MD:  Procedure days: Tuesday and Thursday 7:30 AM to 4:00 PM  Gillis Santa, MD:  Procedure days: Monday and Wednesday 7:30 AM to 4:00 PM ____________________________________________________________________________________________    Facet Joint Block The facet joints connect the bones of the spine (vertebrae). They make it possible for you to bend, twist, and make other movements with your spine. They also keep you from bending too far, twisting too far, and making other excessive movements. A facet joint block is a procedure where a numbing medicine (anesthetic) is injected into a facet joint. Often, a type of anti-inflammatory medicine called a steroid is also injected. A facet joint block may be done to diagnose neck or back pain. If the pain gets better after a facet joint block, it means the pain is probably coming from the facet joint. If the pain does not get better, it means the pain is probably not coming from the facet joint. A facet joint block may also be done to relieve neck or back pain caused by an inflamed facet joint. A facet joint block is only done to relieve pain if the pain does not improve with other methods, such as medicine, exercise programs, and physical therapy. Tell a health care provider about:  Any allergies you have.  All medicines you are taking, including vitamins, herbs, eye drops, creams, and over-the-counter medicines.  Any problems you or family members have had with anesthetic medicines.  Any blood disorders you have.  Any surgeries you have had.  Any medical conditions you have.  Whether you are pregnant or may be pregnant. What are the risks? Generally, this is a safe procedure. However, problems may occur, including:  Bleeding.  Injury to a nerve near the  injection site.  Pain at the injection site.  Weakness or numbness in areas controlled by nerves near the injection site.  Infection.  Temporary fluid retention.  Allergic reactions to medicines or dyes.  Injury to other structures or organs near the injection site.  What happens before the procedure?  Follow instructions from your health care provider about eating or drinking restrictions.  Ask your health care provider about: ? Changing or stopping your regular medicines. This is especially important if you are taking diabetes medicines or blood thinners. ? Taking medicines such as aspirin and ibuprofen. These medicines can thin your blood. Do not take these medicines before your procedure if your health care provider instructs you not to.  Do not take any new dietary supplements or medicines without asking your health care provider first.  Plan to have someone take you home after the procedure. What happens during the procedure?  You may need to remove your clothing and dress in an open-back gown.  The procedure will be done while you are lying on an X-ray table. You will most likely be asked to lie on your stomach, but you may be asked to lie in a different position if an injection will be made in your neck.  Machines will be used to monitor your oxygen levels, heart rate, and blood pressure.  If an injection will be made in your neck, an IV tube will be inserted into one of your veins. Fluids and medicine will flow directly into your body through the IV tube.  The area over the facet joint where the injection will be made will be cleaned with soap. The surrounding skin will be covered with clean drapes.  A numbing medicine (local anesthetic) will be applied to your skin. Your skin may sting or burn for a moment.  A video X-ray machine (fluoroscopy) will be used to locate the joint. In some cases, a CT scan may be used.  A contrast dye may be injected into the facet joint  area to help locate the joint.  When the joint is located, an anesthetic will be injected into the joint through the needle.  Your health care provider will ask you whether you feel pain relief. If you do feel relief, a steroid may be injected to provide pain relief for a longer period of time. If you do not feel relief or feel only partial relief, additional injections of an anesthetic may be made in other facet joints.  The needle will be removed.  Your skin will be cleaned.  A bandage (dressing) will be applied over each injection site. The procedure may vary among health care providers and hospitals. What happens after the procedure?  You will be observed for 15-30 minutes before being allowed to go home. This information is not intended to replace advice given to you by your health care provider. Make sure you discuss any questions you have with your health care provider. Document Released: 11/20/2006 Document Revised: 08/02/2015 Document Reviewed: 03/27/2015 [employer] Interactive Patient Education  [employer].

## 2017-09-30 ENCOUNTER — Ambulatory Visit
Admission: RE | Admit: 2017-09-30 | Discharge: 2017-09-30 | Disposition: A | Discharge status: Home / Self Care | Payer: Medicaid Other | Source: Ambulatory Visit | Attending: Pain Medicine | Admitting: Pain Medicine

## 2017-09-30 ENCOUNTER — Encounter: Payer: Self-pay | Admitting: Pain Medicine

## 2017-09-30 ENCOUNTER — Ambulatory Visit: Payer: Medicaid Other | Admitting: Pain Medicine

## 2017-09-30 VITALS — BP 114/76 | HR 82 | Temp 97.7°F | Resp 16 | Ht 65.0 in | Wt 243.0 lb

## 2017-09-30 DIAGNOSIS — Z888 Allergy status to other drugs, medicaments and biological substances status: Secondary | ICD-10-CM | POA: Diagnosis not present

## 2017-09-30 DIAGNOSIS — Z885 Allergy status to narcotic agent status: Secondary | ICD-10-CM | POA: Insufficient documentation

## 2017-09-30 DIAGNOSIS — M5441 Lumbago with sciatica, right side: Secondary | ICD-10-CM | POA: Diagnosis not present

## 2017-09-30 DIAGNOSIS — Z886 Allergy status to analgesic agent status: Secondary | ICD-10-CM | POA: Insufficient documentation

## 2017-09-30 DIAGNOSIS — M19011 Primary osteoarthritis, right shoulder: Secondary | ICD-10-CM

## 2017-09-30 DIAGNOSIS — M47816 Spondylosis without myelopathy or radiculopathy, lumbar region: Secondary | ICD-10-CM | POA: Diagnosis not present

## 2017-09-30 DIAGNOSIS — M25511 Pain in right shoulder: Secondary | ICD-10-CM

## 2017-09-30 DIAGNOSIS — Z9889 Other specified postprocedural states: Secondary | ICD-10-CM | POA: Insufficient documentation

## 2017-09-30 DIAGNOSIS — G8929 Other chronic pain: Secondary | ICD-10-CM

## 2017-09-30 DIAGNOSIS — M47817 Spondylosis without myelopathy or radiculopathy, lumbosacral region: Secondary | ICD-10-CM | POA: Insufficient documentation

## 2017-09-30 DIAGNOSIS — Z96611 Presence of right artificial shoulder joint: Secondary | ICD-10-CM

## 2017-09-30 DIAGNOSIS — Z90710 Acquired absence of both cervix and uterus: Secondary | ICD-10-CM | POA: Insufficient documentation

## 2017-09-30 DIAGNOSIS — M25512 Pain in left shoulder: Secondary | ICD-10-CM

## 2017-09-30 DIAGNOSIS — M19012 Primary osteoarthritis, left shoulder: Secondary | ICD-10-CM

## 2017-09-30 MED ORDER — TRIAMCINOLONE ACETONIDE 40 MG/ML IJ SUSP
80.0000 mg | Freq: Once | INTRAMUSCULAR | Status: DC
  Administered 2017-09-30: 80 mg
  Filled 2017-09-30: qty 2

## 2017-09-30 MED ORDER — LIDOCAINE HCL 2 % IJ SOLN
20.0000 mL | Freq: Once | INTRAMUSCULAR | Status: DC
  Administered 2017-09-30: 400 mg
  Filled 2017-09-30: qty 20

## 2017-09-30 MED ORDER — FENTANYL CITRATE (PF) 100 MCG/2ML IJ SOLN
INTRAMUSCULAR | Status: AC
  Filled 2017-09-30: qty 2

## 2017-09-30 MED ORDER — MIDAZOLAM HCL 5 MG/5ML IJ SOLN
INTRAMUSCULAR | Status: AC
  Filled 2017-09-30: qty 5

## 2017-09-30 MED ORDER — FENTANYL CITRATE (PF) 100 MCG/2ML IJ SOLN
25.0000 ug | INTRAMUSCULAR | Status: DC | PRN
  Administered 2017-09-30: 100 ug via INTRAVENOUS

## 2017-09-30 MED ORDER — MIDAZOLAM HCL 5 MG/5ML IJ SOLN
1.0000 mg | INTRAMUSCULAR | Status: DC | PRN
  Administered 2017-09-30: 4 mg via INTRAVENOUS

## 2017-09-30 MED ORDER — LACTATED RINGERS IV SOLN
1000.0000 mL | Freq: Once | INTRAVENOUS | Status: DC
  Administered 2017-09-30: 1000 mL via INTRAVENOUS

## 2017-09-30 MED ORDER — ROPIVACAINE HCL 2 MG/ML IJ SOLN
18.0000 mL | Freq: Once | INTRAMUSCULAR | Status: DC
  Administered 2017-09-30: 18 mL via PERINEURAL
  Filled 2017-09-30: qty 20

## 2017-09-30 NOTE — Addendum Note (Signed)
Addended by: Morley Kos on: 09/30/2017 02:52 PM   Modules accepted: Orders

## 2017-09-30 NOTE — Patient Instructions (Addendum)

## 2017-09-30 NOTE — Progress Notes (Signed)
Patient's Name: Danielle [name]  MRN: [id number]  Referring Provider: Marygrace Drought, MD  DOB: 1960/12/10  PCP: Marygrace Drought, MD  DOS: 09/30/2017  Note by: Gaspar Cola, MD  Service setting: Ambulatory outpatient  Specialty: Interventional Pain Management  Patient type: Established  Location: [facility] (AMB) Pain Management Facility  Visit type: Interventional Procedure   Primary Reason for Visit: Interventional Pain Management Treatment. CC: Back Pain (R lower back, radiating down to groin area)  Procedure:       Anesthesia, Analgesia, Anxiolysis:  Type: Lumbar Facet, Medial Branch Block(s)          Primary Purpose: Diagnostic Region: Posterolateral Lumbosacral Spine Level: L2, L3, L4, L5, & S1 Medial Branch Level(s). Injecting these levels blocks the L3-4, L4-5, and L5-S1 lumbar facet joints. Laterality: Bilateral  Type: Moderate (Conscious) Sedation combined with Local Anesthesia Indication(s): Analgesia and Anxiety Route: Intravenous (IV) IV Access: Secured Sedation: Meaningful verbal contact was maintained at all times during the procedure  Local Anesthetic: Lidocaine 1-2%   Indications: 1. Spondylosis without myelopathy or radiculopathy, lumbosacral region   2. 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 Chronic low back pain (Location of Tertiary source of pain) (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)   4. Facet syndrome, lumbar   5. Chronic bilateral low back pain with right-sided sciatica    Pain Score: Pre-procedure: 4 /10 Post-procedure: 2 /10  Pre-op Assessment:  Ms. Horn is a 57 y.o. (year old), female patient, seen today for interventional treatment. She  has a past surgical history that includes necksurgery (10/21/2014); Back surgery (2013); Foot surgery (Right); Elbow surgery (Left); Carpal tunnel release (Right); Nose surgery; Partial hysterectomy; Total shoulder replacement (Bilateral); and Colonoscopy with propofol (N/A, 02/05/2017). [name] has a current medication list  which includes the following prescription(s): albuterol, biotin, citalopram, cyanocobalamin, diclofenac sodium, docusate sodium, levothyroxine, magnesium hydroxide, naloxone, nystatin, oxycodone hcl, spiriva handihaler, tizanidine, cetirizine, cyclobenzaprine, oxycodone hcl, and oxycodone hcl, and the following Facility-Administered Medications: fentanyl, lactated ringers, lidocaine, midazolam, ropivacaine (pf) 2 mg/ml (0.2%), and triamcinolone acetonide. Her primarily concern today is the Back Pain (R lower back, radiating down to groin area)  Initial Vital Signs:  Pulse Rate: 82 Temp: 97.7 F (36.5 C) Resp: 18 BP: 125/75 SpO2: 97 %  BMI: Estimated body mass index is 40.44 kg/m as calculated from the following:   Height as of this encounter: 5\' 5"  (1.651 m).   Weight as of this encounter: 243 lb (110.2 kg).  Risk Assessment: Allergies: Reviewed. She is allergic to aripiprazole; duloxetine; gabapentin; gold; iodinated diagnostic agents; naproxen sodium; nortriptyline hcl; nsaids; pregabalin; ace inhibitors; aspirin; cefpodoxime; fluoxetine; fluticasone-salmeterol; levofloxacin; lithium; meperidine; paroxetine; tape; telithromycin; theophylline; topiramate; trazodone; triamcinolone; venlafaxine; bupropion; ketorolac tromethamine; morphine; and moxifloxacin.  Allergy Precautions: No iodine containing solutions or radiological contrast used. Coagulopathies: Reviewed. None identified.  Blood-thinner therapy: None at this time Active Infection(s): Reviewed. None identified. [name] is afebrile  Site Confirmation: [name] was asked to confirm the procedure and laterality before marking the site Procedure checklist: Completed Consent: Before the procedure and under the influence of no sedative(s), amnesic(s), or anxiolytics, the patient was informed of the treatment options, risks and possible complications. To fulfill our ethical and legal obligations, as recommended by the American Medical  Association's Code of Ethics, I have informed the patient of my clinical impression; the nature and purpose of the treatment or procedure; the risks, benefits, and possible complications of the intervention; the alternatives, including doing nothing; the risk(s) and benefit(s) of the alternative treatment(s)  or procedure(s); and the risk(s) and benefit(s) of doing nothing. The patient was provided information about the general risks and possible complications associated with the procedure. These may include, but are not limited to: failure to achieve desired goals, infection, bleeding, organ or nerve damage, allergic reactions, paralysis, and death. In addition, the patient was informed of those risks and complications associated to Spine-related procedures, such as failure to decrease pain; infection (i.e.: Meningitis, epidural or intraspinal abscess); bleeding (i.e.: epidural hematoma, subarachnoid hemorrhage, or any other type of intraspinal or peri-dural bleeding); organ or nerve damage (i.e.: Any type of peripheral nerve, nerve root, or spinal cord injury) with subsequent damage to sensory, motor, and/or autonomic systems, resulting in permanent pain, numbness, and/or weakness of one or several areas of the body; allergic reactions; (i.e.: anaphylactic reaction); and/or death. Furthermore, the patient was informed of those risks and complications associated with the medications. These include, but are not limited to: allergic reactions (i.e.: anaphylactic or anaphylactoid reaction(s)); adrenal axis suppression; blood sugar elevation that in diabetics may result in ketoacidosis or comma; water retention that in patients with history of congestive heart failure may result in shortness of breath, pulmonary edema, and decompensation with resultant heart failure; weight gain; swelling or edema; medication-induced neural toxicity; particulate matter embolism and blood vessel occlusion with resultant organ, and/or  nervous system infarction; and/or aseptic necrosis of one or more joints. Finally, the patient was informed that Medicine is not an exact science; therefore, there is also the possibility of unforeseen or unpredictable risks and/or possible complications that may result in a catastrophic outcome. The patient indicated having understood very clearly. We have given the patient no guarantees and we have made no promises. Enough time was given to the patient to ask questions, all of which were answered to the patient's satisfaction. Ms. Nitta has indicated that she wanted to continue with the procedure. Attestation: I, the ordering provider, attest that I have discussed with the patient the benefits, risks, side-effects, alternatives, likelihood of achieving goals, and potential problems during recovery for the procedure that I have provided informed consent. Date  Time: 09/30/2017 10:19 AM  Pre-Procedure Preparation:  Monitoring: As per clinic protocol. Respiration, ETCO2, SpO2, BP, heart rate and rhythm monitor placed and checked for adequate function Safety Precautions: Patient was assessed for positional comfort and pressure points before starting the procedure. Time-out: I initiated and conducted the "Time-out" before starting the procedure, as per protocol. The patient was asked to participate by confirming the accuracy of the "Time Out" information. Verification of the correct person, site, and procedure were performed and confirmed by me, the nursing staff, and the patient. "Time-out" conducted as per Joint Commission's Universal Protocol (UP.01.01.01). Time: 1110  Description of Procedure:       Position: Prone Laterality: Bilateral. The procedure was performed in identical fashion on both sides. Levels:  L2, L3, L4, L5, & S1 Medial Branch Level(s) Area Prepped: Posterior Lumbosacral Region Prepping solution: ChloraPrep (2% chlorhexidine gluconate and 70% isopropyl alcohol) Safety  Precautions: Aspiration looking for blood return was conducted prior to all injections. At no point did we inject any substances, as a needle was being advanced. Before injecting, the patient was told to immediately notify me if she was experiencing any new onset of "ringing in the ears, or metallic taste in the mouth". No attempts were made at seeking any paresthesias. Safe injection practices and needle disposal techniques used. Medications properly checked for expiration dates. SDV (single dose vial) medications used. After the  completion of the procedure, all disposable equipment used was discarded in the proper designated medical waste containers. Local Anesthesia: Protocol guidelines were followed. The patient was positioned over the fluoroscopy table. The area was prepped in the usual manner. The time-out was completed. The target area was identified using fluoroscopy. A 12-in long, straight, sterile hemostat was used with fluoroscopic guidance to locate the targets for each level blocked. Once located, the skin was marked with an approved surgical skin marker. Once all sites were marked, the skin (epidermis, dermis, and hypodermis), as well as deeper tissues (fat, connective tissue and muscle) were infiltrated with a small amount of a short-acting local anesthetic, loaded on a 10cc syringe with a 25G, 1.5-in  Needle. An appropriate amount of time was allowed for local anesthetics to take effect before proceeding to the next step. Local Anesthetic: Lidocaine 2.0% The unused portion of the local anesthetic was discarded in the proper designated containers. Technical explanation of process:  L2 Medial Branch Nerve Block (MBB): The target area for the L2 medial branch is at the junction of the postero-lateral aspect of the superior articular process and the superior, posterior, and medial edge of the transverse process of L3. Under fluoroscopic guidance, a Quincke needle was inserted until contact was made  with os over the superior postero-lateral aspect of the pedicular shadow (target area). After negative aspiration for blood, 0.5 mL of the nerve block solution was injected without difficulty or complication. The needle was removed intact. L3 Medial Branch Nerve Block (MBB): The target area for the L3 medial branch is at the junction of the postero-lateral aspect of the superior articular process and the superior, posterior, and medial edge of the transverse process of L4. Under fluoroscopic guidance, a Quincke needle was inserted until contact was made with os over the superior postero-lateral aspect of the pedicular shadow (target area). After negative aspiration for blood, 0.5 mL of the nerve block solution was injected without difficulty or complication. The needle was removed intact. L4 Medial Branch Nerve Block (MBB): The target area for the L4 medial branch is at the junction of the postero-lateral aspect of the superior articular process and the superior, posterior, and medial edge of the transverse process of L5. Under fluoroscopic guidance, a Quincke needle was inserted until contact was made with os over the superior postero-lateral aspect of the pedicular shadow (target area). After negative aspiration for blood, 0.5 mL of the nerve block solution was injected without difficulty or complication. The needle was removed intact. L5 Medial Branch Nerve Block (MBB): The target area for the L5 medial branch is at the junction of the postero-lateral aspect of the superior articular process and the superior, posterior, and medial edge of the sacral ala. Under fluoroscopic guidance, a Quincke needle was inserted until contact was made with os over the superior postero-lateral aspect of the pedicular shadow (target area). After negative aspiration for blood, 0.5 mL of the nerve block solution was injected without difficulty or complication. The needle was removed intact. S1 Medial Branch Nerve Block (MBB): The  target area for the S1 medial branch is at the posterior and inferior 6 o'clock position of the L5-S1 facet joint. Under fluoroscopic guidance, the Quincke needle inserted for the L5 MBB was redirected until contact was made with os over the inferior and postero aspect of the sacrum, at the 6 o' clock position under the L5-S1 facet joint (Target area). After negative aspiration for blood, 0.5 mL of the nerve block solution was  injected without difficulty or complication. The needle was removed intact. Procedural Needles: 22-gauge, 3.5-inch, Quincke needles used for all levels. Nerve block solution: 0.2% PF-Ropivacaine + Triamcinolone (40 mg/mL) diluted to a final concentration of 4 mg of Triamcinolone/mL of Ropivacaine The unused portion of the solution was discarded in the proper designated containers.  Once the entire procedure was completed, the treated area was cleaned, making sure to leave some of the prepping solution back to take advantage of its long term bactericidal properties.   Illustration of the posterior view of the lumbar spine and the posterior neural structures. Laminae of L2 through S1 are labeled. DPRL5, dorsal primary ramus of L5; DPRS1, dorsal primary ramus of S1; DPR3, dorsal primary ramus of L3; FJ, facet (zygapophyseal) joint L3-L4; I, inferior articular process of L4; LB1, lateral branch of dorsal primary ramus of L1; IAB, inferior articular branches from L3 medial branch (supplies L4-L5 facet joint); IBP, intermediate branch plexus; MB3, medial branch of dorsal primary ramus of L3; NR3, third lumbar nerve root; S, superior articular process of L5; SAB, superior articular branches from L4 (supplies L4-5 facet joint also); TP3, transverse process of L3.  Vitals:   09/30/17 1120 09/30/17 1130 09/30/17 1140 09/30/17 1149  BP: 102/73 104/69 104/69 114/76  Pulse:      Resp: 15 16 18 16   Temp:      SpO2: 94% 93% 95% 95%  Weight:      Height:        Start Time: 1110 hrs. End  Time: 1118 hrs.  Imaging Guidance (Spinal):  Type of Imaging Technique: Fluoroscopy Guidance (Spinal) Indication(s): Assistance in needle guidance and placement for procedures requiring needle placement in or near specific anatomical locations not easily accessible without such assistance. Exposure Time: Please see nurses notes. Contrast: None used. Fluoroscopic Guidance: I was personally present during the use of fluoroscopy. "Tunnel Vision Technique" used to obtain the best possible view of the target area. Parallax error corrected before commencing the procedure. "Direction-depth-direction" technique used to introduce the needle under continuous pulsed fluoroscopy. Once target was reached, antero-posterior, oblique, and lateral fluoroscopic projection used confirm needle placement in all planes. Images permanently stored in EMR. Interpretation: No contrast injected. I personally interpreted the imaging intraoperatively. Adequate needle placement confirmed in multiple planes. Permanent images saved into the patient's record.  Antibiotic Prophylaxis:   Anti-infectives (From admission, onward)   None     Indication(s): None identified  Post-operative Assessment:  Post-procedure Vital Signs:  Pulse Rate: 82 Temp: 97.7 F (36.5 C) Resp: 16 BP: 114/76 SpO2: 95 %  EBL: None  Complications: No immediate post-treatment complications observed by team, or reported by patient.  Note: The patient tolerated the entire procedure well. A repeat set of vitals were taken after the procedure and the patient was kept under observation following institutional policy, for this type of procedure. Post-procedural neurological assessment was performed, showing return to baseline, prior to discharge. The patient was provided with post-procedure discharge instructions, including a section on how to identify potential problems. Should any problems arise concerning this procedure, the patient was given  instructions to immediately contact us, at any time, without hesitation. In any case, we plan to contact the patient by telephone for a follow-up status report regarding this interventional procedure.  Comments:  No additional relevant information.  Plan of Care   Possible POC:  Ms. Glennie will be returning to the clinic for a postprocedure evaluation in 2 weeks at which time we will see if she  still having some of the right lower extremity pain that she indicated today she was beginning to experience.  This leg pain was going down and a dermatomal pain, following an L4 distribution into the inner portion of her right foot.  If she is still experiencing this, we will consider doing a right-sided L3-4 interlaminar lumbar epidural steroid injection, under fluoroscopic guidance and IV sedation.  The patient had a lumbar facet radiofrequency done on 04/20/2017 and today's procedure is to determine if this radiofrequency has already worn off.  If it has, depending on how much relief she obtained with the RFA, we may be able to repeated after 6 months from the original procedure.  However, in order for this to work well for her, she does need to bring her BMI below 30.    Imaging Orders     DG C-Arm 1-60 Min-No Report  Procedure Orders     LUMBAR FACET(MEDIAL BRANCH NERVE BLOCK) MBNB  Medications ordered for procedure: Meds ordered this encounter  Medications  . lidocaine (XYLOCAINE) 2 % (with pres) injection 400 mg  . midazolam (VERSED) 5 MG/5ML injection 1-2 mg    Make sure Flumazenil is available in the pyxis when using this medication. If oversedation occurs, administer 0.2 mg IV over 15 sec. If after 45 sec no response, administer 0.2 mg again over 1 min; may repeat at 1 min intervals; not to exceed 4 doses (1 mg)  . fentaNYL (SUBLIMAZE) injection 25-50 mcg    Make sure Narcan is available in the pyxis when using this medication. In the event of respiratory depression (RR< 8/min): Titrate  NARCAN (naloxone) in increments of 0.1 to 0.2 mg IV at 2-3 minute intervals, until desired degree of reversal.  . lactated ringers infusion 1,000 mL  . ropivacaine (PF) 2 mg/mL (0.2%) (NAROPIN) injection 18 mL  . triamcinolone acetonide (KENALOG-40) injection 80 mg   Medications administered: We administered midazolam and fentaNYL.  See the medical record for exact dosing, route, and time of administration.  New Prescriptions   No medications on file   Disposition: Discharge home  Discharge Date & Time: 09/30/2017; 1150 hrs.   Physician-requested Follow-up: Return for post-procedure eval (2 wks), w/ Dr. [name].  Future Appointments  Date Time Provider Aub[place]  10/22/2017  2:30 PM Vevelyn Francois, NP [facility] None   Primary Care Physician: Marygrace Drought, MD Location: [facility] Note by: Gaspar Cola, MD Date: 09/30/2017; Time: 12:13 PM  Disclaimer:  Medicine is not an Chief Strategy Officer. The only guarantee in medicine is that nothing is guaranteed. It is important to note that the decision to proceed with this intervention was based on the information collected from the patient. The Data and conclusions were drawn from the patient's questionnaire, the interview, and the physical examination. Because the information was provided in large part by the patient, it cannot be guaranteed that it has not been purposely or unconsciously manipulated. Every effort has been made to obtain as much relevant data as possible for this evaluation. It is important to note that the conclusions that lead to this procedure are derived in large part from the available data. Always take into account that the treatment will also be dependent on availability of resources and existing treatment guidelines, considered by other Pain Management Practitioners as being common knowledge and practice, at the time of the intervention. For Medico-Legal purposes, it is also  important to point out that variation in procedural techniques and pharmacological choices  are the acceptable norm. The indications, contraindications, technique, and results of the above procedure should only be interpreted and judged by a Board-Certified Interventional Pain Specialist with extensive familiarity and expertise in the same exact procedure and technique.

## 2017-10-01 ENCOUNTER — Telehealth: Payer: Self-pay

## 2017-10-01 NOTE — Telephone Encounter (Signed)
Post procedure phone call.  Left message.  

## 2017-10-06 DIAGNOSIS — R928 Other abnormal and inconclusive findings on diagnostic imaging of breast: Secondary | ICD-10-CM | POA: Insufficient documentation

## 2017-10-22 ENCOUNTER — Other Ambulatory Visit: Payer: Self-pay

## 2017-10-22 ENCOUNTER — Encounter: Payer: Self-pay | Admitting: Nurse Practitioner

## 2017-10-22 ENCOUNTER — Ambulatory Visit: Payer: Medicaid Other | Attending: Nurse Practitioner | Admitting: Nurse Practitioner

## 2017-10-22 VITALS — BP 131/86 | HR 87 | Temp 98.6°F | Resp 18 | Ht 65.0 in | Wt 240.0 lb

## 2017-10-22 DIAGNOSIS — E785 Hyperlipidemia, unspecified: Secondary | ICD-10-CM | POA: Insufficient documentation

## 2017-10-22 DIAGNOSIS — G8929 Other chronic pain: Secondary | ICD-10-CM | POA: Diagnosis not present

## 2017-10-22 DIAGNOSIS — M62830 Muscle spasm of back: Secondary | ICD-10-CM

## 2017-10-22 DIAGNOSIS — M160 Bilateral primary osteoarthritis of hip: Secondary | ICD-10-CM | POA: Insufficient documentation

## 2017-10-22 DIAGNOSIS — M533 Sacrococcygeal disorders, not elsewhere classified: Secondary | ICD-10-CM | POA: Insufficient documentation

## 2017-10-22 DIAGNOSIS — Z96611 Presence of right artificial shoulder joint: Secondary | ICD-10-CM | POA: Insufficient documentation

## 2017-10-22 DIAGNOSIS — F419 Anxiety disorder, unspecified: Secondary | ICD-10-CM | POA: Insufficient documentation

## 2017-10-22 DIAGNOSIS — M4802 Spinal stenosis, cervical region: Secondary | ICD-10-CM | POA: Diagnosis not present

## 2017-10-22 DIAGNOSIS — F329 Major depressive disorder, single episode, unspecified: Secondary | ICD-10-CM | POA: Diagnosis not present

## 2017-10-22 DIAGNOSIS — Z5181 Encounter for therapeutic drug level monitoring: Secondary | ICD-10-CM | POA: Insufficient documentation

## 2017-10-22 DIAGNOSIS — M47897 Other spondylosis, lumbosacral region: Secondary | ICD-10-CM | POA: Diagnosis not present

## 2017-10-22 DIAGNOSIS — M5441 Lumbago with sciatica, right side: Secondary | ICD-10-CM

## 2017-10-22 DIAGNOSIS — M19012 Primary osteoarthritis, left shoulder: Secondary | ICD-10-CM | POA: Insufficient documentation

## 2017-10-22 DIAGNOSIS — J302 Other seasonal allergic rhinitis: Secondary | ICD-10-CM | POA: Insufficient documentation

## 2017-10-22 DIAGNOSIS — M5412 Radiculopathy, cervical region: Secondary | ICD-10-CM | POA: Insufficient documentation

## 2017-10-22 DIAGNOSIS — M47816 Spondylosis without myelopathy or radiculopathy, lumbar region: Secondary | ICD-10-CM

## 2017-10-22 DIAGNOSIS — F17210 Nicotine dependence, cigarettes, uncomplicated: Secondary | ICD-10-CM | POA: Diagnosis not present

## 2017-10-22 DIAGNOSIS — Z79891 Long term (current) use of opiate analgesic: Secondary | ICD-10-CM

## 2017-10-22 DIAGNOSIS — M79604 Pain in right leg: Secondary | ICD-10-CM | POA: Diagnosis not present

## 2017-10-22 DIAGNOSIS — K589 Irritable bowel syndrome without diarrhea: Secondary | ICD-10-CM | POA: Diagnosis not present

## 2017-10-22 DIAGNOSIS — G894 Chronic pain syndrome: Secondary | ICD-10-CM | POA: Diagnosis not present

## 2017-10-22 DIAGNOSIS — Z79890 Hormone replacement therapy (postmenopausal): Secondary | ICD-10-CM | POA: Insufficient documentation

## 2017-10-22 DIAGNOSIS — G9529 Other cord compression: Secondary | ICD-10-CM | POA: Diagnosis not present

## 2017-10-22 DIAGNOSIS — R928 Other abnormal and inconclusive findings on diagnostic imaging of breast: Secondary | ICD-10-CM | POA: Diagnosis not present

## 2017-10-22 DIAGNOSIS — Z96612 Presence of left artificial shoulder joint: Secondary | ICD-10-CM | POA: Diagnosis not present

## 2017-10-22 DIAGNOSIS — J449 Chronic obstructive pulmonary disease, unspecified: Secondary | ICD-10-CM | POA: Insufficient documentation

## 2017-10-22 DIAGNOSIS — M7918 Myalgia, other site: Secondary | ICD-10-CM | POA: Diagnosis not present

## 2017-10-22 DIAGNOSIS — M47896 Other spondylosis, lumbar region: Secondary | ICD-10-CM | POA: Insufficient documentation

## 2017-10-22 DIAGNOSIS — Z79899 Other long term (current) drug therapy: Secondary | ICD-10-CM | POA: Insufficient documentation

## 2017-10-22 DIAGNOSIS — Z886 Allergy status to analgesic agent status: Secondary | ICD-10-CM | POA: Insufficient documentation

## 2017-10-22 DIAGNOSIS — Z888 Allergy status to other drugs, medicaments and biological substances status: Secondary | ICD-10-CM | POA: Insufficient documentation

## 2017-10-22 DIAGNOSIS — M797 Fibromyalgia: Secondary | ICD-10-CM | POA: Diagnosis not present

## 2017-10-22 DIAGNOSIS — Z6841 Body Mass Index (BMI) 40.0 and over, adult: Secondary | ICD-10-CM | POA: Insufficient documentation

## 2017-10-22 DIAGNOSIS — M47892 Other spondylosis, cervical region: Secondary | ICD-10-CM | POA: Diagnosis not present

## 2017-10-22 DIAGNOSIS — B1920 Unspecified viral hepatitis C without hepatic coma: Secondary | ICD-10-CM | POA: Diagnosis not present

## 2017-10-22 DIAGNOSIS — K449 Diaphragmatic hernia without obstruction or gangrene: Secondary | ICD-10-CM | POA: Insufficient documentation

## 2017-10-22 DIAGNOSIS — M545 Low back pain: Secondary | ICD-10-CM | POA: Diagnosis not present

## 2017-10-22 DIAGNOSIS — Z885 Allergy status to narcotic agent status: Secondary | ICD-10-CM | POA: Insufficient documentation

## 2017-10-22 DIAGNOSIS — Z90711 Acquired absence of uterus with remaining cervical stump: Secondary | ICD-10-CM | POA: Insufficient documentation

## 2017-10-22 DIAGNOSIS — I10 Essential (primary) hypertension: Secondary | ICD-10-CM | POA: Insufficient documentation

## 2017-10-22 DIAGNOSIS — M19011 Primary osteoarthritis, right shoulder: Secondary | ICD-10-CM | POA: Diagnosis not present

## 2017-10-22 DIAGNOSIS — E538 Deficiency of other specified B group vitamins: Secondary | ICD-10-CM | POA: Insufficient documentation

## 2017-10-22 MED ORDER — OXYCODONE HCL 10 MG PO TABS: 10.0000 mg | ORAL_TABLET | Freq: Four times a day (QID) | ORAL | 0 refills | Status: DC | PRN

## 2017-10-22 MED ORDER — DICLOFENAC SODIUM 1 % TD GEL: 2.0000 g | Freq: Four times a day (QID) | TRANSDERMAL | 2 refills | Status: DC

## 2017-10-22 NOTE — Patient Instructions (Addendum)
_Three prescriptions given today for oxycodone HCL 10MG . Voltaren prescription was sent to your pharmacy. Procedure instructions given. You have a Radiofrequency Lumbar coming up to scehdule.  1) Nothing to eat or drink for 8 hours. 2) Bring a driver 3) No flu shot 2 weeks before or 2 weeks after.  4) If taking BP meds, do not take before. 5) If diabetic, no not take insulin or pills. ___________________________________________________________________________________________  Medication Rules  Applies to: All patients receiving prescriptions (written or electronic).  Pharmacy of record: Pharmacy where electronic prescriptions will be sent. If written prescriptions are taken to a different pharmacy, please inform the nursing staff. The pharmacy listed in the electronic medical record should be the one where you would like electronic prescriptions to be sent.  Prescription refills: Only during scheduled appointments. Applies to both, written and electronic prescriptions.  NOTE: The following applies primarily to controlled substances (Opioid* Pain Medications).   Patient's responsibilities: 1. Pain Pills: Bring all pain pills to every appointment (except for procedure appointments). 2. Pill Bottles: Bring pills in original pharmacy bottle. Always bring newest bottle. Bring bottle, even if empty. 3. Medication refills: You are responsible for knowing and keeping track of what medications you need refilled. The day before your appointment, write a list of all prescriptions that need to be refilled. Bring that list to your appointment and give it to the admitting nurse. Prescriptions will be written only during appointments. If you forget a medication, it will not be "Called in", "Faxed", or "electronically sent". You will need to get another appointment to get these prescribed. 4. Prescription Accuracy: You are responsible for carefully inspecting your prescriptions before leaving our office. Have  the discharge nurse carefully go over each prescription with you, before taking them home. Make sure that your name is accurately spelled, that your address is correct. Check the name and dose of your medication to make sure it is accurate. Check the number of pills, and the written instructions to make sure they are clear and accurate. Make sure that you are given enough medication to last until your next medication refill appointment. 5. Taking Medication: Take medication as prescribed. Never take more pills than instructed. Never take medication more frequently than prescribed. Taking less pills or less frequently is permitted and encouraged, when it comes to controlled substances (written prescriptions).  6. Inform other Doctors: Always inform, all of your healthcare providers, of all the medications you take. 7. Pain Medication from other Providers: You are not allowed to accept any additional pain medication from any other Doctor or Healthcare provider. There are two exceptions to this rule. (see below) In the event that you require additional pain medication, you are responsible for notifying us, as stated below. 8. Medication Agreement: You are responsible for carefully reading and following our Medication Agreement. This must be signed before receiving any prescriptions from our practice. Safely store a copy of your signed Agreement. Violations to the Agreement will result in no further prescriptions. (Additional copies of our Medication Agreement are available upon request.) 9. Laws, Rules, & Regulations: All patients are expected to follow all Federal and [employer], [employer], Rules, [employer]. Ignorance of the Laws does not constitute a valid excuse. The use of any illegal substances is prohibited. 10. Adopted CDC guidelines & recommendations: Target dosing levels will be at or below 60 MME/day. Use of benzodiazepines** is not recommended.  Exceptions: There are only two exceptions to the  rule of not receiving pain medications from other  Healthcare Providers. 1. Exception #1 (Emergencies): In the event of an emergency (i.e.: accident requiring emergency care), you are allowed to receive additional pain medication. However, you are responsible for: As soon as you are able, call our office [phone], at any time of the day or night, and leave a message stating your name, the date and nature of the emergency, and the name and dose of the medication prescribed. In the event that your call is answered by a member of our staff, make sure to document and save the date, time, and the name of the person that took your information.  2. Exception #2 (Planned Surgery): In the event that you are scheduled by another doctor or dentist to have any type of surgery or procedure, you are allowed (for a period no longer than 30 days), to receive additional pain medication, for the acute post-op pain. However, in this case, you are responsible for picking up a copy of our "Post-op Pain Management for Surgeons" handout, and giving it to your surgeon or dentist. This document is available at our office, and does not require an appointment to obtain it. Simply go to our office during business hours (Monday-Thursday from 8:00 AM to 4:00 PM) (Friday 8:00 AM to 12:00 Noon) or if you have a scheduled appointment with [place], prior to your surgery, and ask for it by name. In addition, you will need to provide [place] with your name, name of your surgeon, type of surgery, and date of procedure or surgery.  *Opioid medications include: morphine, codeine, oxycodone, oxymorphone, hydrocodone, hydromorphone, meperidine, tramadol, tapentadol, buprenorphine, fentanyl, methadone. **Benzodiazepine medications include: diazepam (Valium), alprazolam (Xanax), clonazepam (Klonopine), lorazepam (Ativan), clorazepate (Tranxene), chlordiazepoxide (Librium), estazolam (Prosom), oxazepam (Serax), temazepam (Restoril), triazolam (Halcion) (Last  updated: 09/11/2017) ____________________________________________________________________________________________

## 2017-10-22 NOTE — Progress Notes (Signed)
Patient's Name: Danielle [name]  MRN: [id number]  Referring Provider: Marygrace Drought, MD  DOB: September 09, 1960  PCP: Marygrace Drought, MD  DOS: 10/22/2017  Note by: Vevelyn Francois NP  Service setting: Ambulatory outpatient  Specialty: Interventional Pain Management  Location: [facility] (AMB) Pain Management Facility    Patient type: Established    Primary Reason(s) for Visit: Encounter for prescription drug management & post-procedure evaluation of chronic illness with mild to moderate exacerbation(Level of risk: moderate) CC: Back Pain (lower)  HPI  Danielle [name] is a 57 y.o. year old, female patient, who comes today for a post-procedure evaluation and medication management. She has Long term prescription opiate use; Opiate use (60 MME/Day); B12 deficiency; Cervical spinal cord compression [facility]) (April, 2017); Cervical spinal stenosis; Chronic obstructive pulmonary disease ([place]); Clinical depression; Adult hypothyroidism; Adiposity; Mucositis oral; Groin pain; Current tobacco use; Chronic shoulder pain (Location of Primary Source of Pain) (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); History of total shoulder replacement (Bilateral); Chronic low back pain (Location of Tertiary source of pain) (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); Lumbar facet syndrome (Bilateral) (R>L); Lumbar spondylosis; Failed back surgical syndrome; Chronic lower extremity pain (Right); Chronic neck pain (Bilateral) (R>L); Chronic cervical radicular pain (Right); Ulnar neuropathy (Left); Hx of cervical spine surgery; Cervical spondylosis; Encounter for therapeutic drug level monitoring; Encounter for pain management planning; Fibromyalgia; Chronic sacroiliac pain (B) (R>L); Seasonal allergic rhinitis; S/P total replacement of shoulder (Right); Hypertension; Chronic pain syndrome; Complaints of weakness of lower extremity; Morbid obesity with BMI of 40.0-44.9, adult ([place]); Anterolisthesis; Cervical facet syndrome ([place]); Chronic musculoskeletal pain; Muscle spasm of back; Encounter for  screening colonoscopy; Rectal bleeding; Mixed simple and mucopurulent chronic bronchitis ([place]); Nausea; Vasovagal episode; Myofascial pain; Chronic hip pain (Location of Secondary source of pain) (Bilateral) (R>L); 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 Her primarily concern today is the Back Pain (lower)  Pain Assessment: Location: Lower Back Radiating: right groin Onset: More than a month ago Duration: Chronic pain Quality: Dull, Throbbing Severity: 3 /10 (self-reported pain score)  Note: Reported level is compatible with observation.                          Timing: Intermittent Modifying factors: ice, heat, Voltaren gel, medications  Danielle [name] was last seen on 09/30/2017 for a procedure. During today's appointment we reviewed Danielle [name] post-procedure results, as well as her outpatient medication regimen. She admits that she had relief with her facet NB. She has had Bilateral Lumbar Facet RFA in the past which were effective. She feels like this is what will help her long term. She admits that right side is worse and the pain goes down the side of her leg into her groin.   Further details on both, my assessment(s), as well as the proposed treatment plan, please see below.  Controlled Substance Pharmacotherapy Assessment REMS (Risk Evaluation and Mitigation Strategy)  Analgesic:Oxycodone IR 10 mg every 6 hours (40 mg/dayof oxycodone) MME/day:62m/day    WLandis Martins RN  10/22/2017  2:28 PM  Sign at close encounter Nursing Pain Medication Assessment:  Safety precautions to be maintained throughout the outpatient stay will include: orient to surroundings, keep bed in low position, maintain call bell within reach at all times,  provide assistance with transfer out of bed and ambulation.  Medication Inspection  Compliance: Pill count conducted under aseptic conditions, in front of the patient. Neither the pills nor the bottle was removed from the patient's sight at any time. Once count was completed pills were immediately returned to the patient in their original bottle.  Medication: Oxycodone IR Pill/Patch Count: 11 of 120 pills remain Pill/Patch Appearance: Markings consistent with prescribed medication Bottle Appearance: Standard pharmacy container. Clearly labeled. Filled Date: 02/11 / 2019 Last Medication intake:  Today  This is the fill date on the bottle that was brought in. Patient states she put them in an old bottle and this date is not accurate.   Pharmacokinetics: Liberation and absorption (onset of action): WNL Distribution (time to peak effect): WNL Metabolism and excretion (duration of action): WNL         Pharmacodynamics: Desired effects: Analgesia: Danielle [name] reports >50% benefit. Functional ability: Patient reports that medication allows her to accomplish basic ADLs Clinically meaningful improvement in function (CMIF): Sustained CMIF goals met Perceived effectiveness: Described as relatively effective, allowing for increase in activities of daily living (ADL) Undesirable effects: Side-effects or Adverse reactions: None reported Monitoring: [place] PMP: Online review of the past 57-monthperiod conducted. Compliant with practice rules and regulations Last UDS on record: Summary  Date Value Ref Range Status  04/24/2017 FINAL  Final    Comment:    ==================================================================== TOXASSURE SELECT 13 (MW) ==================================================================== Test                             Result       Flag       Units Drug Present and Declared for Prescription Verification   Oxycodone                      1251         EXPECTED   ng/mg creat    Oxymorphone                    78           EXPECTED   ng/mg creat   Noroxycodone                   2117         EXPECTED   ng/mg creat    Sources of oxycodone include scheduled prescription medications.    Oxymorphone and noroxycodone are expected metabolites of    oxycodone. Oxymorphone is also available as a scheduled    prescription medication. Drug Present not Declared for Prescription Verification   Hydrocodone                    527          UNEXPECTED ng/mg creat   Dihydrocodeine                 59           UNEXPECTED ng/mg creat   Norhydrocodone                 288          UNEXPECTED ng/mg creat    Sources of hydrocodone include scheduled prescription    medications. Dihydrocodeine and norhydrocodone are expected    metabolites of hydrocodone. Dihydrocodeine is also available as a    scheduled prescription medication. ==================================================================== Test  Result    Flag   Units      Ref Range   Creatinine              127              mg/dL      >=20 ==================================================================== Declared Medications:  The flagging and interpretation on this report are based on the  following declared medications.  Unexpected results may arise from  inaccuracies in the declared medications.  **Note: The testing scope of this panel includes these medications:  Oxycodone  **Note: The testing scope of this panel does not include following  reported medications:  Albuterol  Cetirizine  Citalopram (Celexa)  Cyclobenzaprine (Flexeril)  Diclofenac (Voltaren)  Docusate (Colace)  Fluconazole (Diflucan)  Levothyroxine  Loratadine (Claritin)  Magnesium (Milk of Magnesia)  Montelukast (Singulair)  Naloxone (Narcan)  Nystatin  Tiotropium (Spiriva)  Vitamin B (Biotin)  Vitamin B12  Vitamin D3 ==================================================================== For clinical consultation, please call  [phone]. ====================================================================    UDS interpretation: Compliant          Medication Assessment Form: Reviewed. Patient indicates being compliant with therapy Treatment compliance: Compliant Risk Assessment Profile: Aberrant behavior: See prior evaluations. None observed or detected today Comorbid factors increasing risk of overdose: See prior notes. No additional risks detected today Risk of substance use disorder (SUD): Low  ORT Scoring interpretation table:  Score <3 = Low Risk for SUD  Score between 4-7 = Moderate Risk for SUD  Score >8 = High Risk for Opioid Abuse   Risk Mitigation Strategies:  Patient Counseling: Covered Patient-Prescriber Agreement (PPA): Present and active  Notification to other healthcare providers: Done  Pharmacologic Plan: No change in therapy, at this time.             Post-Procedure Assessment  09/30/2017 Procedure: Bilateral lumbar facet nerve block Pre-procedure pain score:  4/10 Post-procedure pain score: 2/10         Influential Factors: BMI: 39.94 kg/m Intra-procedural challenges: None observed.         Assessment challenges: None detected.              Reported side-effects: None.        Post-procedural adverse reactions or complications: None reported         Sedation: Please see nurses note. When no sedatives are used, the analgesic levels obtained are directly associated to the effectiveness of the local anesthetics. However, when sedation is provided, the level of analgesia obtained during the initial 1 hour following the intervention, is believed to be the result of a combination of factors. These factors may include, but are not limited to: 1. The effectiveness of the local anesthetics used. 2. The effects of the analgesic(s) and/or anxiolytic(s) used. 3. The degree of discomfort experienced by the patient at the time of the procedure. 4. The patients ability and reliability in  recalling and recording the events. 5. The presence and influence of possible secondary gains and/or psychosocial factors. Reported result: Relief experienced during the 1st hour after the procedure: 100 % (Ultra-Short Term Relief)            Interpretative annotation: Clinically appropriate result. Analgesia during this period is likely to be Local Anesthetic and/or IV Sedative (Analgesic/Anxiolytic) related.          Effects of local anesthetic: The analgesic effects attained during this period are directly associated to the localized infiltration of local anesthetics and therefore cary significant diagnostic value as to the etiological  location, or anatomical origin, of the pain. Expected duration of relief is directly dependent on the pharmacodynamics of the local anesthetic used. Long-acting (4-6 hours) anesthetics used.  Reported result: Relief during the next 4 to 6 hour after the procedure: 75 % (Short-Term Relief)            Interpretative annotation: Clinically appropriate result. Analgesia during this period is likely to be Local Anesthetic-related.          Long-term benefit: Defined as the period of time past the expected duration of local anesthetics (1 hour for short-acting and 4-6 hours for long-acting). With the possible exception of prolonged sympathetic blockade from the local anesthetics, benefits during this period are typically attributed to, or associated with, other factors such as analgesic sensory neuropraxia, antiinflammatory effects, or beneficial biochemical changes provided by agents other than the local anesthetics.  Reported result: Extended relief following procedure: 60 % (Long-Term Relief)            Interpretative annotation: Clinically appropriate result. Good relief. No permanent benefit expected. Inflammation plays a part in the etiology to the pain.          Current benefits: Defined as reported results that persistent at this point in time.   Analgesia: >50 %             Function: Somewhat improved ROM: Somewhat improved Interpretative annotation: Recurrence of symptoms. No permanent benefit expected. Effective diagnostic intervention.          Interpretation: Results would suggest a successful diagnostic intervention.                  Plan:  Please see "Plan of Care" for details.                Laboratory Chemistry  Inflammation Markers (CRP: Acute Phase) (ESR: Chronic Phase) Lab Results  Component Value Date   CRP 0.9 01/08/2016   ESRSEDRATE 37 (H) 01/08/2016                         Rheumatology Markers No results found for: RF, ANA, Therisa Doy[place], [facility]                      Renal Function Markers Lab Results  Component Value Date   BUN 8 01/08/2016   CREATININE 0.94 01/08/2016   GFRAA >60 01/08/2016   GFRNONAA >60 01/08/2016                              Hepatic Function Markers Lab Results  Component Value Date   AST 18 01/08/2016   ALT 14 01/08/2016   ALBUMIN 3.7 01/08/2016   ALKPHOS 79 01/08/2016                        Electrolytes Lab Results  Component Value Date   NA 137 01/08/2016   K 3.5 01/08/2016   CL 101 01/08/2016   CALCIUM 9.0 01/08/2016   MG 2.1 01/08/2016                        Neuropathy Markers Lab Results  Component Value Date   VITAMINB12 >7,500 (H) 01/08/2016                        Bone Pathology Markers Lab Results  Component  Value Date   25OHVITD1 32 01/08/2016   25OHVITD2 2.0 01/08/2016   25OHVITD3 30 01/08/2016                         Coagulation Parameters No results found for: INR, LABPROT, APTT, PLT, DDIMER                      Cardiovascular Markers No results found for: BNP, CKTOTAL, CKMB, TROPONINI, HGB, HCT                       CA Markers No results found for: CEA, CA125, LABCA2                      Note: Lab results reviewed.  Recent Diagnostic Imaging Results  DG C-Arm 1-60 Min-No Report Fluoroscopy was utilized by the requesting physician.   No radiographic  interpretation.   Complexity Note: Imaging results reviewed. Results shared with Ms. Domingo Cocking, using Layman's terms.                         Meds   Current Outpatient Medications:  .  albuterol (PROVENTIL HFA;VENTOLIN HFA) 108 (90 Base) MCG/ACT inhaler, Inhale 2 puffs into the lungs every 4 (four) hours as needed for wheezing or shortness of breath. , Disp: , Rfl:  .  Biotin 10 MG CAPS, Take 1 tablet by mouth daily., Disp: , Rfl:  .  citalopram (CELEXA) 40 MG tablet, Take 40 mg by mouth daily., Disp: , Rfl: 3 .  cyanocobalamin (,VITAMIN B-12,) 1000 MCG/ML injection, Inject into the muscle., Disp: , Rfl:  .  diclofenac sodium (VOLTAREN) 1 % GEL, Apply 2 g topically 4 (four) times daily., Disp: 100 g, Rfl: 2 .  docusate sodium (COLACE) 100 MG capsule, Take 100 mg by mouth 2 (two) times daily., Disp: , Rfl:  .  levothyroxine (SYNTHROID, LEVOTHROID) 50 MCG tablet, TAKE 1 TABLET (50 MCG TOTAL) BY MOUTH DAILY AT 0600., Disp: , Rfl: 11 .  magnesium hydroxide (MILK OF MAGNESIA) 400 MG/5ML suspension, Take by mouth., Disp: , Rfl:  .  naloxone (NARCAN) 2 MG/2ML injection, Inject content of syringe into thigh muscle. Call 911., Disp: 2 Syringe, Rfl: 1 .  nystatin (MYCOSTATIN/NYSTOP) powder, Apply topically., Disp: , Rfl:  .  [START ON 10/25/2017] Oxycodone HCl 10 MG TABS, Take 1 tablet (10 mg total) by mouth every 6 (six) hours as needed., Disp: 120 tablet, Rfl: 0 .  SPIRIVA HANDIHALER 18 MCG inhalation capsule, PLACE 1 CAPSULE (18 MCG TOTAL) INTO INHALER AND INHALE ONCE DAILY., Disp: , Rfl: 5 .  tiZANidine (ZANAFLEX) 4 MG tablet, Take 1 tablet (4 mg total) by mouth every 8 (eight) hours as needed for muscle spasms., Disp: 90 tablet, Rfl: 5 .  cetirizine (ZYRTEC) 10 MG tablet, Take 10 mg by mouth daily. , Disp: , Rfl:  .  [START ON 12/24/2017] Oxycodone HCl 10 MG TABS, Take 1 tablet (10 mg total) by mouth every 6 (six) hours as needed., Disp: 120 tablet, Rfl: 0 .  [START ON 11/24/2017]  Oxycodone HCl 10 MG TABS, Take 1 tablet (10 mg total) by mouth every 6 (six) hours as needed., Disp: 120 tablet, Rfl: 0  ROS  Constitutional: Denies any fever or chills Gastrointestinal: No reported hemesis, hematochezia, vomiting, or acute GI distress Musculoskeletal: Denies any acute onset joint swelling, redness, loss of ROM, or weakness Neurological:  No reported episodes of acute onset apraxia, aphasia, dysarthria, agnosia, amnesia, paralysis, loss of coordination, or loss of consciousness  Allergies  Danielle [name] is allergic to aripiprazole; duloxetine; gabapentin; gold; iodinated diagnostic agents; naproxen sodium; nortriptyline hcl; nsaids; pregabalin; ace inhibitors; aspirin; cefpodoxime; fluoxetine; fluticasone-salmeterol; levofloxacin; lithium; meperidine; paroxetine; tape; telithromycin; theophylline; topiramate; trazodone; triamcinolone; venlafaxine; bupropion; ketorolac tromethamine; morphine; and moxifloxacin.  [place]  Drug: Danielle [name]  reports that she does not use drugs. Alcohol:  reports that she does not drink alcohol. Tobacco:  reports that she has been smoking cigarettes.  She has a 40.00 pack-year smoking history. She has never used smokeless tobacco. Medical:  has a past medical history of Anxiety, Arthritis, Asthma, Bell's palsy, Bursitis, COPD (chronic obstructive pulmonary disease) ([place]), Depression, Fibromyalgia, Heart murmur, Hepatitis C, Hiatal hernia, Hyperlipidemia, Hypertension, IBS (irritable bowel syndrome), Insomnia, and Thyroid disease. Surgical: Danielle [name]  has a past surgical history that includes necksurgery (10/21/2014); Back surgery (2013); Foot surgery (Right); Elbow surgery (Left); Carpal tunnel release (Right); Nose surgery; Partial hysterectomy; Total shoulder replacement (Bilateral); and Colonoscopy with propofol (N/A, 02/05/2017). Family: family history includes Cancer in her mother; Gout in her mother; Heart disease in her father.  Constitutional Exam   General appearance: Well nourished, well developed, and well hydrated. In no apparent acute distress Vitals:   10/22/17 1422  BP: 131/86  Pulse: 87  Resp: 18  Temp: 98.6 F (37 C)  TempSrc: Oral  SpO2: 98%  Weight: 240 lb (108.9 kg)  Height: 5' 5" (1.651 m)  Psych/Mental status: Alert, oriented x 3 (person, place, & time)       Eyes: PERLA Respiratory: No evidence of acute respiratory distress   Lumbar Spine Area Exam  Skin & Axial Inspection: No masses, redness, or swelling Alignment: Symmetrical Functional ROM: Unrestricted ROM      Stability: No instability detected Muscle Tone/Strength: Functionally intact. No obvious neuro-muscular anomalies detected. Sensory (Neurological): Unimpaired Palpation: Complains of area being tender to palpation       Provocative Tests: Lumbar Hyperextension and rotation test: Positive bilaterally for facet joint pain. Lumbar Lateral bending test: evaluation deferred today       Patrick's Maneuver: evaluation deferred today                    Gait & Posture Assessment  Ambulation: Unassisted Gait: Relatively normal for age and body habitus Posture: WNL   Lower Extremity Exam    Side: Right lower extremity  Side: Left lower extremity  Skin & Extremity Inspection: Skin color, temperature, and hair growth are WNL. No peripheral edema or cyanosis. No masses, redness, swelling, asymmetry, or associated skin lesions. No contractures.  Skin & Extremity Inspection: Skin color, temperature, and hair growth are WNL. No peripheral edema or cyanosis. No masses, redness, swelling, asymmetry, or associated skin lesions. No contractures.  Functional ROM: Unrestricted ROM          Functional ROM: Unrestricted ROM          Muscle Tone/Strength: Functionally intact. No obvious neuro-muscular anomalies detected.  Muscle Tone/Strength: Functionally intact. No obvious neuro-muscular anomalies detected.  Sensory (Neurological): Unimpaired  Sensory (Neurological):  Unimpaired  Palpation: No palpable anomalies  Palpation: No palpable anomalies   Assessment  Primary Diagnosis & Pertinent Problem List: The primary encounter diagnosis was Lumbar spondylosis. Diagnoses of Lumbar facet syndrome (Bilateral) (R>L), Chronic sacroiliac pain (B) (R>L), Long term prescription opiate use, Chronic pain syndrome, Chronic low back pain (Location of Tertiary source of pain) (Bilateral) (R>L),  Muscle spasm of back, and Chronic musculoskeletal pain were also pertinent to this visit.  Status Diagnosis  Controlled Controlled Controlled 1. Lumbar spondylosis   2. 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 Chronic sacroiliac pain (B) (R>L)   4. Long term prescription opiate use   5. Chronic pain syndrome   6. Chronic low back pain (Location of Tertiary source of pain) (Bilateral) (R>L)   7. Muscle spasm of back   8. Chronic musculoskeletal pain     Problems updated and reviewed during this visit: Problem  Abnormal Mammogram of Left Breast   Plan of Care  Pharmacotherapy (Medications Ordered): Meds ordered this encounter  Medications  . Oxycodone HCl 10 MG TABS    Sig: Take 1 tablet (10 mg total) by mouth every 6 (six) hours as needed.    Dispense:  120 tablet    Refill:  0    Do not add this medication to the electronic "Automatic Refill" notification system. Patient may have prescription filled one day early if pharmacy is closed on scheduled refill date. Do not fill until: 12/24/2017 To last until:01/23/2018    Order Specific Question:   Supervising Provider    Answer:   Milinda Pointer [phone]  . Oxycodone HCl 10 MG TABS    Sig: Take 1 tablet (10 mg total) by mouth every 6 (six) hours as needed.    Dispense:  120 tablet    Refill:  0    Do not add this medication to the electronic "Automatic Refill" notification system. Patient may have prescription filled one day early if pharmacy is closed on scheduled refill date. Do not fill until: 11/24/2017 To  last until:12/24/2017    Order Specific Question:   Supervising Provider    Answer:   Milinda Pointer [phone]  . Oxycodone HCl 10 MG TABS    Sig: Take 1 tablet (10 mg total) by mouth every 6 (six) hours as needed.    Dispense:  120 tablet    Refill:  0    Do not add this medication to the electronic "Automatic Refill" notification system. Patient may have prescription filled one day early if pharmacy is closed on scheduled refill date. Do not fill until:10/25/2017 To last until: 11/24/2017    Order Specific Question:   Supervising Provider    Answer:   Milinda Pointer [phone]  . diclofenac sodium (VOLTAREN) 1 % GEL    Sig: Apply 2 g topically 4 (four) times daily.    Dispense:  100 g    Refill:  2    Order Specific Question:   Supervising Provider    AnswerMilinda Pointer [phone]   New Prescriptions   No medications on file   Medications administered today: Raina A. Domingo Cocking "ANGIE" had no medications administered during this visit. Lab-work, procedure(s), and/or referral(s): Orders Placed This Encounter  Procedures  . Radiofrequency,Lumbar  . ToxASSURE Select 13 (MW), Urine   Imaging and/or referral(s): None   Interventional therapies: Planned, scheduled, and/or pending:  Rightlumbar facet radiofrequencyablation   Considering:  Palliative bilateralsuprascapular nerve block Possible bilateral suprascapular nerve radiofrequencyablation Diagnostic bilateral intra-articular hip joint injection Possible bilateral hip joint radiofrequencyablation Diagnostic bilateral lumbar facet block Possible bilaterallumbar facet radiofrequencyablation Diagnostic caudal epiduralsteroid injection + diagnostic epidurogram Possible Racz procedure. Diagnostic bilateral cervical facet block Possible bilateral cervical facet radiofrequencyablation Diagnostic right-sided cervical epidural steroid injection Diagnostic bilateral sacroiliac joint block Possible  bilateral sacroiliac joint RFA   Palliative PRN treatment(s):  Palliative bilateralsuprascapular nerve block  Diagnostic bilateral intra-articular hip joint injection Diagnostic bilateral lumbar facet block Diagnostic caudal epiduralsteroid injection + diagnostic epidurogram Diagnostic bilateral cervical facet block Diagnostic right-sided cervical epidural steroid injection Diagnostic bilateral sacroiliac joint block    Provider-requested follow-up: Return in about 3 months (around 01/21/2018) for MedMgmt with Me Dionisio David), in addition, Procedure(w/Sedation), w/ Dr. [name].  Future Appointments  Date Time Provider [place]  12/16/2017 10:30 AM Milinda Pointer, MD [facility] None  01/21/2018  2:30 PM Vevelyn Francois, NP [facility] None   Primary Care Physician: Marygrace Drought, MD Location: [facility] Outpatient Pain Management Facility Note by: Vevelyn Francois NP Date: 10/22/2017; Time: 3:42 PM  Pain Score Disclaimer: We use the NRS-11 scale. This is a self-reported, subjective measurement of pain severity with only modest accuracy. It is used primarily to identify changes within a particular patient. It must be understood that outpatient pain scales are significantly less accurate that those used for research, where they can be applied under ideal controlled circumstances with minimal exposure to variables. In reality, the score is likely to be a combination of pain intensity and pain affect, where pain affect describes the degree of emotional arousal or changes in action readiness caused by the sensory experience of pain. Factors such as social and work situation, setting, emotional state, anxiety levels, expectation, and prior pain experience may influence pain perception and show large inter-individual differences that may also be affected by time variables.  Patient instructions provided during this appointment: Patient Instructions  _Three prescriptions given today for  oxycodone HCL 10MG. Voltaren prescription was sent to your pharmacy. Procedure instructions given. You have a Radiofrequency Lumbar coming up to scehdule.  1) Nothing to eat or drink for 8 hours. 2) Bring a driver 3) No flu shot 2 weeks before or 2 weeks after.  4) If taking BP meds, do not take before. 5) If diabetic, no not take insulin or pills. ___________________________________________________________________________________________  Medication Rules  Applies to: All patients receiving prescriptions (written or electronic).  Pharmacy of record: Pharmacy where electronic prescriptions will be sent. If written prescriptions are taken to a different pharmacy, please inform the nursing staff. The pharmacy listed in the electronic medical record should be the one where you would like electronic prescriptions to be sent.  Prescription refills: Only during scheduled appointments. Applies to both, written and electronic prescriptions.  NOTE: The following applies primarily to controlled substances (Opioid* Pain Medications).   Patient's responsibilities: 1. Pain Pills: Bring all pain pills to every appointment (except for procedure appointments). 2. Pill Bottles: Bring pills in original pharmacy bottle. Always bring newest bottle. Bring bottle, even if empty. 3. Medication refills: You are responsible for knowing and keeping track of what medications you need refilled. The day before your appointment, write a list of all prescriptions that need to be refilled. Bring that list to your appointment and give it to the admitting nurse. Prescriptions will be written only during appointments. If you forget a medication, it will not be "Called in", "Faxed", or "electronically sent". You will need to get another appointment to get these prescribed. 4. Prescription Accuracy: You are responsible for carefully inspecting your prescriptions before leaving our office. Have the discharge nurse carefully go over  each prescription with you, before taking them home. Make sure that your name is accurately spelled, that your address is correct. Check the name and dose of your medication to make sure it is accurate. Check the number of pills, and the written instructions to make sure  they are clear and accurate. Make sure that you are given enough medication to last until your next medication refill appointment. 5. Taking Medication: Take medication as prescribed. Never take more pills than instructed. Never take medication more frequently than prescribed. Taking less pills or less frequently is permitted and encouraged, when it comes to controlled substances (written prescriptions).  6. Inform other Doctors: Always inform, all of your healthcare providers, of all the medications you take. 7. Pain Medication from other Providers: You are not allowed to accept any additional pain medication from any other Doctor or Healthcare provider. There are two exceptions to this rule. (see below) In the event that you require additional pain medication, you are responsible for notifying us, as stated below. 8. Medication Agreement: You are responsible for carefully reading and following our Medication Agreement. This must be signed before receiving any prescriptions from our practice. Safely store a copy of your signed Agreement. Violations to the Agreement will result in no further prescriptions. (Additional copies of our Medication Agreement are available upon request.) 9. Laws, Rules, & Regulations: All patients are expected to follow all Federal and [employer], [employer], Rules, [employer]. Ignorance of the Laws does not constitute a valid excuse. The use of any illegal substances is prohibited. 10. Adopted CDC guidelines & recommendations: Target dosing levels will be at or below 60 MME/day. Use of benzodiazepines** is not recommended.  Exceptions: There are only two exceptions to the rule of not receiving pain medications  from other Healthcare Providers. 1. Exception #1 (Emergencies): In the event of an emergency (i.e.: accident requiring emergency care), you are allowed to receive additional pain medication. However, you are responsible for: As soon as you are able, call our office [phone], at any time of the day or night, and leave a message stating your name, the date and nature of the emergency, and the name and dose of the medication prescribed. In the event that your call is answered by a member of our staff, make sure to document and save the date, time, and the name of the person that took your information.  2. Exception #2 (Planned Surgery): In the event that you are scheduled by another doctor or dentist to have any type of surgery or procedure, you are allowed (for a period no longer than 30 days), to receive additional pain medication, for the acute post-op pain. However, in this case, you are responsible for picking up a copy of our "Post-op Pain Management for Surgeons" handout, and giving it to your surgeon or dentist. This document is available at our office, and does not require an appointment to obtain it. Simply go to our office during business hours (Monday-Thursday from 8:00 AM to 4:00 PM) (Friday 8:00 AM to 12:00 Noon) or if you have a scheduled appointment with [place], prior to your surgery, and ask for it by name. In addition, you will need to provide [place] with your name, name of your surgeon, type of surgery, and date of procedure or surgery.  *Opioid medications include: morphine, codeine, oxycodone, oxymorphone, hydrocodone, hydromorphone, meperidine, tramadol, tapentadol, buprenorphine, fentanyl, methadone. **Benzodiazepine medications include: diazepam (Valium), alprazolam (Xanax), clonazepam (Klonopine), lorazepam (Ativan), clorazepate (Tranxene), chlordiazepoxide (Librium), estazolam (Prosom), oxazepam (Serax), temazepam (Restoril), triazolam (Halcion) (Last updated:  09/11/2017) ____________________________________________________________________________________________

## 2017-10-22 NOTE — Progress Notes (Signed)
Nursing Pain Medication Assessment:  Safety precautions to be maintained throughout the outpatient stay will include: orient to surroundings, keep bed in low position, maintain call bell within reach at all times, provide assistance with transfer out of bed and ambulation.  Medication Inspection Compliance: Pill count conducted under aseptic conditions, in front of the patient. Neither the pills nor the bottle was removed from the patient's sight at any time. Once count was completed pills were immediately returned to the patient in their original bottle.  Medication: Oxycodone IR Pill/Patch Count: 11 of 120 pills remain Pill/Patch Appearance: Markings consistent with prescribed medication Bottle Appearance: Standard pharmacy container. Clearly labeled. Filled Date: 02/11 / 2019 Last Medication intake:  Today  This is the fill date on the bottle that was brought in. Patient states she put them in an old bottle and this date is not accurate.

## 2017-10-22 NOTE — Progress Notes (Signed)
duplicate

## 2017-10-28 LAB — TOXASSURE SELECT 13 (MW), URINE

## 2017-12-16 ENCOUNTER — Other Ambulatory Visit: Payer: Self-pay

## 2017-12-16 ENCOUNTER — Ambulatory Visit: Payer: Medicaid Other | Admitting: Pain Medicine

## 2017-12-16 ENCOUNTER — Encounter: Payer: Self-pay | Admitting: Pain Medicine

## 2017-12-16 ENCOUNTER — Ambulatory Visit
Admission: RE | Admit: 2017-12-16 | Discharge: 2017-12-16 | Disposition: A | Discharge status: Home / Self Care | Payer: Medicaid Other | Source: Ambulatory Visit | Attending: Pain Medicine | Admitting: Pain Medicine

## 2017-12-16 VITALS — BP 118/79 | HR 79 | Temp 97.6°F | Resp 12 | Ht 65.0 in | Wt 243.0 lb

## 2017-12-16 DIAGNOSIS — Z90710 Acquired absence of both cervix and uterus: Secondary | ICD-10-CM | POA: Diagnosis not present

## 2017-12-16 DIAGNOSIS — Z886 Allergy status to analgesic agent status: Secondary | ICD-10-CM | POA: Insufficient documentation

## 2017-12-16 DIAGNOSIS — M5441 Lumbago with sciatica, right side: Secondary | ICD-10-CM

## 2017-12-16 DIAGNOSIS — G8929 Other chronic pain: Secondary | ICD-10-CM | POA: Diagnosis not present

## 2017-12-16 DIAGNOSIS — M47817 Spondylosis without myelopathy or radiculopathy, lumbosacral region: Secondary | ICD-10-CM

## 2017-12-16 DIAGNOSIS — M545 Low back pain: Secondary | ICD-10-CM | POA: Diagnosis present

## 2017-12-16 DIAGNOSIS — Z9889 Other specified postprocedural states: Secondary | ICD-10-CM | POA: Diagnosis not present

## 2017-12-16 DIAGNOSIS — M47816 Spondylosis without myelopathy or radiculopathy, lumbar region: Secondary | ICD-10-CM | POA: Diagnosis not present

## 2017-12-16 DIAGNOSIS — Z888 Allergy status to other drugs, medicaments and biological substances status: Secondary | ICD-10-CM | POA: Insufficient documentation

## 2017-12-16 DIAGNOSIS — G8918 Other acute postprocedural pain: Secondary | ICD-10-CM

## 2017-12-16 DIAGNOSIS — Z885 Allergy status to narcotic agent status: Secondary | ICD-10-CM | POA: Insufficient documentation

## 2017-12-16 MED ORDER — HYDROCODONE-ACETAMINOPHEN 5-325 MG PO TABS: 1.0000 | ORAL_TABLET | Freq: Four times a day (QID) | ORAL | 0 refills | Status: DC | PRN

## 2017-12-16 MED ORDER — LIDOCAINE HCL 2 % IJ SOLN
20.0000 mL | Freq: Once | INTRAMUSCULAR | Status: AC
  Administered 2017-12-16: 400 mg
  Filled 2017-12-16: qty 20

## 2017-12-16 MED ORDER — LACTATED RINGERS IV SOLN
1000.0000 mL | Freq: Once | INTRAVENOUS | Status: AC
  Administered 2017-12-16: 1000 mL via INTRAVENOUS

## 2017-12-16 MED ORDER — ROPIVACAINE HCL 2 MG/ML IJ SOLN
9.0000 mL | Freq: Once | INTRAMUSCULAR | Status: AC
  Administered 2017-12-16: 10 mL via PERINEURAL
  Filled 2017-12-16: qty 10

## 2017-12-16 MED ORDER — MIDAZOLAM HCL 5 MG/5ML IJ SOLN
1.0000 mg | INTRAMUSCULAR | Status: AC | PRN
  Administered 2017-12-16: 4 mg via INTRAVENOUS
  Filled 2017-12-16: qty 5

## 2017-12-16 MED ORDER — FENTANYL CITRATE (PF) 100 MCG/2ML IJ SOLN
25.0000 ug | INTRAMUSCULAR | Status: AC | PRN
  Administered 2017-12-16: 100 ug via INTRAVENOUS

## 2017-12-16 MED ORDER — TRIAMCINOLONE ACETONIDE 40 MG/ML IJ SUSP
40.0000 mg | Freq: Once | INTRAMUSCULAR | Status: AC
  Administered 2017-12-16: 40 mg
  Filled 2017-12-16: qty 1

## 2017-12-16 NOTE — Patient Instructions (Addendum)

## 2017-12-16 NOTE — Progress Notes (Signed)
Patient's Name: Danielle [name]  MRN: [id number]  Referring Provider: Marygrace Drought, MD  DOB: 11/14/1960  PCP: Marygrace Drought, MD  DOS: 12/16/2017  Note by: Gaspar Cola, MD  Service setting: Ambulatory outpatient  Specialty: Interventional Pain Management  Patient type: Established  Location: [facility] (AMB) Pain Management Facility  Visit type: Interventional Procedure   Primary Reason for Visit: Interventional Pain Management Treatment. CC: Back Pain  Procedure:       Anesthesia, Analgesia, Anxiolysis:  Type: Thermal Lumbar Facet, Medial Branch Radiofrequency Neurotomy Level: L2, L3, L4, L5, & S1 Medial Branch Level(s). These levels will denervate the L3-4, L4-5, and the L5-S1 lumbar facet joints. Primary Purpose: Therapeutic Region: Posterolateral Lumbosacral Spine Laterality: Right  Type: Moderate (Conscious) Sedation combined with Local Anesthesia Indication(s): Analgesia and Anxiety Route: Intravenous (IV) IV Access: Secured Sedation: Meaningful verbal contact was maintained at all times during the procedure  Local Anesthetic: Lidocaine 1-2%   Indications: 1. Spondylosis without myelopathy or radiculopathy, lumbosacral region   2. 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 Chronic low back pain [facility] source of pain) (Bilateral) (R>L)    Danielle [name] has been dealing with the above chronic pain for longer than three months and has either failed to respond, was unable to tolerate, or simply did not get enough benefit from other more conservative therapies including, but not limited to: 1. Over-the-counter medications 2. Anti-inflammatory medications 3. Muscle relaxants 4. Membrane stabilizers 5. Opioids 6. Physical therapy 7. Modalities (Heat, ice, etc.) 8. Invasive techniques such as nerve blocks. Danielle [name] has attained more than 50% relief of the pain from a series of diagnostic injections conducted in separate occasions.  Pain Score: Pre-procedure: 7  /10 Post-procedure: 0-No pain/10  Pre-op Assessment:  Danielle [name] is a 57 y.o. (year old), female patient, seen today for interventional treatment. She  has a past surgical history that includes necksurgery (10/21/2014); Back surgery (2013); Foot surgery (Right); Elbow surgery (Left); Carpal tunnel release (Right); Nose surgery; Partial hysterectomy; Total shoulder replacement (Bilateral); and Colonoscopy with propofol (N/A, 02/05/2017). Danielle [name] has a current medication list which includes the following prescription(s): albuterol, biotin, citalopram, cyanocobalamin, diclofenac sodium, docusate sodium, levothyroxine, magnesium hydroxide, naloxone, nystatin, oxycodone hcl, oxycodone hcl, spiriva handihaler, tizanidine, cetirizine, hydrocodone-acetaminophen, and oxycodone hcl. Her primarily concern today is the Back Pain  Initial Vital Signs:  Pulse/HCG Rate: 79ECG Heart Rate: 83 Temp: 98.2 F (36.8 C) Resp: 16 BP: 129/81 SpO2: 99 %  BMI: Estimated body mass index is 40.44 kg/m as calculated from the following:   Height as of this encounter: 5\' 5"  (1.651 m).   Weight as of this encounter: 243 lb (110.2 kg).  Risk Assessment: Allergies: Reviewed. She is allergic to aripiprazole; duloxetine; gabapentin; gold; iodinated diagnostic agents; naproxen sodium; nortriptyline hcl; nsaids; pregabalin; ace inhibitors; aspirin; cefpodoxime; fluoxetine; fluticasone-salmeterol; levofloxacin; lithium; meperidine; paroxetine; tape; telithromycin; theophylline; topiramate; trazodone; triamcinolone; venlafaxine; bupropion; ketorolac tromethamine; morphine; and moxifloxacin.  Allergy Precautions: None required Coagulopathies: Reviewed. None identified.  Blood-thinner therapy: None at this time Active Infection(s): Reviewed. None identified. Danielle [name] is afebrile  Site Confirmation: Danielle [name] was asked to confirm the procedure and laterality before marking the site Procedure checklist: Completed Consent:  Before the procedure and under the influence of no sedative(s), amnesic(s), or anxiolytics, the patient was informed of the treatment options, risks and possible complications. To fulfill our ethical and legal obligations, as recommended by the American Medical Association's Code of Ethics, I have informed the patient of my clinical impression;  the nature and purpose of the treatment or procedure; the risks, benefits, and possible complications of the intervention; the alternatives, including doing nothing; the risk(s) and benefit(s) of the alternative treatment(s) or procedure(s); and the risk(s) and benefit(s) of doing nothing. The patient was provided information about the general risks and possible complications associated with the procedure. These may include, but are not limited to: failure to achieve desired goals, infection, bleeding, organ or nerve damage, allergic reactions, paralysis, and death. In addition, the patient was informed of those risks and complications associated to Spine-related procedures, such as failure to decrease pain; infection (i.e.: Meningitis, epidural or intraspinal abscess); bleeding (i.e.: epidural hematoma, subarachnoid hemorrhage, or any other type of intraspinal or peri-dural bleeding); organ or nerve damage (i.e.: Any type of peripheral nerve, nerve root, or spinal cord injury) with subsequent damage to sensory, motor, and/or autonomic systems, resulting in permanent pain, numbness, and/or weakness of one or several areas of the body; allergic reactions; (i.e.: anaphylactic reaction); and/or death. Furthermore, the patient was informed of those risks and complications associated with the medications. These include, but are not limited to: allergic reactions (i.e.: anaphylactic or anaphylactoid reaction(s)); adrenal axis suppression; blood sugar elevation that in diabetics may result in ketoacidosis or comma; water retention that in patients with history of congestive heart  failure may result in shortness of breath, pulmonary edema, and decompensation with resultant heart failure; weight gain; swelling or edema; medication-induced neural toxicity; particulate matter embolism and blood vessel occlusion with resultant organ, and/or nervous system infarction; and/or aseptic necrosis of one or more joints. Finally, the patient was informed that Medicine is not an exact science; therefore, there is also the possibility of unforeseen or unpredictable risks and/or possible complications that may result in a catastrophic outcome. The patient indicated having understood very clearly. We have given the patient no guarantees and we have made no promises. Enough time was given to the patient to ask questions, all of which were answered to the patient's satisfaction. Ms. Graciano has indicated that she wanted to continue with the procedure. Attestation: I, the ordering provider, attest that I have discussed with the patient the benefits, risks, side-effects, alternatives, likelihood of achieving goals, and potential problems during recovery for the procedure that I have provided informed consent. Date  Time: 12/16/2017 10:41 AM  Pre-Procedure Preparation:  Monitoring: As per clinic protocol. Respiration, ETCO2, SpO2, BP, heart rate and rhythm monitor placed and checked for adequate function Safety Precautions: Patient was assessed for positional comfort and pressure points before starting the procedure. Time-out: I initiated and conducted the "Time-out" before starting the procedure, as per protocol. The patient was asked to participate by confirming the accuracy of the "Time Out" information. Verification of the correct person, site, and procedure were performed and confirmed by me, the nursing staff, and the patient. "Time-out" conducted as per Joint Commission's Universal Protocol (UP.01.01.01). Time: 1206  Description of Procedure:       Position: Prone Laterality: Right Levels:   L2, L3, L4, L5, & S1 Medial Branch Level(s), at the L3-4, L4-5, and the L5-S1 lumbar facet joints. Area Prepped: Lumbosacral Prepping solution: ChloraPrep (2% chlorhexidine gluconate and 70% isopropyl alcohol) Safety Precautions: Aspiration looking for blood return was conducted prior to all injections. At no point did we inject any substances, as a needle was being advanced. Before injecting, the patient was told to immediately notify me if she was experiencing any new onset of "ringing in the ears, or metallic taste in the mouth". No  attempts were made at seeking any paresthesias. Safe injection practices and needle disposal techniques used. Medications properly checked for expiration dates. SDV (single dose vial) medications used. After the completion of the procedure, all disposable equipment used was discarded in the proper designated medical waste containers. Local Anesthesia: Protocol guidelines were followed. The patient was positioned over the fluoroscopy table. The area was prepped in the usual manner. The time-out was completed. The target area was identified using fluoroscopy. A 12-in long, straight, sterile hemostat was used with fluoroscopic guidance to locate the targets for each level blocked. Once located, the skin was marked with an approved surgical skin marker. Once all sites were marked, the skin (epidermis, dermis, and hypodermis), as well as deeper tissues (fat, connective tissue and muscle) were infiltrated with a small amount of a short-acting local anesthetic, loaded on a 10cc syringe with a 25G, 1.5-in  Needle. An appropriate amount of time was allowed for local anesthetics to take effect before proceeding to the next step. Local Anesthetic: Lidocaine 2.0% The unused portion of the local anesthetic was discarded in the proper designated containers. Technical explanation of process:  Radiofrequency Ablation (RFA) L2 Medial Branch Nerve RFA: The target area for the L2 medial branch  is at the junction of the postero-lateral aspect of the superior articular process and the superior, posterior, and medial edge of the transverse process of L3. Under fluoroscopic guidance, a Radiofrequency needle was inserted until contact was made with os over the superior postero-lateral aspect of the pedicular shadow (target area). Sensory and motor testing was conducted to properly adjust the position of the needle. Once satisfactory placement of the needle was achieved, the numbing solution was slowly injected after negative aspiration for blood. 2.0 mL of the nerve block solution was injected without difficulty or complication. After waiting for at least 3 minutes, the ablation was performed. Once completed, the needle was removed intact. L3 Medial Branch Nerve RFA: The target area for the L3 medial branch is at the junction of the postero-lateral aspect of the superior articular process and the superior, posterior, and medial edge of the transverse process of L4. Under fluoroscopic guidance, a Radiofrequency needle was inserted until contact was made with os over the superior postero-lateral aspect of the pedicular shadow (target area). Sensory and motor testing was conducted to properly adjust the position of the needle. Once satisfactory placement of the needle was achieved, the numbing solution was slowly injected after negative aspiration for blood. 2.0 mL of the nerve block solution was injected without difficulty or complication. After waiting for at least 3 minutes, the ablation was performed. Once completed, the needle was removed intact. L4 Medial Branch Nerve RFA: The target area for the L4 medial branch is at the junction of the postero-lateral aspect of the superior articular process and the superior, posterior, and medial edge of the transverse process of L5. Under fluoroscopic guidance, a Radiofrequency needle was inserted until contact was made with os over the superior postero-lateral  aspect of the pedicular shadow (target area). Sensory and motor testing was conducted to properly adjust the position of the needle. Once satisfactory placement of the needle was achieved, the numbing solution was slowly injected after negative aspiration for blood. 2.0 mL of the nerve block solution was injected without difficulty or complication. After waiting for at least 3 minutes, the ablation was performed. Once completed, the needle was removed intact. L5 Medial Branch Nerve RFA: The target area for the L5 medial branch is at the  junction of the postero-lateral aspect of the superior articular process of S1 and the superior, posterior, and medial edge of the sacral ala. Under fluoroscopic guidance, a Radiofrequency needle was inserted until contact was made with os over the superior postero-lateral aspect of the pedicular shadow (target area). Sensory and motor testing was conducted to properly adjust the position of the needle. Once satisfactory placement of the needle was achieved, the numbing solution was slowly injected after negative aspiration for blood. 2.0 mL of the nerve block solution was injected without difficulty or complication. After waiting for at least 3 minutes, the ablation was performed. Once completed, the needle was removed intact. S1 Medial Branch Nerve RFA: The target area for the S1 medial branch is located inferior to the junction of the S1 superior articular process and the L5 inferior articular process, posterior, inferior, and lateral to the 6 o'clock position of the L5-S1 facet joint, just superior to the S1 posterior foramen. Under fluoroscopic guidance, the Radiofrequency needle was advanced until contact was made with os over the Target area. Sensory and motor testing was conducted to properly adjust the position of the needle. Once satisfactory placement of the needle was achieved, the numbing solution was slowly injected after negative aspiration for blood. 2.0 mL of the  nerve block solution was injected without difficulty or complication. After waiting for at least 3 minutes, the ablation was performed. Once completed, the needle was removed intact. Radiofrequency lesioning (ablation):  Radiofrequency Generator: NeuroTherm [id number] Sensory Stimulation Parameters: 50 Hz was used to locate & identify the nerve, making sure that the needle was positioned such that there was no sensory stimulation below 0.3 V or above 0.7 V. Motor Stimulation Parameters: 2 Hz was used to evaluate the motor component. Care was taken not to lesion any nerves that demonstrated motor stimulation of the lower extremities at an output of less than 2.5 times that of the sensory threshold, or a maximum of 2.0 V. Lesioning Technique Parameters: Standard Radiofrequency settings. (Not bipolar or pulsed.) Temperature Settings: 80 degrees C Lesioning time: 60 seconds Intra-operative Compliance: Compliant Materials & Medications: Needle(s) (Electrode/Cannula) Type: Teflon-coated, curved tip, Radiofrequency needle(s) Gauge: 22G Length: 10cm Numbing solution: 0.2% PF-Ropivacaine + Triamcinolone (40 mg/mL) diluted to a final concentration of 4 mg of Triamcinolone/mL of Ropivacaine The unused portion of the solution was discarded in the proper designated containers.  Once the entire procedure was completed, the treated area was cleaned, making sure to leave some of the prepping solution back to take advantage of its long term bactericidal properties.  Illustration of the posterior view of the lumbar spine and the posterior neural structures. Laminae of L2 through S1 are labeled. DPRL5, dorsal primary ramus of L5; DPRS1, dorsal primary ramus of S1; DPR3, dorsal primary ramus of L3; FJ, facet (zygapophyseal) joint L3-L4; I, inferior articular process of L4; LB1, lateral branch of dorsal primary ramus of L1; IAB, inferior articular branches from L3 medial branch (supplies L4-L5 facet joint); IBP,  intermediate branch plexus; MB3, medial branch of dorsal primary ramus of L3; NR3, third lumbar nerve root; S, superior articular process of L5; SAB, superior articular branches from L4 (supplies L4-5 facet joint also); TP3, transverse process of L3.  Vitals:   12/16/17 1250 12/16/17 1252 12/16/17 1300 12/16/17 1310  BP: 110/80 113/77 120/72 118/79  Pulse:      Resp: 17 14 15 12   Temp:    97.6 F (36.4 C)  TempSrc:    Temporal  SpO2: 98% 99% 96%  95%  Weight:      Height:        Start Time: 1206 hrs. End Time: 1245 hrs.  Imaging Guidance (Spinal):  Type of Imaging Technique: Fluoroscopy Guidance (Spinal) Indication(s): Assistance in needle guidance and placement for procedures requiring needle placement in or near specific anatomical locations not easily accessible without such assistance. Exposure Time: Please see nurses notes. Contrast: None used. Fluoroscopic Guidance: I was personally present during the use of fluoroscopy. "Tunnel Vision Technique" used to obtain the best possible view of the target area. Parallax error corrected before commencing the procedure. "Direction-depth-direction" technique used to introduce the needle under continuous pulsed fluoroscopy. Once target was reached, antero-posterior, oblique, and lateral fluoroscopic projection used confirm needle placement in all planes. Images permanently stored in EMR. Interpretation: No contrast injected. I personally interpreted the imaging intraoperatively. Adequate needle placement confirmed in multiple planes. Permanent images saved into the patient's record.  Antibiotic Prophylaxis:   Anti-infectives (From admission, onward)   None     Indication(s): None identified  Post-operative Assessment:  Post-procedure Vital Signs:  Pulse/HCG Rate: 7978 Temp: 97.6 F (36.4 C) Resp: 12 BP: 118/79 SpO2: 95 %  EBL: None  Complications: No immediate post-treatment complications observed by team, or reported by  patient.  Note: The patient tolerated the entire procedure well. A repeat set of vitals were taken after the procedure and the patient was kept under observation following institutional policy, for this type of procedure. Post-procedural neurological assessment was performed, showing return to baseline, prior to discharge. The patient was provided with post-procedure discharge instructions, including a section on how to identify potential problems. Should any problems arise concerning this procedure, the patient was given instructions to immediately contact us, at any time, without hesitation. In any case, we plan to contact the patient by telephone for a follow-up status report regarding this interventional procedure.  Comments:  No additional relevant information.  Plan of Care   Imaging Orders     DG C-Arm 1-60 Min-No Report  Procedure Orders     Radiofrequency,Lumbar  Medications ordered for procedure: Meds ordered this encounter  Medications  . lidocaine (XYLOCAINE) 2 % (with pres) injection 400 mg  . midazolam (VERSED) 5 MG/5ML injection 1-2 mg    Make sure Flumazenil is available in the pyxis when using this medication. If oversedation occurs, administer 0.2 mg IV over 15 sec. If after 45 sec no response, administer 0.2 mg again over 1 min; may repeat at 1 min intervals; not to exceed 4 doses (1 mg)  . fentaNYL (SUBLIMAZE) injection 25-50 mcg    Make sure Narcan is available in the pyxis when using this medication. In the event of respiratory depression (RR< 8/min): Titrate NARCAN (naloxone) in increments of 0.1 to 0.2 mg IV at 2-3 minute intervals, until desired degree of reversal.  . lactated ringers infusion 1,000 mL  . ropivacaine (PF) 2 mg/mL (0.2%) (NAROPIN) injection 9 mL  . triamcinolone acetonide (KENALOG-40) injection 40 mg  . HYDROcodone-acetaminophen (NORCO/VICODIN) 5-325 MG tablet    Sig: Take 1 tablet by mouth every 6 (six) hours as needed for up to 7 days for moderate  pain.    Dispense:  28 tablet    Refill:  0    For acute post-operative pain. Not to be refilled. To last 7 days.   Medications administered: We administered lidocaine, midazolam, fentaNYL, lactated ringers, ropivacaine (PF) 2 mg/mL (0.2%), and triamcinolone acetonide.  See the medical record for exact dosing, route, and time  of administration.  New Prescriptions   HYDROCODONE-ACETAMINOPHEN (NORCO/VICODIN) 5-325 MG TABLET    Take 1 tablet by mouth every 6 (six) hours as needed for up to 7 days for moderate pain.   Disposition: Discharge home  Discharge Date & Time: 12/16/2017; 1320 hrs.   Physician-requested Follow-up: Return for contralateral RFA (2 wks): (L) L-FCT RFA, w/ Dr. [name].  Future Appointments  Date Time Provider [place]  01/21/2018  2:30 PM Vevelyn Francois, NP [facility] None  02/12/2018 10:30 AM Milinda Pointer, MD [facility] None   Primary Care Physician: Marygrace Drought, MD Location: [facility] Outpatient Pain Management Facility Note by: Gaspar Cola, MD Date: 12/16/2017; Time: 12:48 PM  Disclaimer:  Medicine is not an Chief Strategy Officer. The only guarantee in medicine is that nothing is guaranteed. It is important to note that the decision to proceed with this intervention was based on the information collected from the patient. The Data and conclusions were drawn from the patient's questionnaire, the interview, and the physical examination. Because the information was provided in large part by the patient, it cannot be guaranteed that it has not been purposely or unconsciously manipulated. Every effort has been made to obtain as much relevant data as possible for this evaluation. It is important to note that the conclusions that lead to this procedure are derived in large part from the available data. Always take into account that the treatment will also be dependent on availability of resources and existing treatment guidelines, considered by other Pain Management  Practitioners as being common knowledge and practice, at the time of the intervention. For Medico-Legal purposes, it is also important to point out that variation in procedural techniques and pharmacological choices are the acceptable norm. The indications, contraindications, technique, and results of the above procedure should only be interpreted and judged by a Board-Certified Interventional Pain Specialist with extensive familiarity and expertise in the same exact procedure and technique.

## 2017-12-16 NOTE — Progress Notes (Signed)
Safety precautions to be maintained throughout the outpatient stay will include: orient to surroundings, keep bed in low position, maintain call bell within reach at all times, provide assistance with transfer out of bed and ambulation.  

## 2017-12-17 ENCOUNTER — Telehealth: Payer: Self-pay | Admitting: *Deleted

## 2017-12-17 NOTE — Telephone Encounter (Signed)
No problems post procedure. 

## 2018-01-21 ENCOUNTER — Other Ambulatory Visit: Payer: Self-pay

## 2018-01-21 ENCOUNTER — Ambulatory Visit: Payer: Medicaid Other | Attending: Nurse Practitioner | Admitting: Nurse Practitioner

## 2018-01-21 ENCOUNTER — Encounter: Payer: Self-pay | Admitting: Nurse Practitioner

## 2018-01-21 VITALS — BP 132/81 | HR 90 | Temp 98.5°F | Resp 18 | Ht 65.0 in | Wt 243.0 lb

## 2018-01-21 DIAGNOSIS — M47812 Spondylosis without myelopathy or radiculopathy, cervical region: Secondary | ICD-10-CM | POA: Insufficient documentation

## 2018-01-21 DIAGNOSIS — Z79891 Long term (current) use of opiate analgesic: Secondary | ICD-10-CM | POA: Diagnosis not present

## 2018-01-21 DIAGNOSIS — E785 Hyperlipidemia, unspecified: Secondary | ICD-10-CM | POA: Insufficient documentation

## 2018-01-21 DIAGNOSIS — M47892 Other spondylosis, cervical region: Secondary | ICD-10-CM

## 2018-01-21 DIAGNOSIS — G8929 Other chronic pain: Secondary | ICD-10-CM

## 2018-01-21 DIAGNOSIS — F17210 Nicotine dependence, cigarettes, uncomplicated: Secondary | ICD-10-CM | POA: Diagnosis not present

## 2018-01-21 DIAGNOSIS — Z6841 Body Mass Index (BMI) 40.0 and over, adult: Secondary | ICD-10-CM | POA: Diagnosis not present

## 2018-01-21 DIAGNOSIS — M797 Fibromyalgia: Secondary | ICD-10-CM | POA: Diagnosis not present

## 2018-01-21 DIAGNOSIS — M62830 Muscle spasm of back: Secondary | ICD-10-CM | POA: Diagnosis not present

## 2018-01-21 DIAGNOSIS — J449 Chronic obstructive pulmonary disease, unspecified: Secondary | ICD-10-CM | POA: Diagnosis not present

## 2018-01-21 DIAGNOSIS — F329 Major depressive disorder, single episode, unspecified: Secondary | ICD-10-CM | POA: Insufficient documentation

## 2018-01-21 DIAGNOSIS — M5441 Lumbago with sciatica, right side: Secondary | ICD-10-CM

## 2018-01-21 DIAGNOSIS — G894 Chronic pain syndrome: Secondary | ICD-10-CM | POA: Insufficient documentation

## 2018-01-21 DIAGNOSIS — Z5181 Encounter for therapeutic drug level monitoring: Secondary | ICD-10-CM | POA: Insufficient documentation

## 2018-01-21 DIAGNOSIS — F419 Anxiety disorder, unspecified: Secondary | ICD-10-CM | POA: Insufficient documentation

## 2018-01-21 DIAGNOSIS — M47816 Spondylosis without myelopathy or radiculopathy, lumbar region: Secondary | ICD-10-CM | POA: Diagnosis not present

## 2018-01-21 DIAGNOSIS — I10 Essential (primary) hypertension: Secondary | ICD-10-CM | POA: Diagnosis not present

## 2018-01-21 MED ORDER — OXYCODONE HCL 10 MG PO TABS: 10.0000 mg | ORAL_TABLET | Freq: Four times a day (QID) | ORAL | 0 refills | Status: DC | PRN

## 2018-01-21 MED ORDER — TIZANIDINE HCL 4 MG PO TABS: 4.0000 mg | ORAL_TABLET | Freq: Three times a day (TID) | ORAL | 5 refills | Status: DC | PRN

## 2018-01-21 NOTE — Patient Instructions (Addendum)
____________________________________________________________________________________________  Appointment Policy Summary  It is our goal and responsibility to provide the medical community with assistance in the evaluation and management of patients with chronic pain. Unfortunately our resources are limited. Because we do not have an unlimited amount of time, or available appointments, we are required to closely monitor and manage their use. The following rules exist to maximize their use:  Patient's responsibilities: 1. Punctuality:  At what time should I arrive? You should be physically present in our office 30 minutes before your scheduled appointment. Your scheduled appointment is with your assigned healthcare provider. However, it takes 5-10 minutes to be "checked-in", and another 15 minutes for the nurses to do the admission. If you arrive to our office at the time you were given for your appointment, you will end up being at least 20-25 minutes late to your appointment with the provider. 2. Tardiness:  What happens if I arrive only a few minutes after my scheduled appointment time? You will need to reschedule your appointment. The cutoff is your appointment time. This is why it is so important that you arrive at least 30 minutes before that appointment. If you have an appointment scheduled for 10:00 AM and you arrive at 10:01, you will be required to reschedule your appointment.  3. Plan ahead:  Always assume that you will encounter traffic on your way in. Plan for it. If you are dependent on a driver, make sure they understand these rules and the need to arrive early. 4. Other appointments and responsibilities:  Avoid scheduling any other appointments before or after your pain clinic appointments.  5. Be prepared:  Write down everything that you need to discuss with your healthcare provider and give this information to the admitting nurse. Write down the medications that you will need  refilled. Bring your pills and bottles (even the empty ones), to all of your appointments, except for those where a procedure is scheduled. 6. No children or pets:  Find someone to take care of them. It is not appropriate to bring them in. 7. Scheduling changes:  We request "advanced notification" of any changes or cancellations. 8. Advanced notification:  Defined as a time period of more than 24 hours prior to the originally scheduled appointment. This allows for the appointment to be offered to other patients. 9. Rescheduling:  When a visit is rescheduled, it will require the cancellation of the original appointment. For this reason they both fall within the category of "Cancellations".  10. Cancellations:  They require advanced notification. Any cancellation less than 24 hours before the  appointment will be recorded as a "No Show". 11. No Show:  Defined as an unkept appointment where the patient failed to notify or declare to the practice their intention or inability to keep the appointment.  Corrective process for repeat offenders:  1. Tardiness: Three (3) episodes of rescheduling due to late arrivals will be recorded as one (1) "No Show". 2. Cancellation or reschedule: Three (3) cancellations or rescheduling will be recorded as one (1) "No Show". 3. "No Shows": Three (3) "No Shows" within a 12 month period will result in discharge from the practice. ____________________________________________________________________________________________  ____________________________________________________________________________________________  Pain Scale  Introduction: The pain score used by this practice is the Verbal Numerical Rating Scale (VNRS-11). This is an 11-point scale. It is for adults and children 10 years or older. There are significant differences in how the pain score is reported, used, and applied. Forget everything you learned in the past and learn  this scoring system.  General  Information: The scale should reflect your current level of pain. Unless you are specifically asked for the level of your worst pain, or your average pain. If you are asked for one of these two, then it should be understood that it is over the past 24 hours.  Basic Activities of Daily Living (ADL): Personal hygiene, dressing, eating, transferring, and using restroom.  Instructions: Most patients tend to report their level of pain as a combination of two factors, their physical pain and their psychosocial pain. This last one is also known as "suffering" and it is reflection of how physical pain affects you socially and psychologically. From now on, report them separately. From this point on, when asked to report your pain level, report only your physical pain. Use the following table for reference.  Pain Clinic Pain Levels (0-5/10)  Pain Level Score  Description  No Pain 0   Mild pain 1 Nagging, annoying, but does not interfere with basic activities of daily living (ADL). Patients are able to eat, bathe, get dressed, toileting (being able to get on and off the toilet and perform personal hygiene functions), transfer (move in and out of bed or a chair without assistance), and maintain continence (able to control bladder and bowel functions). Blood pressure and heart rate are unaffected. A normal heart rate for a healthy adult ranges from 60 to 100 bpm (beats per minute).   Mild to moderate pain 2 Noticeable and distracting. Impossible to hide from other people. More frequent flare-ups. Still possible to adapt and function close to normal. It can be very annoying and may have occasional stronger flare-ups. With discipline, patients may get used to it and adapt.   Moderate pain 3 Interferes significantly with activities of daily living (ADL). It becomes difficult to feed, bathe, get dressed, get on and off the toilet or to perform personal hygiene functions. Difficult to get in and out of bed or a chair  without assistance. Very distracting. With effort, it can be ignored when deeply involved in activities.   Moderately severe pain 4 Impossible to ignore for more than a few minutes. With effort, patients may still be able to manage work or participate in some social activities. Very difficult to concentrate. Signs of autonomic nervous system discharge are evident: dilated pupils (mydriasis); mild sweating (diaphoresis); sleep interference. Heart rate becomes elevated (>115 bpm). Diastolic blood pressure (lower number) rises above 100 mmHg. Patients find relief in laying down and not moving.   Severe pain 5 Intense and extremely unpleasant. Associated with frowning face and frequent crying. Pain overwhelms the senses.  Ability to do any activity or maintain social relationships becomes significantly limited. Conversation becomes difficult. Pacing back and forth is common, as getting into a comfortable position is nearly impossible. Pain wakes you up from deep sleep. Physical signs will be obvious: pupillary dilation; increased sweating; goosebumps; brisk reflexes; cold, clammy hands and feet; nausea, vomiting or dry heaves; loss of appetite; significant sleep disturbance with inability to fall asleep or to remain asleep. When persistent, significant weight loss is observed due to the complete loss of appetite and sleep deprivation.  Blood pressure and heart rate becomes significantly elevated. Caution: If elevated blood pressure triggers a pounding headache associated with blurred vision, then the patient should immediately seek attention at an urgent or emergency care unit, as these may be signs of an impending stroke.    Emergency Department Pain Levels (6-10/10)  Emergency Room Pain 6 Severely   limiting. Requires emergency care and should not be seen or managed at an outpatient pain management facility. Communication becomes difficult and requires great effort. Assistance to reach the emergency department  may be required. Facial flushing and profuse sweating along with potentially dangerous increases in heart rate and blood pressure will be evident.   Distressing pain 7 Self-care is very difficult. Assistance is required to transport, or use restroom. Assistance to reach the emergency department will be required. Tasks requiring coordination, such as bathing and getting dressed become very difficult.   Disabling pain 8 Self-care is no longer possible. At this level, pain is disabling. The individual is unable to do even the most "basic" activities such as walking, eating, bathing, dressing, transferring to a bed, or toileting. Fine motor skills are lost. It is difficult to think clearly.   Incapacitating pain 9 Pain becomes incapacitating. Thought processing is no longer possible. Difficult to remember your own name. Control of movement and coordination are lost.   The worst pain imaginable 10 At this level, most patients pass out from pain. When this level is reached, collapse of the autonomic nervous system occurs, leading to a sudden drop in blood pressure and heart rate. This in turn results in a temporary and dramatic drop in blood flow to the brain, leading to a loss of consciousness. Fainting is one of the body's self defense mechanisms. Passing out puts the brain in a calmed state and causes it to shut down for a while, in order to begin the healing process.    Summary: 1. Refer to this scale when providing [place] with your pain level. 2. Be accurate and careful when reporting your pain level. This will help with your care. 3. Over-reporting your pain level will lead to loss of credibility. 4. Even a level of 1/10 means that there is pain and will be treated at our facility. 5. High, inaccurate reporting will be documented as "Symptom Exaggeration", leading to loss of credibility and suspicions of possible secondary gains such as obtaining more narcotics, or wanting to appear disabled, for  fraudulent reasons. 6. Only pain levels of 5 or below will be seen at our facility. 7. Pain levels of 6 and above will be sent to the Emergency Department and the appointment cancelled. ____________________________________________________________________________________________   BMI Assessment: Estimated body mass index is 40.44 kg/m as calculated from the following:   Height as of this encounter: 5\' 5"  (1.651 m).   Weight as of this encounter: 243 lb (110.2 kg).  BMI interpretation table: BMI level Category Range association with higher incidence of chronic pain  <18 kg/m2 Underweight   18.5-24.9 kg/m2 Ideal body weight   25-29.9 kg/m2 Overweight Increased incidence by 20%  30-34.9 kg/m2 Obese (Class I) Increased incidence by 68%  35-39.9 kg/m2 Severe obesity (Class II) Increased incidence by 136%  >40 kg/m2 Extreme obesity (Class III) Increased incidence by 254%   Patient's current BMI Ideal Body weight  Body mass index is 40.44 kg/m. Ideal body weight: 57 kg (125 lb 10.6 oz) Adjusted ideal body weight: 78.3 kg (172 lb 9.6 oz)   BMI Readings from Last 4 Encounters:  01/21/18 40.44 kg/m  12/16/17 40.44 kg/m  10/22/17 39.94 kg/m  09/30/17 40.44 kg/m   Wt Readings from Last 4 Encounters:  01/21/18 243 lb (110.2 kg)  12/16/17 243 lb (110.2 kg)  10/22/17 240 lb (108.9 kg)  09/30/17 243 lb (110.2 kg)

## 2018-01-21 NOTE — Progress Notes (Signed)
Nursing Pain Medication Assessment:  Safety precautions to be maintained throughout the outpatient stay will include: orient to surroundings, keep bed in low position, maintain call bell within reach at all times, provide assistance with transfer out of bed and ambulation.  Medication Inspection Compliance: Pill count conducted under aseptic conditions, in front of the patient. Neither the pills nor the bottle was removed from the patient's sight at any time. Once count was completed pills were immediately returned to the patient in their original bottle.  Medication: Oxycodone IR Pill/Patch Count: 11 of 120 pills remain Pill/Patch Appearance: Markings consistent with prescribed medication Bottle Appearance: Standard pharmacy container. Clearly labeled. Filled Date: 06 / 12 / 2019 Last Medication intake:  Today

## 2018-01-21 NOTE — Progress Notes (Signed)
Patient's Name: Danielle [name]  MRN: [id number]  Referring Provider: Marygrace Drought, MD  DOB: 15-Mar-1961  PCP: Marygrace Drought, MD  DOS: 01/21/2018  Note by: Vevelyn Francois NP  Service setting: Ambulatory outpatient  Specialty: Interventional Pain Management  Location: [facility] (AMB) Pain Management Facility    Patient type: Established    Primary Reason(s) for Visit: Encounter for prescription drug management. (Level of risk: moderate)  CC: Back Pain (low and left); Hip Pain (left); and Shoulder Pain (left)  HPI  Danielle [name] is a 57 y.o. year old, female patient, who comes today for a medication management evaluation. She has Long term prescription opiate use; Opiate use (60 MME/Day); B12 deficiency; Cervical spinal cord compression [facility]) (April, 2017); Cervical spinal stenosis; Chronic obstructive pulmonary disease ([place]); Clinical depression; Adult hypothyroidism; Adiposity; Mucositis oral; Groin pain; Current tobacco use; Chronic shoulder pain (Primary Source of Pain) (Bilateral) (R>L); History of total shoulder replacement (Bilateral); Chronic low back pain [facility] source of pain) (Bilateral) (R>L); Lumbar facet syndrome (Bilateral) (R>L); Lumbar spondylosis; Failed back surgical syndrome; Chronic lower extremity pain (Right); Chronic neck pain (Bilateral) (R>L); Chronic cervical radicular pain (Right); Ulnar neuropathy (Left); Hx of cervical spine surgery; Cervical spondylosis; Encounter for therapeutic drug level monitoring; Encounter for pain management planning; Fibromyalgia; Chronic sacroiliac pain (Bilateral) (R>L); Seasonal allergic rhinitis; S/P total replacement of shoulder (Right); Hypertension; Chronic pain syndrome; Complaints of weakness of lower extremity; Morbid obesity with BMI of 40.0-44.9, adult ([place]); Anterolisthesis; Cervical facet syndrome ([place]); Chronic musculoskeletal pain; Muscle spasm of back; Encounter for screening colonoscopy; Rectal bleeding; Mixed simple and  mucopurulent chronic bronchitis ([place]); Acute postoperative pain; Nausea; Vasovagal episode; Myofascial pain; Chronic hip pain (Secondary source of pain) (Bilateral) (R>L); 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 Her primarily concern today is the Back Pain (low and left); Hip Pain (left); and Shoulder Pain (left)  Pain Assessment: Location: Left Back Radiating: left hip and left thigh Onset: More than a month ago Duration: Chronic pain Quality: Tingling, Numbness, Constant, Aching, Dull, Sore Severity: 5 /10 (subjective, self-reported pain score)  Note: Reported level is compatible with observation.                          Effect on ADL:   Timing: Constant Modifying factors: medicatons, heat,  BP: 132/81  HR: 90  Danielle [name] was last scheduled for an appointment on 10/22/2017 for medication management. During today's appointment we reviewed Danielle [name]'s chronic pain status, as well as her outpatient medication regimen. She admits that she had increased pain secondary to preparing for a baby shower. She admits that her left thigh is numb and tingling. She states that the right Lumbar RFA was effective.  The patient  reports that she does not use drugs. Her body mass index is 40.44 kg/m.  Further details on both, my assessment(s), as well as the proposed treatment plan, please see below.  Controlled Substance Pharmacotherapy Assessment REMS (Risk Evaluation and Mitigation Strategy)  Analgesic:Oxycodone IR 10 mg every 6 hours (40 mg/dayof oxycodone) MME/day:69m/day   SHart Rochester RN  01/21/2018  2:31 PM  Sign at close encounter Nursing Pain Medication Assessment:  Safety precautions to be maintained throughout the outpatient  stay will include: orient to surroundings, keep bed in low position, maintain  call bell within reach at all times, provide assistance with transfer out of bed and ambulation.  Medication Inspection Compliance: Pill count conducted under aseptic conditions, in front of the patient. Neither the pills nor the bottle was removed from the patient's sight at any time. Once count was completed pills were immediately returned to the patient in their original bottle.  Medication: Oxycodone IR Pill/Patch Count: 11 of 120 pills remain Pill/Patch Appearance: Markings consistent with prescribed medication Bottle Appearance: Standard pharmacy container. Clearly labeled. Filled Date: 06 / 12 / 2019 Last Medication intake:  Today   Pharmacokinetics: Liberation and absorption (onset of action): WNL Distribution (time to peak effect): WNL Metabolism and excretion (duration of action): WNL         Pharmacodynamics: Desired effects: Analgesia: Danielle [name] reports >50% benefit. Functional ability: Patient reports that medication allows her to accomplish basic ADLs Clinically meaningful improvement in function (CMIF): Sustained CMIF goals met Perceived effectiveness: Described as relatively effective, allowing for increase in activities of daily living (ADL) Undesirable effects: Side-effects or Adverse reactions: None reported Monitoring: [place] PMP: Online review of the past 55-monthperiod conducted. Compliant with practice rules and regulations Last UDS on record: Summary  Date Value Ref Range Status  10/22/2017 FINAL  Final    Comment:    ==================================================================== TOXASSURE SELECT 13 (MW) ==================================================================== Test                             Result       Flag       Units Drug Present and Declared for Prescription Verification   Oxycodone                      >3497        EXPECTED   ng/mg creat   Oxymorphone                     224          EXPECTED   ng/mg creat   Noroxycodone                   >3497        EXPECTED   ng/mg creat   Noroxymorphone                 55           EXPECTED   ng/mg creat    Sources of oxycodone are scheduled prescription medications.    Oxymorphone, noroxycodone, and noroxymorphone are expected    metabolites of oxycodone. Oxymorphone is also available as a    scheduled prescription medication. ==================================================================== Test                      Result    Flag   Units      Ref Range   Creatinine              286              mg/dL      >=20 ==================================================================== Declared Medications:  The flagging and interpretation on this report are based on the  following declared medications.  Unexpected results may arise from  inaccuracies in the declared medications.  **Note: The testing scope of this panel includes these medications:  Oxycodone  **Note: The testing scope of this panel does not include following  reported medications:  Albuterol  Cetirizine (Zyrtec)  Citalopram (Celexa)  Diclofenac (Voltaren)  Docusate (Colace)  Levothyroxine  Magnesium (Milk of Magnesia)  Naloxone (Narcan)  Nystatin  Tiotropium (Spiriva)  Tizanidine (Zanaflex)  Vitamin B (Biotin)  Vitamin B12 ==================================================================== For clinical consultation, please call [phone]. ====================================================================    UDS interpretation: Compliant          Medication Assessment Form: Reviewed. Patient indicates being compliant with therapy Treatment compliance: Compliant Risk Assessment Profile: Aberrant behavior: See prior evaluations. None observed or detected today Comorbid factors increasing risk of overdose: See prior notes. No additional risks detected today Risk of substance use disorder (SUD): Low Opioid Risk Tool -  01/21/18 1439      Family History of Substance Abuse   Alcohol  Negative    Illegal Drugs  Negative    Rx Drugs  Negative      Personal History of Substance Abuse   Alcohol  Negative    Illegal Drugs  Negative    Rx Drugs  Negative      Age   Age between 46-45 years   No      History of Preadolescent Sexual Abuse   History of Preadolescent Sexual Abuse  Negative or Female      Psychological Disease   Psychological Disease  Negative    Depression  Positive      Total Score   Opioid Risk Tool Scoring  1    Opioid Risk Interpretation  Low Risk      ORT Scoring interpretation table:  Score <3 = Low Risk for SUD  Score between 4-7 = Moderate Risk for SUD  Score >8 = High Risk for Opioid Abuse   Risk Mitigation Strategies:  Patient Counseling: Covered Patient-Prescriber Agreement (PPA): Present and active  Notification to other healthcare providers: Done  Pharmacologic Plan: No change in therapy, at this time.             Laboratory Chemistry  Inflammation Markers (CRP: Acute Phase) (ESR: Chronic Phase) Lab Results  Component Value Date   CRP 0.9 01/08/2016   ESRSEDRATE 37 (H) 01/08/2016                         Rheumatology Markers No results found for: RF, ANA, LABURIC, URICUR, LYMEIGGIGMAB, LYMEABIGMQN, HLAB27                      Renal Function Markers Lab Results  Component Value Date   BUN 8 01/08/2016   CREATININE 0.94 01/08/2016   GFRAA >60 01/08/2016   GFRNONAA >60 01/08/2016                             Hepatic Function Markers Lab Results  Component Value Date   AST 18 01/08/2016   ALT 14 01/08/2016   ALBUMIN 3.7 01/08/2016   ALKPHOS 79 01/08/2016                        Electrolytes Lab Results  Component Value Date   NA 137 01/08/2016   K 3.5 01/08/2016   CL 101 01/08/2016   CALCIUM 9.0 01/08/2016   MG 2.1 01/08/2016                        Neuropathy Markers Lab Results  Component Value Date   VITAMINB12 >7,500 (H) 01/08/2016  Bone Pathology Markers Lab Results  Component Value Date   25OHVITD1 32 01/08/2016   25OHVITD2 2.0 01/08/2016   25OHVITD3 30 01/08/2016                         Coagulation Parameters No results found for: INR, LABPROT, APTT, PLT, DDIMER                      Cardiovascular Markers No results found for: BNP, CKTOTAL, CKMB, TROPONINI, HGB, HCT                       CA Markers No results found for: CEA, CA125, LABCA2                      Note: Lab results reviewed.  Recent Diagnostic Imaging Results  DG C-Arm 1-60 Min-No Report Fluoroscopy was utilized by the requesting physician.  No radiographic  interpretation.   Complexity Note: Imaging results reviewed. Results shared with Danielle [name], using Layman's terms.                         Meds   Current Outpatient Medications:  .  albuterol (PROVENTIL HFA;VENTOLIN HFA) 108 (90 Base) MCG/ACT inhaler, Inhale 2 puffs into the lungs every 4 (four) hours as needed for wheezing or shortness of breath. , Disp: , Rfl:  .  cetirizine (ZYRTEC) 10 MG tablet, Take 10 mg by mouth daily. , Disp: , Rfl:  .  citalopram (CELEXA) 40 MG tablet, Take 40 mg by mouth daily., Disp: , Rfl: 3 .  diclofenac sodium (VOLTAREN) 1 % GEL, Apply 2 g topically 4 (four) times daily., Disp: 100 g, Rfl: 2 .  docusate sodium (COLACE) 100 MG capsule, Take 100 mg by mouth 2 (two) times daily., Disp: , Rfl:  .  levothyroxine (SYNTHROID, LEVOTHROID) 50 MCG tablet, TAKE 1 TABLET (50 MCG TOTAL) BY MOUTH DAILY AT 0600., Disp: , Rfl: 11 .  magnesium hydroxide (MILK OF MAGNESIA) 400 MG/5ML suspension, Take by mouth., Disp: , Rfl:  .  naloxone (NARCAN) 2 MG/2ML injection, Inject content of syringe into thigh muscle. Call 911., Disp: 2 Syringe, Rfl: 1 .  nystatin (MYCOSTATIN/NYSTOP) powder, Apply topically., Disp: , Rfl:  .  [START ON 02/22/2018] Oxycodone HCl 10 MG TABS, Take 1 tablet (10 mg total) by mouth every 6 (six) hours as needed., Disp: 120 tablet, Rfl:  0 .  SPIRIVA HANDIHALER 18 MCG inhalation capsule, PLACE 1 CAPSULE (18 MCG TOTAL) INTO INHALER AND INHALE ONCE DAILY., Disp: , Rfl: 5 .  tiZANidine (ZANAFLEX) 4 MG tablet, Take 1 tablet (4 mg total) by mouth every 8 (eight) hours as needed for muscle spasms., Disp: 90 tablet, Rfl: 5 .  [START ON 03/24/2018] Oxycodone HCl 10 MG TABS, Take 1 tablet (10 mg total) by mouth every 6 (six) hours as needed., Disp: 120 tablet, Rfl: 0 .  [START ON 01/23/2018] Oxycodone HCl 10 MG TABS, Take 1 tablet (10 mg total) by mouth every 6 (six) hours as needed., Disp: 120 tablet, Rfl: 0  ROS  Constitutional: Denies any fever or chills Gastrointestinal: No reported hemesis, hematochezia, vomiting, or acute GI distress Musculoskeletal: Denies any acute onset joint swelling, redness, loss of ROM, or weakness Neurological: No reported episodes of acute onset apraxia, aphasia, dysarthria, agnosia, amnesia, paralysis, loss of coordination, or loss of consciousness  Allergies  Danielle [name] is allergic  to aripiprazole; duloxetine; gabapentin; gold; iodinated diagnostic agents; naproxen sodium; nortriptyline hcl; nsaids; pregabalin; ace inhibitors; aspirin; cefpodoxime; fluoxetine; fluticasone-salmeterol; levofloxacin; lithium; meperidine; paroxetine; tape; telithromycin; theophylline; topiramate; trazodone; triamcinolone; venlafaxine; bupropion; ketorolac tromethamine; morphine; and moxifloxacin.  [place]  Drug: Danielle [name]  reports that she does not use drugs. Alcohol:  reports that she does not drink alcohol. Tobacco:  reports that she has been smoking cigarettes.  She has a 40.00 pack-year smoking history. She has never used smokeless tobacco. Medical:  has a past medical history of Anxiety, Arthritis, Asthma, Bell's palsy, Bursitis, COPD (chronic obstructive pulmonary disease) ([place]), Depression, Fibromyalgia, Heart murmur, Hepatitis C, Hiatal hernia, Hyperlipidemia, Hypertension, IBS (irritable bowel syndrome), Insomnia, and  Thyroid disease. Surgical: Danielle [name]  has a past surgical history that includes necksurgery (10/21/2014); Back surgery (2013); Foot surgery (Right); Elbow surgery (Left); Carpal tunnel release (Right); Nose surgery; Partial hysterectomy; Total shoulder replacement (Bilateral); and Colonoscopy with propofol (N/A, 02/05/2017). Family: family history includes Cancer in her mother; Gout in her mother; Heart disease in her father.  Constitutional Exam  General appearance: Well nourished, well developed, and well hydrated. In no apparent acute distress Vitals:   01/21/18 1429  BP: 132/81  Pulse: 90  Resp: 18  Temp: 98.5 F (36.9 C)  TempSrc: Oral  SpO2: 99%  Weight: 243 lb (110.2 kg)  Height: '5\' 5"'  (1.651 m)  Psych/Mental status: Alert, oriented x 3 (person, place, & time)       Eyes: PERLA Respiratory: No evidence of acute respiratory distress  Lumbar Spine Area Exam  Skin & Axial Inspection: No masses, redness, or swelling Alignment: Symmetrical Functional ROM: Unrestricted ROM       Stability: No instability detected Muscle Tone/Strength: Functionally intact. No obvious neuro-muscular anomalies detected. Sensory (Neurological): Unimpaired Palpation: Tender       Provocative Tests: Lumbar Hyperextension/rotation test: deferred today       Lumbar quadrant test (Kemp's test): deferred today       Lumbar Lateral bending test: deferred today       Patrick's Maneuver: deferred today                   FABER test: deferred today       Thigh-thrust test: deferred today       S-I compression test: deferred today       S-I distraction test: deferred today        Gait & Posture Assessment  Ambulation: Unassisted Gait: Relatively normal for age and body habitus Posture: WNL   Lower Extremity Exam    Side: Right lower extremity  Side: Left lower extremity  Stability: No instability observed          Stability: No instability observed          Skin & Extremity Inspection: Skin color,  temperature, and hair growth are WNL. No peripheral edema or cyanosis. No masses, redness, swelling, asymmetry, or associated skin lesions. No contractures.  Skin & Extremity Inspection: Skin color, temperature, and hair growth are WNL. No peripheral edema or cyanosis. No masses, redness, swelling, asymmetry, or associated skin lesions. No contractures.  Functional ROM: Unrestricted ROM                  Functional ROM: Unrestricted ROM                  Muscle Tone/Strength: Functionally intact. No obvious neuro-muscular anomalies detected.  Muscle Tone/Strength: Functionally intact. No obvious neuro-muscular anomalies detected.  Sensory (Neurological): Unimpaired  Sensory (Neurological): Unimpaired  Palpation: No palpable anomalies  Palpation: No palpable anomalies   Assessment  Primary Diagnosis & Pertinent Problem List: The primary encounter diagnosis was Chronic low back pain (Location of Tertiary source of pain) (Bilateral) (R>L). Diagnoses of Lumbar spondylosis, Fibromyalgia, Other osteoarthritis of spine, cervical region, Chronic pain syndrome, and Muscle spasm of back were also pertinent to this visit.  Status Diagnosis  Controlled Controlled Controlled 1. Chronic low back pain (Location of Tertiary source of pain) (Bilateral) (R>L)   2. Lumbar spondylosis   3. Fibromyalgia   4. Other osteoarthritis of spine, cervical region   5. Chronic pain syndrome   6. Muscle spasm of back     Problems updated and reviewed during this visit: No problems updated. Plan of Care  Pharmacotherapy (Medications Ordered): Meds ordered this encounter  Medications  . Oxycodone HCl 10 MG TABS    Sig: Take 1 tablet (10 mg total) by mouth every 6 (six) hours as needed.    Dispense:  120 tablet    Refill:  0    Do not add this medication to the electronic "Automatic Refill" notification system. Patient may have prescription filled one day early if pharmacy is closed on scheduled refill date. Do not fill  until: 03/24/2018 To last until:04/23/2018    Order Specific Question:   Supervising Provider    Answer:   Milinda Pointer [phone]  . Oxycodone HCl 10 MG TABS    Sig: Take 1 tablet (10 mg total) by mouth every 6 (six) hours as needed.    Dispense:  120 tablet    Refill:  0    Do not add this medication to the electronic "Automatic Refill" notification system. Patient may have prescription filled one day early if pharmacy is closed on scheduled refill date. Do not fill until: 02/22/2018 To last until:03/24/2018    Order Specific Question:   Supervising Provider    Answer:   Milinda Pointer [phone]  . Oxycodone HCl 10 MG TABS    Sig: Take 1 tablet (10 mg total) by mouth every 6 (six) hours as needed.    Dispense:  120 tablet    Refill:  0    Do not add this medication to the electronic "Automatic Refill" notification system. Patient may have prescription filled one day early if pharmacy is closed on scheduled refill date. Do not fill until:01/23/2018 To last until: 02/22/2018    Order Specific Question:   Supervising Provider    Answer:   Milinda Pointer [phone]  . tiZANidine (ZANAFLEX) 4 MG tablet    Sig: Take 1 tablet (4 mg total) by mouth every 8 (eight) hours as needed for muscle spasms.    Dispense:  90 tablet    Refill:  5    Do not place this medication, or any other prescription from our practice, on "Automatic Refill". Patient may have prescription filled one day early if pharmacy is closed on scheduled refill date.    Order Specific Question:   Supervising Provider    Answer:   Milinda Pointer [[id number]]   New Prescriptions   No medications on file   Medications administered today: Lafern A. Domingo [name] "ANGIE" had no medications administered during this visit. Lab-work, procedure(s), and/or referral(s): No orders of the defined types were placed in this encounter.  Imaging and/or referral(s): None   Interventional therapies: Planned, scheduled, and/or pending:   Leftlumbar facet radiofrequencyablation   Considering:  Palliative bilateralsuprascapular nerve block Possible bilateral suprascapular nerve radiofrequencyablation Diagnostic bilateral  intra-articular hip joint injection Possible bilateral hip joint radiofrequencyablation Diagnostic bilateral lumbar facet block Possible bilaterallumbar facet radiofrequencyablation Diagnostic caudal epiduralsteroid injection + diagnostic epidurogram Possible Racz procedure. Diagnostic bilateral cervical facet block Possible bilateral cervical facet radiofrequencyablation Diagnostic right-sided cervical epidural steroid injection Diagnostic bilateral sacroiliac joint block Possible bilateral sacroiliac joint RFA   Palliative PRN treatment(s):  Palliative bilateralsuprascapular nerve block Diagnostic bilateral intra-articular hip joint injection Diagnostic bilateral lumbar facet block Diagnostic caudal epiduralsteroid injection + diagnostic epidurogram Diagnostic bilateral cervical facet block Diagnostic right-sided cervical epidural steroid injection Diagnostic bilateral sacroiliac joint block      Provider-requested follow-up: Return in about 3 months (around 04/23/2018).  Future Appointments  Date Time Provider [place]  02/12/2018 10:30 AM Milinda Pointer, MD [facility] None  04/21/2018  1:30 PM Vevelyn Francois, NP [facility] None   Primary Care Physician: Marygrace Drought, MD Location: [facility] Outpatient Pain Management Facility Note by: Vevelyn Francois NP Date: 01/21/2018; Time: 12:15 PM  Pain Score Disclaimer: We use the NRS-11 scale. This is a self-reported, subjective measurement of pain severity with only modest accuracy. It is used primarily to identify changes within a particular patient. It must be understood that outpatient pain scales are significantly less accurate that those used for research, where they can be applied under ideal controlled circumstances  with minimal exposure to variables. In reality, the score is likely to be a combination of pain intensity and pain affect, where pain affect describes the degree of emotional arousal or changes in action readiness caused by the sensory experience of pain. Factors such as social and work situation, setting, emotional state, anxiety levels, expectation, and prior pain experience may influence pain perception and show large inter-individual differences that may also be affected by time variables.  Patient instructions provided during this appointment: Patient Instructions   ____________________________________________________________________________________________  Appointment Policy Summary  It is our goal and responsibility to provide the medical community with assistance in the evaluation and management of patients with chronic pain. Unfortunately our resources are limited. Because we do not have an unlimited amount of time, or available appointments, we are required to closely monitor and manage their use. The following rules exist to maximize their use:  Patient's responsibilities: 1. Punctuality:  At what time should I arrive? You should be physically present in our office 30 minutes before your scheduled appointment. Your scheduled appointment is with your assigned healthcare provider. However, it takes 5-10 minutes to be "checked-in", and another 15 minutes for the nurses to do the admission. If you arrive to our office at the time you were given for your appointment, you will end up being at least 20-25 minutes late to your appointment with the provider. 2. Tardiness:  What happens if I arrive only a few minutes after my scheduled appointment time? You will need to reschedule your appointment. The cutoff is your appointment time. This is why it is so important that you arrive at least 30 minutes before that appointment. If you have an appointment scheduled for 10:00 AM and you arrive at 10:01,  you will be required to reschedule your appointment.  3. Plan ahead:  Always assume that you will encounter traffic on your way in. Plan for it. If you are dependent on a driver, make sure they understand these rules and the need to arrive early. 4. Other appointments and responsibilities:  Avoid scheduling any other appointments before or after your pain clinic appointments.  5. Be prepared:  Write down everything that you need  to discuss with your healthcare provider and give this information to the admitting nurse. Write down the medications that you will need refilled. Bring your pills and bottles (even the empty ones), to all of your appointments, except for those where a procedure is scheduled. 6. No children or pets:  Find someone to take care of them. It is not appropriate to bring them in. 7. Scheduling changes:  We request "advanced notification" of any changes or cancellations. 8. Advanced notification:  Defined as a time period of more than 24 hours prior to the originally scheduled appointment. This allows for the appointment to be offered to other patients. 9. Rescheduling:  When a visit is rescheduled, it will require the cancellation of the original appointment. For this reason they both fall within the category of "Cancellations".  10. Cancellations:  They require advanced notification. Any cancellation less than 24 hours before the  appointment will be recorded as a "No Show". 11. No Show:  Defined as an unkept appointment where the patient failed to notify or declare to the practice their intention or inability to keep the appointment.  Corrective process for repeat offenders:  1. Tardiness: Three (3) episodes of rescheduling due to late arrivals will be recorded as one (1) "No Show". 2. Cancellation or reschedule: Three (3) cancellations or rescheduling will be recorded as one (1) "No Show". 3. "No Shows": Three (3) "No Shows" within a 12 month period will result in  discharge from the practice. ____________________________________________________________________________________________  ____________________________________________________________________________________________  Pain Scale  Introduction: The pain score used by this practice is the Verbal Numerical Rating Scale (VNRS-11). This is an 11-point scale. It is for adults and children 10 years or older. There are significant differences in how the pain score is reported, used, and applied. Forget everything you learned in the past and learn this scoring system.  General Information: The scale should reflect your current level of pain. Unless you are specifically asked for the level of your worst pain, or your average pain. If you are asked for one of these two, then it should be understood that it is over the past 24 hours.  Basic Activities of Daily Living (ADL): Personal hygiene, dressing, eating, transferring, and using restroom.  Instructions: Most patients tend to report their level of pain as a combination of two factors, their physical pain and their psychosocial pain. This last one is also known as "suffering" and it is reflection of how physical pain affects you socially and psychologically. From now on, report them separately. From this point on, when asked to report your pain level, report only your physical pain. Use the following table for reference.  Pain Clinic Pain Levels (0-5/10)  Pain Level Score  Description  No Pain 0   Mild pain 1 Nagging, annoying, but does not interfere with basic activities of daily living (ADL). Patients are able to eat, bathe, get dressed, toileting (being able to get on and off the toilet and perform personal hygiene functions), transfer (move in and out of bed or a chair without assistance), and maintain continence (able to control bladder and bowel functions). Blood pressure and heart rate are unaffected. A normal heart rate for a healthy adult ranges from  60 to 100 bpm (beats per minute).   Mild to moderate pain 2 Noticeable and distracting. Impossible to hide from other people. More frequent flare-ups. Still possible to adapt and function close to normal. It can be very annoying and may have occasional stronger flare-ups. With discipline,  patients may get used to it and adapt.   Moderate pain 3 Interferes significantly with activities of daily living (ADL). It becomes difficult to feed, bathe, get dressed, get on and off the toilet or to perform personal hygiene functions. Difficult to get in and out of bed or a chair without assistance. Very distracting. With effort, it can be ignored when deeply involved in activities.   Moderately severe pain 4 Impossible to ignore for more than a few minutes. With effort, patients may still be able to manage work or participate in some social activities. Very difficult to concentrate. Signs of autonomic nervous system discharge are evident: dilated pupils (mydriasis); mild sweating (diaphoresis); sleep interference. Heart rate becomes elevated (>115 bpm). Diastolic blood pressure (lower number) rises above 100 mmHg. Patients find relief in laying down and not moving.   Severe pain 5 Intense and extremely unpleasant. Associated with frowning face and frequent crying. Pain overwhelms the senses.  Ability to do any activity or maintain social relationships becomes significantly limited. Conversation becomes difficult. Pacing back and forth is common, as getting into a comfortable position is nearly impossible. Pain wakes you up from deep sleep. Physical signs will be obvious: pupillary dilation; increased sweating; goosebumps; brisk reflexes; cold, clammy hands and feet; nausea, vomiting or dry heaves; loss of appetite; significant sleep disturbance with inability to fall asleep or to remain asleep. When persistent, significant weight loss is observed due to the complete loss of appetite and sleep deprivation.  Blood  pressure and heart rate becomes significantly elevated. Caution: If elevated blood pressure triggers a pounding headache associated with blurred vision, then the patient should immediately seek attention at an urgent or emergency care unit, as these may be signs of an impending stroke.    Emergency Department Pain Levels (6-10/10)  Emergency Room Pain 6 Severely limiting. Requires emergency care and should not be seen or managed at an outpatient pain management facility. Communication becomes difficult and requires great effort. Assistance to reach the emergency department may be required. Facial flushing and profuse sweating along with potentially dangerous increases in heart rate and blood pressure will be evident.   Distressing pain 7 Self-care is very difficult. Assistance is required to transport, or use restroom. Assistance to reach the emergency department will be required. Tasks requiring coordination, such as bathing and getting dressed become very difficult.   Disabling pain 8 Self-care is no longer possible. At this level, pain is disabling. The individual is unable to do even the most "basic" activities such as walking, eating, bathing, dressing, transferring to a bed, or toileting. Fine motor skills are lost. It is difficult to think clearly.   Incapacitating pain 9 Pain becomes incapacitating. Thought processing is no longer possible. Difficult to remember your own name. Control of movement and coordination are lost.   The worst pain imaginable 10 At this level, most patients pass out from pain. When this level is reached, collapse of the autonomic nervous system occurs, leading to a sudden drop in blood pressure and heart rate. This in turn results in a temporary and dramatic drop in blood flow to the brain, leading to a loss of consciousness. Fainting is one of the body's self defense mechanisms. Passing out puts the brain in a calmed state and causes it to shut down for a while, in  order to begin the healing process.    Summary: 1. Refer to this scale when providing [place] with your pain level. 2. Be accurate and careful when reporting  your pain level. This will help with your care. 3. Over-reporting your pain level will lead to loss of credibility. 4. Even a level of 1/10 means that there is pain and will be treated at our facility. 5. High, inaccurate reporting will be documented as "Symptom Exaggeration", leading to loss of credibility and suspicions of possible secondary gains such as obtaining more narcotics, or wanting to appear disabled, for fraudulent reasons. 6. Only pain levels of 5 or below will be seen at our facility. 7. Pain levels of 6 and above will be sent to the Emergency Department and the appointment cancelled. ____________________________________________________________________________________________   BMI Assessment: Estimated body mass index is 40.44 kg/m as calculated from the following:   Height as of this encounter: '5\' 5"'  (1.651 m).   Weight as of this encounter: 243 lb (110.2 kg).  BMI interpretation table: BMI level Category Range association with higher incidence of chronic pain  <18 kg/m2 Underweight   18.5-24.9 kg/m2 Ideal body weight   25-29.9 kg/m2 Overweight Increased incidence by 20%  30-34.9 kg/m2 Obese (Class I) Increased incidence by 68%  35-39.9 kg/m2 Severe obesity (Class II) Increased incidence by 136%  >40 kg/m2 Extreme obesity (Class III) Increased incidence by 254%   Patient's current BMI Ideal Body weight  Body mass index is 40.44 kg/m. Ideal body weight: 57 kg (125 lb 10.6 oz) Adjusted ideal body weight: 78.3 kg (172 lb 9.6 oz)   BMI Readings from Last 4 Encounters:  01/21/18 40.44 kg/m  12/16/17 40.44 kg/m  10/22/17 39.94 kg/m  09/30/17 40.44 kg/m   Wt Readings from Last 4 Encounters:  01/21/18 243 lb (110.2 kg)  12/16/17 243 lb (110.2 kg)  10/22/17 240 lb (108.9 kg)  09/30/17 243 lb (110.2 kg)

## 2018-02-12 ENCOUNTER — Ambulatory Visit: Payer: Medicaid Other | Admitting: Pain Medicine

## 2018-02-12 ENCOUNTER — Encounter: Payer: Self-pay | Admitting: Pain Medicine

## 2018-02-12 ENCOUNTER — Other Ambulatory Visit: Payer: Self-pay

## 2018-02-12 ENCOUNTER — Ambulatory Visit
Admission: RE | Admit: 2018-02-12 | Discharge: 2018-02-12 | Disposition: A | Discharge status: Home / Self Care | Payer: Medicaid Other | Source: Ambulatory Visit | Attending: Pain Medicine | Admitting: Pain Medicine

## 2018-02-12 VITALS — BP 123/54 | HR 79 | Temp 98.7°F | Resp 18 | Ht 65.0 in | Wt 240.0 lb

## 2018-02-12 DIAGNOSIS — Z885 Allergy status to narcotic agent status: Secondary | ICD-10-CM | POA: Insufficient documentation

## 2018-02-12 DIAGNOSIS — Z79891 Long term (current) use of opiate analgesic: Secondary | ICD-10-CM | POA: Diagnosis not present

## 2018-02-12 DIAGNOSIS — M47816 Spondylosis without myelopathy or radiculopathy, lumbar region: Secondary | ICD-10-CM

## 2018-02-12 DIAGNOSIS — Z79890 Hormone replacement therapy (postmenopausal): Secondary | ICD-10-CM | POA: Insufficient documentation

## 2018-02-12 DIAGNOSIS — Z888 Allergy status to other drugs, medicaments and biological substances status: Secondary | ICD-10-CM | POA: Insufficient documentation

## 2018-02-12 DIAGNOSIS — M47817 Spondylosis without myelopathy or radiculopathy, lumbosacral region: Secondary | ICD-10-CM | POA: Insufficient documentation

## 2018-02-12 DIAGNOSIS — Z91041 Radiographic dye allergy status: Secondary | ICD-10-CM | POA: Diagnosis not present

## 2018-02-12 DIAGNOSIS — Z886 Allergy status to analgesic agent status: Secondary | ICD-10-CM | POA: Diagnosis not present

## 2018-02-12 DIAGNOSIS — Z96612 Presence of left artificial shoulder joint: Secondary | ICD-10-CM | POA: Insufficient documentation

## 2018-02-12 DIAGNOSIS — M5441 Lumbago with sciatica, right side: Secondary | ICD-10-CM

## 2018-02-12 DIAGNOSIS — M545 Low back pain: Secondary | ICD-10-CM | POA: Insufficient documentation

## 2018-02-12 DIAGNOSIS — G8918 Other acute postprocedural pain: Secondary | ICD-10-CM

## 2018-02-12 DIAGNOSIS — Z79899 Other long term (current) drug therapy: Secondary | ICD-10-CM | POA: Diagnosis not present

## 2018-02-12 DIAGNOSIS — G8929 Other chronic pain: Secondary | ICD-10-CM | POA: Insufficient documentation

## 2018-02-12 DIAGNOSIS — F419 Anxiety disorder, unspecified: Secondary | ICD-10-CM | POA: Diagnosis not present

## 2018-02-12 DIAGNOSIS — Z881 Allergy status to other antibiotic agents status: Secondary | ICD-10-CM | POA: Diagnosis not present

## 2018-02-12 DIAGNOSIS — Z96611 Presence of right artificial shoulder joint: Secondary | ICD-10-CM | POA: Diagnosis not present

## 2018-02-12 MED ORDER — MIDAZOLAM HCL 5 MG/5ML IJ SOLN
1.0000 mg | INTRAMUSCULAR | Status: DC | PRN
  Administered 2018-02-12: 2 mg via INTRAVENOUS
  Filled 2018-02-12: qty 5

## 2018-02-12 MED ORDER — LIDOCAINE HCL 2 % IJ SOLN
20.0000 mL | Freq: Once | INTRAMUSCULAR | Status: AC
  Administered 2018-02-12: 400 mg
  Filled 2018-02-12: qty 40

## 2018-02-12 MED ORDER — HYDROCODONE-ACETAMINOPHEN 5-325 MG PO TABS: 1.0000 | ORAL_TABLET | Freq: Four times a day (QID) | ORAL | 0 refills | Status: DC | PRN

## 2018-02-12 MED ORDER — ROPIVACAINE HCL 2 MG/ML IJ SOLN
9.0000 mL | Freq: Once | INTRAMUSCULAR | Status: AC
  Administered 2018-02-12: 10 mL via PERINEURAL
  Filled 2018-02-12: qty 10

## 2018-02-12 MED ORDER — FENTANYL CITRATE (PF) 100 MCG/2ML IJ SOLN
25.0000 ug | INTRAMUSCULAR | Status: DC | PRN
  Filled 2018-02-12: qty 2

## 2018-02-12 MED ORDER — LACTATED RINGERS IV SOLN
1000.0000 mL | Freq: Once | INTRAVENOUS | Status: AC
  Administered 2018-02-12: 1000 mL via INTRAVENOUS

## 2018-02-12 NOTE — Progress Notes (Signed)
Safety precautions to be maintained throughout the outpatient stay will include: orient to surroundings, keep bed in low position, maintain call bell within reach at all times, provide assistance with transfer out of bed and ambulation.  

## 2018-02-12 NOTE — Patient Instructions (Signed)

## 2018-02-12 NOTE — Progress Notes (Signed)
Patient's Name: Danielle [name]  MRN: [id number]  Referring Provider: Marygrace Drought, MD  DOB: 06-22-1961  PCP: Marygrace Drought, MD  DOS: 02/12/2018  Note by: Gaspar Cola, MD  Service setting: Ambulatory outpatient  Specialty: Interventional Pain Management  Patient type: Established  Location: [facility] (AMB) Pain Management Facility  Visit type: Interventional Procedure   Primary Reason for Visit: Interventional Pain Management Treatment. CC: Back Pain (low)  Procedure:          Anesthesia, Analgesia, Anxiolysis:  Type: Thermal Lumbar Facet, Medial Branch Radiofrequency Ablation/Neurotomy Level: L2, L3, L4, L5, & S1 Medial Branch Level(s). These levels will denervate the L3-4, L4-5, and the L5-S1 lumbar facet joints. Primary Purpose: Therapeutic Region: Posterolateral Lumbosacral Spine Laterality: Left  Type: Moderate (Conscious) Sedation combined with Local Anesthesia Indication(s): Analgesia and Anxiety Route: Intravenous (IV) IV Access: Secured Sedation: Meaningful verbal contact was maintained at all times during the procedure  Local Anesthetic: Lidocaine 1-2%   Indications: 1. Spondylosis without myelopathy or radiculopathy, lumbosacral region   2. 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 Chronic low back pain [facility] source of pain) (Bilateral) (R>L)    Danielle [name] has been dealing with the above chronic pain for longer than three months and has either failed to respond, was unable to tolerate, or simply did not get enough benefit from other more conservative therapies including, but not limited to: 1. Over-the-counter medications 2. Anti-inflammatory medications 3. Muscle relaxants 4. Membrane stabilizers 5. Opioids 6. Physical therapy 7. Modalities (Heat, ice, etc.) 8. Invasive techniques such as nerve blocks. Danielle [name] has attained more than 50% relief of the pain from a series of diagnostic injections conducted in separate occasions.  Pain  Score: Pre-procedure: 6 /10 Post-procedure: 1 /10  Pre-op Assessment:  Danielle [name] is a 57 y.o. (year old), female patient, seen today for interventional treatment. She  has a past surgical history that includes necksurgery (10/21/2014); Back surgery (2013); Foot surgery (Right); Elbow surgery (Left); Carpal tunnel release (Right); Nose surgery; Partial hysterectomy; Total shoulder replacement (Bilateral); and Colonoscopy with propofol (N/A, 02/05/2017). Danielle [name] has a current medication list which includes the following prescription(s): albuterol, cetirizine, cholecalciferol, citalopram, diclofenac sodium, docusate sodium, levothyroxine, magnesium hydroxide, naloxone, nystatin, oxycodone hcl, oxycodone hcl, oxycodone hcl, spiriva handihaler, tizanidine, citalopram, hydrocodone-acetaminophen, and hydrocodone-acetaminophen, and the following Facility-Administered Medications: fentanyl and midazolam. Her primarily concern today is the Back Pain (low)  Initial Vital Signs:  Pulse/HCG Rate: 79ECG Heart Rate: 74 Temp: 98.7 F (37.1 C) Resp: 20 BP: 110/81 SpO2: 97 %  BMI: Estimated body mass index is 39.94 kg/m as calculated from the following:   Height as of this encounter: 5\' 5"  (1.651 m).   Weight as of this encounter: 240 lb (108.9 kg).  Risk Assessment: Allergies: Reviewed. She is allergic to aripiprazole; duloxetine; gabapentin; gold; iodinated diagnostic agents; naproxen sodium; nortriptyline hcl; nsaids; pregabalin; meperidine; ace inhibitors; aspirin; cefpodoxime; fluoxetine; fluticasone-salmeterol; levofloxacin; lithium; paroxetine; tape; telithromycin; theophylline; topiramate; trazodone; triamcinolone; venlafaxine; bupropion; ketorolac tromethamine; morphine; and moxifloxacin.  Allergy Precautions: No iodine containing solutions or radiological contrast used. In addition, sedation was given with Versed only, no fentanyl. Coagulopathies: Reviewed. None identified.  Blood-thinner  therapy: None at this time Active Infection(s): Reviewed. None identified. Danielle [name] is afebrile  Site Confirmation: Danielle [name] was asked to confirm the procedure and laterality before marking the site Procedure checklist: Completed Consent: Before the procedure and under the influence of no sedative(s), amnesic(s), or anxiolytics, the patient was informed of the treatment options,  risks and possible complications. To fulfill our ethical and legal obligations, as recommended by the American Medical Association's Code of Ethics, I have informed the patient of my clinical impression; the nature and purpose of the treatment or procedure; the risks, benefits, and possible complications of the intervention; the alternatives, including doing nothing; the risk(s) and benefit(s) of the alternative treatment(s) or procedure(s); and the risk(s) and benefit(s) of doing nothing. The patient was provided information about the general risks and possible complications associated with the procedure. These may include, but are not limited to: failure to achieve desired goals, infection, bleeding, organ or nerve damage, allergic reactions, paralysis, and death. In addition, the patient was informed of those risks and complications associated to Spine-related procedures, such as failure to decrease pain; infection (i.e.: Meningitis, epidural or intraspinal abscess); bleeding (i.e.: epidural hematoma, subarachnoid hemorrhage, or any other type of intraspinal or peri-dural bleeding); organ or nerve damage (i.e.: Any type of peripheral nerve, nerve root, or spinal cord injury) with subsequent damage to sensory, motor, and/or autonomic systems, resulting in permanent pain, numbness, and/or weakness of one or several areas of the body; allergic reactions; (i.e.: anaphylactic reaction); and/or death. Furthermore, the patient was informed of those risks and complications associated with the medications. These include, but are not  limited to: allergic reactions (i.e.: anaphylactic or anaphylactoid reaction(s)); adrenal axis suppression; blood sugar elevation that in diabetics may result in ketoacidosis or comma; water retention that in patients with history of congestive heart failure may result in shortness of breath, pulmonary edema, and decompensation with resultant heart failure; weight gain; swelling or edema; medication-induced neural toxicity; particulate matter embolism and blood vessel occlusion with resultant organ, and/or nervous system infarction; and/or aseptic necrosis of one or more joints. Finally, the patient was informed that Medicine is not an exact science; therefore, there is also the possibility of unforeseen or unpredictable risks and/or possible complications that may result in a catastrophic outcome. The patient indicated having understood very clearly. We have given the patient no guarantees and we have made no promises. Enough time was given to the patient to ask questions, all of which were answered to the patient's satisfaction. Ms. Vanderford has indicated that she wanted to continue with the procedure. Attestation: I, the ordering provider, attest that I have discussed with the patient the benefits, risks, side-effects, alternatives, likelihood of achieving goals, and potential problems during recovery for the procedure that I have provided informed consent. Date  Time: 02/12/2018 10:33 AM  Pre-Procedure Preparation:  Monitoring: As per clinic protocol. Respiration, ETCO2, SpO2, BP, heart rate and rhythm monitor placed and checked for adequate function Safety Precautions: Patient was assessed for positional comfort and pressure points before starting the procedure. Time-out: I initiated and conducted the "Time-out" before starting the procedure, as per protocol. The patient was asked to participate by confirming the accuracy of the "Time Out" information. Verification of the correct person, site, and  procedure were performed and confirmed by me, the nursing staff, and the patient. "Time-out" conducted as per Joint Commission's Universal Protocol (UP.01.01.01). Time:    Description of Procedure:          Position: Prone Laterality: Left Levels:  L2, L3, L4, L5, & S1 Medial Branch Level(s), at the L3-4, L4-5, and the L5-S1 lumbar facet joints. Area Prepped: Lumbosacral Prepping solution: ChloraPrep (2% chlorhexidine gluconate and 70% isopropyl alcohol) Safety Precautions: Aspiration looking for blood return was conducted prior to all injections. At no point did we inject any substances, as  a needle was being advanced. Before injecting, the patient was told to immediately notify me if she was experiencing any new onset of "ringing in the ears, or metallic taste in the mouth". No attempts were made at seeking any paresthesias. Safe injection practices and needle disposal techniques used. Medications properly checked for expiration dates. SDV (single dose vial) medications used. After the completion of the procedure, all disposable equipment used was discarded in the proper designated medical waste containers. Local Anesthesia: Protocol guidelines were followed. The patient was positioned over the fluoroscopy table. The area was prepped in the usual manner. The time-out was completed. The target area was identified using fluoroscopy. A 12-in long, straight, sterile hemostat was used with fluoroscopic guidance to locate the targets for each level blocked. Once located, the skin was marked with an approved surgical skin marker. Once all sites were marked, the skin (epidermis, dermis, and hypodermis), as well as deeper tissues (fat, connective tissue and muscle) were infiltrated with a small amount of a short-acting local anesthetic, loaded on a 10cc syringe with a 25G, 1.5-in  Needle. An appropriate amount of time was allowed for local anesthetics to take effect before proceeding to the next step. Local  Anesthetic: Lidocaine 2.0% The unused portion of the local anesthetic was discarded in the proper designated containers. Technical explanation of process:  Radiofrequency Ablation (RFA) L2 Medial Branch Nerve RFA: The target area for the L2 medial branch is at the junction of the postero-lateral aspect of the superior articular process and the superior, posterior, and medial edge of the transverse process of L3. Under fluoroscopic guidance, a Radiofrequency needle was inserted until contact was made with os over the superior postero-lateral aspect of the pedicular shadow (target area). Sensory and motor testing was conducted to properly adjust the position of the needle. Once satisfactory placement of the needle was achieved, the numbing solution was slowly injected after negative aspiration for blood. 2.0 mL of the nerve block solution was injected without difficulty or complication. After waiting for at least 3 minutes, the ablation was performed. Once completed, the needle was removed intact. L3 Medial Branch Nerve RFA: The target area for the L3 medial branch is at the junction of the postero-lateral aspect of the superior articular process and the superior, posterior, and medial edge of the transverse process of L4. Under fluoroscopic guidance, a Radiofrequency needle was inserted until contact was made with os over the superior postero-lateral aspect of the pedicular shadow (target area). Sensory and motor testing was conducted to properly adjust the position of the needle. Once satisfactory placement of the needle was achieved, the numbing solution was slowly injected after negative aspiration for blood. 2.0 mL of the nerve block solution was injected without difficulty or complication. After waiting for at least 3 minutes, the ablation was performed. Once completed, the needle was removed intact. L4 Medial Branch Nerve RFA: The target area for the L4 medial branch is at the junction of the  postero-lateral aspect of the superior articular process and the superior, posterior, and medial edge of the transverse process of L5. Under fluoroscopic guidance, a Radiofrequency needle was inserted until contact was made with os over the superior postero-lateral aspect of the pedicular shadow (target area). Sensory and motor testing was conducted to properly adjust the position of the needle. Once satisfactory placement of the needle was achieved, the numbing solution was slowly injected after negative aspiration for blood. 2.0 mL of the nerve block solution was injected without difficulty or complication.  After waiting for at least 3 minutes, the ablation was performed. Once completed, the needle was removed intact. L5 Medial Branch Nerve RFA: The target area for the L5 medial branch is at the junction of the postero-lateral aspect of the superior articular process of S1 and the superior, posterior, and medial edge of the sacral ala. Under fluoroscopic guidance, a Radiofrequency needle was inserted until contact was made with os over the superior postero-lateral aspect of the pedicular shadow (target area). Sensory and motor testing was conducted to properly adjust the position of the needle. Once satisfactory placement of the needle was achieved, the numbing solution was slowly injected after negative aspiration for blood. 2.0 mL of the nerve block solution was injected without difficulty or complication. After waiting for at least 3 minutes, the ablation was performed. Once completed, the needle was removed intact. S1 Medial Branch Nerve RFA: The target area for the S1 medial branch is located inferior to the junction of the S1 superior articular process and the L5 inferior articular process, posterior, inferior, and lateral to the 6 o'clock position of the L5-S1 facet joint, just superior to the S1 posterior foramen. Under fluoroscopic guidance, the Radiofrequency needle was advanced until contact was made  with os over the Target area. Sensory and motor testing was conducted to properly adjust the position of the needle. Once satisfactory placement of the needle was achieved, the numbing solution was slowly injected after negative aspiration for blood. 2.0 mL of the nerve block solution was injected without difficulty or complication. After waiting for at least 3 minutes, the ablation was performed. Once completed, the needle was removed intact. Radiofrequency lesioning (ablation):  Radiofrequency Generator: NeuroTherm [id number] Sensory Stimulation Parameters: 50 Hz was used to locate & identify the nerve, making sure that the needle was positioned such that there was no sensory stimulation below 0.3 V or above 0.7 V. Motor Stimulation Parameters: 2 Hz was used to evaluate the motor component. Care was taken not to lesion any nerves that demonstrated motor stimulation of the lower extremities at an output of less than 2.5 times that of the sensory threshold, or a maximum of 2.0 V. Lesioning Technique Parameters: Standard Radiofrequency settings. (Not bipolar or pulsed.) Temperature Settings: 80 degrees C Lesioning time: 60 seconds Intra-operative Compliance: Compliant Materials & Medications: Needle(s) (Electrode/Cannula) Type: Teflon-coated, curved tip, Radiofrequency needle(s) Gauge: 22G Length: 10cm Numbing solution: 0.2% PF-Ropivacaine + Triamcinolone (40 mg/mL) diluted to a final concentration of 4 mg of Triamcinolone/mL of Ropivacaine The unused portion of the solution was discarded in the proper designated containers.  Once the entire procedure was completed, the treated area was cleaned, making sure to leave some of the prepping solution back to take advantage of its long term bactericidal properties.  Illustration of the posterior view of the lumbar spine and the posterior neural structures. Laminae of L2 through S1 are labeled. DPRL5, dorsal primary ramus of L5; DPRS1, dorsal primary ramus of  S1; DPR3, dorsal primary ramus of L3; FJ, facet (zygapophyseal) joint L3-L4; I, inferior articular process of L4; LB1, lateral branch of dorsal primary ramus of L1; IAB, inferior articular branches from L3 medial branch (supplies L4-L5 facet joint); IBP, intermediate branch plexus; MB3, medial branch of dorsal primary ramus of L3; NR3, third lumbar nerve root; S, superior articular process of L5; SAB, superior articular branches from L4 (supplies L4-5 facet joint also); TP3, transverse process of L3.  Vitals:   02/12/18 1200 02/12/18 1210 02/12/18 1219 02/12/18 1229  BP: 122/87 126/63  121/65 (!) 123/54  Pulse:      Resp: 16 13 16 18   Temp:      SpO2: 95% 100% 98% 97%  Weight:      Height:        Start Time:   hrs. End Time: 1158 hrs.  Imaging Guidance (Spinal):          Type of Imaging Technique: Fluoroscopy Guidance (Spinal) Indication(s): Assistance in needle guidance and placement for procedures requiring needle placement in or near specific anatomical locations not easily accessible without such assistance. Exposure Time: Please see nurses notes. Contrast: None used. Fluoroscopic Guidance: I was personally present during the use of fluoroscopy. "Tunnel Vision Technique" used to obtain the best possible view of the target area. Parallax error corrected before commencing the procedure. "Direction-depth-direction" technique used to introduce the needle under continuous pulsed fluoroscopy. Once target was reached, antero-posterior, oblique, and lateral fluoroscopic projection used confirm needle placement in all planes. Images permanently stored in EMR. Interpretation: No contrast injected. I personally interpreted the imaging intraoperatively. Adequate needle placement confirmed in multiple planes. Permanent images saved into the patient's record.  Antibiotic Prophylaxis:   Anti-infectives (From admission, onward)   None     Indication(s): None identified  Post-operative Assessment:   Post-procedure Vital Signs:  Pulse/HCG Rate: 7984 Temp: 98.7 F (37.1 C) Resp: 18 BP: (!) 123/54 SpO2: 97 %  EBL: None  Complications: No immediate post-treatment complications observed by team, or reported by patient.  Note: The patient tolerated the entire procedure well. A repeat set of vitals were taken after the procedure and the patient was kept under observation following institutional policy, for this type of procedure. Post-procedural neurological assessment was performed, showing return to baseline, prior to discharge. The patient was provided with post-procedure discharge instructions, including a section on how to identify potential problems. Should any problems arise concerning this procedure, the patient was given instructions to immediately contact us, at any time, without hesitation. In any case, we plan to contact the patient by telephone for a follow-up status report regarding this interventional procedure.  Comments:  No additional relevant information.  Plan of Care    Imaging Orders     DG C-Arm 1-60 Min-No Report  Procedure Orders     Radiofrequency,Lumbar  Medications ordered for procedure: Meds ordered this encounter  Medications  . lidocaine (XYLOCAINE) 2 % (with pres) injection 400 mg  . midazolam (VERSED) 5 MG/5ML injection 1-2 mg    Make sure Flumazenil is available in the pyxis when using this medication. If oversedation occurs, administer 0.2 mg IV over 15 sec. If after 45 sec no response, administer 0.2 mg again over 1 min; may repeat at 1 min intervals; not to exceed 4 doses (1 mg)  . fentaNYL (SUBLIMAZE) injection 25-50 mcg    Make sure Narcan is available in the pyxis when using this medication. In the event of respiratory depression (RR< 8/min): Titrate NARCAN (naloxone) in increments of 0.1 to 0.2 mg IV at 2-3 minute intervals, until desired degree of reversal.  . lactated ringers infusion 1,000 mL  . ropivacaine (PF) 2 mg/mL (0.2%) (NAROPIN)  injection 9 mL  . HYDROcodone-acetaminophen (NORCO/VICODIN) 5-325 MG tablet    Sig: Take 1 tablet by mouth every 6 (six) hours as needed for up to 7 days for severe pain.    Dispense:  28 tablet    Refill:  0    For acute post-operative pain. Not to be refilled. To last 7 days.  Marland Kitchen  HYDROcodone-acetaminophen (NORCO/VICODIN) 5-325 MG tablet    Sig: Take 1 tablet by mouth every 6 (six) hours as needed for up to 7 days for severe pain.    Dispense:  28 tablet    Refill:  0    For acute post-operative pain. Not to be refilled. To last 7 days.   Medications administered: We administered lidocaine, midazolam, lactated ringers, and ropivacaine (PF) 2 mg/mL (0.2%).  See the medical record for exact dosing, route, and time of administration.  New Prescriptions   HYDROCODONE-ACETAMINOPHEN (NORCO/VICODIN) 5-325 MG TABLET    Take 1 tablet by mouth every 6 (six) hours as needed for up to 7 days for severe pain.   Disposition: Discharge home  Discharge Date & Time: 02/12/2018; 1230 hrs.   Physician-requested Follow-up: Return for Post-RFA eval (6 wks), w/ Dionisio David, NP.  Future Appointments  Date Time Provider [place]  03/26/2018 11:30 AM Vevelyn Francois, NP [facility] None  04/21/2018  1:30 PM Vevelyn Francois, NP [facility] None   Primary Care Physician: Marygrace Drought, MD Location: [facility] Note by: Gaspar Cola, MD Date: 02/12/2018; Time: 12:33 PM  Disclaimer:  Medicine is not an exact science. The only guarantee in medicine is that nothing is guaranteed. It is important to note that the decision to proceed with this intervention was based on the information collected from the patient. The Data and conclusions were drawn from the patient's questionnaire, the interview, and the physical examination. Because the information was provided in large part by the patient, it cannot be guaranteed that it has not been purposely or unconsciously manipulated.  Every effort has been made to obtain as much relevant data as possible for this evaluation. It is important to note that the conclusions that lead to this procedure are derived in large part from the available data. Always take into account that the treatment will also be dependent on availability of resources and existing treatment guidelines, considered by other Pain Management Practitioners as being common knowledge and practice, at the time of the intervention. For Medico-Legal purposes, it is also important to point out that variation in procedural techniques and pharmacological choices are the acceptable norm. The indications, contraindications, technique, and results of the above procedure should only be interpreted and judged by a Board-Certified Interventional Pain Specialist with extensive familiarity and expertise in the same exact procedure and technique.

## 2018-02-13 ENCOUNTER — Telehealth: Payer: Self-pay

## 2018-02-13 NOTE — Telephone Encounter (Signed)
Post procedure phone call.  LM with family to have patient call us if needed.

## 2018-03-24 ENCOUNTER — Telehealth: Payer: Self-pay | Admitting: Nurse Practitioner

## 2018-03-24 NOTE — Telephone Encounter (Signed)
Having problems getting meds filled, she is being told she needs prior auth again. She is out of meds

## 2018-03-24 NOTE — Telephone Encounter (Signed)
Called patient to see what medicine she needed PA for.  Patient states that she has cellulitis on right leg.  Patient needs PA for oxycodone hcl 10 mg.  I will send via [employer].

## 2018-03-25 ENCOUNTER — Encounter (INDEPENDENT_AMBULATORY_CARE_PROVIDER_SITE_OTHER): Payer: Self-pay | Admitting: Nurse Practitioner

## 2018-03-25 ENCOUNTER — Ambulatory Visit (INDEPENDENT_AMBULATORY_CARE_PROVIDER_SITE_OTHER): Payer: Medicaid Other | Admitting: Nurse Practitioner

## 2018-03-25 VITALS — BP 130/89 | HR 96 | Resp 17 | Ht 65.0 in | Wt 244.6 lb

## 2018-03-25 DIAGNOSIS — L03115 Cellulitis of right lower limb: Secondary | ICD-10-CM | POA: Diagnosis not present

## 2018-03-25 DIAGNOSIS — R600 Localized edema: Secondary | ICD-10-CM | POA: Diagnosis not present

## 2018-03-25 DIAGNOSIS — I10 Essential (primary) hypertension: Secondary | ICD-10-CM

## 2018-03-25 DIAGNOSIS — J418 Mixed simple and mucopurulent chronic bronchitis: Secondary | ICD-10-CM | POA: Diagnosis not present

## 2018-03-25 NOTE — Progress Notes (Signed)
Subjective:    Patient ID: Danielle [name], female    DOB: 1961-05-04, 57 y.o.   MRN: [id number] Chief Complaint  Patient presents with  . New Patient (Initial Visit)    ref [facility] for right leg pain    HPI  Danielle [name] is a 57 y.o. female who presents as a referral from Dr. Guadelupe Sabin.  The patient had what is described as a swollen, very tender, angry red, area just below the medial portion of her right knee.  This area remained exquisitely tender and painful until she went to the emergency room where she was given a course of clindamycin for what was thought to be cellulitis.  This occurred on 03/22/2018.  On presentation today, the patient denies severe pain to the area.  She states that it is mildly painful.  It does not appear to be erythematous at all.  She states that since beginning the clindamycin the pain, redness, and swelling all decreased.  The patient states that she still continues to get some leg swelling.  She states that the swelling is bilateral, it is worse in the evening and better in the morning following elevation.  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  She denies any chest pain or shortness of breath.  She denies any fever, chills, nausea, diarrhea.  She denies any claudication-like symptoms.  She denies any history of DVT.   Constitutional: [] Weight loss  [] Fever  [] Chills Cardiac: [] Chest pain   [] Chest pressure   [] Palpitations   [] Shortness of breath when laying flat   [] Shortness of breath with exertion. Vascular:  [] Pain in legs with walking   [x] Pain in legs with standing  [] History of DVT   [] Phlebitis   [x] Swelling in legs   [] Varicose veins   [] Non-healing ulcers Pulmonary:   [] Uses home oxygen   [] Productive cough   [] Hemoptysis   [] Wheeze  [] COPD   [] Asthma Neurologic:  [] Dizziness   [] Seizures   [] History of stroke   [] History of TIA  [] Aphasia   [] Vissual changes   [] Weakness or numbness in arm    [] Weakness or numbness in leg Musculoskeletal:   [] Joint swelling   [] Joint pain   [] Low back pain Hematologic:  [] Easy bruising  [] Easy bleeding   [] Hypercoagulable state   [] Anemic Gastrointestinal:  [] Diarrhea   [] Vomiting  [] Gastroesophageal reflux/heartburn   [] Difficulty swallowing. Genitourinary:  [] Chronic kidney disease   [] Difficult urination  [] Frequent urination   [] Blood in urine Skin:  [] Rashes   [] Ulcers  Psychological:  [] History of anxiety   []  History of major depression.     Objective:   Physical Exam  BP 130/89 (BP Location: Right Arm)   Pulse 96   Resp 17   Ht 5\' 5"  (1.651 m)   Wt 244 lb 9.6 oz (110.9 kg)   BMI 40.70 kg/m   Past Medical History:  Diagnosis Date  . Anxiety   . Arthritis   . Asthma   . Bell's palsy   . Bursitis   . COPD (chronic obstructive pulmonary disease) ([place])   . Depression   . Fibromyalgia   . Heart murmur   . Hepatitis C   . Hiatal hernia   . Hyperlipidemia   . Hypertension   . IBS (irritable bowel syndrome)   . Insomnia   . Thyroid disease      Gen: WD/WN, NAD Head: [place]/AT, No temporalis wasting.  Ear/Nose/Throat: Hearing grossly intact, nares  w/o erythema or drainage Eyes: PER, EOMI, sclera nonicteric.  Neck: Supple, no masses.  No JVD.  Pulmonary:  Good air movement, no use of accessory muscles.  Cardiac: RRR Vascular:  Scattered varicosities bilaterally.  Negative Homans sign.  Area of redness and swelling has dissipated. Vessel Right Left  PT palpable palpable  Gastrointestinal: soft, non-distended. No guarding/no peritoneal signs.  Musculoskeletal: M/S 5/5 throughout.  No deformity or atrophy.  Neurologic: Pain and light touch intact in extremities.  Symmetrical.  Speech is fluent. Motor exam as listed above. Psychiatric: Judgment intact, Mood & affect appropriate for pt's clinical situation. Dermatologic: No Venous rashes. No Ulcers Noted.  No changes consistent with cellulitis. Lymph : No Cervical  lymphadenopathy, no lichenification or skin changes of chronic lymphedema.   Social History   Socioeconomic History  . Marital status: Divorced    Spouse name: Not on file  . Number of children: Not on file  . Years of education: Not on file  . Highest education level: Not on file  Occupational History  . Not on file  Social Needs  . Financial resource strain: Not hard at all  . Food insecurity:    Worry: Patient refused    Inability: Patient refused  . Transportation needs:    Medical: Patient refused    Non-medical: Patient refused  Tobacco Use  . Smoking status: Current Every Day Smoker    Packs/day: 1.00    Years: 40.00    Pack years: 40.00    Types: Cigarettes  . Smokeless tobacco: Never Used  Substance and Sexual Activity  . Alcohol use: No    Alcohol/week: 0.0 standard drinks    Comment: rarely  . Drug use: No  . Sexual activity: Not on file  Lifestyle  . Physical activity:    Days per week: Patient refused    Minutes per session: Patient refused  . Stress: Only a little  Relationships  . Social connections:    Talks on phone: Patient refused    Gets together: Patient refused    Attends religious service: Patient refused    Active member of club or organization: Patient refused    Attends meetings of clubs or organizations: Patient refused    Relationship status: Patient refused  . Intimate partner violence:    Fear of current or ex partner: Patient refused    Emotionally abused: Patient refused    Physically abused: Patient refused    Forced sexual activity: Patient refused  Other Topics Concern  . Not on file  Social History Narrative  . Not on file    Past Surgical History:  Procedure Laterality Date  . BACK SURGERY  2013   lumbar disk  . CARPAL TUNNEL RELEASE Right   . COLONOSCOPY WITH PROPOFOL N/A 02/05/2017   Procedure: COLONOSCOPY WITH PROPOFOL;  Surgeon: Robert Bellow, MD;  Location: [facility] ENDOSCOPY;  Service: Endoscopy;  Laterality:  N/A;  . ELBOW SURGERY Left   . FOOT SURGERY Right   . necksurgery  10/21/2014   spine neck fusion  . NOSE SURGERY    . PARTIAL HYSTERECTOMY    . TOTAL SHOULDER REPLACEMENT Bilateral     Family History  Problem Relation Age of Onset  . Gout Mother   . Cancer Mother   . Heart disease Father     Allergies  Allergen Reactions  . Aripiprazole Swelling  . Duloxetine Swelling    Other reaction(s): Other (See Comments) Clogged her up - constipation, feet swelling.  . Gabapentin  Swelling    Feet swell.  Girtha Rm Hives and Itching    Itching 45 minutes after taking it & then broke out in hives. Was put on Prednisone. Treated for 5 months.  . Iodinated Diagnostic Agents Anaphylaxis    Anaphylactic reaction first encounter with myelogram. Had myelogram, given IV dye, had no heart beat, no pulse.  . Naproxen Sodium     Other reaction(s): Other (See Comments) GI bleeding.  . Nortriptyline Hcl Swelling    Other reaction(s): Other (See Comments) Made her hoarse, throat swelling when she took 100 mg.  . Nsaids     Other reaction(s): Other (See Comments) GI bleed.  . Pregabalin Swelling    Feet swelling.  . Meperidine Hives    Meperidine is the generic name for Demerol  . Ace Inhibitors     Other reaction(s): Other (See Comments) Unknown  . Aspirin     Other reaction(s): Other (See Comments) Bothers her stomach.  . Cefpodoxime     Other reaction(s): Other (See Comments) Bad thrush.  . Fluoxetine Diarrhea    Constant runs.  . Fluticasone-Salmeterol Other (See Comments)    Blisters in mouth Other reaction(s): Other (See Comments) Blisters in mouth  . Levofloxacin     Other reaction(s): Other (See Comments) Unknown, probably stomach issue.  . Lithium     Other reaction(s): Other (See Comments) Made her stare at the walls & cry.  . Paroxetine     Other reaction(s): Other (See Comments) Unknown  . Tape     Other reaction(s): Other (See Comments) Skin peels off  .  Telithromycin     Other reaction(s): Other (See Comments) Blisters in mouth plus thrush.  . Theophylline     Other reaction(s): Other (See Comments) Unknown  . Topiramate Other (See Comments)    Vomiting and increased depression Other reaction(s): Other (See Comments) Vomiting and increased depression  . Trazodone     Other reaction(s): Other (See Comments) Blisters lips very bad.  . Triamcinolone     Other reaction(s): Other (See Comments) Mouth blistered down her throat even if she rinsed her mouth.  . Venlafaxine Diarrhea    Took for years & did fine. Dropped electrolytes because it caused a lot of diarrhea.  . Bupropion Rash  . Ketorolac Tromethamine Nausea And Vomiting    Other reaction(s): Other (See Comments) Puking & headache  . Morphine Nausea And Vomiting    Intermittent nausea & vomiting with pills, but could tolerate the liquid form.  . Moxifloxacin Nausea And Vomiting    Other reaction(s): Other (See Comments) Bad thrush.       Assessment & Plan:   1. Lower leg edema I have had a long discussion with the patient regarding swelling and why it  causes symptoms.  Patient will begin wearing graduated compression stockings class 1 (20-30 mmHg) on a daily basis a prescription was given. The patient will  beginning wearing the stockings first thing in the morning and removing them in the evening. The patient is instructed specifically not to sleep in the stockings.   In addition, behavioral modification will be initiated.  This will include frequent elevation, use of over the counter pain medications and exercise such as walking.  I have reviewed systemic causes for chronic edema such as liver, kidney and cardiac etiologies.  The patient denies problems with these organ systems.    Consideration for a lymph pump will also be made based upon the effectiveness of conservative therapy.  This  would help to improve the edema control and prevent sequela such as ulcers and  infections   Patient should undergo duplex ultrasound of the venous system to ensure that DVT or reflux is not present.  The patient will follow-up with me after the ultrasound.    - VAS [place] LOWER EXTREMITY VENOUS REFLUX; Future  2. Cellulitis of leg, right Cellulitis appears to be resolving.  It is likely that the cause of her swelling and pain and redness was truly due to cellulitis due to the fact that it resolved soon after antibiotics started.   We will obtain noninvasive studies to determine if the patient has lymphedema versus chronic venous insufficiency, which if uncontrolled could increase the likelihood of cellulitis   3. Hypertension, unspecified type Continue antihypertensive medications as already ordered, these medications have been reviewed and there are no changes at this time.   4. Mixed simple and mucopurulent chronic bronchitis (HCC) Continue pulmonary medications and aerosols as already ordered, these medications have been reviewed and there are no changes at this time.     Current Outpatient Medications on File Prior to Visit  Medication Sig Dispense Refill  . albuterol (PROVENTIL HFA;VENTOLIN HFA) 108 (90 Base) MCG/ACT inhaler Inhale 2 puffs into the lungs every 4 (four) hours as needed for wheezing or shortness of breath.     . cholecalciferol (VITAMIN D) 1000 units tablet Take 1,000 Units by mouth daily.    . citalopram (CELEXA) 20 MG tablet Take 20 mg by mouth daily.  3  . citalopram (CELEXA) 40 MG tablet Take 40 mg by mouth daily.  3  . clindamycin (CLEOCIN) 300 MG capsule Take by mouth.    . diclofenac sodium (VOLTAREN) 1 % GEL Apply 2 g topically 4 (four) times daily. 100 g 2  . docusate sodium (COLACE) 100 MG capsule Take 100 mg by mouth 2 (two) times daily.    . hydrochlorothiazide (HYDRODIURIL) 25 MG tablet Take by mouth.    . levothyroxine (SYNTHROID, LEVOTHROID) 50 MCG tablet TAKE 1 TABLET (50 MCG TOTAL) BY MOUTH DAILY AT 0600.  11  . magnesium  hydroxide (MILK OF MAGNESIA) 400 MG/5ML suspension Take by mouth.    . naloxone (NARCAN) 2 MG/2ML injection Inject content of syringe into thigh muscle. Call 911. 2 Syringe 1  . nystatin (MYCOSTATIN/NYSTOP) powder Apply topically.    . Oxycodone HCl 10 MG TABS Take 1 tablet (10 mg total) by mouth every 6 (six) hours as needed. 120 tablet 0  . SPIRIVA HANDIHALER 18 MCG inhalation capsule PLACE 1 CAPSULE (18 MCG TOTAL) INTO INHALER AND INHALE ONCE DAILY.  5  . tiZANidine (ZANAFLEX) 4 MG tablet Take 1 tablet (4 mg total) by mouth every 8 (eight) hours as needed for muscle spasms. 90 tablet 5  . cetirizine (ZYRTEC) 10 MG tablet Take 10 mg by mouth daily.     Marland Kitchen HYDROcodone-acetaminophen (NORCO/VICODIN) 5-325 MG tablet Take 1 tablet by mouth every 6 (six) hours as needed for up to 7 days for severe pain. 28 tablet 0  . HYDROcodone-acetaminophen (NORCO/VICODIN) 5-325 MG tablet Take 1 tablet by mouth every 6 (six) hours as needed for up to 7 days for severe pain. 28 tablet 0  . Oxycodone HCl 10 MG TABS Take 1 tablet (10 mg total) by mouth every 6 (six) hours as needed. 120 tablet 0  . Oxycodone HCl 10 MG TABS Take 1 tablet (10 mg total) by mouth every 6 (six) hours as needed. 120 tablet 0  No current facility-administered medications on file prior to visit.     There are no Patient Instructions on file for this visit. No follow-ups on file.   Kris Hartmann, NP

## 2018-03-26 ENCOUNTER — Ambulatory Visit: Payer: Medicaid Other | Admitting: Nurse Practitioner

## 2018-03-27 ENCOUNTER — Encounter (INDEPENDENT_AMBULATORY_CARE_PROVIDER_SITE_OTHER): Payer: Self-pay | Admitting: Nurse Practitioner

## 2018-04-08 DIAGNOSIS — J069 Acute upper respiratory infection, unspecified: Secondary | ICD-10-CM | POA: Insufficient documentation

## 2018-04-09 ENCOUNTER — Telehealth: Payer: Self-pay | Admitting: Nurse Practitioner

## 2018-04-09 NOTE — Telephone Encounter (Signed)
Patient has had to pay for last 2 scripts due to Prior Auth denial. Can someone contact Medicaid and find out exactly why they are denying and what we can do to fix this. She will bring the letter when she comes in for Appt. On Oct.

## 2018-04-21 ENCOUNTER — Ambulatory Visit: Payer: Medicaid Other | Attending: Nurse Practitioner | Admitting: Nurse Practitioner

## 2018-04-21 ENCOUNTER — Encounter: Payer: Self-pay | Admitting: Nurse Practitioner

## 2018-04-21 ENCOUNTER — Other Ambulatory Visit: Payer: Self-pay

## 2018-04-21 VITALS — BP 152/97 | HR 86 | Temp 98.2°F | Resp 20 | Ht 65.0 in | Wt 242.0 lb

## 2018-04-21 DIAGNOSIS — G4700 Insomnia, unspecified: Secondary | ICD-10-CM | POA: Insufficient documentation

## 2018-04-21 DIAGNOSIS — K625 Hemorrhage of anus and rectum: Secondary | ICD-10-CM | POA: Insufficient documentation

## 2018-04-21 DIAGNOSIS — M19011 Primary osteoarthritis, right shoulder: Secondary | ICD-10-CM | POA: Insufficient documentation

## 2018-04-21 DIAGNOSIS — K589 Irritable bowel syndrome without diarrhea: Secondary | ICD-10-CM | POA: Diagnosis not present

## 2018-04-21 DIAGNOSIS — G8929 Other chronic pain: Secondary | ICD-10-CM

## 2018-04-21 DIAGNOSIS — E785 Hyperlipidemia, unspecified: Secondary | ICD-10-CM | POA: Insufficient documentation

## 2018-04-21 DIAGNOSIS — Z886 Allergy status to analgesic agent status: Secondary | ICD-10-CM | POA: Insufficient documentation

## 2018-04-21 DIAGNOSIS — Z79899 Other long term (current) drug therapy: Secondary | ICD-10-CM | POA: Diagnosis not present

## 2018-04-21 DIAGNOSIS — M47812 Spondylosis without myelopathy or radiculopathy, cervical region: Secondary | ICD-10-CM | POA: Diagnosis not present

## 2018-04-21 DIAGNOSIS — I10 Essential (primary) hypertension: Secondary | ICD-10-CM | POA: Diagnosis not present

## 2018-04-21 DIAGNOSIS — Z6841 Body Mass Index (BMI) 40.0 and over, adult: Secondary | ICD-10-CM | POA: Diagnosis not present

## 2018-04-21 DIAGNOSIS — Z888 Allergy status to other drugs, medicaments and biological substances status: Secondary | ICD-10-CM | POA: Diagnosis not present

## 2018-04-21 DIAGNOSIS — R531 Weakness: Secondary | ICD-10-CM | POA: Insufficient documentation

## 2018-04-21 DIAGNOSIS — K449 Diaphragmatic hernia without obstruction or gangrene: Secondary | ICD-10-CM | POA: Insufficient documentation

## 2018-04-21 DIAGNOSIS — J069 Acute upper respiratory infection, unspecified: Secondary | ICD-10-CM | POA: Insufficient documentation

## 2018-04-21 DIAGNOSIS — F17210 Nicotine dependence, cigarettes, uncomplicated: Secondary | ICD-10-CM | POA: Diagnosis not present

## 2018-04-21 DIAGNOSIS — M797 Fibromyalgia: Secondary | ICD-10-CM | POA: Insufficient documentation

## 2018-04-21 DIAGNOSIS — M4802 Spinal stenosis, cervical region: Secondary | ICD-10-CM | POA: Insufficient documentation

## 2018-04-21 DIAGNOSIS — R55 Syncope and collapse: Secondary | ICD-10-CM | POA: Diagnosis not present

## 2018-04-21 DIAGNOSIS — M25551 Pain in right hip: Secondary | ICD-10-CM | POA: Diagnosis not present

## 2018-04-21 DIAGNOSIS — G894 Chronic pain syndrome: Secondary | ICD-10-CM | POA: Diagnosis present

## 2018-04-21 DIAGNOSIS — F419 Anxiety disorder, unspecified: Secondary | ICD-10-CM | POA: Insufficient documentation

## 2018-04-21 DIAGNOSIS — Z885 Allergy status to narcotic agent status: Secondary | ICD-10-CM | POA: Diagnosis not present

## 2018-04-21 DIAGNOSIS — M47816 Spondylosis without myelopathy or radiculopathy, lumbar region: Secondary | ICD-10-CM | POA: Insufficient documentation

## 2018-04-21 DIAGNOSIS — Z79891 Long term (current) use of opiate analgesic: Secondary | ICD-10-CM | POA: Diagnosis not present

## 2018-04-21 DIAGNOSIS — M25559 Pain in unspecified hip: Secondary | ICD-10-CM

## 2018-04-21 DIAGNOSIS — Z96611 Presence of right artificial shoulder joint: Secondary | ICD-10-CM | POA: Insufficient documentation

## 2018-04-21 DIAGNOSIS — J302 Other seasonal allergic rhinitis: Secondary | ICD-10-CM | POA: Diagnosis not present

## 2018-04-21 DIAGNOSIS — Z8249 Family history of ischemic heart disease and other diseases of the circulatory system: Secondary | ICD-10-CM | POA: Insufficient documentation

## 2018-04-21 DIAGNOSIS — F329 Major depressive disorder, single episode, unspecified: Secondary | ICD-10-CM | POA: Diagnosis not present

## 2018-04-21 DIAGNOSIS — J449 Chronic obstructive pulmonary disease, unspecified: Secondary | ICD-10-CM | POA: Diagnosis not present

## 2018-04-21 MED ORDER — OXYCODONE HCL 10 MG PO TABS
10.0000 mg | ORAL_TABLET | Freq: Four times a day (QID) | ORAL | 0 refills | Status: DC | PRN
Start: 2018-05-23 — End: 2018-07-23

## 2018-04-21 MED ORDER — OXYCODONE HCL 10 MG PO TABS
10.0000 mg | ORAL_TABLET | Freq: Four times a day (QID) | ORAL | 0 refills | Status: DC | PRN
Start: 2018-06-22 — End: 2018-07-23

## 2018-04-21 MED ORDER — KETOROLAC TROMETHAMINE 60 MG/2ML IM SOLN
INTRAMUSCULAR | Status: AC
  Filled 2018-04-21: qty 2

## 2018-04-21 MED ORDER — KETOROLAC TROMETHAMINE 60 MG/2ML IM SOLN: 60.0000 mg | Freq: Once | INTRAMUSCULAR | Status: DC

## 2018-04-21 MED ORDER — DICLOFENAC SODIUM 1 % TD GEL: 2.0000 g | Freq: Four times a day (QID) | TRANSDERMAL | 2 refills | Status: DC

## 2018-04-21 MED ORDER — OXYCODONE HCL 10 MG PO TABS: 10.0000 mg | ORAL_TABLET | Freq: Four times a day (QID) | ORAL | 0 refills | Status: DC | PRN

## 2018-04-21 MED ORDER — ORPHENADRINE CITRATE 30 MG/ML IJ SOLN
60.0000 mg | Freq: Once | INTRAMUSCULAR | Status: AC
  Administered 2018-04-21: 60 mg via INTRAMUSCULAR

## 2018-04-21 MED ORDER — ORPHENADRINE CITRATE 30 MG/ML IJ SOLN
INTRAMUSCULAR | Status: AC
  Filled 2018-04-21: qty 2

## 2018-04-21 NOTE — Progress Notes (Signed)
Nursing Pain Medication Assessment:  Safety precautions to be maintained throughout the outpatient stay will include: orient to surroundings, keep bed in low position, maintain call bell within reach at all times, provide assistance with transfer out of bed and ambulation.  Medication Inspection Compliance: Pill count conducted under aseptic conditions, in front of the patient. Neither the pills nor the bottle was removed from the patient's sight at any time. Once count was completed pills were immediately returned to the patient in their original bottle.  Medication: Oxycodone IR Pill/Patch Count: 6 of 120 pills remain Pill/Patch Appearance: Markings consistent with prescribed medication Bottle Appearance: Standard pharmacy container. Clearly labeled. Filled Date:9 / 10 / 2019 Last Medication intake:  Today

## 2018-04-21 NOTE — Progress Notes (Addendum)
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 (six) hours as needed.    Dispense:  120 tablet    Refill:  0    Do not add this medication to the electronic "Automatic Refill" notification system. Patient may have prescription filled one day early if pharmacy is closed on scheduled refill date.    Order Specific Question:   Supervising Provider    Answer:   Milinda Pointer [phone]  . Oxycodone HCl 10 MG TABS    Sig: Take 1 tablet (10 mg total) by mouth every 6 (six) hours as needed.    Dispense:  120 tablet    Refill:  0    Do not add this medication to the electronic "Automatic Refill" notification system. Patient may have prescription filled one day early if pharmacy is closed on scheduled refill date.    Order Specific Question:   Supervising Provider    Answer:   Milinda Pointer [phone]  . Oxycodone HCl 10 MG TABS    Sig: Take 1 tablet (10 mg total) by mouth every 6 (six) hours as needed.    Dispense:  120 tablet    Refill:  0    Do not  add this medication to the electronic "Automatic Refill" notification system. Patient may have prescription filled one day early if pharmacy is closed on scheduled refill date.    Order Specific Question:   Supervising Provider    Answer:   Milinda Pointer [phone]  . orphenadrine (NORFLEX) injection 60 mg  . DISCONTD: ketorolac (TORADOL) injection 60 mg   New Prescriptions   No medications on file   Medications administered today: We administered orphenadrine. Lab-work, procedure(s), and/or referral(s): Orders Placed This Encounter  Procedures  . ToxASSURE Select 13 (MW), Urine   Imaging and/or referral(s): None  Interventional therapies: Planned, scheduled, and/or pending:  Not at this time.   Considering:  Palliative bilateralsuprascapular nerve block Possible bilateral suprascapular nerve radiofrequencyablation Diagnostic bilateral intra-articular hip joint injection Possible bilateral hip joint radiofrequencyablation Diagnostic bilateral lumbar facet block Possible bilaterallumbar facet radiofrequencyablation Diagnostic caudal epiduralsteroid injection + diagnostic epidurogram Possible Racz procedure. Diagnostic bilateral cervical facet block Possible bilateral cervical facet radiofrequencyablation Diagnostic right-sided cervical epidural steroid injection Diagnostic bilateral sacroiliac joint block Possible bilateral sacroiliac joint RFA   Palliative PRN treatment(s):  Palliative bilateralsuprascapular nerve block Diagnostic bilateral intra-articular hip joint injection Diagnostic bilateral lumbar facet block Diagnostic caudal epiduralsteroid injection + diagnostic epidurogram Diagnostic bilateral cervical facet block Diagnostic  right-sided cervical epidural steroid injection Diagnostic bilateral sacroiliac joint block    Provider-requested follow-up: Return in about 3 months (around 07/22/2018) for MedMgmt.  Future Appointments  Date Time  Provider [place]  06/24/2018  3:00 PM [facility] None  06/24/2018  4:00 PM Kris Hartmann, NP [facility] None  07/23/2018 11:15 AM Danielle Francois, NP [facility] None   Primary Care Physician: Danielle Drought, MD Location: [name] Note by: Danielle Francois NP Date: 04/21/2018; Time: 3:47 PM  Pain Score Disclaimer: We use the NRS-11 scale. This is a self-reported, subjective measurement of pain severity with only modest accuracy. It is used primarily to identify changes within a particular patient. It must be understood that outpatient pain scales are significantly less accurate that those used for research, where they can be applied under ideal controlled circumstances with minimal exposure to variables. In reality, the score is likely to be a combination of pain intensity and pain affect, where pain affect describes the degree of emotional arousal or changes in action readiness caused by the sensory experience of pain. Factors such as social and work situation, setting, emotional state, anxiety levels, expectation, and prior pain experience may influence pain perception and show large inter-individual differences that may also be affected by time variables.  Patient instructions provided during this appointment: Patient Instructions   ____________________________________________________________________________________________  Medication Rules  Applies to: All patients receiving prescriptions (written or electronic).  Pharmacy of record: Pharmacy where electronic prescriptions will be sent. If written prescriptions are taken to a different pharmacy, please inform the nursing staff. The pharmacy listed in the electronic medical record should be the one where you would like electronic prescriptions to be sent.  Prescription refills: Only during scheduled appointments. Applies to both, written and electronic prescriptions.  NOTE: The following  applies primarily to controlled substances (Opioid* Pain Medications).   Patient's responsibilities: 1. Pain Pills: Bring all pain pills to every appointment (except for procedure appointments). 2. Pill Bottles: Bring pills in original pharmacy bottle. Always bring newest bottle. Bring bottle, even if empty. 3. Medication refills: You are responsible for knowing and keeping track of what medications you need refilled. The day before your appointment, write a list of all prescriptions that need to be refilled. Bring that list to your appointment and give it to the admitting nurse. Prescriptions will be written only during appointments. If you forget a medication, it will not be "Called in", "Faxed", or "electronically sent". You will need to get another appointment to get these prescribed. 4. Prescription Accuracy: You are responsible for carefully inspecting your prescriptions before leaving our office. Have the discharge nurse carefully go over each prescription with you, before taking them home. Make sure that your name is accurately spelled, that your address is correct. Check the name and dose of your medication to make sure it is accurate. Check the number of pills, and the written instructions to make sure they are clear and accurate. Make sure that you are given enough medication to last until your next medication refill appointment. 5. Taking Medication: Take medication as prescribed. Never take more pills than instructed. Never take medication more frequently than prescribed. Taking less pills or less frequently is permitted and encouraged, when it comes to controlled substances (written prescriptions).  6. Inform other Doctors: Always inform, all of your healthcare providers, of all the medications you take. 7. Pain Medication from other Providers: You are not allowed to accept any additional pain medication  from any other Doctor or Healthcare provider. There are two exceptions to this rule. (see  below) In the event that you require additional pain medication, you are responsible for notifying us, as stated below. 8. Medication Agreement: You are responsible for carefully reading and following our Medication Agreement. This must be signed before receiving any prescriptions from our practice. Safely store a copy of your signed Agreement. Violations to the Agreement will result in no further prescriptions. (Additional copies of our Medication Agreement are available upon request.) 9. Laws, Rules, & Regulations: All patients are expected to follow all Federal and [employer], [employer], Rules, [employer]. Ignorance of the Laws does not constitute a valid excuse. The use of any illegal substances is prohibited. 10. Adopted CDC guidelines & recommendations: Target dosing levels will be at or below 60 MME/day. Use of benzodiazepines** is not recommended.  Exceptions: There are only two exceptions to the rule of not receiving pain medications from other Healthcare Providers. 1. Exception #1 (Emergencies): In the event of an emergency (i.e.: accident requiring emergency care), you are allowed to receive additional pain medication. However, you are responsible for: As soon as you are able, call our office [phone], at any time of the day or night, and leave a message stating your name, the date and nature of the emergency, and the name and dose of the medication prescribed. In the event that your call is answered by a member of our staff, make sure to document and save the date, time, and the name of the person that took your information.  2. Exception #2 (Planned Surgery): In the event that you are scheduled by another doctor or dentist to have any type of surgery or procedure, you are allowed (for a period no longer than 30 days), to receive additional pain medication, for the acute post-op pain. However, in this case, you are responsible for picking up a copy of our "Post-op Pain Management for  Surgeons" handout, and giving it to your surgeon or dentist. This document is available at our office, and does not require an appointment to obtain it. Simply go to our office during business hours (Monday-Thursday from 8:00 AM to 4:00 PM) (Friday 8:00 AM to 12:00 Noon) or if you have a scheduled appointment with [place], prior to your surgery, and ask for it by name. In addition, you will need to provide [place] with your name, name of your surgeon, type of surgery, and date of procedure or surgery.  *Opioid medications include: morphine, codeine, oxycodone, oxymorphone, hydrocodone, hydromorphone, meperidine, tramadol, tapentadol, buprenorphine, fentanyl, methadone. **Benzodiazepine medications include: diazepam (Valium), alprazolam (Xanax), clonazepam (Klonopine), lorazepam (Ativan), clorazepate (Tranxene), chlordiazepoxide (Librium), estazolam (Prosom), oxazepam (Serax), temazepam (Restoril), triazolam (Halcion) (Last updated: 09/11/2017) ____________________________________________________________________________________________   BMI Assessment: Estimated body mass index is 40.27 kg/m as calculated from the following:   Height as of this encounter: '5\' 5"'  (1.651 m).   Weight as of this encounter: 242 lb (109.8 kg).  BMI interpretation table: BMI level Category Range association with higher incidence of chronic pain  <18 kg/m2 Underweight   18.5-24.9 kg/m2 Ideal body weight   25-29.9 kg/m2 Overweight Increased incidence by 20%  30-34.9 kg/m2 Obese (Class I) Increased incidence by 68%  35-39.9 kg/m2 Severe obesity (Class II) Increased incidence by 136%  >40 kg/m2 Extreme obesity (Class III) Increased incidence by 254%   Patient's current BMI Ideal Body weight  Body mass index is 40.27 kg/m. Ideal body weight: 57 kg (125 lb 10.6 oz) Adjusted ideal  body weight: 78.1 kg (172 lb 3.2 oz)   BMI Readings from Last 4 Encounters:  04/21/18 40.27 kg/m  03/25/18 40.70 kg/m  02/12/18 39.94 kg/m   01/21/18 40.44 kg/m   Wt Readings from Last 4 Encounters:  04/21/18 242 lb (109.8 kg)  03/25/18 244 lb 9.6 oz (110.9 kg)  02/12/18 240 lb (108.9 kg)  01/21/18 243 lb (110.2 kg)

## 2018-04-21 NOTE — Patient Instructions (Addendum)
____________________________________________________________________________________________  Medication Rules  Applies to: All patients receiving prescriptions (written or electronic).  Pharmacy of record: Pharmacy where electronic prescriptions will be sent. If written prescriptions are taken to a different pharmacy, please inform the nursing staff. The pharmacy listed in the electronic medical record should be the one where you would like electronic prescriptions to be sent.  Prescription refills: Only during scheduled appointments. Applies to both, written and electronic prescriptions.  NOTE: The following applies primarily to controlled substances (Opioid* Pain Medications).   Patient's responsibilities: 1. Pain Pills: Bring all pain pills to every appointment (except for procedure appointments). 2. Pill Bottles: Bring pills in original pharmacy bottle. Always bring newest bottle. Bring bottle, even if empty. 3. Medication refills: You are responsible for knowing and keeping track of what medications you need refilled. The day before your appointment, write a list of all prescriptions that need to be refilled. Bring that list to your appointment and give it to the admitting nurse. Prescriptions will be written only during appointments. If you forget a medication, it will not be "Called in", "Faxed", or "electronically sent". You will need to get another appointment to get these prescribed. 4. Prescription Accuracy: You are responsible for carefully inspecting your prescriptions before leaving our office. Have the discharge nurse carefully go over each prescription with you, before taking them home. Make sure that your name is accurately spelled, that your address is correct. Check the name and dose of your medication to make sure it is accurate. Check the number of pills, and the written instructions to make sure they are clear and accurate. Make sure that you are given enough medication to last  until your next medication refill appointment. 5. Taking Medication: Take medication as prescribed. Never take more pills than instructed. Never take medication more frequently than prescribed. Taking less pills or less frequently is permitted and encouraged, when it comes to controlled substances (written prescriptions).  6. Inform other Doctors: Always inform, all of your healthcare providers, of all the medications you take. 7. Pain Medication from other Providers: You are not allowed to accept any additional pain medication from any other Doctor or Healthcare provider. There are two exceptions to this rule. (see below) In the event that you require additional pain medication, you are responsible for notifying us, as stated below. 8. Medication Agreement: You are responsible for carefully reading and following our Medication Agreement. This must be signed before receiving any prescriptions from our practice. Safely store a copy of your signed Agreement. Violations to the Agreement will result in no further prescriptions. (Additional copies of our Medication Agreement are available upon request.) 9. Laws, Rules, & Regulations: All patients are expected to follow all Federal and State Laws, Statutes, Rules, & Regulations. Ignorance of the Laws does not constitute a valid excuse. The use of any illegal substances is prohibited. 10. Adopted CDC guidelines & recommendations: Target dosing levels will be at or below 60 MME/day. Use of benzodiazepines** is not recommended.  Exceptions: There are only two exceptions to the rule of not receiving pain medications from other Healthcare Providers. 1. Exception #1 (Emergencies): In the event of an emergency (i.e.: accident requiring emergency care), you are allowed to receive additional pain medication. However, you are responsible for: As soon as you are able, call our office [phone], at any time of the day or night, and leave a message stating your name, the  date and nature of the emergency, and the name and dose of the medication   prescribed. In the event that your call is answered by a member of our staff, make sure to document and save the date, time, and the name of the person that took your information.  2. Exception #2 (Planned Surgery): In the event that you are scheduled by another doctor or dentist to have any type of surgery or procedure, you are allowed (for a period no longer than 30 days), to receive additional pain medication, for the acute post-op pain. However, in this case, you are responsible for picking up a copy of our "Post-op Pain Management for Surgeons" handout, and giving it to your surgeon or dentist. This document is available at our office, and does not require an appointment to obtain it. Simply go to our office during business hours (Monday-Thursday from 8:00 AM to 4:00 PM) (Friday 8:00 AM to 12:00 Noon) or if you have a scheduled appointment with [place], prior to your surgery, and ask for it by name. In addition, you will need to provide [place] with your name, name of your surgeon, type of surgery, and date of procedure or surgery.  *Opioid medications include: morphine, codeine, oxycodone, oxymorphone, hydrocodone, hydromorphone, meperidine, tramadol, tapentadol, buprenorphine, fentanyl, methadone. **Benzodiazepine medications include: diazepam (Valium), alprazolam (Xanax), clonazepam (Klonopine), lorazepam (Ativan), clorazepate (Tranxene), chlordiazepoxide (Librium), estazolam (Prosom), oxazepam (Serax), temazepam (Restoril), triazolam (Halcion) (Last updated: 09/11/2017) ____________________________________________________________________________________________   BMI Assessment: Estimated body mass index is 40.27 kg/m as calculated from the following:   Height as of this encounter: 5\' 5"  (1.651 m).   Weight as of this encounter: 242 lb (109.8 kg).  BMI interpretation table: BMI level Category Range association with higher  incidence of chronic pain  <18 kg/m2 Underweight   18.5-24.9 kg/m2 Ideal body weight   25-29.9 kg/m2 Overweight Increased incidence by 20%  30-34.9 kg/m2 Obese (Class I) Increased incidence by 68%  35-39.9 kg/m2 Severe obesity (Class II) Increased incidence by 136%  >40 kg/m2 Extreme obesity (Class III) Increased incidence by 254%   Patient's current BMI Ideal Body weight  Body mass index is 40.27 kg/m. Ideal body weight: 57 kg (125 lb 10.6 oz) Adjusted ideal body weight: 78.1 kg (172 lb 3.2 oz)   BMI Readings from Last 4 Encounters:  04/21/18 40.27 kg/m  03/25/18 40.70 kg/m  02/12/18 39.94 kg/m  01/21/18 40.44 kg/m   Wt Readings from Last 4 Encounters:  04/21/18 242 lb (109.8 kg)  03/25/18 244 lb 9.6 oz (110.9 kg)  02/12/18 240 lb (108.9 kg)  01/21/18 243 lb (110.2 kg)

## 2018-04-26 LAB — TOXASSURE SELECT 13 (MW), URINE

## 2018-04-28 ENCOUNTER — Other Ambulatory Visit: Payer: Self-pay | Admitting: Nurse Practitioner

## 2018-04-28 ENCOUNTER — Telehealth: Payer: Self-pay

## 2018-04-28 DIAGNOSIS — M25552 Pain in left hip: Secondary | ICD-10-CM

## 2018-04-28 DIAGNOSIS — G8929 Other chronic pain: Secondary | ICD-10-CM

## 2018-04-28 DIAGNOSIS — M25551 Pain in right hip: Secondary | ICD-10-CM

## 2018-04-28 DIAGNOSIS — M160 Bilateral primary osteoarthritis of hip: Secondary | ICD-10-CM

## 2018-04-28 DIAGNOSIS — M7918 Myalgia, other site: Secondary | ICD-10-CM

## 2018-04-28 NOTE — Telephone Encounter (Signed)
Pt called and is having a lot of R hip pain, she received shot from [facility] with no relief. Pt wants to schedule Procedure with Dr [name]. Pt needs order to schedule.

## 2018-04-28 NOTE — Telephone Encounter (Signed)
Scheduled

## 2018-04-28 NOTE — Telephone Encounter (Signed)
Bilateral hip injection order placed She was only given Norflex injection.  Patient has an allergy to NSAIDs.

## 2018-04-28 NOTE — Telephone Encounter (Signed)
Patient c/o pain in right and left hip where the bone starts and around into the front of leg, patient has had her hips worked on before and would like to have this done again.

## 2018-04-30 ENCOUNTER — Other Ambulatory Visit: Payer: Self-pay

## 2018-04-30 ENCOUNTER — Ambulatory Visit
Admission: RE | Admit: 2018-04-30 | Discharge: 2018-04-30 | Disposition: A | Discharge status: Home / Self Care | Payer: Medicaid Other | Source: Ambulatory Visit | Attending: Pain Medicine | Admitting: Pain Medicine

## 2018-04-30 ENCOUNTER — Ambulatory Visit: Payer: Medicaid Other | Admitting: Pain Medicine

## 2018-04-30 ENCOUNTER — Encounter: Payer: Self-pay | Admitting: Pain Medicine

## 2018-04-30 VITALS — BP 115/74 | HR 83 | Temp 97.9°F | Resp 18 | Ht 65.0 in | Wt 242.0 lb

## 2018-04-30 DIAGNOSIS — Z96611 Presence of right artificial shoulder joint: Secondary | ICD-10-CM | POA: Insufficient documentation

## 2018-04-30 DIAGNOSIS — Z79899 Other long term (current) drug therapy: Secondary | ICD-10-CM | POA: Insufficient documentation

## 2018-04-30 DIAGNOSIS — Z91041 Radiographic dye allergy status: Secondary | ICD-10-CM | POA: Insufficient documentation

## 2018-04-30 DIAGNOSIS — M160 Bilateral primary osteoarthritis of hip: Secondary | ICD-10-CM

## 2018-04-30 DIAGNOSIS — Z79890 Hormone replacement therapy (postmenopausal): Secondary | ICD-10-CM | POA: Insufficient documentation

## 2018-04-30 DIAGNOSIS — Z96612 Presence of left artificial shoulder joint: Secondary | ICD-10-CM | POA: Insufficient documentation

## 2018-04-30 DIAGNOSIS — Z886 Allergy status to analgesic agent status: Secondary | ICD-10-CM | POA: Diagnosis not present

## 2018-04-30 DIAGNOSIS — M25552 Pain in left hip: Secondary | ICD-10-CM | POA: Insufficient documentation

## 2018-04-30 DIAGNOSIS — M25551 Pain in right hip: Secondary | ICD-10-CM | POA: Insufficient documentation

## 2018-04-30 DIAGNOSIS — M7918 Myalgia, other site: Secondary | ICD-10-CM

## 2018-04-30 DIAGNOSIS — Z888 Allergy status to other drugs, medicaments and biological substances status: Secondary | ICD-10-CM | POA: Diagnosis not present

## 2018-04-30 DIAGNOSIS — G8929 Other chronic pain: Secondary | ICD-10-CM | POA: Diagnosis not present

## 2018-04-30 DIAGNOSIS — Z885 Allergy status to narcotic agent status: Secondary | ICD-10-CM | POA: Insufficient documentation

## 2018-04-30 MED ORDER — METHYLPREDNISOLONE ACETATE 80 MG/ML IJ SUSP
INTRAMUSCULAR | Status: AC
  Filled 2018-04-30: qty 1

## 2018-04-30 MED ORDER — ROPIVACAINE HCL 2 MG/ML IJ SOLN
9.0000 mL | Freq: Once | INTRAMUSCULAR | Status: AC
  Administered 2018-04-30: 9 mL via INTRA_ARTICULAR

## 2018-04-30 MED ORDER — ROPIVACAINE HCL 2 MG/ML IJ SOLN
INTRAMUSCULAR | Status: AC
  Filled 2018-04-30: qty 10

## 2018-04-30 MED ORDER — MIDAZOLAM HCL 5 MG/5ML IJ SOLN
INTRAMUSCULAR | Status: AC
  Filled 2018-04-30: qty 5

## 2018-04-30 MED ORDER — METHYLPREDNISOLONE ACETATE 80 MG/ML IJ SUSP
80.0000 mg | Freq: Once | INTRAMUSCULAR | Status: AC
  Administered 2018-04-30: 80 mg via INTRA_ARTICULAR

## 2018-04-30 MED ORDER — LIDOCAINE HCL 2 % IJ SOLN
INTRAMUSCULAR | Status: AC
  Filled 2018-04-30: qty 20

## 2018-04-30 MED ORDER — MIDAZOLAM HCL 5 MG/5ML IJ SOLN
1.0000 mg | INTRAMUSCULAR | Status: DC | PRN
  Administered 2018-04-30: 4 mg via INTRAVENOUS

## 2018-04-30 MED ORDER — LIDOCAINE HCL 2 % IJ SOLN
20.0000 mL | Freq: Once | INTRAMUSCULAR | Status: AC
  Administered 2018-04-30: 400 mg

## 2018-04-30 MED ORDER — FENTANYL CITRATE (PF) 100 MCG/2ML IJ SOLN
INTRAMUSCULAR | Status: AC
  Filled 2018-04-30: qty 2

## 2018-04-30 MED ORDER — FENTANYL CITRATE (PF) 100 MCG/2ML IJ SOLN
25.0000 ug | INTRAMUSCULAR | Status: DC | PRN
  Administered 2018-04-30: 50 ug via INTRAVENOUS

## 2018-04-30 MED ORDER — LACTATED RINGERS IV SOLN
1000.0000 mL | Freq: Once | INTRAVENOUS | Status: AC
  Administered 2018-04-30: 1000 mL via INTRAVENOUS

## 2018-04-30 NOTE — Patient Instructions (Signed)

## 2018-04-30 NOTE — Progress Notes (Signed)
Safety precautions to be maintained throughout the outpatient stay will include: orient to surroundings, keep bed in low position, maintain call bell within reach at all times, provide assistance with transfer out of bed and ambulation.  

## 2018-04-30 NOTE — Progress Notes (Signed)
Patient's Name: Danielle [name]  MRN: [id number]  Referring Provider: Vevelyn Francois, NP  DOB: 1961-04-04  PCP: Marygrace Drought, MD  DOS: 04/30/2018  Note by: Gaspar Cola, MD  Service setting: Ambulatory outpatient  Specialty: Interventional Pain Management  Patient type: Established  Location: [facility] (AMB) Pain Management Facility  Visit type: Interventional Procedure   Primary Reason for Visit: Interventional Pain Management Treatment. CC: Hip Pain (bilateral)  Procedure:          Anesthesia, Analgesia, Anxiolysis:  Type: Intra-Articular Hip Injection #2  Primary Purpose: Diagnostic/Therapeutric Region: Posterolateral hip joint area. Level: Lower pelvic and hip joint level. Target Area: Superior aspect of the hip joint cavity, going thru the superior portion of the capsular ligament. Approach: Posterolateral approach. Laterality: Bilateral  Type: Moderate (Conscious) Sedation combined with Local Anesthesia Indication(s): Analgesia and Anxiety Route: Intravenous (IV) IV Access: Secured Sedation: Meaningful verbal contact was maintained at all times during the procedure  Local Anesthetic: Lidocaine 1-2%  Position: Prone Prepped Area: Entire Posterolateral hip area. Prepping solution: ChloraPrep (2% chlorhexidine gluconate and 70% isopropyl alcohol)   Indications: 1. 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)   2. Chronic hip pain (Secondary source of pain) (Bilateral) (R>L)    Pain Score: Pre-procedure: 6 /10 Post-procedure: 1 /10  Pre-op Assessment:  Danielle [name] is a 57 y.o. (year old), female patient, seen today for interventional treatment. She  has a past surgical history that includes necksurgery (10/21/2014); Back surgery (2013); Foot surgery (Right); Elbow surgery (Left); Carpal tunnel release (Right); Nose surgery; Partial hysterectomy; Total shoulder replacement (Bilateral); and Colonoscopy with propofol (N/A, 02/05/2017). Danielle [name] has a current medication list  which includes the following prescription(s): albuterol, cetirizine, cholecalciferol, citalopram, diclofenac sodium, docusate sodium, levothyroxine, magnesium hydroxide, naloxone, nystatin, oxycodone hcl, oxycodone hcl, oxycodone hcl, spiriva handihaler, and tizanidine, and the following Facility-Administered Medications: fentanyl and midazolam. Her primarily concern today is the Hip Pain (bilateral)  Initial Vital Signs:  Pulse/HCG Rate: 83ECG Heart Rate: 74 Temp: 98.4 F (36.9 C) Resp: 18 BP: (!) 149/89 SpO2: 99 %  BMI: Estimated body mass index is 40.27 kg/m as calculated from the following:   Height as of this encounter: 5\' 5"  (1.651 m).   Weight as of this encounter: 242 lb (109.8 kg).  Risk Assessment: Allergies: Reviewed. She is allergic to aripiprazole; duloxetine; gabapentin; gold; iodinated diagnostic agents; naproxen sodium; nortriptyline hcl; nsaids; pregabalin; meperidine; ace inhibitors; aspirin; cefpodoxime; fluoxetine; fluticasone-salmeterol; levofloxacin; lithium; paroxetine; tape; telithromycin; theophylline; topiramate; trazodone; triamcinolone; venlafaxine; bupropion; ketorolac tromethamine; morphine; and moxifloxacin.  Allergy Precautions: No radiological contrast used Coagulopathies: Reviewed. None identified.  Blood-thinner therapy: None at this time Active Infection(s): Reviewed. None identified. [name] is afebrile  Site Confirmation: Danielle [name] was asked to confirm the procedure and laterality before marking the site Procedure checklist: Completed Consent: Before the procedure and under the influence of no sedative(s), amnesic(s), or anxiolytics, the patient was informed of the treatment options, risks and possible complications. To fulfill our ethical and legal obligations, as recommended by the American Medical Association's Code of Ethics, I have informed the patient of my clinical impression; the nature and purpose of the treatment or procedure; the risks,  benefits, and possible complications of the intervention; the alternatives, including doing nothing; the risk(s) and benefit(s) of the alternative treatment(s) or procedure(s); and the risk(s) and benefit(s) of doing nothing. The patient was provided information about the general risks and possible complications associated with the procedure. These may include, but are not limited to: failure  to achieve desired goals, infection, bleeding, organ or nerve damage, allergic reactions, paralysis, and death. In addition, the patient was informed of those risks and complications associated to the procedure, such as failure to decrease pain; infection; bleeding; organ or nerve damage with subsequent damage to sensory, motor, and/or autonomic systems, resulting in permanent pain, numbness, and/or weakness of one or several areas of the body; allergic reactions; (i.e.: anaphylactic reaction); and/or death. Furthermore, the patient was informed of those risks and complications associated with the medications. These include, but are not limited to: allergic reactions (i.e.: anaphylactic or anaphylactoid reaction(s)); adrenal axis suppression; blood sugar elevation that in diabetics may result in ketoacidosis or comma; water retention that in patients with history of congestive heart failure may result in shortness of breath, pulmonary edema, and decompensation with resultant heart failure; weight gain; swelling or edema; medication-induced neural toxicity; particulate matter embolism and blood vessel occlusion with resultant organ, and/or nervous system infarction; and/or aseptic necrosis of one or more joints. Finally, the patient was informed that Medicine is not an exact science; therefore, there is also the possibility of unforeseen or unpredictable risks and/or possible complications that may result in a catastrophic outcome. The patient indicated having understood very clearly. We have given the patient no guarantees  and we have made no promises. Enough time was given to the patient to ask questions, all of which were answered to the patient's satisfaction. Danielle [name] has indicated that she wanted to continue with the procedure. Attestation: I, the ordering provider, attest that I have discussed with the patient the benefits, risks, side-effects, alternatives, likelihood of achieving goals, and potential problems during recovery for the procedure that I have provided informed consent. Date  Time: 04/30/2018  8:52 AM  Pre-Procedure Preparation:  Monitoring: As per clinic protocol. Respiration, ETCO2, SpO2, BP, heart rate and rhythm monitor placed and checked for adequate function Safety Precautions: Patient was assessed for positional comfort and pressure points before starting the procedure. Time-out: I initiated and conducted the "Time-out" before starting the procedure, as per protocol. The patient was asked to participate by confirming the accuracy of the "Time Out" information. Verification of the correct person, site, and procedure were performed and confirmed by me, the nursing staff, and the patient. "Time-out" conducted as per Joint Commission's Universal Protocol (UP.01.01.01). Time: 0949  Description of Procedure:          Safety Precautions: Aspiration looking for blood return was conducted prior to all injections. At no point did we inject any substances, as a needle was being advanced. No attempts were made at seeking any paresthesias. Safe injection practices and needle disposal techniques used. Medications properly checked for expiration dates. SDV (single dose vial) medications used. Description of the Procedure: Protocol guidelines were followed. The patient was placed in position over the fluoroscopy table. The target area was identified and the area prepped in the usual manner. Skin & deeper tissues infiltrated with local anesthetic. Appropriate amount of time allowed to pass for local  anesthetics to take effect. The procedure needles were then advanced to the target area. Proper needle placement secured. Negative aspiration confirmed. Solution injected in intermittent fashion, asking for systemic symptoms every 0.5cc of injectate. The needles were then removed and the area cleansed, making sure to leave some of the prepping solution back to take advantage of its long term bactericidal properties. Vitals:   04/30/18 0959 04/30/18 1009 04/30/18 1019 04/30/18 1029  BP: 120/65 123/62 115/74 115/74  Pulse:  Resp: (!) 9 18 18 18   Temp:  (!) 97.3 F (36.3 C)  97.9 F (36.6 C)  SpO2: 96% 95% 96% 99%  Weight:      Height:        Start Time: 0949 hrs. End Time: 0957 hrs. Materials:  Needle(s) Type: Spinal Needle Gauge: 22G Length: 5.0-in Medication(s): Please see orders for medications and dosing details.  Imaging Guidance (Non-Spinal):          Type of Imaging Technique: Fluoroscopy Guidance (Non-Spinal) Indication(s): Assistance in needle guidance and placement for procedures requiring needle placement in or near specific anatomical locations not easily accessible without such assistance. Exposure Time: Please see nurses notes. Contrast: Before injecting any contrast, we confirmed that the patient did not have an allergy to iodine, shellfish, or radiological contrast. Once satisfactory needle placement was completed at the desired level, radiological contrast was injected. Contrast injected under live fluoroscopy. No contrast complications. See chart for type and volume of contrast used. Fluoroscopic Guidance: I was personally present during the use of fluoroscopy. "Tunnel Vision Technique" used to obtain the best possible view of the target area. Parallax error corrected before commencing the procedure. "Direction-depth-direction" technique used to introduce the needle under continuous pulsed fluoroscopy. Once target was reached, antero-posterior, oblique, and lateral  fluoroscopic projection used confirm needle placement in all planes. Images permanently stored in EMR. Interpretation: I personally interpreted the imaging intraoperatively. Adequate needle placement confirmed in multiple planes. Appropriate spread of contrast into desired area was observed. No evidence of afferent or efferent intravascular uptake. Permanent images saved into the patient's record.  Antibiotic Prophylaxis:   Anti-infectives (From admission, onward)   None     Indication(s): None identified  Post-operative Assessment:  Post-procedure Vital Signs:  Pulse/HCG Rate: 8390 Temp: 97.9 F (36.6 C) Resp: 18 BP: 115/74 SpO2: 99 %  EBL: None  Complications: No immediate post-treatment complications observed by team, or reported by patient.  Note: The patient tolerated the entire procedure well. A repeat set of vitals were taken after the procedure and the patient was kept under observation following institutional policy, for this type of procedure. Post-procedural neurological assessment was performed, showing return to baseline, prior to discharge. The patient was provided with post-procedure discharge instructions, including a section on how to identify potential problems. Should any problems arise concerning this procedure, the patient was given instructions to immediately contact us, at any time, without hesitation. In any case, we plan to contact the patient by telephone for a follow-up status report regarding this interventional procedure.  Comments:  No additional relevant information.  Plan of Care    Imaging Orders     DG C-Arm 1-60 Min-No Report  Procedure Orders     HIP INJECTION  Medications ordered for procedure: Meds ordered this encounter  Medications  . lidocaine (XYLOCAINE) 2 % (with pres) injection 400 mg  . midazolam (VERSED) 5 MG/5ML injection 1-2 mg    Make sure Flumazenil is available in the pyxis when using this medication. If oversedation occurs,  administer 0.2 mg IV over 15 sec. If after 45 sec no response, administer 0.2 mg again over 1 min; may repeat at 1 min intervals; not to exceed 4 doses (1 mg)  . fentaNYL (SUBLIMAZE) injection 25-50 mcg    Make sure Narcan is available in the pyxis when using this medication. In the event of respiratory depression (RR< 8/min): Titrate NARCAN (naloxone) in increments of 0.1 to 0.2 mg IV at 2-3 minute intervals, until desired degree  of reversal.  . lactated ringers infusion 1,000 mL  . ropivacaine (PF) 2 mg/mL (0.2%) (NAROPIN) injection 9 mL  . methylPREDNISolone acetate (DEPO-MEDROL) injection 80 mg   Medications administered: We administered lidocaine, midazolam, fentaNYL, lactated ringers, ropivacaine (PF) 2 mg/mL (0.2%), and methylPREDNISolone acetate.  See the medical record for exact dosing, route, and time of administration.  Disposition: Discharge home  Discharge Date & Time: 04/30/2018; 1031 hrs.   Physician-requested Follow-up: Return for post-procedure eval (2 wks), w/ Dr. [name].  Future Appointments  Date Time Provider [place]  05/13/2018  2:00 PM Milinda Pointer, MD [facility] None  06/24/2018  3:00 PM [facility] None  06/24/2018  4:00 PM Kris Hartmann, NP [facility] None  07/23/2018 11:15 AM Vevelyn Francois, NP [facility] None   Primary Care Physician: Marygrace Drought, MD Location: [facility] Out[facility] Facility Note by: Gaspar Cola, MD Date: 04/30/2018; Time: 10:59 AM  Disclaimer:  Medicine is not an exact science. The only guarantee in medicine is that nothing is guaranteed. It is important to note that the decision to proceed with this intervention was based on the information collected from the patient. The Data and conclusions were drawn from the patient's questionnaire, the interview, and the physical examination. Because the information was provided in large part by the patient, it cannot be guaranteed that it has not been  purposely or unconsciously manipulated. Every effort has been made to obtain as much relevant data as possible for this evaluation. It is important to note that the conclusions that lead to this procedure are derived in large part from the available data. Always take into account that the treatment will also be dependent on availability of resources and existing treatment guidelines, considered by other Pain Management Practitioners as being common knowledge and practice, at the time of the intervention. For Medico-Legal purposes, it is also important to point out that variation in procedural techniques and pharmacological choices are the acceptable norm. The indications, contraindications, technique, and results of the above procedure should only be interpreted and judged by a Board-Certified Interventional Pain Specialist with extensive familiarity and expertise in the same exact procedure and technique.

## 2018-05-01 ENCOUNTER — Telehealth: Payer: Self-pay

## 2018-05-01 NOTE — Telephone Encounter (Signed)
Post procedure phone call.  LM 

## 2018-05-12 NOTE — Progress Notes (Signed)
Patient's Name: Danielle [name]  MRN: [id number]  Referring Provider: Marygrace Drought, MD  DOB: 1960/12/15  PCP: Marygrace Drought, MD  DOS: 05/13/2018  Note by: Gaspar Cola, MD  Service setting: Ambulatory outpatient  Specialty: Interventional Pain Management  Location: [facility] (AMB) Pain Management Facility    Patient type: Established   Primary Reason(s) for Visit: Encounter for post-procedure evaluation of chronic illness with mild to moderate exacerbation CC: Hip Pain  HPI  Danielle [name] is a 57 y.o. year old, female patient, who comes today for a post-procedure evaluation. She has Long term prescription opiate use; Opiate use (60 MME/Day); B12 deficiency; Cervical spinal cord compression [employer]) (April, 2017); Cervical spinal stenosis; Chronic obstructive pulmonary disease ([place]); Clinical depression; Adult hypothyroidism; Adiposity; Mucositis oral; Groin pain; Current tobacco use; Chronic shoulder pain (Primary Source of Pain) (Bilateral) (R>L); History of total shoulder replacement (Bilateral); Chronic low back pain [facility] source of pain) (Bilateral) (R>L); Lumbar facet syndrome (Bilateral) (R>L); Lumbar spondylosis; Failed back surgical syndrome; Chronic lower extremity pain (Right); Chronic neck pain (Bilateral) (R>L); Chronic cervical radicular pain (Right); Ulnar neuropathy (Left); Hx of cervical spine surgery; Cervical spondylosis; Encounter for therapeutic drug level monitoring; Encounter for pain management planning; Fibromyalgia; Chronic sacroiliac pain (Bilateral) (R>L); Seasonal allergic rhinitis; S/P total replacement of shoulder (Right); Hypertension; Chronic pain syndrome; Complaints of weakness of lower extremity; Morbid obesity with BMI of 40.0-44.9, adult ([place]); Anterolisthesis; Cervical facet syndrome ([place]); Chronic musculoskeletal pain; Muscle spasm of back; Encounter for screening colonoscopy; Rectal bleeding; Mixed simple and mucopurulent chronic bronchitis ([place]); Nausea;  Vasovagal episode; Myofascial pain; Chronic hip pain (Secondary source of pain) (Bilateral) (R>L); 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 Her primarily concern today is the Hip Pain  Pain Assessment: Location: Right, Left Hip(the right hip hurts the worse) Radiating: pain radiaties down right leg to ankle Onset: More than a month ago Duration: Chronic pain Quality: Constant, Sharp Severity: 3 /10 (subjective, self-reported pain score)  Note: Reported level is compatible with observation.                         When using our objective Pain Scale, levels between 6 and 10/10 are said to belong in an emergency room, as it progressively worsens from a 6/10, described as severely limiting, requiring emergency care not usually available at an outpatient pain management facility. At a 6/10 level, communication becomes difficult and requires great effort. Assistance to reach the emergency department may be required. Facial flushing and profuse sweating along with potentially dangerous increases in heart rate and blood pressure will be evident. Effect on ADL: i cant do much Timing: Constant Modifying factors: heating pad BP: (!) 128/97  HR: 81  Danielle [name] comes in today for post-procedure evaluation.  Once again, the patient has obtained good relief of the pain from the intra-articular hip injection, however, the pain seems to be returning.  At this point, I find the necessary to order MRIs of the hips to further assess her osteoarthritis.  Should the degeneration has advanced to the point where surgery may be needed, we will be referring her to  the orthopedic surgeons for that determination.  In addition, the patient complains of  persistent pain in the area where she experienced a lower extremity cellulitis.  The area does seem to be tender to palpation with hyperalgesia over the skin.  Even though she does not seem to have redness or increased temperature to palpation, I can still feel an area of induration under the skin.  She indicates having completed her initial antibiotics.  She thinks that the infection is still present.  I will order a CBC and differential to evaluate this.  I have instructed to have the patient follow-up with her primary care physician for a possible repeat course of antibiotics.  Further details on both, my assessment(s), as well as the proposed treatment plan, please see below.  Post-Procedure Assessment  04/30/2018 Procedure: Diagnostic bilateral intra-articular hip joint injection #2 under fluoroscopic guidance and IV sedation Pre-procedure pain score:  6/10 Post-procedure pain score: 1/10 (> 50% relief) Influential Factors: BMI: 40.44 kg/m Intra-procedural challenges: None observed.         Assessment challenges: None detected.              Reported side-effects: None.        Post-procedural adverse reactions or complications: None reported         Sedation: Sedation provided. When no sedatives are used, the analgesic levels obtained are directly associated to the effectiveness of the local anesthetics. However, when sedation is provided, the level of analgesia obtained during the initial 1 hour following the intervention, is believed to be the result of a combination of factors. These factors may include, but are not limited to: 1. The effectiveness of the local anesthetics used. 2. The effects of the analgesic(s) and/or anxiolytic(s) used. 3. The degree of discomfort experienced by the patient at the time of the procedure. 4. The patients ability and reliability in recalling and recording the events. 5.  The presence and influence of possible secondary gains and/or psychosocial factors. Reported result: Relief experienced during the 1st hour after the procedure: 100 % (Ultra-Short Term Relief)            Interpretative annotation: Clinically appropriate result. Analgesia during this period is likely to be Local Anesthetic and/or IV Sedative (Analgesic/Anxiolytic) related.          Effects of local anesthetic: The analgesic effects attained during this period are directly associated to the localized infiltration of local anesthetics and therefore cary significant diagnostic value as to the etiological location, or anatomical origin, of the pain. Expected duration of relief is directly dependent on the pharmacodynamics of the local anesthetic used. Long-acting (4-6 hours) anesthetics used.  Reported result: Relief during the next 4 to 6 hour after the procedure: 75 %(75 after numbness) (Short-Term Relief)            Interpretative annotation: Clinically appropriate result. Analgesia during this period is likely to be Local Anesthetic-related.          Long-term benefit: Defined as the period of time past the expected duration of local anesthetics (1 hour for short-acting and 4-6 hours for long-acting). With the possible exception of prolonged sympathetic blockade from the local anesthetics, benefits during this period are typically attributed to, or associated with, other factors such as analgesic sensory neuropraxia, antiinflammatory effects, or beneficial biochemical changes provided by agents other than the local anesthetics.  Reported result: Extended relief following procedure: 75 % (Long-Term Relief)            Interpretative annotation: Clinically possible results. Good relief. No permanent benefit expected. Inflammation plays a part  in the etiology to the pain.          Current benefits: Defined as reported results that persistent at this point in time.   Analgesia: >50 %            Function: [name] reports improvement in function ROM: Danielle [name] reports improvement in ROM Interpretative annotation: Ongoing benefit. Therapeutic benefit observed. Effective diagnostic intervention.          Interpretation: Results would suggest a successful diagnostic intervention.                  Plan:  Please see "Plan of Care" for details.                Laboratory Chemistry  Inflammation Markers (CRP: Acute Phase) (ESR: Chronic Phase) Lab Results  Component Value Date   CRP 0.9 01/08/2016   ESRSEDRATE 37 (H) 01/08/2016                         Rheumatology Markers No results found.  Renal Markers Lab Results  Component Value Date   BUN 8 01/08/2016   CREATININE 0.94 01/08/2016   GFRAA >60 01/08/2016   GFRNONAA >60 01/08/2016                             Hepatic Markers Lab Results  Component Value Date   AST 18 01/08/2016   ALT 14 01/08/2016   ALBUMIN 3.7 01/08/2016                        Neuropathy Markers Lab Results  Component Value Date   VITAMINB12 >7,500 (H) 01/08/2016                        Hematology Parameters No results found.  CV Markers No results found.  Note: Lab results reviewed.  Recent Imaging Results   Results for orders placed in visit on 04/30/18  DG C-Arm 1-60 Min-No Report   Narrative Fluoroscopy was utilized by the requesting physician.  No radiographic  interpretation.    Interpretation Report: Fluoroscopy was used during the procedure to assist with needle guidance. The images were interpreted intraoperatively by the requesting physician.  Meds   Current Outpatient Medications:  .  albuterol (PROVENTIL HFA;VENTOLIN HFA) 108 (90 Base) MCG/ACT inhaler, Inhale 2 puffs into the lungs every 4 (four) hours as needed for wheezing or shortness of breath. , Disp: , Rfl:  .  cholecalciferol (VITAMIN D) 1000 units tablet, Take 1,000 Units by mouth daily., Disp: , Rfl:  .  citalopram (CELEXA) 20 MG tablet, Take 20 mg by mouth daily., Disp: ,  Rfl: 3 .  diclofenac sodium (VOLTAREN) 1 % GEL, Apply 2 g topically 4 (four) times daily., Disp: 100 g, Rfl: 2 .  docusate sodium (COLACE) 100 MG capsule, Take 100 mg by mouth 2 (two) times daily., Disp: , Rfl:  .  levothyroxine (SYNTHROID, LEVOTHROID) 50 MCG tablet, TAKE 1 TABLET (50 MCG TOTAL) BY MOUTH DAILY AT 0600., Disp: , Rfl: 11 .  magnesium hydroxide (MILK OF MAGNESIA) 400 MG/5ML suspension, Take by mouth., Disp: , Rfl:  .  naloxone (NARCAN) 2 MG/2ML injection, Inject content of syringe into thigh muscle. Call 911., Disp: 2 Syringe, Rfl: 1 .  nystatin (MYCOSTATIN/NYSTOP) powder, Apply topically., Disp: , Rfl:  .  [START ON 06/22/2018] Oxycodone HCl 10   MG TABS, Take 1 tablet (10 mg total) by mouth every 6 (six) hours as needed., Disp: 120 tablet, Rfl: 0 .  [START ON 05/23/2018] Oxycodone HCl 10 MG TABS, Take 1 tablet (10 mg total) by mouth every 6 (six) hours as needed., Disp: 120 tablet, Rfl: 0 .  Oxycodone HCl 10 MG TABS, Take 1 tablet (10 mg total) by mouth every 6 (six) hours as needed., Disp: 120 tablet, Rfl: 0 .  SPIRIVA HANDIHALER 18 MCG inhalation capsule, PLACE 1 CAPSULE (18 MCG TOTAL) INTO INHALER AND INHALE ONCE DAILY., Disp: , Rfl: 5 .  tiZANidine (ZANAFLEX) 4 MG tablet, Take 1 tablet (4 mg total) by mouth every 8 (eight) hours as needed for muscle spasms., Disp: 90 tablet, Rfl: 5 .  cetirizine (ZYRTEC) 10 MG tablet, Take 10 mg by mouth daily. , Disp: , Rfl:   ROS  Constitutional: Denies any fever or chills Gastrointestinal: No reported hemesis, hematochezia, vomiting, or acute GI distress Musculoskeletal: Denies any acute onset joint swelling, redness, loss of ROM, or weakness Neurological: No reported episodes of acute onset apraxia, aphasia, dysarthria, agnosia, amnesia, paralysis, loss of coordination, or loss of consciousness  Allergies  Danielle [name] is allergic to aripiprazole; duloxetine; gabapentin; gold; iodinated diagnostic agents; naproxen sodium; nortriptyline hcl;  nsaids; pregabalin; meperidine; ace inhibitors; aspirin; cefpodoxime; fluoxetine; fluticasone-salmeterol; levofloxacin; lithium; paroxetine; tape; telithromycin; theophylline; topiramate; trazodone; triamcinolone; venlafaxine; bupropion; ketorolac tromethamine; morphine; and moxifloxacin.  PFSH  Drug: Danielle [name]  reports that she does not use drugs. Alcohol:  reports that she does not drink alcohol. Tobacco:  reports that she has been smoking cigarettes. She has a 40.00 pack-year smoking history. She has never used smokeless tobacco. Medical:  has a past medical history of Acute postoperative pain (01/13/2017), Anxiety, Arthritis, Asthma, Bell's palsy, Bursitis, COPD (chronic obstructive pulmonary disease) (HCC), Depression, Fibromyalgia, Heart murmur, Hepatitis C, Hiatal hernia, Hyperlipidemia, Hypertension, IBS (irritable bowel syndrome), Insomnia, and Thyroid disease. Surgical: Danielle [name]  has a past surgical history that includes necksurgery (10/21/2014); Back surgery (2013); Foot surgery (Right); Elbow surgery (Left); Carpal tunnel release (Right); Nose surgery; Partial hysterectomy; Total shoulder replacement (Bilateral); and Colonoscopy with propofol (N/A, 02/05/2017). Family: family history includes Cancer in her mother; Gout in her mother; Heart disease in her father.  Constitutional Exam  General appearance: Well nourished, well developed, and well hydrated. In no apparent acute distress Vitals:   05/13/18 1434  BP: (!) 128/97  Pulse: 81  Temp: 98.3 F (36.8 C)  SpO2: 100%  Weight: 243 lb (110.2 kg)  Height: 5' 5" (1.651 m)   BMI Assessment: Estimated body mass index is 40.44 kg/m as calculated from the following:   Height as of this encounter: 5' 5" (1.651 m).   Weight as of this encounter: 243 lb (110.2 kg).  BMI interpretation table: BMI level Category Range association with higher incidence of chronic pain  <18 kg/m2 Underweight   18.5-24.9 kg/m2 Ideal body weight    25-29.9 kg/m2 Overweight Increased incidence by 20%  30-34.9 kg/m2 Obese (Class I) Increased incidence by 68%  35-39.9 kg/m2 Severe obesity (Class II) Increased incidence by 136%  >40 kg/m2 Extreme obesity (Class III) Increased incidence by 254%   Patient's current BMI Ideal Body weight  Body mass index is 40.44 kg/m. Ideal body weight: 57 kg (125 lb 10.6 oz) Adjusted ideal body weight: 78.3 kg (172 lb 9.6 oz)   BMI Readings from Last 4 Encounters:  05/13/18 40.44 kg/m  04/30/18 40.27 kg/m  04/21/18 40.27 kg/m    03/25/18 40.70 kg/m   Wt Readings from Last 4 Encounters:  05/13/18 243 lb (110.2 kg)  04/30/18 242 lb (109.8 kg)  04/21/18 242 lb (109.8 kg)  03/25/18 244 lb 9.6 oz (110.9 kg)  Psych/Mental status: Alert, oriented x 3 (person, place, & time)       Eyes: PERLA Respiratory: No evidence of acute respiratory distress  Cervical Spine Area Exam  Skin & Axial Inspection: No masses, redness, edema, swelling, or associated skin lesions Alignment: Symmetrical Functional ROM: Unrestricted ROM      Stability: No instability detected Muscle Tone/Strength: Functionally intact. No obvious neuro-muscular anomalies detected. Sensory (Neurological): Unimpaired Palpation: No palpable anomalies              Upper Extremity (UE) Exam    Side: Right upper extremity  Side: Left upper extremity  Skin & Extremity Inspection: Skin color, temperature, and hair growth are WNL. No peripheral edema or cyanosis. No masses, redness, swelling, asymmetry, or associated skin lesions. No contractures.  Skin & Extremity Inspection: Skin color, temperature, and hair growth are WNL. No peripheral edema or cyanosis. No masses, redness, swelling, asymmetry, or associated skin lesions. No contractures.  Functional ROM: Unrestricted ROM          Functional ROM: Unrestricted ROM          Muscle Tone/Strength: Functionally intact. No obvious neuro-muscular anomalies detected.  Muscle Tone/Strength:  Functionally intact. No obvious neuro-muscular anomalies detected.  Sensory (Neurological): Unimpaired          Sensory (Neurological): Unimpaired          Palpation: No palpable anomalies              Palpation: No palpable anomalies              Provocative Test(s):  Phalen's test: deferred Tinel's test: deferred Apley's scratch test (touch opposite shoulder):  Action 1 (Across chest): deferred Action 2 (Overhead): deferred Action 3 (LB reach): deferred   Provocative Test(s):  Phalen's test: deferred Tinel's test: deferred Apley's scratch test (touch opposite shoulder):  Action 1 (Across chest): deferred Action 2 (Overhead): deferred Action 3 (LB reach): deferred    Thoracic Spine Area Exam  Skin & Axial Inspection: No masses, redness, or swelling Alignment: Symmetrical Functional ROM: Unrestricted ROM Stability: No instability detected Muscle Tone/Strength: Functionally intact. No obvious neuro-muscular anomalies detected. Sensory (Neurological): Unimpaired Muscle strength & Tone: No palpable anomalies  Lumbar Spine Area Exam  Skin & Axial Inspection: No masses, redness, or swelling Alignment: Symmetrical Functional ROM: Unrestricted ROM       Stability: No instability detected Muscle Tone/Strength: Functionally intact. No obvious neuro-muscular anomalies detected. Sensory (Neurological): Unimpaired Palpation: No palpable anomalies       Provocative Tests: Hyperextension/rotation test: deferred today       Lumbar quadrant test (Kemp's test): deferred today       Lateral bending test: deferred today       Patrick's Maneuver: deferred today                   FABER test: deferred today                   S-I anterior distraction/compression test: deferred today         S-I lateral compression test: deferred today         S-I Thigh-thrust test: deferred today         S-I Gaenslen's test: deferred today  Gait & Posture Assessment  Ambulation: Unassisted Gait:  Relatively normal for age and body habitus Posture: WNL   Lower Extremity Exam    Side: Right lower extremity  Side: Left lower extremity  Stability: No instability observed          Stability: No instability observed          Skin & Extremity Inspection: Skin color, temperature, and hair growth are WNL. No peripheral edema or cyanosis. No masses, redness, swelling, asymmetry, or associated skin lesions. No contractures.  Skin & Extremity Inspection: Skin color, temperature, and hair growth are WNL. No peripheral edema or cyanosis. No masses, redness, swelling, asymmetry, or associated skin lesions. No contractures.  Functional ROM: Unrestricted ROM                  Functional ROM: Unrestricted ROM                  Muscle Tone/Strength: Functionally intact. No obvious neuro-muscular anomalies detected.  Muscle Tone/Strength: Functionally intact. No obvious neuro-muscular anomalies detected.  Sensory (Neurological): Unimpaired  Sensory (Neurological): Unimpaired  Palpation: No palpable anomalies  Palpation: No palpable anomalies   Assessment  Primary Diagnosis & Pertinent Problem List: The primary encounter diagnosis was Chronic shoulder pain (Primary Source of Pain) (Bilateral) (R>L). Diagnoses of Chronic hip pain (Secondary source of pain) (Bilateral) (R>L), Chronic low back pain (Tertiary source of pain) (Bilateral) (R>L), Cervical spinal cord compression (HCC) (April, 2017), 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), Chronic pain syndrome, and Cellulitis of leg, right were also pertinent to this visit.  Status Diagnosis  Controlled Controlled Controlled 1. Chronic shoulder pain (Primary Source of Pain) (Bilateral) (R>L)   2. Chronic hip pain (Secondary source of pain) (Bilateral) (R>L)   3. Chronic low back pain (Tertiary source of pain) (Bilateral) (R>L)   4. Cervical spinal cord compression (HCC) (April, 2017)   5. 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 Chronic pain syndrome   7.  Cellulitis of leg, right     Problems updated and reviewed during this visit: No problems updated. Plan of Care  Pharmacotherapy (Medications Ordered): No orders of the defined types were placed in this encounter.  Medications administered today: Crystallynn A. Massett "ANGIE" had no medications administered during this visit.  Procedure Orders    No procedure(s) ordered today    Lab Orders     CBC with Differential/Platelet  Imaging Orders     DG HIP UNILAT W OR W/O PELVIS 2-3 VIEWS RIGHT     DG HIP UNILAT W OR W/O PELVIS 2-3 VIEWS LEFT     MR HIP LEFT WO CONTRAST     MR HIP RIGHT WO CONTRAST     US Venous Img Lower Unilateral Right Referral Orders  No referral(s) requested today   Interventional management options: Planned, scheduled, and/or pending:   MRI of the hip joint, bilaterally.   Considering:   Palliative bilateralsuprascapular nerve block  Possible bilateral suprascapular nerve RFA Diagnostic bilateral intra-articular hip joint injection  Possible bilateral hip joint RFA  Diagnostic bilateral lumbar facet block  Possible bilaterallumbar facet RFA  Diagnostic caudal epiduralsteroid injection + diagnostic epidurogram  Possible Racz procedure.  Diagnostic bilateral cervical facet block  Possible bilateral cervical facet RFA   Diagnostic right-sided cervical epidural steroid injection  Diagnostic bilateral sacroiliac joint block  Possible bilateral sacroiliac joint RFA    Palliative PRN treatment(s):   Palliative bilateralsuprascapular nerve block  Diagnostic bilateral intra-articular hip joint injection  Diagnostic bilateral   lumbar facet block  Palliative right-sided lumbar facet RFA #2 (last one done on 12/16/2017) Palliative left-sided lumbar facet RFA #2 (last one done on 02/12/2018) Diagnostic caudal epiduralsteroid injection + diagnostic epidurogram  Diagnostic bilateral cervical facet block  Diagnostic right-sided cervical epidural steroid injection   Diagnostic bilateral sacroiliac joint block    Provider-requested follow-up: Return for F/U eval (after test completion).  Future Appointments  Date Time Provider Department Center  06/03/2018 11:30 AM Naveira, Francisco, MD [facility] None  06/24/2018  3:00 PM [facility] 1 [facility] None  06/24/2018  4:00 PM Brown, Fallon E, NP [facility] None  07/23/2018 11:15 AM King, Crystal M, NP [facility] None   Primary Care Physician: Heffington, Mark, MD Location: [facility] Note by: Francisco A Naveira, MD Date: 05/13/2018; Time: 3:26 PM 

## 2018-05-13 ENCOUNTER — Other Ambulatory Visit: Payer: Self-pay

## 2018-05-13 ENCOUNTER — Ambulatory Visit: Payer: Medicaid Other | Attending: Pain Medicine | Admitting: Pain Medicine

## 2018-05-13 ENCOUNTER — Encounter: Payer: Self-pay | Admitting: Pain Medicine

## 2018-05-13 VITALS — BP 128/97 | HR 81 | Temp 98.3°F | Ht 65.0 in | Wt 243.0 lb

## 2018-05-13 DIAGNOSIS — G894 Chronic pain syndrome: Secondary | ICD-10-CM

## 2018-05-13 DIAGNOSIS — F329 Major depressive disorder, single episode, unspecified: Secondary | ICD-10-CM | POA: Diagnosis not present

## 2018-05-13 DIAGNOSIS — E785 Hyperlipidemia, unspecified: Secondary | ICD-10-CM | POA: Diagnosis not present

## 2018-05-13 DIAGNOSIS — M62830 Muscle spasm of back: Secondary | ICD-10-CM | POA: Insufficient documentation

## 2018-05-13 DIAGNOSIS — M160 Bilateral primary osteoarthritis of hip: Secondary | ICD-10-CM | POA: Diagnosis not present

## 2018-05-13 DIAGNOSIS — I10 Essential (primary) hypertension: Secondary | ICD-10-CM | POA: Insufficient documentation

## 2018-05-13 DIAGNOSIS — F419 Anxiety disorder, unspecified: Secondary | ICD-10-CM | POA: Insufficient documentation

## 2018-05-13 DIAGNOSIS — M25551 Pain in right hip: Secondary | ICD-10-CM | POA: Diagnosis not present

## 2018-05-13 DIAGNOSIS — Z79899 Other long term (current) drug therapy: Secondary | ICD-10-CM | POA: Insufficient documentation

## 2018-05-13 DIAGNOSIS — M25552 Pain in left hip: Secondary | ICD-10-CM

## 2018-05-13 DIAGNOSIS — Z881 Allergy status to other antibiotic agents status: Secondary | ICD-10-CM | POA: Insufficient documentation

## 2018-05-13 DIAGNOSIS — M4802 Spinal stenosis, cervical region: Secondary | ICD-10-CM | POA: Insufficient documentation

## 2018-05-13 DIAGNOSIS — E039 Hypothyroidism, unspecified: Secondary | ICD-10-CM | POA: Diagnosis not present

## 2018-05-13 DIAGNOSIS — G4700 Insomnia, unspecified: Secondary | ICD-10-CM | POA: Diagnosis not present

## 2018-05-13 DIAGNOSIS — J449 Chronic obstructive pulmonary disease, unspecified: Secondary | ICD-10-CM | POA: Diagnosis not present

## 2018-05-13 DIAGNOSIS — M25511 Pain in right shoulder: Secondary | ICD-10-CM | POA: Diagnosis not present

## 2018-05-13 DIAGNOSIS — L03115 Cellulitis of right lower limb: Secondary | ICD-10-CM

## 2018-05-13 DIAGNOSIS — K589 Irritable bowel syndrome without diarrhea: Secondary | ICD-10-CM | POA: Diagnosis not present

## 2018-05-13 DIAGNOSIS — F17210 Nicotine dependence, cigarettes, uncomplicated: Secondary | ICD-10-CM | POA: Diagnosis not present

## 2018-05-13 DIAGNOSIS — Z96611 Presence of right artificial shoulder joint: Secondary | ICD-10-CM | POA: Insufficient documentation

## 2018-05-13 DIAGNOSIS — Z91041 Radiographic dye allergy status: Secondary | ICD-10-CM | POA: Insufficient documentation

## 2018-05-13 DIAGNOSIS — M5441 Lumbago with sciatica, right side: Secondary | ICD-10-CM | POA: Diagnosis not present

## 2018-05-13 DIAGNOSIS — Z96612 Presence of left artificial shoulder joint: Secondary | ICD-10-CM | POA: Insufficient documentation

## 2018-05-13 DIAGNOSIS — Z79890 Hormone replacement therapy (postmenopausal): Secondary | ICD-10-CM | POA: Diagnosis not present

## 2018-05-13 DIAGNOSIS — G8929 Other chronic pain: Secondary | ICD-10-CM

## 2018-05-13 DIAGNOSIS — Z79891 Long term (current) use of opiate analgesic: Secondary | ICD-10-CM | POA: Insufficient documentation

## 2018-05-13 DIAGNOSIS — Z886 Allergy status to analgesic agent status: Secondary | ICD-10-CM | POA: Insufficient documentation

## 2018-05-13 DIAGNOSIS — M25512 Pain in left shoulder: Secondary | ICD-10-CM

## 2018-05-13 DIAGNOSIS — M545 Low back pain: Secondary | ICD-10-CM | POA: Insufficient documentation

## 2018-05-13 DIAGNOSIS — Z6841 Body Mass Index (BMI) 40.0 and over, adult: Secondary | ICD-10-CM | POA: Insufficient documentation

## 2018-05-13 DIAGNOSIS — M961 Postlaminectomy syndrome, not elsewhere classified: Secondary | ICD-10-CM | POA: Insufficient documentation

## 2018-05-13 DIAGNOSIS — M4310 Spondylolisthesis, site unspecified: Secondary | ICD-10-CM | POA: Insufficient documentation

## 2018-05-13 DIAGNOSIS — Z885 Allergy status to narcotic agent status: Secondary | ICD-10-CM | POA: Insufficient documentation

## 2018-05-13 DIAGNOSIS — M25559 Pain in unspecified hip: Secondary | ICD-10-CM | POA: Diagnosis present

## 2018-05-13 DIAGNOSIS — M797 Fibromyalgia: Secondary | ICD-10-CM | POA: Insufficient documentation

## 2018-05-13 DIAGNOSIS — Z8619 Personal history of other infectious and parasitic diseases: Secondary | ICD-10-CM | POA: Diagnosis not present

## 2018-05-13 DIAGNOSIS — Z8249 Family history of ischemic heart disease and other diseases of the circulatory system: Secondary | ICD-10-CM | POA: Insufficient documentation

## 2018-05-13 DIAGNOSIS — Z888 Allergy status to other drugs, medicaments and biological substances status: Secondary | ICD-10-CM | POA: Insufficient documentation

## 2018-05-13 DIAGNOSIS — G9520 Unspecified cord compression: Secondary | ICD-10-CM

## 2018-05-14 LAB — CBC WITH DIFFERENTIAL/PLATELET
Basophils Absolute: 0.1 10*3/uL (ref 0.0–0.2)
Basos: 1 %
EOS (ABSOLUTE): 0.3 10*3/uL (ref 0.0–0.4)
EOS: 3 %
HEMOGLOBIN: 13.9 g/dL (ref 11.1–15.9)
Hematocrit: 41.8 % (ref 34.0–46.6)
IMMATURE GRANULOCYTES: 0 %
Immature Grans (Abs): 0 10*3/uL (ref 0.0–0.1)
Lymphocytes Absolute: 3.2 10*3/uL — ABNORMAL HIGH (ref 0.7–3.1)
Lymphs: 29 %
MCH: 30.8 pg (ref 26.6–33.0)
MCHC: 33.3 g/dL (ref 31.5–35.7)
MCV: 93 fL (ref 79–97)
MONOCYTES: 7 %
Monocytes Absolute: 0.8 10*3/uL (ref 0.1–0.9)
NEUTROS PCT: 60 %
Neutrophils Absolute: 6.7 10*3/uL (ref 1.4–7.0)
Platelets: 295 10*3/uL (ref 150–450)
RBC: 4.51 x10E6/uL (ref 3.77–5.28)
RDW: 12.2 % — ABNORMAL LOW (ref 12.3–15.4)
WBC: 11.1 10*3/uL — AB (ref 3.4–10.8)

## 2018-05-18 ENCOUNTER — Ambulatory Visit
Admission: RE | Admit: 2018-05-18 | Discharge: 2018-05-18 | Disposition: A | Discharge status: Home / Self Care | Payer: Medicaid Other | Source: Ambulatory Visit | Attending: Pain Medicine | Admitting: Pain Medicine

## 2018-05-18 DIAGNOSIS — M25551 Pain in right hip: Secondary | ICD-10-CM | POA: Insufficient documentation

## 2018-05-18 DIAGNOSIS — M25552 Pain in left hip: Principal | ICD-10-CM

## 2018-05-18 DIAGNOSIS — G8929 Other chronic pain: Secondary | ICD-10-CM

## 2018-05-18 DIAGNOSIS — M160 Bilateral primary osteoarthritis of hip: Secondary | ICD-10-CM

## 2018-05-18 DIAGNOSIS — M71452 Calcium deposit in bursa, left hip: Secondary | ICD-10-CM | POA: Insufficient documentation

## 2018-06-02 NOTE — Progress Notes (Deleted)
Patient's Name: Danielle [name]  MRN: [id number]  Referring Provider: Marygrace Drought, MD  DOB: 1960/11/15  PCP: Marygrace Drought, MD  DOS: 06/03/2018  Note by: Gaspar Cola, MD  Service setting: Ambulatory outpatient  Specialty: Interventional Pain Management  Location: [facility] (AMB) Pain Management Facility    Patient type: Established   Primary Reason(s) for Visit: Evaluation of chronic illnesses with exacerbation, or progression (Level of risk: moderate) CC: No chief complaint on file.  HPI  Danielle [name] is a 57 y.o. year old, female patient, who comes today for a follow-up evaluation. She has Long term prescription opiate use; Opiate use (60 MME/Day); B12 deficiency; Cervical spinal cord compression [facility]) (April, 2017); Cervical spinal stenosis; Chronic obstructive pulmonary disease ([place]); Clinical depression; Adult hypothyroidism; Adiposity; Mucositis oral; Groin pain; Current tobacco use; Chronic shoulder pain (Primary Source of Pain) (Bilateral) (R>L); History of total shoulder replacement (Bilateral); Chronic low back pain [facility] source of pain) (Bilateral) (R>L); Lumbar facet syndrome (Bilateral) (R>L); Lumbar spondylosis; Failed back surgical syndrome; Chronic lower extremity pain (Right); Chronic neck pain (Bilateral) (R>L); Chronic cervical radicular pain (Right); Ulnar neuropathy (Left); Hx of cervical spine surgery; Cervical spondylosis; Encounter for therapeutic drug level monitoring; Encounter for pain management planning; Fibromyalgia; Chronic sacroiliac pain (Bilateral) (R>L); Seasonal allergic rhinitis; S/P total replacement of shoulder (Right); Hypertension; Chronic pain syndrome; Complaints of weakness of lower extremity; Morbid obesity with BMI of 40.0-44.9, adult ([place]); Anterolisthesis; Cervical facet syndrome ([place]); Chronic musculoskeletal pain; Muscle spasm of back; Encounter for screening colonoscopy; Rectal bleeding; Mixed simple and mucopurulent chronic bronchitis ([place]);  Nausea; Vasovagal episode; Myofascial pain; Chronic hip pain (Secondary source of pain) (Bilateral) (R>L); 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 Ms. Chanda was last seen on 05/13/2018. Her primarily concern today is the No chief complaint on file.  Pain Assessment: Location:     Radiating:   Onset:   Duration:   Quality:   Severity:  /10 (subjective, self-reported pain score)  Note: Reported level is compatible with observation.                         When using our objective Pain Scale, levels between 6 and 10/10 are said to belong in an emergency room, as it progressively worsens from a 6/10, described as severely limiting, requiring emergency care not usually available at an outpatient pain management facility. At a 6/10 level, communication becomes difficult and requires great effort. Assistance to reach the emergency department may be required. Facial flushing and profuse sweating along with potentially dangerous increases in heart rate and blood pressure will be evident. Effect on ADL:   Timing:   Modifying factors:   BP:    HR:    Further details on both, my assessment(s), as well as the proposed treatment plan, please see below.  Laboratory Chemistry  Inflammation Markers (CRP: Acute Phase) (ESR: Chronic Phase) Lab Results  Component Value Date   CRP 0.9 01/08/2016   ESRSEDRATE 37 (H) 01/08/2016  Rheumatology Markers No  results found.  Renal Function Markers Lab Results  Component Value Date   BUN 8 01/08/2016   CREATININE 0.94 01/08/2016   GFRAA >60 01/08/2016   GFRNONAA >60 01/08/2016                             Hepatic Function Markers Lab Results  Component Value Date   AST 18 01/08/2016   ALT 14 01/08/2016   ALBUMIN 3.7 01/08/2016   ALKPHOS 79 01/08/2016                        Electrolytes Lab Results  Component Value Date   NA 137 01/08/2016   K 3.5 01/08/2016   CL 101 01/08/2016   CALCIUM 9.0 01/08/2016   MG 2.1 01/08/2016                        Neuropathy Markers Lab Results  Component Value Date   VITAMINB12 >7,500 (H) 01/08/2016                        CNS Tests No results found.  Bone Pathology Markers Lab Results  Component Value Date   25OHVITD1 32 01/08/2016   25OHVITD2 2.0 01/08/2016   25OHVITD3 30 01/08/2016                         Coagulation Parameters Lab Results  Component Value Date   PLT 295 05/13/2018                        Cardiovascular Markers Lab Results  Component Value Date   HGB 13.9 05/13/2018   HCT 41.8 05/13/2018                         CA Markers No results found.  Note: Lab results reviewed.  Imaging Review  Cervical Imaging: Cervical DG complete:  Results for orders placed during the hospital encounter of 09/19/16  DG Cervical Spine Complete   Narrative CLINICAL DATA:  Chronic neck pain.  Bilateral shoulder pain.  EXAM: CERVICAL SPINE - COMPLETE 4+ VIEW  COMPARISON:  None.  FINDINGS: C3-C6 ACDF. Unexpectedly prominent subsidence of the C5-6 intervertebral cage into the C6 body, nearly reaching the C6 screws. Status of bony fusion is uncertain. No notable adjacent segment degenerative change. No fracture deformity or suspicious prevertebral thickening.  IMPRESSION: 1. C3-C6 ACDF. Prominent subsidence of the C5-6 cage into the C6 body. 2. No notable adjacent segment degeneration.   Electronically Signed   By:  Monte Fantasia M.D.   On: 09/19/2016 13:06    Shoulder Imaging: Shoulder-R DG:  Results for orders placed during the hospital encounter of 01/08/16  DG Shoulder Right   Narrative CLINICAL DATA:  Chronic shoulder pain.  EXAM: RIGHT SHOULDER - 2+ VIEW  COMPARISON:  Chest x-ray 02/08/2011.  FINDINGS: Right shoulder replacement. Hardware intact. Good anatomic alignment. Degenerative changes noted about the right shoulder. Sclerotic focus is noted in the right glenoid. This could be related to degenerative change and/or prior surgery, however a small blastic lesion cannot be excluded. Diffuse osteopenia. No acute bony abnormality identified. Prior cervical spine fusion.  IMPRESSION: 1. Right shoulder replacement good anatomic alignment. Degenerative changes right shoulder. Diffuse osteopenia.  2. Small sclerotic focus in the right glenoid.  This may be degenerative and/or postsurgical. A small blastic lesion cannot be completely excluded .   Electronically Signed   By: Marcello Moores  Register   On: 01/09/2016 08:14    Shoulder-L DG:  Results for orders placed during the hospital encounter of 01/08/16  DG Shoulder Left   Narrative CLINICAL DATA:  Chronic left shoulder pain with limited range of motion, previous left shoulder joint replacement  EXAM: LEFT SHOULDER - 2+ VIEW  COMPARISON:  MRI of the left shoulder of May 14, 2012  FINDINGS: There is a prosthetic humeral head. The remaining native bone of the humerus there is normal where visualized. The bony glenoid appears intact. The [facility] joint is grossly normal. There is a subacromial spur. The observed portions of the left clavicle and upper left ribs are normal.  IMPRESSION: No acute abnormality of the prosthetic left glenohumeral joint. There is no evidence of loosening or infection involving the humeral component. There is a subacromial spur.   Electronically Signed   By: David  Martinique M.D.   On: 01/09/2016  07:57    Lumbosacral Imaging: Lumbar DG Bending views:  Results for orders placed during the hospital encounter of 07/27/16  DG Lumbar Spine Complete W/Bend   Narrative CLINICAL DATA:  Chronic back pain. Prior surgery. Fall in late 2017.  EXAM: LUMBAR SPINE - COMPLETE WITH BENDING VIEWS  COMPARISON:  Lumbar spine MRI of 08/14/2011.  FINDINGS: Five lumbar type vertebral bodies. Sacroiliac joints are symmetric. Probable phleboliths in the pelvis. Maintenance of vertebral body height. Grade 1 L4-5 anterolisthesis is similar to on the prior MRI. With flexion, limited range of motion but no evidence of instability. With extension, moderate range of motion and no evidence of instability.  Degenerative disc disease involves L4-5 and L5-S1. Facet arthropathy at these levels.  IMPRESSION: Lumbar spondylosis with trace L4-5 anterolisthesis. No acute superimposed process.   Electronically Signed   By: Abigail Miyamoto M.D.   On: 07/27/2016 15:46    Sacroiliac Joint Imaging: Sacroiliac Joint DG:  Results for orders placed during the hospital encounter of 01/08/16  DG Si Joints   Narrative CLINICAL DATA:  Bilateral hip pain. No known injury. Initial evaluation  EXAM: BILATERAL SACROILIAC JOINTS - 3+ VIEW  COMPARISON:  MRI 08/14/2011.Lumbar spine series 05/28/2010.  FINDINGS: Degenerative changes lumbar spine, both SI joints, both hips. No acute abnormality identified. No evidence of erosive arthropathy. Pelvic calcifications consistent phleboliths.  IMPRESSION: Degenerative changes lumbar spine, both SI joints, and both hips. No evidence of erosive arthropathy. No acute bony abnormality.   Electronically Signed   By: Marcello Moores  Register   On: 01/09/2016 07:58    Hip Imaging: Hip-R DG 2-3 views:  Results for orders placed during the hospital encounter of 05/18/18  DG HIP UNILAT W OR W/O PELVIS 2-3 VIEWS RIGHT   Narrative CLINICAL DATA:  Chronic bilateral hip pain. History  of osteoarthritis  EXAM: DG HIP (WITH OR WITHOUT PELVIS) 2-3V LEFT; DG HIP (WITH OR WITHOUT PELVIS) 2-3V RIGHT  COMPARISON:  Bilateral hip series of January 08, 2016.  FINDINGS: The bony pelvis is subjectively osteopenic. There is no lytic nor blastic lesion. AP and lateral views of both hips reveal preservation of the joint spaces. The articular surfaces of the femoral heads and acetabuli E remains smoothly rounded. The femoral necks, intertrochanteric, and subtrochanteric regions are normal. There is bursal calcification adjacent to the greater trochanter of the left femur. No erosive changes are observed. There is no acute or healing fracture.  IMPRESSION:  There is no acute bony abnormality of either hip nor evidence of significant arthropathic change. There is bursal calcification adjacent to the greater trochanter on the left.   Electronically Signed   By: David  Martinique M.D.   On: 05/18/2018 16:40    Hip-L DG 2-3 views:  Results for orders placed during the hospital encounter of 05/18/18  DG HIP UNILAT W OR W/O PELVIS 2-3 VIEWS LEFT   Narrative CLINICAL DATA:  Chronic bilateral hip pain. History of osteoarthritis  EXAM: DG HIP (WITH OR WITHOUT PELVIS) 2-3V LEFT; DG HIP (WITH OR WITHOUT PELVIS) 2-3V RIGHT  COMPARISON:  Bilateral hip series of January 08, 2016.  FINDINGS: The bony pelvis is subjectively osteopenic. There is no lytic nor blastic lesion. AP and lateral views of both hips reveal preservation of the joint spaces. The articular surfaces of the femoral heads and acetabuli E remains smoothly rounded. The femoral necks, intertrochanteric, and subtrochanteric regions are normal. There is bursal calcification adjacent to the greater trochanter of the left femur. No erosive changes are observed. There is no acute or healing fracture.  IMPRESSION: There is no acute bony abnormality of either hip nor evidence of significant arthropathic change. There is bursal  calcification adjacent to the greater trochanter on the left.   Electronically Signed   By: David  Martinique M.D.   On: 05/18/2018 16:40    Complexity Note: Imaging results reviewed. Results shared with Ms. Domingo Cocking, using Layman's terms.                         Meds   Current Outpatient Medications:  .  albuterol (PROVENTIL HFA;VENTOLIN HFA) 108 (90 Base) MCG/ACT inhaler, Inhale 2 puffs into the lungs every 4 (four) hours as needed for wheezing or shortness of breath. , Disp: , Rfl:  .  cetirizine (ZYRTEC) 10 MG tablet, Take 10 mg by mouth daily. , Disp: , Rfl:  .  cholecalciferol (VITAMIN D) 1000 units tablet, Take 1,000 Units by mouth daily., Disp: , Rfl:  .  citalopram (CELEXA) 20 MG tablet, Take 20 mg by mouth daily., Disp: , Rfl: 3 .  diclofenac sodium (VOLTAREN) 1 % GEL, Apply 2 g topically 4 (four) times daily., Disp: 100 g, Rfl: 2 .  docusate sodium (COLACE) 100 MG capsule, Take 100 mg by mouth 2 (two) times daily., Disp: , Rfl:  .  levothyroxine (SYNTHROID, LEVOTHROID) 50 MCG tablet, TAKE 1 TABLET (50 MCG TOTAL) BY MOUTH DAILY AT 0600., Disp: , Rfl: 11 .  magnesium hydroxide (MILK OF MAGNESIA) 400 MG/5ML suspension, Take by mouth., Disp: , Rfl:  .  naloxone (NARCAN) 2 MG/2ML injection, Inject content of syringe into thigh muscle. Call 911., Disp: 2 Syringe, Rfl: 1 .  nystatin (MYCOSTATIN/NYSTOP) powder, Apply topically., Disp: , Rfl:  .  [START ON 06/22/2018] Oxycodone HCl 10 MG TABS, Take 1 tablet (10 mg total) by mouth every 6 (six) hours as needed., Disp: 120 tablet, Rfl: 0 .  Oxycodone HCl 10 MG TABS, Take 1 tablet (10 mg total) by mouth every 6 (six) hours as needed., Disp: 120 tablet, Rfl: 0 .  Oxycodone HCl 10 MG TABS, Take 1 tablet (10 mg total) by mouth every 6 (six) hours as needed., Disp: 120 tablet, Rfl: 0 .  SPIRIVA HANDIHALER 18 MCG inhalation capsule, PLACE 1 CAPSULE (18 MCG TOTAL) INTO INHALER AND INHALE ONCE DAILY., Disp: , Rfl: 5 .  tiZANidine (ZANAFLEX) 4 MG  tablet, Take 1 tablet (4 mg  total) by mouth every 8 (eight) hours as needed for muscle spasms., Disp: 90 tablet, Rfl: 5  ROS  Constitutional: Denies any fever or chills Gastrointestinal: No reported hemesis, hematochezia, vomiting, or acute GI distress Musculoskeletal: Denies any acute onset joint swelling, redness, loss of ROM, or weakness Neurological: No reported episodes of acute onset apraxia, aphasia, dysarthria, agnosia, amnesia, paralysis, loss of coordination, or loss of consciousness  Allergies  Ms. Fons is allergic to aripiprazole; duloxetine; gabapentin; gold; iodinated diagnostic agents; naproxen sodium; nortriptyline hcl; nsaids; pregabalin; meperidine; ace inhibitors; aspirin; cefpodoxime; fluoxetine; fluticasone-salmeterol; levofloxacin; lithium; paroxetine; tape; telithromycin; theophylline; topiramate; trazodone; triamcinolone; venlafaxine; bupropion; ketorolac tromethamine; morphine; and moxifloxacin.  [place]  Drug: Ms. Schleifer  reports that she does not use drugs. Alcohol:  reports that she does not drink alcohol. Tobacco:  reports that she has been smoking cigarettes. She has a 40.00 pack-year smoking history. She has never used smokeless tobacco. Medical:  has a past medical history of Acute postoperative pain (01/13/2017), Anxiety, Arthritis, Asthma, Bell's palsy, Bursitis, COPD (chronic obstructive pulmonary disease) ([place]), Depression, Fibromyalgia, Heart murmur, Hepatitis C, Hiatal hernia, Hyperlipidemia, Hypertension, IBS (irritable bowel syndrome), Insomnia, and Thyroid disease. Surgical: Ms. Hindley  has a past surgical history that includes necksurgery (10/21/2014); Back surgery (2013); Foot surgery (Right); Elbow surgery (Left); Carpal tunnel release (Right); Nose surgery; Partial hysterectomy; Total shoulder replacement (Bilateral); and Colonoscopy with propofol (N/A, 02/05/2017). Family: family history includes Cancer in her mother; Gout in her mother; Heart disease in  her father.  Constitutional Exam  General appearance: Well nourished, well developed, and well hydrated. In no apparent acute distress There were no vitals filed for this visit. BMI Assessment: Estimated body mass index is 40.44 kg/m as calculated from the following:   Height as of 05/13/18: _0  (1.651 m).   Weight as of 05/13/18: 243 lb (110.2 kg).  BMI interpretation table: BMI level Category Range association with higher incidence of chronic pain  <18 kg/m2 Underweight   18.5-24.9 kg/m2 Ideal body weight   25-29.9 kg/m2 Overweight Increased incidence by 20%  30-34.9 kg/m2 Obese (Class I) Increased incidence by 68%  35-39.9 kg/m2 Severe obesity (Class II) Increased incidence by 136%  >40 kg/m2 Extreme obesity (Class III) Increased incidence by 254%   Patient's current BMI Ideal Body weight  There is no height or weight on file to calculate BMI. Patient weight not recorded   BMI Readings from Last 4 Encounters:  05/13/18 40.44 kg/m  04/30/18 40.27 kg/m  04/21/18 40.27 kg/m  03/25/18 40.70 kg/m   Wt Readings from Last 4 Encounters:  05/13/18 243 lb (110.2 kg)  04/30/18 242 lb (109.8 kg)  04/21/18 242 lb (109.8 kg)  03/25/18 244 lb 9.6 oz (110.9 kg)  Psych/Mental status: Alert, oriented x 3 (person, place, & time)       Eyes: PERLA Respiratory: No evidence of acute respiratory distress  Cervical Spine Area Exam  Skin & Axial Inspection: No masses, redness, edema, swelling, or associated skin lesions Alignment: Symmetrical Functional ROM: Unrestricted ROM      Stability: No instability detected Muscle Tone/Strength: Functionally intact. No obvious neuro-muscular anomalies detected. Sensory (Neurological): Unimpaired Palpation: No palpable anomalies              Upper Extremity (UE) Exam    Side: Right upper extremity  Side: Left upper extremity  Skin & Extremity Inspection: Skin color, temperature, and hair growth are WNL. No peripheral edema or cyanosis. No  masses, redness, swelling, asymmetry, or associated skin lesions.  No contractures.  Skin & Extremity Inspection: Skin color, temperature, and hair growth are WNL. No peripheral edema or cyanosis. No masses, redness, swelling, asymmetry, or associated skin lesions. No contractures.  Functional ROM: Unrestricted ROM          Functional ROM: Unrestricted ROM          Muscle Tone/Strength: Functionally intact. No obvious neuro-muscular anomalies detected.  Muscle Tone/Strength: Functionally intact. No obvious neuro-muscular anomalies detected.  Sensory (Neurological): Unimpaired          Sensory (Neurological): Unimpaired          Palpation: No palpable anomalies              Palpation: No palpable anomalies              Provocative Test(s):  Phalen's test: deferred Tinel's test: deferred Apley's scratch test (touch opposite shoulder):  Action 1 (Across chest): deferred Action 2 (Overhead): deferred Action 3 (LB reach): deferred   Provocative Test(s):  Phalen's test: deferred Tinel's test: deferred Apley's scratch test (touch opposite shoulder):  Action 1 (Across chest): deferred Action 2 (Overhead): deferred Action 3 (LB reach): deferred    Thoracic Spine Area Exam  Skin & Axial Inspection: No masses, redness, or swelling Alignment: Symmetrical Functional ROM: Unrestricted ROM Stability: No instability detected Muscle Tone/Strength: Functionally intact. No obvious neuro-muscular anomalies detected. Sensory (Neurological): Unimpaired Muscle strength & Tone: No palpable anomalies  Lumbar Spine Area Exam  Skin & Axial Inspection: No masses, redness, or swelling Alignment: Symmetrical Functional ROM: Unrestricted ROM       Stability: No instability detected Muscle Tone/Strength: Functionally intact. No obvious neuro-muscular anomalies detected. Sensory (Neurological): Unimpaired Palpation: No palpable anomalies       Provocative Tests: Hyperextension/rotation test: deferred today        Lumbar quadrant test (Kemp's test): deferred today       Lateral bending test: deferred today       Patrick's Maneuver: deferred today                   FABER test: deferred today                   S-I anterior distraction/compression test: deferred today         S-I lateral compression test: deferred today         S-I Thigh-thrust test: deferred today         S-I Gaenslen's test: deferred today          Gait & Posture Assessment  Ambulation: Unassisted Gait: Relatively normal for age and body habitus Posture: WNL   Lower Extremity Exam    Side: Right lower extremity  Side: Left lower extremity  Stability: No instability observed          Stability: No instability observed          Skin & Extremity Inspection: Skin color, temperature, and hair growth are WNL. No peripheral edema or cyanosis. No masses, redness, swelling, asymmetry, or associated skin lesions. No contractures.  Skin & Extremity Inspection: Skin color, temperature, and hair growth are WNL. No peripheral edema or cyanosis. No masses, redness, swelling, asymmetry, or associated skin lesions. No contractures.  Functional ROM: Unrestricted ROM                  Functional ROM: Unrestricted ROM                  Muscle Tone/Strength: Functionally intact. No obvious  neuro-muscular anomalies detected.  Muscle Tone/Strength: Functionally intact. No obvious neuro-muscular anomalies detected.  Sensory (Neurological): Unimpaired medial portion of foot (L4)  Sensory (Neurological): Unimpaired medial portion of foot (L4)  DTR: Patellar: deferred today Achilles: deferred today Plantar: deferred today  DTR: Patellar: deferred today Achilles: deferred today Plantar: deferred today  Palpation: No palpable anomalies  Palpation: No palpable anomalies   Assessment  Primary Diagnosis & Pertinent Problem List: The primary encounter diagnosis was Chronic shoulder pain (Primary Source of Pain) (Bilateral) (R>L). Diagnoses of Chronic hip pain  (Secondary source of pain) (Bilateral) (R>L), Chronic low back pain [facility] source of pain) (Bilateral) (R>L), Cervical spinal cord compression ([place]) (April, 2017), Cervical spinal stenosis, Failed back surgical syndrome, Opiate use (60 MME/Day), Chronic pain syndrome, Pharmacologic therapy, Disorder of skeletal system, Problems influencing health status, Chronic trochanteric bursitis (Bilateral), Elevated sed rate, and Elevated vitamin B12 level were also pertinent to this visit.  Status Diagnosis  Controlled Controlled Controlled 1. Chronic shoulder pain (Primary Source of Pain) (Bilateral) (R>L)   2. Chronic hip pain (Secondary source of pain) (Bilateral) (R>L)   3. Chronic low back pain [facility] source of pain) (Bilateral) (R>L)   4. Cervical spinal cord compression ([place]) (April, 2017)   5. Cervical spinal stenosis   6. Failed back surgical syndrome   7. Opiate use (60 MME/Day)   8. Chronic pain syndrome   9. Pharmacologic therapy   10. Disorder of skeletal system   11. Problems influencing health status   12. Chronic trochanteric bursitis (Bilateral)   13. Elevated sed rate   14. Elevated vitamin B12 level     Problems updated and reviewed during this visit: Problem  Chronic trochanteric bursitis (Bilateral)  Pharmacologic Therapy  Disorder of Skeletal System  Problems Influencing Health Status  Elevated Sed Rate  Elevated Vitamin B12 Level   Plan of Care  Pharmacotherapy (Medications Ordered): No orders of the defined types were placed in this encounter.  Medications administered today: Kiarah A. Domingo Cocking "ANGIE" had no medications administered during this visit.  Procedure Orders    No procedure(s) ordered today   Lab Orders  No laboratory test(s) ordered today   Imaging Orders  No imaging studies ordered today   Referral Orders  No referral(s) requested today   Interventional management options: Planned, scheduled, and/or pending:   Diagnostic bilateral  trochanteric bursa injection #1 under fluoroscopic guidance   Considering:   Palliative bilateralsuprascapular nerve block  Possible bilateral suprascapular nerve RFA Diagnostic bilateral intra-articular hip joint injection  Possible bilateral hip joint RFA  Diagnostic bilateral lumbar facet block  Possible bilaterallumbar facet RFA  Diagnostic caudal epiduralsteroid injection + diagnostic epidurogram  Possible Racz procedure.  Diagnostic bilateral cervical facet block  Possible bilateral cervical facet RFA   Diagnostic right-sided cervical epidural steroid injection  Diagnostic bilateral sacroiliac joint block  Possible bilateral sacroiliac joint RFA    Palliative PRN treatment(s):   Palliative bilateralsuprascapular nerve block  Diagnostic bilateral intra-articular hip joint injection  Diagnostic bilateral lumbar facet block  Palliative right-sided lumbar facet RFA #3 (last one done on 12/16/2017) Palliative left-sided lumbar facet RFA #3 (last one done on 02/12/2018) Diagnostic caudal epiduralsteroid injection + diagnostic epidurogram  Diagnostic bilateral cervical facet block  Diagnostic right-sided cervical epidural steroid injection  Diagnostic bilateral sacroiliac joint block    Provider-requested follow-up: No follow-ups on file.  Future Appointments  Date Time Provider [place]  06/03/2018 11:30 AM Milinda Pointer, MD [facility] None  06/24/2018  3:00 PM [facility]  [facility] None  06/24/2018  4:00 PM Kris Hartmann, NP [facility] None  07/23/2018 11:15 AM Vevelyn Francois, NP [facility] None   Primary Care Physician: Marygrace Drought, MD Location: [facility] Note by: Gaspar Cola, MD Date: 06/03/2018; Time: 7:22 AM

## 2018-06-03 ENCOUNTER — Ambulatory Visit: Payer: Medicaid Other | Admitting: Pain Medicine

## 2018-06-03 DIAGNOSIS — M7061 Trochanteric bursitis, right hip: Secondary | ICD-10-CM | POA: Insufficient documentation

## 2018-06-03 DIAGNOSIS — R748 Abnormal levels of other serum enzymes: Secondary | ICD-10-CM | POA: Insufficient documentation

## 2018-06-03 DIAGNOSIS — M7062 Trochanteric bursitis, left hip: Secondary | ICD-10-CM

## 2018-06-03 DIAGNOSIS — Z789 Other specified health status: Secondary | ICD-10-CM | POA: Insufficient documentation

## 2018-06-03 DIAGNOSIS — R7989 Other specified abnormal findings of blood chemistry: Secondary | ICD-10-CM | POA: Insufficient documentation

## 2018-06-03 DIAGNOSIS — M899 Disorder of bone, unspecified: Secondary | ICD-10-CM | POA: Insufficient documentation

## 2018-06-03 DIAGNOSIS — Z79899 Other long term (current) drug therapy: Secondary | ICD-10-CM | POA: Insufficient documentation

## 2018-06-03 DIAGNOSIS — R700 Elevated erythrocyte sedimentation rate: Secondary | ICD-10-CM | POA: Insufficient documentation

## 2018-06-05 DIAGNOSIS — M160 Bilateral primary osteoarthritis of hip: Secondary | ICD-10-CM | POA: Insufficient documentation

## 2018-06-09 NOTE — Telephone Encounter (Signed)
error 

## 2018-06-14 NOTE — Progress Notes (Signed)
Patient's Name: Danielle [name]  MRN: [id number]  Referring Provider: Marygrace Drought, MD  DOB: 01/28/1961  PCP: Danielle Drought, MD  DOS: 06/15/2018  Note by: Danielle Cola, MD  Service setting: Ambulatory outpatient  Specialty: Interventional Pain Management  Location: [facility] (AMB) Pain Management Facility    Patient type: Established   Primary Reason(s) for Visit: Evaluation of chronic illnesses with exacerbation, or progression (Level of risk: moderate) CC: Follow-up  HPI  Danielle [name] is a 57 y.o. year old, female patient, who comes today for a follow-up evaluation. She has Long term prescription opiate use; Opiate use (60 MME/Day); B12 deficiency; Cervical spinal cord compression [facility]) (April, 2017); Cervical spinal stenosis; Chronic obstructive pulmonary disease ([place]); Clinical depression; Adult hypothyroidism; Adiposity; Mucositis oral; Groin pain; Current tobacco use; Chronic shoulder pain (Primary Source of Pain) (Bilateral) (R>L); History of total shoulder replacement (Bilateral); Chronic low back pain [facility] source of pain) (Bilateral) (R>L); Lumbar facet syndrome (Bilateral) (R>L); Lumbar spondylosis; Failed back surgical syndrome; Chronic lower extremity pain (Right); Chronic neck pain (Bilateral) (R>L); Chronic cervical radicular pain (Right); Ulnar neuropathy (Left); Hx of cervical spine surgery; Cervical spondylosis; Encounter for therapeutic drug level monitoring; Encounter for pain management planning; Fibromyalgia; Chronic sacroiliac pain (Bilateral) (R>L); Seasonal allergic rhinitis; S/P total replacement of shoulder (Right); Hypertension; Chronic pain syndrome; Complaints of weakness of lower extremity; Morbid obesity with BMI of 40.0-44.9, adult ([place]); Anterolisthesis; Cervical facet syndrome ([place]); Chronic musculoskeletal pain; Muscle spasm of back; Encounter for screening colonoscopy; Rectal bleeding; Mixed simple and mucopurulent chronic bronchitis ([place]); Nausea; Vasovagal  episode; Myofascial pain; Chronic hip pain (Secondary source of pain) (Bilateral) (R>L); 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 Danielle [name] was last seen on 05/13/2018. Her primarily concern today is the Follow-up  Pain Assessment: Location: Right, Left(right hip is worst ) Hip Radiating: Radiates down to groin area  Onset: More than a month ago Duration: Chronic pain Quality: Constant, Sharp Severity: 3 /10 (subjective, self-reported pain score)  Note: Reported level is compatible with observation.                         When using our objective Pain Scale, levels between 6 and 10/10 are said to belong in an emergency room, as it progressively worsens from a 6/10, described as severely limiting, requiring emergency care not usually available at an outpatient pain management facility. At a 6/10 level, communication becomes difficult and requires great effort. Assistance to reach the emergency department may be required. Facial flushing and profuse sweating along with potentially dangerous increases in heart rate and blood pressure will be evident. Effect on ADL: "It slows me down but have to do what I have to do"  Timing: Constant Modifying factors: heating pad BP: (!) 142/69  HR: 78   The patient returns to the clinic today for follow-up on CBC and hip x-rays.  The CBC does reveal an elevated white blood cell count with a shift, confirming that the patient still  had some cellulitis.  This was ordered for the benefit of the patient's primary care physician  so asked to be able to decide whether or not she needed some further antibiotics.  In terms of the x-rays, they have revealed that the patient has bilateral chronic trochanteric bursitis as documented by calcifications around the area of the trochanteric bursa, bilaterally.  Further details on both, my assessment(s), as well as the proposed treatment plan, please see below.  Laboratory Chemistry  Inflammation Markers (CRP: Acute Phase) (ESR: Chronic Phase) Lab Results  Component Value Date   CRP 0.9 01/08/2016   ESRSEDRATE 37 (H) 01/08/2016                         Rheumatology Markers No results found.  Renal Function Markers Lab Results  Component Value Date   BUN 8 01/08/2016   CREATININE 0.94 01/08/2016   GFRAA >60 01/08/2016   GFRNONAA >60 01/08/2016                             Hepatic Function Markers Lab Results  Component Value Date   AST 18 01/08/2016   ALT 14 01/08/2016   ALBUMIN 3.7 01/08/2016   ALKPHOS 79 01/08/2016                        Electrolytes Lab Results  Component Value Date   NA 137 01/08/2016   K 3.5 01/08/2016   CL 101 01/08/2016   CALCIUM 9.0 01/08/2016   MG 2.1 01/08/2016                        Neuropathy Markers Lab Results  Component Value Date   VITAMINB12 >7,500 (H) 01/08/2016                        CNS Tests No results found.  Bone Pathology Markers Lab Results  Component Value Date   25OHVITD1 32 01/08/2016   25OHVITD2 2.0 01/08/2016   25OHVITD3 30 01/08/2016                         Coagulation Parameters Lab Results  Component Value Date   PLT 295 05/13/2018                        Cardiovascular Markers Lab Results  Component Value Date   HGB 13.9 05/13/2018   HCT 41.8 05/13/2018                         CA Markers No results found.  Note: Lab results reviewed.  Imaging Review  Cervical Imaging: Cervical DG  complete:  Results for orders placed during the hospital encounter of 09/19/16  DG Cervical Spine Complete   Narrative CLINICAL DATA:  Chronic neck pain.  Bilateral shoulder pain.  EXAM: CERVICAL SPINE - COMPLETE 4+ VIEW  COMPARISON:  None.  FINDINGS: C3-C6 ACDF. Unexpectedly prominent subsidence of the C5-6 intervertebral cage into the C6 body, nearly reaching the C6 screws. Status of bony fusion is uncertain. No notable adjacent segment degenerative change. No fracture deformity or suspicious prevertebral thickening.  IMPRESSION: 1. C3-C6 ACDF. Prominent subsidence of the C5-6 cage into the C6 body. 2. No notable adjacent segment degeneration.   Electronically Signed   By: Monte Fantasia M.D.   On: 09/19/2016 13:06    Shoulder Imaging: Shoulder-R DG:  Results  for orders placed during the hospital encounter of 01/08/16  DG Shoulder Right   Narrative CLINICAL DATA:  Chronic shoulder pain.  EXAM: RIGHT SHOULDER - 2+ VIEW  COMPARISON:  Chest x-ray 02/08/2011.  FINDINGS: Right shoulder replacement. Hardware intact. Good anatomic alignment. Degenerative changes noted about the right shoulder. Sclerotic focus is noted in the right glenoid. This could be related to degenerative change and/or prior surgery, however a small blastic lesion cannot be excluded. Diffuse osteopenia. No acute bony abnormality identified. Prior cervical spine fusion.  IMPRESSION: 1. Right shoulder replacement good anatomic alignment. Degenerative changes right shoulder. Diffuse osteopenia.  2. Small sclerotic focus in the right glenoid. This may be degenerative and/or postsurgical. A small blastic lesion cannot be completely excluded .   Electronically Signed   By: Marcello Moores  Register   On: 01/09/2016 08:14    Shoulder-L DG:  Results for orders placed during the hospital encounter of 01/08/16  DG Shoulder Left   Narrative CLINICAL DATA:  Chronic left shoulder pain with limited range  of motion, previous left shoulder joint replacement  EXAM: LEFT SHOULDER - 2+ VIEW  COMPARISON:  MRI of the left shoulder of May 14, 2012  FINDINGS: There is a prosthetic humeral head. The remaining native bone of the humerus there is normal where visualized. The bony glenoid appears intact. [facility] joint is grossly normal. There is a subacromial spur. The observed portions of the left clavicle and upper left ribs are normal.  IMPRESSION: No acute abnormality of the prosthetic left glenohumeral joint. There is no evidence of loosening or infection involving the humeral component. There is a subacromial spur.   Electronically Signed   By: David  Martinique M.D.   On: 01/09/2016 07:57    Lumbosacral Imaging: Lumbar DG Bending views:  Results for orders placed during the hospital encounter of 07/27/16  DG Lumbar Spine Complete W/Bend   Narrative CLINICAL DATA:  Chronic back pain. Prior surgery. Fall in late 2017.  EXAM: LUMBAR SPINE - COMPLETE WITH BENDING VIEWS  COMPARISON:  Lumbar spine MRI of 08/14/2011.  FINDINGS: Five lumbar type vertebral bodies. Sacroiliac joints are symmetric. Probable phleboliths in the pelvis. Maintenance of vertebral body height. Grade 1 L4-5 anterolisthesis is similar to on the prior MRI. With flexion, limited range of motion but no evidence of instability. With extension, moderate range of motion and no evidence of instability.  Degenerative disc disease involves L4-5 and L5-S1. Facet arthropathy at these levels.  IMPRESSION: Lumbar spondylosis with trace L4-5 anterolisthesis. No acute superimposed process.   Electronically Signed   By: Abigail Miyamoto M.D.   On: 07/27/2016 15:46    Sacroiliac Joint Imaging: Sacroiliac Joint DG:  Results for orders placed during the hospital encounter of 01/08/16  DG Si Joints   Narrative CLINICAL DATA:  Bilateral hip pain. No known injury. Initial evaluation  EXAM: BILATERAL SACROILIAC JOINTS -  3+ VIEW  COMPARISON:  MRI 08/14/2011.Lumbar spine series 05/28/2010.  FINDINGS: Degenerative changes lumbar spine, both SI joints, both hips. No acute abnormality identified. No evidence of erosive arthropathy. Pelvic calcifications consistent phleboliths.  IMPRESSION: Degenerative changes lumbar spine, both SI joints, and both hips. No evidence of erosive arthropathy. No acute bony abnormality.   Electronically Signed   By: Marcello Moores  Register   On: 01/09/2016 07:58    Hip Imaging: Hip-R DG 2-3 views:  Results for orders placed during the hospital encounter of 05/18/18  DG HIP UNILAT W OR W/O PELVIS 2-3 VIEWS RIGHT   Narrative CLINICAL DATA:  Chronic bilateral hip pain. History of osteoarthritis  EXAM: DG HIP (WITH OR WITHOUT PELVIS) 2-3V LEFT; DG HIP (WITH OR WITHOUT PELVIS) 2-3V RIGHT  COMPARISON:  Bilateral hip series of January 08, 2016.  FINDINGS: The bony pelvis is subjectively osteopenic. There is no lytic nor blastic lesion. AP and lateral views of both hips reveal preservation of the joint spaces. The articular surfaces of the femoral heads and acetabuli E remains smoothly rounded. The femoral necks, intertrochanteric, and subtrochanteric regions are normal. There is bursal calcification adjacent to the greater trochanter of the left femur. No erosive changes are observed. There is no acute or healing fracture.  IMPRESSION: There is no acute bony abnormality of either hip nor evidence of significant arthropathic change. There is bursal calcification adjacent to the greater trochanter on the left.   Electronically Signed   By: David  Martinique M.D.   On: 05/18/2018 16:40    Hip-L DG 2-3 views:  Results for orders placed during the hospital encounter of 05/18/18  DG HIP UNILAT W OR W/O PELVIS 2-3 VIEWS LEFT   Narrative CLINICAL DATA:  Chronic bilateral hip pain. History of osteoarthritis  EXAM: DG HIP (WITH OR WITHOUT PELVIS) 2-3V LEFT; DG HIP (WITH OR  WITHOUT PELVIS) 2-3V RIGHT  COMPARISON:  Bilateral hip series of January 08, 2016.  FINDINGS: The bony pelvis is subjectively osteopenic. There is no lytic nor blastic lesion. AP and lateral views of both hips reveal preservation of the joint spaces. The articular surfaces of the femoral heads and acetabuli E remains smoothly rounded. The femoral necks, intertrochanteric, and subtrochanteric regions are normal. There is bursal calcification adjacent to the greater trochanter of the left femur. No erosive changes are observed. There is no acute or healing fracture.  IMPRESSION: There is no acute bony abnormality of either hip nor evidence of significant arthropathic change. There is bursal calcification adjacent to the greater trochanter on the left.   Electronically Signed   By: David  Martinique M.D.   On: 05/18/2018 16:40    Complexity Note: Imaging results reviewed. Results shared with Ms. Domingo Cocking, using Layman's terms.                         Meds   Current Outpatient Medications:  .  albuterol (PROVENTIL HFA;VENTOLIN HFA) 108 (90 Base) MCG/ACT inhaler, Inhale 2 puffs into the lungs every 4 (four) hours as needed for wheezing or shortness of breath. , Disp: , Rfl:  .  cholecalciferol (VITAMIN D) 1000 units tablet, Take 1,000 Units by mouth daily., Disp: , Rfl:  .  citalopram (CELEXA) 20 MG tablet, Take 20 mg by mouth daily., Disp: , Rfl: 3 .  diclofenac sodium (VOLTAREN) 1 % GEL, Apply 2 g topically 4 (four) times daily., Disp: 100 g, Rfl: 2 .  docusate sodium (COLACE) 100 MG capsule, Take 100 mg by mouth 2 (two) times daily., Disp: , Rfl:  .  fexofenadine (ALLEGRA) 180 MG tablet, Take 180 mg by mouth daily., Disp: , Rfl: 3 .  levothyroxine (SYNTHROID, LEVOTHROID) 50 MCG tablet, TAKE 1 TABLET (50 MCG TOTAL) BY MOUTH DAILY AT 0600., Disp: , Rfl: 11 .  magnesium hydroxide (MILK OF MAGNESIA) 400 MG/5ML suspension, Take by mouth., Disp: , Rfl:  .  Misc. Devices (QUAD CANE) MISC, Use  as directed, Disp: , Rfl:  .  naloxone (NARCAN) 2 MG/2ML injection, Inject content of syringe into thigh muscle. Call 911., Disp: 2 Syringe, Rfl: 1 .  Nebulizers (  VIOS LC SPRINT DELUXE) MISC, by Does not apply route., Disp: , Rfl:  .  nystatin (MYCOSTATIN/NYSTOP) powder, Apply topically., Disp: , Rfl:  .  [START ON 06/22/2018] Oxycodone HCl 10 MG TABS, Take 1 tablet (10 mg total) by mouth every 6 (six) hours as needed., Disp: 120 tablet, Rfl: 0 .  Oxycodone HCl 10 MG TABS, Take 1 tablet (10 mg total) by mouth every 6 (six) hours as needed., Disp: 120 tablet, Rfl: 0 .  SPIRIVA HANDIHALER 18 MCG inhalation capsule, PLACE 1 CAPSULE (18 MCG TOTAL) INTO INHALER AND INHALE ONCE DAILY., Disp: , Rfl: 5 .  tiZANidine (ZANAFLEX) 4 MG tablet, Take 1 tablet (4 mg total) by mouth every 8 (eight) hours as needed for muscle spasms., Disp: 90 tablet, Rfl: 5 .  cetirizine (ZYRTEC) 10 MG tablet, Take 10 mg by mouth daily. , Disp: , Rfl:  .  Oxycodone HCl 10 MG TABS, Take 1 tablet (10 mg total) by mouth every 6 (six) hours as needed., Disp: 120 tablet, Rfl: 0  ROS  Constitutional: Denies any fever or chills Gastrointestinal: No reported hemesis, hematochezia, vomiting, or acute GI distress Musculoskeletal: Denies any acute onset joint swelling, redness, loss of ROM, or weakness Neurological: No reported episodes of acute onset apraxia, aphasia, dysarthria, agnosia, amnesia, paralysis, loss of coordination, or loss of consciousness  Allergies  Ms. Ramthun is allergic to aripiprazole; duloxetine; gabapentin; gold; iodinated diagnostic agents; naproxen sodium; nortriptyline hcl; nsaids; pregabalin; meperidine; ace inhibitors; aspirin; cefpodoxime; fluoxetine; fluticasone-salmeterol; levofloxacin; lithium; paroxetine; tape; telithromycin; theophylline; topiramate; trazodone; triamcinolone; venlafaxine; bupropion; ketorolac tromethamine; morphine; and moxifloxacin.  [place]  Drug: Ms. Putman  reports that she does not use  drugs. Alcohol:  reports that she does not drink alcohol. Tobacco:  reports that she has been smoking cigarettes. She has a 40.00 pack-year smoking history. She has never used smokeless tobacco. Medical:  has a past medical history of Acute postoperative pain (01/13/2017), Anxiety, Arthritis, Asthma, Bell's palsy, Bursitis, COPD (chronic obstructive pulmonary disease) ([place]), Depression, Fibromyalgia, Heart murmur, Hepatitis C, Hiatal hernia, Hyperlipidemia, Hypertension, IBS (irritable bowel syndrome), Insomnia, and Thyroid disease. Surgical: Ms. Portee  has a past surgical history that includes necksurgery (10/21/2014); Back surgery (2013); Foot surgery (Right); Elbow surgery (Left); Carpal tunnel release (Right); Nose surgery; Partial hysterectomy; Total shoulder replacement (Bilateral); and Colonoscopy with propofol (N/A, 02/05/2017). Family: family history includes Cancer in her mother; Gout in her mother; Heart disease in her father.  Constitutional Exam  General appearance: Well nourished, well developed, and well hydrated. In no apparent acute distress Vitals:   06/15/18 0838  BP: (!) 142/69  Pulse: 78  Resp: 18  Temp: 98.7 F (37.1 C)  SpO2: 100%  Weight: 243 lb (110.2 kg)  Height: '5\' 5"'  (1.651 m)   BMI Assessment: Estimated body mass index is 40.44 kg/m as calculated from the following:   Height as of this encounter: '5\' 5"'  (1.651 m).   Weight as of this encounter: 243 lb (110.2 kg).  BMI interpretation table: BMI level Category Range association with higher incidence of chronic pain  <18 kg/m2 Underweight   18.5-24.9 kg/m2 Ideal body weight   25-29.9 kg/m2 Overweight Increased incidence by 20%  30-34.9 kg/m2 Obese (Class I) Increased incidence by 68%  35-39.9 kg/m2 Severe obesity (Class II) Increased incidence by 136%  >40 kg/m2 Extreme obesity (Class III) Increased incidence by 254%   Patient's current BMI Ideal Body weight  Body mass index is 40.44 kg/m. Ideal body  weight: 57 kg (125 lb  10.6 oz) Adjusted ideal body weight: 78.3 kg (172 lb 9.6 oz)   BMI Readings from Last 4 Encounters:  06/15/18 40.44 kg/m  05/13/18 40.44 kg/m  04/30/18 40.27 kg/m  04/21/18 40.27 kg/m   Wt Readings from Last 4 Encounters:  06/15/18 243 lb (110.2 kg)  05/13/18 243 lb (110.2 kg)  04/30/18 242 lb (109.8 kg)  04/21/18 242 lb (109.8 kg)  Psych/Mental status: Alert, oriented x 3 (person, place, & time)       Eyes: PERLA Respiratory: No evidence of acute respiratory distress  Cervical Spine Area Exam  Skin & Axial Inspection: No masses, redness, edema, swelling, or associated skin lesions Alignment: Symmetrical Functional ROM: Unrestricted ROM      Stability: No instability detected Muscle Tone/Strength: Functionally intact. No obvious neuro-muscular anomalies detected. Sensory (Neurological): Unimpaired Palpation: No palpable anomalies              Upper Extremity (UE) Exam    Side: Right upper extremity  Side: Left upper extremity  Skin & Extremity Inspection: Skin color, temperature, and hair growth are WNL. No peripheral edema or cyanosis. No masses, redness, swelling, asymmetry, or associated skin lesions. No contractures.  Skin & Extremity Inspection: Skin color, temperature, and hair growth are WNL. No peripheral edema or cyanosis. No masses, redness, swelling, asymmetry, or associated skin lesions. No contractures.  Functional ROM: Unrestricted ROM          Functional ROM: Unrestricted ROM          Muscle Tone/Strength: Functionally intact. No obvious neuro-muscular anomalies detected.  Muscle Tone/Strength: Functionally intact. No obvious neuro-muscular anomalies detected.  Sensory (Neurological): Unimpaired          Sensory (Neurological): Unimpaired          Palpation: No palpable anomalies              Palpation: No palpable anomalies              Provocative Test(s):  Phalen's test: deferred Tinel's test: deferred Apley's scratch test (touch  opposite shoulder):  Action 1 (Across chest): deferred Action 2 (Overhead): deferred Action 3 (LB reach): deferred   Provocative Test(s):  Phalen's test: deferred Tinel's test: deferred Apley's scratch test (touch opposite shoulder):  Action 1 (Across chest): deferred Action 2 (Overhead): deferred Action 3 (LB reach): deferred    Thoracic Spine Area Exam  Skin & Axial Inspection: No masses, redness, or swelling Alignment: Symmetrical Functional ROM: Unrestricted ROM Stability: No instability detected Muscle Tone/Strength: Functionally intact. No obvious neuro-muscular anomalies detected. Sensory (Neurological): Unimpaired Muscle strength & Tone: No palpable anomalies  Lumbar Spine Area Exam  Skin & Axial Inspection: No masses, redness, or swelling Alignment: Symmetrical Functional ROM: Decreased ROM       Stability: No instability detected Muscle Tone/Strength: Functionally intact. No obvious neuro-muscular anomalies detected. Sensory (Neurological): Unimpaired Palpation: No palpable anomalies       Provocative Tests: Hyperextension/rotation test: deferred today       Lumbar quadrant test (Kemp's test): deferred today       Lateral bending test: deferred today       Patrick's Maneuver: deferred today                   FABER test: deferred today                   S-I anterior distraction/compression test: deferred today         S-I lateral compression test: deferred today  S-I Thigh-thrust test: deferred today         S-I Gaenslen's test: deferred today          Gait & Posture Assessment  Ambulation: Limited Gait: Modified gait pattern (slower gait speed, wider stride width, and longer stance duration) associated with morbid obesity Posture: Antalgic   Lower Extremity Exam    Side: Right lower extremity  Side: Left lower extremity  Stability: No instability observed          Stability: No instability observed          Skin & Extremity Inspection: Skin color,  temperature, and hair growth are WNL. No peripheral edema or cyanosis. No masses, redness, swelling, asymmetry, or associated skin lesions. No contractures.  Skin & Extremity Inspection: Skin color, temperature, and hair growth are WNL. No peripheral edema or cyanosis. No masses, redness, swelling, asymmetry, or associated skin lesions. No contractures.  Functional ROM: Diminished ROM for hip joint          Functional ROM: Diminished ROM for hip joint          Muscle Tone/Strength: Functionally intact. No obvious neuro-muscular anomalies detected.  Muscle Tone/Strength: Functionally intact. No obvious neuro-muscular anomalies detected.  Sensory (Neurological): Movement-associated discomfort        Sensory (Neurological): Movement-associated discomfort        DTR: Patellar: deferred today Achilles: deferred today Plantar: deferred today  DTR: Patellar: deferred today Achilles: deferred today Plantar: deferred today  Palpation: No palpable anomalies  Palpation: No palpable anomalies   Assessment  Primary Diagnosis & Pertinent Problem List: The primary encounter diagnosis was Chronic shoulder pain (Primary Source of Pain) (Bilateral) (R>L). 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  Status Diagnosis  Controlled Worsening Controlled 1. Chronic shoulder pain (Primary Source of Pain) (Bilateral) (R>L)   2. Chronic hip pain (Secondary source of pain) (Bilateral) (R>L)   3. Chronic low back pain [facility] source of pain) (Bilateral) (R>L)   4. 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)     Problems updated and reviewed during this visit: No problems updated. Plan of Care  Pharmacotherapy (Medications Ordered): No orders of the defined types were placed in this encounter.  Medications administered today: Sharnice A.  Domingo Cocking "ANGIE" had no medications administered during this visit.   Procedure Orders     HIP INJECTION     HIP INJECTION Lab Orders  No laboratory test(s) ordered today   Imaging Orders  No imaging studies ordered today   Referral Orders  No referral(s) requested today   Interventional management options: Planned, scheduled, and/or pending:   Palliative bilateral trochanteric bursa #1 under fluoro and IV sedation    Considering:   Palliative bilateralsuprascapular nerve block  Possible bilateral suprascapular nerve RFA Diagnostic bilateral intra-articular hip joint injection #4  (last one done on 04/30/2018) Possible bilateral hip joint RFA  Diagnostic bilateral lumbar facet block  Possible bilaterallumbar facet RFA  Diagnostic caudal epiduralsteroid injection + diagnostic epidurogram  Possible Racz procedure.  Diagnostic bilateral cervical facet block  Possible bilateral cervical facet RFA   Diagnostic right-sided cervical epidural steroid injection  Diagnostic bilateral sacroiliac joint block  Possible bilateral sacroiliac joint RFA    Palliative PRN treatment(s):   Palliative bilateralsuprascapular nerve block  Diagnostic bilateral intra-articular hip joint injection  Diagnostic bilateral lumbar facet block  Palliative right-sided lumbar facet  RFA #2 (last one done on 12/16/2017) Palliative left-sided lumbar facet RFA #2 (last one done on 02/12/2018) Diagnostic caudal epiduralsteroid injection + diagnostic epidurogram  Diagnostic bilateral cervical facet block  Diagnostic right-sided cervical epidural steroid injection  Diagnostic bilateral sacroiliac joint block    Provider-requested follow-up: Return for keep appointment w/ Crystal, NP, Procedure (w/ sedation): (B) Hip inj..  Future Appointments  Date Time Provider [place]  06/23/2018  9:45 AM Milinda Pointer, MD [facility] None  06/24/2018  3:00 PM [facility] None  06/24/2018  4:00 PM  Kris Hartmann, NP [facility] None  07/23/2018 11:15 AM Vevelyn Francois, NP [facility] None   Primary Care Physician: Danielle Drought, MD Location: [facility] Note by: Danielle Cola, MD Date: 06/15/2018; Time: 9:02 AM

## 2018-06-15 ENCOUNTER — Ambulatory Visit: Payer: Medicaid Other | Attending: Pain Medicine | Admitting: Pain Medicine

## 2018-06-15 ENCOUNTER — Other Ambulatory Visit: Payer: Self-pay

## 2018-06-15 VITALS — BP 142/69 | HR 78 | Temp 98.7°F | Resp 18 | Ht 65.0 in | Wt 243.0 lb

## 2018-06-15 DIAGNOSIS — G894 Chronic pain syndrome: Secondary | ICD-10-CM | POA: Insufficient documentation

## 2018-06-15 DIAGNOSIS — M25552 Pain in left hip: Secondary | ICD-10-CM | POA: Diagnosis not present

## 2018-06-15 DIAGNOSIS — M533 Sacrococcygeal disorders, not elsewhere classified: Secondary | ICD-10-CM | POA: Diagnosis not present

## 2018-06-15 DIAGNOSIS — M5441 Lumbago with sciatica, right side: Secondary | ICD-10-CM | POA: Diagnosis not present

## 2018-06-15 DIAGNOSIS — M25511 Pain in right shoulder: Secondary | ICD-10-CM | POA: Insufficient documentation

## 2018-06-15 DIAGNOSIS — M19012 Primary osteoarthritis, left shoulder: Secondary | ICD-10-CM | POA: Diagnosis not present

## 2018-06-15 DIAGNOSIS — M545 Low back pain: Secondary | ICD-10-CM | POA: Insufficient documentation

## 2018-06-15 DIAGNOSIS — M19011 Primary osteoarthritis, right shoulder: Secondary | ICD-10-CM | POA: Diagnosis not present

## 2018-06-15 DIAGNOSIS — I10 Essential (primary) hypertension: Secondary | ICD-10-CM | POA: Diagnosis not present

## 2018-06-15 DIAGNOSIS — M85811 Other specified disorders of bone density and structure, right shoulder: Secondary | ICD-10-CM | POA: Insufficient documentation

## 2018-06-15 DIAGNOSIS — Z6841 Body Mass Index (BMI) 40.0 and over, adult: Secondary | ICD-10-CM | POA: Diagnosis not present

## 2018-06-15 DIAGNOSIS — Z79899 Other long term (current) drug therapy: Secondary | ICD-10-CM | POA: Diagnosis not present

## 2018-06-15 DIAGNOSIS — E785 Hyperlipidemia, unspecified: Secondary | ICD-10-CM | POA: Insufficient documentation

## 2018-06-15 DIAGNOSIS — F17210 Nicotine dependence, cigarettes, uncomplicated: Secondary | ICD-10-CM | POA: Diagnosis not present

## 2018-06-15 DIAGNOSIS — M25512 Pain in left shoulder: Secondary | ICD-10-CM | POA: Insufficient documentation

## 2018-06-15 DIAGNOSIS — M797 Fibromyalgia: Secondary | ICD-10-CM | POA: Diagnosis not present

## 2018-06-15 DIAGNOSIS — M25551 Pain in right hip: Secondary | ICD-10-CM | POA: Insufficient documentation

## 2018-06-15 DIAGNOSIS — M4802 Spinal stenosis, cervical region: Secondary | ICD-10-CM | POA: Diagnosis not present

## 2018-06-15 DIAGNOSIS — F329 Major depressive disorder, single episode, unspecified: Secondary | ICD-10-CM | POA: Diagnosis not present

## 2018-06-15 DIAGNOSIS — J449 Chronic obstructive pulmonary disease, unspecified: Secondary | ICD-10-CM | POA: Insufficient documentation

## 2018-06-15 DIAGNOSIS — Z885 Allergy status to narcotic agent status: Secondary | ICD-10-CM | POA: Insufficient documentation

## 2018-06-15 DIAGNOSIS — Z79891 Long term (current) use of opiate analgesic: Secondary | ICD-10-CM | POA: Diagnosis not present

## 2018-06-15 DIAGNOSIS — Z881 Allergy status to other antibiotic agents status: Secondary | ICD-10-CM | POA: Insufficient documentation

## 2018-06-15 DIAGNOSIS — Z96611 Presence of right artificial shoulder joint: Secondary | ICD-10-CM | POA: Insufficient documentation

## 2018-06-15 DIAGNOSIS — M160 Bilateral primary osteoarthritis of hip: Secondary | ICD-10-CM | POA: Diagnosis not present

## 2018-06-15 DIAGNOSIS — F419 Anxiety disorder, unspecified: Secondary | ICD-10-CM | POA: Diagnosis not present

## 2018-06-15 DIAGNOSIS — M47816 Spondylosis without myelopathy or radiculopathy, lumbar region: Secondary | ICD-10-CM | POA: Insufficient documentation

## 2018-06-15 DIAGNOSIS — G8929 Other chronic pain: Secondary | ICD-10-CM

## 2018-06-15 NOTE — Progress Notes (Signed)
Safety precautions to be maintained throughout the outpatient stay will include: orient to surroundings, keep bed in low position, maintain call bell within reach at all times, provide assistance with transfer out of bed and ambulation.  

## 2018-06-15 NOTE — Patient Instructions (Addendum)
____________________________________________________________________________________________  Preparing for Procedure with Sedation  Instructions: . Oral Intake: Do not eat or drink anything for at least 8 hours prior to your procedure. . Transportation: Public transportation is not allowed. Bring an adult driver. The driver must be physically present in our waiting room before any procedure can be started. . Physical Assistance: Bring an adult physically capable of assisting you, in the event you need help. This adult should keep you company at home for at least 6 hours after the procedure. . Blood Pressure Medicine: Take your blood pressure medicine with a sip of water the morning of the procedure. . Blood thinners: Notify our staff if you are taking any blood thinners. Depending on which one you take, there will be specific instructions on how and when to stop it. . Diabetics on insulin: Notify the staff so that you can be scheduled 1st case in the morning. If your diabetes requires high dose insulin, take only  of your normal insulin dose the morning of the procedure and notify the staff that you have done so. . Preventing infections: Shower with an antibacterial soap the morning of your procedure. . Build-up your immune system: Take 1000 mg of Vitamin C with every meal (3 times a day) the day prior to your procedure. . Antibiotics: Inform the staff if you have a condition or reason that requires you to take antibiotics before dental procedures. . Pregnancy: If you are pregnant, call and cancel the procedure. . Sickness: If you have a cold, fever, or any active infections, call and cancel the procedure. . Arrival: You must be in the facility at least 30 minutes prior to your scheduled procedure. . Children: Do not bring children with you. . Dress appropriately: Bring dark clothing that you would not mind if they get stained. . Valuables: Do not bring any jewelry or valuables.  Procedure  appointments are reserved for interventional treatments only. . No Prescription Refills. . No medication changes will be discussed during procedure appointments. . No disability issues will be discussed.  Reasons to call and reschedule or cancel your procedure: (Following these recommendations will minimize the risk of a serious complication.) . Surgeries: Avoid having procedures within 2 weeks of any surgery. (Avoid for 2 weeks before or after any surgery). . Flu Shots: Avoid having procedures within 2 weeks of a flu shots or . (Avoid for 2 weeks before or after immunizations). . Barium: Avoid having a procedure within 7-10 days after having had a radiological study involving the use of radiological contrast. (Myelograms, Barium swallow or enema study). . Heart attacks: Avoid any elective procedures or surgeries for the initial 6 months after a "Myocardial Infarction" (Heart Attack). . Blood thinners: It is imperative that you stop these medications before procedures. Let us know if you if you take any blood thinner.  . Infection: Avoid procedures during or within two weeks of an infection (including chest colds or gastrointestinal problems). Symptoms associated with infections include: Localized redness, fever, chills, night sweats or profuse sweating, burning sensation when voiding, cough, congestion, stuffiness, runny nose, sore throat, diarrhea, nausea, vomiting, cold or Flu symptoms, recent or current infections. It is specially important if the infection is over the area that we intend to treat. . Heart and lung problems: Symptoms that may suggest an active cardiopulmonary problem include: cough, chest pain, breathing difficulties or shortness of breath, dizziness, ankle swelling, uncontrolled high or unusually low blood pressure, and/or palpitations. If you are experiencing any of these symptoms, cancel   your procedure and contact your primary care physician for an evaluation.  Remember:   Regular Business hours are:  Monday to Thursday 8:00 AM to 4:00 PM  Provider's Schedule: Milinda Pointer, MD:  Procedure days: Tuesday and Thursday 7:30 AM to 4:00 PM  Gillis Santa, MD:  Procedure days: Monday and Wednesday 7:30 AM to 4:00 PM ____________________________________________________________________________________________   GENERAL RISKS AND COMPLICATIONS  What are the risk, side effects and possible complications? Generally speaking, most procedures are safe.  However, with any procedure there are risks, side effects, and the possibility of complications.  The risks and complications are dependent upon the sites that are lesioned, or the type of nerve block to be performed.  The closer the procedure is to the spine, the more serious the risks are.  Great care is taken when placing the radio frequency needles, block needles or lesioning probes, but sometimes complications can occur. 1. Infection: Any time there is an injection through the skin, there is a risk of infection.  This is why sterile conditions are used for these blocks.  There are four possible types of infection. 1. Localized skin infection. 2. Central Nervous System Infection-This can be in the form of Meningitis, which can be deadly. 3. Epidural Infections-This can be in the form of an epidural abscess, which can cause pressure inside of the spine, causing compression of the spinal cord with subsequent paralysis. This would require an emergency surgery to decompress, and there are no guarantees that the patient would recover from the paralysis. 4. Discitis-This is an infection of the intervertebral discs.  It occurs in about 1% of discography procedures.  It is difficult to treat and it may lead to surgery.        2. Pain: the needles have to go through skin and soft tissues, will cause soreness.       3. Damage to internal structures:  The nerves to be lesioned may be near blood vessels or    other nerves which  can be potentially damaged.       4. Bleeding: Bleeding is more common if the patient is taking blood thinners such as  aspirin, Coumadin, Ticiid, Plavix, etc., or if he/she have some genetic predisposition  such as hemophilia. Bleeding into the spinal canal can cause compression of the spinal  cord with subsequent paralysis.  This would require an emergency surgery to  decompress and there are no guarantees that the patient would recover from the  paralysis.       5. Pneumothorax:  Puncturing of a lung is a possibility, every time a needle is introduced in  the area of the chest or upper back.  Pneumothorax refers to free air around the  collapsed lung(s), inside of the thoracic cavity (chest cavity).  Another two possible  complications related to a similar event would include: Hemothorax and Chylothorax.   These are variations of the Pneumothorax, where instead of air around the collapsed  lung(s), you may have blood or chyle, respectively.       6. Spinal headaches: They may occur with any procedures in the area of the spine.       7. Persistent CSF (Cerebro-Spinal Fluid) leakage: This is a rare problem, but may occur  with prolonged intrathecal or epidural catheters either due to the formation of a fistulous  track or a dural tear.       8. Nerve damage: By working so close to the spinal cord, there is always a possibility  of  nerve damage, which could be as serious as a permanent spinal cord injury with  paralysis.       9. Death:  Although rare, severe deadly allergic reactions known as "Anaphylactic  reaction" can occur to any of the medications used.      10. Worsening of the symptoms:  We can always make thing worse.  What are the chances of something like this happening? Chances of any of this occuring are extremely low.  By statistics, you have more of a chance of getting killed in a motor vehicle accident: while driving to the hospital than any of the above occurring .  Nevertheless, you  should be aware that they are possibilities.  In general, it is similar to taking a shower.  Everybody knows that you can slip, hit your head and get killed.  Does that mean that you should not shower again?  Nevertheless always keep in mind that statistics do not mean anything if you happen to be on the wrong side of them.  Even if a procedure has a 1 (one) in a 1,000,000 (million) chance of going wrong, it you happen to be that one..Also, keep in mind that by statistics, you have more of a chance of having something go wrong when taking medications.  Who should not have this procedure? If you are on a blood thinning medication (e.g. Coumadin, Plavix, see list of "Blood Thinners"), or if you have an active infection going on, you should not have the procedure.  If you are taking any blood thinners, please inform your physician.  How should I prepare for this procedure?  Do not eat or drink anything at least six hours prior to the procedure.  Bring a driver with you .  It cannot be a taxi.  Come accompanied by an adult that can drive you back, and that is strong enough to help you if your legs get weak or numb from the local anesthetic.  Take all of your medicines the morning of the procedure with just enough water to swallow them.  If you have diabetes, make sure that you are scheduled to have your procedure done first thing in the morning, whenever possible.  If you have diabetes, take only half of your insulin dose and notify our nurse that you have done so as soon as you arrive at the clinic.  If you are diabetic, but only take blood sugar pills (oral hypoglycemic), then do not take them on the morning of your procedure.  You may take them after you have had the procedure.  Do not take aspirin or any aspirin-containing medications, at least eleven (11) days prior to the procedure.  They may prolong bleeding.  Wear loose fitting clothing that may be easy to take off and that you would not  mind if it got stained with Betadine or blood.  Do not wear any jewelry or perfume  Remove any nail coloring.  It will interfere with some of our monitoring equipment.  NOTE: Remember that this is not meant to be interpreted as a complete list of all possible complications.  Unforeseen problems may occur.  BLOOD THINNERS The following drugs contain aspirin or other products, which can cause increased bleeding during surgery and should not be taken for 2 weeks prior to and 1 week after surgery.  If you should need take something for relief of minor pain, you may take acetaminophen which is found in Tylenol,m Datril, Anacin-3 and Panadol. It is  not blood thinner. The products listed below are.  Do not take any of the products listed below in addition to any listed on your instruction sheet.  A.P.C or A.P.C with Codeine Codeine Phosphate Capsules #3 Ibuprofen Ridaura  ABC compound Congesprin Imuran rimadil  Advil Cope Indocin Robaxisal  Alka-Seltzer Effervescent Pain Reliever and Antacid Coricidin or Coricidin-D  Indomethacin Rufen  Alka-Seltzer plus Cold Medicine Cosprin Ketoprofen S-A-C Tablets  Anacin Analgesic Tablets or Capsules Coumadin Korlgesic Salflex  Anacin Extra Strength Analgesic tablets or capsules CP-2 Tablets Lanoril Salicylate  Anaprox Cuprimine Capsules Levenox Salocol  Anexsia-D Dalteparin Magan Salsalate  Anodynos Darvon compound Magnesium Salicylate Sine-off  Ansaid Dasin Capsules Magsal Sodium Salicylate  Anturane Depen Capsules Marnal Soma  APF Arthritis pain formula Dewitt's Pills Measurin Stanback  Argesic Dia-Gesic Meclofenamic Sulfinpyrazone  Arthritis Bayer Timed Release Aspirin Diclofenac Meclomen Sulindac  Arthritis pain formula Anacin Dicumarol Medipren Supac  Analgesic (Safety coated) Arthralgen Diffunasal Mefanamic Suprofen  Arthritis Strength Bufferin Dihydrocodeine Mepro Compound Suprol  Arthropan liquid Dopirydamole Methcarbomol with Aspirin Synalgos   ASA tablets/Enseals Disalcid Micrainin Tagament  Ascriptin Doan's Midol Talwin  Ascriptin A/D Dolene Mobidin Tanderil  Ascriptin Extra Strength Dolobid Moblgesic Ticlid  Ascriptin with Codeine Doloprin or Doloprin with Codeine Momentum Tolectin  Asperbuf Duoprin Mono-gesic Trendar  Aspergum Duradyne Motrin or Motrin IB Triminicin  Aspirin plain, buffered or enteric coated Durasal Myochrisine Trigesic  Aspirin Suppositories Easprin Nalfon Trillsate  Aspirin with Codeine Ecotrin Regular or Extra Strength Naprosyn Uracel  Atromid-S Efficin Naproxen Ursinus  Auranofin Capsules Elmiron Neocylate Vanquish  Axotal Emagrin Norgesic Verin  Azathioprine Empirin or Empirin with Codeine Normiflo Vitamin E  Azolid Emprazil Nuprin Voltaren  Bayer Aspirin plain, buffered or children's or timed BC Tablets or powders Encaprin Orgaran Warfarin Sodium  Buff-a-Comp Enoxaparin Orudis Zorpin  Buff-a-Comp with Codeine Equegesic Os-Cal-Gesic   Buffaprin Excedrin plain, buffered or Extra Strength Oxalid   Bufferin Arthritis Strength Feldene Oxphenbutazone   Bufferin plain or Extra Strength Feldene Capsules Oxycodone with Aspirin   Bufferin with Codeine Fenoprofen Fenoprofen Pabalate or Pabalate-SF   Buffets II Flogesic Panagesic   Buffinol plain or Extra Strength Florinal or Florinal with Codeine Panwarfarin   Buf-Tabs Flurbiprofen Penicillamine   Butalbital Compound Four-way cold tablets Penicillin   Butazolidin Fragmin Pepto-Bismol   Carbenicillin Geminisyn Percodan   Carna Arthritis Reliever Geopen Persantine   Carprofen Gold's salt Persistin   Chloramphenicol Goody's Phenylbutazone   Chloromycetin Haltrain Piroxlcam   Clmetidine heparin Plaquenil   Cllnoril Hyco-pap Ponstel   Clofibrate Hydroxy chloroquine Propoxyphen         Before stopping any of these medications, be sure to consult the physician who ordered them.  Some, such as Coumadin (Warfarin) are ordered to prevent or treat serious  conditions such as "deep thrombosis", "pumonary embolisms", and other heart problems.  The amount of time that you may need off of the medication may also vary with the medication and the reason for which you were taking it.  If you are taking any of these medications, please make sure you notify your pain physician before you undergo any procedures.

## 2018-06-23 ENCOUNTER — Encounter: Payer: Self-pay | Admitting: Pain Medicine

## 2018-06-23 ENCOUNTER — Ambulatory Visit
Admission: RE | Admit: 2018-06-23 | Discharge: 2018-06-23 | Disposition: A | Discharge status: Home / Self Care | Payer: Medicaid Other | Source: Ambulatory Visit | Attending: Pain Medicine | Admitting: Pain Medicine

## 2018-06-23 ENCOUNTER — Ambulatory Visit (HOSPITAL_BASED_OUTPATIENT_CLINIC_OR_DEPARTMENT_OTHER): Payer: Medicaid Other | Admitting: Pain Medicine

## 2018-06-23 ENCOUNTER — Other Ambulatory Visit: Payer: Self-pay

## 2018-06-23 VITALS — BP 121/76 | HR 102 | Temp 98.4°F | Resp 15 | Ht 65.0 in | Wt 243.0 lb

## 2018-06-23 DIAGNOSIS — Z79891 Long term (current) use of opiate analgesic: Secondary | ICD-10-CM | POA: Insufficient documentation

## 2018-06-23 DIAGNOSIS — M25552 Pain in left hip: Secondary | ICD-10-CM

## 2018-06-23 DIAGNOSIS — M160 Bilateral primary osteoarthritis of hip: Secondary | ICD-10-CM

## 2018-06-23 DIAGNOSIS — M7062 Trochanteric bursitis, left hip: Secondary | ICD-10-CM

## 2018-06-23 DIAGNOSIS — Z96611 Presence of right artificial shoulder joint: Secondary | ICD-10-CM | POA: Diagnosis not present

## 2018-06-23 DIAGNOSIS — M7061 Trochanteric bursitis, right hip: Secondary | ICD-10-CM | POA: Diagnosis not present

## 2018-06-23 DIAGNOSIS — G8929 Other chronic pain: Secondary | ICD-10-CM

## 2018-06-23 DIAGNOSIS — Z9889 Other specified postprocedural states: Secondary | ICD-10-CM | POA: Diagnosis not present

## 2018-06-23 DIAGNOSIS — M25551 Pain in right hip: Secondary | ICD-10-CM | POA: Diagnosis not present

## 2018-06-23 DIAGNOSIS — Z885 Allergy status to narcotic agent status: Secondary | ICD-10-CM | POA: Diagnosis not present

## 2018-06-23 DIAGNOSIS — Z96612 Presence of left artificial shoulder joint: Secondary | ICD-10-CM | POA: Insufficient documentation

## 2018-06-23 DIAGNOSIS — Z886 Allergy status to analgesic agent status: Secondary | ICD-10-CM | POA: Diagnosis not present

## 2018-06-23 DIAGNOSIS — Z79899 Other long term (current) drug therapy: Secondary | ICD-10-CM | POA: Insufficient documentation

## 2018-06-23 DIAGNOSIS — Z888 Allergy status to other drugs, medicaments and biological substances status: Secondary | ICD-10-CM | POA: Insufficient documentation

## 2018-06-23 DIAGNOSIS — Z90711 Acquired absence of uterus with remaining cervical stump: Secondary | ICD-10-CM | POA: Diagnosis not present

## 2018-06-23 DIAGNOSIS — Z881 Allergy status to other antibiotic agents status: Secondary | ICD-10-CM | POA: Diagnosis not present

## 2018-06-23 MED ORDER — LIDOCAINE HCL 2 % IJ SOLN
20.0000 mL | Freq: Once | INTRAMUSCULAR | Status: AC
  Administered 2018-06-23: 400 mg
  Filled 2018-06-23: qty 20

## 2018-06-23 MED ORDER — ROPIVACAINE HCL 2 MG/ML IJ SOLN
INTRAMUSCULAR | Status: AC
  Filled 2018-06-23: qty 10

## 2018-06-23 MED ORDER — FENTANYL CITRATE (PF) 100 MCG/2ML IJ SOLN
25.0000 ug | INTRAMUSCULAR | Status: DC | PRN
  Administered 2018-06-23: 100 ug via INTRAVENOUS
  Filled 2018-06-23: qty 2

## 2018-06-23 MED ORDER — ROPIVACAINE HCL 2 MG/ML IJ SOLN
9.0000 mL | Freq: Once | INTRAMUSCULAR | Status: AC
  Administered 2018-06-23: 10 mL via INTRA_ARTICULAR
  Filled 2018-06-23: qty 10

## 2018-06-23 MED ORDER — LACTATED RINGERS IV SOLN
1000.0000 mL | Freq: Once | INTRAVENOUS | Status: AC
  Administered 2018-06-23: 1000 mL via INTRAVENOUS

## 2018-06-23 MED ORDER — METHYLPREDNISOLONE ACETATE 80 MG/ML IJ SUSP
80.0000 mg | Freq: Once | INTRAMUSCULAR | Status: AC
  Administered 2018-06-23: 80 mg via INTRA_ARTICULAR
  Filled 2018-06-23: qty 1

## 2018-06-23 MED ORDER — METHYLPREDNISOLONE ACETATE 80 MG/ML IJ SUSP
INTRAMUSCULAR | Status: AC
  Filled 2018-06-23: qty 1

## 2018-06-23 MED ORDER — MIDAZOLAM HCL 5 MG/5ML IJ SOLN
1.0000 mg | INTRAMUSCULAR | Status: DC | PRN
  Administered 2018-06-23: 4 mg via INTRAVENOUS
  Filled 2018-06-23: qty 5

## 2018-06-23 NOTE — Patient Instructions (Signed)

## 2018-06-23 NOTE — Progress Notes (Signed)
Safety precautions to be maintained throughout the outpatient stay will include: orient to surroundings, keep bed in low position, maintain call bell within reach at all times, provide assistance with transfer out of bed and ambulation.  

## 2018-06-23 NOTE — Progress Notes (Signed)
Patient's Name: Danielle [name]  MRN: [id number]  Referring Provider: Marygrace Drought, MD  DOB: 03/20/1961  PCP: Marygrace Drought, MD  DOS: 06/23/2018  Note by: Gaspar Cola, MD  Service setting: Ambulatory outpatient  Specialty: Interventional Pain Management  Patient type: Established  Location: [facility] (AMB) Pain Management Facility  Visit type: Interventional Procedure   Primary Reason for Visit: Interventional Pain Management Treatment. CC: Procedure (Hip Injection )  Procedure:          Anesthesia, Analgesia, Anxiolysis:  Type: Trochanteric Bursa Injection #1  Primary Purpose: Diagnostic Region: Upper (proximal) Femoral Region Level: Hip Joint Target Area: Superior aspect of the hip joint cavity, going thru the superior portion of the capsular ligament. Approach: Lateral approach Laterality: Bilateral  Type: Local Anesthesia Indication(s): Analgesia         Local Anesthetic: Lidocaine 1-2% Route: Infiltration ([place]/[facility]) IV Access: Declined Sedation: Declined   Position: Prone   Indications: 1. Chronic hip pain (Secondary source of pain) (Bilateral) (R>L)   2. Chronic trochanteric bursitis (Bilateral)   3. 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)   4. Chronic pain of both hips   5. Trochanteric bursitis of both hips   6. Primary osteoarthritis of hips, bilateral    Pain Score: Pre-procedure: 2 /10 Post-procedure: 0-No pain/10  Pre-op Assessment:  Danielle [name] is a 57 y.o. (year old), female patient, seen today for interventional treatment. She  has a past surgical history that includes necksurgery (10/21/2014); Back surgery (2013); Foot surgery (Right); Elbow surgery (Left); Carpal tunnel release (Right); Nose surgery; Partial hysterectomy; Total shoulder replacement (Bilateral); and Colonoscopy with propofol (N/A, 02/05/2017). Danielle [name] has a current medication list which includes the following prescription(s): albuterol, cholecalciferol, citalopram, diclofenac sodium,  docusate sodium, fexofenadine, levothyroxine, magnesium hydroxide, quad cane, naloxone, vios lc sprint deluxe, nystatin, oxycodone hcl, spiriva handihaler, tizanidine, cetirizine, oxycodone hcl, and oxycodone hcl, and the following Facility-Administered Medications: fentanyl, lactated ringers, and midazolam. Her primarily concern today is the Procedure (Hip Injection )  Initial Vital Signs:  Pulse/HCG Rate: (!) 102ECG Heart Rate: 80 Temp: 98.4 F (36.9 C) Resp: 18 BP: (!) 144/108 SpO2: 96 %  BMI: Estimated body mass index is 40.44 kg/m as calculated from the following:   Height as of this encounter: 5\' 5"  (1.651 m).   Weight as of this encounter: 243 lb (110.2 kg).  Risk Assessment: Allergies: Reviewed. She is allergic to aripiprazole; duloxetine; gabapentin; gold; iodinated diagnostic agents; naproxen sodium; nortriptyline hcl; nsaids; pregabalin; meperidine; ace inhibitors; aspirin; cefpodoxime; fluoxetine; fluticasone-salmeterol; levofloxacin; lithium; paroxetine; tape; telithromycin; theophylline; topiramate; trazodone; triamcinolone; venlafaxine; bupropion; ketorolac tromethamine; morphine; and moxifloxacin.  Allergy Precautions: None required Coagulopathies: Reviewed. None identified.  Blood-thinner therapy: None at this time Active Infection(s): Reviewed. None identified. Danielle [name] is afebrile  Site Confirmation: Danielle [name] was asked to confirm the procedure and laterality before marking the site Procedure checklist: Completed Consent: Before the procedure and under the influence of no sedative(s), amnesic(s), or anxiolytics, the patient was informed of the treatment options, risks and possible complications. To fulfill our ethical and legal obligations, as recommended by the American Medical Association's Code of Ethics, I have informed the patient of my clinical impression; the nature and purpose of the treatment or procedure; the risks, benefits, and possible complications of the  intervention; the alternatives, including doing nothing; the risk(s) and benefit(s) of the alternative treatment(s) or procedure(s); and the risk(s) and benefit(s) of doing nothing. The patient was provided information about the general risks and possible complications associated with  the procedure. These may include, but are not limited to: failure to achieve desired goals, infection, bleeding, organ or nerve damage, allergic reactions, paralysis, and death. In addition, the patient was informed of those risks and complications associated to the procedure, such as failure to decrease pain; infection; bleeding; organ or nerve damage with subsequent damage to sensory, motor, and/or autonomic systems, resulting in permanent pain, numbness, and/or weakness of one or several areas of the body; allergic reactions; (i.e.: anaphylactic reaction); and/or death. Furthermore, the patient was informed of those risks and complications associated with the medications. These include, but are not limited to: allergic reactions (i.e.: anaphylactic or anaphylactoid reaction(s)); adrenal axis suppression; blood sugar elevation that in diabetics may result in ketoacidosis or comma; water retention that in patients with history of congestive heart failure may result in shortness of breath, pulmonary edema, and decompensation with resultant heart failure; weight gain; swelling or edema; medication-induced neural toxicity; particulate matter embolism and blood vessel occlusion with resultant organ, and/or nervous system infarction; and/or aseptic necrosis of one or more joints. Finally, the patient was informed that Medicine is not an exact science; therefore, there is also the possibility of unforeseen or unpredictable risks and/or possible complications that may result in a catastrophic outcome. The patient indicated having understood very clearly. We have given the patient no guarantees and we have made no promises. Enough time  was given to the patient to ask questions, all of which were answered to the patient's satisfaction. Danielle [name] has indicated that she wanted to continue with the procedure. Attestation: I, the ordering provider, attest that I have discussed with the patient the benefits, risks, side-effects, alternatives, likelihood of achieving goals, and potential problems during recovery for the procedure that I have provided informed consent. Date  Time: 06/23/2018  9:34 AM  Pre-Procedure Preparation:  Monitoring: As per clinic protocol. Respiration, ETCO2, SpO2, BP, heart rate and rhythm monitor placed and checked for adequate function Safety Precautions: Patient was assessed for positional comfort and pressure points before starting the procedure. Time-out: I initiated and conducted the "Time-out" before starting the procedure, as per protocol. The patient was asked to participate by confirming the accuracy of the "Time Out" information. Verification of the correct person, site, and procedure were performed and confirmed by me, the nursing staff, and the patient. "Time-out" conducted as per Joint Commission's Universal Protocol (UP.01.01.01). Time: 1030  Description of Procedure:          Area Prepped: Entire Posterolateral hip area. Prepping solution: ChloraPrep (2% chlorhexidine gluconate and 70% isopropyl alcohol) Safety Precautions: Aspiration looking for blood return was conducted prior to all injections. At no point did we inject any substances, as a needle was being advanced. No attempts were made at seeking any paresthesias. Safe injection practices and needle disposal techniques used. Medications properly checked for expiration dates. SDV (single dose vial) medications used. Description of the Procedure: Protocol guidelines were followed. The patient was placed in position over the procedure table. The target area was identified and the area prepped in the usual manner. Skin & deeper tissues  infiltrated with local anesthetic. Appropriate amount of time allowed to pass for local anesthetics to take effect. The procedure needles were then advanced to the target area. Proper needle placement secured. Negative aspiration confirmed. Solution injected in intermittent fashion, asking for systemic symptoms every 0.5cc of injectate. The needles were then removed and the area cleansed, making sure to leave some of the prepping solution back to take advantage of its  long term bactericidal properties. Vitals:   06/23/18 1049 06/23/18 1050 06/23/18 1059 06/23/18 1108  BP: 102/67  125/82 121/76  Pulse:      Resp: 14  16 15   Temp: 97.8 F (36.6 C)   98.4 F (36.9 C)  TempSrc: Temporal   Temporal  SpO2: 95% (!) 89% 99% 97%  Weight:      Height:        Start Time: 1030 hrs. End Time: 1041 hrs.  Materials:  Needle(s) Type: Spinal Needle Gauge: 22G Length: 5.0-in Medication(s): Please see orders for medications and dosing details.  Imaging Guidance (Non-Spinal):          Type of Imaging Technique: Fluoroscopy Guidance (Non-Spinal) Indication(s): Assistance in needle guidance and placement for procedures requiring needle placement in or near specific anatomical locations not easily accessible without such assistance. Exposure Time: Please see nurses notes. Contrast: Before injecting any contrast, we confirmed that the patient did not have an allergy to iodine, shellfish, or radiological contrast. Once satisfactory needle placement was completed at the desired level, radiological contrast was injected. Contrast injected under live fluoroscopy. No contrast complications. See chart for type and volume of contrast used. Fluoroscopic Guidance: I was personally present during the use of fluoroscopy. "Tunnel Vision Technique" used to obtain the best possible view of the target area. Parallax error corrected before commencing the procedure. "Direction-depth-direction" technique used to introduce the  needle under continuous pulsed fluoroscopy. Once target was reached, antero-posterior, oblique, and lateral fluoroscopic projection used confirm needle placement in all planes. Images permanently stored in EMR. Interpretation: I personally interpreted the imaging intraoperatively. Adequate needle placement confirmed in multiple planes. Appropriate spread of contrast into desired area was observed. No evidence of afferent or efferent intravascular uptake. Permanent images saved into the patient's record.  Antibiotic Prophylaxis:   Anti-infectives (From admission, onward)   None     Indication(s): None identified  Post-operative Assessment:  Post-procedure Vital Signs:  Pulse/HCG Rate: (!) 10280 Temp: 98.4 F (36.9 C) Resp: 15 BP: 121/76 SpO2: 97 %  EBL: None  Complications: No immediate post-treatment complications observed by team, or reported by patient.  Note: The patient tolerated the entire procedure well. A repeat set of vitals were taken after the procedure and the patient was kept under observation following institutional policy, for this type of procedure. Post-procedural neurological assessment was performed, showing return to baseline, prior to discharge. The patient was provided with post-procedure discharge instructions, including a section on how to identify potential problems. Should any problems arise concerning this procedure, the patient was given instructions to immediately contact us, at any time, without hesitation. In any case, we plan to contact the patient by telephone for a follow-up status report regarding this interventional procedure.  Comments:  No additional relevant information.  Plan of Care    Imaging Orders     DG C-Arm 1-60 Min-No Report  Procedure Orders     HIP INJECTION  Medications ordered for procedure: Meds ordered this encounter  Medications  . lidocaine (XYLOCAINE) 2 % (with pres) injection 400 mg  . midazolam (VERSED) 5 MG/5ML  injection 1-2 mg    Make sure Flumazenil is available in the pyxis when using this medication. If oversedation occurs, administer 0.2 mg IV over 15 sec. If after 45 sec no response, administer 0.2 mg again over 1 min; may repeat at 1 min intervals; not to exceed 4 doses (1 mg)  . fentaNYL (SUBLIMAZE) injection 25-50 mcg    Make sure Narcan  is available in the pyxis when using this medication. In the event of respiratory depression (RR< 8/min): Titrate NARCAN (naloxone) in increments of 0.1 to 0.2 mg IV at 2-3 minute intervals, until desired degree of reversal.  . lactated ringers infusion 1,000 mL  . ropivacaine (PF) 2 mg/mL (0.2%) (NAROPIN) injection 9 mL  . methylPREDNISolone acetate (DEPO-MEDROL) injection 80 mg   Medications administered: We administered lidocaine, midazolam, fentaNYL, lactated ringers, ropivacaine (PF) 2 mg/mL (0.2%), and methylPREDNISolone acetate.  See the medical record for exact dosing, route, and time of administration.  Disposition: Discharge home  Discharge Date & Time: 06/23/2018; 1111 hrs.   Physician-requested Follow-up: Return for post-procedure eval (2 wks), w/ Dionisio David, NP.  Future Appointments  Date Time Provider [place]  06/24/2018  3:00 PM [facility] None  06/24/2018  4:00 PM Kris Hartmann, NP [facility] None  07/23/2018 11:15 AM Vevelyn Francois, NP [facility] None   Primary Care Physician: Marygrace Drought, MD Location: [facility] Facility Note by: Gaspar Cola, MD Date: 06/23/2018; Time: 11:15 AM  Disclaimer:  Medicine is not an exact science. The only guarantee in medicine is that nothing is guaranteed. It is important to note that the decision to proceed with this intervention was based on the information collected from the patient. The Data and conclusions were drawn from the patient's questionnaire, the interview, and the physical examination. Because the information was provided in large part  by the patient, it cannot be guaranteed that it has not been purposely or unconsciously manipulated. Every effort has been made to obtain as much relevant data as possible for this evaluation. It is important to note that the conclusions that lead to this procedure are derived in large part from the available data. Always take into account that the treatment will also be dependent on availability of resources and existing treatment guidelines, considered by other Pain Management Practitioners as being common knowledge and practice, at the time of the intervention. For Medico-Legal purposes, it is also important to point out that variation in procedural techniques and pharmacological choices are the acceptable norm. The indications, contraindications, technique, and results of the above procedure should only be interpreted and judged by a Board-Certified Interventional Pain Specialist with extensive familiarity and expertise in the same exact procedure and technique.

## 2018-06-24 ENCOUNTER — Telehealth: Payer: Self-pay | Admitting: *Deleted

## 2018-06-24 ENCOUNTER — Encounter (INDEPENDENT_AMBULATORY_CARE_PROVIDER_SITE_OTHER): Payer: Medicaid Other

## 2018-06-24 ENCOUNTER — Ambulatory Visit (INDEPENDENT_AMBULATORY_CARE_PROVIDER_SITE_OTHER): Payer: Medicaid Other | Admitting: Nurse Practitioner

## 2018-06-24 NOTE — Telephone Encounter (Signed)
No problems post procedure. 

## 2018-07-20 ENCOUNTER — Encounter (INDEPENDENT_AMBULATORY_CARE_PROVIDER_SITE_OTHER): Payer: Medicaid Other

## 2018-07-20 ENCOUNTER — Ambulatory Visit (INDEPENDENT_AMBULATORY_CARE_PROVIDER_SITE_OTHER): Payer: Medicaid Other | Admitting: Nurse Practitioner

## 2018-07-23 ENCOUNTER — Encounter: Payer: Self-pay | Admitting: Nurse Practitioner

## 2018-07-23 ENCOUNTER — Ambulatory Visit: Payer: Medicaid Other | Attending: Nurse Practitioner | Admitting: Nurse Practitioner

## 2018-07-23 ENCOUNTER — Other Ambulatory Visit: Payer: Self-pay

## 2018-07-23 VITALS — BP 160/85 | HR 90 | Temp 98.4°F | Ht 65.0 in | Wt 242.0 lb

## 2018-07-23 DIAGNOSIS — G8929 Other chronic pain: Secondary | ICD-10-CM

## 2018-07-23 DIAGNOSIS — M25511 Pain in right shoulder: Secondary | ICD-10-CM | POA: Insufficient documentation

## 2018-07-23 DIAGNOSIS — M47816 Spondylosis without myelopathy or radiculopathy, lumbar region: Secondary | ICD-10-CM

## 2018-07-23 DIAGNOSIS — M62830 Muscle spasm of back: Secondary | ICD-10-CM

## 2018-07-23 DIAGNOSIS — G894 Chronic pain syndrome: Secondary | ICD-10-CM | POA: Diagnosis not present

## 2018-07-23 DIAGNOSIS — M4802 Spinal stenosis, cervical region: Secondary | ICD-10-CM

## 2018-07-23 DIAGNOSIS — M25512 Pain in left shoulder: Secondary | ICD-10-CM | POA: Insufficient documentation

## 2018-07-23 MED ORDER — TIZANIDINE HCL 4 MG PO TABS: 4.0000 mg | ORAL_TABLET | Freq: Three times a day (TID) | ORAL | 0 refills | Status: DC | PRN

## 2018-07-23 MED ORDER — OXYCODONE HCL 10 MG PO TABS: 10.0000 mg | ORAL_TABLET | Freq: Four times a day (QID) | ORAL | 0 refills | Status: DC | PRN

## 2018-07-23 MED ORDER — DICLOFENAC SODIUM 1 % TD GEL: 2.0000 g | Freq: Four times a day (QID) | TRANSDERMAL | 2 refills | Status: DC

## 2018-07-23 NOTE — Progress Notes (Signed)
Nursing Pain Medication Assessment:  Safety precautions to be maintained throughout the outpatient stay will include: orient to surroundings, keep bed in low position, maintain call bell within reach at all times, provide assistance with transfer out of bed and ambulation.  Medication Inspection Compliance: Pill count conducted under aseptic conditions, in front of the patient. Neither the pills nor the bottle was removed from the patient's sight at any time. Once count was completed pills were immediately returned to the patient in their original bottle.  Medication: Oxycodone IR Pill/Patch Count: 2 of 120 pills remain Pill/Patch Appearance: Markings consistent with prescribed medication Bottle Appearance: Standard pharmacy container. Clearly labeled. Filled Date: 52 / 9 / 2019 Last Medication intake:  Today

## 2018-07-23 NOTE — Progress Notes (Signed)
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 hours as needed.    Dispense:  120 tablet    Refill:  0    Do not add this medication to the electronic "Automatic Refill" notification system. Patient may have prescription filled one day early if pharmacy is closed on scheduled refill date.    Order Specific Question:   Supervising Provider    Answer:   Milinda Pointer [phone]  . Oxycodone HCl 10 MG TABS    Sig: Take 1 tablet (10 mg total) by mouth every 6 (six) hours as needed.    Dispense:  120 tablet    Refill:  0    Do not add this medication to the electronic "Automatic Refill" notification system. Patient may have prescription filled one day early if pharmacy is closed on scheduled refill date.    Order Specific Question:   Supervising Provider    Answer:   Milinda Pointer [phone]  . tiZANidine (ZANAFLEX) 4 MG tablet    Sig: Take 1 tablet (4 mg total) by mouth every 8 (eight) hours as needed for muscle spasms.     Dispense:  270 tablet    Refill:  0    Do not place this medication, or any other prescription from our practice, on "Automatic Refill". Patient may have prescription filled one day early if pharmacy is closed on scheduled refill date.    Order Specific Question:   Supervising Provider    Answer:   Milinda Pointer [phone]  . diclofenac sodium (VOLTAREN) 1 % GEL    Sig: Apply 2 g topically 4 (four) times daily.    Dispense:  100 g    Refill:  2    Order Specific Question:   Supervising Provider    AnswerMilinda Pointer [phone]   New Prescriptions   No medications on file   Medications administered today: Javona A. Domingo Cocking "ANGIE" had no medications administered during this visit. Lab-work, procedure(s), and/or referral(s): Orders Placed This Encounter  Procedures  . Radiofrequency Shoulder Joint   Imaging and/or referral(s): None  Interventional management options: Planned, scheduled, and/or pending:   Diagnostic bilateral suprascapular nerveRFAright shoulder first.   Considering: Palliative bilateralsuprascapular nerve block Possible bilateral suprascapular nerveRFA Diagnostic bilateral intra-articular hip joint injection #4 (last one done on 04/30/2018) Possible bilateral hip jointRFA Diagnostic bilateral lumbar facet block Possible bilaterallumbar facet RFA Diagnostic caudal epiduralsteroid injection + diagnostic epidurogram  Possible Racz procedure.  Diagnostic bilateral  cervical facet block Possible bilateral cervical facetRFA Diagnostic right-sided cervical epidural steroid injection  Diagnostic bilateral sacroiliac joint block Possible bilateral sacroiliac joint RFA   Palliative PRN treatment(s): Palliative bilateralsuprascapular nerve block Diagnostic bilateral intra-articular hip joint injection Diagnostic bilateral lumbar facet block Palliative right-sided lumbar facet RFA #2(last one done on 12/16/2017) Palliative  left-sided lumbar facet RFA #2(last one done on 02/12/2018) Diagnostic caudal epiduralsteroid injection + diagnostic epidurogram  Diagnostic bilateral cervical facet block Diagnostic right-sided cervical epidural steroid injection  Diagnostic bilateral sacroiliac joint block    Provider-requested follow-up: Return in about 3 months (around 10/22/2018) for MedMgmt, Procedure(w/Sedation), w/ Dr. [name].  Future Appointments  Date Time Provider [place]  09/01/2018 10:30 AM Milinda Pointer, MD [facility] None  10/21/2018  1:45 PM Vevelyn Francois, NP [facility] None   Primary Care Physician: Marygrace Drought, MD Location: [facility] Note by: Vevelyn Francois NP Date: 07/23/2018; Time: 6:58 PM  Pain Score Disclaimer: We use the NRS-11 scale. This is a self-reported, subjective measurement of pain severity with only modest accuracy. It is used primarily to identify changes within a particular patient. It must be understood that outpatient pain scales are significantly less accurate that those used for research, where they can be applied under ideal controlled circumstances with minimal exposure to variables. In reality, the score is likely to be a combination of pain intensity and pain affect, where pain affect describes the degree of emotional arousal or changes in action readiness caused by the sensory experience of pain. Factors such as social and work situation, setting, emotional state, anxiety levels, expectation, and prior pain experience may influence pain perception and show large inter-individual differences that may also be affected by time variables.  Patient instructions provided during this appointment: Patient Instructions  ____________________________________________________________________________________________  Medication Rules  Purpose: To inform patients, and their family members, of our rules and regulations.  Applies to: All patients  receiving prescriptions (written or electronic).  Pharmacy of record: Pharmacy where electronic prescriptions will be sent. If written prescriptions are taken to a different pharmacy, please inform the nursing staff. The pharmacy listed in the electronic medical record should be the one where you would like electronic prescriptions to be sent.  Electronic prescriptions: In compliance with the [place] (STOP) Act of 2017 (Session Lanny Cramp [phone]), effective July 15, 2018, all controlled substances must be electronically prescribed. Calling prescriptions to the pharmacy will cease to exist.  Prescription refills: Only during scheduled appointments. Applies to all prescriptions.  NOTE: The following applies primarily to controlled substances (Opioid* Pain Medications).   Patient's responsibilities: 1. Pain Pills: Bring all pain pills to every appointment (except for procedure appointments). 2. Pill Bottles: Bring pills in original pharmacy bottle. Always bring the newest bottle. Bring bottle, even if empty. 3. Medication refills: You are responsible for knowing and keeping track of what medications you take and those you need refilled. The day before your appointment: write a list of all prescriptions that need to be refilled. The day of the appointment: give the list to the admitting nurse. Prescriptions will be written only during appointments. If you forget a medication: it will not be "Called in", "Faxed", or "electronically sent". You will need to get another appointment to get these prescribed. No early refills. Do not call asking to have your prescription filled early. 4. Prescription Accuracy: You are responsible for carefully inspecting your prescriptions before leaving our office. Have the discharge nurse carefully go over each  prescription with you, before taking them home. Make sure that your name is accurately spelled, that your address is  correct. Check the name and dose of your medication to make sure it is accurate. Check the number of pills, and the written instructions to make sure they are clear and accurate. Make sure that you are given enough medication to last until your next medication refill appointment. 5. Taking Medication: Take medication as prescribed. When it comes to controlled substances, taking less pills or less frequently than prescribed is permitted and encouraged. Never take more pills than instructed. Never take medication more frequently than prescribed.  6. Inform other Doctors: Always inform, all of your healthcare providers, of all the medications you take. 7. Pain Medication from other Providers: You are not allowed to accept any additional pain medication from any other Doctor or Healthcare provider. There are two exceptions to this rule. (see below) In the event that you require additional pain medication, you are responsible for notifying us, as stated below. 8. Medication Agreement: You are responsible for carefully reading and following our Medication Agreement. This must be signed before receiving any prescriptions from our practice. Safely store a copy of your signed Agreement. Violations to the Agreement will result in no further prescriptions. (Additional copies of our Medication Agreement are available upon request.) 9. Laws, Rules, & Regulations: All patients are expected to follow all Federal and [employer], Rules, [employer]. Ignorance of the Laws does not constitute a valid excuse. The use of any illegal substances is prohibited. 10. Adopted CDC guidelines & recommendations: Target dosing levels will be at or below 60 MME/day. Use of benzodiazepines** is not recommended.  Exceptions: There are only two exceptions to the rule of not receiving pain medications from other Healthcare Providers. 1. Exception #1 (Emergencies): In the event of an emergency (i.e.: accident requiring emergency  care), you are allowed to receive additional pain medication. However, you are responsible for: As soon as you are able, call our office [phone], at any time of the day or night, and leave a message stating your name, the date and nature of the emergency, and the name and dose of the medication prescribed. In the event that your call is answered by a member of our staff, make sure to document and save the date, time, and the name of the person that took your information.  2. Exception #2 (Planned Surgery): In the event that you are scheduled by another doctor or dentist to have any type of surgery or procedure, you are allowed (for a period no longer than 30 days), to receive additional pain medication, for the acute post-op pain. However, in this case, you are responsible for picking up a copy of our "Post-op Pain Management for Surgeons" handout, and giving it to your surgeon or dentist. This document is available at our office, and does not require an appointment to obtain it. Simply go to our office during business hours (Monday-Thursday from 8:00 AM to 4:00 PM) (Friday 8:00 AM to 12:00 Noon) or if you have a scheduled appointment with [place], prior to your surgery, and ask for it by name. In addition, you will need to provide [place] with your name, name of your surgeon, type of surgery, and date of procedure or surgery.  *Opioid medications include: morphine, codeine, oxycodone, oxymorphone, hydrocodone, hydromorphone, meperidine, tramadol, tapentadol, buprenorphine, fentanyl, methadone. **Benzodiazepine medications include: diazepam (Valium), alprazolam (Xanax), clonazepam (Klonopine), lorazepam (Ativan), clorazepate (Tranxene), chlordiazepoxide (Librium), estazolam (Prosom), oxazepam (Serax),  temazepam (Restoril), triazolam (Halcion) (Last updated: 09/11/2017) ____________________________________________________________________________________________

## 2018-07-23 NOTE — Patient Instructions (Signed)
____________________________________________________________________________________________  Medication Rules  Purpose: To inform patients, and their family members, of our rules and regulations.  Applies to: All patients receiving prescriptions (written or electronic).  Pharmacy of record: Pharmacy where electronic prescriptions will be sent. If written prescriptions are taken to a different pharmacy, please inform the nursing staff. The pharmacy listed in the electronic medical record should be the one where you would like electronic prescriptions to be sent.  Electronic prescriptions: In compliance with the [place] Opioid Misuse Prevention (STOP) Act of 2017 (Session Law 2017-74/H243), effective July 15, 2018, all controlled substances must be electronically prescribed. Calling prescriptions to the pharmacy will cease to exist.  Prescription refills: Only during scheduled appointments. Applies to all prescriptions.  NOTE: The following applies primarily to controlled substances (Opioid* Pain Medications).   Patient's responsibilities: 1. Pain Pills: Bring all pain pills to every appointment (except for procedure appointments). 2. Pill Bottles: Bring pills in original pharmacy bottle. Always bring the newest bottle. Bring bottle, even if empty. 3. Medication refills: You are responsible for knowing and keeping track of what medications you take and those you need refilled. The day before your appointment: write a list of all prescriptions that need to be refilled. The day of the appointment: give the list to the admitting nurse. Prescriptions will be written only during appointments. If you forget a medication: it will not be "Called in", "Faxed", or "electronically sent". You will need to get another appointment to get these prescribed. No early refills. Do not call asking to have your prescription filled early. 4. Prescription Accuracy: You are responsible for  carefully inspecting your prescriptions before leaving our office. Have the discharge nurse carefully go over each prescription with you, before taking them home. Make sure that your name is accurately spelled, that your address is correct. Check the name and dose of your medication to make sure it is accurate. Check the number of pills, and the written instructions to make sure they are clear and accurate. Make sure that you are given enough medication to last until your next medication refill appointment. 5. Taking Medication: Take medication as prescribed. When it comes to controlled substances, taking less pills or less frequently than prescribed is permitted and encouraged. Never take more pills than instructed. Never take medication more frequently than prescribed.  6. Inform other Doctors: Always inform, all of your healthcare providers, of all the medications you take. 7. Pain Medication from other Providers: You are not allowed to accept any additional pain medication from any other Doctor or Healthcare provider. There are two exceptions to this rule. (see below) In the event that you require additional pain medication, you are responsible for notifying us, as stated below. 8. Medication Agreement: You are responsible for carefully reading and following our Medication Agreement. This must be signed before receiving any prescriptions from our practice. Safely store a copy of your signed Agreement. Violations to the Agreement will result in no further prescriptions. (Additional copies of our Medication Agreement are available upon request.) 9. Laws, Rules, & Regulations: All patients are expected to follow all Federal and State Laws, Statutes, Rules, & Regulations. Ignorance of the Laws does not constitute a valid excuse. The use of any illegal substances is prohibited. 10. Adopted CDC guidelines & recommendations: Target dosing levels will be at or below 60 MME/day. Use of benzodiazepines** is not  recommended.  Exceptions: There are only two exceptions to the rule of not receiving pain medications from other Healthcare Providers. 1.   Exception #1 (Emergencies): In the event of an emergency (i.e.: accident requiring emergency care), you are allowed to receive additional pain medication. However, you are responsible for: As soon as you are able, call our office [phone], at any time of the day or night, and leave a message stating your name, the date and nature of the emergency, and the name and dose of the medication prescribed. In the event that your call is answered by a member of our staff, make sure to document and save the date, time, and the name of the person that took your information.  2. Exception #2 (Planned Surgery): In the event that you are scheduled by another doctor or dentist to have any type of surgery or procedure, you are allowed (for a period no longer than 30 days), to receive additional pain medication, for the acute post-op pain. However, in this case, you are responsible for picking up a copy of our "Post-op Pain Management for Surgeons" handout, and giving it to your surgeon or dentist. This document is available at our office, and does not require an appointment to obtain it. Simply go to our office during business hours (Monday-Thursday from 8:00 AM to 4:00 PM) (Friday 8:00 AM to 12:00 Noon) or if you have a scheduled appointment with us, prior to your surgery, and ask for it by name. In addition, you will need to provide us with your name, name of your surgeon, type of surgery, and date of procedure or surgery.  *Opioid medications include: morphine, codeine, oxycodone, oxymorphone, hydrocodone, hydromorphone, meperidine, tramadol, tapentadol, buprenorphine, fentanyl, methadone. **Benzodiazepine medications include: diazepam (Valium), alprazolam (Xanax), clonazepam (Klonopine), lorazepam (Ativan), clorazepate (Tranxene), chlordiazepoxide (Librium), estazolam (Prosom),  oxazepam (Serax), temazepam (Restoril), triazolam (Halcion) (Last updated: 09/11/2017) ____________________________________________________________________________________________    

## 2018-08-25 ENCOUNTER — Ambulatory Visit: Payer: Medicaid Other | Admitting: Pain Medicine

## 2018-08-31 ENCOUNTER — Telehealth: Payer: Self-pay | Admitting: *Deleted

## 2018-08-31 NOTE — Telephone Encounter (Signed)
Spoke with patient.  She states she is coughing very bad, has a runny nose and does not want to risk getting sick worse due to the steroid.  Patient states that she will just reschedule.  Tendra notified to cancel appointment.

## 2018-09-01 ENCOUNTER — Ambulatory Visit: Payer: Medicaid Other | Admitting: Pain Medicine

## 2018-09-01 NOTE — Progress Notes (Deleted)
Patient's Name: Danielle [name]  MRN: [id number]  Referring Provider: Marygrace Drought, MD  DOB: 1960/07/20  PCP: Marygrace Drought, MD  DOS: 09/01/2018  Note by: Gaspar Cola, MD  Service setting: Ambulatory outpatient  Specialty: Interventional Pain Management  Patient type: Established  Location: [facility] (AMB) Pain Management Facility  Visit type: Interventional Procedure   Primary Reason for Visit: Interventional Pain Management Treatment. CC: No chief complaint on file.  Procedure:          Anesthesia, Analgesia, Anxiolysis:  Type: Suprascapular nerve Radiofrequency Ablation #1  Primary Purpose: Therapeutic Region: Posterior Shoulder & Scapular Areas Level: Superior to the scapular spine, in the lateral aspect of the supraspinatus fossa (Suprescapular notch). Target Area: Suprascapular nerve as it passes thru the lower portion of the suprascapular notch. Approach: Posterior percutaneous approach. Laterality: Bilateral  Type: Moderate (Conscious) Sedation combined with Local Anesthesia Indication(s): Analgesia and Anxiety Route: Intravenous (IV) IV Access: Secured Sedation: Meaningful verbal contact was maintained at all times during the procedure  Local Anesthetic: Lidocaine 1-2%  Position: Prone   Indications: 1. Chronic shoulder pain (Primary Source of Pain) (Bilateral) (R>L)   2. Osteoarthritis of shoulder (Bilateral)   3. S/P total replacement of shoulder (Right)    Danielle [name] has been dealing with the above chronic pain for longer than three months and has either failed to respond, was unable to tolerate, or simply did not get enough benefit from other more conservative therapies including, but not limited to: 1. Over-the-counter medications 2. Anti-inflammatory medications 3. Muscle relaxants 4. Membrane stabilizers 5. Opioids 6. Physical therapy and/or chiropractic manipulation 7. Modalities (Heat, ice, etc.) 8. Invasive techniques such as nerve blocks. [name] has attained more than 50% relief of the pain from a series of diagnostic injections conducted in separate occasions.  Pain Score: Pre-procedure:  /10 Post-procedure:  /10  Pre-op Assessment:  Danielle [name] is a 58 y.o. (year old), female patient, seen today for interventional treatment. She  has a past surgical history that includes necksurgery (10/21/2014); Back surgery (2013); Foot surgery (Right); Elbow surgery (Left); Carpal tunnel release (Right); Nose surgery; Partial hysterectomy; Total shoulder replacement (Bilateral); and Colonoscopy with propofol (N/A, 02/05/2017). Danielle [name] has a current medication list which includes the following prescription(s): albuterol, cetirizine, cholecalciferol, citalopram, diclofenac sodium, docusate sodium, fexofenadine, levothyroxine, magnesium hydroxide, quad cane, naloxone, vios lc sprint deluxe, nystatin, oxycodone hcl, oxycodone hcl, oxycodone hcl, spiriva handihaler, and tizanidine. Her primarily concern today is the No chief complaint on file.  Initial Vital Signs:  Pulse/HCG Rate:    Temp:   Resp:   BP:   SpO2:    BMI: Estimated body mass index is 40.27 kg/m as calculated from the following:   Height as of 07/23/18: 5\' 5"  (1.651 m).   Weight as of 07/23/18: 242 lb (109.8 kg).  Risk Assessment: Allergies: Reviewed. She is allergic to aripiprazole; duloxetine; gabapentin; gold; iodinated diagnostic agents; naproxen sodium; nortriptyline hcl; nsaids; pregabalin; meperidine; ace inhibitors; aspirin; cefpodoxime; fluoxetine; fluticasone-salmeterol; levofloxacin; lithium; paroxetine; tape; telithromycin; theophylline; topiramate; trazodone; triamcinolone; venlafaxine; bupropion; ketorolac tromethamine; morphine; and moxifloxacin.  Allergy Precautions: No iodine containing solutions or radiological contrast used. Coagulopathies: Reviewed. None identified.  Blood-thinner therapy: None at this time Active Infection(s): Reviewed. None identified. Danielle [name] is afebrile  Site Confirmation: Danielle [name] was asked to confirm the procedure and laterality before marking the site Procedure checklist: Completed Consent: Before the procedure and under the influence of no sedative(s), amnesic(s), or anxiolytics, the patient was informed of  the treatment options, risks and possible complications. To fulfill our ethical and legal obligations, as recommended by the American Medical Association's Code of Ethics, I have informed the patient of my clinical impression; the nature and purpose of the treatment or procedure; the risks, benefits, and possible complications of the intervention; the alternatives, including doing nothing; the risk(s) and benefit(s) of the alternative treatment(s) or procedure(s); and the risk(s) and benefit(s) of doing nothing. The patient was provided information about the general risks and possible complications associated with the procedure. These may include, but are not limited to: failure to achieve desired goals, infection, bleeding, organ or nerve damage, allergic reactions, paralysis, and death. In addition, the patient was informed of those risks and complications associated to the procedure, such as failure to decrease pain; infection; bleeding; organ or nerve damage with subsequent damage to sensory, motor, and/or autonomic systems, resulting in permanent pain, numbness, and/or weakness of one or several areas of the body; allergic reactions; (i.e.: anaphylactic reaction); and/or death. Furthermore, the patient was informed of those risks and complications associated with the medications. These include, but are not limited to: allergic reactions (i.e.: anaphylactic or anaphylactoid reaction(s)); adrenal axis suppression; blood sugar elevation that in diabetics may result in ketoacidosis or comma; water retention that in patients with history of congestive heart failure may result in shortness of breath, pulmonary edema, and  decompensation with resultant heart failure; weight gain; swelling or edema; medication-induced neural toxicity; particulate matter embolism and blood vessel occlusion with resultant organ, and/or nervous system infarction; and/or aseptic necrosis of one or more joints. Finally, the patient was informed that Medicine is not an exact science; therefore, there is also the possibility of unforeseen or unpredictable risks and/or possible complications that may result in a catastrophic outcome. The patient indicated having understood very clearly. We have given the patient no guarantees and we have made no promises. Enough time was given to the patient to ask questions, all of which were answered to the patient's satisfaction. Danielle [name] has indicated that she wanted to continue with the procedure. Attestation: I, the ordering provider, attest that I have discussed with the patient the benefits, risks, side-effects, alternatives, likelihood of achieving goals, and potential problems during recovery for the procedure that I have provided informed consent. Date  Time: {CHL [facility]-PAIN TIME CHOICES:21018001}  Pre-Procedure Preparation:  Monitoring: As per clinic protocol. Respiration, ETCO2, SpO2, BP, heart rate and rhythm monitor placed and checked for adequate function Safety Precautions: Patient was assessed for positional comfort and pressure points before starting the procedure. Time-out: I initiated and conducted the "Time-out" before starting the procedure, as per protocol. The patient was asked to participate by confirming the accuracy of the "Time Out" information. Verification of the correct person, site, and procedure were performed and confirmed by me, the nursing staff, and the patient. "Time-out" conducted as per Joint Commission's Universal Protocol (UP.01.01.01). Time:    Description of Procedure:          Area Prepped: Entire shoulder and scapular area Prepping solution: Hibiclens (4.0%  Chlorhexidine gluconate solution) Safety Precautions: Aspiration looking for blood return was conducted prior to all injections. At no point did we inject any substances, as a needle was being advanced. No attempts were made at seeking any paresthesias. Safe injection practices and needle disposal techniques used. Medications properly checked for expiration dates. SDV (single dose vial) medications used. Description of the Procedure: Protocol guidelines were followed. The patient was placed in position over the procedure table. The  target area was identified and the area prepped in the usual manner. The skin and muscle were infiltrated with local anesthetic. Appropriate amount of time allowed to pass for local anesthetics to take effect. Radiofrequency needles were introduced to the target area using fluoroscopic guidance. Using the NeuroTherm NT1100 Radiofrequency Generator, sensory stimulation using 50 Hz was used to locate & identify the nerve, making sure that the needle was positioned such that there was no sensory stimulation below 0.3 V or above 0.7 V. Stimulation using 2 Hz was used to evaluate the motor component. Care was taken not to lesion any nerves that demonstrated motor stimulation of the lower extremities at an output of less than 2.5 times that of the sensory threshold, or a maximum of 2.0 V. Once satisfactory placement of the needles was achieved, the numbing solution was slowly injected after negative aspiration. After waiting for at least 2 minutes, the ablation was performed at 80 degrees C for 60 seconds, using regular Radiofrequency settings. Once the procedure was completed, the needles were then removed and the area cleansed, making sure to leave some of the prepping solution back to take advantage of its long term bactericidal properties. Intra-operative Compliance: Compliant   There were no vitals filed for this visit.  Start Time:   hrs. End Time:   hrs.  Materials &  Medications:  Needle(s) Type: Teflon-coated, curved tip, Radiofrequency needle(s) Gauge: 22G Length: 10cm Medication(s): Please see orders for medications and dosing details.  Imaging Guidance (Non-Spinal):          Type of Imaging Technique: Fluoroscopy Guidance (Non-Spinal) Indication(s): Assistance in needle guidance and placement for procedures requiring needle placement in or near specific anatomical locations not easily accessible without such assistance. Exposure Time: Please see nurses notes. Contrast: Before injecting any contrast, we confirmed that the patient did not have an allergy to iodine, shellfish, or radiological contrast. Once satisfactory needle placement was completed at the desired level, radiological contrast was injected. Contrast injected under live fluoroscopy. No contrast complications. See chart for type and volume of contrast used. Fluoroscopic Guidance: I was personally present during the use of fluoroscopy. "Tunnel Vision Technique" used to obtain the best possible view of the target area. Parallax error corrected before commencing the procedure. "Direction-depth-direction" technique used to introduce the needle under continuous pulsed fluoroscopy. Once target was reached, antero-posterior, oblique, and lateral fluoroscopic projection used confirm needle placement in all planes. Images permanently stored in EMR. Interpretation: I personally interpreted the imaging intraoperatively. Adequate needle placement confirmed in multiple planes. Appropriate spread of contrast into desired area was observed. No evidence of afferent or efferent intravascular uptake. Permanent images saved into the patient's record.  Antibiotic Prophylaxis:   Anti-infectives (From admission, onward)   None     Indication(s): None identified  Post-operative Assessment:  Post-procedure Vital Signs:  Pulse/HCG Rate:    Temp:   Resp:   BP:   SpO2:    EBL: None  Complications: No  immediate post-treatment complications observed by team, or reported by patient.  Note: The patient tolerated the entire procedure well. A repeat set of vitals were taken after the procedure and the patient was kept under observation following institutional policy, for this type of procedure. Post-procedural neurological assessment was performed, showing return to baseline, prior to discharge. The patient was provided with post-procedure discharge instructions, including a section on how to identify potential problems. Should any problems arise concerning this procedure, the patient was given instructions to immediately contact us, at any  time, without hesitation. In any case, we plan to contact the patient by telephone for a follow-up status report regarding this interventional procedure.  Comments:  No additional relevant information.  Plan of Care   Imaging Orders  No imaging studies ordered today   Procedure Orders    No procedure(s) ordered today    Medications ordered for procedure: No orders of the defined types were placed in this encounter.  Medications administered: Danielle [name] "ANGIE" had no medications administered during this visit.  See the medical record for exact dosing, route, and time of administration.  Disposition: Discharge home  Discharge Date & Time: 09/01/2018;   hrs.   Physician-requested Follow-up: No follow-ups on file.  Future Appointments  Date Time Provider [place]  09/01/2018 10:30 AM Milinda Pointer, MD [facility] None  10/21/2018  1:45 PM Vevelyn Francois, NP [facility] None   Primary Care Physician: Marygrace Drought, MD Location: [facility] Note by: Gaspar Cola, MD Date: 09/01/2018; Time: 7:14 AM  Disclaimer:  Medicine is not an Chief Strategy Officer. The only guarantee in medicine is that nothing is guaranteed. It is important to note that the decision to proceed with this intervention was based on the  information collected from the patient. The Data and conclusions were drawn from the patient's questionnaire, the interview, and the physical examination. Because the information was provided in large part by the patient, it cannot be guaranteed that it has not been purposely or unconsciously manipulated. Every effort has been made to obtain as much relevant data as possible for this evaluation. It is important to note that the conclusions that lead to this procedure are derived in large part from the available data. Always take into account that the treatment will also be dependent on availability of resources and existing treatment guidelines, considered by other Pain Management Practitioners as being common knowledge and practice, at the time of the intervention. For Medico-Legal purposes, it is also important to point out that variation in procedural techniques and pharmacological choices are the acceptable norm. The indications, contraindications, technique, and results of the above procedure should only be interpreted and judged by a Board-Certified Interventional Pain Specialist with extensive familiarity and expertise in the same exact procedure and technique.

## 2018-09-21 ENCOUNTER — Telehealth: Payer: Self-pay

## 2018-09-21 NOTE — Telephone Encounter (Signed)
Patient called and said we need to do the prior auth for her oxycodone again this month.

## 2018-09-21 NOTE — Telephone Encounter (Signed)
PA sent

## 2018-10-21 ENCOUNTER — Ambulatory Visit: Payer: Medicaid Other | Attending: Nurse Practitioner | Admitting: Nurse Practitioner

## 2018-10-21 ENCOUNTER — Other Ambulatory Visit: Payer: Self-pay

## 2018-10-21 ENCOUNTER — Telehealth: Payer: Self-pay | Admitting: *Deleted

## 2018-10-21 DIAGNOSIS — M47816 Spondylosis without myelopathy or radiculopathy, lumbar region: Secondary | ICD-10-CM | POA: Diagnosis not present

## 2018-10-21 DIAGNOSIS — G894 Chronic pain syndrome: Secondary | ICD-10-CM

## 2018-10-21 DIAGNOSIS — M62830 Muscle spasm of back: Secondary | ICD-10-CM

## 2018-10-21 DIAGNOSIS — M25512 Pain in left shoulder: Secondary | ICD-10-CM

## 2018-10-21 DIAGNOSIS — G8929 Other chronic pain: Secondary | ICD-10-CM

## 2018-10-21 DIAGNOSIS — G9520 Unspecified cord compression: Secondary | ICD-10-CM

## 2018-10-21 DIAGNOSIS — M25511 Pain in right shoulder: Secondary | ICD-10-CM

## 2018-10-21 MED ORDER — OXYCODONE HCL 10 MG PO TABS: 10.0000 mg | ORAL_TABLET | Freq: Four times a day (QID) | ORAL | 0 refills | Status: DC | PRN

## 2018-10-21 MED ORDER — TIZANIDINE HCL 4 MG PO TABS: 4.0000 mg | ORAL_TABLET | Freq: Three times a day (TID) | ORAL | 0 refills | Status: DC | PRN

## 2018-10-21 NOTE — Progress Notes (Signed)
Pain Management Encounter Note - Virtual Visit via Telephone Telehealth (real-time audio visits between healthcare provider and patient).  Patient's Phone No. & Preferred Pharmacy:  [phone] (home); [phone] (mobile); (Preferred) [facility] [place] [address] [place] [address] Phone: [phone] Fax: [phone]   Pre-screening note:  Our staff contacted Danielle [name] and offered her an "in person", "face-to-face" appointment versus a telephone encounter. She indicated preferring the telephone encounter, at this time.  Reason for Virtual Visit: COVID-19*  Social distancing based on CDC and AMA recommendations.   I contacted Danielle [name] on 10/21/2018 at 10:00 AM by telephone and clearly identified myself as Dionisio David, NP. I verified that I was speaking with the correct person using two identifiers (Name and date of birth: 27-May-1961).  Advanced Informed Consent I sought verbal advanced consent from Danielle [name] for telemedicine interactions and virtual visit. I informed Danielle [name] of the security and privacy concerns, risks, and limitations associated with performing an evaluation and management service by telephone. I also informed Danielle [name] of the availability of "in person" appointments and I informed her of the possibility of a patient responsible charge related to this service. Danielle [name] expressed understanding and agreed to proceed.   Historic Elements   Danielle [name] is a 59 y.o. year old, female patient evaluated today after her last encounter by our practice on 09/21/2018. Danielle [name]  has a past medical history of Acute postoperative pain (01/13/2017), Anxiety, Arthritis, Asthma, Bell's palsy, Bursitis, COPD (chronic obstructive pulmonary disease) ([place]), Depression, Fibromyalgia, Heart murmur, Hepatitis C, Hiatal hernia, Hyperlipidemia, Hypertension, IBS (irritable bowel syndrome), Insomnia, and Thyroid  disease. She also  has a past surgical history that includes necksurgery (10/21/2014); Back surgery (2013); Foot surgery (Right); Elbow surgery (Left); Carpal tunnel release (Right); Nose surgery; Partial hysterectomy; Total shoulder replacement (Bilateral); and Colonoscopy with propofol (N/A, 02/05/2017). Danielle [name] has a current medication list which includes the following prescription(s): albuterol, cetirizine, cholecalciferol, citalopram, diclofenac sodium, docusate sodium, fexofenadine, levothyroxine, magnesium hydroxide, quad cane, naloxone, vios lc sprint deluxe, nystatin, oxycodone hcl, oxycodone hcl, oxycodone hcl, spiriva handihaler, and tizanidine. She  reports that she has been smoking cigarettes. She has a 40.00 pack-year smoking history. She has never used smokeless tobacco. She reports that she does not drink alcohol or use drugs. Danielle [name] is allergic to aripiprazole; duloxetine; gabapentin; gold; iodinated diagnostic agents; naproxen sodium; nortriptyline hcl; nsaids; pregabalin; meperidine; ace inhibitors; aspirin; cefpodoxime; fluoxetine; fluticasone-salmeterol; levofloxacin; lithium; paroxetine; tape; telithromycin; theophylline; topiramate; trazodone; triamcinolone; venlafaxine; bupropion; ketorolac tromethamine; morphine; and moxifloxacin.   HPI  I last saw her on 07/23/2018. She is being evaluated for medication management. She rates her pain 4/10. She continues to shoulder pain. She admits that she has been sick for several months. She has been on several antibiotics and is currently on Levaquin. She has increased pain secondary to coughing. She just wants to get better.  Pharmacotherapy Assessment  Analgesic:Oxycodone IR 10 mg every 6 hours (40 mg/dayof oxycodone) MME/day:60mg /day Monitoring: Pharmacotherapy: No side-effects or adverse reactions reported. [place] PMP: PDMP reviewed during this encounter.       Compliance: No problems identified. Plan: Refer to "POC".  Review of  recent tests  DG C-Arm 1-60 Min-No Report Fluoroscopy was utilized by the requesting physician.  No radiographic  interpretation.    Office Visit on 05/13/2018  Component Date Value Ref Range Status  . WBC 05/13/2018 11.1* 3.4 -  10.8 x10E3/uL Final  . RBC 05/13/2018 4.51  3.77 - 5.28 x10E6/uL Final  . Hemoglobin 05/13/2018 13.9  11.1 - 15.9 g/dL Final  . Hematocrit 05/13/2018 41.8  34.0 - 46.6 % Final  . MCV 05/13/2018 93  79 - 97 fL Final  . MCH 05/13/2018 30.8  26.6 - 33.0 pg Final  . MCHC 05/13/2018 33.3  31.5 - 35.7 g/dL Final  . RDW 05/13/2018 12.2* 12.3 - 15.4 % Final  . Platelets 05/13/2018 295  150 - 450 x10E3/uL Final  . Neutrophils 05/13/2018 60  Not Estab. % Final  . Lymphs 05/13/2018 29  Not Estab. % Final  . Monocytes 05/13/2018 7  Not Estab. % Final  . Eos 05/13/2018 3  Not Estab. % Final  . Basos 05/13/2018 1  Not Estab. % Final  . Neutrophils Absolute 05/13/2018 6.7  1.4 - 7.0 x10E3/uL Final  . Lymphocytes Absolute 05/13/2018 3.2* 0.7 - 3.1 x10E3/uL Final  . Monocytes Absolute 05/13/2018 0.8  0.1 - 0.9 x10E3/uL Final  . EOS (ABSOLUTE) 05/13/2018 0.3  0.0 - 0.4 x10E3/uL Final  . Basophils Absolute 05/13/2018 0.1  0.0 - 0.2 x10E3/uL Final  . Immature Granulocytes 05/13/2018 0  Not Estab. % Final  . Immature Grans (Abs) 05/13/2018 0.0  0.0 - 0.1 x10E3/uL Final   Assessment  The primary encounter diagnosis was Chronic shoulder pain (Primary Source of Pain) (Bilateral) (R>L). Diagnoses of Chronic pain syndrome, Muscle spasm of back, Lumbar spondylosis, and Cervical spinal cord compression [facility]) (April, 2017) were also pertinent to this visit.  Plan of Care  I have changed Danielle [name] "ANGIE"'s Oxycodone HCl, Oxycodone HCl, and Oxycodone HCl. I am also having her maintain her magnesium hydroxide, nystatin, levothyroxine, Spiriva HandiHaler, naloxone, cetirizine, albuterol, docusate sodium, citalopram, cholecalciferol, fexofenadine, Vios LC Sprint Deluxe, [employer],  diclofenac sodium, and tiZANidine.  Pharmacotherapy (Medications Ordered): Meds ordered this encounter  Medications  . Oxycodone HCl 10 MG TABS    Sig: Take 1 tablet (10 mg total) by mouth every 6 (six) hours as needed for up to 30 days.    Dispense:  120 tablet    Refill:  0    Do not add this medication to the electronic "Automatic Refill" notification system. Patient may have prescription filled one day early if pharmacy is closed on scheduled refill date.    Order Specific Question:   Supervising Provider    Answer:   Milinda Pointer [phone]  . Oxycodone HCl 10 MG TABS    Sig: Take 1 tablet (10 mg total) by mouth every 6 (six) hours as needed for up to 30 days.    Dispense:  120 tablet    Refill:  0    Do not add this medication to the electronic "Automatic Refill" notification system. Patient may have prescription filled one day early if pharmacy is closed on scheduled refill date.    Order Specific Question:   Supervising Provider    Answer:   Milinda Pointer [phone]  . Oxycodone HCl 10 MG TABS    Sig: Take 1 tablet (10 mg total) by mouth every 6 (six) hours as needed for up to 30 days.    Dispense:  120 tablet    Refill:  0    Do not add this medication to the electronic "Automatic Refill" notification system. Patient may have prescription filled one day early if pharmacy is closed on scheduled refill date.    Order Specific Question:   Supervising Provider  AnswerMilinda Pointer [[id number]]  . tiZANidine (ZANAFLEX) 4 MG tablet    Sig: Take 1 tablet (4 mg total) by mouth every 8 (eight) hours as needed for muscle spasms.    Dispense:  270 tablet    Refill:  0    Do not place this medication, or any other prescription from our practice, on "Automatic Refill". Patient may have prescription filled one day early if pharmacy is closed on scheduled refill date.    Order Specific Question:   Supervising Provider    Answer:   Milinda Pointer [phone]   Orders:  No  orders of the defined types were placed in this encounter.  Follow-up plan:   Return in about 3 months (around 01/20/2019) for MedMgmt.   I discussed the assessment and treatment plan with the patient. The patient was provided an opportunity to ask questions and all were answered. The patient agreed with the plan and demonstrated an understanding of the instructions.  Patient advised to call back or seek an in-person evaluation if the symptoms or condition worsens.  Total duration of non-face-to-face encounter: 15 minutes.  Note by: Dionisio David, NP Date: 10/21/2018; Time: 10:15 AM  Disclaimer:  * Given the special circumstances of the COVID-19 pandemic, the federal government has announced that the Office for Civil Rights (OCR) will exercise its enforcement discretion and will not impose penalties on physicians using telehealth in the event of noncompliance with regulatory requirements under the [place] (HIPAA) in connection with the good faith provision of telehealth during the FGBMS-11 national public health emergency. ([place])

## 2019-01-19 ENCOUNTER — Encounter: Payer: Self-pay | Admitting: Pain Medicine

## 2019-01-20 ENCOUNTER — Ambulatory Visit: Payer: Medicaid Other | Attending: Nurse Practitioner | Admitting: Pain Medicine

## 2019-01-20 ENCOUNTER — Other Ambulatory Visit: Payer: Self-pay

## 2019-01-20 DIAGNOSIS — Z79899 Other long term (current) drug therapy: Secondary | ICD-10-CM

## 2019-01-20 DIAGNOSIS — M150 Primary generalized (osteo)arthritis: Secondary | ICD-10-CM

## 2019-01-20 DIAGNOSIS — G894 Chronic pain syndrome: Secondary | ICD-10-CM | POA: Diagnosis not present

## 2019-01-20 DIAGNOSIS — M25512 Pain in left shoulder: Secondary | ICD-10-CM

## 2019-01-20 DIAGNOSIS — M961 Postlaminectomy syndrome, not elsewhere classified: Secondary | ICD-10-CM

## 2019-01-20 DIAGNOSIS — M25551 Pain in right hip: Secondary | ICD-10-CM | POA: Diagnosis not present

## 2019-01-20 DIAGNOSIS — M899 Disorder of bone, unspecified: Secondary | ICD-10-CM

## 2019-01-20 DIAGNOSIS — M5441 Lumbago with sciatica, right side: Secondary | ICD-10-CM

## 2019-01-20 DIAGNOSIS — M62830 Muscle spasm of back: Secondary | ICD-10-CM

## 2019-01-20 DIAGNOSIS — M47816 Spondylosis without myelopathy or radiculopathy, lumbar region: Secondary | ICD-10-CM

## 2019-01-20 DIAGNOSIS — M7918 Myalgia, other site: Secondary | ICD-10-CM

## 2019-01-20 DIAGNOSIS — M25552 Pain in left hip: Secondary | ICD-10-CM

## 2019-01-20 DIAGNOSIS — M25511 Pain in right shoulder: Secondary | ICD-10-CM | POA: Diagnosis not present

## 2019-01-20 DIAGNOSIS — G8929 Other chronic pain: Secondary | ICD-10-CM

## 2019-01-20 DIAGNOSIS — Z789 Other specified health status: Secondary | ICD-10-CM

## 2019-01-20 DIAGNOSIS — M159 Polyosteoarthritis, unspecified: Secondary | ICD-10-CM

## 2019-01-20 MED ORDER — OXYCODONE HCL 10 MG PO TABS: 10.0000 mg | ORAL_TABLET | Freq: Four times a day (QID) | ORAL | 0 refills | Status: DC | PRN

## 2019-01-20 MED ORDER — TIZANIDINE HCL 4 MG PO TABS: 4.0000 mg | ORAL_TABLET | Freq: Three times a day (TID) | ORAL | 0 refills | Status: DC | PRN

## 2019-01-20 NOTE — Progress Notes (Signed)
Pain Management Virtual Encounter Note - Virtual Visit via Telephone Telehealth (real-time audio visits between healthcare provider and patient).   Patient's Phone No. & Preferred Pharmacy:  [phone] (home); [phone] (mobile); (Preferred) [phone] [email or web address]  [place] [place] [address] [place] [address] Phone: [phone] Fax: [phone]    Pre-screening note:  Our staff contacted Ms. Bergeron and offered her an "in person", "face-to-face" appointment versus a telephone encounter. She indicated preferring the telephone encounter, at this time.   Reason for Virtual Visit: COVID-19*  Social distancing based on CDC and AMA recommendations.   I contacted [name] on 01/20/2019 via telephone.      I clearly identified myself as Gaspar Cola, MD. I verified that I was speaking with the correct person using two identifiers (Name: QUINA WILBOURNE, and date of birth: 05-25-1961).  Advanced Informed Consent I sought verbal advanced consent from [name] for virtual visit interactions. I informed Ms. Sisler of possible security and privacy concerns, risks, and limitations associated with providing "not-in-person" medical evaluation and management services. I also informed Ms. Geng of the availability of "in-person" appointments. Finally, I informed her that there would be a charge for the virtual visit and that she could be  personally, fully or partially, financially responsible for it. Ms. Whitmill expressed understanding and agreed to proceed.   Historic Elements   Ms. MAHEALANI SULAK is a 58 y.o. year old, female patient evaluated today after her last encounter by our practice on 10/21/2018. Ms. Anfinson  has a past medical history of Acute postoperative pain (01/13/2017), Anxiety, Arthritis, Asthma, Bell's palsy, Bursitis, COPD (chronic obstructive pulmonary disease) ([place]), Depression, Fibromyalgia, Heart  murmur, Hepatitis C, Hiatal hernia, Hyperlipidemia, Hypertension, IBS (irritable bowel syndrome), Insomnia, and Thyroid disease. She also  has a past surgical history that includes necksurgery (10/21/2014); Back surgery (2013); Foot surgery (Right); Elbow surgery (Left); Carpal tunnel release (Right); Nose surgery; Partial hysterectomy; Total shoulder replacement (Bilateral); and Colonoscopy with propofol (N/A, 02/05/2017). Ms. Searson has a current medication list which includes the following prescription(s): albuterol, cholecalciferol, citalopram, diclofenac sodium, docusate sodium, levothyroxine, loratadine, magnesium hydroxide, quad cane, naloxone, vios lc sprint deluxe, nystatin, oxycodone hcl, oxycodone hcl, oxycodone hcl, spiriva handihaler, tizanidine, cetirizine, and dulera. She  reports that she has been smoking cigarettes. She has a 40.00 pack-year smoking history. She has never used smokeless tobacco. She reports that she does not drink alcohol or use drugs. Ms. Liguori is allergic to aripiprazole; duloxetine; gabapentin; gold; iodinated diagnostic agents; naproxen sodium; nortriptyline hcl; nsaids; pregabalin; meperidine; ace inhibitors; aspirin; cefpodoxime; fluoxetine; fluticasone-salmeterol; levofloxacin; lithium; paroxetine; tape; telithromycin; theophylline; topiramate; trazodone; triamcinolone; venlafaxine; bupropion; ketorolac tromethamine; morphine; and moxifloxacin.   HPI  Today, she is being contacted for medication management.  Review of today's chart indicates that the patient needs to have her lab work updated.  The patient denies any problems with any of her medications except for the fact that the pharmacy told her that she could no longer get the Voltaren gel since it was something that she could get over-the-counter.  Apparently she is not able to afford the medication this way.  Today she is complaining of recurrence of her bilateral low back pain with the left side being worse than  the right.  She describes the pain as being primarily in the midline.  She also does have a lot of pain going down the left lower extremity into her foot.  She indicates that she has pain all over her foot.  She also admits that she had a fracture in that foot sometime ago and this could be unrelated to her back.  This patient had a bilateral lumbar facet radiofrequency ablation done in the past with the left side having been done on 02/12/2018 and the right side on 12/16/2017.  She did get good benefit from this and it was not until now that it started hurting again.  She is also having a lot of pain in her left shoulder and she would like to have the suprascapular nerve block again since it did help her quite a bit.  The last time that she had 1 of those done was on June 12, 2018.  She is interested in looking at the possibility of doing a radiofrequency ablation of the shoulder.  To help her with her immediate pain we will bring her in to do a bilateral lumbar facet block under fluoroscopic guidance and IV sedation to determine if her radiofrequencies have truly worn off.  At the same time we will go ahead and tackle the problem of her left shoulder and will do a suprascapular nerve block #3, also under fluoroscopic guidance and IV sedation.  If she again gets good relief of the pain, then we will consider radiofrequency ablation.  Pharmacotherapy Assessment  Analgesic: Oxycodone IR 10 mg, 1 tab PO q 6 hrs (40 mg/dayof oxycodone) MME/day:60mg /day.   Monitoring: Pharmacotherapy: No side-effects or adverse reactions reported.  PMP: PDMP reviewed during this encounter.       Compliance: No problems identified. Effectiveness: Clinically acceptable. Plan: Refer to "POC".  Pertinent Labs   SAFETY SCREENING Profile No results found for: SARSCOV2NAA, COVIDSOURCE, STAPHAUREUS, MRSAPCR, HCVAB, HIV, PREGTESTUR Renal Function Lab Results  Component Value Date   BUN 8 01/08/2016   CREATININE 0.94  01/08/2016   GFRAA >60 01/08/2016   GFRNONAA >60 01/08/2016   Hepatic Function Lab Results  Component Value Date   AST 18 01/08/2016   ALT 14 01/08/2016   ALBUMIN 3.7 01/08/2016   UDS Summary  Date Value Ref Range Status  04/21/2018 FINAL  Final    Comment:    ==================================================================== TOXASSURE SELECT 13 (MW) ==================================================================== Test                             Result       Flag       Units Drug Present and Declared for Prescription Verification   Oxycodone                      2455         EXPECTED   ng/mg creat   Oxymorphone                    189          EXPECTED   ng/mg creat   Noroxycodone                   2758         EXPECTED   ng/mg creat    Sources of oxycodone include scheduled prescription medications.    Oxymorphone and noroxycodone are expected metabolites of    oxycodone. Oxymorphone is also available as a scheduled    prescription medication. ==================================================================== Test  Result    Flag   Units      Ref Range   Creatinine              84               mg/dL      >=20 ==================================================================== Declared Medications:  The flagging and interpretation on this report are based on the  following declared medications.  Unexpected results may arise from  inaccuracies in the declared medications.  **Note: The testing scope of this panel includes these medications:  Oxycodone  **Note: The testing scope of this panel does not include following  reported medications:  Albuterol  Cetirizine  Cholecalciferol  Diclofenac  Docusate  Levothyroxine  Magnesium hydroxide  Naloxone  Nystatin  Tiotropium  Tizanidine ==================================================================== For clinical consultation, please call [phone]. ====================================================================    Note: Above Lab results reviewed.  Recent imaging  DG C-Arm 1-60 Min-No Report Fluoroscopy was utilized by the requesting physician.  No radiographic  interpretation.   Assessment  The primary encounter diagnosis was Chronic pain syndrome. Diagnoses of Chronic shoulder pain (Primary Area of Pain) (Bilateral) (R>L), Chronic hip pain (Secondary area of Pain) (Bilateral) (R>L), Chronic low back pain (Third area of Pain) (Bilateral) (R>L), Lumbar facet syndrome (Bilateral) (R>L), Failed back surgical syndrome, Muscle spasm of back, Osteoarthritis involving multiple joints, Chronic musculoskeletal pain, Pharmacologic therapy, Disorder of skeletal system, and Problems influencing health status were also pertinent to this visit.  Plan of Care  I have discontinued Ladye A. Brearley "ANGIE"'s fexofenadine, Oxycodone HCl, and Oxycodone HCl. I have also changed her tiZANidine and Oxycodone HCl. Additionally, I am having her start on Oxycodone HCl and Oxycodone HCl. Lastly, I am having her maintain her magnesium hydroxide, nystatin, levothyroxine, Spiriva HandiHaler, naloxone, cetirizine, albuterol, docusate sodium, citalopram, cholecalciferol, Vios LC Sprint Deluxe, [employer], diclofenac sodium, loratadine, and Dulera.  Pharmacotherapy (Medications Ordered): Meds ordered this encounter  Medications  . tiZANidine (ZANAFLEX) 4 MG tablet    Sig: Take 1 tablet (4 mg total) by mouth every 8 (eight) hours as needed for muscle spasms. Max: 3/day    Dispense:  270 tablet    Refill:  0    Fill one day early if pharmacy is closed on scheduled refill date. May substitute for generic if available.  . Oxycodone HCl 10 MG TABS    Sig: Take 1 tablet (10 mg total) by mouth every 6 (six) hours as needed. Must last 30 days    Dispense:  120 tablet    Refill:  0    Chronic Pain: STOP Act (Not applicable) Fill 1 day early if closed on  refill date. Do not fill until: 01/20/2019. To last until: 02/19/2019. Avoid benzodiazepines within 8 hours of opioids  . Oxycodone HCl 10 MG TABS    Sig: Take 1 tablet (10 mg total) by mouth every 6 (six) hours as needed. Must last 30 days    Dispense:  120 tablet    Refill:  0    Chronic Pain: STOP Act (Not applicable) Fill 1 day early if closed on refill date. Do not fill until: 02/19/2019. To last until: 03/21/2019. Avoid benzodiazepines within 8 hours of opioids  . Oxycodone HCl 10 MG TABS    Sig: Take 1 tablet (10 mg total) by mouth every 6 (six) hours as needed. Must last 30 days    Dispense:  120 tablet    Refill:  0    Chronic Pain: STOP Act (Not  applicable) Fill 1 day early if closed on refill date. Do not fill until: 03/21/2019. To last until: 04/20/2019. Avoid benzodiazepines within 8 hours of opioids   Orders:  Orders Placed This Encounter  Procedures  . LUMBAR FACET(MEDIAL BRANCH NERVE BLOCK) MBNB    Standing Status:   Future    Standing Expiration Date:   02/20/2019    Scheduling Instructions:     Side: Bilateral     Level: L3-4, L4-5, & L5-S1 Facets (L2, L3, L4, L5, & S1 Medial Branch Nerves)     Sedation: Patient's choice.     Timeframe: ASAA    Order Specific Question:   Where will this procedure be performed?    Answer:   [facility]  . SUPRASCAPULAR NERVE BLOCK    For shoulder pain.    Standing Status:   Future    Standing Expiration Date:   02/19/2019    Scheduling Instructions:     Purpose: Therapeutic     Laterality: Left-sided     Level(s): Suprascapular notch     Sedation: With Sedation.     Scheduling Timeframe: As permitted by the schedule    Order Specific Question:   Where will this procedure be performed?    Answer:   [facility]  . ToxASSURE Select 13 (MW), Urine    Volume: 30 ml(s). Minimum 3 ml of urine is needed. Document temperature of fresh sample. Indications: Long term (current) use of opiate analgesic (Z79.891)  . Comp. Metabolic  Panel (12)    With GFR. Indications: Chronic Pain Syndrome (G89.4) & Pharmacotherapy (P38.250)    Order Specific Question:   Has the patient fasted?    Answer:   No    Order Specific Question:   CC Results    Answer:   PCP-NURSE [[id number]]  . Magnesium    Indication: Pharmacologic therapy (H41.937)    Order Specific Question:   CC Results    Answer:   PCP-NURSE [[id number]]  . Vitamin B12    Indication: Pharmacologic therapy (B35.329).    Order Specific Question:   CC Results    Answer:   PCP-NURSE [[id number]]  . Sedimentation rate    Indication: Disorder of skeletal system (M89.9)    Order Specific Question:   CC Results    Answer:   PCP-NURSE [[id number]]  . 25-Hydroxyvitamin D Lcms D2+D3    Indication: Disorder of skeletal system (M89.9).    Order Specific Question:   CC Results    Answer:   PCP-NURSE [[id number]]  . C-reactive protein    Indication: Problems influencing health status (Z78.9)    Order Specific Question:   CC Results    Answer:   PCP-NURSE [[id number]]   Follow-up plan:   Return in about 12 weeks (around 04/14/2019) for (VV), E/M (MM), in addition, Procedure (w/ sedation): (B) L-FCT Blk + (L) SSNB #3.     Considering: Palliative bilateralsuprascapular nerve block Possible bilateral suprascapular nerveRFA Diagnostic bilateral intra-articular hip joint injection #4 (last done on 04/30/2018) Possible bilateral hip jointRFA Diagnostic bilateral lumbar facet block Possible bilaterallumbar facet RFA Diagnostic caudal epiduralsteroid injection + diagnostic epidurogram  Possible Racz procedure.  Diagnostic bilateral cervical facet block Possible bilateral cervical facetRFA Diagnostic right-sided cervical epidural steroid injection  Diagnostic bilateral sacroiliac joint block Possible bilateral sacroiliac joint RFA   Palliative PRN treatment(s): Palliative bilateral trochanteric bursa #2  Palliative bilateralsuprascapular nerve block Diagnostic bilateral  intra-articular hip joint injection Diagnostic bilateral lumbar facet block Palliative right-sided lumbar facet RFA #2(last  done on 12/16/2017) Palliative left-sided lumbar facet RFA #2(last done on 02/12/2018) Diagnostic caudal epiduralsteroid injection + diagnostic epidurogram  Diagnostic bilateral cervical facet block Diagnostic right-sided cervical epidural steroid injection  Diagnostic bilateral sacroiliac joint block    Recent Visits No visits were found meeting these conditions.  Showing recent visits within past 90 days and meeting all other requirements   Today's Visits Date Type Provider Dept  01/20/19 Office Visit Milinda Pointer, MD [facility]  Showing today's visits and meeting all other requirements   Future Appointments Date Type Provider Dept  04/07/19 Appointment Milinda Pointer, MD [facility]  Showing future appointments within next 90 days and meeting all other requirements   I discussed the assessment and treatment plan with the patient. The patient was provided an opportunity to ask questions and all were answered. The patient agreed with the plan and demonstrated an understanding of the instructions.  Patient advised to call back or seek an in-person evaluation if the symptoms or condition worsens.  Total duration of non-face-to-face encounter: 16 minutes.  Note by: Gaspar Cola, MD Date: 01/20/2019; Time: 12:42 PM  Note: This dictation was prepared with Dragon dictation. Any transcriptional errors that may result from this process are unintentional.  Disclaimer:  * Given the special circumstances of the COVID-19 pandemic, the federal government has announced that the Office for Civil Rights (OCR) will exercise its enforcement discretion and will not impose penalties on physicians using telehealth in the event of noncompliance with regulatory requirements under the [place] (HIPAA)  in connection with the good faith provision of telehealth during the IDCVU-13 national public health emergency. ([place])

## 2019-01-20 NOTE — Patient Instructions (Signed)

## 2019-02-15 NOTE — Progress Notes (Signed)
Patient's Name: Danielle [name]  MRN: [id number]  Referring Provider: Marygrace Drought, MD  DOB: 06-May-1961  PCP: Marygrace Drought, MD  DOS: 02/16/2019  Note by: Gaspar Cola, MD  Service setting: Ambulatory outpatient  Specialty: Interventional Pain Management  Patient type: Established  Location: [facility] (AMB) Pain Management Facility  Visit type: Interventional Procedure   Primary Reason for Visit: Interventional Pain Management Treatment. CC: Back Pain  Procedure #1:  Anesthesia, Analgesia, Anxiolysis:  Type: Lumbar Facet, Medial Branch Block(s)          Primary Purpose: Diagnostic Region: Posterolateral Lumbosacral Spine Level: L2, L3, L4, L5, & S1 Medial Branch Level(s). Injecting these levels blocks the L3-4, L4-5, and L5-S1 lumbar facet joints. Laterality: Bilateral  Type: Moderate (Conscious) Sedation combined with Local Anesthesia Indication(s): Analgesia and Anxiety Route: Intravenous (IV) IV Access: Secured Sedation: Meaningful verbal contact was maintained at all times during the procedure  Local Anesthetic: Lidocaine 1-2%  Position: Prone   Indications: 1. Lumbar facet syndrome (Bilateral) (R>L)   2. Spondylosis without myelopathy or radiculopathy, lumbosacral region   3. Failed back surgical syndrome   4. Lumbar spondylosis    Procedure #2:  Anesthesia, Analgesia, Anxiolysis:  Type: Diagnostic Suprascapular nerve Block          Primary Purpose: Diagnostic Region: Posterior Shoulder & Scapular Areas Level: Superior to the scapular spine, in the lateral aspect of the supraspinatus fossa (Suprescapular notch). Target Area: Suprascapular nerve as it passes thru the lower portion of the suprascapular notch. Approach: Posterior percutaneous approach. Laterality: Left-Side  See above.   Indications: 1. Chronic shoulder pain (Primary Area of Pain) (Bilateral) (R>L)   2. Osteoarthritis of shoulder (Bilateral)    Pain Score: Pre-procedure: 5 /10 Post-procedure: 0-No  pain/10   Pre-op Assessment:  Danielle [name] is a 58 y.o. (year old), female patient, seen today for interventional treatment. She  has a past surgical history that includes necksurgery (10/21/2014); Back surgery (2013); Foot surgery (Right); Elbow surgery (Left); Carpal tunnel release (Right); Nose surgery; Partial hysterectomy; Total shoulder replacement (Bilateral); and Colonoscopy with propofol (N/A, 02/05/2017). Danielle [name] has a current medication list which includes the following prescription(s): albuterol, cholecalciferol, citalopram, diclofenac sodium, docusate sodium, dulera, levothyroxine, loratadine, magnesium hydroxide, quad cane, naloxone, vios lc sprint deluxe, nystatin, oxycodone hcl, oxycodone hcl, oxycodone hcl, spiriva handihaler, tizanidine, and cetirizine, and the following Facility-Administered Medications: fentanyl and midazolam. Her primarily concern today is the Back Pain  Initial Vital Signs:  Pulse/HCG Rate: 87ECG Heart Rate: 85 Temp: 98.8 F (37.1 C) Resp: 15 BP: 132/84 SpO2: 96 %  BMI: Estimated body mass index is 39.94 kg/m as calculated from the following:   Height as of this encounter: 5\' 5"  (1.651 m).   Weight as of this encounter: 240 lb (108.9 kg).  Risk Assessment: Allergies: Reviewed. She is allergic to aripiprazole; duloxetine; gabapentin; gold; iodinated diagnostic agents; naproxen sodium; nortriptyline hcl; nsaids; pregabalin; meperidine; ace inhibitors; aspirin; cefpodoxime; fluoxetine; fluticasone-salmeterol; levofloxacin; lithium; paroxetine; tape; telithromycin; theophylline; topiramate; trazodone; triamcinolone; venlafaxine; bupropion; ketorolac tromethamine; morphine; and moxifloxacin.  Allergy Precautions: None required Coagulopathies: Reviewed. None identified.  Blood-thinner therapy: None at this time Active Infection(s): Reviewed. None identified. Danielle [name] is afebrile  Site Confirmation: Danielle [name] was asked to confirm the procedure and  laterality before marking the site Procedure checklist: Completed Consent: Before the procedure and under the influence of no sedative(s), amnesic(s), or anxiolytics, the patient was informed of the treatment options, risks and possible complications. To fulfill our ethical and legal obligations,  as recommended by the American Medical Association's Code of Ethics, I have informed the patient of my clinical impression; the nature and purpose of the treatment or procedure; the risks, benefits, and possible complications of the intervention; the alternatives, including doing nothing; the risk(s) and benefit(s) of the alternative treatment(s) or procedure(s); and the risk(s) and benefit(s) of doing nothing. The patient was provided information about the general risks and possible complications associated with the procedure. These may include, but are not limited to: failure to achieve desired goals, infection, bleeding, organ or nerve damage, allergic reactions, paralysis, and death. In addition, the patient was informed of those risks and complications associated to Spine-related procedures, such as failure to decrease pain; infection (i.e.: Meningitis, epidural or intraspinal abscess); bleeding (i.e.: epidural hematoma, subarachnoid hemorrhage, or any other type of intraspinal or peri-dural bleeding); organ or nerve damage (i.e.: Any type of peripheral nerve, nerve root, or spinal cord injury) with subsequent damage to sensory, motor, and/or autonomic systems, resulting in permanent pain, numbness, and/or weakness of one or several areas of the body; allergic reactions; (i.e.: anaphylactic reaction); and/or death. Furthermore, the patient was informed of those risks and complications associated with the medications. These include, but are not limited to: allergic reactions (i.e.: anaphylactic or anaphylactoid reaction(s)); adrenal axis suppression; blood sugar elevation that in diabetics may result in  ketoacidosis or comma; water retention that in patients with history of congestive heart failure may result in shortness of breath, pulmonary edema, and decompensation with resultant heart failure; weight gain; swelling or edema; medication-induced neural toxicity; particulate matter embolism and blood vessel occlusion with resultant organ, and/or nervous system infarction; and/or aseptic necrosis of one or more joints. Finally, the patient was informed that Medicine is not an exact science; therefore, there is also the possibility of unforeseen or unpredictable risks and/or possible complications that may result in a catastrophic outcome. The patient indicated having understood very clearly. We have given the patient no guarantees and we have made no promises. Enough time was given to the patient to ask questions, all of which were answered to the patient's satisfaction. Danielle [name] has indicated that she wanted to continue with the procedure. Attestation: I, the ordering provider, attest that I have discussed with the patient the benefits, risks, side-effects, alternatives, likelihood of achieving goals, and potential problems during recovery for the procedure that I have provided informed consent. Date   Time: 02/16/2019  9:40 AM  Pre-Procedure Preparation:  Monitoring: As per clinic protocol. Respiration, ETCO2, SpO2, BP, heart rate and rhythm monitor placed and checked for adequate function Safety Precautions: Patient was assessed for positional comfort and pressure points before starting the procedure. Time-out: I initiated and conducted the "Time-out" before starting the procedure, as per protocol. The patient was asked to participate by confirming the accuracy of the "Time Out" information. Verification of the correct person, site, and procedure were performed and confirmed by me, the nursing staff, and the patient. "Time-out" conducted as per Joint Commission's Universal Protocol (UP.01.01.01). Time:  1030  Description of Procedure #1:  Laterality: Bilateral. The procedure was performed in identical fashion on both sides. Levels:  L2, L3, L4, L5, & S1 Medial Branch Level(s) Area Prepped: Posterior Lumbosacral Region Prepping solution: DuraPrep (Iodine Povacrylex [0.7% available iodine] and Isopropyl Alcohol, 74% w/w) Safety Precautions: Aspiration looking for blood return was conducted prior to all injections. At no point did we inject any substances, as a needle was being advanced. Before injecting, the patient was told to immediately notify  me if she was experiencing any new onset of "ringing in the ears, or metallic taste in the mouth". No attempts were made at seeking any paresthesias. Safe injection practices and needle disposal techniques used. Medications properly checked for expiration dates. SDV (single dose vial) medications used. After the completion of the procedure, all disposable equipment used was discarded in the proper designated medical waste containers. Local Anesthesia: Protocol guidelines were followed. The patient was positioned over the fluoroscopy table. The area was prepped in the usual manner. The time-out was completed. The target area was identified using fluoroscopy. A 12-in long, straight, sterile hemostat was used with fluoroscopic guidance to locate the targets for each level blocked. Once located, the skin was marked with an approved surgical skin marker. Once all sites were marked, the skin (epidermis, dermis, and hypodermis), as well as deeper tissues (fat, connective tissue and muscle) were infiltrated with a small amount of a short-acting local anesthetic, loaded on a 10cc syringe with a 25G, 1.5-in  Needle. An appropriate amount of time was allowed for local anesthetics to take effect before proceeding to the next step. Local Anesthetic: Lidocaine 2.0% The unused portion of the local anesthetic was discarded in the proper designated containers. Technical explanation  of process:  L2 Medial Branch Nerve Block (MBB): The target area for the L2 medial branch is at the junction of the postero-lateral aspect of the superior articular process and the superior, posterior, and medial edge of the transverse process of L3. Under fluoroscopic guidance, a Quincke needle was inserted until contact was made with os over the superior postero-lateral aspect of the pedicular shadow (target area). After negative aspiration for blood, 0.5 mL of the nerve block solution was injected without difficulty or complication. The needle was removed intact. L3 Medial Branch Nerve Block (MBB): The target area for the L3 medial branch is at the junction of the postero-lateral aspect of the superior articular process and the superior, posterior, and medial edge of the transverse process of L4. Under fluoroscopic guidance, a Quincke needle was inserted until contact was made with os over the superior postero-lateral aspect of the pedicular shadow (target area). After negative aspiration for blood, 0.5 mL of the nerve block solution was injected without difficulty or complication. The needle was removed intact. L4 Medial Branch Nerve Block (MBB): The target area for the L4 medial branch is at the junction of the postero-lateral aspect of the superior articular process and the superior, posterior, and medial edge of the transverse process of L5. Under fluoroscopic guidance, a Quincke needle was inserted until contact was made with os over the superior postero-lateral aspect of the pedicular shadow (target area). After negative aspiration for blood, 0.5 mL of the nerve block solution was injected without difficulty or complication. The needle was removed intact. L5 Medial Branch Nerve Block (MBB): The target area for the L5 medial branch is at the junction of the postero-lateral aspect of the superior articular process and the superior, posterior, and medial edge of the sacral ala. Under fluoroscopic guidance,  a Quincke needle was inserted until contact was made with os over the superior postero-lateral aspect of the pedicular shadow (target area). After negative aspiration for blood, 0.5 mL of the nerve block solution was injected without difficulty or complication. The needle was removed intact. S1 Medial Branch Nerve Block (MBB): The target area for the S1 medial branch is at the posterior and inferior 6 o'clock position of the L5-S1 facet joint. Under fluoroscopic guidance, the Quincke  needle inserted for the L5 MBB was redirected until contact was made with os over the inferior and postero aspect of the sacrum, at the 6 o' clock position under the L5-S1 facet joint (Target area). After negative aspiration for blood, 0.5 mL of the nerve block solution was injected without difficulty or complication. The needle was removed intact.  Nerve block solution: 0.2% PF-Ropivacaine + Triamcinolone (40 mg/mL) diluted to a final concentration of 4 mg of Triamcinolone/mL of Ropivacaine The unused portion of the solution was discarded in the proper designated containers. Procedural Needles: 22-gauge, 3.5-inch, Quincke needles used for all levels.  Once the entire procedure was completed, the treated area was cleaned, making sure to leave some of the prepping solution back to take advantage of its long term bactericidal properties.   Illustration of the posterior view of the lumbar spine and the posterior neural structures. Laminae of L2 through S1 are labeled. DPRL5, dorsal primary ramus of L5; DPRS1, dorsal primary ramus of S1; DPR3, dorsal primary ramus of L3; FJ, facet (zygapophyseal) joint L3-L4; I, inferior articular process of L4; LB1, lateral branch of dorsal primary ramus of L1; IAB, inferior articular branches from L3 medial branch (supplies L4-L5 facet joint); IBP, intermediate branch plexus; MB3, medial branch of dorsal primary ramus of L3; NR3, third lumbar nerve root; S, superior articular process of L5; SAB,  superior articular branches from L4 (supplies L4-5 facet joint also); TP3, transverse process of L3.  Vitals:   02/16/19 1046 02/16/19 1055 02/16/19 1105 02/16/19 1113  BP: 124/82 (!) 116/96 114/73 119/72  Pulse:      Resp: 14 14 16 18   Temp:      SpO2: 93% 96% 96% 97%  Weight:      Height:         Start Time: 1030 hrs. End Time: 1046 hrs.  Imaging Guidance (Spinal) for procedure #1:  Type of Imaging Technique: Fluoroscopy Guidance (Spinal) Indication(s): Assistance in needle guidance and placement for procedures requiring needle placement in or near specific anatomical locations not easily accessible without such assistance. Exposure Time: Please see nurses notes. Contrast: None used. Fluoroscopic Guidance: I was personally present during the use of fluoroscopy. "Tunnel Vision Technique" used to obtain the best possible view of the target area. Parallax error corrected before commencing the procedure. "Direction-depth-direction" technique used to introduce the needle under continuous pulsed fluoroscopy. Once target was reached, antero-posterior, oblique, and lateral fluoroscopic projection used confirm needle placement in all planes. Images permanently stored in EMR. Interpretation: No contrast injected. I personally interpreted the imaging intraoperatively. Adequate needle placement confirmed in multiple planes. Permanent images saved into the patient's record.  Description of Procedure #2:  Area Prepped: Entire shoulder Area Prepping solution: DuraPrep (Iodine Povacrylex [0.7% available iodine] and Isopropyl Alcohol, 74% w/w) Safety Precautions: Aspiration looking for blood return was conducted prior to all injections. At no point did we inject any substances, as a needle was being advanced. No attempts were made at seeking any paresthesias. Safe injection practices and needle disposal techniques used. Medications properly checked for expiration dates. SDV (single dose vial) medications  used. Description of the Procedure: Protocol guidelines were followed. The patient was placed in position over the procedure table. The target area was identified and the area prepped in the usual manner. Skin & deeper tissues infiltrated with local anesthetic. Appropriate amount of time allowed to pass for local anesthetics to take effect. The procedure needles were then advanced to the target area. Proper needle placement secured. Negative  aspiration confirmed. Solution injected in intermittent fashion, asking for systemic symptoms every 0.5cc of injectate. The needles were then removed and the area cleansed, making sure to leave some of the prepping solution back to take advantage of its long term bactericidal properties.  Vitals:   02/16/19 1046 02/16/19 1055 02/16/19 1105 02/16/19 1113  BP: 124/82 (!) 116/96 114/73 119/72  Pulse:      Resp: 14 14 16 18   Temp:      SpO2: 93% 96% 96% 97%  Weight:      Height:        Start Time: 1030 hrs. End Time: 1046 hrs. Materials:  Needle(s) Type: Spinal Needle Gauge: 22G Length: 3.5-in Medication(s): Please see orders for medications and dosing details.  Imaging Guidance (Non-Spinal) for procedure #2:  Type of Imaging Technique: Fluoroscopy Guidance (Non-Spinal) Indication(s): Assistance in needle guidance and placement for procedures requiring needle placement in or near specific anatomical locations not easily accessible without such assistance. Exposure Time: Please see nurses notes. Contrast: Before injecting any contrast, we confirmed that the patient did not have an allergy to iodine, shellfish, or radiological contrast. Once satisfactory needle placement was completed at the desired level, radiological contrast was injected. Contrast injected under live fluoroscopy. No contrast complications. See chart for type and volume of contrast used. Fluoroscopic Guidance: I was personally present during the use of fluoroscopy. "Tunnel Vision Technique"  used to obtain the best possible view of the target area. Parallax error corrected before commencing the procedure. "Direction-depth-direction" technique used to introduce the needle under continuous pulsed fluoroscopy. Once target was reached, antero-posterior, oblique, and lateral fluoroscopic projection used confirm needle placement in all planes. Images permanently stored in EMR. Interpretation: I personally interpreted the imaging intraoperatively. Adequate needle placement confirmed in multiple planes. Appropriate spread of contrast into desired area was observed. No evidence of afferent or efferent intravascular uptake. Permanent images saved into the patient's record.  Antibiotic Prophylaxis:   Anti-infectives (From admission, onward)   None     Indication(s): None identified  Post-operative Assessment:  Post-procedure Vital Signs:  Pulse/HCG Rate: 8782 Temp: 98.8 F (37.1 C) Resp: 18 BP: 119/72 SpO2: 97 %  EBL: None  Complications: No immediate post-treatment complications observed by team, or reported by patient.  Note: The patient tolerated the entire procedure well. A repeat set of vitals were taken after the procedure and the patient was kept under observation following institutional policy, for this type of procedure. Post-procedural neurological assessment was performed, showing return to baseline, prior to discharge. The patient was provided with post-procedure discharge instructions, including a section on how to identify potential problems. Should any problems arise concerning this procedure, the patient was given instructions to immediately contact us, at any time, without hesitation. In any case, we plan to contact the patient by telephone for a follow-up status report regarding this interventional procedure.  Comments:  No additional relevant information.  Plan of Care  Orders:  Orders Placed This Encounter  Procedures   LUMBAR FACET(MEDIAL BRANCH NERVE BLOCK)  MBNB    Scheduling Instructions:     Side: Bilateral     Level: L3-4, L4-5, & L5-S1 Facets (L2, L3, L4, L5, & S1 Medial Branch Nerves)     Sedation: With Sedation.     Timeframe: Today    Order Specific Question:   Where will this procedure be performed?    Answer:   [facility]   SUPRASCAPULAR NERVE BLOCK    For shoulder pain.  Scheduling Instructions:     Purpose: Diagnostic     Laterality: Left-sided     Level(s): Suprascapular notch     Sedation: Patient's choice.     Scheduling Timeframe: Today    Order Specific Question:   Where will this procedure be performed?    Answer:   [facility]   DG PAIN CLINIC C-ARM 1-60 MIN NO REPORT    Intraoperative interpretation by procedural physician at [place].    Standing Status:   Standing    Number of Occurrences:   1    Order Specific Question:   Reason for exam:    Answer:   Assistance in needle guidance and placement for procedures requiring needle placement in or near specific anatomical locations not easily accessible without such assistance.   Provider attestation of informed consent for procedure/surgical case    I, the ordering provider, attest that I have discussed with the patient the benefits, risks, side effects, alternatives, likelihood of achieving goals and potential problems during recovery for the procedure that I have provided informed consent.    Standing Status:   Standing    Number of Occurrences:   1   Informed Consent Details: Transcribe to consent form and obtain patient signature    Standing Status:   Standing    Number of Occurrences:   1    Order Specific Question:   Procedure    Answer:   Bilateral Lumbar facet block (medial branch block) under fluoroscopic guidance. (See notes for levels.)    Order Specific Question:   Surgeon    Answer:   Quinton Voth A. [name], MD    Order Specific Question:   Indication/Reason    Answer:   Bilateral low back pain with or without lower  extremity pain   Informed Consent Details: Transcribe to consent form and obtain patient signature    Standing Status:   Standing    Number of Occurrences:   1    Order Specific Question:   Procedure    Answer:   Left suprascapular nerve block under fluoroscopic guidance.    Order Specific Question:   Surgeon    Answer:   Kathlen Brunswick. 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   Medications ordered for procedure: Meds ordered this encounter  Medications   lidocaine (XYLOCAINE) 2 % (with pres) injection 400 mg   lactated ringers infusion 1,000 mL   midazolam (VERSED) 5 MG/5ML injection 1-2 mg    Make sure Flumazenil is available in the pyxis when using this medication. If oversedation occurs, administer 0.2 mg IV over 15 sec. If after 45 sec no response, administer 0.2 mg again over 1 min; may repeat at 1 min intervals; not to exceed 4 doses (1 mg)   fentaNYL (SUBLIMAZE) injection 25-50 mcg    Make sure Narcan is available in the pyxis when using this medication. In the event of respiratory depression (RR< 8/min): Titrate NARCAN (naloxone) in increments of 0.1 to 0.2 mg IV at 2-3 minute intervals, until desired degree of reversal.   ropivacaine (PF) 2 mg/mL (0.2%) (NAROPIN) injection 18 mL   triamcinolone acetonide (KENALOG-40) injection 80 mg   ropivacaine (PF) 2 mg/mL (0.2%) (NAROPIN) injection 4 mL   methylPREDNISolone acetate (DEPO-MEDROL) injection 80 mg  Medications administered: We administered lidocaine, lactated ringers, midazolam, fentaNYL, ropivacaine (PF) 2 mg/mL (0.2%), triamcinolone acetonide, ropivacaine (PF) 2 mg/mL (0.2%), and methylPREDNISolone acetate.  See the medical record for exact dosing, route, and time of administration.  Follow-up  plan:   Return for (VV), 2 wk PP-F/U Eval.       Considering: Possible bilateral suprascapular nerveRFA Possible bilateral hip jointRFA Diagnostic caudal ESI + epidurogram  Possible Racz procedure.  Possible bilateral cervical facetRFA Diagnostic right-sided CESI  Diagnostic left sacroiliac joint block Possible bilateral sacroiliac joint RFA   Palliative PRN treatment(s): Palliative bilateral trochanteric bursa #2  Palliative bilateralsuprascapular nerve block #3 Diagnostic bilateral IA hip joint injection #3 (last done 04/30/2018)  Diagnostic bilateral lumbar facet block #4 Palliative right lumbar facet RFA #3(last done 12/16/2017)  Palliative left lumbar facet RFA #3(last done 02/12/2018)  Diagnostic bilateral cervical facet block #2 Diagnostic right sacroiliac joint block #2     Recent Visits Date Type Provider Dept  01/20/19 Office Visit Milinda Pointer, MD [facility]  Showing recent visits within past 90 days and meeting all other requirements   Today's Visits Date Type Provider Dept  02/16/19 Procedure visit Milinda Pointer, MD [facility]  Showing today's visits and meeting all other requirements   Future Appointments Date Type Provider Dept  03/04/19 Appointment Milinda Pointer, MD [facility]  04/07/19 Appointment Milinda Pointer, MD [facility]  Showing future appointments within next 90 days and meeting all other requirements   Disposition: Discharge home  Discharge Date & Time: 02/16/2019; 1116 hrs.   Primary Care Physician: Marygrace Drought, MD Location: [facility] Note by: Gaspar Cola, MD Date: 02/16/2019; Time: 11:22 AM  Disclaimer:  Medicine is not an Chief Strategy Officer. The only guarantee in medicine is that nothing is guaranteed. It is important to note that the decision to proceed with this intervention was based on the information collected from the  patient. The Data and conclusions were drawn from the patient's questionnaire, the interview, and the physical examination. Because the information was provided in large part by the patient, it cannot be guaranteed that it has not been purposely or unconsciously manipulated. Every effort has been made to obtain as much relevant data as possible for this evaluation. It is important to note that the conclusions that lead to this procedure are derived in large part from the available data. Always take into account that the treatment will also be dependent on availability of resources and existing treatment guidelines, considered by other Pain Management Practitioners as being common knowledge and practice, at the time of the intervention. For Medico-Legal purposes, it is also important to point out that variation in procedural techniques and pharmacological choices are the acceptable norm. The indications, contraindications, technique, and results of the above procedure should only be interpreted and judged by a Board-Certified Interventional Pain Specialist with extensive familiarity and expertise in the same exact procedure and technique.

## 2019-02-15 NOTE — Patient Instructions (Signed)

## 2019-02-16 ENCOUNTER — Encounter: Payer: Self-pay | Admitting: Pain Medicine

## 2019-02-16 ENCOUNTER — Other Ambulatory Visit: Payer: Self-pay

## 2019-02-16 ENCOUNTER — Ambulatory Visit (HOSPITAL_BASED_OUTPATIENT_CLINIC_OR_DEPARTMENT_OTHER): Payer: Medicaid Other | Admitting: Pain Medicine

## 2019-02-16 ENCOUNTER — Ambulatory Visit
Admission: RE | Admit: 2019-02-16 | Discharge: 2019-02-16 | Disposition: A | Discharge status: Home / Self Care | Payer: Medicaid Other | Source: Ambulatory Visit | Attending: Pain Medicine | Admitting: Pain Medicine

## 2019-02-16 VITALS — BP 119/72 | HR 87 | Temp 98.8°F | Resp 18 | Ht 65.0 in | Wt 240.0 lb

## 2019-02-16 DIAGNOSIS — M19012 Primary osteoarthritis, left shoulder: Secondary | ICD-10-CM

## 2019-02-16 DIAGNOSIS — M47817 Spondylosis without myelopathy or radiculopathy, lumbosacral region: Secondary | ICD-10-CM

## 2019-02-16 DIAGNOSIS — M19011 Primary osteoarthritis, right shoulder: Secondary | ICD-10-CM | POA: Diagnosis present

## 2019-02-16 DIAGNOSIS — M25512 Pain in left shoulder: Secondary | ICD-10-CM | POA: Insufficient documentation

## 2019-02-16 DIAGNOSIS — M25511 Pain in right shoulder: Secondary | ICD-10-CM | POA: Insufficient documentation

## 2019-02-16 DIAGNOSIS — M961 Postlaminectomy syndrome, not elsewhere classified: Secondary | ICD-10-CM

## 2019-02-16 DIAGNOSIS — G8929 Other chronic pain: Secondary | ICD-10-CM | POA: Insufficient documentation

## 2019-02-16 DIAGNOSIS — M47816 Spondylosis without myelopathy or radiculopathy, lumbar region: Secondary | ICD-10-CM

## 2019-02-16 MED ORDER — ROPIVACAINE HCL 2 MG/ML IJ SOLN
18.0000 mL | Freq: Once | INTRAMUSCULAR | Status: AC
  Administered 2019-02-16: 18 mL via PERINEURAL
  Filled 2019-02-16: qty 20

## 2019-02-16 MED ORDER — FENTANYL CITRATE (PF) 100 MCG/2ML IJ SOLN
25.0000 ug | INTRAMUSCULAR | Status: DC | PRN
  Administered 2019-02-16: 100 ug via INTRAVENOUS
  Filled 2019-02-16: qty 2

## 2019-02-16 MED ORDER — TRIAMCINOLONE ACETONIDE 40 MG/ML IJ SUSP
80.0000 mg | Freq: Once | INTRAMUSCULAR | Status: AC
  Administered 2019-02-16: 80 mg
  Filled 2019-02-16: qty 2

## 2019-02-16 MED ORDER — LACTATED RINGERS IV SOLN
1000.0000 mL | Freq: Once | INTRAVENOUS | Status: AC
  Administered 2019-02-16: 1000 mL via INTRAVENOUS

## 2019-02-16 MED ORDER — ROPIVACAINE HCL 2 MG/ML IJ SOLN
4.0000 mL | Freq: Once | INTRAMUSCULAR | Status: AC
  Administered 2019-02-16: 4 mL via PERINEURAL
  Filled 2019-02-16: qty 10

## 2019-02-16 MED ORDER — LIDOCAINE HCL 2 % IJ SOLN
20.0000 mL | Freq: Once | INTRAMUSCULAR | Status: AC
  Administered 2019-02-16: 10:00:00 400 mg
  Filled 2019-02-16: qty 40

## 2019-02-16 MED ORDER — METHYLPREDNISOLONE ACETATE 80 MG/ML IJ SUSP
80.0000 mg | Freq: Once | INTRAMUSCULAR | Status: AC
  Administered 2019-02-16: 80 mg
  Filled 2019-02-16: qty 1

## 2019-02-16 MED ORDER — MIDAZOLAM HCL 5 MG/5ML IJ SOLN
1.0000 mg | INTRAMUSCULAR | Status: DC | PRN
  Administered 2019-02-16: 5 mg via INTRAVENOUS
  Filled 2019-02-16: qty 5

## 2019-02-17 ENCOUNTER — Telehealth: Payer: Self-pay | Admitting: *Deleted

## 2019-02-17 NOTE — Telephone Encounter (Signed)
Denies any post procedure issues. 

## 2019-02-18 LAB — TOXASSURE SELECT 13 (MW), URINE

## 2019-02-23 LAB — COMP. METABOLIC PANEL (12)
AST: 20 IU/L (ref 0–40)
Albumin/Globulin Ratio: 2 (ref 1.2–2.2)
Albumin: 4.2 g/dL (ref 3.8–4.9)
Alkaline Phosphatase: 82 IU/L (ref 39–117)
BUN/Creatinine Ratio: 13 (ref 9–23)
BUN: 14 mg/dL (ref 6–24)
Bilirubin Total: 0.2 mg/dL (ref 0.0–1.2)
Calcium: 9.7 mg/dL (ref 8.7–10.2)
Chloride: 99 mmol/L (ref 96–106)
Creatinine, Ser: 1.06 mg/dL — ABNORMAL HIGH (ref 0.57–1.00)
GFR calc Af Amer: 67 mL/min/{1.73_m2} (ref >59)
GFR calc non Af Amer: 58 mL/min/{1.73_m2} — ABNORMAL LOW (ref >59)
Globulin, Total: 2.1 g/dL (ref 1.5–4.5)
Glucose: 99 mg/dL (ref 65–99)
Potassium: 4 mmol/L (ref 3.5–5.2)
Sodium: 141 mmol/L (ref 134–144)
Total Protein: 6.3 g/dL (ref 6.0–8.5)

## 2019-02-23 LAB — VITAMIN B12: Vitamin B-12: 283 pg/mL (ref 232–1245)

## 2019-02-23 LAB — C-REACTIVE PROTEIN: CRP: 2 mg/L (ref 0–10)

## 2019-02-23 LAB — 25-HYDROXY VITAMIN D LCMS D2+D3
25-Hydroxy, Vitamin D-2: 2 ng/mL
25-Hydroxy, Vitamin D-3: 25 ng/mL
25-Hydroxy, Vitamin D: 27 ng/mL — ABNORMAL LOW

## 2019-02-23 LAB — MAGNESIUM: Magnesium: 2.1 mg/dL (ref 1.6–2.3)

## 2019-02-23 LAB — SEDIMENTATION RATE: Sed Rate: 23 mm/hr (ref 0–40)

## 2019-03-03 ENCOUNTER — Telehealth: Payer: Self-pay

## 2019-03-03 NOTE — Progress Notes (Signed)
Pain Management Virtual Encounter Note - Virtual Visit via Telephone Telehealth (real-time audio visits between healthcare provider and patient).   Patient's Phone No. & Preferred Pharmacy:  [phone] (home); [phone] (mobile); (Preferred) [phone] [email or web address]  [place], [place] [address] [place] [address] Phone: [phone] Fax: [phone]    Pre-screening note:  Our staff contacted Danielle [name] and offered her an "in person", "face-to-face" appointment versus a telephone encounter. She indicated preferring the telephone encounter, at this time.   Reason for Virtual Visit: COVID-19*  Social distancing based on CDC and AMA recommendations.   I contacted Danielle [name] on 03/04/2019 via telephone.      I clearly identified myself as Gaspar Cola, MD. I verified that I was speaking with the correct person using two identifiers (Name: Danielle [name], and date of birth: July 17, 1960).  Advanced Informed Consent I sought verbal advanced consent from Danielle [name] for virtual visit interactions. I informed Danielle [name] of possible security and privacy concerns, risks, and limitations associated with providing "not-in-person" medical evaluation and management services. I also informed Danielle [name] of the availability of "in-person" appointments. Finally, I informed her that there would be a charge for the virtual visit and that she could be  personally, fully or partially, financially responsible for it. Danielle [name] expressed understanding and agreed to proceed.   Historic Elements   Danielle [name] is a 58 y.o. year old, female patient evaluated today after her last encounter by our practice on 03/03/2019. Danielle [name]  has a past medical history of Acute postoperative pain (01/13/2017), Anxiety, Arthritis, Asthma, Bell's palsy, Bursitis, COPD (chronic obstructive pulmonary disease) ([place]), Depression, Fibromyalgia,  Heart murmur, Hepatitis C, Hiatal hernia, Hyperlipidemia, Hypertension, IBS (irritable bowel syndrome), Insomnia, and Thyroid disease. She also  has a past surgical history that includes necksurgery (10/21/2014); Back surgery (2013); Foot surgery (Right); Elbow surgery (Left); Carpal tunnel release (Right); Nose surgery; Partial hysterectomy; Total shoulder replacement (Bilateral); and Colonoscopy with propofol (N/A, 02/05/2017). Danielle [name] has a current medication list which includes the following prescription(s): albuterol, cholecalciferol, citalopram, diclofenac sodium, docusate sodium, dulera, levothyroxine, loratadine, magnesium hydroxide, quad cane, naloxone, vios lc sprint deluxe, nystatin, oxycodone hcl, oxycodone hcl, spiriva handihaler, tizanidine, cetirizine, NONFORMULARY OR COMPOUNDED ITEM, and oxycodone hcl. She  reports that she has been smoking cigarettes. She has a 40.00 pack-year smoking history. She has never used smokeless tobacco. She reports that she does not drink alcohol or use drugs. Danielle [name] is allergic to aripiprazole; duloxetine; gabapentin; gold; iodinated diagnostic agents; naproxen sodium; nortriptyline hcl; nsaids; pregabalin; meperidine; ace inhibitors; aspirin; cefpodoxime; fluoxetine; fluticasone-salmeterol; levofloxacin; lithium; paroxetine; tape; telithromycin; theophylline; topiramate; trazodone; triamcinolone; venlafaxine; bupropion; ketorolac tromethamine; morphine; and moxifloxacin.   HPI  Today, she is being contacted for a post-procedure assessment.  The patient indicates having done well with the bilateral lumbar facet block where she attained 100% relief of the pain for the duration of local anesthetic and this lasted for over 5 days.  Unfortunately, some of the low back pain is beginning to come back and if feels like the right side is worse than the left.  In addition, she has been having some problems with pain in the area of the left arm, which she believes is  secondary to her ulnar neuropathy.  She did have a surgery done in that area by Dr. Rudene Christians, but since it is been getting worse.  With regards to her shoulder  block, she again indicated having attained 100% relief of the pain that also lasted for approximately 5 days, but unfortunately the shoulder pain is back to square 1.  She is also having pain on both shoulders.  Today we talked about several options regarding this and we have decided to proceed with the radiofrequency ablation of both of the shoulders since she has already had 3 diagnostic injections with good results.  For the pain that she is experiencing in the area of the left ulna, since he describes this pain as a burning sensation of this leads me to believe that were dealing with a neuropathic type of pain.  I initially offered to treat this with some gabapentin or Lyrica, but the patient has indicated that she cannot tolerate either one of those medicines.  In view of this, we will try a compounded cream to see if by any chance this can help with that pain.  Post-Procedure Evaluation  Procedure: Diagnostic bilateral lumbar facet block + left-sided suprascapular nerve block under fluoroscopic guidance and IV sedation Pre-procedure pain level:  5/10 Post-procedure: 0/10 (100% relief)  Sedation: Sedation provided.  Effectiveness during initial hour after procedure(Ultra-Short Term Relief): 100 %   Local anesthetic used: Long-acting (4-6 hours) Effectiveness: Defined as any analgesic benefit obtained secondary to the administration of local anesthetics. This carries significant diagnostic value as to the etiological location, or anatomical origin, of the pain. Duration of benefit is expected to coincide with the duration of the local anesthetic used.  Effectiveness during initial 4-6 hours after procedure(Short-Term Relief): 100 %   Long-term benefit: Defined as any relief past the pharmacologic duration of the local anesthetics.  Effectiveness  past the initial 6 hours after procedure(Long-Term Relief): 70 % (ongoing)   Current benefits: Defined as benefit that persist at this time.   Analgesia:  >70% relief Function: Danielle [name] reports improvement in function ROM: Danielle [name] reports improvement in ROM  Pharmacotherapy Assessment  Analgesic: Oxycodone IR 10 mg, 1 tab PO q 6 hrs (40 mg/dayof oxycodone) MME/day:60mg /day.   Monitoring: Pharmacotherapy: No side-effects or adverse reactions reported. [place] PMP: PDMP not reviewed this encounter.       Compliance: No problems identified. Effectiveness: Clinically acceptable. Plan: Refer to "POC".  UDS:  Summary  Date Value Ref Range Status  02/16/2019 Note  Final    Comment:    ==================================================================== [employer] Select 13 (MW) ==================================================================== Test                             Result       Flag       Units Drug Present and Declared for Prescription Verification   Oxycodone                      1884         EXPECTED   ng/mg creat   Oxymorphone                    39           EXPECTED   ng/mg creat   Noroxycodone                   1827         EXPECTED   ng/mg creat    Sources of oxycodone include scheduled prescription medications.    Oxymorphone and noroxycodone are expected metabolites of oxycodone.    Oxymorphone is  also available as a scheduled prescription medication. ==================================================================== Test                      Result    Flag   Units      Ref Range   Creatinine              283              mg/dL      >=20 ==================================================================== Declared Medications:  The flagging and interpretation on this report are based on the  following declared medications.  Unexpected results may arise from  inaccuracies in the declared medications.  **Note: The testing scope of this panel includes these  medications:  Oxycodone  **Note: The testing scope of this panel does not include the  following reported medications:  Albuterol (Ventolin HFA)  Cetirizine (Zyrtec)  Citalopram (Celexa)  Diclofenac (Voltaren)  Docusate (Colace)  Formoterol (Dulera)  Levothyroxine (Synthroid)  Loratadine (Claritin)  Magnesium (Milk of Magnesia)  Mometasone (Dulera)  Naloxone (Narcan)  Nystatin  Tiotropium (Spiriva)  Tizanidine (Zanaflex)  Vitamin D ==================================================================== For clinical consultation, please call [phone]. ====================================================================    Laboratory Chemistry Profile (12 mo)  Renal: 02/16/2019: BUN 14; BUN/Creatinine Ratio 13; Creatinine, Ser 1.06  Lab Results  Component Value Date   GFRAA 67 02/16/2019   GFRNONAA 58 (L) 02/16/2019   Hepatic: 02/16/2019: Albumin 4.2 Lab Results  Component Value Date   AST 20 02/16/2019   ALT 14 01/08/2016   Other: 02/16/2019: 25-Hydroxy, Vitamin D 27; 25-Hydroxy, Vitamin D-2 2.0; 25-Hydroxy, Vitamin D-3 25; CRP 2; Sed Rate 23; Vitamin B-12 283 Note: Above Lab results reviewed.  Imaging  Last 90 days:  Dg Pain Clinic C-arm 1-60 Min No Report  Result Date: 02/16/2019 Fluoro was used, but no Radiologist interpretation will be provided. Please refer to "NOTES" tab for provider progress note.   Assessment  The primary encounter diagnosis was Chronic pain syndrome. Diagnoses of Chronic shoulder pain (Primary Area of Pain) (Bilateral) (R>L), Chronic hip pain (Secondary area of Pain) (Bilateral) (R>L), Chronic low back pain (Third area of Pain) (Bilateral) (R>L), and Ulnar neuropathy (Left) were also pertinent to this visit.  Plan of Care  I am having Danielle [name] "ANGIE" start on NONFORMULARY OR COMPOUNDED ITEM. I am also having her maintain her magnesium hydroxide, nystatin, levothyroxine, Spiriva HandiHaler, naloxone, cetirizine, albuterol, docusate  sodium, citalopram, cholecalciferol, Vios LC Sprint Deluxe, [employer], diclofenac sodium, loratadine, Dulera, tiZANidine, Oxycodone HCl, Oxycodone HCl, and Oxycodone HCl.  Pharmacotherapy (Medications Ordered): Meds ordered this encounter  Medications  . NONFORMULARY OR COMPOUNDED ITEM    Sig: Sig: Apply 1-2 gm(s) (2-4 pumps) to affected area, 3-4 times/day. (1 pump = 0.5 gm)    Dispense:  1 each    Refill:  2    Compounded cream: 2.5% Lidocaine, 10% Ketamine, 10% Ketoprofen, 6% Gabapentin  Dispense: 120 gm Pump Bottle. (Dispenser: 1 pump = 0.5 gm.)   Orders:  Orders Placed This Encounter  Procedures  . Radiofrequency Shoulder Joint    For shoulder pain.    Standing Status:   Future    Standing Expiration Date:   09/03/2020    Scheduling Instructions:     Laterality: Bilateral     Level(s): Suprascapular nerve     Sedation: Patient's choice     Scheduling Timeframe: As soon as pre-approved    Order Specific Question:   Where will this procedure be performed?    Answer:  [facility]   Follow-up plan:   Return for RFA: (B) Suprascapular Nerve RFA , (w/ Sedation), (ASAP).      Considering: Possible bilateral suprascapular nerveRFA Possible bilateral hip jointRFA Diagnostic caudal ESI + epidurogram  Possible Racz procedure.  Possible bilateral cervical facetRFA Diagnostic right-sided CESI  Diagnostic left sacroiliac joint block Possible bilateral sacroiliac joint RFA   Palliative PRN treatment(s): Palliative bilateral trochanteric bursa #2  Palliative bilateralsuprascapular nerve block #3 Diagnostic bilateral IA hip joint injection #3 (last done 04/30/2018)  Diagnostic bilateral lumbar facet block #4 Palliative right lumbar facet RFA #3(last done 12/16/2017)  Palliative left lumbar facet RFA #3(last done 02/12/2018)  Diagnostic bilateral cervical facet block #2 Diagnostic right sacroiliac joint block #2      Recent Visits Date Type Provider  Dept  02/16/19 Procedure visit Milinda Pointer, MD [facility]  01/20/19 Office Visit Milinda Pointer, MD [facility]  Showing recent visits within past 90 days and meeting all other requirements   Today's Visits Date Type Provider Dept  03/04/19 Office Visit Milinda Pointer, MD [facility]  Showing today's visits and meeting all other requirements   Future Appointments Date Type Provider Dept  04/07/19 Appointment Milinda Pointer, MD [facility]  Showing future appointments within next 90 days and meeting all other requirements   I discussed the assessment and treatment plan with the patient. The patient was provided an opportunity to ask questions and all were answered. The patient agreed with the plan and demonstrated an understanding of the instructions.  Patient advised to call back or seek an in-person evaluation if the symptoms or condition worsens.  Total duration of non-face-to-face encounter: 20 minutes.  Note by: Gaspar Cola, MD Date: 03/04/2019; Time: 5:04 PM  Note: This dictation was prepared with Dragon dictation. Any transcriptional errors that may result from this process are unintentional.  Disclaimer:  * Given the special circumstances of the COVID-19 pandemic, the federal government has announced that the Office for Civil Rights (OCR) will exercise its enforcement discretion and will not impose penalties on physicians using telehealth in the event of noncompliance with regulatory requirements under the [place] (HIPAA) in connection with the good faith provision of telehealth during the DHRCB-63 national public health emergency. ([place])

## 2019-03-03 NOTE — Telephone Encounter (Signed)
Pt was called and message was left on answering service to call back for medications update.

## 2019-03-04 ENCOUNTER — Ambulatory Visit: Payer: Medicaid Other | Attending: Pain Medicine | Admitting: Pain Medicine

## 2019-03-04 ENCOUNTER — Other Ambulatory Visit: Payer: Self-pay

## 2019-03-04 ENCOUNTER — Encounter: Payer: Self-pay | Admitting: Pain Medicine

## 2019-03-04 DIAGNOSIS — G8929 Other chronic pain: Secondary | ICD-10-CM

## 2019-03-04 DIAGNOSIS — M25511 Pain in right shoulder: Secondary | ICD-10-CM

## 2019-03-04 DIAGNOSIS — M25551 Pain in right hip: Secondary | ICD-10-CM | POA: Diagnosis not present

## 2019-03-04 DIAGNOSIS — M25552 Pain in left hip: Secondary | ICD-10-CM

## 2019-03-04 DIAGNOSIS — G894 Chronic pain syndrome: Secondary | ICD-10-CM

## 2019-03-04 DIAGNOSIS — M5441 Lumbago with sciatica, right side: Secondary | ICD-10-CM

## 2019-03-04 DIAGNOSIS — G5622 Lesion of ulnar nerve, left upper limb: Secondary | ICD-10-CM

## 2019-03-04 DIAGNOSIS — M25512 Pain in left shoulder: Secondary | ICD-10-CM

## 2019-03-04 MED ORDER — NONFORMULARY OR COMPOUNDED ITEM: 2 refills | Status: DC

## 2019-03-04 NOTE — Patient Instructions (Signed)

## 2019-04-06 ENCOUNTER — Ambulatory Visit: Payer: Medicaid Other | Admitting: Pain Medicine

## 2019-04-06 ENCOUNTER — Encounter: Payer: Self-pay | Admitting: Pain Medicine

## 2019-04-07 ENCOUNTER — Ambulatory Visit: Payer: Medicaid Other | Attending: Pain Medicine | Admitting: Pain Medicine

## 2019-04-07 ENCOUNTER — Other Ambulatory Visit: Payer: Self-pay

## 2019-04-07 ENCOUNTER — Telehealth: Payer: Self-pay | Admitting: *Deleted

## 2019-04-07 DIAGNOSIS — M5441 Lumbago with sciatica, right side: Secondary | ICD-10-CM

## 2019-04-07 DIAGNOSIS — G894 Chronic pain syndrome: Secondary | ICD-10-CM

## 2019-04-07 DIAGNOSIS — M25551 Pain in right hip: Secondary | ICD-10-CM

## 2019-04-07 DIAGNOSIS — M25512 Pain in left shoulder: Secondary | ICD-10-CM

## 2019-04-07 DIAGNOSIS — M25511 Pain in right shoulder: Secondary | ICD-10-CM | POA: Diagnosis not present

## 2019-04-07 DIAGNOSIS — G8929 Other chronic pain: Secondary | ICD-10-CM

## 2019-04-07 DIAGNOSIS — M159 Polyosteoarthritis, unspecified: Secondary | ICD-10-CM

## 2019-04-07 DIAGNOSIS — M7918 Myalgia, other site: Secondary | ICD-10-CM

## 2019-04-07 DIAGNOSIS — M150 Primary generalized (osteo)arthritis: Secondary | ICD-10-CM

## 2019-04-07 DIAGNOSIS — M62830 Muscle spasm of back: Secondary | ICD-10-CM

## 2019-04-07 DIAGNOSIS — M25552 Pain in left hip: Secondary | ICD-10-CM

## 2019-04-07 DIAGNOSIS — G5622 Lesion of ulnar nerve, left upper limb: Secondary | ICD-10-CM

## 2019-04-07 MED ORDER — NONFORMULARY OR COMPOUNDED ITEM: 2 refills | Status: DC

## 2019-04-07 MED ORDER — TIZANIDINE HCL 4 MG PO TABS: 4.0000 mg | ORAL_TABLET | Freq: Three times a day (TID) | ORAL | 2 refills | Status: DC | PRN

## 2019-04-07 MED ORDER — OXYCODONE HCL 10 MG PO TABS: 10.0000 mg | ORAL_TABLET | Freq: Four times a day (QID) | ORAL | 0 refills | Status: DC | PRN

## 2019-04-07 NOTE — Telephone Encounter (Signed)
Called patient and left voicemail to get her ok to send her compounding cream to [place]. Script is in the script book and fax number to [facility] is on bottom left-hand corner of the script.

## 2019-04-07 NOTE — Progress Notes (Signed)
Pain Management Virtual Encounter Note - Virtual Visit via Telephone Telehealth (real-time audio visits between healthcare provider and patient).   Patient's Phone No. & Preferred Pharmacy:  [phone] (home); [phone] (mobile); (Preferred) [phone] [email or web address]  Danielle [name], Danielle [name] Phone: [phone] Fax: [phone]    Pre-screening note:  Our staff contacted Danielle [name] and offered her an "in person", "face-to-face" appointment versus a telephone encounter. She indicated preferring the telephone encounter, at this time.   Reason for Virtual Visit: COVID-19*  Social distancing based on CDC and AMA recommendations.   I contacted Danielle [name] on 04/07/2019 via telephone.      I clearly identified myself as Gaspar Cola, MD. I verified that I was speaking with the correct person using two identifiers (Name: Danielle [name], and date of birth: Sep 21, 1960).  Advanced Informed Consent I sought verbal advanced consent from Danielle [name] for virtual visit interactions. I informed Danielle [name] of possible security and privacy concerns, risks, and limitations associated with providing "not-in-person" medical evaluation and management services. I also informed Danielle [name] of the availability of "in-person" appointments. Finally, I informed her that there would be a charge for the virtual visit and that she could be  personally, fully or partially, financially responsible for it. Danielle [name] expressed understanding and agreed to proceed.   Historic Elements   Danielle [name] is a 58 y.o. year old, female patient evaluated today after her last encounter by our practice on 03/04/2019. Danielle [name]  has a past medical history of Acute postoperative pain (01/13/2017), Anxiety, Arthritis, Asthma, Bell's palsy, Bursitis, COPD (chronic obstructive pulmonary disease) ([place]), Depression, Fibromyalgia,  Heart murmur, Hepatitis C, Hiatal hernia, Hyperlipidemia, Hypertension, IBS (irritable bowel syndrome), Insomnia, and Thyroid disease. She also  has a past surgical history that includes necksurgery (10/21/2014); Back surgery (2013); Foot surgery (Right); Elbow surgery (Left); Carpal tunnel release (Right); Nose surgery; Partial hysterectomy; Total shoulder replacement (Bilateral); and Colonoscopy with propofol (N/A, 02/05/2017). Danielle [name] has a current medication list which includes the following prescription(s): albuterol, cholecalciferol, citalopram, diclofenac sodium, docusate sodium, dulera, levothyroxine, loratadine, magnesium hydroxide, quad cane, naloxone, vios lc sprint deluxe, nystatin, oxycodone hcl, oxycodone hcl, oxycodone hcl, spiriva handihaler, tizanidine, cetirizine, and NONFORMULARY OR COMPOUNDED ITEM. She  reports that she has been smoking cigarettes. She has a 40.00 pack-year smoking history. She has never used smokeless tobacco. She reports that she does not drink alcohol or use drugs. Danielle [name] is allergic to aripiprazole; duloxetine; gabapentin; gold; iodinated diagnostic agents; naproxen sodium; nortriptyline hcl; nsaids; pregabalin; meperidine; ace inhibitors; aspirin; cefpodoxime; fluoxetine; fluticasone-salmeterol; levofloxacin; lithium; paroxetine; tape; telithromycin; theophylline; topiramate; trazodone; triamcinolone; venlafaxine; bupropion; ketorolac tromethamine; morphine; and moxifloxacin.   HPI  Today, she is being contacted for medication management.  The patient indicates doing well with the current medication regimen. No adverse reactions or side effects reported to the medications.  Unfortunately, the patient indicates that nobody contacted her with regards to the compounded cream that I ordered the last time.  I went ahead and wrote another prescription for it and I had a printed.  I have sent a message to Anderson Malta to follow-up with it so that she can get the patient set  up with a compounding pharmacy to mail the cream to her.  Although the patient was doing relatively well regarding her medications and pain today, she appears to have a severe acute sinusitis with runny nose,  and swelling of her eyes.  She indicated that she had attempted to contact her primary care physician today and they told her to call back later on today.  We will follow-up with her in approximately 3 months to see how she is doing with the new compounded cream.  Pharmacotherapy Assessment  Analgesic: Oxycodone IR 10 mg, 1 tab PO q 6 hrs (40 mg/dayof oxycodone) MME/day:60mg /day.   Monitoring: Pharmacotherapy: No side-effects or adverse reactions reported. [place] PMP: PDMP reviewed during this encounter.       Compliance: No problems identified. Effectiveness: Clinically acceptable. Plan: Refer to "POC".  UDS:  Summary  Date Value Ref Range Status  02/16/2019 Note  Final    Comment:    ==================================================================== [facility] Select 13 (MW) ==================================================================== Test                             Result       Flag       Units Drug Present and Declared for Prescription Verification   Oxycodone                      1884         EXPECTED   ng/mg creat   Oxymorphone                    39           EXPECTED   ng/mg creat   Noroxycodone                   1827         EXPECTED   ng/mg creat    Sources of oxycodone include scheduled prescription medications.    Oxymorphone and noroxycodone are expected metabolites of oxycodone.    Oxymorphone is also available as a scheduled prescription medication. ==================================================================== Test                      Result    Flag   Units      Ref Range   Creatinine              283              mg/dL      >=20 ==================================================================== Declared Medications:  The flagging and  interpretation on this report are based on the  following declared medications.  Unexpected results may arise from  inaccuracies in the declared medications.  **Note: The testing scope of this panel includes these medications:  Oxycodone  **Note: The testing scope of this panel does not include the  following reported medications:  Albuterol (Ventolin HFA)  Cetirizine (Zyrtec)  Citalopram (Celexa)  Diclofenac (Voltaren)  Docusate (Colace)  Formoterol (Dulera)  Levothyroxine (Synthroid)  Loratadine (Claritin)  Magnesium (Milk of Magnesia)  Mometasone (Dulera)  Naloxone (Narcan)  Nystatin  Tiotropium (Spiriva)  Tizanidine (Zanaflex)  Vitamin D ==================================================================== For clinical consultation, please call [phone]. ====================================================================    Laboratory Chemistry Profile (12 mo)  Renal: 02/16/2019: BUN 14; BUN/Creatinine Ratio 13; Creatinine, Ser 1.06  Lab Results  Component Value Date   GFRAA 67 02/16/2019   GFRNONAA 58 (L) 02/16/2019   Hepatic: 02/16/2019: Albumin 4.2 Lab Results  Component Value Date   AST 20 02/16/2019   ALT 14 01/08/2016   Other: 02/16/2019: 25-Hydroxy, Vitamin D 27; 25-Hydroxy, Vitamin D-2 2.0; 25-Hydroxy, Vitamin D-3 25; CRP 2; Sed Rate 23; Vitamin B-12 283  Note: Above Lab results reviewed.  Imaging  Last 90 days:  Dg [facility] Clinic C-arm 1-60 Min No Report  Result Date: 02/16/2019 Fluoro was used, but no Radiologist interpretation will be provided. Please refer to "NOTES" tab for provider progress note.   Assessment  The primary encounter diagnosis was Chronic pain syndrome. Diagnoses of Chronic shoulder pain (Primary Area of Pain) (Bilateral) (R>L), Chronic hip pain (Secondary area of Pain) (Bilateral) (R>L), Chronic low back pain (Third area of Pain) (Bilateral) (R>L), Muscle spasm of back, Chronic musculoskeletal pain, Osteoarthritis involving multiple  joints, and Ulnar neuropathy (Left) were also pertinent to this visit.  Plan of Care  I have discontinued Danielle [name] NONFORMULARY OR COMPOUNDED ITEM. I am also having her start on Oxycodone HCl, Oxycodone HCl, and NONFORMULARY OR COMPOUNDED ITEM. Additionally, I am having her maintain her magnesium hydroxide, nystatin, levothyroxine, Spiriva HandiHaler, naloxone, cetirizine, albuterol, docusate sodium, citalopram, cholecalciferol, Vios LC Sprint Deluxe, [employer], diclofenac sodium, loratadine, Dulera, tiZANidine, and Oxycodone HCl.  Pharmacotherapy (Medications Ordered): Meds ordered this encounter  Medications  . tiZANidine (ZANAFLEX) 4 MG tablet    Sig: Take 1 tablet (4 mg total) by mouth every 8 (eight) hours as needed for muscle spasms. Max: 3/day    Dispense:  90 tablet    Refill:  2    Fill one day early if pharmacy is closed on scheduled refill date. May substitute for generic if available.  . Oxycodone HCl 10 MG TABS    Sig: Take 1 tablet (10 mg total) by mouth every 6 (six) hours as needed. Must last 30 days    Dispense:  120 tablet    Refill:  0    Chronic Pain: STOP Act (Not applicable) Fill 1 day early if closed on refill date. Do not fill until: 04/20/2019. To last until: 05/20/2019. Avoid benzodiazepines within 8 hours of opioids  . Oxycodone HCl 10 MG TABS    Sig: Take 1 tablet (10 mg total) by mouth every 6 (six) hours as needed. Must last 30 days    Dispense:  120 tablet    Refill:  0    Chronic Pain: STOP Act (Not applicable) Fill 1 day early if closed on refill date. Do not fill until: 05/20/2019. To last until: 06/19/2019. Avoid benzodiazepines within 8 hours of opioids  . Oxycodone HCl 10 MG TABS    Sig: Take 1 tablet (10 mg total) by mouth every 6 (six) hours as needed. Must last 30 days    Dispense:  120 tablet    Refill:  0    Chronic Pain: STOP Act (Not applicable) Fill 1 day early if closed on refill date. Do not fill until: 06/19/2019. To last until:  07/19/2019. Avoid benzodiazepines within 8 hours of opioids  . NONFORMULARY OR COMPOUNDED ITEM    Sig: Sig: Apply 1-2 gm(s) (2-4 pumps) to affected area, 3-4 times/day. (1 pump = 0.5 gm)    Dispense:  1 each    Refill:  2    Compounded cream: 2.5% Lidocaine, 10% Ketamine, 10% Ketoprofen, 6% Gabapentin  Dispense: 120 gm Pump Bottle. (Dispenser: 1 pump = 0.5 gm.)   Orders:  No orders of the defined types were placed in this encounter.  Follow-up plan:   Return for (VV), (MM) to follow-up on the compounded cream trial.      Considering: Possible bilateral suprascapular nerveRFA Possible bilateral hip jointRFA Diagnostic caudal ESI + epidurogram  Possible Racz procedure.  Possible bilateral cervical facetRFA Diagnostic  right-sided CESI  Diagnostic left sacroiliac joint block Possible bilateral sacroiliac joint RFA   Palliative PRN treatment(s): Palliative bilateral trochanteric bursa #2  Palliative bilateralsuprascapular nerve block #3 Diagnostic bilateral IA hip joint injection #3 (last done 04/30/2018)  Diagnostic bilateral lumbar facet block #4 Palliative right lumbar facet RFA #3(last done 12/16/2017)  Palliative left lumbar facet RFA #3(last done 02/12/2018)  Diagnostic bilateral cervical facet block #2 Diagnostic right sacroiliac joint block #2    Recent Visits Date Type Provider Dept  03/04/19 Office Visit Milinda Pointer, MD [facility]  02/16/19 Procedure visit Milinda Pointer, MD [facility]  01/20/19 Office Visit Milinda Pointer, MD [facility]  Showing recent visits within past 90 days and meeting all other requirements   Today's Visits Date Type Provider Dept  04/07/19 Office Visit Milinda Pointer, MD [facility]  Showing today's visits and meeting all other requirements   Future Appointments Date Type Provider Dept  04/20/19 Appointment Milinda Pointer, MD [facility]  Showing  future appointments within next 90 days and meeting all other requirements   I discussed the assessment and treatment plan with the patient. The patient was provided an opportunity to ask questions and all were answered. The patient agreed with the plan and demonstrated an understanding of the instructions.  Patient advised to call back or seek an in-person evaluation if the symptoms or condition worsens.  Total duration of non-face-to-face encounter: 15 minutes.  Note by: Gaspar Cola, MD Date: 04/07/2019; Time: 11:37 AM  Note: This dictation was prepared with Dragon dictation. Any transcriptional errors that may result from this process are unintentional.  Disclaimer:  * Given the special circumstances of the COVID-19 pandemic, the federal government has announced that the Office for Civil Rights (OCR) will exercise its enforcement discretion and will not impose penalties on physicians using telehealth in the event of noncompliance with regulatory requirements under the [place] (HIPAA) in connection with the good faith provision of telehealth during the XX123456 national public health emergency. ([place])

## 2019-04-20 ENCOUNTER — Ambulatory Visit: Payer: Medicaid Other | Admitting: Pain Medicine

## 2019-04-28 ENCOUNTER — Other Ambulatory Visit: Payer: Self-pay

## 2019-04-28 ENCOUNTER — Emergency Department
Admission: EM | Admit: 2019-04-28 | Discharge: 2019-04-28 | Disposition: A | Discharge status: Home / Self Care | Payer: Medicaid Other | Attending: Emergency Medicine | Admitting: Emergency Medicine

## 2019-04-28 ENCOUNTER — Emergency Department: Payer: Medicaid Other

## 2019-04-28 ENCOUNTER — Encounter: Payer: Self-pay | Admitting: Emergency Medicine

## 2019-04-28 DIAGNOSIS — Z79899 Other long term (current) drug therapy: Secondary | ICD-10-CM | POA: Insufficient documentation

## 2019-04-28 DIAGNOSIS — R05 Cough: Secondary | ICD-10-CM | POA: Diagnosis present

## 2019-04-28 DIAGNOSIS — R059 Cough, unspecified: Secondary | ICD-10-CM

## 2019-04-28 DIAGNOSIS — Z20828 Contact with and (suspected) exposure to other viral communicable diseases: Secondary | ICD-10-CM | POA: Diagnosis not present

## 2019-04-28 DIAGNOSIS — J441 Chronic obstructive pulmonary disease with (acute) exacerbation: Secondary | ICD-10-CM | POA: Diagnosis not present

## 2019-04-28 DIAGNOSIS — I10 Essential (primary) hypertension: Secondary | ICD-10-CM | POA: Insufficient documentation

## 2019-04-28 DIAGNOSIS — F17210 Nicotine dependence, cigarettes, uncomplicated: Secondary | ICD-10-CM | POA: Diagnosis not present

## 2019-04-28 DIAGNOSIS — J40 Bronchitis, not specified as acute or chronic: Secondary | ICD-10-CM | POA: Insufficient documentation

## 2019-04-28 LAB — CBC WITH DIFFERENTIAL/PLATELET
Abs Immature Granulocytes: 0.08 10*3/uL — ABNORMAL HIGH (ref 0.00–0.07)
Basophils Absolute: 0.1 10*3/uL (ref 0.0–0.1)
Basophils Relative: 1 %
Eosinophils Absolute: 0.5 10*3/uL (ref 0.0–0.5)
Eosinophils Relative: 4 %
HCT: 41.2 % (ref 36.0–46.0)
Hemoglobin: 13.7 g/dL (ref 12.0–15.0)
Immature Granulocytes: 1 %
Lymphocytes Relative: 27 %
Lymphs Abs: 3.6 10*3/uL (ref 0.7–4.0)
MCH: 31 pg (ref 26.0–34.0)
MCHC: 33.3 g/dL (ref 30.0–36.0)
MCV: 93.2 fL (ref 80.0–100.0)
Monocytes Absolute: 0.9 10*3/uL (ref 0.1–1.0)
Monocytes Relative: 7 %
Neutro Abs: 8.4 10*3/uL — ABNORMAL HIGH (ref 1.7–7.7)
Neutrophils Relative %: 60 %
Platelets: 318 10*3/uL (ref 150–400)
RBC: 4.42 MIL/uL (ref 3.87–5.11)
RDW: 13.3 % (ref 11.5–15.5)
WBC: 13.5 10*3/uL — ABNORMAL HIGH (ref 4.0–10.5)
nRBC: 0 % (ref 0.0–0.2)

## 2019-04-28 LAB — COMPREHENSIVE METABOLIC PANEL
ALT: 16 U/L (ref 0–44)
AST: 18 U/L (ref 15–41)
Albumin: 3.7 g/dL (ref 3.5–5.0)
Alkaline Phosphatase: 80 U/L (ref 38–126)
Anion gap: 9 (ref 5–15)
BUN: 14 mg/dL (ref 6–20)
CO2: 25 mmol/L (ref 22–32)
Calcium: 9.3 mg/dL (ref 8.9–10.3)
Chloride: 105 mmol/L (ref 98–111)
Creatinine, Ser: 0.98 mg/dL (ref 0.44–1.00)
GFR calc Af Amer: >60 mL/min (ref >60)
GFR calc non Af Amer: >60 mL/min (ref >60)
Glucose, Bld: 106 mg/dL — ABNORMAL HIGH (ref 70–99)
Potassium: 4.1 mmol/L (ref 3.5–5.1)
Sodium: 139 mmol/L (ref 135–145)
Total Bilirubin: 0.6 mg/dL (ref 0.3–1.2)
Total Protein: 7 g/dL (ref 6.5–8.1)

## 2019-04-28 MED ORDER — GUAIFENESIN-CODEINE 100-10 MG/5ML PO SOLN: 5.0000 mL | Freq: Three times a day (TID) | ORAL | 0 refills | Status: DC | PRN

## 2019-04-28 MED ORDER — PREDNISONE 10 MG (21) PO TBPK: ORAL_TABLET | ORAL | 0 refills | Status: DC

## 2019-04-28 MED ORDER — PREDNISONE 20 MG PO TABS
60.0000 mg | ORAL_TABLET | Freq: Once | ORAL | Status: AC
  Administered 2019-04-28: 60 mg via ORAL
  Filled 2019-04-28: qty 3

## 2019-04-28 MED ORDER — GUAIFENESIN-CODEINE 100-10 MG/5ML PO SOLN
5.0000 mL | Freq: Once | ORAL | Status: AC
  Administered 2019-04-28: 5 mL via ORAL
  Filled 2019-04-28: qty 5

## 2019-04-28 NOTE — ED Triage Notes (Signed)
Patient to ER for c/o shortness of breath with coughing with general malaise. Denies any known fevers. Patient reports coughing up clear phlegm with green tint.

## 2019-04-28 NOTE — ED Notes (Signed)
Updated on wait time. Updated on lab and CXR results. Awaiting room for MD eval.

## 2019-04-28 NOTE — Discharge Instructions (Addendum)
Please seek medical attention for any high fevers, chest pain, shortness of breath, change in behavior, persistent vomiting, bloody stool or any other new or concerning symptoms.  

## 2019-04-28 NOTE — ED Provider Notes (Signed)
[facility] Emergency Department Provider Note   ____________________________________________   I have reviewed the triage vital signs and the nursing notes.   HISTORY  Chief Complaint Cough and Shortness of Breath   History limited by: Not Limited   HPI Danielle [name] is a 58 y.o. female who presents to the emergency department today with primary concern for cough and shortness of breath. The symptoms have been present for roughly 1.5 weeks. She has history of COPD. She discussed with her doctor who prescribed an antibiotics and steroid. The patient states she finished these medications without any significant improvement of symptoms. Her main concern is for cough which is sever and is causing the patient to lose sleep. No recent fever.    Records reviewed. Per medical record review patient has a history of COPD.   Past Medical History:  Diagnosis Date  . Acute postoperative pain 01/13/2017  . Anxiety   . Arthritis   . Asthma   . Bell's palsy   . Bursitis   . COPD (chronic obstructive pulmonary disease) ([place])   . Depression   . Fibromyalgia   . Heart murmur   . Hepatitis C   . Hiatal hernia   . Hyperlipidemia   . Hypertension   . IBS (irritable bowel syndrome)   . Insomnia   . Thyroid disease     Patient Active Problem List   Diagnosis Date Noted  . Osteoarthritis involving multiple joints 01/20/2019  . Degenerative joint disease of both hips 06/05/2018  . Pharmacologic therapy 06/03/2018  . Disorder of skeletal system 06/03/2018  . Problems influencing health status 06/03/2018  . Chronic trochanteric bursitis (Bilateral) 06/03/2018  . Elevated sed rate 06/03/2018  . Elevated vitamin B12 level 06/03/2018  . History of allergy to iodine & radiographic dye 04/30/2018  . URI (upper respiratory infection) 04/08/2018  . Cellulitis of leg, right 03/25/2018  . Abnormal mammogram of left breast 10/06/2017  . Spondylosis without myelopathy or  radiculopathy, lumbosacral region 09/24/2017  . Osteoarthritis of shoulder (Bilateral) 08/19/2017  . Morbid (severe) obesity due to excess calories ([place]) 07/24/2017  . Chronic hip pain (Secondary area of Pain) (Bilateral) (R>L) 06/11/2017  . 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) 06/11/2017  . Trigger point with back pain 06/11/2017  . Myofascial pain 05/27/2017  . Nausea 01/13/2017  . Vasovagal episode 01/13/2017  . Encounter for screening colonoscopy 09/12/2016  . Rectal bleeding 09/12/2016  . Cervical facet syndrome ([place]) 09/05/2016  . Chronic musculoskeletal pain 09/05/2016  . Muscle spasm of back 09/05/2016  . Chronic pain syndrome 07/25/2016  . Complaints of weakness of lower extremity 07/25/2016  . Morbid obesity with BMI of 40.0-44.9, adult ([place]) 07/25/2016  . Anterolisthesis 07/25/2016  . Seasonal allergic rhinitis 03/01/2016  . 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 Chronic sacroiliac pain (Bilateral) (R>L) 01/11/2016  . Long term prescription opiate use 01/02/2016  . Opiate use (60 MME/Day) 01/02/2016  . Chronic shoulder pain (Primary Area of Pain) (Bilateral) (R>L) 01/02/2016  . History of total shoulder replacement (Bilateral) 01/02/2016  . Chronic low back pain (Third area of Pain) (Bilateral) (R>L) 01/02/2016  . Lumbar facet syndrome (Bilateral) (R>L) 01/02/2016  . Lumbar spondylosis 01/02/2016  . 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 Chronic lower extremity pain (Right) 01/02/2016  . Chronic neck pain (Bilateral) (R>L) 01/02/2016  . Chronic cervical radicular pain (Right) 01/02/2016  . Ulnar neuropathy (Left) 01/02/2016  . Hx of cervical spine surgery 01/02/2016  . Cervical spondylosis  01/02/2016  . Encounter for therapeutic drug level monitoring 01/02/2016  . Encounter for pain management planning 01/02/2016  . Fibromyalgia 01/02/2016  . Groin pain 10/09/2015  . Mucositis oral 09/12/2015  . Chronic obstructive pulmonary disease ([place])  01/02/2015  . Current tobacco use 01/02/2015  . Mixed simple and mucopurulent chronic bronchitis ([place]) 01/02/2015  . Cervical spinal cord compression [facility]) (April, 2017) 10/21/2014  . Cervical spinal stenosis 05/10/2014  . Adiposity 03/22/2014  . B12 deficiency 02/10/2014  . Hypertension 12/11/2013  . Clinical depression 12/10/2013  . Adult hypothyroidism 12/10/2013    Past Surgical History:  Procedure Laterality Date  . BACK SURGERY  2013   lumbar disk  . CARPAL TUNNEL RELEASE Right   . COLONOSCOPY WITH PROPOFOL N/A 02/05/2017   Procedure: COLONOSCOPY WITH PROPOFOL;  Surgeon: Robert Bellow, MD;  Location: [facility] ENDOSCOPY;  Service: Endoscopy;  Laterality: N/A;  . ELBOW SURGERY Left   . FOOT SURGERY Right   . necksurgery  10/21/2014   spine neck fusion  . NOSE SURGERY    . PARTIAL HYSTERECTOMY    . TOTAL SHOULDER REPLACEMENT Bilateral     Prior to Admission medications   Medication Sig Start Date End Date Taking? Authorizing Provider  albuterol (PROVENTIL HFA;VENTOLIN HFA) 108 (90 Base) MCG/ACT inhaler Inhale 2 puffs into the lungs every 4 (four) hours as needed for wheezing or shortness of breath.  11/03/15   [provider]  cetirizine (ZYRTEC) 10 MG tablet Take 10 mg by mouth daily.  03/01/16 01/19/19  [provider]  cholecalciferol (VITAMIN D) 1000 units tablet Take 1,000 Units by mouth daily.    [provider]  citalopram (CELEXA) 20 MG tablet Take 20 mg by mouth daily. 01/23/18   [provider]  diclofenac sodium (VOLTAREN) 1 % GEL Apply 2 g topically 4 (four) times daily. 07/23/18   Vevelyn Francois, NP  docusate sodium (COLACE) 100 MG capsule Take 100 mg by mouth 2 (two) times daily.    [provider]  DULERA 200-5 MCG/ACT AERO Inhale 2 puffs into the lungs 2 (two) times daily. 09/21/18   [provider]  levothyroxine (SYNTHROID, LEVOTHROID) 50 MCG tablet TAKE 1 TABLET (50 MCG TOTAL) BY MOUTH DAILY AT 0600. 12/15/15    [provider]  loratadine (CLARITIN) 10 MG tablet Take 10 mg by mouth daily.    [provider]  magnesium hydroxide (MILK OF MAGNESIA) 400 MG/5ML suspension Take by mouth.    [provider]  Misc. Devices (QUAD CANE) MISC Use as directed 05/14/18   [provider]  naloxone [facility]) 2 MG/2ML injection Inject content of syringe into thigh muscle. Call 911. 01/11/16   Milinda Pointer, MD  Nebulizers (VIOS LC SPRINT DELUXE) MISC by Does not apply route. 05/14/18 05/15/19  [provider]  NONFORMULARY OR COMPOUNDED ITEM Sig: Apply 1-2 gm(s) (2-4 pumps) to affected area, 3-4 times/day. (1 pump = 0.5 gm) 04/07/19 07/06/19  Milinda Pointer, MD  nystatin (MYCOSTATIN/NYSTOP) powder Apply topically.    [provider]  Oxycodone HCl 10 MG TABS Take 1 tablet (10 mg total) by mouth every 6 (six) hours as needed. Must last 30 days 04/20/19 05/20/19  Milinda Pointer, MD  Oxycodone HCl 10 MG TABS Take 1 tablet (10 mg total) by mouth every 6 (six) hours as needed. Must last 30 days 05/20/19 06/19/19  Milinda Pointer, MD  Oxycodone HCl 10 MG TABS Take 1 tablet (10 mg total) by mouth every 6 (six) hours as  needed. Must last 30 days 06/19/19 07/19/19  Milinda Pointer, MD  SPIRIVA HANDIHALER 18 MCG inhalation capsule PLACE 1 CAPSULE (18 MCG TOTAL) INTO INHALER AND INHALE ONCE DAILY. 12/15/15   [provider]  tiZANidine (ZANAFLEX) 4 MG tablet Take 1 tablet (4 mg total) by mouth every 8 (eight) hours as needed for muscle spasms. Max: 3/day 04/20/19 07/19/19  Milinda Pointer, MD    Allergies Aripiprazole, Duloxetine, Gabapentin, Gold, Iodinated diagnostic agents, Naproxen sodium, Nortriptyline hcl, Nsaids, Pregabalin, Meperidine, Ace inhibitors, Aspirin, Cefpodoxime, Fluoxetine, Fluticasone-salmeterol, Levofloxacin, Lithium, Paroxetine, Tape, Telithromycin, Theophylline, Topiramate, Trazodone, Triamcinolone, Venlafaxine, Bupropion, Ketorolac  tromethamine, Morphine, and Moxifloxacin  Family History  Problem Relation Age of Onset  . Gout Mother   . Cancer Mother   . Heart disease Father     Social History Social History   Tobacco Use  . Smoking status: Current Every Day Smoker    Packs/day: 2.00    Years: 40.00    Pack years: 80.00    Types: Cigarettes  . Smokeless tobacco: Never Used  Substance Use Topics  . Alcohol use: No    Alcohol/week: 0.0 standard drinks    Comment: rarely  . Drug use: No    Review of Systems Constitutional: No fever/chills Eyes: No visual changes. ENT: No sore throat. Cardiovascular: Positive for chest tightness.  Respiratory: Positive for shortness of breath. Gastrointestinal: No abdominal pain.  No nausea, no vomiting.  No diarrhea.   Genitourinary: Negative for dysuria. Musculoskeletal: Positive for back and shoulder pain.  Skin: Negative for rash. Neurological: Negative for headaches, focal weakness or numbness.  ____________________________________________   PHYSICAL EXAM:  VITAL SIGNS: ED Triage Vitals  Enc Vitals Group     BP 04/28/19 0013 136/80     Pulse Rate 04/28/19 0013 95     Resp 04/28/19 0013 20     Temp --      Temp Source 04/28/19 0013 Oral     SpO2 04/28/19 0013 95 %     Weight 04/28/19 0014 250 lb (113.4 kg)     Height 04/28/19 0014 5\' 5"  (1.651 m)     Head Circumference --      Peak Flow --      Pain Score 04/28/19 0013 5   Constitutional: Alert and oriented.  Eyes: Conjunctivae are normal.  ENT      Head: Normocephalic and atraumatic.      Nose: No congestion/rhinnorhea.      Mouth/Throat: Mucous membranes are moist.      Neck: No stridor. Hematological/Lymphatic/Immunilogical: No cervical lymphadenopathy. Cardiovascular: Normal rate, regular rhythm.  No murmurs, rubs, or gallops.  Respiratory: Normal respiratory effort without tachypnea nor retractions. Diffuse expiratory wheezing.  Gastrointestinal: Soft and non tender. No rebound. No  guarding.  Genitourinary: Deferred Musculoskeletal: Normal range of motion in all extremities. No lower extremity edema. Neurologic:  Normal speech and language. No gross focal neurologic deficits are appreciated.  Skin:  Skin is warm, dry and intact. No rash noted. Psychiatric: Mood and affect are normal. Speech and behavior are normal. Patient exhibits appropriate insight and judgment.  ____________________________________________    LABS (pertinent positives/negatives)  CMP wnl except glu 106 CBC wbc 13.5, hgb 13.7, plt 318 ____________________________________________   EKG  I, Nance Pear, attending physician, personally viewed and interpreted this EKG  EKG Time: 0016 Rate: 95 Rhythm: normal sinus rhythm Axis: normal Intervals: qtc 442 QRS: incomplete RBBB ST changes: no st elevation Impression: abnormal ekg   ____________________________________________    RADIOLOGY  CXR  No acute abnormality  ____________________________________________   PROCEDURES  Procedures  ____________________________________________   INITIAL IMPRESSION / ASSESSMENT AND PLAN / ED COURSE  Pertinent labs & imaging results that were available during my care of the patient were reviewed by me and considered in my medical decision making (see chart for details).   Patient presented to the emergency department tonight because of concern for cough and shortness of breath. The patient did have expiratory wheezing on exam. CXR without concerning findings for pneumonia. Will plan on discharging with steroids and cough medication.   ____________________________________________   FINAL CLINICAL IMPRESSION(S) / ED DIAGNOSES  Final diagnoses:  Bronchitis  Cough  COPD exacerbation ([place])     Note: This dictation was prepared with Dragon dictation. Any transcriptional errors that result from this process are unintentional     Nance Pear, MD 04/28/19 [id number]

## 2019-04-28 NOTE — ED Notes (Signed)
Lab results reviewed. CXR results reviewed. Awaiting room for MD eval.

## 2019-04-29 LAB — NOVEL CORONAVIRUS, NAA (HOSP ORDER, SEND-OUT TO REF LAB; TAT 18-24 HRS): SARS-CoV-2, NAA: NOT DETECTED

## 2019-05-13 ENCOUNTER — Encounter: Payer: Self-pay | Admitting: Pain Medicine

## 2019-05-13 ENCOUNTER — Ambulatory Visit (HOSPITAL_BASED_OUTPATIENT_CLINIC_OR_DEPARTMENT_OTHER): Payer: Medicaid Other | Admitting: Pain Medicine

## 2019-05-13 ENCOUNTER — Ambulatory Visit
Admission: RE | Admit: 2019-05-13 | Discharge: 2019-05-13 | Disposition: A | Discharge status: Home / Self Care | Payer: Medicaid Other | Source: Ambulatory Visit | Attending: Pain Medicine | Admitting: Pain Medicine

## 2019-05-13 ENCOUNTER — Other Ambulatory Visit: Payer: Self-pay

## 2019-05-13 VITALS — BP 138/86 | HR 87 | Temp 98.1°F | Resp 22 | Ht 65.0 in | Wt 146.0 lb

## 2019-05-13 DIAGNOSIS — Z91041 Radiographic dye allergy status: Secondary | ICD-10-CM | POA: Insufficient documentation

## 2019-05-13 DIAGNOSIS — M25511 Pain in right shoulder: Secondary | ICD-10-CM | POA: Diagnosis not present

## 2019-05-13 DIAGNOSIS — G8918 Other acute postprocedural pain: Secondary | ICD-10-CM

## 2019-05-13 DIAGNOSIS — M19011 Primary osteoarthritis, right shoulder: Secondary | ICD-10-CM | POA: Insufficient documentation

## 2019-05-13 DIAGNOSIS — M25512 Pain in left shoulder: Secondary | ICD-10-CM | POA: Insufficient documentation

## 2019-05-13 DIAGNOSIS — Z96619 Presence of unspecified artificial shoulder joint: Secondary | ICD-10-CM

## 2019-05-13 DIAGNOSIS — M19012 Primary osteoarthritis, left shoulder: Secondary | ICD-10-CM | POA: Diagnosis present

## 2019-05-13 DIAGNOSIS — Z96611 Presence of right artificial shoulder joint: Secondary | ICD-10-CM | POA: Diagnosis not present

## 2019-05-13 DIAGNOSIS — G8929 Other chronic pain: Secondary | ICD-10-CM

## 2019-05-13 DIAGNOSIS — M25519 Pain in unspecified shoulder: Secondary | ICD-10-CM | POA: Insufficient documentation

## 2019-05-13 MED ORDER — FENTANYL CITRATE (PF) 100 MCG/2ML IJ SOLN
25.0000 ug | INTRAMUSCULAR | Status: DC | PRN
  Administered 2019-05-13: 100 ug via INTRAVENOUS
  Filled 2019-05-13: qty 2

## 2019-05-13 MED ORDER — HYDROCODONE-ACETAMINOPHEN 5-325 MG PO TABS: 1.0000 | ORAL_TABLET | Freq: Four times a day (QID) | ORAL | 0 refills | Status: AC | PRN

## 2019-05-13 MED ORDER — LIDOCAINE HCL 2 % IJ SOLN
20.0000 mL | Freq: Once | INTRAMUSCULAR | Status: AC
  Administered 2019-05-13: 13:00:00 400 mg
  Filled 2019-05-13: qty 20

## 2019-05-13 MED ORDER — ROPIVACAINE HCL 2 MG/ML IJ SOLN
9.0000 mL | Freq: Once | INTRAMUSCULAR | Status: AC
  Administered 2019-05-13: 13:00:00 10 mL via PERINEURAL
  Filled 2019-05-13: qty 10

## 2019-05-13 MED ORDER — LACTATED RINGERS IV SOLN
1000.0000 mL | Freq: Once | INTRAVENOUS | Status: AC
  Administered 2019-05-13: 1000 mL via INTRAVENOUS

## 2019-05-13 MED ORDER — MIDAZOLAM HCL 5 MG/5ML IJ SOLN
1.0000 mg | INTRAMUSCULAR | Status: DC | PRN
  Administered 2019-05-13: 14:00:00 3 mg via INTRAVENOUS
  Filled 2019-05-13: qty 5

## 2019-05-13 MED ORDER — METHYLPREDNISOLONE ACETATE 80 MG/ML IJ SUSP
80.0000 mg | Freq: Once | INTRAMUSCULAR | Status: AC
  Administered 2019-05-13: 13:00:00 80 mg
  Filled 2019-05-13: qty 1

## 2019-05-13 NOTE — Progress Notes (Signed)
Safety precautions to be maintained throughout the outpatient stay will include: orient to surroundings, keep bed in low position, maintain call bell within reach at all times, provide assistance with transfer out of bed and ambulation.  

## 2019-05-13 NOTE — Patient Instructions (Addendum)
___________________________________________________________________________________________  Post-Radiofrequency (RF) Discharge Instructions  You have just completed a Radiofrequency Neurotomy.  The following instructions will provide you with information and guidelines for self-care upon discharge.  If at any time you have questions or concerns please call your physician. DO NOT DRIVE YOURSELF!!  Instructions:  Apply ice: Fill a plastic sandwich bag with crushed ice. Cover it with a small towel and apply to injection site. Apply for 15 minutes then remove x 15 minutes. Repeat sequence on day of procedure, until you go to bed. The purpose is to minimize swelling and discomfort after procedure.  Apply heat: Apply heat to procedure site starting the day following the procedure. The purpose is to treat any soreness and discomfort from the procedure.  Food intake: No eating limitations, unless stipulated above.  Nevertheless, if you have had sedation, you may experience some nausea.  In this case, it may be wise to wait at least two hours prior to resuming regular diet.  Physical activities: Keep activities to a minimum for the first 8 hours after the procedure. For the first 24 hours after the procedure, do not drive a motor vehicle,  Operate heavy machinery, power tools, or handle any weapons.  Consider walking with the use of an assistive device or accompanied by an adult for the first 24 hours.  Do not drink alcoholic beverages including beer.  Do not make any important decisions or sign any legal documents. Go home and rest today.  Resume activities tomorrow, as tolerated.  Use caution in moving about as you may experience mild leg weakness.  Use caution in cooking, use of household electrical appliances and climbing steps.  Driving: If you have received any sedation, you are not allowed to drive for 24 hours after your procedure.  Blood thinner: Restart your blood thinner 6 hours after your  procedure. (Only for those taking blood thinners)  Insulin: As soon as you can eat, you may resume your normal dosing schedule. (Only for those taking insulin)  Medications: May resume pre-procedure medications.  Do not take any drugs, other than what has been prescribed to you.  Infection prevention: Keep procedure site clean and dry.  Post-procedure Pain Diary: Extremely important that this be done correctly and accurately. Recorded information will be used to determine the next step in treatment.  Pain evaluated is that of treated area only. Do not include pain from an untreated area.  Complete every hour, on the hour, for the initial 8 hours. Set an alarm to help you do this part accurately.  Do not go to sleep and have it completed later. It will not be accurate.  Follow-up appointment: Keep your follow-up appointment after the procedure. Usually 2 weeks for most procedures. (6 weeks in the case of radiofrequency.) Bring you pain diary.   Expect:  From numbing medicine (AKA: Local Anesthetics): Numbness or decrease in pain.  Onset: Full effect within 15 minutes of injected.  Duration: It will depend on the type of local anesthetic used. On the average, 1 to 8 hours.   From steroids: Decrease in swelling or inflammation. Once inflammation is improved, relief of the pain will follow.  Onset of benefits: Depends on the amount of swelling present. The more swelling, the longer it will take for the benefits to be seen. In some cases, up to 10 days.  Duration: Steroids will stay in the system x 2 weeks. Duration of benefits will depend on multiple posibilities including persistent irritating factors.  From procedure: Some   discomfort is to be expected once the numbing medicine wears off. This should be minimal if ice and heat are applied as instructed.  Call if:  You experience numbness and weakness that gets worse with time, as opposed to wearing off.  He experience any unusual  bleeding, difficulty breathing, or loss of the ability to control your bowel and bladder. (This applies to Spinal procedures only)  You experience any redness, swelling, heat, red streaks, elevated temperature, fever, or any other signs of a possible infection.  Emergency Numbers:  [place] hours (Monday - Thursday, 8:00 AM - 4:00 PM) (Friday, 9:00 AM - 12:00 Noon): [phone]  After hours: [phone] ____________________________________________________________________________________________   PA resubmitted with correct information.  Would have been approved from the 10/12 PA that was sent but qty was left blank.   Please give at least 24 hours from 05/13/19 @ 2 p.m. to have the pharmacy rerun the Rx.   Hydrocodone - apap 5-325 mg x 2 Rx's sent to pharmacy.  1 to fill on 05/13/19 and the other to fill on 05/20/19 for breakthrough Radiofrequency pain.   Ice

## 2019-05-13 NOTE — Progress Notes (Signed)
Patient's Name: Danielle [name]  MRN: [id number]  Referring Provider: Marygrace Drought, MD  DOB: 1961-06-07  PCP: Marygrace Drought, MD  DOS: 05/13/2019  Note by: Gaspar Cola, MD  Service setting: Ambulatory outpatient  Specialty: Interventional Pain Management  Patient type: Established  Location: [facility] (AMB) Pain Management Facility  Visit type: Interventional Procedure   Primary Reason for Visit: Interventional Pain Management Treatment. CC: Shoulder Pain  Procedure:          Anesthesia, Analgesia, Anxiolysis:  Type: Suprascapular nerve Radiofrequency Ablation #1  Primary Purpose: Therapeutic Region: Posterior Shoulder & Scapular Areas Level: Superior to the scapular spine, in the lateral aspect of the supraspinatus fossa (Suprescapular notch). Target Area: Suprascapular nerve as it passes thru the lower portion of the suprascapular notch. Approach: Posterior percutaneous approach. Laterality: Bilateral  Type: Moderate (Conscious) Sedation combined with Local Anesthesia Indication(s): Analgesia and Anxiety Route: Intravenous (IV) IV Access: Secured Sedation: Meaningful verbal contact was maintained at all times during the procedure  Local Anesthetic: Lidocaine 1-2%  Position: Prone   Indications: 1. Chronic shoulder pain (Primary Area of Pain) (Bilateral) (R>L)   2. Osteoarthritis of shoulder (Bilateral)   3. S/P total replacement of shoulder (Right)   4. Pain in shoulder region after shoulder replacement   5. History of allergy to iodine & radiographic dye    Ms. Jimison has been dealing with the above chronic pain for longer than three months and has either failed to respond, was unable to tolerate, or simply did not get enough benefit from other more conservative therapies including, but not limited to: 1. Over-the-counter medications 2. Anti-inflammatory medications 3. Muscle relaxants 4. Membrane stabilizers 5. Opioids 6. Physical therapy and/or chiropractic  manipulation 7. Modalities (Heat, ice, etc.) 8. Invasive techniques such as nerve blocks. Ms. Ristine has attained more than 50% relief of the pain from a series of diagnostic injections conducted in separate occasions.  Pain Score: Pre-procedure: 3 /10 Post-procedure: 0-No pain/10  Pre-op Assessment:  Ms. Giorgianni is a 58 y.o. (year old), female patient, seen today for interventional treatment. She  has a past surgical history that includes necksurgery (10/21/2014); Back surgery (2013); Foot surgery (Right); Elbow surgery (Left); Carpal tunnel release (Right); Nose surgery; Partial hysterectomy; Total shoulder replacement (Bilateral); and Colonoscopy with propofol (N/A, 02/05/2017). Ms. Raso has a current medication list which includes the following prescription(s): albuterol, cholecalciferol, citalopram, diclofenac sodium, docusate sodium, dulera, guaifenesin-codeine, hydrocodone-acetaminophen, hydrocodone-acetaminophen, levothyroxine, loratadine, magnesium hydroxide, quad cane, naloxone, vios lc sprint deluxe, NONFORMULARY OR COMPOUNDED ITEM, nystatin, oxycodone hcl, oxycodone hcl, oxycodone hcl, prednisone, spiriva handihaler, tizanidine, and cetirizine, and the following Facility-Administered Medications: fentanyl and midazolam. Her primarily concern today is the Shoulder Pain  Initial Vital Signs:  Pulse/HCG Rate: 87ECG Heart Rate: 84 Temp: 98.4 F (36.9 C) Resp: 14 BP: (!) 119/94 SpO2: 93 %  BMI: Estimated body mass index is 24.3 kg/m as calculated from the following:   Height as of this encounter: 5\' 5"  (1.651 m).   Weight as of this encounter: 146 lb (66.2 kg).  Risk Assessment: Allergies: Reviewed. She is allergic to aripiprazole; duloxetine; gabapentin; gold; iodinated diagnostic agents; naproxen sodium; nortriptyline hcl; nsaids; pregabalin; meperidine; ace inhibitors; aspirin; cefpodoxime; fluoxetine; fluticasone-salmeterol; levofloxacin; lithium; paroxetine; tape;  telithromycin; theophylline; topiramate; trazodone; triamcinolone; venlafaxine; bupropion; ketorolac tromethamine; morphine; and moxifloxacin.  Allergy Precautions: None required Coagulopathies: Reviewed. None identified.  Blood-thinner therapy: None at this time Active Infection(s): Reviewed. None identified. Ms. Toscano is afebrile  Site Confirmation: Ms. Lacayo was asked to confirm  the procedure and laterality before marking the site Procedure checklist: Completed Consent: Before the procedure and under the influence of no sedative(s), amnesic(s), or anxiolytics, the patient was informed of the treatment options, risks and possible complications. To fulfill our ethical and legal obligations, as recommended by the American Medical Association's Code of Ethics, I have informed the patient of my clinical impression; the nature and purpose of the treatment or procedure; the risks, benefits, and possible complications of the intervention; the alternatives, including doing nothing; the risk(s) and benefit(s) of the alternative treatment(s) or procedure(s); and the risk(s) and benefit(s) of doing nothing. The patient was provided information about the general risks and possible complications associated with the procedure. These may include, but are not limited to: failure to achieve desired goals, infection, bleeding, organ or nerve damage, allergic reactions, paralysis, and death. In addition, the patient was informed of those risks and complications associated to the procedure, such as failure to decrease pain; infection; bleeding; organ or nerve damage with subsequent damage to sensory, motor, and/or autonomic systems, resulting in permanent pain, numbness, and/or weakness of one or several areas of the body; allergic reactions; (i.e.: anaphylactic reaction); and/or death. Furthermore, the patient was informed of those risks and complications associated with the medications. These include, but are not  limited to: allergic reactions (i.e.: anaphylactic or anaphylactoid reaction(s)); adrenal axis suppression; blood sugar elevation that in diabetics may result in ketoacidosis or comma; water retention that in patients with history of congestive heart failure may result in shortness of breath, pulmonary edema, and decompensation with resultant heart failure; weight gain; swelling or edema; medication-induced neural toxicity; particulate matter embolism and blood vessel occlusion with resultant organ, and/or nervous system infarction; and/or aseptic necrosis of one or more joints. Finally, the patient was informed that Medicine is not an exact science; therefore, there is also the possibility of unforeseen or unpredictable risks and/or possible complications that may result in a catastrophic outcome. The patient indicated having understood very clearly. We have given the patient no guarantees and we have made no promises. Enough time was given to the patient to ask questions, all of which were answered to the patient's satisfaction. Ms. Levings has indicated that she wanted to continue with the procedure. Attestation: I, the ordering provider, attest that I have discussed with the patient the benefits, risks, side-effects, alternatives, likelihood of achieving goals, and potential problems during recovery for the procedure that I have provided informed consent. Date   Time: 05/13/2019  1:02 PM  Pre-Procedure Preparation:  Monitoring: As per clinic protocol. Respiration, ETCO2, SpO2, BP, heart rate and rhythm monitor placed and checked for adequate function Safety Precautions: Patient was assessed for positional comfort and pressure points before starting the procedure. Time-out: I initiated and conducted the "Time-out" before starting the procedure, as per protocol. The patient was asked to participate by confirming the accuracy of the "Time Out" information. Verification of the correct person, site, and  procedure were performed and confirmed by me, the nursing staff, and the patient. "Time-out" conducted as per Joint Commission's Universal Protocol (UP.01.01.01). Time: 1335  Description of Procedure:          Area Prepped: Entire shoulder and scapular area Prepping solution: DuraPrep (Iodine Povacrylex [0.7% available iodine] and Isopropyl Alcohol, 74% w/w) Safety Precautions: Aspiration looking for blood return was conducted prior to all injections. At no point did we inject any substances, as a needle was being advanced. No attempts were made at seeking any paresthesias. Safe injection practices  and needle disposal techniques used. Medications properly checked for expiration dates. SDV (single dose vial) medications used. Description of the Procedure: Protocol guidelines were followed. The patient was placed in position over the procedure table. The target area was identified and the area prepped in the usual manner. The skin and muscle were infiltrated with local anesthetic. Appropriate amount of time allowed to pass for local anesthetics to take effect. Radiofrequency needles were introduced to the target area using fluoroscopic guidance. Using the NeuroTherm NT1100 Radiofrequency Generator, sensory stimulation using 50 Hz was used to locate & identify the nerve, making sure that the needle was positioned such that there was no sensory stimulation below 0.3 V or above 0.7 V. Stimulation using 2 Hz was used to evaluate the motor component. Care was taken not to lesion any nerves that demonstrated motor stimulation of the lower extremities at an output of less than 2.5 times that of the sensory threshold, or a maximum of 2.0 V. Once satisfactory placement of the needles was achieved, the numbing solution was slowly injected after negative aspiration. After waiting for at least 2 minutes, the ablation was performed at 80 degrees C for 60 seconds, using regular Radiofrequency settings. Once the procedure was  completed, the needles were then removed and the area cleansed, making sure to leave some of the prepping solution back to take advantage of its long term bactericidal properties. Intra-operative Compliance: Compliant   Vitals:   05/13/19 1407 05/13/19 1410 05/13/19 1417 05/13/19 1427  BP: 126/80  119/64 138/86  Pulse:      Resp: 16  12 (!) 22  Temp: 97.6 F (36.4 C)   98.1 F (36.7 C)  TempSrc: Temporal   Temporal  SpO2: 98% 92% 97% 98%  Weight:      Height:        Start Time: 1335 hrs. End Time: 1354 hrs.  Materials & Medications:  Needle(s) Type: Teflon-coated, curved tip, Radiofrequency needle(s) Gauge: 22G Length: 10cm Medication(s): Please see orders for medications and dosing details.  Imaging Guidance (Non-Spinal):          Type of Imaging Technique: Fluoroscopy Guidance (Non-Spinal) Indication(s): Assistance in needle guidance and placement for procedures requiring needle placement in or near specific anatomical locations not easily accessible without such assistance. Exposure Time: Please see nurses notes. Contrast: Before injecting any contrast, we confirmed that the patient did not have an allergy to iodine, shellfish, or radiological contrast. Once satisfactory needle placement was completed at the desired level, radiological contrast was injected. Contrast injected under live fluoroscopy. No contrast complications. See chart for type and volume of contrast used. Fluoroscopic Guidance: I was personally present during the use of fluoroscopy. "Tunnel Vision Technique" used to obtain the best possible view of the target area. Parallax error corrected before commencing the procedure. "Direction-depth-direction" technique used to introduce the needle under continuous pulsed fluoroscopy. Once target was reached, antero-posterior, oblique, and lateral fluoroscopic projection used confirm needle placement in all planes. Images permanently stored in EMR. Interpretation: I  personally interpreted the imaging intraoperatively. Adequate needle placement confirmed in multiple planes. Appropriate spread of contrast into desired area was observed. No evidence of afferent or efferent intravascular uptake. Permanent images saved into the patient's record.  Antibiotic Prophylaxis:   Anti-infectives (From admission, onward)   None     Indication(s): None identified  Post-operative Assessment:  Post-procedure Vital Signs:  Pulse/HCG Rate: 8786 Temp: 98.1 F (36.7 C) Resp: (!) 22 BP: 138/86 SpO2: 98 %  EBL: None  Complications: No immediate post-treatment complications observed by team, or reported by patient.  Note: The patient tolerated the entire procedure well. A repeat set of vitals were taken after the procedure and the patient was kept under observation following institutional policy, for this type of procedure. Post-procedural neurological assessment was performed, showing return to baseline, prior to discharge. The patient was provided with post-procedure discharge instructions, including a section on how to identify potential problems. Should any problems arise concerning this procedure, the patient was given instructions to immediately contact us, at any time, without hesitation. In any case, we plan to contact the patient by telephone for a follow-up status report regarding this interventional procedure.  Comments:  No additional relevant information.  Plan of Care  Orders:  Orders Placed This Encounter  Procedures   Radiofrequency Shoulder Joint    For shoulder pain.    Scheduling Instructions:     Procedure: Suprascapular Nerve Radiofrequency Ablation     Laterality: Bilateral     Level(s): Suprascapular nerve     Sedation: With Sedation     Scheduling Timeframe: Today    Order Specific Question:   Where will this procedure be performed?    Answer:   [facility]   DG [facility] C-ARM 1-60 MIN NO REPORT    Intraoperative  interpretation by procedural physician at [place].    Standing Status:   Standing    Number of Occurrences:   1    Order Specific Question:   Reason for exam:    Answer:   Assistance in needle guidance and placement for procedures requiring needle placement in or near specific anatomical locations not easily accessible without such assistance.   Informed Consent Details: Physician/Practitioner Attestation; Transcribe to consent form and obtain patient signature    Nursing Order: Transcribe to consent form and obtain patient signature. Note: Always confirm laterality of pain with Ms. Domingo Cocking, before procedure. Procedure: Suprascapular Nerve Radiofrequency Ablation Indication/Reason: Shoulder pain (arthralgia) secondary to shoulder joint problems (arthropathy). Provider Attestation: I, [place] [name], MD, (Pain Management Specialist), the physician/practitioner, attest that I have discussed with the patient the benefits, risks, side effects, alternatives, likelihood of achieving goals and potential problems during recovery for the procedure that I have provided informed consent.   Provide equipment / supplies at bedside    Equipment required: Single use, disposable, "Block Tray"    Standing Status:   Standing    Number of Occurrences:   1    Order Specific Question:   Specify    Answer:   Block Tray   Miscellanous precautions    Standing Status:   Standing    Number of Occurrences:   1   Chronic Opioid Analgesic:  Oxycodone IR 10 mg, 1 tab PO q 6 hrs (40 mg/dayof oxycodone) MME/day:60mg /day.   Medications ordered for procedure: Meds ordered this encounter  Medications   lidocaine (XYLOCAINE) 2 % (with pres) injection 400 mg   lactated ringers infusion 1,000 mL   midazolam (VERSED) 5 MG/5ML injection 1-2 mg    Make sure Flumazenil is available in the pyxis when using this medication. If oversedation occurs, administer 0.2 mg IV over 15 sec. If after 45 sec no  response, administer 0.2 mg again over 1 min; may repeat at 1 min intervals; not to exceed 4 doses (1 mg)   fentaNYL (SUBLIMAZE) injection 25-50 mcg    Make sure Narcan is available in the pyxis when using this medication. In the event of respiratory  depression (RR< 8/min): Titrate NARCAN (naloxone) in increments of 0.1 to 0.2 mg IV at 2-3 minute intervals, until desired degree of reversal.   ropivacaine (PF) 2 mg/mL (0.2%) (NAROPIN) injection 9 mL   methylPREDNISolone acetate (DEPO-MEDROL) injection 80 mg   HYDROcodone-acetaminophen (NORCO/VICODIN) 5-325 MG tablet    Sig: Take 1 tablet by mouth every 6 (six) hours as needed for up to 7 days for severe pain. Must last 7 days.    Dispense:  28 tablet    Refill:  0    For acute post-operative pain. Not to be refilled. Must last 7 days.   HYDROcodone-acetaminophen (NORCO/VICODIN) 5-325 MG tablet    Sig: Take 1 tablet by mouth every 6 (six) hours as needed for up to 7 days for severe pain. Must last 7 days.    Dispense:  28 tablet    Refill:  0    For acute post-operative pain. Not to be refilled.  Must last 7 days.   Medications administered: We administered lidocaine, lactated ringers, midazolam, fentaNYL, ropivacaine (PF) 2 mg/mL (0.2%), and methylPREDNISolone acetate.  See the medical record for exact dosing, route, and time of administration.  Follow-up plan:   Return in about 6 weeks (around 06/24/2019) for (VV), (PP).       Considering: Possible bilateral suprascapular nerveRFA #1(Today) Possible bilateral hip jointRFA Diagnostic caudal ESI + epidurogram  Possible Racz procedure.  Possible bilateral cervical facetRFA Diagnostic right-sided CESI  Diagnostic left sacroiliac joint block Possible bilateral sacroiliac joint RFA   Palliative PRN treatment(s): Palliative bilateral trochanteric bursa #2  Palliative bilateralsuprascapular nerve block #3 Diagnostic bilateral IA hip joint injection #3 (last done  04/30/2018)  Diagnostic bilateral lumbar facet block #4 Palliative right lumbar facet RFA #3(last done 12/16/2017)  Palliative left lumbar facet RFA #3(last done 02/12/2018)  Diagnostic bilateral cervical facet block #2 Diagnostic right SI joint block #2    Recent Visits Date Type Provider Dept  04/07/19 Office Visit Milinda Pointer, MD [facility]  03/04/19 Office Visit Milinda Pointer, MD [facility]  02/16/19 Procedure visit Milinda Pointer, MD [facility]  Showing recent visits within past 90 days and meeting all other requirements   Today's Visits Date Type Provider Dept  05/13/19 Procedure visit Milinda Pointer, MD [facility]  Showing today's visits and meeting all other requirements   Future Appointments Date Type Provider Dept  06/28/19 Appointment Milinda Pointer, MD [facility]  07/05/19 Appointment Milinda Pointer, MD [facility]  Showing future appointments within next 90 days and meeting all other requirements   Disposition: Discharge home  Discharge Date & Time: 05/13/2019; 1429 hrs.   Primary Care Physician: Marygrace Drought, MD Location: [facility] Note by: Gaspar Cola, MD Date: 05/13/2019; Time: 3:09 PM  Disclaimer:  Medicine is not an Chief Strategy Officer. The only guarantee in medicine is that nothing is guaranteed. It is important to note that the decision to proceed with this intervention was based on the information collected from the patient. The Data and conclusions were drawn from the patient's questionnaire, the interview, and the physical examination. Because the information was provided in large part by the patient, it cannot be guaranteed that it has not been purposely or unconsciously manipulated. Every effort has been made to obtain as much relevant data as possible for this evaluation. It is important to note that the conclusions that lead to  this procedure are derived in large part from the available data. Always take  into account that the treatment will also be dependent on availability of resources and existing treatment guidelines, considered by other Pain Management Practitioners as being common knowledge and practice, at the time of the intervention. For Medico-Legal purposes, it is also important to point out that variation in procedural techniques and pharmacological choices are the acceptable norm. The indications, contraindications, technique, and results of the above procedure should only be interpreted and judged by a Board-Certified Interventional Pain Specialist with extensive familiarity and expertise in the same exact procedure and technique.

## 2019-05-14 ENCOUNTER — Telehealth: Payer: Self-pay | Admitting: *Deleted

## 2019-05-14 NOTE — Telephone Encounter (Signed)
Attempted to call for post procedure follow-up. Message left. 

## 2019-05-26 IMAGING — CR DG HIP (WITH OR WITHOUT PELVIS) 2-3V*R*
4 series · 4 of 4 positions shown · non-contrast
Comparison: Bilateral hip series of January 08, 2016.

CLINICAL DATA: Chronic bilateral hip pain. History of
osteoarthritis

EXAM:
DG HIP (WITH OR WITHOUT PELVIS) 2-3V LEFT; DG HIP (WITH OR WITHOUT
PELVIS) 2-3V RIGHT

[pelvis ap (1 of 2)]
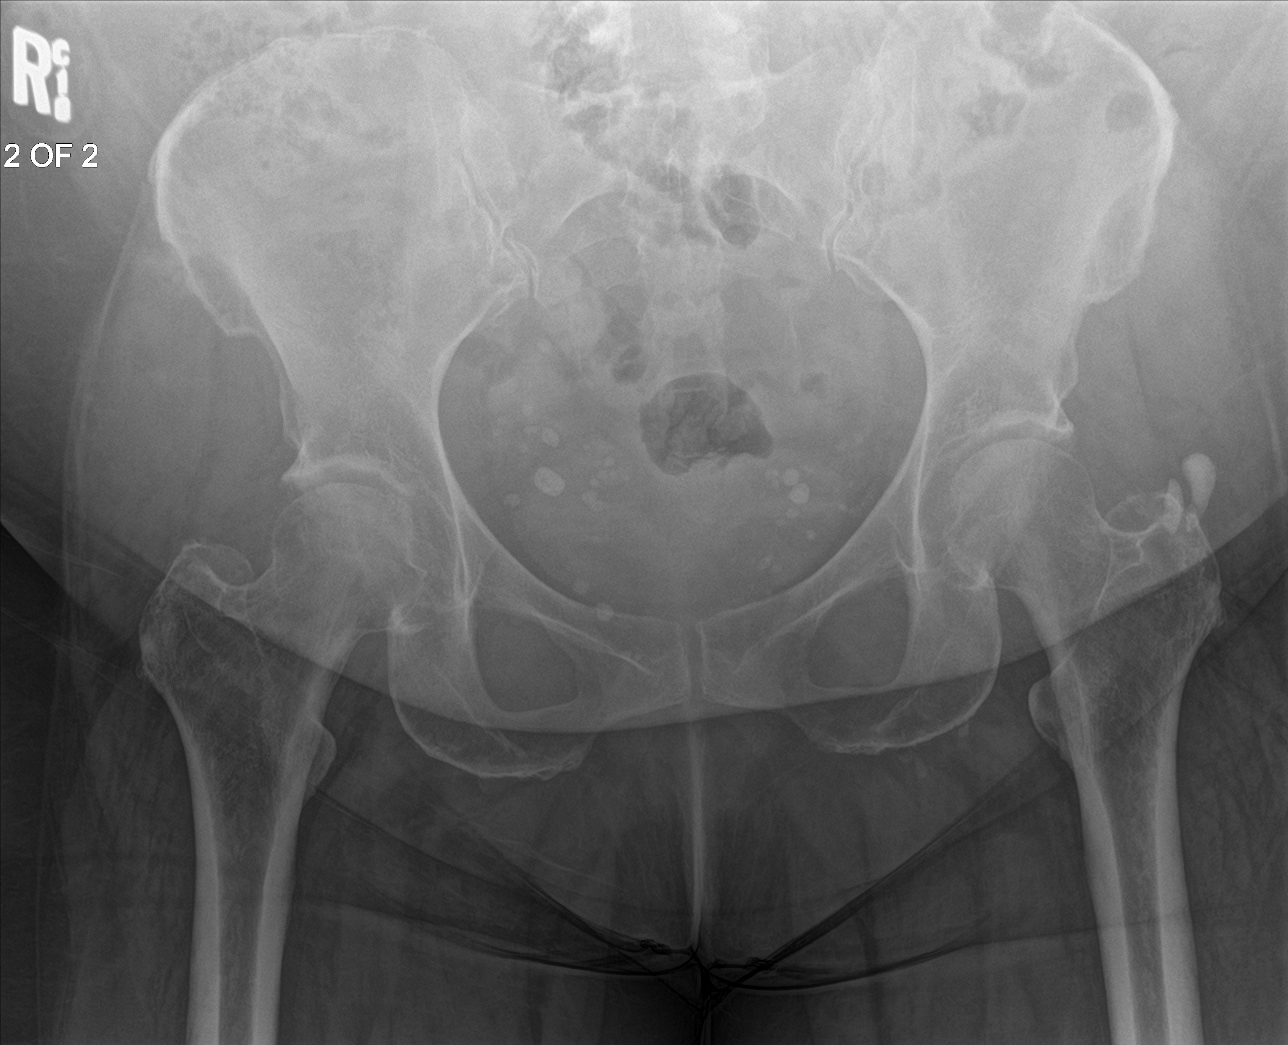

[hip ap]
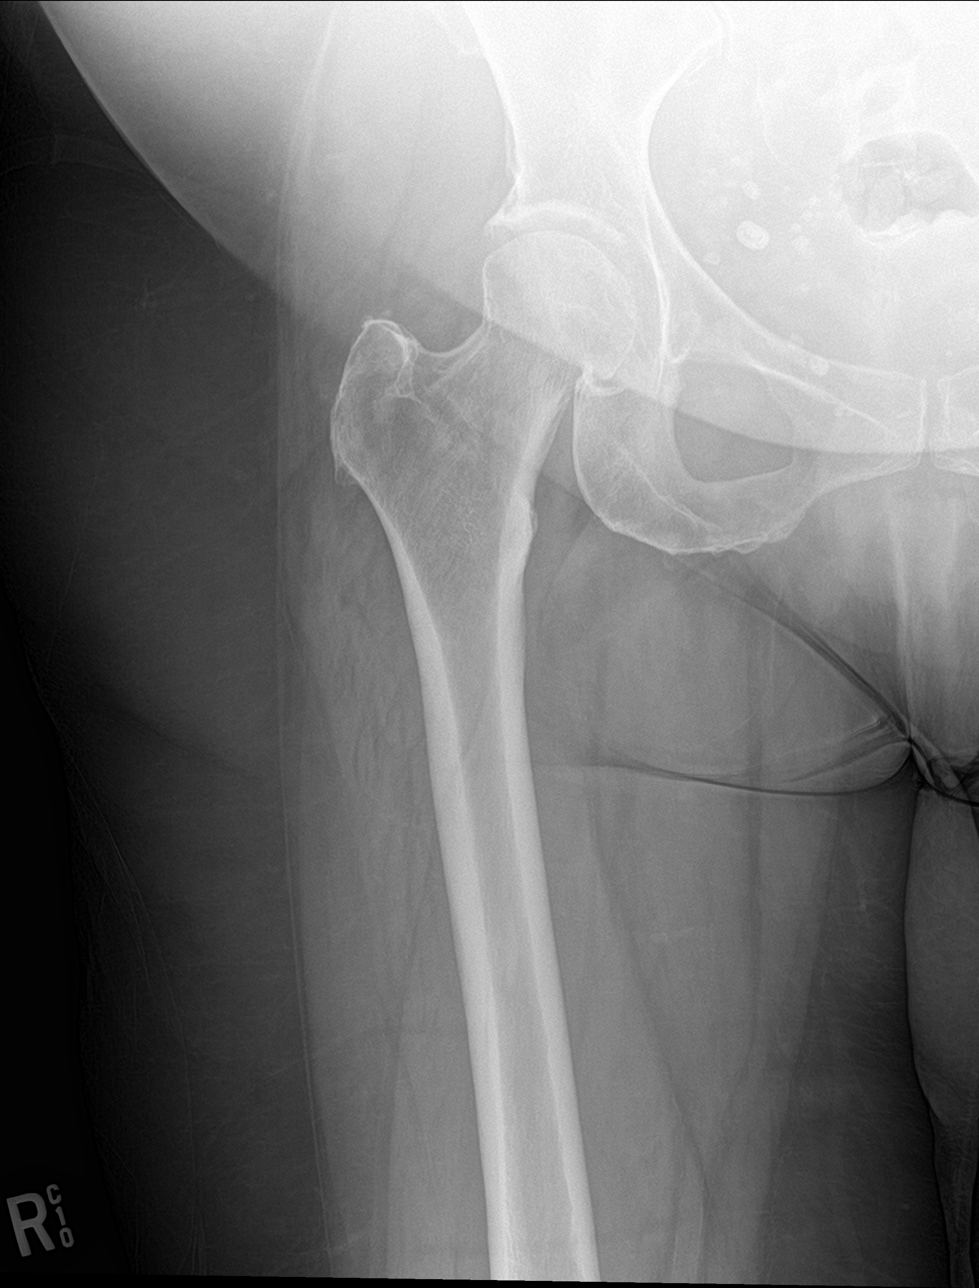

[hip lat]
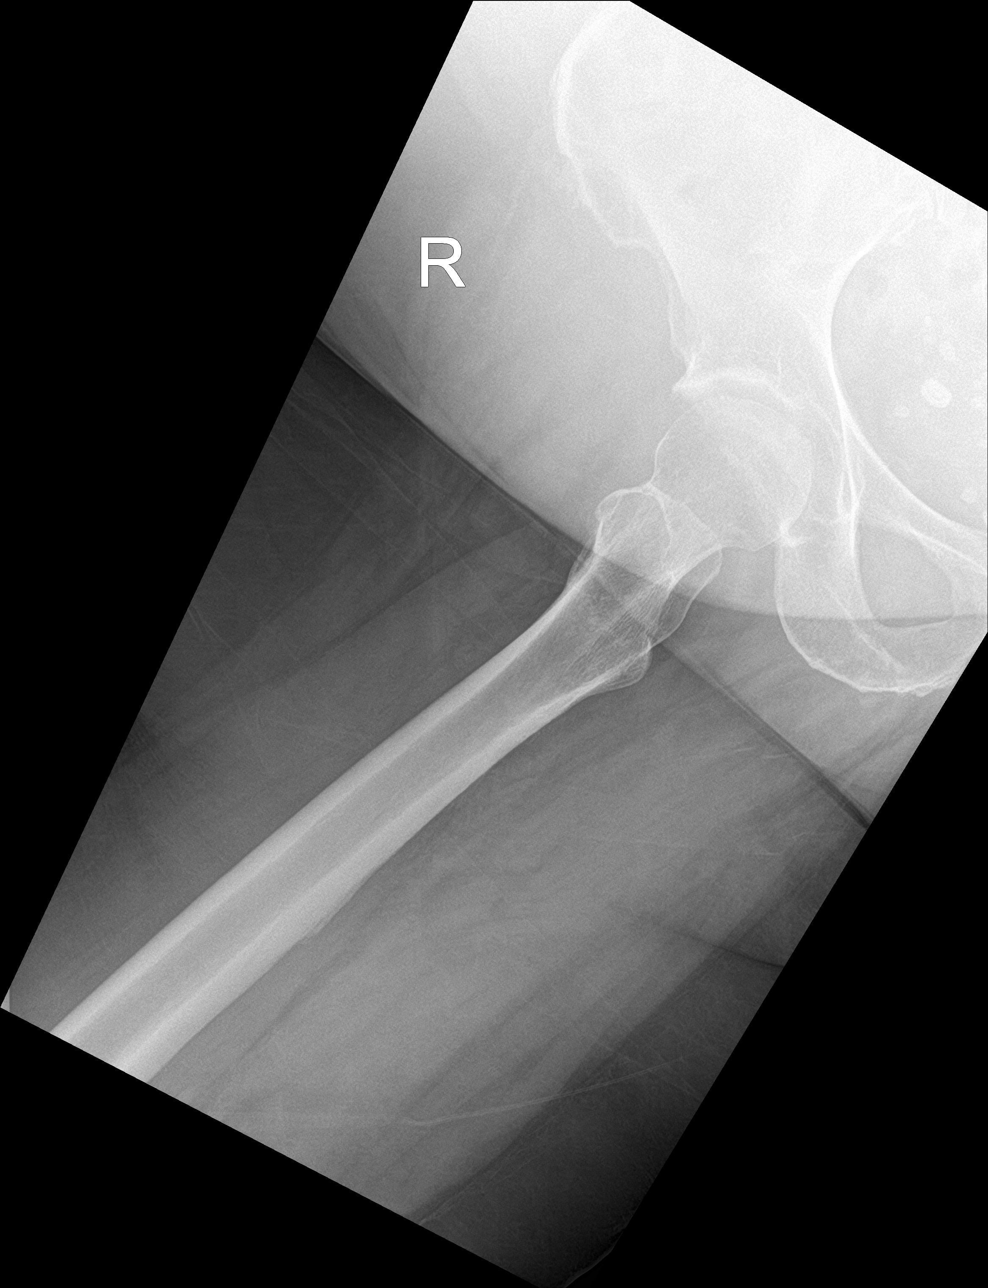

[pelvis ap (2 of 2)]
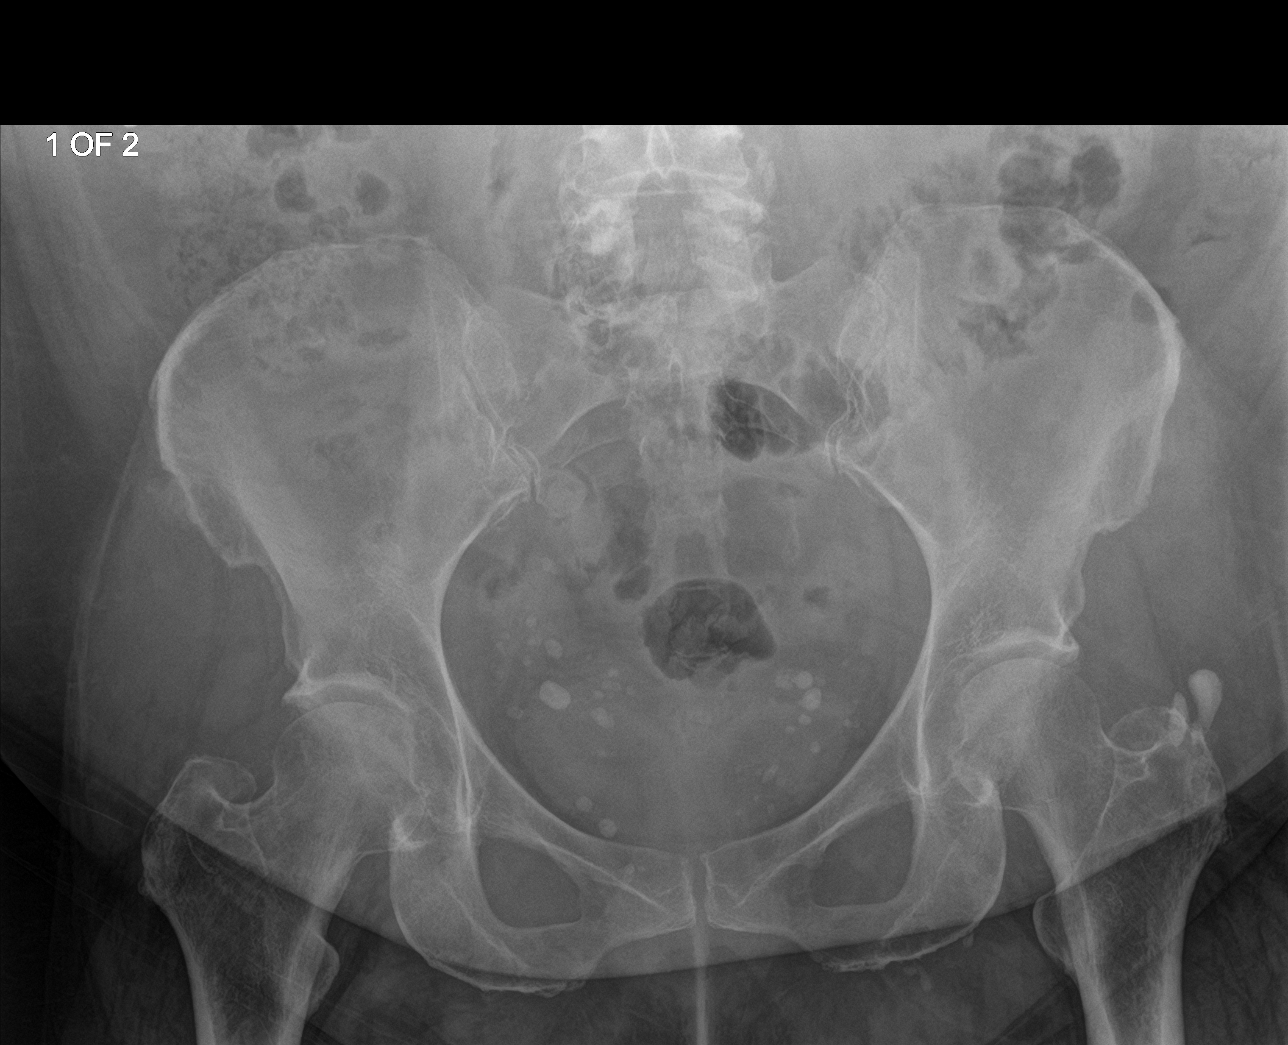

[4 of 4 positions shown; findings below may reference images not displayed]

FINDINGS: The bony pelvis is subjectively osteopenic. There is no lytic nor
blastic lesion. AP and lateral views of both hips reveal
preservation of the joint spaces. The articular surfaces of the
femoral heads and acetabuli E remains smoothly rounded. The femoral
necks, intertrochanteric, and subtrochanteric regions are normal.
There is bursal calcification adjacent to the greater trochanter of
the left femur. No erosive changes are observed. There is no acute
or healing fracture.
IMPRESSION: There is no acute bony abnormality of either hip nor evidence of
significant arthropathic change. There is bursal calcification
adjacent to the greater trochanter on the left.

## 2019-05-26 IMAGING — CR DG HIP (WITH OR WITHOUT PELVIS) 2-3V*L*
4 series · 4 of 4 positions shown · non-contrast
Comparison: Bilateral hip series of January 08, 2016.

CLINICAL DATA: Chronic bilateral hip pain. History of
osteoarthritis

EXAM:
DG HIP (WITH OR WITHOUT PELVIS) 2-3V LEFT; DG HIP (WITH OR WITHOUT
PELVIS) 2-3V RIGHT

[hip ap]
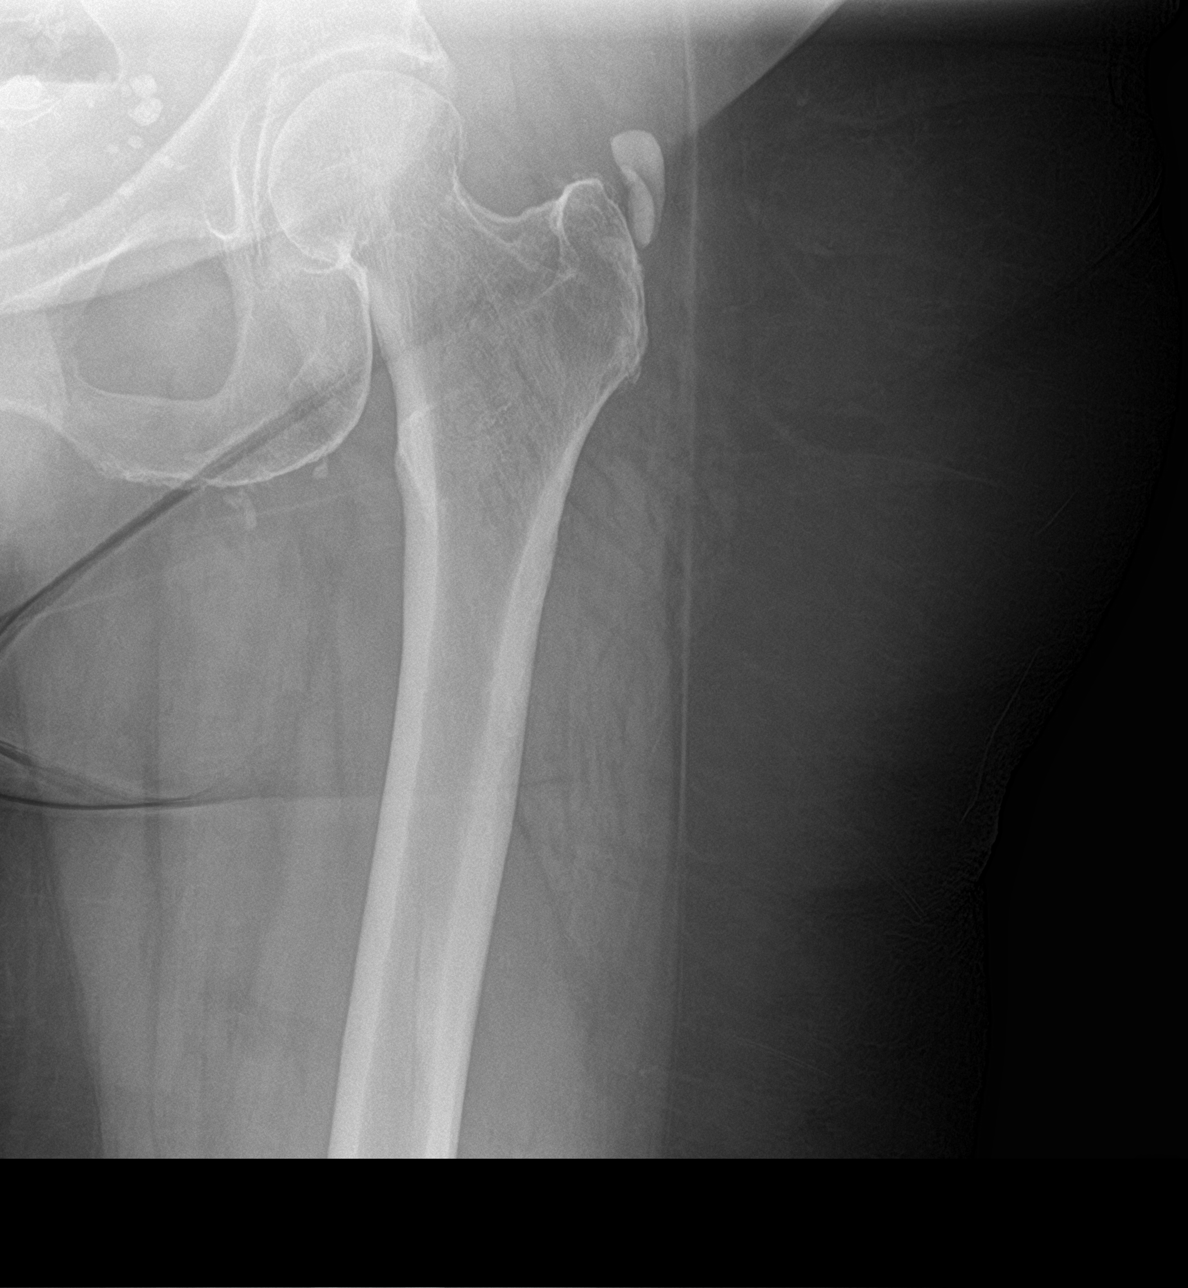

[pelvis ap (1 of 2)]
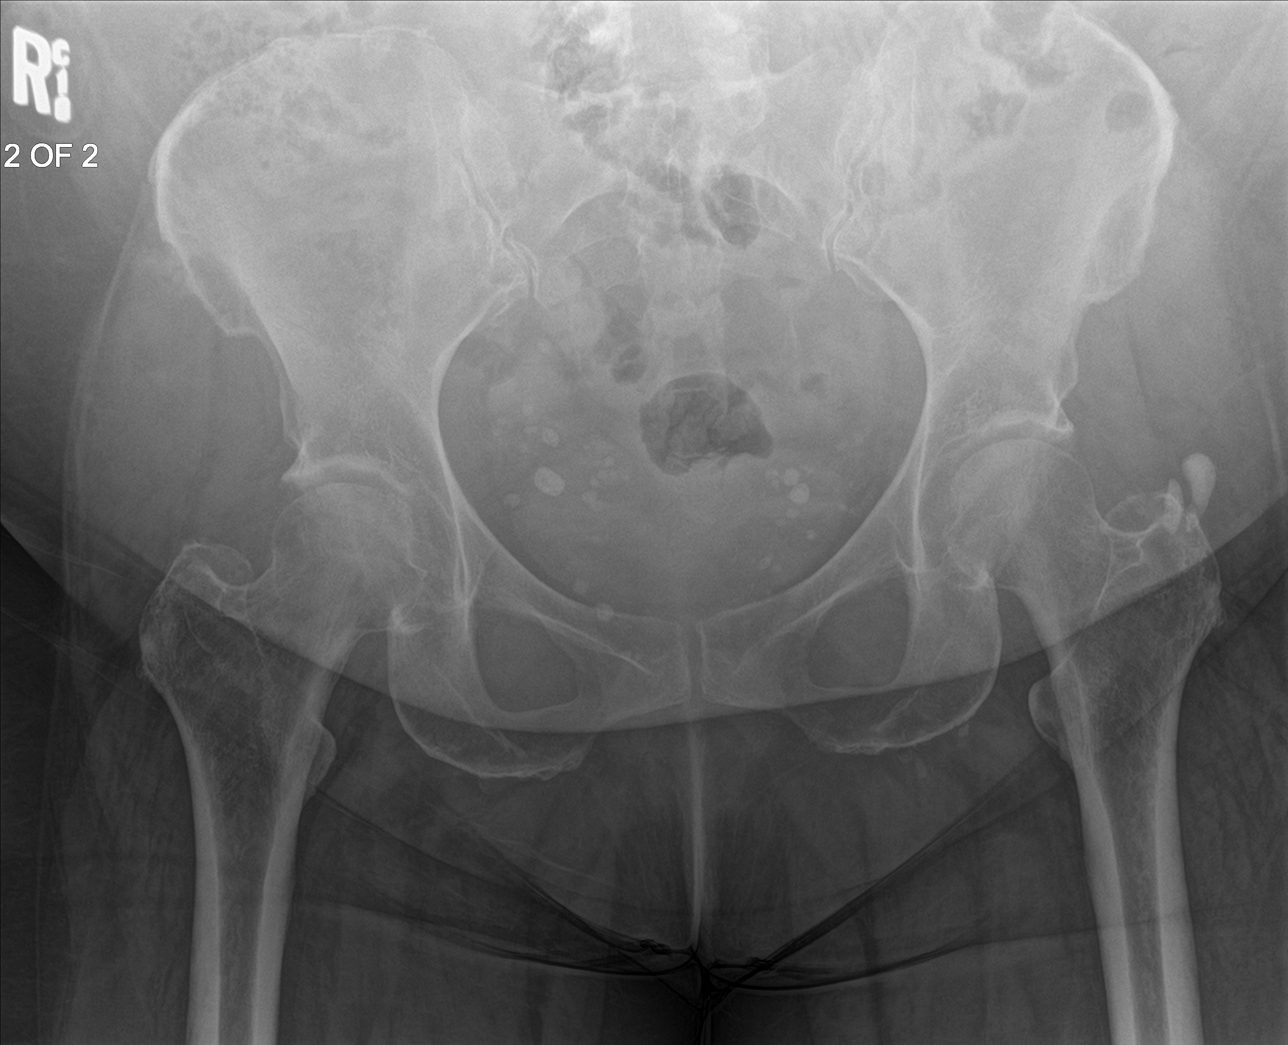

[hip lat]
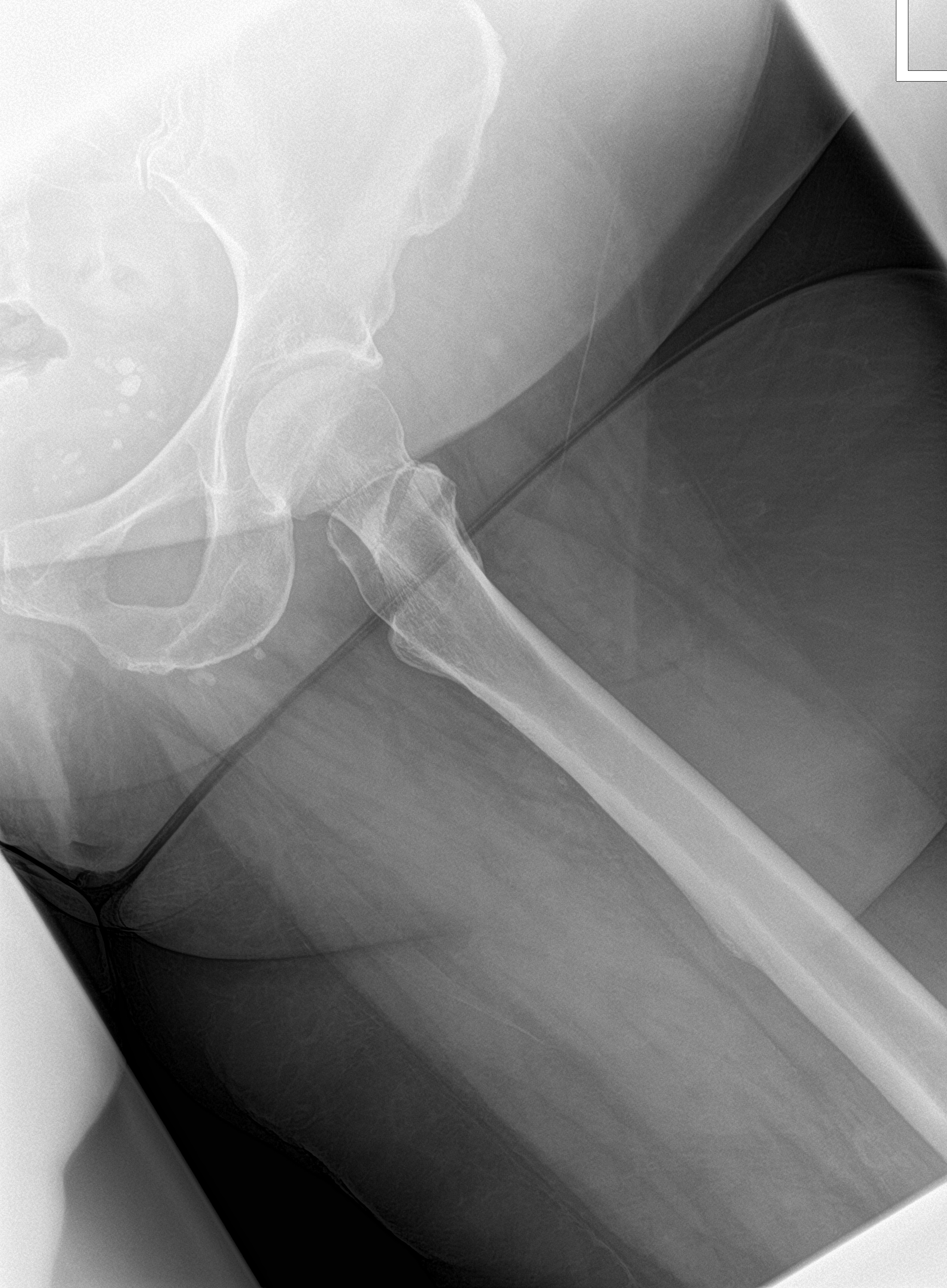

[pelvis ap (2 of 2)]
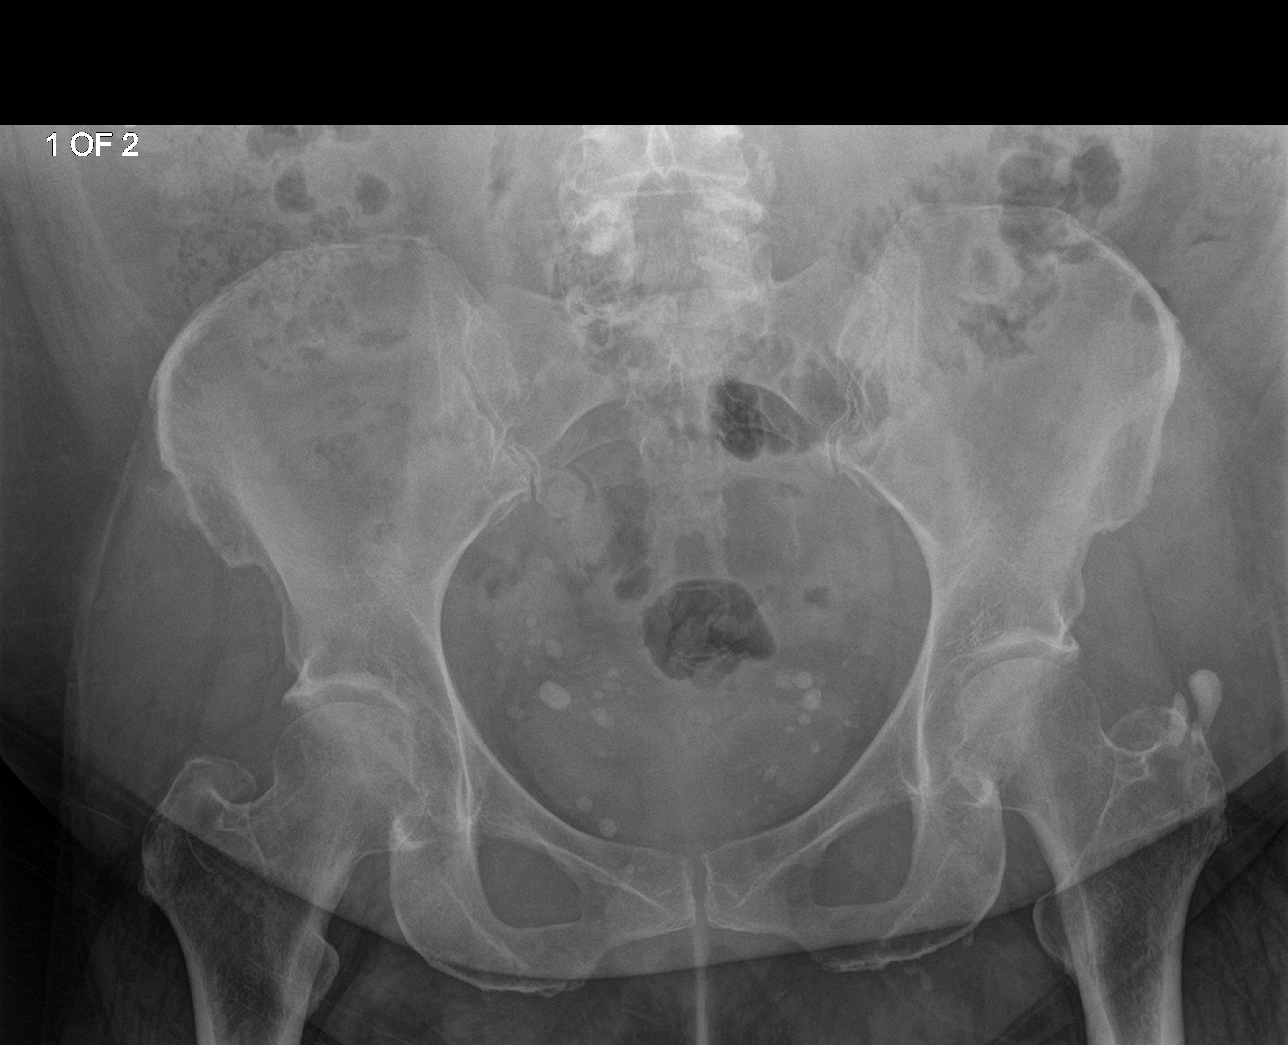

[4 of 4 positions shown; findings below may reference images not displayed]

FINDINGS: The bony pelvis is subjectively osteopenic. There is no lytic nor
blastic lesion. AP and lateral views of both hips reveal
preservation of the joint spaces. The articular surfaces of the
femoral heads and acetabuli E remains smoothly rounded. The femoral
necks, intertrochanteric, and subtrochanteric regions are normal.
There is bursal calcification adjacent to the greater trochanter of
the left femur. No erosive changes are observed. There is no acute
or healing fracture.
IMPRESSION: There is no acute bony abnormality of either hip nor evidence of
significant arthropathic change. There is bursal calcification
adjacent to the greater trochanter on the left.

## 2019-06-24 ENCOUNTER — Telehealth: Payer: Self-pay

## 2019-06-24 ENCOUNTER — Encounter: Payer: Self-pay | Admitting: Pain Medicine

## 2019-06-24 ENCOUNTER — Telehealth: Payer: Self-pay | Admitting: Pain Medicine

## 2019-06-24 NOTE — Telephone Encounter (Signed)
Patient is available to take call

## 2019-06-24 NOTE — Telephone Encounter (Signed)
attempted to call patient for pre virtual appointment questions.  Left message to call us back.

## 2019-06-24 NOTE — Telephone Encounter (Signed)
Left 2nd message for patient to call us back.

## 2019-06-27 NOTE — Progress Notes (Signed)
Pain Management Virtual Encounter Note - Virtual Visit via Telephone Telehealth (real-time audio visits between healthcare provider and patient).   Patient's Phone No. & Preferred Pharmacy:  [phone] (home); [phone] (mobile); (Preferred) [phone] [email or web address]  [place], [place] [address] [place] [address] Phone: [phone] Fax: [phone]    Pre-screening note:  Our staff contacted Danielle [name] and offered her an "in person", "face-to-face" appointment versus a telephone encounter. She indicated preferring the telephone encounter, at this time.   Reason for Virtual Visit: COVID-19*  Social distancing based on CDC and AMA recommendations.   I contacted Danielle [name] on 06/28/2019 via telephone.      I clearly identified myself as Gaspar Cola, MD. I verified that I was speaking with the correct person using two identifiers (Name: Danielle [name], and date of birth: Dec 23, 1960).  Advanced Informed Consent I sought verbal advanced consent from Danielle [name] for virtual visit interactions. I informed Danielle [name] of possible security and privacy concerns, risks, and limitations associated with providing "not-in-person" medical evaluation and management services. I also informed Danielle [name] of the availability of "in-person" appointments. Finally, I informed her that there would be a charge for the virtual visit and that she could be  personally, fully or partially, financially responsible for it. Danielle [name] expressed understanding and agreed to proceed.   Historic Elements   Danielle [name] is a 58 y.o. year old, female patient evaluated today after her last encounter by our practice on 06/24/2019. Danielle [name]  has a past medical history of Acute postoperative pain (01/13/2017), Anxiety, Arthritis, Asthma, Bell's palsy, Bursitis, COPD (chronic obstructive pulmonary disease) ([place]), Depression, Fibromyalgia,  Heart murmur, Hepatitis C, Hiatal hernia, Hyperlipidemia, Hypertension, IBS (irritable bowel syndrome), Insomnia, and Thyroid disease. She also  has a past surgical history that includes necksurgery (10/21/2014); Back surgery (2013); Foot surgery (Right); Elbow surgery (Left); Carpal tunnel release (Right); Nose surgery; Partial hysterectomy; Total shoulder replacement (Bilateral); and Colonoscopy with propofol (N/A, 02/05/2017). Danielle [name] has a current medication list which includes the following prescription(s): albuterol, cholecalciferol, citalopram, diclofenac sodium, docusate sodium, dulera, levothyroxine, loratadine, magnesium hydroxide, quad cane, naloxone, [START ON 07/06/2019] NONFORMULARY OR COMPOUNDED ITEM, nystatin, [START ON 07/19/2019] oxycodone hcl, [START ON 08/18/2019] oxycodone hcl, [START ON 09/17/2019] oxycodone hcl, spiriva handihaler, [START ON 07/19/2019] tizanidine, and cetirizine. She  reports that she has been smoking cigarettes. She has a 80.00 pack-year smoking history. She has never used smokeless tobacco. She reports that she does not drink alcohol or use drugs. Danielle [name] is allergic to aripiprazole; duloxetine; gabapentin; gold; iodinated diagnostic agents; naproxen sodium; nortriptyline hcl; nsaids; pregabalin; meperidine; ace inhibitors; aspirin; cefpodoxime; fluoxetine; fluticasone-salmeterol; levofloxacin; lithium; paroxetine; tape; telithromycin; theophylline; topiramate; trazodone; triamcinolone; venlafaxine; bupropion; ketorolac tromethamine; morphine; and moxifloxacin.   HPI  Today, she is being contacted for a post-procedure assessment. The patient indicates that it took about 10 days for her to fully recover from the radiofrequency ablation. Is actually good since it usually takes anywhere from 6 to 8 weeks to do so. In any case, currently she is not having any pain in the shoulder region. However, she has been having more pain in the right hip pain she would like for [place] to  schedule her to have that injected. Right now she is going through a bronchitis and she has been getting medication for that. I have instructed her that once she is completely over that, then she  can go ahead and call and schedule her right hip injection. She understood and accepted. The patient also indicated that she had an appointment for medication refill next Monday and today we have decided to go ahead and take care of that today as well. The patient indicates doing well with the current medication regimen. No adverse reactions or side effects reported to the medications.   Post-Procedure Evaluation  Procedure: Therapeutic bilateral suprascapular nerve RFA #1 under fluoroscopic guidance and IV sedation Pre-procedure pain level:  3/10 Post-procedure: 0/10 (100% relief)  Sedation: Sedation provided.  Landis Martins, RN  06/28/2019 11:23 AM  Sign when Signing Visit Pain relief after procedure (treated area only): (Questions asked to patient) 1. Starting about 15 minutes after the procedure, and "while the area was still numb" (from the local anesthetics), were you having any of your usual pain "in that area" (the treated area)?  (NOTE: NOT including the discomfort from the needle sticks.) First 1 hour: 100 % better. First 4-6 hours:100 % better. 2. How long did the numbness from the local anesthetics last? (More than 6 hours?) Duration: 24 hours hours.  3. How much better is your pain now, when compared to before the procedure? Current benefit: 90 % better. 4. Can you move better now? Improvement in ROM (Range of Motion): Yes. 5. Can you do more now? Improvement in function: Yes. 4. Did you have any problems with the procedure? Side-effects/Complications: No.  Current benefits: Defined as benefit that persist at this time.   Analgesia:  90-100% better Function: Danielle [name] reports improvement in function ROM: Danielle [name] reports improvement in ROM  Pharmacotherapy Assessment   Analgesic: Oxycodone IR 10 mg, 1 tab PO q 6 hrs (40 mg/dayof oxycodone) MME/day:60mg /day.   Monitoring: Pharmacotherapy: No side-effects or adverse reactions reported. [place] PMP: PDMP reviewed during this encounter.       Compliance: No problems identified. Effectiveness: Clinically acceptable. Plan: Refer to "POC".  UDS:  Summary  Date Value Ref Range Status  02/16/2019 Note  Final    Comment:    ==================================================================== [employer] Select 13 (MW) ==================================================================== Test                             Result       Flag       Units Drug Present and Declared for Prescription Verification   Oxycodone                      1884         EXPECTED   ng/mg creat   Oxymorphone                    39           EXPECTED   ng/mg creat   Noroxycodone                   1827         EXPECTED   ng/mg creat    Sources of oxycodone include scheduled prescription medications.    Oxymorphone and noroxycodone are expected metabolites of oxycodone.    Oxymorphone is also available as a scheduled prescription medication. ==================================================================== Test                      Result    Flag   Units      Ref Range   Creatinine  283              mg/dL      >=20 ==================================================================== Declared Medications:  The flagging and interpretation on this report are based on the  following declared medications.  Unexpected results may arise from  inaccuracies in the declared medications.  **Note: The testing scope of this panel includes these medications:  Oxycodone  **Note: The testing scope of this panel does not include the  following reported medications:  Albuterol (Ventolin HFA)  Cetirizine (Zyrtec)  Citalopram (Celexa)  Diclofenac (Voltaren)  Docusate (Colace)  Formoterol (Dulera)  Levothyroxine (Synthroid)   Loratadine (Claritin)  Magnesium (Milk of Magnesia)  Mometasone (Dulera)  Naloxone (Narcan)  Nystatin  Tiotropium (Spiriva)  Tizanidine (Zanaflex)  Vitamin D ==================================================================== For clinical consultation, please call [phone]. ====================================================================    Laboratory Chemistry Profile (12 mo)  Renal: 02/16/2019: BUN/Creatinine Ratio 13 04/28/2019: BUN 14; Creatinine, Ser 0.98  Lab Results  Component Value Date   GFRAA >60 04/28/2019   GFRNONAA >60 04/28/2019   Hepatic: 04/28/2019: Albumin 3.7 Lab Results  Component Value Date   AST 18 04/28/2019   ALT 16 04/28/2019   Other: 02/16/2019: 25-Hydroxy, Vitamin D 27; 25-Hydroxy, Vitamin D-2 2.0; 25-Hydroxy, Vitamin D-3 25; CRP 2; Sed Rate 23; Vitamin B-12 283 Note: Above Lab results reviewed.  Imaging  DG PAIN CLINIC C-ARM 1-60 MIN NO REPORT Fluoro was used, but no Radiologist interpretation will be provided.  Please refer to "NOTES" tab for provider progress note.   Assessment  The primary encounter diagnosis was Chronic pain syndrome. Diagnoses of Muscle spasm of back, Chronic musculoskeletal pain, Ulnar neuropathy (Left), Osteoarthritis involving multiple joints, and Chronic hip pain (Secondary area of Pain) (Bilateral) (R>L) were also pertinent to this visit.  Plan of Care  Problem-specific:  No problem-specific Assessment & Plan notes found for this encounter.  I have discontinued Danielle A. Leventhal "ANGIE"'s diclofenac sodium, predniSONE, and guaiFENesin-codeine. I am also having her start on Oxycodone HCl, Oxycodone HCl, and diclofenac Sodium. Additionally, I am having her maintain her magnesium hydroxide, nystatin, levothyroxine, Spiriva HandiHaler, naloxone, cetirizine, albuterol, docusate sodium, citalopram, cholecalciferol, Quad Cane, loratadine, Dulera, Oxycodone HCl, tiZANidine, and NONFORMULARY OR COMPOUNDED  ITEM.  Pharmacotherapy (Medications Ordered): Meds ordered this encounter  Medications  . Oxycodone HCl 10 MG TABS    Sig: Take 1 tablet (10 mg total) by mouth every 6 (six) hours as needed. Must last 30 days    Dispense:  120 tablet    Refill:  0    Chronic Pain: STOP Act (Not applicable) Fill 1 day early if closed on refill date. Do not fill until: 07/19/2019. To last until: 08/18/2019. Avoid benzodiazepines within 8 hours of opioids  . Oxycodone HCl 10 MG TABS    Sig: Take 1 tablet (10 mg total) by mouth every 6 (six) hours as needed. Must last 30 days    Dispense:  120 tablet    Refill:  0    Chronic Pain: STOP Act (Not applicable) Fill 1 day early if closed on refill date. Do not fill until: 08/18/2019. To last until: 09/17/2019. Avoid benzodiazepines within 8 hours of opioids  . Oxycodone HCl 10 MG TABS    Sig: Take 1 tablet (10 mg total) by mouth every 6 (six) hours as needed. Must last 30 days    Dispense:  120 tablet    Refill:  0    Chronic Pain: STOP Act (Not applicable) Fill 1 day early if closed on refill date. Do  not fill until: 09/17/2019. To last until: 10/17/2019. Avoid benzodiazepines within 8 hours of opioids  . tiZANidine (ZANAFLEX) 4 MG tablet    Sig: Take 1 tablet (4 mg total) by mouth every 8 (eight) hours as needed for muscle spasms. Max: 3/day    Dispense:  90 tablet    Refill:  5    Fill one day early if pharmacy is closed on scheduled refill date. May substitute for generic if available.  . NONFORMULARY OR COMPOUNDED ITEM    Sig: Sig: Apply 1-2 gm(s) (2-4 pumps) to affected area, 3-4 times/day. (1 pump = 0.5 gm)    Dispense:  1 each    Refill:  5    Compounded cream: 2.5% Lidocaine, 10% Ketamine, 10% Ketoprofen, 6% Gabapentin  Dispense: 120 gm Pump Bottle. (Dispenser: 1 pump = 0.5 gm.)  . diclofenac Sodium (VOLTAREN) 1 % GEL    Sig: Apply 4 g topically 4 (four) times daily.    Dispense:  350 g    Refill:  8    Fill one day early if pharmacy is closed on scheduled  refill date. May substitute for generic if available.   Orders:  Orders Placed This Encounter  Procedures  . Hip injection (PRN)    For hip pain.    Standing Status:   Standing    Number of Occurrences:   1    Standing Expiration Date:   06/27/2020    Scheduling Instructions:     Side: Right-sided     Sedation: With Sedation.     TIMEFRAME: PRN procedure. (Danielle [name] will call when needed.)   Follow-up plan:   Return in about 4 months (around 10/13/2019) for (VV), (MM), in addition, PRN Procedure(s): (R) IA Hip inj..      Considering: Possible bilateral suprascapular nerveRFA #1(Today) Possible bilateral hip jointRFA Diagnostic caudal ESI + epidurogram  Possible Racz procedure.  Possible bilateral cervical facetRFA Diagnostic right-sided CESI  Diagnostic left sacroiliac joint block Possible bilateral sacroiliac joint RFA   Palliative PRN treatment(s): Palliative bilateral trochanteric bursa #2  Palliative bilateralsuprascapular nerve block #3 Diagnostic bilateral IA hip joint injection #3 (last done 04/30/2018)  Diagnostic bilateral lumbar facet block #4 Palliative right lumbar facet RFA #3(last done 12/16/2017)  Palliative left lumbar facet RFA #3(last done 02/12/2018)  Diagnostic bilateral cervical facet block #2 Diagnostic right SI joint block #2     Recent Visits Date Type Provider Dept  05/13/19 Procedure visit Danielle Pointer, MD [facility]  04/07/19 Office Visit Danielle Pointer, MD [facility]  Showing recent visits within past 90 days and meeting all other requirements   Today's Visits Date Type Provider Dept  06/28/19 Telemedicine Danielle Pointer, MD [facility]  Showing today's visits and meeting all other requirements   Future Appointments Date Type Provider Dept  07/05/19 Appointment Danielle Pointer, MD [facility]  Showing future appointments within next 90 days and meeting all other  requirements   I discussed the assessment and treatment plan with the patient. The patient was provided an opportunity to ask questions and all were answered. The patient agreed with the plan and demonstrated an understanding of the instructions.  Patient advised to call back or seek an in-person evaluation if the symptoms or condition worsens.  Total duration of non-face-to-face encounter: 18 minutes.  Note by: Gaspar Cola, MD Date: 06/28/2019; Time: 12:32 PM  Note: This dictation was prepared with Dragon dictation. Any transcriptional errors that may result from this process are  unintentional.  Disclaimer:  * Given the special circumstances of the COVID-19 pandemic, the federal government has announced that the Office for Civil Rights (OCR) will exercise its enforcement discretion and will not impose penalties on physicians using telehealth in the event of noncompliance with regulatory requirements under the [place] (HIPAA) in connection with the good faith provision of telehealth during the XX123456 national public health emergency. ([place])

## 2019-06-28 ENCOUNTER — Ambulatory Visit: Payer: Medicaid Other | Attending: Pain Medicine | Admitting: Pain Medicine

## 2019-06-28 ENCOUNTER — Other Ambulatory Visit: Payer: Self-pay

## 2019-06-28 DIAGNOSIS — M7918 Myalgia, other site: Secondary | ICD-10-CM | POA: Diagnosis not present

## 2019-06-28 DIAGNOSIS — M62830 Muscle spasm of back: Secondary | ICD-10-CM | POA: Diagnosis not present

## 2019-06-28 DIAGNOSIS — M25551 Pain in right hip: Secondary | ICD-10-CM

## 2019-06-28 DIAGNOSIS — G5622 Lesion of ulnar nerve, left upper limb: Secondary | ICD-10-CM | POA: Diagnosis not present

## 2019-06-28 DIAGNOSIS — G894 Chronic pain syndrome: Secondary | ICD-10-CM | POA: Diagnosis not present

## 2019-06-28 DIAGNOSIS — M8949 Other hypertrophic osteoarthropathy, multiple sites: Secondary | ICD-10-CM

## 2019-06-28 DIAGNOSIS — M159 Polyosteoarthritis, unspecified: Secondary | ICD-10-CM

## 2019-06-28 DIAGNOSIS — M25552 Pain in left hip: Secondary | ICD-10-CM

## 2019-06-28 DIAGNOSIS — G8929 Other chronic pain: Secondary | ICD-10-CM

## 2019-06-28 MED ORDER — DICLOFENAC SODIUM 1 % EX GEL: 4.0000 g | Freq: Four times a day (QID) | CUTANEOUS | 8 refills | Status: DC

## 2019-06-28 MED ORDER — OXYCODONE HCL 10 MG PO TABS: 10.0000 mg | ORAL_TABLET | Freq: Four times a day (QID) | ORAL | 0 refills | Status: DC | PRN

## 2019-06-28 MED ORDER — NONFORMULARY OR COMPOUNDED ITEM: 5 refills | Status: DC

## 2019-06-28 MED ORDER — TIZANIDINE HCL 4 MG PO TABS: 4.0000 mg | ORAL_TABLET | Freq: Three times a day (TID) | ORAL | 5 refills | Status: DC | PRN

## 2019-06-28 NOTE — Progress Notes (Signed)
Pain relief after procedure (treated area only): (Questions asked to patient) 1. Starting about 15 minutes after the procedure, and "while the area was still numb" (from the local anesthetics), were you having any of your usual pain "in that area" (the treated area)?  (NOTE: NOT including the discomfort from the needle sticks.) First 1 hour: 100 % better. First 4-6 hours:100 % better. 2. How long did the numbness from the local anesthetics last? (More than 6 hours?) Duration: 24 hours hours.  3. How much better is your pain now, when compared to before the procedure? Current benefit: 90 % better. 4. Can you move better now? Improvement in ROM (Range of Motion): Yes. 5. Can you do more now? Improvement in function: Yes. 4. Did you have any problems with the procedure? Side-effects/Complications: No.

## 2019-06-29 ENCOUNTER — Telehealth: Payer: Self-pay

## 2019-06-29 NOTE — Telephone Encounter (Signed)
Called patient and she states that medicaid will not pay for the compounding creme and she could not afford  To pay out of pocket $140. Attempted to call [employer] and no answer. Called patient back to see what she wanted to do with the prescription. Put in book/locked cabinet.

## 2019-07-05 ENCOUNTER — Telehealth: Payer: Medicaid Other | Admitting: Pain Medicine

## 2019-08-03 ENCOUNTER — Ambulatory Visit: Payer: Medicaid Other | Admitting: Pain Medicine

## 2019-10-12 ENCOUNTER — Encounter: Payer: Self-pay | Admitting: Pain Medicine

## 2019-10-12 DIAGNOSIS — Z96611 Presence of right artificial shoulder joint: Secondary | ICD-10-CM | POA: Insufficient documentation

## 2019-10-12 MED ORDER — OXYCODONE HCL 10 MG PO TABS: 10.0000 mg | ORAL_TABLET | Freq: Four times a day (QID) | ORAL | 0 refills | Status: DC | PRN

## 2019-10-12 NOTE — Addendum Note (Signed)
Addended by: Milinda Pointer A on: 10/12/2019 07:40 PM   Modules accepted: Level of Service

## 2019-10-12 NOTE — Progress Notes (Addendum)
Patient: Danielle [name]  Service Category: E/M  Provider: Gaspar Cola, MD  DOB: 10-21-1960  DOS: 10/13/2019  Location: Office  MRN: [id number]  Setting: Ambulatory outpatient  Referring Provider: Marygrace Drought, MD  Type: Established Patient  Specialty: Interventional Pain Management  PCP: Danielle Drought, MD  Location: Remote location  Delivery: TeleHealth     Virtual Encounter - Pain Management PROVIDER NOTE: Information contained herein reflects review and annotations entered in association with encounter. Interpretation of such information and data should be left to medically-trained personnel. Information provided to patient can be located elsewhere in the medical record under "Patient Instructions". Document created using STT-dictation technology, any transcriptional errors that may result from process are unintentional.    Contact & Pharmacy Preferred: [phone] Home: [phone] (home) Mobile: [phone] (mobile) E-mail: [email or web address] .[place], [place] [address] [place] [address] Phone: [phone] Fax: [phone]   Pre-screening  Danielle [name] offered "in-person" vs "virtual" encounter. She indicated preferring virtual for this encounter.   Reason COVID-19*  Social distancing based on CDC and AMA recommendations.   I contacted Danielle [name] on 10/13/2019 via telephone.      I clearly identified myself as Gaspar Cola, MD. I verified that I was speaking with the correct person using two identifiers (Name: Danielle [name], and date of birth: [birth year]).  Consent I sought verbal advanced consent from Danielle [name] for virtual visit interactions. I informed Danielle [name] of possible security and privacy concerns, risks, and limitations associated with providing "not-in-person" medical evaluation and management services. I also informed Danielle [name] of the availability of "in-person" appointments.  Finally, I informed her that there would be a charge for the virtual visit and that she could be  personally, fully or partially, financially responsible for it. Danielle [name] expressed understanding and agreed to proceed.   Historic Elements   Danielle [name] is a 59 y.o. year old, female patient evaluated today after her last contact with our practice on 06/29/2019. Danielle [name]  has a past medical history of Acute postoperative pain (01/13/2017), Anxiety, Arthritis, Asthma, Bell's palsy, Bursitis, COPD (chronic obstructive pulmonary disease) ([place]), Depression, Fibromyalgia, Heart murmur, Hepatitis C, Hiatal hernia, Hyperlipidemia, Hypertension, IBS (irritable bowel syndrome), Insomnia, and Thyroid disease. She also  has a past surgical history that includes necksurgery (10/21/2014); Back surgery (2013); Foot surgery (Right); Elbow surgery (Left); Carpal tunnel release (Right); Nose surgery; Partial hysterectomy; Total shoulder replacement (Bilateral); and Colonoscopy with propofol (N/A, 02/05/2017). Danielle [name] has a current medication list which includes the following prescription(s): albuterol, cholecalciferol, citalopram, diclofenac sodium, docusate sodium, dulera, levothyroxine, loratadine, magnesium hydroxide, quad cane, naloxone, nystatin, [START ON 10/17/2019] oxycodone hcl, [START ON 11/16/2019] oxycodone hcl, [START ON 12/16/2019] oxycodone hcl, spiriva handihaler, tizanidine, varenicline, and cetirizine. She  reports that she has been smoking cigarettes. She has a 80.00 pack-year smoking history. She has never used smokeless tobacco. She reports that she does not drink alcohol or use drugs. Danielle [name] is allergic to aripiprazole; duloxetine; gabapentin; gold; iodinated diagnostic agents; naproxen sodium; nortriptyline hcl; nsaids; pregabalin; meperidine; ace inhibitors; aspirin; cefpodoxime; fluoxetine; fluticasone-salmeterol; levofloxacin; lithium; paroxetine; tape; telithromycin; theophylline;  topiramate; trazodone; triamcinolone; venlafaxine; bupropion; ketorolac tromethamine; morphine; and moxifloxacin.   HPI  Today, she is being contacted for medication management. The patient indicates doing well with the current medication regimen. No adverse reactions or side effects reported to the medications.  The patient indicates having a flareup  of her left shoulder pain as well as her hip pain.  She is interested in perhaps having the shoulder injected again.  She has a history of bilateral total shoulder replacement.  Therefore, we will we plan on doing for her would be a left suprascapular nerve block.  If this is effective in controlling her pain, we may consider a left suprascapular nerve RFA.  Her last bilateral suprascapular nerve RFA was done on 05/13/2019.  Pharmacotherapy Assessment  Analgesic: Oxycodone IR 10 mg, 1 tab PO q 6 hrs (40 mg/dayof oxycodone) MME/day:83m/day.   Monitoring: [place] PMP: PDMP reviewed during this encounter.       Pharmacotherapy: No side-effects or adverse reactions reported. Compliance: No problems identified. Effectiveness: Clinically acceptable. Plan: Refer to "POC".  UDS:  Summary  Date Value Ref Range Status  02/16/2019 Note  Final    Comment:    ==================================================================== [employer] Select 13 (MW) ==================================================================== Test                             Result       Flag       Units Drug Present and Declared for Prescription Verification   Oxycodone                      1884         EXPECTED   ng/mg creat   Oxymorphone                    39           EXPECTED   ng/mg creat   Noroxycodone                   1827         EXPECTED   ng/mg creat    Sources of oxycodone include scheduled prescription medications.    Oxymorphone and noroxycodone are expected metabolites of oxycodone.    Oxymorphone is also available as a scheduled prescription  medication. ==================================================================== Test                      Result    Flag   Units      Ref Range   Creatinine              283              mg/dL      >=20 ==================================================================== Declared Medications:  The flagging and interpretation on this report are based on the  following declared medications.  Unexpected results may arise from  inaccuracies in the declared medications.  **Note: The testing scope of this panel includes these medications:  Oxycodone  **Note: The testing scope of this panel does not include the  following reported medications:  Albuterol (Ventolin HFA)  Cetirizine (Zyrtec)  Citalopram (Celexa)  Diclofenac (Voltaren)  Docusate (Colace)  Formoterol (Dulera)  Levothyroxine (Synthroid)  Loratadine (Claritin)  Magnesium (Milk of Magnesia)  Mometasone (Dulera)  Naloxone (Narcan)  Nystatin  Tiotropium (Spiriva)  Tizanidine (Zanaflex)  Vitamin D ==================================================================== For clinical consultation, please call ([phone] ====================================================================    Laboratory Chemistry Profile   Renal Lab Results  Component Value Date   BUN 14 04/28/2019   CREATININE 0.98 04/28/2019   BCR 13 02/16/2019   GFRAA >60 04/28/2019   GFRNONAA >60 04/28/2019     Hepatic Lab Results  Component Value Date   AST 18 04/28/2019  ALT 16 04/28/2019   ALBUMIN 3.7 04/28/2019   ALKPHOS 80 04/28/2019     Electrolytes Lab Results  Component Value Date   NA 139 04/28/2019   K 4.1 04/28/2019   CL 105 04/28/2019   CALCIUM 9.3 04/28/2019   MG 2.1 02/16/2019     Bone Lab Results  Component Value Date   25OHVITD1 27 (L) 02/16/2019   25OHVITD2 2.0 02/16/2019   25OHVITD3 25 02/16/2019     Inflammation (CRP: Acute Phase) (ESR: Chronic Phase) Lab Results  Component Value Date   CRP 2 02/16/2019    ESRSEDRATE 23 02/16/2019       Note: Above Lab results reviewed.  Imaging  DG PAIN CLINIC C-ARM 1-60 MIN NO REPORT Fluoro was used, but no Radiologist interpretation will be provided.  Please refer to "NOTES" tab for provider progress note.  Assessment  The primary encounter diagnosis was Chronic pain syndrome. Diagnoses of Chronic shoulder pain (Primary Area of Pain) (Bilateral) (R>L), Chronic shoulder pain after replacement, History of total shoulder replacement (Left), Chronic hip pain (Secondary area of Pain) (Bilateral) (R>L), and Chronic low back pain (Third area of Pain) (Bilateral) (R>L) were also pertinent to this visit.  Plan of Care  Problem-specific:  No problem-specific Assessment & Plan notes found for this encounter.  Ms. SHAMERA YARBERRY has a current medication list which includes the following long-term medication(s): albuterol, citalopram, diclofenac sodium, dulera, levothyroxine, [START ON 10/17/2019] oxycodone hcl, [START ON 11/16/2019] oxycodone hcl, [START ON 12/16/2019] oxycodone hcl, spiriva handihaler, tizanidine, and cetirizine.  Pharmacotherapy (Medications Ordered): Meds ordered this encounter  Medications  . Oxycodone HCl 10 MG TABS    Sig: Take 1 tablet (10 mg total) by mouth every 6 (six) hours as needed. Must last 30 days    Dispense:  120 tablet    Refill:  0    Chronic Pain: STOP Act (Not applicable) Fill 1 day early if closed on refill date. Do not fill until: 10/17/2019. To last until: 11/16/2019. Avoid benzodiazepines within 8 hours of opioids  . Oxycodone HCl 10 MG TABS    Sig: Take 1 tablet (10 mg total) by mouth every 6 (six) hours as needed. Must last 30 days    Dispense:  120 tablet    Refill:  0    Chronic Pain: STOP Act (Not applicable) Fill 1 day early if closed on refill date. Do not fill until: 11/16/2019. To last until: 12/16/2019. Avoid benzodiazepines within 8 hours of opioids  . Oxycodone HCl 10 MG TABS    Sig: Take 1 tablet (10 mg total) by  mouth every 6 (six) hours as needed. Must last 30 days    Dispense:  120 tablet    Refill:  0    Chronic Pain: STOP Act (Not applicable) Fill 1 day early if closed on refill date. Do not fill until: 12/16/2019. To last until: 01/15/2020. Avoid benzodiazepines within 8 hours of opioids   Orders:  Orders Placed This Encounter  Procedures  . SUPRASCAPULAR NERVE BLOCK    CLINICAL INDICATIONS: For shoulder pain.    Standing Status:   Standing    Number of Occurrences:   1    Standing Expiration Date:   10/11/2020    Scheduling Instructions:     Purpose: Palliative     Laterality: Left-sided     Level(s): Suprascapular notch     Sedation: With Sedation.     TIMEFRAME: PRN procedure. (Ms. Hagg will call when needed.)    Order  Specific Question:   Where will this procedure be performed?    Answer:   [facility]   Follow-up plan:   Return in about 3 months (around 01/10/2020) for (VV), (MM), in addition, PRN Procedure(s): (L) SSNB, (w/ Sedation).      Considering:  Possible bilateral hip jointRFA Diagnostic caudal ESI + epidurogram  Possible Racz procedure.  Possible bilateral cervical facetRFA Diagnostic right-sided CESI  Diagnostic left sacroiliac joint block Possible bilateral sacroiliac joint RFA   Palliative PRN treatment(s): Palliative bilateral trochanteric bursa #2  Palliative bilateralsuprascapular nerve block #3 Palliative bilateral suprascapular nerveRFA #2(last done on 05/13/2019) Diagnostic bilateral IA hip joint injection #3 (last done 04/30/2018)  Diagnostic bilateral lumbar facet block #4 Palliative right lumbar facet RFA #3(last done 12/16/2017)  Palliative left lumbar facet RFA #3(last done 02/12/2018)  Diagnostic bilateral cervical facet block #2 Diagnostic right SI joint block #2    Recent Visits No visits were found meeting these conditions.  Showing recent visits within past 90 days and meeting all other requirements   Today's  Visits Date Type Provider Dept  10/13/19 Telemedicine Milinda Pointer, MD [facility]  Showing today's visits and meeting all other requirements   Future Appointments Date Type Provider Dept  01/10/20 Appointment Milinda Pointer, MD [facility]  Showing future appointments within next 90 days and meeting all other requirements   I discussed the assessment and treatment plan with the patient. The patient was provided an opportunity to ask questions and all were answered. The patient agreed with the plan and demonstrated an understanding of the instructions.  Patient advised to call back or seek an in-person evaluation if the symptoms or condition worsens.  Duration of encounter: 13 minutes.  Note by: Gaspar Cola, MD Date: 10/13/2019; Time: 8:34 AM

## 2019-10-13 ENCOUNTER — Other Ambulatory Visit: Payer: Self-pay

## 2019-10-13 ENCOUNTER — Ambulatory Visit: Payer: Medicaid Other | Attending: Pain Medicine | Admitting: Pain Medicine

## 2019-10-13 DIAGNOSIS — M25511 Pain in right shoulder: Secondary | ICD-10-CM | POA: Diagnosis not present

## 2019-10-13 DIAGNOSIS — Z96619 Presence of unspecified artificial shoulder joint: Secondary | ICD-10-CM

## 2019-10-13 DIAGNOSIS — G8929 Other chronic pain: Secondary | ICD-10-CM

## 2019-10-13 DIAGNOSIS — Z96612 Presence of left artificial shoulder joint: Secondary | ICD-10-CM | POA: Diagnosis not present

## 2019-10-13 DIAGNOSIS — G894 Chronic pain syndrome: Secondary | ICD-10-CM | POA: Diagnosis not present

## 2019-10-13 DIAGNOSIS — M25512 Pain in left shoulder: Secondary | ICD-10-CM

## 2019-10-13 DIAGNOSIS — M25552 Pain in left hip: Secondary | ICD-10-CM

## 2019-10-13 DIAGNOSIS — M25519 Pain in unspecified shoulder: Secondary | ICD-10-CM

## 2019-10-13 DIAGNOSIS — M25551 Pain in right hip: Secondary | ICD-10-CM

## 2019-10-13 DIAGNOSIS — M5441 Lumbago with sciatica, right side: Secondary | ICD-10-CM

## 2019-12-01 DIAGNOSIS — R0982 Postnasal drip: Secondary | ICD-10-CM | POA: Insufficient documentation

## 2019-12-08 ENCOUNTER — Ambulatory Visit (INDEPENDENT_AMBULATORY_CARE_PROVIDER_SITE_OTHER): Payer: Medicaid Other | Admitting: Pulmonary Disease

## 2019-12-08 ENCOUNTER — Encounter: Payer: Self-pay | Admitting: Pulmonary Disease

## 2019-12-08 ENCOUNTER — Other Ambulatory Visit: Payer: Self-pay

## 2019-12-08 VITALS — BP 140/86 | HR 81 | Temp 97.5°F | Ht 65.0 in | Wt 260.0 lb

## 2019-12-08 DIAGNOSIS — F17200 Nicotine dependence, unspecified, uncomplicated: Secondary | ICD-10-CM | POA: Diagnosis not present

## 2019-12-08 DIAGNOSIS — J449 Chronic obstructive pulmonary disease, unspecified: Secondary | ICD-10-CM

## 2019-12-08 DIAGNOSIS — R05 Cough: Secondary | ICD-10-CM | POA: Diagnosis not present

## 2019-12-08 DIAGNOSIS — Z6841 Body Mass Index (BMI) 40.0 and over, adult: Secondary | ICD-10-CM

## 2019-12-08 DIAGNOSIS — R059 Cough, unspecified: Secondary | ICD-10-CM

## 2019-12-08 MED ORDER — TRELEGY ELLIPTA 100-62.5-25 MCG/INH IN AEPB: 1.0000 | INHALATION_SPRAY | Freq: Every day | RESPIRATORY_TRACT | 11 refills | Status: DC

## 2019-12-08 MED ORDER — TRELEGY ELLIPTA 100-62.5-25 MCG/INH IN AEPB: 1.0000 | INHALATION_SPRAY | Freq: Every day | RESPIRATORY_TRACT | 0 refills | Status: DC

## 2019-12-08 NOTE — Progress Notes (Signed)
Subjective:    Patient ID: Danielle [name], female    DOB: Jan 15, 1961, 59 y.o.   MRN: [id number]  HPI Danielle [name] is a 59 year old current smoker (0.5 PPD, 2 PPD previously) who presents for evaluation of recurrent bouts with bronchitis.  The patient states that she has had recurrent bouts of bronchitis and productive cough at least 4 to 5/year since around 2018.  She notes that prednisone, Mucinex allergy medications and inhalers make her better.  She is worsened on her symptoms by going outside, upper respiratory infections or cold weather.  She has carried a diagnosis of asthma and COPD however there is no FEV1 quantitation.  She has recurrent issues with sinus infections and has had prior nasal fractures.  She is currently not being followed by ENT.  She notes that she has chronic rhinorrhea at times purulent.  Her cough is productive of yellowish to occasionally greenish sputum, no hemoptysis. She does not report currently fevers, chills or sweats.  She is currently on prednisone to treat a skin rash.  She does not endorse any weight loss nor anorexia.  She does state that she has orthopnea, a rare episode of paroxysmal nocturnal dyspnea that occurred remotely and occasional edema of the lower extremities mostly her left leg.  She has issues with chronic joint pain.  She does state that she is on Spiriva and uses as needed albuterol several times a week.  She has been prescribed Dulera but cannot use it due to recurrent thrush.  She has had similar experience with other LABA/ICS combinations.  She has not been rinsing her mouth well after use of these inhalers.   She has been smoking since age 85 up to 2 packs of cigarettes per day.  Recently decreased to half a pack of cigarettes per day (over the last 2 weeks).  Engaged in smoking cessation with Chantix.  Review of Systems A 10 point review of systems was performed and it is as noted above otherwise negative.  Past Medical History:    Diagnosis Date  . Acute postoperative pain 01/13/2017  . Anxiety   . Arthritis   . Asthma   . Bell's palsy   . Bursitis   . COPD (chronic obstructive pulmonary disease) ([place])   . Depression   . Fibromyalgia   . Heart murmur   . Hepatitis C   . Hiatal hernia   . Hyperlipidemia   . Hypertension   . IBS (irritable bowel syndrome)   . Insomnia   . Thyroid disease    Past Surgical History:  Procedure Laterality Date  . BACK SURGERY  2013   lumbar disk  . CARPAL TUNNEL RELEASE Right   . COLONOSCOPY WITH PROPOFOL N/A 02/05/2017   Procedure: COLONOSCOPY WITH PROPOFOL;  Surgeon: Robert Bellow, MD;  Location: [facility];  Service: Endoscopy;  Laterality: N/A;  . ELBOW SURGERY Left   . FOOT SURGERY Right   . necksurgery  10/21/2014   spine neck fusion  . NOSE SURGERY    . PARTIAL HYSTERECTOMY    . TOTAL SHOULDER REPLACEMENT Bilateral    Family History  Problem Relation Age of Onset  . Gout Mother   . Cancer Mother   . Heart disease Father    Allergies  Allergen Reactions  . Aripiprazole Swelling  . Duloxetine Swelling    Other reaction(s): Other (See Comments) Clogged her up - constipation, feet swelling.  . Gabapentin Swelling    Feet swell.  Girtha Rm Hives and  Itching    Itching 45 minutes after taking it & then broke out in hives. Was put on Prednisone. Treated for 5 months.  . Iodinated Diagnostic Agents Anaphylaxis    Anaphylactic reaction first encounter with myelogram. Had myelogram, given IV dye, had no heart beat, no pulse.  . Naproxen Sodium     Other reaction(s): Other (See Comments) GI bleeding.  . Nortriptyline Hcl Swelling    Other reaction(s): Other (See Comments) Made her hoarse, throat swelling when she took 100 mg.  . Nsaids     Other reaction(s): Other (See Comments) GI bleed.  . Pregabalin Swelling    Feet swelling.  . Meperidine Hives    Meperidine is the generic name for Demerol  . Ace Inhibitors     Other reaction(s): Other (See  Comments) Unknown  . Aspirin     Other reaction(s): Other (See Comments) Bothers her stomach.  . Cefpodoxime     Other reaction(s): Other (See Comments) Bad thrush.  . Fluoxetine Diarrhea    Constant runs.  . Fluticasone-Salmeterol Other (See Comments)    Blisters in mouth Other reaction(s): Other (See Comments) Blisters in mouth  . Levofloxacin     Other reaction(s): Other (See Comments) Unknown, probably stomach issue.  . Lithium     Other reaction(s): Other (See Comments) Made her stare at the walls & cry.  . Paroxetine     Other reaction(s): Other (See Comments) Unknown  . Tape     Other reaction(s): Other (See Comments) Skin peels off  . Telithromycin     Other reaction(s): Other (See Comments) Blisters in mouth plus thrush.  . Theophylline     Other reaction(s): Other (See Comments) Unknown  . Topiramate Other (See Comments)    Vomiting and increased depression Other reaction(s): Other (See Comments) Vomiting and increased depression  . Trazodone     Other reaction(s): Other (See Comments) Blisters lips very bad.  . Triamcinolone     Other reaction(s): Other (See Comments) Mouth blistered down her throat even if she rinsed her mouth.  . Venlafaxine Diarrhea    Took for years & did fine. Dropped electrolytes because it caused a lot of diarrhea.  . Bupropion Rash  . Ketorolac Tromethamine Nausea And Vomiting    Other reaction(s): Other (See Comments) Puking & headache  . Morphine Nausea And Vomiting    Intermittent nausea & vomiting with pills, but could tolerate the liquid form.  . Moxifloxacin Nausea And Vomiting    Other reaction(s): Other (See Comments) Bad thrush.   Current Meds  Medication Sig  . albuterol (PROVENTIL HFA;VENTOLIN HFA) 108 (90 Base) MCG/ACT inhaler Inhale 2 puffs into the lungs every 4 (four) hours as needed for wheezing or shortness of breath.   . cholecalciferol (VITAMIN D) 1000 units tablet Take 5,000 Units by mouth daily.   .  citalopram (CELEXA) 20 MG tablet Take 20 mg by mouth daily.  . diclofenac Sodium (VOLTAREN) 1 % GEL Apply 4 g topically 4 (four) times daily.  Marland Kitchen docusate sodium (COLACE) 100 MG capsule Take 100 mg by mouth daily.   . fexofenadine-pseudoephedrine (ALLEGRA-D 24) 180-240 MG 24 hr tablet Take 1 tablet by mouth daily.  Marland Kitchen guaiFENesin (MUCINEX) 600 MG 12 hr tablet Take 1,200 mg by mouth 2 (two) times daily.  Marland Kitchen levothyroxine (SYNTHROID, LEVOTHROID) 50 MCG tablet TAKE 1 TABLET (50 MCG TOTAL) BY MOUTH DAILY AT 0600.  . magnesium hydroxide (MILK OF MAGNESIA) 400 MG/5ML suspension Take by mouth.  . naloxone (  NARCAN) 2 MG/2ML injection Inject content of syringe into thigh muscle. Call 911.  Derrill Memo ON 12/16/2019] Oxycodone HCl 10 MG TABS Take 1 tablet (10 mg total) by mouth every 6 (six) hours as needed. Must last 30 days  . tiZANidine (ZANAFLEX) 4 MG tablet Take 1 tablet (4 mg total) by mouth every 8 (eight) hours as needed for muscle spasms. Max: 3/day  . varenicline (CHANTIX PAK) 0.5 MG X 11 & 1 MG X 42 tablet Take by mouth 2 (two) times daily. Take one 0.5 mg tablet by mouth once daily for 3 days, then increase to one 0.5 mg tablet twice daily for 4 days, then increase to one 1 mg tablet twice daily.  . [DISCONTINUED] SPIRIVA HANDIHALER 18 MCG inhalation capsule PLACE 1 CAPSULE (18 MCG TOTAL) INTO INHALER AND INHALE ONCE DAILY.   Social History   Tobacco Use  . Smoking status: Current Every Day Smoker    Packs/day: 2.00    Years: 40.00    Pack years: 80.00    Types: Cigarettes  . Smokeless tobacco: Never Used  . Tobacco comment: 1/2 ppd 12/08/19//lmr  Substance Use Topics  . Alcohol use: No    Alcohol/week: 0.0 standard drinks    Comment: rarely   Has used cocaine in the past but no recent use.  Alcohol in the past, no recent use.     Objective:   Physical Exam BP 140/86 (BP Location: Left Arm, Cuff Size: Normal)   Pulse 81   Temp (!) 97.5 F (36.4 C) (Temporal)   Ht 5\' 5"  (1.651 m)   Wt  260 lb (117.9 kg)   SpO2 98%   BMI 43.27 kg/m   GENERAL: Obese woman (BMI 43), in no acute distress.  Fully ambulatory. HEAD: Normocephalic, atraumatic.  EYES: Pupils equal, round, reactive to light.  No scleral icterus.  MOUTH: Nose/mouth/throat not examined due to masking requirements for COVID 19. NECK: Supple. No thyromegaly. Trachea midline. No JVD.  No adenopathy. PULMONARY: Coarse breath sounds bilaterally, no other adventitious sounds CARDIOVASCULAR: S1 and S2. Regular rate and rhythm.  No murmurs, rubs or gallops heard. GASTROINTESTINAL: Obese, nondistended. MUSCULOSKELETAL: No joint deformity, no clubbing, no edema.  NEUROLOGIC: No focal deficits evident, fluent speech. SKIN: Intact,warm,dry. PSYCH: Mood and behavior appropriate.  Most recent chest x-ray available 04/2019, independently reviewed.       Assessment & Plan:     ICD-10-CM   1. Cough  R05 Pulmonary Function Test [facility] Only   Likely due to recurrent bronchitis Aggravated by tobacco use Obtain PFTs Abstain from tobacco use  2. COPD suggested by initial evaluation [facility])  J44.9    Suspect chronic bronchitis May have asthmatic bronchitis component PFTs will be obtained Trial of Trelegy Ellipta  3. Heavy smoker  F17.200 Ambulatory Referral for Lung Cancer Scre   Referred for low-dose chest CT cancer screening  4. Morbid obesity with BMI of 40.0-44.9, adult ([place])  E66.01    Z68.41    Weight loss recommended   Meds ordered this encounter  Medications  . Fluticasone-Umeclidin-Vilant (TRELEGY ELLIPTA) 100-62.5-25 MCG/INH AEPB    Sig: Inhale 1 puff into the lungs daily.    Dispense:  14 each    Refill:  0    Order Specific Question:   Lot Number?    Answer:   [id number]    Order Specific Question:   Expiration Date?    Answer:   03/15/2021    Order Specific Question:   Quantity  Answer:   1    Discussion:  Suspect that the patient has been poorly compensated with regards to COPD on the basis of chronic  bronchitis.  She has issues with recurrent thrush with LABA/ICS combination.  We will give her a trial of Trelegy Ellipta, it does have ICS however, it is only dosed once daily and may help reduce thrush likelihood.  Recommended rinsing well after use with small amount of baking soda and water.  She is to continue as needed albuterol.  She has been advised that smoking cessation is key.  We will obtain pulmonary function testing to determine severity of her disease.  She will also be enrolled in lung cancer screening program.  We will see her in follow-up in 6 to 8 weeks time she is to contact us prior to that time should any new difficulties arise.  Renold Don, MD [facility]   *This note was dictated using voice recognition software/Dragon.  Despite best efforts to proofread, errors can occur which can change the meaning.  Any change was purely unintentional.

## 2019-12-08 NOTE — Patient Instructions (Signed)
We are going to switch her inhalers to Trelegy Ellipta 1 inhalation daily   Stop taking Spiriva and Dulera   Rinse your mouth well after the Trelegy with a small amount of baking soda in the water this will hopefully prevent thrush issues   Continue your efforts to stop smoking   Going to schedule you for breathing tests she also   Due to the lung cancer screening this will have to be done in [place] due to the grant able to be used with Medicaid   I recommend that you have a full allergy evaluation however, you will need to do this when you are off of prednisone as this can blunt the evaluation   We will see you in follow-up in 2 8 weeks time call sooner should any new problems arise

## 2019-12-14 ENCOUNTER — Telehealth: Payer: Self-pay | Admitting: Pulmonary Disease

## 2019-12-14 NOTE — Telephone Encounter (Signed)
PA request was received from (pharmacy): [facility] [id number] Medication name and strength: Trelegy Ellipta 100-62.5-25 MCG/INH Aerosol powder  Ordering Provider: CG  Was PA started with CMM?: yes If yes, please enter KEY: [id number] Medication tried and failed: Dulera Covered Alternatives: Advair, Symbicort and Dulera  PA sent to plan, time frame for approval / denial: 1 wk Routing to [employer] for follow-up

## 2019-12-21 NOTE — Telephone Encounter (Signed)
Per CMM, still awaiting determination.

## 2019-12-22 NOTE — Telephone Encounter (Signed)
PA for Trelegy has been denied.  Covered alternatives are Advair, symbicort and dulera.  Dr. Patsey Berthold please advise. Thanks

## 2019-12-27 NOTE — Telephone Encounter (Signed)
She needs ICS/LABA/LAMA combination she could be placed on Advair 100/50 inhalation twice a day AND Spiriva OR Incruse added.

## 2019-12-27 NOTE — Telephone Encounter (Signed)
Lm for pt.  Per Dr. Patsey Berthold verbally spiriva handihaler.

## 2019-12-28 ENCOUNTER — Telehealth: Payer: Self-pay | Admitting: *Deleted

## 2019-12-28 MED ORDER — FLUTICASONE-SALMETEROL 100-50 MCG/DOSE IN AEPB: 1.0000 | INHALATION_SPRAY | Freq: Two times a day (BID) | RESPIRATORY_TRACT | 5 refills | Status: DC

## 2019-12-28 NOTE — Telephone Encounter (Addendum)
Spoke to pt and relayed below recommendations.  Pt stated that she already has a Rx for Spiriva that she started back taking, once she was aware that Trelegy was denied.  Rx for advair 100 has been sent to preferred pharmacy. Nothing further is needed.

## 2019-12-28 NOTE — Addendum Note (Signed)
Addended by: Claudette Head A on: 12/28/2019 08:18 AM   Modules accepted: Orders

## 2019-12-28 NOTE — Telephone Encounter (Signed)
Received referral for low dose lung cancer screening CT scan. Message left at phone number listed in EMR for patient to call me back to facilitate scheduling scan.  

## 2019-12-31 DIAGNOSIS — J45909 Unspecified asthma, uncomplicated: Secondary | ICD-10-CM | POA: Insufficient documentation

## 2019-12-31 DIAGNOSIS — R600 Localized edema: Secondary | ICD-10-CM | POA: Insufficient documentation

## 2020-01-03 ENCOUNTER — Telehealth: Payer: Self-pay

## 2020-01-03 NOTE — Telephone Encounter (Signed)
Pt is aware of date/time covid test and voiced her understanding.  Nothing further is needed.

## 2020-01-05 ENCOUNTER — Other Ambulatory Visit
Admission: RE | Admit: 2020-01-05 | Discharge: 2020-01-05 | Disposition: A | Discharge status: Home / Self Care | Payer: Medicaid Other | Source: Ambulatory Visit | Attending: Pulmonary Disease | Admitting: Pulmonary Disease

## 2020-01-05 ENCOUNTER — Other Ambulatory Visit: Payer: Self-pay

## 2020-01-05 ENCOUNTER — Encounter: Payer: Self-pay | Admitting: Pain Medicine

## 2020-01-05 DIAGNOSIS — Z20822 Contact with and (suspected) exposure to covid-19: Secondary | ICD-10-CM | POA: Insufficient documentation

## 2020-01-05 DIAGNOSIS — Z01812 Encounter for preprocedural laboratory examination: Secondary | ICD-10-CM | POA: Diagnosis present

## 2020-01-05 LAB — SARS CORONAVIRUS 2 (TAT 6-24 HRS): SARS Coronavirus 2: NEGATIVE

## 2020-01-06 ENCOUNTER — Ambulatory Visit: Payer: Medicaid Other | Attending: Pulmonary Disease

## 2020-01-06 ENCOUNTER — Ambulatory Visit: Payer: Medicaid Other

## 2020-01-06 DIAGNOSIS — R05 Cough: Secondary | ICD-10-CM | POA: Diagnosis present

## 2020-01-06 DIAGNOSIS — R059 Cough, unspecified: Secondary | ICD-10-CM

## 2020-01-09 NOTE — Progress Notes (Signed)
Patient: Danielle [name]  Service Category: E/M  Provider: Gaspar Cola, MD  DOB: 1961-06-28  DOS: 01/10/2020  Location: Office  MRN: [id number]  Setting: Ambulatory outpatient  Referring Provider: Marygrace Drought, MD  Type: Established Patient  Specialty: Interventional Pain Management  PCP: [facility]  Location: Remote location  Delivery: TeleHealth     Virtual Encounter - Pain Management PROVIDER NOTE: Information contained herein reflects review and annotations entered in association with encounter. Interpretation of such information and data should be left to medically-trained personnel. Information provided to patient can be located elsewhere in the medical record under "Patient Instructions". Document created using STT-dictation technology, any transcriptional errors that may result from process are unintentional.    Contact & Pharmacy Preferred: [phone] Home: [phone] (home) Mobile: [phone] (mobile) E-mail: [email or web address] .[email or web address], [place] [address] [place] [address] Phone: [phone] Fax: [phone]   Pre-screening  Ms. Lingo offered "in-person" vs "virtual" encounter. She indicated preferring virtual for this encounter.   Reason COVID-19*  Social distancing based on CDC and AMA recommendations.   I contacted Danielle [name] on 01/10/2020 via telephone.      I clearly identified myself as Gaspar Cola, MD. I verified that I was speaking with the correct person using two identifiers (Name: HAROLYN COCKER, and date of birth: 1961-04-20).  Consent I sought verbal advanced consent from Danielle [name] for virtual visit interactions. I informed Ms. Reid of possible security and privacy concerns, risks, and limitations associated with providing "not-in-person" medical evaluation and management services. I also informed Ms. Boling of the availability of "in-person" appointments.  Finally, I informed her that there would be a charge for the virtual visit and that she could be  personally, fully or partially, financially responsible for it. Ms. Booze expressed understanding and agreed to proceed.   Historic Elements   Ms. GABRIELE ZWILLING is a 59 y.o. year old, female patient evaluated today after her last contact with our practice on Visit date not found. Ms. Lacey  has a past medical history of Acute postoperative pain (01/13/2017), Anxiety, Arthritis, Asthma, Bell's palsy, Bursitis, COPD (chronic obstructive pulmonary disease) ([place]), Depression, Fibromyalgia, Heart murmur, Hepatitis C, Hiatal hernia, Hyperlipidemia, Hypertension, IBS (irritable bowel syndrome), Insomnia, and Thyroid disease. She also  has a past surgical history that includes necksurgery (10/21/2014); Back surgery (2013); Foot surgery (Right); Elbow surgery (Left); Carpal tunnel release (Right); Nose surgery; Partial hysterectomy; Total shoulder replacement (Bilateral); and Colonoscopy with propofol (N/A, 02/05/2017). Ms. Simmonds has a current medication list which includes the following prescription(s): albuterol, cholecalciferol, citalopram, [START ON 01/11/2020] diclofenac sodium, docusate sodium, fexofenadine-pseudoephedrine, fluticasone-salmeterol, guaifenesin, levothyroxine, magnesium hydroxide, naloxone, nystatin, [START ON 01/15/2020] oxycodone hcl, tiotropium, [START ON 01/15/2020] tizanidine, and varenicline. She  reports that she has been smoking cigarettes. She has a 80.00 pack-year smoking history. She has never used smokeless tobacco. She reports that she does not drink alcohol and does not use drugs. Ms. Crites is allergic to aripiprazole, duloxetine, gabapentin, gold, iodinated diagnostic agents, naproxen sodium, nortriptyline hcl, nsaids, pregabalin, meperidine, ace inhibitors, aspirin, cefpodoxime, fluoxetine, fluticasone-salmeterol, levofloxacin, lithium, paroxetine, tape, telithromycin, theophylline,  topiramate, trazodone, triamcinolone, venlafaxine, bupropion, ketorolac tromethamine, morphine, and moxifloxacin.   HPI  Today, she is being contacted for medication management. The patient indicates doing well with the current medication regimen. No adverse reactions or side effects reported to the medications.  The patient indicates having an increase in her  pain in both hips.  Specifically she points that the area of the trochanteric bursa, bilaterally.  She would like to come in for therapeutic/palliative injections.  Pharmacotherapy Assessment  Analgesic: Oxycodone IR 10 mg, 1 tab PO q 6 hrs (40 mg/dayof oxycodone) MME/day:62m/day.   Monitoring: [place] PMP: PDMP reviewed during this encounter.       Pharmacotherapy: No side-effects or adverse reactions reported. Compliance: No problems identified. Effectiveness: Clinically acceptable. Plan: Refer to "POC".  UDS:  Summary  Date Value Ref Range Status  02/16/2019 Note  Final    Comment:    ==================================================================== [employer] Select 13 (MW) ==================================================================== Test                             Result       Flag       Units Drug Present and Declared for Prescription Verification   Oxycodone                      1884         EXPECTED   ng/mg creat   Oxymorphone                    39           EXPECTED   ng/mg creat   Noroxycodone                   1827         EXPECTED   ng/mg creat    Sources of oxycodone include scheduled prescription medications.    Oxymorphone and noroxycodone are expected metabolites of oxycodone.    Oxymorphone is also available as a scheduled prescription medication. ==================================================================== Test                      Result    Flag   Units      Ref Range   Creatinine              283              mg/dL       >=20 ==================================================================== Declared Medications:  The flagging and interpretation on this report are based on the  following declared medications.  Unexpected results may arise from  inaccuracies in the declared medications.  **Note: The testing scope of this panel includes these medications:  Oxycodone  **Note: The testing scope of this panel does not include the  following reported medications:  Albuterol (Ventolin HFA)  Cetirizine (Zyrtec)  Citalopram (Celexa)  Diclofenac (Voltaren)  Docusate (Colace)  Formoterol (Dulera)  Levothyroxine (Synthroid)  Loratadine (Claritin)  Magnesium (Milk of Magnesia)  Mometasone (Dulera)  Naloxone (Narcan)  Nystatin  Tiotropium (Spiriva)  Tizanidine (Zanaflex)  Vitamin D ==================================================================== For clinical consultation, please call [phone] ====================================================================     Laboratory Chemistry Profile   Renal Lab Results  Component Value Date   BUN 14 04/28/2019   CREATININE 0.98 04/28/2019   BCR 13 02/16/2019   GFRAA >60 04/28/2019   GFRNONAA >60 04/28/2019     Hepatic Lab Results  Component Value Date   AST 18 04/28/2019   ALT 16 04/28/2019   ALBUMIN 3.7 04/28/2019   ALKPHOS 80 04/28/2019     Electrolytes Lab Results  Component Value Date   NA 139 04/28/2019   K 4.1 04/28/2019   CL 105 04/28/2019   CALCIUM 9.3 04/28/2019   MG 2.1  02/16/2019     Bone Lab Results  Component Value Date   25OHVITD1 27 (L) 02/16/2019   25OHVITD2 2.0 02/16/2019   25OHVITD3 25 02/16/2019     Inflammation (CRP: Acute Phase) (ESR: Chronic Phase) Lab Results  Component Value Date   CRP 2 02/16/2019   ESRSEDRATE 23 02/16/2019       Note: Above Lab results reviewed.   Imaging  Pulmonary Function Test [facility] Only Spirometry Data Is Acceptable and Reproducible  Severe Obstructive Airways  Disease without  Significant BD response +hyperinflation +airtrapping consider also underlying ILD  Consider Outpatient Pulmonary Consultation if needed Clinical Correlation Advised  Assessment  The primary encounter diagnosis was Chronic pain syndrome. 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  Plan of Care  Problem-specific:  No problem-specific Assessment & Plan notes found for this encounter.  Ms. ROSLYNN HOLTE has a current medication list which includes the following long-term medication(s): albuterol, citalopram, [START ON 01/11/2020] diclofenac sodium, fluticasone-salmeterol, levothyroxine, [START ON 01/15/2020] oxycodone hcl, and [START ON 01/15/2020] tizanidine.  Pharmacotherapy (Medications Ordered): Meds ordered this encounter  Medications  . Oxycodone HCl 10 MG TABS    Sig: Take 1 tablet (10 mg total) by mouth every 6 (six) hours as needed. Must last 30 days    Dispense:  120 tablet    Refill:  0    Chronic Pain: STOP Act (Not applicable) Fill 1 day early if closed on refill date. Do not fill until: 01/15/2020. To last until: 02/14/2020. Avoid benzodiazepines within 8 hours of opioids  . tiZANidine (ZANAFLEX) 4 MG tablet    Sig: Take 1 tablet (4 mg total) by mouth every 8 (eight) hours as needed for muscle spasms. Max: 3/day    Dispense:  90 tablet    Refill:  5    Fill one day early if pharmacy is closed on scheduled refill date. May substitute for generic if available.  . diclofenac Sodium (VOLTAREN) 1 % GEL    Sig: Apply 4 g topically 4 (four) times daily.    Dispense:  350 g    Refill:  8    Fill one day early if pharmacy is closed on scheduled refill date. May  substitute for generic if available.   Orders:  Orders Placed This Encounter  Procedures  . HIP INJECTION    Standing Status:   Future    Standing Expiration Date:   04/11/2020    Scheduling Instructions:     Side: Bilateral     Sedation: With Sedation.     Timeframe: ASAP  . HIP INJECTION    Standing Status:   Future    Standing Expiration Date:   04/11/2020    Scheduling Instructions:     Purpose: Therapeutic     Indication: Hip pain 2ry to Trochanteric Burlitis bilateral (M70.61, M70.62).     Side: Bilateral     Sedation: With Sedation.     Timeframe: As soon as the schedule permits.  Raelene Bott Select 13 (MW), Urine    Volume: 30 ml(s). Minimum 3 ml of urine is needed. Document temperature of fresh sample. Indications: Long term (current) use of opiate analgesic (O96.295)    Order Specific Question:   Release to patient    Answer:   Immediate   Follow-up  plan:   Return for Procedure (w/ sedation): (B) IA Hip inj. #3 + (B) trochanteric bursa inj. #2 and UDS.      Considering:  Possible bilateral hip jointRFA Diagnostic caudal ESI + epidurogram  Possible Racz procedure.  Possible bilateral cervical facetRFA Diagnostic right-sided CESI  Diagnostic left sacroiliac joint block Possible bilateral sacroiliac joint RFA   Palliative PRN treatment(s): Palliative bilateral trochanteric bursa #2  Palliative bilateralsuprascapular nerve block #3 Palliative bilateral suprascapular nerveRFA #2(last done on 05/13/2019) Diagnostic bilateral IA hip joint injection #3 (last done 04/30/2018)  Diagnostic bilateral lumbar facet block #4 Palliative right lumbar facet RFA #3(last done 12/16/2017)  Palliative left lumbar facet RFA #3(last done 02/12/2018)  Diagnostic bilateral cervical facet block #2 Diagnostic right SI joint block #2    Recent Visits Date Type Provider Dept  10/13/19 Telemedicine Milinda Pointer, MD [facility]  Showing recent visits  within past 90 days and meeting all other requirements Today's Visits Date Type Provider Dept  01/10/20 Telemedicine Milinda Pointer, MD [facility]  Showing today's visits and meeting all other requirements Future Appointments No visits were found meeting these conditions. Showing future appointments within next 90 days and meeting all other requirements  I discussed the assessment and treatment plan with the patient. The patient was provided an opportunity to ask questions and all were answered. The patient agreed with the plan and demonstrated an understanding of the instructions.  Patient advised to call back or seek an in-person evaluation if the symptoms or condition worsens.  Duration of encounter: 15 minutes.  Note by: Gaspar Cola, MD Date: 01/10/2020; Time: 8:04 AM

## 2020-01-10 ENCOUNTER — Other Ambulatory Visit: Payer: Self-pay

## 2020-01-10 ENCOUNTER — Ambulatory Visit: Payer: Medicaid Other | Attending: Pain Medicine | Admitting: Pain Medicine

## 2020-01-10 DIAGNOSIS — M7061 Trochanteric bursitis, right hip: Secondary | ICD-10-CM

## 2020-01-10 DIAGNOSIS — M7918 Myalgia, other site: Secondary | ICD-10-CM

## 2020-01-10 DIAGNOSIS — F1120 Opioid dependence, uncomplicated: Secondary | ICD-10-CM

## 2020-01-10 DIAGNOSIS — M159 Polyosteoarthritis, unspecified: Secondary | ICD-10-CM

## 2020-01-10 DIAGNOSIS — M25551 Pain in right hip: Secondary | ICD-10-CM | POA: Diagnosis not present

## 2020-01-10 DIAGNOSIS — M160 Bilateral primary osteoarthritis of hip: Secondary | ICD-10-CM | POA: Diagnosis not present

## 2020-01-10 DIAGNOSIS — Z79899 Other long term (current) drug therapy: Secondary | ICD-10-CM

## 2020-01-10 DIAGNOSIS — G894 Chronic pain syndrome: Secondary | ICD-10-CM

## 2020-01-10 DIAGNOSIS — M7062 Trochanteric bursitis, left hip: Secondary | ICD-10-CM

## 2020-01-10 DIAGNOSIS — M62830 Muscle spasm of back: Secondary | ICD-10-CM

## 2020-01-10 DIAGNOSIS — G8929 Other chronic pain: Secondary | ICD-10-CM

## 2020-01-10 DIAGNOSIS — M8949 Other hypertrophic osteoarthropathy, multiple sites: Secondary | ICD-10-CM

## 2020-01-10 DIAGNOSIS — M25552 Pain in left hip: Secondary | ICD-10-CM

## 2020-01-10 MED ORDER — OXYCODONE HCL 10 MG PO TABS: 10.0000 mg | ORAL_TABLET | Freq: Four times a day (QID) | ORAL | 0 refills | Status: DC | PRN

## 2020-01-10 MED ORDER — TIZANIDINE HCL 4 MG PO TABS: 4.0000 mg | ORAL_TABLET | Freq: Three times a day (TID) | ORAL | 5 refills | Status: DC | PRN

## 2020-01-10 MED ORDER — DICLOFENAC SODIUM 1 % EX GEL: 4.0000 g | Freq: Four times a day (QID) | CUTANEOUS | 8 refills | Status: DC

## 2020-01-10 NOTE — Patient Instructions (Signed)
____________________________________________________________________________________________  Preparing for Procedure with Sedation  Procedure appointments are limited to planned procedures: . No Prescription Refills. . No disability issues will be discussed. . No medication changes will be discussed.  Instructions: . Oral Intake: Do not eat or drink anything for at least 8 hours prior to your procedure. (Exception: Blood Pressure Medication. See below.) . Transportation: Unless otherwise stated by your physician, you may drive yourself after the procedure. . Blood Pressure Medicine: Do not forget to take your blood pressure medicine with a sip of water the morning of the procedure. If your Diastolic (lower reading)is above 100 mmHg, elective cases will be cancelled/rescheduled. . Blood thinners: These will need to be stopped for procedures. Notify our staff if you are taking any blood thinners. Depending on which one you take, there will be specific instructions on how and when to stop it. . Diabetics on insulin: Notify the staff so that you can be scheduled 1st case in the morning. If your diabetes requires high dose insulin, take only  of your normal insulin dose the morning of the procedure and notify the staff that you have done so. . Preventing infections: Shower with an antibacterial soap the morning of your procedure. . Build-up your immune system: Take 1000 mg of Vitamin C with every meal (3 times a day) the day prior to your procedure. . Antibiotics: Inform the staff if you have a condition or reason that requires you to take antibiotics before dental procedures. . Pregnancy: If you are pregnant, call and cancel the procedure. . Sickness: If you have a cold, fever, or any active infections, call and cancel the procedure. . Arrival: You must be in the facility at least 30 minutes prior to your scheduled procedure. . Children: Do not bring children with you. . Dress appropriately:  Bring dark clothing that you would not mind if they get stained. . Valuables: Do not bring any jewelry or valuables.  Reasons to call and reschedule or cancel your procedure: (Following these recommendations will minimize the risk of a serious complication.) . Surgeries: Avoid having procedures within 2 weeks of any surgery. (Avoid for 2 weeks before or after any surgery). . Flu Shots: Avoid having procedures within 2 weeks of a flu shots or . (Avoid for 2 weeks before or after immunizations). . Barium: Avoid having a procedure within 7-10 days after having had a radiological study involving the use of radiological contrast. (Myelograms, Barium swallow or enema study). . Heart attacks: Avoid any elective procedures or surgeries for the initial 6 months after a "Myocardial Infarction" (Heart Attack). . Blood thinners: It is imperative that you stop these medications before procedures. Let us know if you if you take any blood thinner.  . Infection: Avoid procedures during or within two weeks of an infection (including chest colds or gastrointestinal problems). Symptoms associated with infections include: Localized redness, fever, chills, night sweats or profuse sweating, burning sensation when voiding, cough, congestion, stuffiness, runny nose, sore throat, diarrhea, nausea, vomiting, cold or Flu symptoms, recent or current infections. It is specially important if the infection is over the area that we intend to treat. . Heart and lung problems: Symptoms that may suggest an active cardiopulmonary problem include: cough, chest pain, breathing difficulties or shortness of breath, dizziness, ankle swelling, uncontrolled high or unusually low blood pressure, and/or palpitations. If you are experiencing any of these symptoms, cancel your procedure and contact your primary care physician for an evaluation.  Remember:  Regular Business hours are:    Monday to Thursday 8:00 AM to 4:00 PM  Provider's  Schedule: Amena Dockham, MD:  Procedure days: Tuesday and Thursday 7:30 AM to 4:00 PM  Bilal Lateef, MD:  Procedure days: Monday and Wednesday 7:30 AM to 4:00 PM ____________________________________________________________________________________________    

## 2020-01-12 NOTE — Progress Notes (Signed)
PROVIDER NOTE: Information contained herein reflects review and annotations entered in association with encounter. Interpretation of such information and data should be left to medically-trained personnel. Information provided to patient can be located elsewhere in the medical record under "Patient Instructions". Document created using STT-dictation technology, any transcriptional errors that may result from process are unintentional.    Patient: Danielle [name]  Service Category: Procedure  Provider: Gaspar Cola, MD  DOB: 23-Sep-1960  DOS: 01/13/2020  Location: [facility]  MRN: [id number]  Setting: Ambulatory - outpatient  Referring Provider: Care, [place] [facility]  Type: Established Patient  Specialty: Interventional Pain Management  PCP: Care, [place] [facility]   Primary Reason for Visit: Interventional Pain Management Treatment. CC: Hip Pain (bilateral)  Procedure #1:  Anesthesia, Analgesia, Anxiolysis:  Type: Intra-Articular Hip Injection #3  Primary Purpose: Diagnostic Region: Posterolateral hip joint area. Level: Lower pelvic and hip joint level. Target Area: Superior aspect of the hip joint cavity, going thru the superior portion of the capsular ligament. Approach: Posterolateral approach. Laterality: Bilateral  Type: Moderate (Conscious) Sedation combined with Local Anesthesia Indication(s): Analgesia and Anxiety Route: Intravenous (IV) IV Access: Secured Sedation: Meaningful verbal contact was maintained at all times during the procedure  Local Anesthetic: Lidocaine 1-2%  Position: Lateral Decubitus with bad side up Area Prepped: Entire Posterolateral hip area. DuraPrep (Iodine Povacrylex [0.7% available iodine] and Isopropyl Alcohol, 74% w/w)   Procedure #2:    Type: Trochanteric Bursa Injection #2  Primary Purpose: Diagnostic Region: Upper (proximal) Femoral Region Level: Hip Joint Target Area: Superior aspect of the hip joint cavity, going thru the  superior portion of the capsular ligament. Approach: Posterolateral approach Laterality: Bilateral     Indications: 1. Chronic pain syndrome   2. Chronic hip pain (2ry area of Pain) (Bilateral) (R>L)   3. 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)   4. Primary osteoarthritis of both hips   5. Chronic trochanteric bursitis (Bilateral)    History of allergy to iodine & radiographic dye    Pain Score: Pre-procedure: 7 /10 Post-procedure: 0-No pain/10   Pre-op Assessment:  Danielle [name] is a 59 y.o. (year old), female patient, seen today for interventional treatment. She  has a past surgical history that includes necksurgery (10/21/2014); Back surgery (2013); Foot surgery (Right); Elbow surgery (Left); Carpal tunnel release (Right); Nose surgery; Partial hysterectomy; Total shoulder replacement (Bilateral); and Colonoscopy with propofol (N/A, 02/05/2017). Danielle [name] has a current medication list which includes the following prescription(s): albuterol, cholecalciferol, citalopram, diclofenac sodium, docusate sodium, fluticasone-salmeterol, guaifenesin, levothyroxine, magnesium hydroxide, naloxone, nystatin, [START ON 01/15/2020] oxycodone hcl, tiotropium, [START ON 01/15/2020] tizanidine, varenicline, and fexofenadine-pseudoephedrine, and the following Facility-Administered Medications: fentanyl, lactated ringers, and midazolam. Her primarily concern today is the Hip Pain (bilateral)  Initial Vital Signs:  Pulse/HCG Rate: 89ECG Heart Rate: 86 Temp: (!) 97.1 F (36.2 C) Resp: 20 BP: (!) 148/82 SpO2: 97 %  BMI: Estimated body mass index is 41.6 kg/m as calculated from the following:   Height as of this encounter: 5\' 5"  (1.651 m).   Weight as of this encounter: 250 lb (113.4 kg).  Risk Assessment: Allergies: Reviewed. She is allergic to aripiprazole, duloxetine, gabapentin, gold, iodinated diagnostic agents, naproxen sodium, nortriptyline hcl, nsaids, pregabalin, meperidine, ace inhibitors, aspirin,  cefpodoxime, fluoxetine, fluticasone-salmeterol, levofloxacin, lithium, paroxetine, tape, telithromycin, theophylline, topiramate, trazodone, triamcinolone, venlafaxine, bupropion, ketorolac tromethamine, morphine, and moxifloxacin.  Allergy Precautions: None required Coagulopathies: Reviewed. None identified.  Blood-thinner therapy: None at this time Active Infection(s): Reviewed. None identified. Danielle [name]  is afebrile  Site Confirmation: Danielle [name] was asked to confirm the procedure and laterality before marking the site Procedure checklist: Completed Consent: Before the procedure and under the influence of no sedative(s), amnesic(s), or anxiolytics, the patient was informed of the treatment options, risks and possible complications. To fulfill our ethical and legal obligations, as recommended by the American Medical Association's Code of Ethics, I have informed the patient of my clinical impression; the nature and purpose of the treatment or procedure; the risks, benefits, and possible complications of the intervention; the alternatives, including doing nothing; the risk(s) and benefit(s) of the alternative treatment(s) or procedure(s); and the risk(s) and benefit(s) of doing nothing. The patient was provided information about the general risks and possible complications associated with the procedure. These may include, but are not limited to: failure to achieve desired goals, infection, bleeding, organ or nerve damage, allergic reactions, paralysis, and death. In addition, the patient was informed of those risks and complications associated to the procedure, such as failure to decrease pain; infection; bleeding; organ or nerve damage with subsequent damage to sensory, motor, and/or autonomic systems, resulting in permanent pain, numbness, and/or weakness of one or several areas of the body; allergic reactions; (i.e.: anaphylactic reaction); and/or death. Furthermore, the patient was informed of  those risks and complications associated with the medications. These include, but are not limited to: allergic reactions (i.e.: anaphylactic or anaphylactoid reaction(s)); adrenal axis suppression; blood sugar elevation that in diabetics may result in ketoacidosis or comma; water retention that in patients with history of congestive heart failure may result in shortness of breath, pulmonary edema, and decompensation with resultant heart failure; weight gain; swelling or edema; medication-induced neural toxicity; particulate matter embolism and blood vessel occlusion with resultant organ, and/or nervous system infarction; and/or aseptic necrosis of one or more joints. Finally, the patient was informed that Medicine is not an exact science; therefore, there is also the possibility of unforeseen or unpredictable risks and/or possible complications that may result in a catastrophic outcome. The patient indicated having understood very clearly. We have given the patient no guarantees and we have made no promises. Enough time was given to the patient to ask questions, all of which were answered to the patient's satisfaction. Danielle [name] has indicated that she wanted to continue with the procedure. Attestation: I, the ordering provider, attest that I have discussed with the patient the benefits, risks, side-effects, alternatives, likelihood of achieving goals, and potential problems during recovery for the procedure that I have provided informed consent. Date  Time: 01/13/2020  9:31 AM  Pre-Procedure Preparation:  Monitoring: As per clinic protocol. Respiration, ETCO2, SpO2, BP, heart rate and rhythm monitor placed and checked for adequate function Safety Precautions: Patient was assessed for positional comfort and pressure points before starting the procedure. Time-out: I initiated and conducted the "Time-out" before starting the procedure, as per protocol. The patient was asked to participate by confirming the  accuracy of the "Time Out" information. Verification of the correct person, site, and procedure were performed and confirmed by me, the nursing staff, and the patient. "Time-out" conducted as per Joint Commission's Universal Protocol (UP.01.01.01). Time: 1025  Description of Procedure #1:  Safety Precautions: Aspiration looking for blood return was conducted prior to all injections. At no point did we inject any substances, as a needle was being advanced. No attempts were made at seeking any paresthesias. Safe injection practices and needle disposal techniques used. Medications properly checked for expiration dates. SDV (single dose vial) medications used.  Description of the Procedure: Protocol guidelines were followed. The patient was placed in position over the fluoroscopy table. The target area was identified and the area prepped in the usual manner. Skin & deeper tissues infiltrated with local anesthetic. Appropriate amount of time allowed to pass for local anesthetics to take effect. The procedure needles were then advanced to the target area. Proper needle placement secured. Negative aspiration confirmed. Solution injected in intermittent fashion, asking for systemic symptoms every 0.5cc of injectate. The needles were then removed and the area cleansed, making sure to leave some of the prepping solution back to take advantage of its long term bactericidal properties.  Start Time: 1025 hrs. Materials:  Needle(s) Type: Spinal Needle Gauge: 22G Length: 7-in Medication(s): Please see orders for medications and dosing details.  Imaging Guidance (Non-Spinal):          Type of Imaging Technique: Fluoroscopy Guidance (Non-Spinal) Indication(s): Assistance in needle guidance and placement for procedures requiring needle placement in or near specific anatomical locations not easily accessible without such assistance. Exposure Time: Please see nurses notes. Contrast: Before injecting any contrast, we  confirmed that the patient did not have an allergy to iodine, shellfish, or radiological contrast. Once satisfactory needle placement was completed at the desired level, radiological contrast was injected. Contrast injected under live fluoroscopy. No contrast complications. See chart for type and volume of contrast used. Fluoroscopic Guidance: I was personally present during the use of fluoroscopy. "Tunnel Vision Technique" used to obtain the best possible view of the target area. Parallax error corrected before commencing the procedure. "Direction-depth-direction" technique used to introduce the needle under continuous pulsed fluoroscopy. Once target was reached, antero-posterior, oblique, and lateral fluoroscopic projection used confirm needle placement in all planes. Images permanently stored in EMR. Interpretation: I personally interpreted the imaging intraoperatively. Adequate needle placement confirmed in multiple planes. Appropriate spread of contrast into desired area was observed. No evidence of afferent or efferent intravascular uptake. Permanent images saved into the patient's record.   Description of Procedure #2:  Description of the Procedure: Skin & deeper tissues infiltrated with local anesthetic. Appropriate amount of time allowed to pass for local anesthetics to take effect. The procedure needles were then advanced to the target area. Proper needle placement secured. Negative aspiration confirmed. Solution injected in intermittent fashion, asking for systemic symptoms every 0.5cc of injectate. The needles were then removed and the area cleansed, making sure to leave some of the prepping solution back to take advantage of its long term bactericidal properties.  Vitals:   01/13/20 1038 01/13/20 1049 01/13/20 1058 01/13/20 1100  BP: 116/72 104/66 111/68 120/67  Pulse:      Resp: 14 11 13 12   Temp:  97.6 F (36.4 C)  (!) 97.4 F (36.3 C)  SpO2: 98% 98% 98% 98%  Weight:      Height:          End Time: 1035 hrs.  Materials:  Needle(s) Type: Spinal Needle Gauge: 22G Length: 5.0-in Medication(s): Please see orders for medications and dosing details.  Imaging Guidance (Non-Spinal):          Type of Imaging Technique: Fluoroscopy Guidance (Non-Spinal) Indication(s): Assistance in needle guidance and placement for procedures requiring needle placement in or near specific anatomical locations not easily accessible without such assistance. Exposure Time: Please see nurses notes. Contrast: Before injecting any contrast, we confirmed that the patient did not have an allergy to iodine, shellfish, or radiological contrast. Once satisfactory needle placement was completed at the desired  level, radiological contrast was injected. Contrast injected under live fluoroscopy. No contrast complications. See chart for type and volume of contrast used. Fluoroscopic Guidance: I was personally present during the use of fluoroscopy. "Tunnel Vision Technique" used to obtain the best possible view of the target area. Parallax error corrected before commencing the procedure. "Direction-depth-direction" technique used to introduce the needle under continuous pulsed fluoroscopy. Once target was reached, antero-posterior, oblique, and lateral fluoroscopic projection used confirm needle placement in all planes. Images permanently stored in EMR. Interpretation: I personally interpreted the imaging intraoperatively. Adequate needle placement confirmed in multiple planes. Appropriate spread of contrast into desired area was observed. No evidence of afferent or efferent intravascular uptake. Permanent images saved into the patient's record.  Antibiotic Prophylaxis:   Anti-infectives (From admission, onward)   None     Indication(s): None identified  Post-operative Assessment:  Post-procedure Vital Signs:  Pulse/HCG Rate: 8984 Temp: (!) 97.4 F (36.3 C) Resp: 12 BP: 120/67 SpO2: 98 %  EBL:  None  Complications: No immediate post-treatment complications observed by team, or reported by patient.  Note: The patient tolerated the entire procedure well. A repeat set of vitals were taken after the procedure and the patient was kept under observation following institutional policy, for this type of procedure. Post-procedural neurological assessment was performed, showing return to baseline, prior to discharge. The patient was provided with post-procedure discharge instructions, including a section on how to identify potential problems. Should any problems arise concerning this procedure, the patient was given instructions to immediately contact us, at any time, without hesitation. In any case, we plan to contact the patient by telephone for a follow-up status report regarding this interventional procedure.  Comments:  No additional relevant information.  Plan of Care  Orders:  Orders Placed This Encounter  Procedures  . HIP INJECTION    Scheduling Instructions:     Side: Bilateral     Sedation: Patient's choice.     Timeframe: Today  . HIP INJECTION    Purpose: Therapeutic/Diagnostic Indication: Hip pain 2ry to Trochanteric Burlitis bilateral (M70.61, M70.62).    Scheduling Instructions:     Procedure: Trochanteric bursa injection     Laterality: Bilateral     Sedation: Patient's choice.     Timeframe: Today  . DG PA[facility] C-ARM 1-60 MIN NO REPORT    Intraoperative interpretation by procedural physician at [facility].    Standing Status:   Standing    Number of Occurrences:   1    Order Specific Question:   Reason for exam:    Answer:   Assistance in needle guidance and placement for procedures requiring needle placement in or near specific anatomical locations not easily accessible without such assistance.  Danielle [name] Select 13 (MW), Urine    Volume: 30 ml(s). Minimum 3 ml of urine is needed. Document temperature of fresh sample. Indications: Long term  (current) use of opiate analgesic (W40.973)    Order Specific Question:   Release to patient    Answer:   Immediate  . Informed Consent Details: Physician/Practitioner Attestation; Transcribe to consent form and obtain patient signature    Nursing Order: Transcribe to consent form and obtain patient signature. Note: Always confirm laterality of pain with Danielle [name], before procedure. Procedure: Hip injection Indication/Reason: Hip Joint Pain (Arthralgia) Provider Attestation: I, [place] [name], MD, (Pain Management Specialist), the physician/practitioner, attest that I have discussed with the patient the benefits, risks, side effects, alternatives, likelihood of achieving goals and potential problems  during recovery for the procedure that I have provided informed consent.  . Informed Consent Details: Physician/Practitioner Attestation; Transcribe to consent form and obtain patient signature    Provider Attestation: I, [place] [name], MD, (Pain Management Specialist), the physician/practitioner, attest that I have discussed with the patient the benefits, risks, side effects, alternatives, likelihood of achieving goals and potential problems during recovery for the procedure that I have provided informed consent.    Scheduling Instructions:     Procedure: Bilateral trochanteric bursa injection     Indication/Reason: Bilateral trochanteric bursitis     Note: Always confirm laterality of pain with Danielle [name], before procedure.     Transcribe to consent form and obtain patient signature.  . Provide equipment / supplies at bedside    Equipment required: Single use, disposable, "Block Tray"    Standing Status:   Standing    Number of Occurrences:   1    Order Specific Question:   Specify    Answer:   Block Tray  . Miscellanous precautions    Standing Status:   Standing    Number of Occurrences:   1  . Miscellanous precautions    NOTE: Although It is true that patients can have  allergies to shellfish and that shellfish contain iodine, most shellfish  allergies are due to two protein allergens present in the shellfish: tropomyosins and parvalbumin. Not all patients with shellfish allergies are allergic to iodine. However, as a precaution, avoid using iodine containing products.    Standing Status:   Standing    Number of Occurrences:   1   Chronic Opioid Analgesic:  Oxycodone IR 10 mg, 1 tab PO q 6 hrs (40 mg/dayof oxycodone) MME/day:60mg /day.   Medications ordered for procedure: Meds ordered this encounter  Medications  . lidocaine (XYLOCAINE) 2 % (with pres) injection 400 mg  . lactated ringers infusion 1,000 mL  . midazolam (VERSED) 5 MG/5ML injection 1-2 mg    Make sure Flumazenil is available in the pyxis when using this medication. If oversedation occurs, administer 0.2 mg IV over 15 sec. If after 45 sec no response, administer 0.2 mg again over 1 min; may repeat at 1 min intervals; not to exceed 4 doses (1 mg)  . fentaNYL (SUBLIMAZE) injection 25-50 mcg    Make sure Narcan is available in the pyxis when using this medication. In the event of respiratory depression (RR< 8/min): Titrate NARCAN (naloxone) in increments of 0.1 to 0.2 mg IV at 2-3 minute intervals, until desired degree of reversal.  . methylPREDNISolone acetate (DEPO-MEDROL) injection 80 mg  . ropivacaine (PF) 2 mg/mL (0.2%) (NAROPIN) injection 9 mL  . ropivacaine (PF) 2 mg/mL (0.2%) (NAROPIN) injection 9 mL  . methylPREDNISolone acetate (DEPO-MEDROL) injection 80 mg   Medications administered: We administered lidocaine, midazolam, fentaNYL, methylPREDNISolone acetate, ropivacaine (PF) 2 mg/mL (0.2%), ropivacaine (PF) 2 mg/mL (0.2%), and methylPREDNISolone acetate.  See the medical record for exact dosing, route, and time of administration.  Follow-up plan:   Return for F2F(20-min), (PP), am on eval day.       Considering:  Possible bilateral hip jointRFA Diagnostic caudal ESI +  epidurogram  Possible Racz procedure.  Possible bilateral cervical facetRFA Diagnostic right-sided CESI  Diagnostic left sacroiliac joint block Possible bilateral sacroiliac joint RFA   Palliative PRN treatment(s): Palliative bilateral trochanteric bursa #2  Palliative bilateralsuprascapular nerve block #3 Palliative bilateral suprascapular nerveRFA #2(last done on 05/13/2019) Diagnostic bilateral IA hip joint injection #3 (last done 04/30/2018)  Diagnostic bilateral lumbar  facet block #4 Palliative right lumbar facet RFA #3(last done 12/16/2017)  Palliative left lumbar facet RFA #3(last done 02/12/2018)  Diagnostic bilateral cervical facet block #2 Diagnostic right SI joint block #2    Recent Visits Date Type Provider Dept  01/10/20 Telemedicine Milinda Pointer, MD [facility]  Showing recent visits within past 90 days and meeting all other requirements Today's Visits Date Type Provider Dept  01/13/20 Procedure visit Milinda Pointer, MD [facility]  Showing today's visits and meeting all other requirements Future Appointments Date Type Provider Dept  02/15/20 Appointment Milinda Pointer, MD [facility]  Showing future appointments within next 90 days and meeting all other requirements  Disposition: Discharge home  Discharge (Date  Time): 01/13/2020; 1112 hrs.   Primary Care Physician: Care, [place] [facility] Location: [facility] Note by: Gaspar Cola, MD Date: 01/13/2020; Time: 1:16 PM  Disclaimer:  Medicine is not an exact science. The only guarantee in medicine is that nothing is guaranteed. It is important to note that the decision to proceed with this intervention was based on the information collected from the patient. The Data and conclusions were drawn from the patient's questionnaire, the interview, and the physical examination. Because the information was provided in large part by the  patient, it cannot be guaranteed that it has not been purposely or unconsciously manipulated. Every effort has been made to obtain as much relevant data as possible for this evaluation. It is important to note that the conclusions that lead to this procedure are derived in large part from the available data. Always take into account that the treatment will also be dependent on availability of resources and existing treatment guidelines, considered by other Pain Management Practitioners as being common knowledge and practice, at the time of the intervention. For Medico-Legal purposes, it is also important to point out that variation in procedural techniques and pharmacological choices are the acceptable norm. The indications, contraindications, technique, and results of the above procedure should only be interpreted and judged by a Board-Certified Interventional Pain Specialist with extensive familiarity and expertise in the same exact procedure and technique.

## 2020-01-13 ENCOUNTER — Other Ambulatory Visit: Payer: Self-pay

## 2020-01-13 ENCOUNTER — Ambulatory Visit (HOSPITAL_BASED_OUTPATIENT_CLINIC_OR_DEPARTMENT_OTHER): Payer: Medicaid Other | Admitting: Pain Medicine

## 2020-01-13 ENCOUNTER — Encounter: Payer: Self-pay | Admitting: Pain Medicine

## 2020-01-13 ENCOUNTER — Ambulatory Visit
Admission: RE | Admit: 2020-01-13 | Discharge: 2020-01-13 | Disposition: A | Discharge status: Home / Self Care | Payer: Medicaid Other | Source: Ambulatory Visit | Attending: Pain Medicine | Admitting: Pain Medicine

## 2020-01-13 VITALS — BP 120/67 | HR 89 | Temp 97.4°F | Resp 12 | Ht 65.0 in | Wt 250.0 lb

## 2020-01-13 DIAGNOSIS — M160 Bilateral primary osteoarthritis of hip: Secondary | ICD-10-CM

## 2020-01-13 DIAGNOSIS — M7061 Trochanteric bursitis, right hip: Secondary | ICD-10-CM | POA: Insufficient documentation

## 2020-01-13 DIAGNOSIS — Z91041 Radiographic dye allergy status: Secondary | ICD-10-CM | POA: Insufficient documentation

## 2020-01-13 DIAGNOSIS — M25551 Pain in right hip: Secondary | ICD-10-CM | POA: Insufficient documentation

## 2020-01-13 DIAGNOSIS — Z79899 Other long term (current) drug therapy: Secondary | ICD-10-CM | POA: Diagnosis present

## 2020-01-13 DIAGNOSIS — G8929 Other chronic pain: Secondary | ICD-10-CM | POA: Diagnosis present

## 2020-01-13 DIAGNOSIS — M7062 Trochanteric bursitis, left hip: Secondary | ICD-10-CM | POA: Diagnosis present

## 2020-01-13 DIAGNOSIS — G894 Chronic pain syndrome: Secondary | ICD-10-CM | POA: Diagnosis not present

## 2020-01-13 DIAGNOSIS — M25552 Pain in left hip: Secondary | ICD-10-CM | POA: Diagnosis present

## 2020-01-13 MED ORDER — ROPIVACAINE HCL 2 MG/ML IJ SOLN
INTRAMUSCULAR | Status: AC
  Filled 2020-01-13: qty 10

## 2020-01-13 MED ORDER — METHYLPREDNISOLONE ACETATE 80 MG/ML IJ SUSP
INTRAMUSCULAR | Status: AC
  Filled 2020-01-13: qty 1

## 2020-01-13 MED ORDER — MIDAZOLAM HCL 5 MG/5ML IJ SOLN
1.0000 mg | INTRAMUSCULAR | Status: DC | PRN
  Administered 2020-01-13: 2 mg via INTRAVENOUS

## 2020-01-13 MED ORDER — METHYLPREDNISOLONE ACETATE 80 MG/ML IJ SUSP
80.0000 mg | Freq: Once | INTRAMUSCULAR | Status: AC
  Administered 2020-01-13: 80 mg via INTRA_ARTICULAR

## 2020-01-13 MED ORDER — MIDAZOLAM HCL 5 MG/5ML IJ SOLN
INTRAMUSCULAR | Status: AC
  Filled 2020-01-13: qty 5

## 2020-01-13 MED ORDER — ROPIVACAINE HCL 2 MG/ML IJ SOLN
9.0000 mL | Freq: Once | INTRAMUSCULAR | Status: AC
  Administered 2020-01-13: 9 mL via INTRA_ARTICULAR

## 2020-01-13 MED ORDER — FENTANYL CITRATE (PF) 100 MCG/2ML IJ SOLN
25.0000 ug | INTRAMUSCULAR | Status: DC | PRN
  Administered 2020-01-13: 50 ug via INTRAVENOUS

## 2020-01-13 MED ORDER — LIDOCAINE HCL 2 % IJ SOLN
INTRAMUSCULAR | Status: AC
  Filled 2020-01-13: qty 20

## 2020-01-13 MED ORDER — LIDOCAINE HCL 2 % IJ SOLN
20.0000 mL | Freq: Once | INTRAMUSCULAR | Status: AC
  Administered 2020-01-13: 400 mg

## 2020-01-13 MED ORDER — FENTANYL CITRATE (PF) 100 MCG/2ML IJ SOLN
INTRAMUSCULAR | Status: AC
  Filled 2020-01-13: qty 2

## 2020-01-13 MED ORDER — LACTATED RINGERS IV SOLN: 1000.0000 mL | Freq: Once | INTRAVENOUS | Status: DC

## 2020-01-13 NOTE — Patient Instructions (Signed)

## 2020-01-13 NOTE — Progress Notes (Signed)
Safety precautions to be maintained throughout the outpatient stay will include: orient to surroundings, keep bed in low position, maintain call bell within reach at all times, provide assistance with transfer out of bed and ambulation.  

## 2020-01-14 ENCOUNTER — Telehealth: Payer: Self-pay

## 2020-01-14 NOTE — Telephone Encounter (Signed)
Post procedure phone call.  Patient states she is is hurting after the numbing medication wore off.  Instructed to put heat today and to give it a few days for the steroid to start working.  INstructed to call back with any increase in symptoms, questions or concerns.

## 2020-01-20 LAB — TOXASSURE SELECT 13 (MW), URINE

## 2020-01-26 ENCOUNTER — Ambulatory Visit: Payer: Medicaid Other | Admitting: Pulmonary Disease

## 2020-02-15 ENCOUNTER — Telehealth: Payer: Self-pay | Admitting: Pain Medicine

## 2020-02-15 ENCOUNTER — Ambulatory Visit: Payer: Medicaid Other | Admitting: Pain Medicine

## 2020-02-15 NOTE — Telephone Encounter (Signed)
Spoke with patient and she states that she cancelled her appointment because she thought it was a follow up from a procedure and she has been exposed to covid.    States she did not know that the had only given her 1 prescription last month for oxycodone.  Informed patient that she should always check her AVS to see what medications were ordered and make sure she checks before she cancels any appointments. The patient is out of her medications and has an appointment now on Monday with Dr [name].  Spoke with Dr [name] and he states to put her as a virtual appointment.

## 2020-02-15 NOTE — Progress Notes (Signed)
Patient: Danielle [name]  Service Category: E/M  Provider: Gaspar Cola, MD  DOB: Feb 22, 1961  DOS: 02/16/2020  Location: Office  MRN: [id number]  Setting: Ambulatory outpatient  Referring Provider: [facility]  Type: Established Patient  Specialty: Interventional Pain Management  PCP: [facility]  Location: Remote location  Delivery: TeleHealth     Virtual Encounter - Pain Management PROVIDER NOTE: Information contained herein reflects review and annotations entered in association with encounter. Interpretation of such information and data should be left to medically-trained personnel. Information provided to patient can be located elsewhere in the medical record under "Patient Instructions". Document created using STT-dictation technology, any transcriptional errors that may result from process are unintentional.    Contact & Pharmacy Preferred: [phone] Home: [phone] (home) Mobile: [phone] (mobile) E-mail: [email or web address], [place] [address] [place] [address] Phone: [phone] Fax: [phone]   Pre-screening  Danielle [name] offered "in-person" vs "virtual" encounter. She indicated preferring virtual for this encounter.   Reason COVID-19*  Social distancing based on CDC and AMA recommendations.   I contacted Danielle [name] on 02/16/2020 via telephone.      I clearly identified myself as Gaspar Cola, MD. I verified that I was speaking with the correct person using two identifiers (Name: Danielle [name], and date of birth: 1960-12-29).  Consent I sought verbal advanced consent from Danielle [name] for virtual visit interactions. I informed Danielle [name] of possible security and privacy concerns, risks, and limitations associated with providing "not-in-person" medical evaluation and management services. I also informed Danielle [name] of the availability of "in-person" appointments.  Finally, I informed her that there would be a charge for the virtual visit and that she could be  personally, fully or partially, financially responsible for it. Danielle [name] expressed understanding and agreed to proceed.   Historic Elements   Danielle [name] is a 59 y.o. year old, female patient evaluated today after her last contact with our practice on 02/15/2020. Danielle [name]  has a past medical history of Acute postoperative pain (01/13/2017), Anxiety, Arthritis, Asthma, Bell's palsy, Bursitis, COPD (chronic obstructive pulmonary disease) ([place]), Depression, Fibromyalgia, Heart murmur, Hepatitis C, Hiatal hernia, Hyperlipidemia, Hypertension, IBS (irritable bowel syndrome), Insomnia, and Thyroid disease. She also  has a past surgical history that includes necksurgery (10/21/2014); Back surgery (2013); Foot surgery (Right); Elbow surgery (Left); Carpal tunnel release (Right); Nose surgery; Partial hysterectomy; Total shoulder replacement (Bilateral); and Colonoscopy with propofol (N/A, 02/05/2017). Ms. Skilton has a current medication list which includes the following prescription(s): albuterol, cholecalciferol, citalopram, diclofenac sodium, docusate sodium, fexofenadine-pseudoephedrine, fluticasone-salmeterol, guaifenesin, levothyroxine, magnesium hydroxide, naloxone, nystatin, tiotropium, tizanidine, varenicline, oxycodone hcl, [START ON 03/17/2020] oxycodone hcl, and [START ON 04/16/2020] oxycodone hcl. She  reports that she has been smoking cigarettes. She has a 80.00 pack-year smoking history. She has never used smokeless tobacco. She reports that she does not drink alcohol and does not use drugs. Danielle [name] is allergic to aripiprazole, duloxetine, gabapentin, gold, iodinated diagnostic agents, naproxen sodium, nortriptyline hcl, nsaids, pregabalin, meperidine, ace inhibitors, aspirin, cefpodoxime, fluoxetine, fluticasone-salmeterol, levofloxacin, lithium, paroxetine, tape, telithromycin, theophylline,  topiramate, trazodone, triamcinolone, venlafaxine, bupropion, ketorolac tromethamine, morphine, and moxifloxacin.   HPI  Today, she is being contacted for both, medication management and a post-procedure assessment. The patient indicates doing well with the current medication regimen. No adverse reactions or side effects reported to the medications.  Unfortunately, the patient indicates that  she was around somebody that was Covid positive and she thinks that she might have contracted it.  Right now she refers feeling awful with generalized body aches and I recommended that she contact her primary care physician to see if they think that this is related to Covid.  She indicated that she has an appointment to call him tomorrow.  She also indicated that she feels that she needs to have her shoulders done, but for now she needs to get over this first.  Post-Procedure Evaluation  Procedure (02/15/2020): Bilateral intra-articular hip joint injection #3 + bilateral trochanteric bursa injection #2 under fluoroscopic guidance and IV sedation Pre-procedure pain level: 7/10 Post-procedure: 0/10 (100% relief)  Sedation: Sedation provided.  Effectiveness during initial hour after procedure(Ultra-Short Term Relief): 100 %.  Local anesthetic used: Long-acting (4-6 hours) Effectiveness: Defined as any analgesic benefit obtained secondary to the administration of local anesthetics. This carries significant diagnostic value as to the etiological location, or anatomical origin, of the pain. Duration of benefit is expected to coincide with the duration of the local anesthetic used.  Effectiveness during initial 4-6 hours after procedure(Short-Term Relief): 100 %.  Long-term benefit: Defined as any relief past the pharmacologic duration of the local anesthetics.  Effectiveness past the initial 6 hours after procedure(Long-Term Relief): 80 %.  Current benefits: Defined as benefit that persist at this time.   Analgesia:   80% improved Function: Danielle [name] reports improvement in function ROM: Danielle [name] reports improvement in ROM  Pharmacotherapy Assessment  Analgesic: Oxycodone IR 10 mg, 1 tab PO q 6 hrs (40 mg/dayof oxycodone) MME/day:99m/day.   Monitoring: [place] PMP: PDMP reviewed during this encounter.       Pharmacotherapy: No side-effects or adverse reactions reported. Compliance: No problems identified. Effectiveness: Clinically acceptable. Plan: Refer to "POC".  UDS:  Summary  Date Value Ref Range Status  01/13/2020 Note  Final    Comment:    ==================================================================== [facility] 13 (MW) ==================================================================== Test                             Result       Flag       Units  Drug Present and Declared for Prescription Verification   Oxycodone                      >4587        EXPECTED   ng/mg creat   Oxymorphone                    319          EXPECTED   ng/mg creat   Noroxycodone                   >4587        EXPECTED   ng/mg creat   Noroxymorphone                 79           EXPECTED   ng/mg creat    Sources of oxycodone are scheduled prescription medications.    Oxymorphone, noroxycodone, and noroxymorphone are expected    metabolites of oxycodone. Oxymorphone is also available as a    scheduled prescription medication.  ==================================================================== Test                      Result    Flag   Units  Ref Range   Creatinine              218              mg/dL      >=20 ==================================================================== Declared Medications:  The flagging and interpretation on this report are based on the  following declared medications.  Unexpected results may arise from  inaccuracies in the declared medications.   **Note: The testing scope of this panel includes these medications:   Oxycodone   **Note: The testing scope of  this panel does not include the  following reported medications:   Albuterol (Ventolin HFA)  Citalopram (Celexa)  Diclofenac (Voltaren)  Docusate (Colace)  Fexofenadine (Allegra-D)  Fluticasone (Advair)  Guaifenesin (Mucinex)  Levothyroxine (Synthroid)  Magnesium (Milk of Magnesia)  Naloxone (Narcan)  Nystatin (Nystop)  Pseudoephedrine (Allegra-D)  Salmeterol (Advair)  Tiotropium (Spiriva)  Tizanidine (Zanaflex)  Varenicline (Chantix)  Vitamin D ==================================================================== For clinical consultation, please call [phone]. ====================================================================     Laboratory Chemistry Profile   Renal Lab Results  Component Value Date   BUN 14 04/28/2019   CREATININE 0.98 04/28/2019   BCR 13 02/16/2019   GFRAA >60 04/28/2019   GFRNONAA >60 04/28/2019     Hepatic Lab Results  Component Value Date   AST 18 04/28/2019   ALT 16 04/28/2019   ALBUMIN 3.7 04/28/2019   ALKPHOS 80 04/28/2019     Electrolytes Lab Results  Component Value Date   NA 139 04/28/2019   K 4.1 04/28/2019   CL 105 04/28/2019   CALCIUM 9.3 04/28/2019   MG 2.1 02/16/2019     Bone Lab Results  Component Value Date   25OHVITD1 27 (L) 02/16/2019   25OHVITD2 2.0 02/16/2019   25OHVITD3 25 02/16/2019     Inflammation (CRP: Acute Phase) (ESR: Chronic Phase) Lab Results  Component Value Date   CRP 2 02/16/2019   ESRSEDRATE 23 02/16/2019       Note: Above Lab results reviewed.   Imaging  DG PAIN CLINIC C-ARM 1-60 MIN NO REPORT Fluoro was used, but no Radiologist interpretation will be provided.  Please refer to "NOTES" tab for provider progress note.  Assessment  The primary encounter diagnosis was Chronic pain syndrome. Diagnoses of Chronic shoulder pain (1ry area of Pain) (Bilateral) (R>L), Chronic hip pain (2ry area of Pain) (Bilateral) (R>L), Chronic trochanteric bursitis (Bilateral), Chronic low back pain  (3ry area of Pain) (Bilateral) (R>L), and Pharmacologic therapy were also pertinent to this visit.  Plan of Care  Problem-specific:  No problem-specific Assessment & Plan notes found for this encounter.  Danielle [name] has a current medication list which includes the following long-term medication(s): albuterol, citalopram, diclofenac sodium, fluticasone-salmeterol, levothyroxine, tizanidine, oxycodone hcl, [START ON 03/17/2020] oxycodone hcl, and [START ON 04/16/2020] oxycodone hcl.  Pharmacotherapy (Medications Ordered): Meds ordered this encounter  Medications  . Oxycodone HCl 10 MG TABS    Sig: Take 1 tablet (10 mg total) by mouth every 6 (six) hours as needed. Must last 30 days    Dispense:  120 tablet    Refill:  0    Chronic Pain: STOP Act (Not applicable) Fill 1 day early if closed on refill date. Do not fill until: 02/16/2020. To last until: 03/17/2020. Avoid benzodiazepines within 8 hours of opioids  . Oxycodone HCl 10 MG TABS    Sig: Take 1 tablet (10 mg total) by mouth every 6 (six) hours as needed. Must last 30 days    Dispense:  120 tablet    Refill:  0    Chronic Pain: STOP Act (Not applicable) Fill 1 day early if closed on refill date. Do not fill until: 03/17/2020. To last until: 04/16/2020. Avoid benzodiazepines within 8 hours of opioids  . Oxycodone HCl 10 MG TABS    Sig: Take 1 tablet (10 mg total) by mouth every 6 (six) hours as needed. Must last 30 days    Dispense:  120 tablet    Refill:  0    Chronic Pain: STOP Act (Not applicable) Fill 1 day early if closed on refill date. Do not fill until: 04/16/2020. To last until: 05/16/2020. Avoid benzodiazepines within 8 hours of opioids   Orders:  No orders of the defined types were placed in this encounter.  Follow-up plan:   Return in about 3 months (around 05/15/2020) for F2F encounter, 20-min, MM (on eval day).      Considering:  Possible bilateral hip jointRFA Diagnostic caudal ESI + epidurogram  Possible Racz  procedure.  Possible bilateral cervical facetRFA Diagnostic right-sided CESI  Diagnostic left sacroiliac joint block Possible bilateral sacroiliac joint RFA   Palliative PRN treatment(s): Palliative bilateral trochanteric bursa #2  Palliative bilateralsuprascapular nerve block #3 Palliative bilateral suprascapular nerveRFA #2(last done on 05/13/2019) Diagnostic bilateral IA hip joint injection #3 (last done 04/30/2018)  Diagnostic bilateral lumbar facet block #4 Palliative right lumbar facet RFA #3(last done 12/16/2017)  Palliative left lumbar facet RFA #3(last done 02/12/2018)  Diagnostic bilateral cervical facet block #2 Diagnostic right SI joint block #2     Recent Visits Date Type Provider Dept  01/13/20 Procedure visit Milinda Pointer, MD [facility]  01/10/20 Telemedicine Milinda Pointer, MD [facility]  Showing recent visits within past 90 days and meeting all other requirements Today's Visits Date Type Provider Dept  02/16/20 Telemedicine Milinda Pointer, MD [facility]  Showing today's visits and meeting all other requirements Future Appointments No visits were found meeting these conditions. Showing future appointments within next 90 days and meeting all other requirements  I discussed the assessment and treatment plan with the patient. The patient was provided an opportunity to ask questions and all were answered. The patient agreed with the plan and demonstrated an understanding of the instructions.  Patient advised to call back or seek an in-person evaluation if the symptoms or condition worsens.  Duration of encounter: 18 minutes.  Note by: Gaspar Cola, MD Date: 02/16/2020; Time: 4:20 PM

## 2020-02-15 NOTE — Telephone Encounter (Signed)
Pharmacy lvmail stating Oxy 10mg  is on back order, can they get a script for Oxy 5mg , they have those

## 2020-02-16 ENCOUNTER — Other Ambulatory Visit: Payer: Self-pay

## 2020-02-16 ENCOUNTER — Ambulatory Visit: Payer: Medicaid Other | Attending: Pain Medicine | Admitting: Pain Medicine

## 2020-02-16 ENCOUNTER — Ambulatory Visit: Payer: Medicaid Other | Admitting: Pain Medicine

## 2020-02-16 DIAGNOSIS — M5441 Lumbago with sciatica, right side: Secondary | ICD-10-CM

## 2020-02-16 DIAGNOSIS — M25511 Pain in right shoulder: Secondary | ICD-10-CM

## 2020-02-16 DIAGNOSIS — M25551 Pain in right hip: Secondary | ICD-10-CM | POA: Diagnosis not present

## 2020-02-16 DIAGNOSIS — G8929 Other chronic pain: Secondary | ICD-10-CM

## 2020-02-16 DIAGNOSIS — G894 Chronic pain syndrome: Secondary | ICD-10-CM | POA: Diagnosis not present

## 2020-02-16 DIAGNOSIS — M7062 Trochanteric bursitis, left hip: Secondary | ICD-10-CM

## 2020-02-16 DIAGNOSIS — M7061 Trochanteric bursitis, right hip: Secondary | ICD-10-CM | POA: Diagnosis not present

## 2020-02-16 DIAGNOSIS — M25512 Pain in left shoulder: Secondary | ICD-10-CM

## 2020-02-16 DIAGNOSIS — M25552 Pain in left hip: Secondary | ICD-10-CM

## 2020-02-16 DIAGNOSIS — Z79899 Other long term (current) drug therapy: Secondary | ICD-10-CM

## 2020-02-16 MED ORDER — OXYCODONE HCL 10 MG PO TABS: 10.0000 mg | ORAL_TABLET | Freq: Four times a day (QID) | ORAL | 0 refills | Status: DC | PRN

## 2020-02-16 NOTE — Telephone Encounter (Signed)
Patient is scheduled 02-16-20 VV

## 2020-02-21 ENCOUNTER — Ambulatory Visit: Payer: Medicaid Other | Admitting: Pain Medicine

## 2020-03-06 ENCOUNTER — Ambulatory Visit: Payer: Medicaid Other | Admitting: Pulmonary Disease

## 2020-03-13 ENCOUNTER — Ambulatory Visit: Payer: Medicaid Other | Admitting: Pulmonary Disease

## 2020-03-17 ENCOUNTER — Telehealth: Payer: Self-pay | Admitting: Pulmonary Disease

## 2020-03-17 NOTE — Telephone Encounter (Signed)
Spoke with Almyra Free at [facility]  She states pt needing PA for Trelegy  No form ever received  She is going to refax- will await fax

## 2020-03-23 ENCOUNTER — Telehealth: Payer: Self-pay

## 2020-03-23 NOTE — Telephone Encounter (Signed)
Called and spoke with [facility] to see if they still needed the PA for Trelegy. I was informed they do still need it. Asked for them to fax over PA again so that we can get it taken care of. Informed pharmacist that as of June it looked like patient is supposed to be on Advair and Spiriva because Trelegy PA was denied. Will also send this to Dr. Patsey Berthold to get confirmation on which inhaler patient is really supposed to be on. PA has been refaxed to [place] office. Will wait for fax and response from Dr. Patsey Berthold.  Dr. Patsey Berthold please advise

## 2020-03-23 NOTE — Telephone Encounter (Signed)
Contacted patient for lung CT screening program after receiving referral from Dr. Patsey Berthold. I spoke with patient and she recalls discussion of the program with her physician.  She states right now she has a lot going on including moving and feels she does not want know right now if she has cancer.  She is agreeable for our clinic to call her back in a few months to learn if it is a better time for her to begin lung cancer screening.

## 2020-03-24 NOTE — Telephone Encounter (Signed)
According to my note of 08 Dec 2019, she was placed on Trelegy.  However, subsequent note showed that Trelegy was denied even with prior off.  She was then placed on Advair 250/50 1 puff twice a day and Spiriva 2 inhalations daily.  Would be happy to present another PA for Trelegy.

## 2020-03-28 NOTE — Telephone Encounter (Signed)
To my knowledge we never received fax from pharmacy for [place]. Went ahead and started one on CMM it has been sent to plan. Will await response  Key: [id number]

## 2020-03-29 NOTE — Telephone Encounter (Signed)
Per CMM- PA for trelegy has been approved.  [facility] is aware of approval. Nothing further is needed at this time.

## 2020-05-15 ENCOUNTER — Other Ambulatory Visit: Payer: Self-pay | Admitting: Pain Medicine

## 2020-05-15 ENCOUNTER — Other Ambulatory Visit: Payer: Self-pay

## 2020-05-15 ENCOUNTER — Encounter: Payer: Self-pay | Admitting: Pain Medicine

## 2020-05-15 ENCOUNTER — Ambulatory Visit: Payer: Medicaid Other | Attending: Pain Medicine | Admitting: Pain Medicine

## 2020-05-15 VITALS — BP 134/106 | HR 87 | Temp 97.2°F | Ht 65.0 in | Wt 249.0 lb

## 2020-05-15 DIAGNOSIS — M8949 Other hypertrophic osteoarthropathy, multiple sites: Secondary | ICD-10-CM

## 2020-05-15 DIAGNOSIS — M533 Sacrococcygeal disorders, not elsewhere classified: Secondary | ICD-10-CM | POA: Diagnosis present

## 2020-05-15 DIAGNOSIS — M7061 Trochanteric bursitis, right hip: Secondary | ICD-10-CM | POA: Diagnosis present

## 2020-05-15 DIAGNOSIS — M25512 Pain in left shoulder: Secondary | ICD-10-CM | POA: Diagnosis present

## 2020-05-15 DIAGNOSIS — M159 Polyosteoarthritis, unspecified: Secondary | ICD-10-CM

## 2020-05-15 DIAGNOSIS — M62830 Muscle spasm of back: Secondary | ICD-10-CM | POA: Diagnosis present

## 2020-05-15 DIAGNOSIS — G8929 Other chronic pain: Secondary | ICD-10-CM | POA: Diagnosis present

## 2020-05-15 DIAGNOSIS — G894 Chronic pain syndrome: Secondary | ICD-10-CM | POA: Diagnosis not present

## 2020-05-15 DIAGNOSIS — M25551 Pain in right hip: Secondary | ICD-10-CM | POA: Diagnosis present

## 2020-05-15 DIAGNOSIS — M5441 Lumbago with sciatica, right side: Secondary | ICD-10-CM | POA: Diagnosis not present

## 2020-05-15 DIAGNOSIS — M25511 Pain in right shoulder: Secondary | ICD-10-CM | POA: Insufficient documentation

## 2020-05-15 DIAGNOSIS — M4722 Other spondylosis with radiculopathy, cervical region: Secondary | ICD-10-CM | POA: Insufficient documentation

## 2020-05-15 DIAGNOSIS — M7062 Trochanteric bursitis, left hip: Secondary | ICD-10-CM

## 2020-05-15 DIAGNOSIS — M4712 Other spondylosis with myelopathy, cervical region: Secondary | ICD-10-CM | POA: Diagnosis present

## 2020-05-15 DIAGNOSIS — Z79899 Other long term (current) drug therapy: Secondary | ICD-10-CM | POA: Diagnosis present

## 2020-05-15 DIAGNOSIS — M7918 Myalgia, other site: Secondary | ICD-10-CM | POA: Diagnosis present

## 2020-05-15 DIAGNOSIS — M25552 Pain in left hip: Secondary | ICD-10-CM | POA: Diagnosis present

## 2020-05-15 MED ORDER — TIZANIDINE HCL 4 MG PO TABS: 4.0000 mg | ORAL_TABLET | Freq: Three times a day (TID) | ORAL | 2 refills | Status: DC

## 2020-05-15 MED ORDER — OXYCODONE HCL 10 MG PO TABS: 10.0000 mg | ORAL_TABLET | Freq: Four times a day (QID) | ORAL | 0 refills | Status: DC | PRN

## 2020-05-15 MED ORDER — DICLOFENAC SODIUM 1 % EX GEL: 4.0000 g | Freq: Four times a day (QID) | CUTANEOUS | 3 refills | Status: DC

## 2020-05-15 NOTE — Patient Instructions (Addendum)
____________________________________________________________________________________________  Preparing for Procedure with Sedation  Procedure appointments are limited to planned procedures:  No Prescription Refills.  No disability issues will be discussed.  No medication changes will be discussed.  Instructions:  Oral Intake: Do not eat or drink anything for at least 8 hours prior to your procedure. (Exception: Blood Pressure Medication. See below.)  Transportation: Unless otherwise stated by your physician, you may drive yourself after the procedure.  Blood Pressure Medicine: Do not forget to take your blood pressure medicine with a sip of water the morning of the procedure. If your Diastolic (lower reading)is above 100 mmHg, elective cases will be cancelled/rescheduled.  Blood thinners: These will need to be stopped for procedures. Notify our staff if you are taking any blood thinners. Depending on which one you take, there will be specific instructions on how and when to stop it.  Diabetics on insulin: Notify the staff so that you can be scheduled 1st case in the morning. If your diabetes requires high dose insulin, take only  of your normal insulin dose the morning of the procedure and notify the staff that you have done so.  Preventing infections: Shower with an antibacterial soap the morning of your procedure.  Build-up your immune system: Take 1000 mg of Vitamin C with every meal (3 times a day) the day prior to your procedure.  Antibiotics: Inform the staff if you have a condition or reason that requires you to take antibiotics before dental procedures.  Pregnancy: If you are pregnant, call and cancel the procedure.  Sickness: If you have a cold, fever, or any active infections, call and cancel the procedure.  Arrival: You must be in the facility at least 30 minutes prior to your scheduled procedure.  Children: Do not bring children with you.  Dress appropriately:  Bring dark clothing that you would not mind if they get stained.  Valuables: Do not bring any jewelry or valuables.  Reasons to call and reschedule or cancel your procedure: (Following these recommendations will minimize the risk of a serious complication.)  Surgeries: Avoid having procedures within 2 weeks of any surgery. (Avoid for 2 weeks before or after any surgery).  Flu Shots: Avoid having procedures within 2 weeks of a flu shots or . (Avoid for 2 weeks before or after immunizations).  Barium: Avoid having a procedure within 7-10 days after having had a radiological study involving the use of radiological contrast. (Myelograms, Barium swallow or enema study).  Heart attacks: Avoid any elective procedures or surgeries for the initial 6 months after a "Myocardial Infarction" (Heart Attack).  Blood thinners: It is imperative that you stop these medications before procedures. Let us know if you if you take any blood thinner.   Infection: Avoid procedures during or within two weeks of an infection (including chest colds or gastrointestinal problems). Symptoms associated with infections include: Localized redness, fever, chills, night sweats or profuse sweating, burning sensation when voiding, cough, congestion, stuffiness, runny nose, sore throat, diarrhea, nausea, vomiting, cold or Flu symptoms, recent or current infections. It is specially important if the infection is over the area that we intend to treat.  Heart and lung problems: Symptoms that may suggest an active cardiopulmonary problem include: cough, chest pain, breathing difficulties or shortness of breath, dizziness, ankle swelling, uncontrolled high or unusually low blood pressure, and/or palpitations. If you are experiencing any of these symptoms, cancel your procedure and contact your primary care physician for an evaluation.  Remember:  Regular Business hours are:  Monday to Thursday 8:00 AM to 4:00 PM  Provider's  Schedule: Milinda Pointer, MD:  Procedure days: Tuesday and Thursday 7:30 AM to 4:00 PM  Gillis Santa, MD:  Procedure days: Monday and Wednesday 7:30 AM to 4:00 PM ____________________________________________________________________________________________  ____________________________________________________________________________________________  CBD (cannabidiol) WARNING  Applicable to: All individuals currently taking or considering taking CBD (cannabidiol) and, more important, all patients taking opioid analgesic controlled substances (pain medication). (Example: oxycodone; oxymorphone; hydrocodone; hydromorphone; morphine; methadone; tramadol; tapentadol; fentanyl; buprenorphine; butorphanol; dextromethorphan; meperidine; codeine; etc.)  Legal status: CBD remains a Schedule I drug prohibited for any use. CBD is illegal with one exception. In the [place], CBD has a limited Transport planner (FDA) approval for the treatment of two specific types of epilepsy disorders. Only one CBD product has been approved by the FDA for this purpose: "Epidiolex". FDA is aware that some companies are marketing products containing cannabis and cannabis-derived compounds in ways that violate the [employer], Drug and Cosmetic Act [facility] Act) and that may put the health and safety of consumers at risk. The FDA, a Federal agency, has not enforced the CBD status since 2018.   Legality: Some manufacturers ship CBD products nationally, which is illegal. Often such products are sold online and are therefore available throughout the country. CBD is openly sold in head shops and health food stores in some states where such sales have not been explicitly legalized. Selling unapproved products with unsubstantiated therapeutic claims is not only a violation of the law, but also can put patients at risk, as these products have not been proven to be safe or effective. Federal illegality makes it difficult to  conduct research on CBD.  Reference: "FDA Regulation of Cannabis and Cannabis-Derived Products, Including Cannabidiol (CBD)" - [employer]  Warning: CBD is not FDA approved and has not undergo the same manufacturing controls as prescription drugs.  This means that the purity and safety of available CBD may be questionable. Most of the time, despite manufacturer's claims, it is contaminated with THC (delta-9-tetrahydrocannabinol - the chemical in marijuana responsible for the "HIGH").  When this is the case, the [facility] contaminant will trigger a positive urine drug screen (UDS) test for Marijuana (carboxy-THC). Because a positive UDS for any illicit substance is a violation of our medication agreement, your opioid analgesics (pain medicine) may be permanently discontinued.  MORE ABOUT CBD  General Information: CBD  is a derivative of the Marijuana (cannabis sativa) plant discovered in 47. It is one of the 113 identified substances found in Marijuana. It accounts for up to 40% of the plant's extract. As of 2018, preliminary clinical studies on CBD included research for the treatment of anxiety, movement disorders, and pain. CBD is available and consumed in multiple forms, including inhalation of smoke or vapor, as an aerosol spray, and by mouth. It may be supplied as an oil containing CBD, capsules, dried cannabis, or as a liquid solution. CBD is thought not to be as psychoactive as THC (delta-9-tetrahydrocannabinol - the chemical in marijuana responsible for the "HIGH"). Studies suggest that CBD may interact with different biological target receptors in the body, including cannabinoid and other neurotransmitter receptors. As of 2018 the mechanism of action for its biological effects has not been determined.  Side-effects   Adverse reactions: Dry mouth, diarrhea, decreased appetite, fatigue,  drowsiness, malaise, weakness, sleep disturbances, and others.  Drug interactions: CBC may interact with other medications such as blood-thinners. (Last update: 02/19/2020) ____________________________________________________________________________________________   ____________________________________________________________________________________________  Medication Rules  Purpose:  To inform patients, and their family members, of our rules and regulations.  Applies to: All patients receiving prescriptions (written or electronic).  Pharmacy of record: Pharmacy where electronic prescriptions will be sent. If written prescriptions are taken to a different pharmacy, please inform the nursing staff. The pharmacy listed in the electronic medical record should be the one where you would like electronic prescriptions to be sent.  Electronic prescriptions: In compliance with the [place] (STOP) Act of 2017 (Session Lanny Cramp [phone]), effective July 15, 2018, all controlled substances must be electronically prescribed. Calling prescriptions to the pharmacy will cease to exist.  Prescription refills: Only during scheduled appointments. Applies to all prescriptions.  NOTE: The following applies primarily to controlled substances (Opioid* Pain Medications).   Type of encounter (visit): For patients receiving controlled substances, face-to-face visits are required. (Not an option or up to the patient.)  Patient's responsibilities: 1. Pain Pills: Bring all pain pills to every appointment (except for procedure appointments). 2. Pill Bottles: Bring pills in original pharmacy bottle. Always bring the newest bottle. Bring bottle, even if empty. 3. Medication refills: You are responsible for knowing and keeping track of what medications you take and those you need refilled. The day before your appointment: write a list of all prescriptions that need to be  refilled. The day of the appointment: give the list to the admitting nurse. Prescriptions will be written only during appointments. No prescriptions will be written on procedure days. If you forget a medication: it will not be "Called in", "Faxed", or "electronically sent". You will need to get another appointment to get these prescribed. No early refills. Do not call asking to have your prescription filled early. 4. Prescription Accuracy: You are responsible for carefully inspecting your prescriptions before leaving our office. Have the discharge nurse carefully go over each prescription with you, before taking them home. Make sure that your name is accurately spelled, that your address is correct. Check the name and dose of your medication to make sure it is accurate. Check the number of pills, and the written instructions to make sure they are clear and accurate. Make sure that you are given enough medication to last until your next medication refill appointment. 5. Taking Medication: Take medication as prescribed. When it comes to controlled substances, taking less pills or less frequently than prescribed is permitted and encouraged. Never take more pills than instructed. Never take medication more frequently than prescribed.  6. Inform other Doctors: Always inform, all of your healthcare providers, of all the medications you take. 7. Pain Medication from other Providers: You are not allowed to accept any additional pain medication from any other Doctor or Healthcare provider. There are two exceptions to this rule. (see below) In the event that you require additional pain medication, you are responsible for notifying us, as stated below. 8. Medication Agreement: You are responsible for carefully reading and following our Medication Agreement. This must be signed before receiving any prescriptions from our practice. Safely store a copy of your signed Agreement. Violations to the Agreement will result in  no further prescriptions. (Additional copies of our Medication Agreement are available upon request.) 9. Laws, Rules, & Regulations: All patients are expected to follow all Federal and [employer], [employer], Rules, [employer]. Ignorance of the Laws does not constitute a valid excuse.  10. Illegal drugs and Controlled Substances: The use of illegal substances (including, but not limited to marijuana and its derivatives) and/or the illegal use of  any controlled substances is strictly prohibited. Violation of this rule may result in the immediate and permanent discontinuation of any and all prescriptions being written by our practice. The use of any illegal substances is prohibited. 11. Adopted CDC guidelines & recommendations: Target dosing levels will be at or below 60 MME/day. Use of benzodiazepines** is not recommended.  Exceptions: There are only two exceptions to the rule of not receiving pain medications from other Healthcare Providers. 1. Exception #1 (Emergencies): In the event of an emergency (i.e.: accident requiring emergency care), you are allowed to receive additional pain medication. However, you are responsible for: As soon as you are able, call our office [phone], at any time of the day or night, and leave a message stating your name, the date and nature of the emergency, and the name and dose of the medication prescribed. In the event that your call is answered by a member of our staff, make sure to document and save the date, time, and the name of the person that took your information.  2. Exception #2 (Planned Surgery): In the event that you are scheduled by another doctor or dentist to have any type of surgery or procedure, you are allowed (for a period no longer than 30 days), to receive additional pain medication, for the acute post-op pain. However, in this case, you are responsible for picking up a copy of our "Post-op Pain Management for Surgeons" handout, and giving it to your  surgeon or dentist. This document is available at our office, and does not require an appointment to obtain it. Simply go to our office during business hours (Monday-Thursday from 8:00 AM to 4:00 PM) (Friday 8:00 AM to 12:00 Noon) or if you have a scheduled appointment with [place], prior to your surgery, and ask for it by name. In addition, you are responsible for: calling our office [phone], at any time of the day or night, and leaving a message stating your name, name of your surgeon, type of surgery, and date of procedure or surgery. Failure to comply with your responsibilities may result in termination of therapy involving the controlled substances.  *Opioid medications include: morphine, codeine, oxycodone, oxymorphone, hydrocodone, hydromorphone, meperidine, tramadol, tapentadol, buprenorphine, fentanyl, methadone. **Benzodiazepine medications include: diazepam (Valium), alprazolam (Xanax), clonazepam (Klonopine), lorazepam (Ativan), clorazepate (Tranxene), chlordiazepoxide (Librium), estazolam (Prosom), oxazepam (Serax), temazepam (Restoril), triazolam (Halcion) (Last updated: 03/21/2020) ____________________________________________________________________________________________   ____________________________________________________________________________________________  Medication Recommendations and Reminders  Applies to: All patients receiving prescriptions (written and/or electronic).  Medication Rules & Regulations: These rules and regulations exist for your safety and that of others. They are not flexible and neither are we. Dismissing or ignoring them will be considered "non-compliance" with medication therapy, resulting in complete and irreversible termination of such therapy. (See document titled "Medication Rules" for more details.) In all conscience, because of safety reasons, we cannot continue providing a therapy where the patient does not follow instructions.  Pharmacy of  record:   Definition: This is the pharmacy where your electronic prescriptions will be sent.   We do not endorse any particular pharmacy, however, we have experienced problems with [facility] not securing enough medication supply for the community.  We do not restrict you in your choice of pharmacy. However, once we write for your prescriptions, we will NOT be re-sending more prescriptions to fix restricted supply problems created by your pharmacy, or your insurance.   The pharmacy listed in the electronic medical record should be the one where you want electronic prescriptions  to be sent.  If you choose to change pharmacy, simply notify our nursing staff.  Recommendations:  Keep all of your pain medications in a safe place, under lock and key, even if you live alone. We will NOT replace lost, stolen, or damaged medication.  After you fill your prescription, take 1 week's worth of pills and put them away in a safe place. You should keep a separate, properly labeled bottle for this purpose. The remainder should be kept in the original bottle. Use this as your primary supply, until it runs out. Once it's gone, then you know that you have 1 week's worth of medicine, and it is time to come in for a prescription refill. If you do this correctly, it is unlikely that you will ever run out of medicine.  To make sure that the above recommendation works, it is very important that you make sure your medication refill appointments are scheduled at least 1 week before you run out of medicine. To do this in an effective manner, make sure that you do not leave the office without scheduling your next medication management appointment. Always ask the nursing staff to show you in your prescription , when your medication will be running out. Then arrange for the receptionist to get you a return appointment, at least 7 days before you run out of medicine. Do not wait until you have 1 or 2 pills left, to come in. This is  very poor planning and does not take into consideration that we may need to cancel appointments due to bad weather, sickness, or emergencies affecting our staff.  DO NOT ACCEPT A "Partial Fill": If for any reason your pharmacy does not have enough pills/tablets to completely fill or refill your prescription, do not allow for a "partial fill". The law allows the pharmacy to complete that prescription within 72 hours, without requiring a new prescription. If they do not fill the rest of your prescription within those 72 hours, you will need a separate prescription to fill the remaining amount, which we will NOT provide. If the reason for the partial fill is your insurance, you will need to talk to the pharmacist about payment alternatives for the remaining tablets, but again, DO NOT ACCEPT A PARTIAL FILL, unless you can trust your pharmacist to obtain the remainder of the pills within 72 hours.  Prescription refills and/or changes in medication(s):   Prescription refills, and/or changes in dose or medication, will be conducted only during scheduled medication management appointments. (Applies to both, written and electronic prescriptions.)  No refills on procedure days. No medication will be changed or started on procedure days. No changes, adjustments, and/or refills will be conducted on a procedure day. Doing so will interfere with the diagnostic portion of the procedure.  No phone refills. No medications will be "called into the pharmacy".  No Fax refills.  No weekend refills.  No Holliday refills.  No after hours refills.  Remember:  Business hours are:  Monday to Thursday 8:00 AM to 4:00 PM Provider's Schedule: Milinda Pointer, MD - Appointments are:  Medication management: Monday and Wednesday 8:00 AM to 4:00 PM Procedure day: Tuesday and Thursday 7:30 AM to 4:00 PM Gillis Santa, MD - Appointments are:  Medication management: Tuesday and Thursday 8:00 AM to 4:00 PM Procedure day:  Monday and Wednesday 7:30 AM to 4:00 PM (Last update: 02/02/2020) ____________________________________________________________________________________________

## 2020-05-15 NOTE — Progress Notes (Signed)
Nursing Pain Medication Assessment:  Safety precautions to be maintained throughout the outpatient stay will include: orient to surroundings, keep bed in low position, maintain call bell within reach at all times, provide assistance with transfer out of bed and ambulation.  Medication Inspection Compliance: Pill count conducted under aseptic conditions, in front of the patient. Neither the pills nor the bottle was removed from the patient's sight at any time. Once count was completed pills were immediately returned to the patient in their original bottle.  Medication: Oxycodone IR Pill/Patch Count: 4 of 120 pills remain Pill/Patch Appearance: Markings consistent with prescribed medication Bottle Appearance: Standard pharmacy container. Clearly labeled. Filled Date: 10 / 2 / 21 Last Medication intake:  TodaySafety precautions to be maintained throughout the outpatient stay will include: orient to surroundings, keep bed in low position, maintain call bell within reach at all times, provide assistance with transfer out of bed and ambulation.

## 2020-05-15 NOTE — Progress Notes (Signed)
PROVIDER NOTE: Information contained herein reflects review and annotations entered in association with encounter. Interpretation of such information and data should be left to medically-trained personnel. Information provided to patient can be located elsewhere in the medical record under "Patient Instructions". Document created using STT-dictation technology, any transcriptional errors that may result from process are unintentional.    Patient: Danielle [name]  Service Category: E/M  Provider: Gaspar Cola, MD  DOB: [birth year]  DOS: 05/15/2020  Specialty: Interventional Pain Management  MRN: [id number]  Setting: Ambulatory outpatient  PCP: Danae Orleans, MD  Type: Established Patient    Referring Provider: Care, [place] [facility]  Location: Office  Delivery: Face-to-face     HPI  [name], a 59 y.o. year old female, is here today because of her Chronic pain syndrome [G89.4]. [name] primary complain today is Back Pain Last encounter: My last encounter with her was on 02/15/2020. Pertinent problems: [name] has Cervical spinal cord compression [facility]) (April, 2017); Cervical spinal stenosis; Groin pain; Chronic shoulder pain (1ry area of Pain) (Bilateral) (R>L); Chronic low back pain (3ry area of Pain) (Bilateral) (R>L); Lumbar facet syndrome (Bilateral) (R>L); Lumbar spondylosis; Failed back surgical syndrome; Chronic lower extremity pain (Right); Chronic neck pain (Bilateral) (R>L); Chronic cervical radicular pain (Right); Ulnar neuropathy (Left); Hx of cervical spine surgery; Cervical spondylosis; Fibromyalgia; Chronic sacroiliac pain (Bilateral) (R>L); History of total shoulder replacement (Left); Chronic pain syndrome; Complaints of weakness of lower extremity; Anterolisthesis; Cervical facet syndrome ([place]); Chronic musculoskeletal pain; Muscle spasm of back; Myofascial pain; Chronic hip pain (2ry area of Pain) (Bilateral) (R>L); 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 Pain Assessment: Severity of Chronic pain is reported as a 4 /10. Location: Back (right hip) Lower, Right/Pain radiaties down right hip. Onset: More than a month ago. Quality: Aching, Burning, Sharp, Shooting. Timing: Constant. Modifying factor(s): nothing. Vitals:  height is '5\' 5"'  (1.651 m) and weight is 249 lb (112.9 kg). Her temperature is 97.2 F (36.2 C) (abnormal). Her blood pressure is 134/106 (abnormal) and her pulse is 87. Her oxygen saturation is 98%.   Reason for encounter: medication management.  The patient indicates doing well with the current medication regimen. No adverse reactions or side effects reported to the medications.  The patient also comes in today with a flareup of her right hip and buttocks pain.  Provocative physical exam today was positive for bilateral lumbar facet arthralgia on provocative hyperextension and rotation maneuvers and Kemp maneuver.  She also had positive bilateral sacroiliac joint arthralgia and positive bilateral hip joint arthralgia on the Patrick maneuver and FABER maneuver.  However he was the right hip arthralgia on the Patrick maneuver that identically reproduce the patient's location of the pain in the buttocks area and over the trochanteric bursa.  In view of this, today we will be scheduling the patient to return for a right trochanteric bursa and hip joint injection under fluoroscopic guidance and IV sedation.  I will also be renewing her x-rays of the hips, SI joint, and lumbar  spine on flexion extension.  The last time that she had some x-rays of that area done was in 2018.  The plan was shared with the patient who understood and accepted.  RTCB: 08/14/2020 Nonopioids transferred 05/15/2020: Voltaren 1% gel, and Zanaflex 4 mg tablet  Pharmacotherapy Assessment   Analgesic: Oxycodone IR 10 mg, 1 tab PO q 6 hrs (40 mg/dayof oxycodone) MME/day:31m/day.   Monitoring: [place] PMP: PDMP reviewed during this encounter.       Pharmacotherapy: No side-effects or adverse reactions reported. Compliance: No problems identified. Effectiveness: Clinically acceptable.  BChauncey Fischer RN  05/15/2020  3:10 PM  Sign when Signing Visit Nursing Pain Medication Assessment:  Safety precautions to be maintained throughout the outpatient stay will include: orient to surroundings, keep bed in low position, maintain call bell within reach at all times, provide assistance with transfer out of bed and ambulation.  Medication Inspection Compliance: Pill count conducted under aseptic conditions, in front of the patient. Neither the pills nor the bottle was removed from the patient's sight at any time. Once count was completed pills were immediately returned to the patient in their original bottle.  Medication: Oxycodone IR Pill/Patch Count: 4 of 120 pills remain Pill/Patch Appearance: Markings consistent with prescribed medication Bottle Appearance: Standard pharmacy container. Clearly labeled. Filled Date: 10 / 2 / 21 Last Medication intake:  TodaySafety precautions to be maintained throughout the outpatient stay will include: orient to surroundings, keep bed in low position, maintain call bell within reach at all times, provide assistance with transfer out of bed and ambulation.     UDS:  Summary  Date Value Ref Range Status  01/13/2020 Note  Final    Comment:    ==================================================================== [employer] [facility] 13  (MW) ==================================================================== Test                             Result       Flag       Units  Drug Present and Declared for Prescription Verification   Oxycodone                      >4587        EXPECTED   ng/mg creat   Oxymorphone                    319          EXPECTED   ng/mg creat   Noroxycodone                   >4587        EXPECTED   ng/mg creat   Noroxymorphone                 79           EXPECTED   ng/mg creat    Sources of oxycodone are scheduled prescription medications.    Oxymorphone, noroxycodone, and noroxymorphone are expected    metabolites of oxycodone. Oxymorphone is also available as a    scheduled prescription medication.  ==================================================================== Test                      Result    Flag   Units      Ref Range   Creatinine              218              mg/dL      >=20 ==================================================================== Declared Medications:  The flagging and interpretation on this report are  based on the  following declared medications.  Unexpected results may arise from  inaccuracies in the declared medications.   **Note: The testing scope of this panel includes these medications:   Oxycodone   **Note: The testing scope of this panel does not include the  following reported medications:   Albuterol (Ventolin HFA)  Citalopram (Celexa)  Diclofenac (Voltaren)  Docusate (Colace)  Fexofenadine (Allegra-D)  Fluticasone (Advair)  Guaifenesin (Mucinex)  Levothyroxine (Synthroid)  Magnesium (Milk of Magnesia)  Naloxone (Narcan)  Nystatin (Nystop)  Pseudoephedrine (Allegra-D)  Salmeterol (Advair)  Tiotropium (Spiriva)  Tizanidine (Zanaflex)  Varenicline (Chantix)  Vitamin D ==================================================================== For clinical consultation, please call [phone]. ====================================================================      ROS  Constitutional: Denies any fever or chills Gastrointestinal: No reported hemesis, hematochezia, vomiting, or acute GI distress Musculoskeletal: Denies any acute onset joint swelling, redness, loss of ROM, or weakness Neurological: No reported episodes of acute onset apraxia, aphasia, dysarthria, agnosia, amnesia, paralysis, loss of coordination, or loss of consciousness  Medication Review  Fluticasone-Salmeterol, Fluticasone-Umeclidin-Vilant, Oxycodone HCl, albuterol, cholecalciferol, citalopram, diclofenac Sodium, docusate sodium, fexofenadine-pseudoephedrine, guaiFENesin, levothyroxine, magnesium hydroxide, naloxone, nystatin, and tiZANidine  History Review  Allergy: Ms. Ozier is allergic to aripiprazole, duloxetine, gabapentin, gold, iodinated diagnostic agents, naproxen sodium, nortriptyline hcl, nsaids, pregabalin, meperidine, ace inhibitors, aspirin, cefpodoxime, fluoxetine, fluticasone-salmeterol, levofloxacin, lithium, paroxetine, tape, telithromycin, theophylline, topiramate, trazodone, triamcinolone, venlafaxine, bupropion, ketorolac tromethamine, morphine, and moxifloxacin. Drug: Ms. Baldridge  reports no history of drug use. Alcohol:  reports no history of alcohol use. Tobacco:  reports that she has been smoking cigarettes. She has a 80.00 pack-year smoking history. She has never used smokeless tobacco. Social: Ms. Mohl  reports that she has been smoking cigarettes. She has a 80.00 pack-year smoking history. She has never used smokeless tobacco. She reports that she does not drink alcohol and does not use drugs. Medical:  has a past medical history of Acute postoperative pain (01/13/2017), Anxiety, Arthritis, Asthma, Bell's palsy, Bursitis, COPD (chronic obstructive pulmonary disease) ([place]), Depression, Fibromyalgia, Heart murmur, Hepatitis C, Hiatal hernia, Hyperlipidemia, Hypertension, IBS  (irritable bowel syndrome), Insomnia, and Thyroid disease. Surgical: Ms. Erb  has a past surgical history that includes necksurgery (10/21/2014); Back surgery (2013); Foot surgery (Right); Elbow surgery (Left); Carpal tunnel release (Right); Nose surgery; Partial hysterectomy; Total shoulder replacement (Bilateral); and Colonoscopy with propofol (N/A, 02/05/2017). Family: family history includes Cancer in her mother; Gout in her mother; Heart disease in her father.  Laboratory Chemistry Profile   Renal Lab Results  Component Value Date   BUN 14 04/28/2019   CREATININE 0.98 04/28/2019   BCR 13 02/16/2019   GFRAA >60 04/28/2019   GFRNONAA >60 04/28/2019     Hepatic Lab Results  Component Value Date   AST 18 04/28/2019   ALT 16 04/28/2019   ALBUMIN 3.7 04/28/2019   ALKPHOS 80 04/28/2019     Electrolytes Lab Results  Component Value Date   NA 139 04/28/2019   K 4.1 04/28/2019   CL 105 04/28/2019   CALCIUM 9.3 04/28/2019   MG 2.1 02/16/2019     Bone Lab Results  Component Value Date   25OHVITD1 27 (L) 02/16/2019   25OHVITD2 2.0 02/16/2019   25OHVITD3 25 02/16/2019     Inflammation (CRP: Acute Phase) (ESR: Chronic Phase) Lab Results  Component Value Date   CRP 2 02/16/2019   ESRSEDRATE 23 02/16/2019       Note: Above Lab results reviewed.  Recent Imaging Review  DG PAIN CLINIC C-ARM 1-60 MIN  NO REPORT Fluoro was used, but no Radiologist interpretation will be provided.  Please refer to "NOTES" tab for provider progress note. Note: Reviewed        Physical Exam  General appearance: Well nourished, well developed, and well hydrated. In no apparent acute distress Mental status: Alert, oriented x 3 (person, place, & time)       Respiratory: No evidence of acute respiratory distress Eyes: PERLA Vitals: BP (!) 134/106   Pulse 87   Temp (!) 97.2 F (36.2 C)   Ht '5\' 5"'  (1.651 m)   Wt 249 lb (112.9 kg)   SpO2 98%   BMI 41.44 kg/m  BMI: Estimated body mass  index is 41.44 kg/m as calculated from the following:   Height as of this encounter: '5\' 5"'  (1.651 m).   Weight as of this encounter: 249 lb (112.9 kg). Ideal: Ideal body weight: 57 kg (125 lb 10.6 oz) Adjusted ideal body weight: 79.4 kg (175 lb)  Assessment   Status Diagnosis  Controlled Controlled Controlled 1. Chronic pain syndrome   2. Chronic shoulder pain (1ry area of Pain) (Bilateral) (R>L)   3. Chronic hip pain (2ry area of Pain) (Bilateral) (R>L)   4. Chronic low back pain (3ry area of Pain) (Bilateral) (R>L)   5. Pharmacologic therapy   6. Muscle spasm of back   7. Chronic musculoskeletal pain   8. Osteoarthritis involving multiple joints   9. Chronic trochanteric bursitis (Bilateral)   10. Chronic sacroiliac pain (Bilateral) (R>L)   11. Cervical spondylosis with myelopathy and radiculopathy      Updated Problems: Problem  Cervical Spondylosis With Myelopathy and Radiculopathy    Plan of Care  Problem-specific:  No problem-specific Assessment & Plan notes found for this encounter.  Ms. MARIA COIN has a current medication list which includes the following long-term medication(s): albuterol, citalopram, fluticasone-salmeterol, levothyroxine, diclofenac sodium, [START ON 05/16/2020] oxycodone hcl, [START ON 06/15/2020] oxycodone hcl, [START ON 07/15/2020] oxycodone hcl, and tizanidine.  Pharmacotherapy (Medications Ordered): Meds ordered this encounter  Medications  . tiZANidine (ZANAFLEX) 4 MG tablet    Sig: Take 1 tablet (4 mg total) by mouth 3 (three) times daily. Max: 3/day    Dispense:  90 tablet    Refill:  2    Fill one day early if pharmacy is closed on scheduled refill date. May substitute for generic, or similar, if available. Void any older refills or prescriptions of this medication.  . Oxycodone HCl 10 MG TABS    Sig: Take 1 tablet (10 mg total) by mouth every 6 (six) hours as needed. Must last 30 days    Dispense:  120 tablet    Refill:  0     Chronic Pain: STOP Act (Not applicable) Fill 1 day early if closed on refill date. Avoid benzodiazepines within 8 hours of opioids  . diclofenac Sodium (VOLTAREN) 1 % GEL    Sig: Apply 4 g topically 4 (four) times daily.    Dispense:  350 g    Refill:  3    Fill one day early if pharmacy is closed on scheduled refill date. May substitute for generic, or similar, if available. Void any older refills or prescriptions of this medication.  . Oxycodone HCl 10 MG TABS    Sig: Take 1 tablet (10 mg total) by mouth every 6 (six) hours as needed. Must last 30 days    Dispense:  120 tablet    Refill:  0    Chronic Pain:  STOP Act (Not applicable) Fill 1 day early if closed on refill date. Avoid benzodiazepines within 8 hours of opioids  . Oxycodone HCl 10 MG TABS    Sig: Take 1 tablet (10 mg total) by mouth every 6 (six) hours as needed. Must last 30 days    Dispense:  120 tablet    Refill:  0    Chronic Pain: STOP Act (Not applicable) Fill 1 day early if closed on refill date. Avoid benzodiazepines within 8 hours of opioids   Orders:  Orders Placed This Encounter  Procedures  . DG HIP UNILAT W OR W/O PELVIS 2-3 VIEWS RIGHT    Please describe any evidence of DJD, such as joint narrowing, asymmetry, cysts, or any anomalies in bone density, production, or erosion.    Standing Status:   Future    Standing Expiration Date:   06/14/2020    Scheduling Instructions:     Imaging must be done as soon as possible. Inform patient that order will expire within 30 days and I will not renew it.    Order Specific Question:   Reason for Exam (SYMPTOM  OR DIAGNOSIS REQUIRED)    Answer:   Right hip pain/arthralgia    Order Specific Question:   Is the patient pregnant?    Answer:   No    Order Specific Question:   Preferred imaging location?    Answer:   [facility]    Order Specific Question:   Call Results- Best Contact Number?    Answer:   [phone] ([facility])    Order Specific Question:    Release to patient    Answer:   Immediate  . DG Lumbar Spine Complete W/Bend    Patient presents with axial pain with possible radicular component.  In addition to any acute findings, please report on:  1. Facet (Zygapophyseal) joint DJD (Hypertrophy, space narrowing, subchondral sclerosis, and/or osteophyte formation) 2. DDD and/or IVDD (Loss of disc height, desiccation or "Black disc disease") 3. Pars defects 4. Spondylolisthesis, spondylosis, and/or spondyloarthropathies (include Degree/Grade of displacement in mm) 5. Vertebral body Fractures, including age (old, new/acute) 64. Modic Type Changes 7. Demineralization 8. Bone pathology 9. Central, Lateral Recess, and/or Foraminal Stenosis (include AP diameter of stenosis in mm) 10. Surgical changes (hardware type, status, and presence of fibrosis)  NOTE: Please specify level(s) and laterality. If applicable: Please indicate ROM and/or evidence of instability (>48m displacement between flexion and extension views)    Standing Status:   Future    Standing Expiration Date:   06/14/2020    Scheduling Instructions:     Imaging must be done as soon as possible. Inform patient that order will expire within 30 days and I will not renew it.    Order Specific Question:   Reason for Exam (SYMPTOM  OR DIAGNOSIS REQUIRED)    Answer:   Low back pain    Order Specific Question:   Is patient pregnant?    Answer:   No    Order Specific Question:   Preferred imaging location?    Answer:   [facility]    Order Specific Question:   Call Results- Best Contact Number?    Answer:   [phone][facility]    Order Specific Question:   Radiology Contrast Protocol - do NOT remove file path    Answer:   [email or web address]    Order Specific Question:   Release to patient    Answer:   Immediate  . DG  Si Joints    Standing Status:   Future    Standing Expiration Date:   06/14/2020    Scheduling  Instructions:     Imaging must be done as soon as possible. Inform patient that order will expire within 30 days and I will not renew it.    Order Specific Question:   Reason for Exam (SYMPTOM  OR DIAGNOSIS REQUIRED)    Answer:   Right hip pain/arthralgia    Order Specific Question:   Is the patient pregnant?    Answer:   No    Order Specific Question:   Preferred imaging location?    Answer:   [facility]    Order Specific Question:   Call Results- Best Contact Number?    Answer:   [phone] ([facility])    Order Specific Question:   Release to patient    Answer:   Immediate   Follow-up plan:   Return for Procedure (w/ sedation): (R) IA Hip inj., ASAP.      Considering:  Possible bilateral hip jointRFA Diagnostic caudal ESI + epidurogram  Possible Racz procedure.  Possible bilateral cervical facetRFA Diagnostic right-sided CESI  Diagnostic left sacroiliac joint block Possible bilateral sacroiliac joint RFA   Palliative PRN treatment(s): Palliative bilateral trochanteric bursa #2  Palliative bilateralsuprascapular nerve block #3 Palliative bilateral suprascapular nerveRFA #2(last done on 05/13/2019) Diagnostic bilateral IA hip joint injection #3 (last done 04/30/2018)  Diagnostic bilateral lumbar facet block #4 Palliative right lumbar facet RFA #3(last done 12/16/2017)  Palliative left lumbar facet RFA #3(last done 02/12/2018)  Diagnostic bilateral cervical facet block #2 Diagnostic right SI joint block #2      Recent Visits Date Type Provider Dept  02/16/20 Telemedicine Milinda Pointer, MD [facility]  Showing recent visits within past 90 days and meeting all other requirements Today's Visits Date Type Provider Dept  05/15/20 Office Visit Milinda Pointer, MD [facility]  Showing today's visits and meeting all other requirements Future Appointments Date Type Provider Dept  08/09/20 Appointment Milinda Pointer, MD [facility]  Showing future appointments within next 90 days and meeting all other requirements  I discussed the assessment and treatment plan with the patient. The patient was provided an opportunity to ask questions and all were answered. The patient agreed with the plan and demonstrated an understanding of the instructions.  Patient advised to call back or seek an in-person evaluation if the symptoms or condition worsens.  Duration of encounter: 30 minutes.  Note by: Gaspar Cola, MD Date: 05/15/2020; Time: 4:05 PM

## 2020-05-16 ENCOUNTER — Telehealth: Payer: Self-pay

## 2020-05-16 NOTE — Telephone Encounter (Signed)
Pharmacy states that her insurance, medicaid, is only allowing a 5 day supply and they dont know why.

## 2020-05-16 NOTE — Telephone Encounter (Signed)
Sent PA via [facility] for oxycodone 10 mg  key [id number].  Called patient and left voicemail that this had been done.

## 2020-05-18 ENCOUNTER — Other Ambulatory Visit: Payer: Self-pay

## 2020-05-23 ENCOUNTER — Ambulatory Visit
Admission: RE | Admit: 2020-05-23 | Discharge: 2020-05-23 | Disposition: A | Discharge status: Home / Self Care | Payer: Medicaid Other | Source: Ambulatory Visit | Attending: Pain Medicine | Admitting: Pain Medicine

## 2020-05-23 ENCOUNTER — Other Ambulatory Visit
Admission: RE | Admit: 2020-05-23 | Discharge: 2020-05-23 | Disposition: A | Discharge status: Home / Self Care | Payer: Medicaid Other | Source: Ambulatory Visit | Attending: Family Medicine | Admitting: Family Medicine

## 2020-05-23 DIAGNOSIS — M5441 Lumbago with sciatica, right side: Secondary | ICD-10-CM | POA: Diagnosis present

## 2020-05-23 DIAGNOSIS — M7061 Trochanteric bursitis, right hip: Secondary | ICD-10-CM | POA: Diagnosis present

## 2020-05-23 DIAGNOSIS — M533 Sacrococcygeal disorders, not elsewhere classified: Secondary | ICD-10-CM | POA: Insufficient documentation

## 2020-05-23 DIAGNOSIS — M25552 Pain in left hip: Secondary | ICD-10-CM | POA: Insufficient documentation

## 2020-05-23 DIAGNOSIS — M25551 Pain in right hip: Secondary | ICD-10-CM | POA: Insufficient documentation

## 2020-05-23 DIAGNOSIS — G8929 Other chronic pain: Secondary | ICD-10-CM | POA: Diagnosis present

## 2020-05-23 DIAGNOSIS — M7062 Trochanteric bursitis, left hip: Secondary | ICD-10-CM | POA: Insufficient documentation

## 2020-06-01 ENCOUNTER — Ambulatory Visit
Admission: RE | Admit: 2020-06-01 | Discharge: 2020-06-01 | Disposition: A | Discharge status: Home / Self Care | Payer: Medicaid Other | Source: Ambulatory Visit | Attending: Pain Medicine | Admitting: Pain Medicine

## 2020-06-01 ENCOUNTER — Other Ambulatory Visit: Payer: Self-pay

## 2020-06-01 ENCOUNTER — Ambulatory Visit (HOSPITAL_BASED_OUTPATIENT_CLINIC_OR_DEPARTMENT_OTHER): Payer: Medicaid Other | Admitting: Pain Medicine

## 2020-06-01 VITALS — BP 128/80 | HR 79 | Temp 97.2°F | Resp 14 | Ht 65.0 in | Wt 249.0 lb

## 2020-06-01 DIAGNOSIS — Z87892 Personal history of anaphylaxis: Secondary | ICD-10-CM | POA: Insufficient documentation

## 2020-06-01 DIAGNOSIS — Z91041 Radiographic dye allergy status: Secondary | ICD-10-CM

## 2020-06-01 DIAGNOSIS — M1611 Unilateral primary osteoarthritis, right hip: Secondary | ICD-10-CM | POA: Diagnosis present

## 2020-06-01 DIAGNOSIS — M25551 Pain in right hip: Secondary | ICD-10-CM | POA: Diagnosis not present

## 2020-06-01 DIAGNOSIS — G8929 Other chronic pain: Secondary | ICD-10-CM | POA: Diagnosis not present

## 2020-06-01 MED ORDER — LIDOCAINE HCL 2 % IJ SOLN
INTRAMUSCULAR | Status: AC
  Filled 2020-06-01: qty 20

## 2020-06-01 MED ORDER — FENTANYL CITRATE (PF) 100 MCG/2ML IJ SOLN
25.0000 ug | INTRAMUSCULAR | Status: DC | PRN
  Administered 2020-06-01: 50 ug via INTRAVENOUS

## 2020-06-01 MED ORDER — MIDAZOLAM HCL 5 MG/5ML IJ SOLN
1.0000 mg | INTRAMUSCULAR | Status: DC | PRN
  Administered 2020-06-01: 2 mg via INTRAVENOUS

## 2020-06-01 MED ORDER — ROPIVACAINE HCL 2 MG/ML IJ SOLN
9.0000 mL | Freq: Once | INTRAMUSCULAR | Status: AC
  Administered 2020-06-01: 9 mL via INTRA_ARTICULAR

## 2020-06-01 MED ORDER — METHYLPREDNISOLONE ACETATE 80 MG/ML IJ SUSP
80.0000 mg | Freq: Once | INTRAMUSCULAR | Status: AC
  Administered 2020-06-01: 80 mg via INTRA_ARTICULAR

## 2020-06-01 MED ORDER — FENTANYL CITRATE (PF) 100 MCG/2ML IJ SOLN
INTRAMUSCULAR | Status: AC
  Filled 2020-06-01: qty 2

## 2020-06-01 MED ORDER — MIDAZOLAM HCL 5 MG/5ML IJ SOLN
INTRAMUSCULAR | Status: AC
  Filled 2020-06-01: qty 5

## 2020-06-01 MED ORDER — LACTATED RINGERS IV SOLN: 1000.0000 mL | Freq: Once | INTRAVENOUS | Status: DC

## 2020-06-01 MED ORDER — METHYLPREDNISOLONE ACETATE 80 MG/ML IJ SUSP
INTRAMUSCULAR | Status: AC
  Filled 2020-06-01: qty 1

## 2020-06-01 MED ORDER — ROPIVACAINE HCL 2 MG/ML IJ SOLN
INTRAMUSCULAR | Status: AC
  Filled 2020-06-01: qty 10

## 2020-06-01 MED ORDER — LIDOCAINE HCL 2 % IJ SOLN
20.0000 mL | Freq: Once | INTRAMUSCULAR | Status: AC
  Administered 2020-06-01: 400 mg

## 2020-06-01 NOTE — Progress Notes (Signed)
PROVIDER NOTE: Information contained herein reflects review and annotations entered in association with encounter. Interpretation of such information and data should be left to medically-trained personnel. Information provided to patient can be located elsewhere in the medical record under "Patient Instructions". Document created using STT-dictation technology, any transcriptional errors that may result from process are unintentional.    Patient: Danielle [name]  Service Category: Procedure  Provider: Gaspar Cola, MD  DOB: September 19, 1960  DOS: 06/01/2020  Location: [facility]  MRN: [id number]  Setting: Ambulatory - outpatient  Referring Provider: Danae Orleans, MD  Type: Established Patient  Specialty: Interventional Pain Management  PCP: Danae Orleans, MD   Primary Reason for Visit: Interventional Pain Management Treatment. CC: Hip Pain (right)  Procedure:          Anesthesia, Analgesia, Anxiolysis:  Type: Intra-Articular Hip Injection          Primary Purpose: Diagnostic Region: Posterolateral hip joint area. Level: Lower pelvic and hip joint level. Target Area: Superior aspect of the hip joint cavity, going thru the superior portion of the capsular ligament. Approach: Posterolateral approach. Laterality: Right  Type: Moderate (Conscious) Sedation combined with Local Anesthesia Indication(s): Analgesia and Anxiety Route: Intravenous (IV) IV Access: Secured Sedation: Meaningful verbal contact was maintained at all times during the procedure  Local Anesthetic: Lidocaine 1-2%  Position: Lateral Decubitus with bad side up Prepped Area: Entire Posterolateral hip area. DuraPrep (Iodine Povacrylex [0.7% available iodine] and Isopropyl Alcohol, 74% w/w)   Indications: 1. Chronic hip pain (Right)   2. Osteoarthritis of hip (Right)    History of allergy to iodine & radiographic dye    History of drug-induced anaphylaxis (Contrast Dye)    Pain Score: Pre-procedure:  3 /10 Post-procedure: 0-No pain/10   Pre-op H&P Assessment:  Ms. Mcevoy is a 59 y.o. (year old), female patient, seen today for interventional treatment. She  has a past surgical history that includes necksurgery (10/21/2014); Back surgery (2013); Foot surgery (Right); Elbow surgery (Left); Carpal tunnel release (Right); Nose surgery; Partial hysterectomy; Total shoulder replacement (Bilateral); and Colonoscopy with propofol (N/A, 02/05/2017). Ms. Cerino has a current medication list which includes the following prescription(s): albuterol, cholecalciferol, citalopram, diclofenac sodium, docusate sodium, trelegy ellipta, guaifenesin, levothyroxine, magnesium hydroxide, naloxone, nystatin, oxycodone hcl, [START ON 06/15/2020] oxycodone hcl, [START ON 07/15/2020] oxycodone hcl, tizanidine, loratadine, and olopatadine hcl, and the following Facility-Administered Medications: fentanyl, lactated ringers, and midazolam. Her primarily concern today is the Hip Pain (right)  Initial Vital Signs:  Pulse/HCG Rate: 79ECG Heart Rate: 73 Temp: (!) 97.3 F (36.3 C) Resp: 16 BP: 120/72 SpO2: 96 %  BMI: Estimated body mass index is 41.44 kg/m as calculated from the following:   Height as of this encounter: 5\' 5"  (1.651 m).   Weight as of this encounter: 249 lb (112.9 kg).  Risk Assessment: Allergies: Reviewed. She is allergic to aripiprazole, duloxetine, gabapentin, gold, iodinated diagnostic agents, naproxen sodium, nortriptyline hcl, nsaids, pregabalin, meperidine, ace inhibitors, aspirin, cefpodoxime, fluoxetine, fluticasone-salmeterol, levofloxacin, lithium, paroxetine, tape, telithromycin, theophylline, topiramate, trazodone, triamcinolone, venlafaxine, bupropion, ketorolac tromethamine, morphine, and moxifloxacin.  Allergy Precautions: None required Coagulopathies: Reviewed. None identified.  Blood-thinner therapy: None at this time Active Infection(s): Reviewed. None identified. Ms. Shafer is  afebrile  Site Confirmation: Ms. Dumire was asked to confirm the procedure and laterality before marking the site Procedure checklist: Completed Consent: Before the procedure and under the influence of no sedative(s), amnesic(s), or anxiolytics, the patient was informed of the treatment options, risks and possible complications.  To fulfill our ethical and legal obligations, as recommended by the American Medical Association's Code of Ethics, I have informed the patient of my clinical impression; the nature and purpose of the treatment or procedure; the risks, benefits, and possible complications of the intervention; the alternatives, including doing nothing; the risk(s) and benefit(s) of the alternative treatment(s) or procedure(s); and the risk(s) and benefit(s) of doing nothing. The patient was provided information about the general risks and possible complications associated with the procedure. These may include, but are not limited to: failure to achieve desired goals, infection, bleeding, organ or nerve damage, allergic reactions, paralysis, and death. In addition, the patient was informed of those risks and complications associated to the procedure, such as failure to decrease pain; infection; bleeding; organ or nerve damage with subsequent damage to sensory, motor, and/or autonomic systems, resulting in permanent pain, numbness, and/or weakness of one or several areas of the body; allergic reactions; (i.e.: anaphylactic reaction); and/or death. Furthermore, the patient was informed of those risks and complications associated with the medications. These include, but are not limited to: allergic reactions (i.e.: anaphylactic or anaphylactoid reaction(s)); adrenal axis suppression; blood sugar elevation that in diabetics may result in ketoacidosis or comma; water retention that in patients with history of congestive heart failure may result in shortness of breath, pulmonary edema, and decompensation with  resultant heart failure; weight gain; swelling or edema; medication-induced neural toxicity; particulate matter embolism and blood vessel occlusion with resultant organ, and/or nervous system infarction; and/or aseptic necrosis of one or more joints. Finally, the patient was informed that Medicine is not an exact science; therefore, there is also the possibility of unforeseen or unpredictable risks and/or possible complications that may result in a catastrophic outcome. The patient indicated having understood very clearly. We have given the patient no guarantees and we have made no promises. Enough time was given to the patient to ask questions, all of which were answered to the patient's satisfaction. Ms. Mccasland has indicated that she wanted to continue with the procedure. Attestation: I, the ordering provider, attest that I have discussed with the patient the benefits, risks, side-effects, alternatives, likelihood of achieving goals, and potential problems during recovery for the procedure that I have provided informed consent. Date  Time: 06/01/2020  8:39 AM  Pre-Procedure Preparation:  Monitoring: As per clinic protocol. Respiration, ETCO2, SpO2, BP, heart rate and rhythm monitor placed and checked for adequate function Safety Precautions: Patient was assessed for positional comfort and pressure points before starting the procedure. Time-out: I initiated and conducted the "Time-out" before starting the procedure, as per protocol. The patient was asked to participate by confirming the accuracy of the "Time Out" information. Verification of the correct person, site, and procedure were performed and confirmed by me, the nursing staff, and the patient. "Time-out" conducted as per Joint Commission's Universal Protocol (UP.01.01.01). Time: 0950  Description of Procedure:          Safety Precautions: Aspiration looking for blood return was conducted prior to all injections. At no point did we inject any  substances, as a needle was being advanced. No attempts were made at seeking any paresthesias. Safe injection practices and needle disposal techniques used. Medications properly checked for expiration dates. SDV (single dose vial) medications used. Description of the Procedure: Protocol guidelines were followed. The patient was placed in position over the fluoroscopy table. The target area was identified and the area prepped in the usual manner. Skin & deeper tissues infiltrated with local anesthetic. Appropriate amount  of time allowed to pass for local anesthetics to take effect. The procedure needles were then advanced to the target area. Proper needle placement secured. Negative aspiration confirmed. Solution injected in intermittent fashion, asking for systemic symptoms every 0.5cc of injectate. The needles were then removed and the area cleansed, making sure to leave some of the prepping solution back to take advantage of its long term bactericidal properties. Vitals:   06/01/20 0956 06/01/20 1006 06/01/20 1016 06/01/20 1026  BP: 130/69 (!) 121/58 133/77 128/80  Pulse:      Resp: 13 15 14 14   Temp:  (!) 97.3 F (36.3 C)  (!) 97.2 F (36.2 C)  TempSrc:      SpO2: 93% 95% 96% 96%  Weight:      Height:        Start Time: 0950 hrs. End Time: 0956 hrs. Materials:  Needle(s) Type: Spinal Needle Gauge: 22G Length: 5.0-in Medication(s): Please see orders for medications and dosing details.  Imaging Guidance (Non-Spinal):          Type of Imaging Technique: Fluoroscopy Guidance (Non-Spinal) Indication(s): Assistance in needle guidance and placement for procedures requiring needle placement in or near specific anatomical locations not easily accessible without such assistance. Exposure Time: Please see nurses notes. Contrast: None used. Fluoroscopic Guidance: I was personally present during the use of fluoroscopy. "Tunnel Vision Technique" used to obtain the best possible view of the target  area. Parallax error corrected before commencing the procedure. "Direction-depth-direction" technique used to introduce the needle under continuous pulsed fluoroscopy. Once target was reached, antero-posterior, oblique, and lateral fluoroscopic projection used confirm needle placement in all planes. Images permanently stored in EMR. Interpretation: No contrast injected.  Antibiotic Prophylaxis:   Anti-infectives (From admission, onward)   None     Indication(s): None identified  Post-operative Assessment:  Post-procedure Vital Signs:  Pulse/HCG Rate: 7974 Temp: (!) 97.2 F (36.2 C) Resp: 14 BP: 128/80 SpO2: 96 %  EBL: None  Complications: No immediate post-treatment complications observed by team, or reported by patient.  Note: The patient tolerated the entire procedure well. A repeat set of vitals were taken after the procedure and the patient was kept under observation following institutional policy, for this type of procedure. Post-procedural neurological assessment was performed, showing return to baseline, prior to discharge. The patient was provided with post-procedure discharge instructions, including a section on how to identify potential problems. Should any problems arise concerning this procedure, the patient was given instructions to immediately contact us, at any time, without hesitation. In any case, we plan to contact the patient by telephone for a follow-up status report regarding this interventional procedure.  Comments:  No additional relevant information.  Plan of Care  Orders:  Orders Placed This Encounter  Procedures  . HIP INJECTION    Scheduling Instructions:     Side: Right-sided     Sedation: Patient's choice.     Timeframe: Today  . [facility] C-ARM 1-60 MIN NO REPORT    Intraoperative interpretation by procedural physician at [facility].    Standing Status:   Standing    Number of Occurrences:   1    Order Specific Question:   Reason  for exam:    Answer:   Assistance in needle guidance and placement for procedures requiring needle placement in or near specific anatomical locations not easily accessible without such assistance.  . Informed Consent Details: Physician/Practitioner Attestation; Transcribe to consent form and obtain patient signature    Nursing  Order: Transcribe to consent form and obtain patient signature. Note: Always confirm laterality of pain with Ms. Domingo Cocking, before procedure.    Order Specific Question:   Physician/Practitioner attestation of informed consent for procedure/surgical case    Answer:   I, the physician/practitioner, attest that I have discussed with the patient the benefits, risks, side effects, alternatives, likelihood of achieving goals and potential problems during recovery for the procedure that I have provided informed consent.    Order Specific Question:   Procedure    Answer:   Hip injection    Order Specific Question:   Physician/Practitioner performing the procedure    Answer:   Vonna Brabson A. [name], MD    Order Specific Question:   Indication/Reason    Answer:   Hip Joint Pain (Arthralgia)  . Provide equipment / supplies at bedside    "Block Tray" (Disposable  single use) Needle type: SpinalSpinal Amount/quantity: 1 Size: Long (7-inch) Gauge: 22G    Standing Status:   Standing    Number of Occurrences:   1    Order Specific Question:   Specify    Answer:   Block Tray  . Miscellanous precautions    Standing Status:   Standing    Number of Occurrences:   1  . Miscellanous precautions    NOTE: Although It is true that patients can have allergies to shellfish and that shellfish contain iodine, most shellfish  allergies are due to two protein allergens present in the shellfish: tropomyosins and parvalbumin. Not all patients with shellfish allergies are allergic to iodine. However, as a precaution, avoid using iodine containing products.    Standing Status:   Standing    Number of  Occurrences:   1   Chronic Opioid Analgesic:  Oxycodone IR 10 mg, 1 tab PO q 6 hrs (40 mg/dayof oxycodone) MME/day:60mg /day.   Medications ordered for procedure: Meds ordered this encounter  Medications  . lidocaine (XYLOCAINE) 2 % (with pres) injection 400 mg  . lactated ringers infusion 1,000 mL  . midazolam (VERSED) 5 MG/5ML injection 1-2 mg    Make sure Flumazenil is available in the pyxis when using this medication. If oversedation occurs, administer 0.2 mg IV over 15 sec. If after 45 sec no response, administer 0.2 mg again over 1 min; may repeat at 1 min intervals; not to exceed 4 doses (1 mg)  . fentaNYL (SUBLIMAZE) injection 25-50 mcg    Make sure Narcan is available in the pyxis when using this medication. In the event of respiratory depression (RR< 8/min): Titrate NARCAN (naloxone) in increments of 0.1 to 0.2 mg IV at 2-3 minute intervals, until desired degree of reversal.  . ropivacaine (PF) 2 mg/mL (0.2%) (NAROPIN) injection 9 mL  . methylPREDNISolone acetate (DEPO-MEDROL) injection 80 mg   Medications administered: We administered lidocaine, midazolam, fentaNYL, ropivacaine (PF) 2 mg/mL (0.2%), and methylPREDNISolone acetate.  See the medical record for exact dosing, route, and time of administration.  Follow-up plan:   Return in about 2 weeks (around 06/15/2020) for (F2F), (PP) Follow-up.       Considering: Possible bilateral hip jointRFA Diagnostic caudal ESI + epidurogram  Possible Racz procedure.  Possible bilateral cervical facetRFA Diagnostic right-sided CESI  Diagnostic left sacroiliac joint block Possible bilateral sacroiliac joint RFA   Palliative PRN treatment(s): Palliative bilateral trochanteric bursa #2  Palliative bilateralsuprascapular nerve block #3 Palliative bilateral suprascapular nerveRFA #2(last done on 05/13/2019) Diagnostic right IA hip joint injection #4 (last done 06/01/2020)  Diagnostic left IA hip joint injection #  3  (last done 04/30/2018)  Diagnostic bilateral lumbar facet block #4 Palliative right lumbar facet RFA #3(last done 12/16/2017)  Palliative left lumbar facet RFA #3(last done 02/12/2018)  Diagnostic bilateral cervical facet block #2 Diagnostic right SI joint block #2       Recent Visits Date Type Provider Dept  05/15/20 Office Visit Milinda Pointer, MD [facility]  Showing recent visits within past 90 days and meeting all other requirements Today's Visits Date Type Provider Dept  06/01/20 Procedure visit Milinda Pointer, MD [facility]  Showing today's visits and meeting all other requirements Future Appointments Date Type Provider Dept  06/21/20 Appointment Milinda Pointer, MD [facility]  08/09/20 Appointment Milinda Pointer, MD [facility]  Showing future appointments within next 90 days and meeting all other requirements  Disposition: Discharge home  Discharge (Date  Time): 06/01/2020; 1026 hrs.   Primary Care Physician: Danae Orleans, MD Location: [facility] Note by: Gaspar Cola, MD Date: 06/01/2020; Time: 1:53 PM  Disclaimer:  Medicine is not an Chief Strategy Officer. The only guarantee in medicine is that nothing is guaranteed. It is important to note that the decision to proceed with this intervention was based on the information collected from the patient. The Data and conclusions were drawn from the patient's questionnaire, the interview, and the physical examination. Because the information was provided in large part by the patient, it cannot be guaranteed that it has not been purposely or unconsciously manipulated. Every effort has been made to obtain as much relevant data as possible for this evaluation. It is important to note that the conclusions that lead to this procedure are derived in large part from the available data. Always take into account that the treatment will also be dependent  on availability of resources and existing treatment guidelines, considered by other Pain Management Practitioners as being common knowledge and practice, at the time of the intervention. For Medico-Legal purposes, it is also important to point out that variation in procedural techniques and pharmacological choices are the acceptable norm. The indications, contraindications, technique, and results of the above procedure should only be interpreted and judged by a Board-Certified Interventional Pain Specialist with extensive familiarity and expertise in the same exact procedure and technique.

## 2020-06-01 NOTE — Patient Instructions (Signed)

## 2020-06-01 NOTE — Progress Notes (Signed)
Safety precautions to be maintained throughout the outpatient stay will include: orient to surroundings, keep bed in low position, maintain call bell within reach at all times, provide assistance with transfer out of bed and ambulation.  

## 2020-06-02 ENCOUNTER — Telehealth: Payer: Self-pay

## 2020-06-02 NOTE — Telephone Encounter (Signed)
POst procedure phone call.  Patient states that she is doing good.

## 2020-06-21 ENCOUNTER — Encounter: Payer: Self-pay | Admitting: Pain Medicine

## 2020-06-21 ENCOUNTER — Other Ambulatory Visit: Payer: Self-pay

## 2020-06-21 ENCOUNTER — Ambulatory Visit: Payer: Medicaid Other | Attending: Pain Medicine | Admitting: Pain Medicine

## 2020-06-21 VITALS — BP 130/87 | HR 80 | Temp 97.2°F | Resp 18 | Ht 65.0 in | Wt 249.0 lb

## 2020-06-21 DIAGNOSIS — M25551 Pain in right hip: Secondary | ICD-10-CM | POA: Diagnosis not present

## 2020-06-21 DIAGNOSIS — G8929 Other chronic pain: Secondary | ICD-10-CM | POA: Diagnosis not present

## 2020-06-21 DIAGNOSIS — M533 Sacrococcygeal disorders, not elsewhere classified: Secondary | ICD-10-CM | POA: Insufficient documentation

## 2020-06-21 DIAGNOSIS — M62830 Muscle spasm of back: Secondary | ICD-10-CM

## 2020-06-21 DIAGNOSIS — M549 Dorsalgia, unspecified: Secondary | ICD-10-CM | POA: Diagnosis not present

## 2020-06-21 MED ORDER — ORPHENADRINE CITRATE 30 MG/ML IJ SOLN
60.0000 mg | Freq: Once | INTRAMUSCULAR | Status: AC
  Administered 2020-06-21: 60 mg via INTRAMUSCULAR
  Filled 2020-06-21: qty 2

## 2020-06-21 MED ORDER — LIDOCAINE HCL 2 % IJ SOLN: 4.0000 mL | Freq: Once | INTRAMUSCULAR | Status: DC

## 2020-06-21 MED ORDER — ROPIVACAINE HCL 2 MG/ML IJ SOLN
4.0000 mL | Freq: Once | INTRAMUSCULAR | Status: AC
  Administered 2020-06-21: 4 mL
  Filled 2020-06-21: qty 10

## 2020-06-21 MED ORDER — METHYLPREDNISOLONE ACETATE 80 MG/ML IJ SUSP
80.0000 mg | Freq: Once | INTRAMUSCULAR | Status: AC
  Administered 2020-06-21: 80 mg via INTRA_ARTICULAR
  Filled 2020-06-21: qty 1

## 2020-06-21 NOTE — Progress Notes (Signed)
PROVIDER NOTE: Information contained herein reflects review and annotations entered in association with encounter. Interpretation of such information and data should be left to medically-trained personnel. Information provided to patient can be located elsewhere in the medical record under "Patient Instructions". Document created using STT-dictation technology, any transcriptional errors that may result from process are unintentional.    Patient: Danielle [name]  Service Category: E/M  Provider: Gaspar Cola, MD  DOB: 04/16/1961  DOS: 06/21/2020  Specialty: Interventional Pain Management  MRN: [id number]  Setting: Ambulatory outpatient  PCP: Danae Orleans, MD  Type: Established Patient    Referring Provider: Danae Orleans, MD  Location: Office  Delivery: Face-to-face     HPI  Ms. JAEDAH LORDS, a 59 y.o. year old female, is here today because of her Chronic hip pain, right [M25.551, G89.29]. Ms. Pankowski primary complain today is Back Pain (low) Last encounter: My last encounter with her was on 06/01/2020. Pertinent problems: Ms. Lefebre has Cervical spinal cord compression [facility]) (April, 2017); Cervical spinal stenosis; Groin pain; Chronic shoulder pain (1ry area of Pain) (Bilateral) (R>L); Chronic low back pain (3ry area of Pain) (Bilateral) (R>L); Lumbar facet syndrome (Bilateral) (R>L); Lumbar spondylosis; Failed back surgical syndrome; Chronic lower extremity pain (Right); Chronic neck pain (Bilateral) (R>L); Chronic cervical radicular pain (Right); Ulnar neuropathy (Left); Hx of cervical spine surgery; Cervical spondylosis; Fibromyalgia; Chronic sacroiliac pain (Bilateral) (L>R); History of total shoulder replacement (Left); Chronic pain syndrome; Complaints of weakness of lower extremity; Anterolisthesis; Cervical facet syndrome ([place]); Chronic musculoskeletal pain; Muscle spasm of back; Myofascial pain; Chronic hip pain (2ry area of Pain) (Bilateral) (R>L); 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 Pain Assessment: Severity of Chronic pain is reported as a 6 /10. Location: Back Lower/left anterior thigh. Onset: More than a month ago. Quality: Constant, Burning (electric shock). Timing: Constant. Modifying factor(s): icy hot, medications, rest, heat ice. Vitals:  height is '5\' 5"'  (1.651 m) and weight is 249 lb (112.9 kg). Her temporal temperature is 97.2 F (36.2 C) (abnormal). Her blood pressure is 130/87 and her pulse is 80. Her respiration is 18 and oxygen saturation is 100%.   Reason for encounter: post-procedure assessment.  According to the patient she attained 100% ongoing relief of the pain from the right intra-articular hip joint injection.  However, she has been experiencing more pain in the lower back, bilaterally (L>R).  This pain seems to be also referred towards the left anterior thigh to about the level of the knee.  She refers that this thigh pain is intermittent but when she gets that she feels a numbing burning sensation.   Physical exam today was positive for left lumbar facet arthralgia on provocative hyperextension on rotation maneuver towards the left side.  In addition, Patrick maneuver was positive bilaterally for pain coming from the SI joint area, but interestingly she is no longer experiencing any pain or decreased range of motion in the area of her hips.  Straight leg raise test was negative bilaterally.  Palpation was  positive for a very distinct left PSIS area trigger point.  The patient also had positive SI joint  distraction test for pain that seem to be bilateral and coming from the SI joint.  Thigh thrust test was positive on the left side for flareup of the patient's pain in the area of the left SI joint.  In view of the findings, today we have decided to proceed with a trigger point injection of the left PSI area and the area of the left quadratus lumborum and erector spinae muscle.  Post-Procedure Evaluation  Procedure (06/01/2020): Therapeutic right IA hip injection #3 under fluoroscopic guidance and IV sedation Pre-procedure pain level: 3/10 Post-procedure: 0/10 (100% relief)  Sedation: Sedation provided.  Effectiveness during initial hour after procedure(Ultra-Short Term Relief): 100 %.  Local anesthetic used: Long-acting (4-6 hours) Effectiveness: Defined as any analgesic benefit obtained secondary to the administration of local anesthetics. This carries significant diagnostic value as to the etiological location, or anatomical origin, of the pain. Duration of benefit is expected to coincide with the duration of the local anesthetic used.  Effectiveness during initial 4-6 hours after procedure(Short-Term Relief): 100 %.  Long-term benefit: Defined as any relief past the pharmacologic duration of the local anesthetics.  Effectiveness past the initial 6 hours after procedure(Long-Term Relief): 100 %.  Current benefits: Defined as benefit that persist at this time.   Analgesia:  90-100% better Function: Ms. Wohlfarth reports improvement in function ROM: Ms. Calip reports improvement in ROM  Pharmacotherapy Assessment   Analgesic: Oxycodone IR 10 mg, 1 tab PO q 6 hrs (40 mg/dayof oxycodone) MME/day:26m/day.   Monitoring: [place] PMP: PDMP reviewed during this encounter. Unable to conduct review of the controlled substance reporting system due to technological failure. Pharmacotherapy: No  side-effects or adverse reactions reported. Compliance: No problems identified. Effectiveness: Clinically acceptable.  SHart Rochester RN  06/21/2020  9:56 AM  Sign when Signing Visit Safety precautions to be maintained throughout the outpatient stay will include: orient to surroundings, keep bed in low position, maintain call bell within reach at all times, provide assistance with transfer out of bed and ambulation.     UDS:  Summary  Date Value Ref Range Status  01/13/2020 Note  Final    Comment:    ==================================================================== [employer] [facility] (MW) ==================================================================== Test                             Result       Flag       Units  Drug Present and Declared for Prescription Verification   Oxycodone                      >4587        EXPECTED   ng/mg creat   Oxymorphone                    319          EXPECTED   ng/mg creat   Noroxycodone                   >4587        EXPECTED   ng/mg creat   Noroxymorphone                 79           EXPECTED   ng/mg creat    Sources of oxycodone are scheduled prescription medications.    Oxymorphone, noroxycodone, and noroxymorphone are expected    metabolites of oxycodone. Oxymorphone is also available  as a    scheduled prescription medication.  ==================================================================== Test                      Result    Flag   Units      Ref Range   Creatinine              218              mg/dL      >=20 ==================================================================== Declared Medications:  The flagging and interpretation on this report are based on the  following declared medications.  Unexpected results may arise from  inaccuracies in the declared medications.   **Note: The testing scope of this panel includes these medications:   Oxycodone   **Note: The testing scope of this panel does not include the   following reported medications:   Albuterol (Ventolin HFA)  Citalopram (Celexa)  Diclofenac (Voltaren)  Docusate (Colace)  Fexofenadine (Allegra-D)  Fluticasone (Advair)  Guaifenesin (Mucinex)  Levothyroxine (Synthroid)  Magnesium (Milk of Magnesia)  Naloxone (Narcan)  Nystatin (Nystop)  Pseudoephedrine (Allegra-D)  Salmeterol (Advair)  Tiotropium (Spiriva)  Tizanidine (Zanaflex)  Varenicline (Chantix)  Vitamin D ==================================================================== For clinical consultation, please call [phone]. ====================================================================      ROS  Constitutional: Denies any fever or chills Gastrointestinal: No reported hemesis, hematochezia, vomiting, or acute GI distress Musculoskeletal: Denies any acute onset joint swelling, redness, loss of ROM, or weakness Neurological: No reported episodes of acute onset apraxia, aphasia, dysarthria, agnosia, amnesia, paralysis, loss of coordination, or loss of consciousness  Medication Review  Fluticasone-Umeclidin-Vilant, Olopatadine HCl, Oxycodone HCl, albuterol, cholecalciferol, citalopram, diclofenac Sodium, docusate sodium, guaiFENesin, levothyroxine, loratadine, magnesium hydroxide, naloxone, nystatin, and tiZANidine  History Review  Allergy: Ms. Leeth is allergic to aripiprazole, duloxetine, gabapentin, gold, iodinated diagnostic agents, naproxen sodium, nortriptyline hcl, nsaids, pregabalin, meperidine, ace inhibitors, aspirin, cefpodoxime, fluoxetine, fluticasone-salmeterol, levofloxacin, lithium, paroxetine, tape, telithromycin, theophylline, topiramate, trazodone, triamcinolone, venlafaxine, bupropion, ketorolac tromethamine, morphine, and moxifloxacin. Drug: Ms. Corrow  reports no history of drug use. Alcohol:  reports no history of alcohol use. Tobacco:  reports that she has been smoking cigarettes. She has a 80.00 pack-year smoking history. She has never  used smokeless tobacco. Social: Ms. Sferrazza  reports that she has been smoking cigarettes. She has a 80.00 pack-year smoking history. She has never used smokeless tobacco. She reports that she does not drink alcohol and does not use drugs. Medical:  has a past medical history of Acute postoperative pain (01/13/2017), Anxiety, Arthritis, Asthma, Bell's palsy, Bursitis, COPD (chronic obstructive pulmonary disease) ([place]), Depression, Fibromyalgia, Heart murmur, Hepatitis C, Hiatal hernia, Hyperlipidemia, Hypertension, IBS (irritable bowel syndrome), Insomnia, and Thyroid disease. Surgical: Ms. Abreu  has a past surgical history that includes necksurgery (10/21/2014); Back surgery (2013); Foot surgery (Right); Elbow surgery (Left); Carpal tunnel release (Right); Nose surgery; Partial hysterectomy; Total shoulder replacement (Bilateral); and Colonoscopy with propofol (N/A, 02/05/2017). Family: family history includes Cancer in her mother; Gout in her mother; Heart disease in her father.  Laboratory Chemistry Profile   Renal Lab Results  Component Value Date   BUN 14 04/28/2019   CREATININE 0.98 04/28/2019   BCR 13 02/16/2019   GFRAA >60 04/28/2019   GFRNONAA >60 04/28/2019     Hepatic Lab Results  Component Value Date   AST 18 04/28/2019   ALT 16 04/28/2019   ALBUMIN 3.7 04/28/2019   ALKPHOS 80 04/28/2019     Electrolytes Lab Results  Component Value Date   NA 139  04/28/2019   K 4.1 04/28/2019   CL 105 04/28/2019   CALCIUM 9.3 04/28/2019   MG 2.1 02/16/2019     Bone Lab Results  Component Value Date   25OHVITD1 27 (L) 02/16/2019   25OHVITD2 2.0 02/16/2019   25OHVITD3 25 02/16/2019     Inflammation (CRP: Acute Phase) (ESR: Chronic Phase) Lab Results  Component Value Date   CRP 2 02/16/2019   ESRSEDRATE 23 02/16/2019       Note: Above Lab results reviewed.  Recent Imaging Review  DG PAIN CLINIC C-ARM 1-60 MIN NO REPORT Fluoro was used, but no Radiologist interpretation  will be provided.  Please refer to "NOTES" tab for provider progress note. Note: Reviewed        Physical Exam  General appearance: Well nourished, well developed, and well hydrated. In no apparent acute distress Mental status: Alert, oriented x 3 (person, place, & time)       Respiratory: No evidence of acute respiratory distress Eyes: PERLA Vitals: BP 130/87   Pulse 80   Temp (!) 97.2 F (36.2 C) (Temporal)   Resp 18   Ht '5\' 5"'  (1.651 m)   Wt 249 lb (112.9 kg)   SpO2 100%   BMI 41.44 kg/m  BMI: Estimated body mass index is 41.44 kg/m as calculated from the following:   Height as of this encounter: '5\' 5"'  (1.651 m).   Weight as of this encounter: 249 lb (112.9 kg). Ideal: Ideal body weight: 57 kg (125 lb 10.6 oz) Adjusted ideal body weight: 79.4 kg (175 lb)  Assessment   Status Diagnosis  Controlled Controlled Controlled 1. Chronic hip pain (Right)   2. Chronic sacroiliac pain (Bilateral) (L>R)   3. Trigger point with back pain   4. Muscle spasm of back      Updated Problems: Problem  Chronic sacroiliac pain (Bilateral) (L>R)    Plan of Care  Orders:  Orders Placed This Encounter  Procedures  . TRIGGER POINT INJECTION    Scheduling Instructions:     Area: Lower Back     Side: Left     Sedation: No Sedation.     Timeframe: Today    Order Specific Question:   Where will this procedure be performed?    Answer:   [facility]  . Informed Consent Details: Physician/Practitioner Attestation; Transcribe to consent form and obtain patient signature    Provider Attestation: I, [place] [name], MD, (Pain Management Specialist), the physician/practitioner, attest that I have discussed with the patient the benefits, risks, side effects, alternatives, likelihood of achieving goals and potential problems during recovery for the procedure that I have provided informed consent.    Scheduling Instructions:     Note: Always confirm laterality of pain with Ms.  Domingo Cocking, before procedure.     Transcribe to consent form and obtain patient signature.    Order Specific Question:   Physician/Practitioner attestation of informed consent for procedure/surgical case    Answer:   I, the physician/practitioner, attest that I have discussed with the patient the benefits, risks, side effects, alternatives, likelihood of achieving goals and potential problems during recovery for the procedure that I have provided informed consent.    Order Specific Question:   Procedure    Answer:   Myoneural Block (Trigger Point injection)    Order Specific Question:   Physician/Practitioner performing the procedure    Answer:   Tiffanie Blassingame A. [name] MD    Order Specific Question:   Indication/Reason  Answer:   Musculoskeletal pain/myofascial pain secondary to trigger point   Procedure:          Anesthesia, Analgesia, Anxiolysis:  Type: Left PSIS, quadratus lumborum and erector spinae muscle Trigger Point Injection (1-2 muscle groups)          CPT: 20552 Primary Purpose: Therapeutic Region: Posterior Lumbosacral Level: Lumbosacral Target Area: Left PSIS area including the quadratus lumborum and erector spinae muscle Trigger Point Approach: Percutaneous, ipsilateral approach. Laterality: Left-Sided        Type: Local Anesthesia Indication(s): Analgesia         Local Anesthetic: Lidocaine 1-2% Route: Infiltration ([place]/IM) IV Access: Declined Sedation: Declined   Position: Sitting   Indications: 1. Chronic hip pain (Right)   2. Chronic sacroiliac pain (Bilateral) (L>R)   3. Trigger point with back pain   4. Muscle spasm of back    Pain Score: Pre-procedure: 6 /10 Post-procedure: 0-No pain/10   Pre-op H&P Assessment:  Ms. Perry is a 59 y.o. (year old), female patient, seen today for interventional treatment. She  has a past surgical history that includes necksurgery (10/21/2014); Back surgery (2013); Foot surgery (Right); Elbow surgery (Left); Carpal tunnel release  (Right); Nose surgery; Partial hysterectomy; Total shoulder replacement (Bilateral); and Colonoscopy with propofol (N/A, 02/05/2017). Ms. Dols has a current medication list which includes the following prescription(s): albuterol, cholecalciferol, citalopram, diclofenac sodium, docusate sodium, trelegy ellipta, guaifenesin, levothyroxine, loratadine, magnesium hydroxide, naloxone, nystatin, olopatadine hcl, oxycodone hcl, oxycodone hcl, [START ON 07/15/2020] oxycodone hcl, and tizanidine, and the following Facility-Administered Medications: lidocaine. Her primarily concern today is the Back Pain (low)  Initial Vital Signs:  Pulse/HCG Rate: 97  Temp: (!) 97.2 F (36.2 C) Resp: 18 BP: 130/78 SpO2: 100 %  BMI: Estimated body mass index is 41.44 kg/m as calculated from the following:   Height as of this encounter: '5\' 5"'  (1.651 m).   Weight as of this encounter: 249 lb (112.9 kg).  Risk Assessment: Allergies: Reviewed. She is allergic to aripiprazole, duloxetine, gabapentin, gold, iodinated diagnostic agents, naproxen sodium, nortriptyline hcl, nsaids, pregabalin, meperidine, ace inhibitors, aspirin, cefpodoxime, fluoxetine, fluticasone-salmeterol, levofloxacin, lithium, paroxetine, tape, telithromycin, theophylline, topiramate, trazodone, triamcinolone, venlafaxine, bupropion, ketorolac tromethamine, morphine, and moxifloxacin.  Allergy Precautions: None required Coagulopathies: Reviewed. None identified.  Blood-thinner therapy: None at this time Active Infection(s): Reviewed. None identified. Ms. Harewood is afebrile  Site Confirmation: Ms. Renda was asked to confirm the procedure and laterality before marking the site Procedure checklist: Completed Consent: Before the procedure and under the influence of no sedative(s), amnesic(s), or anxiolytics, the patient was informed of the treatment options, risks and possible complications. To fulfill our ethical and legal obligations, as recommended by  the American Medical Association's Code of Ethics, I have informed the patient of my clinical impression; the nature and purpose of the treatment or procedure; the risks, benefits, and possible complications of the intervention; the alternatives, including doing nothing; the risk(s) and benefit(s) of the alternative treatment(s) or procedure(s); and the risk(s) and benefit(s) of doing nothing. The patient was provided information about the general risks and possible complications associated with the procedure. These may include, but are not limited to: failure to achieve desired goals, infection, bleeding, organ or nerve damage, allergic reactions, paralysis, and death. In addition, the patient was informed of those risks and complications associated to the procedure, such as failure to decrease pain; infection; bleeding; organ or nerve damage with subsequent damage to sensory, motor, and/or autonomic systems, resulting in permanent pain, numbness, and/or weakness  of one or several areas of the body; allergic reactions; (i.e.: anaphylactic reaction); and/or death. Furthermore, the patient was informed of those risks and complications associated with the medications. These include, but are not limited to: allergic reactions (i.e.: anaphylactic or anaphylactoid reaction(s)); adrenal axis suppression; blood sugar elevation that in diabetics may result in ketoacidosis or comma; water retention that in patients with history of congestive heart failure may result in shortness of breath, pulmonary edema, and decompensation with resultant heart failure; weight gain; swelling or edema; medication-induced neural toxicity; particulate matter embolism and blood vessel occlusion with resultant organ, and/or nervous system infarction; and/or aseptic necrosis of one or more joints. Finally, the patient was informed that Medicine is not an exact science; therefore, there is also the possibility of unforeseen or unpredictable  risks and/or possible complications that may result in a catastrophic outcome. The patient indicated having understood very clearly. We have given the patient no guarantees and we have made no promises. Enough time was given to the patient to ask questions, all of which were answered to the patient's satisfaction. Ms. Adolf has indicated that she wanted to continue with the procedure. Attestation: I, the ordering provider, attest that I have discussed with the patient the benefits, risks, side-effects, alternatives, likelihood of achieving goals, and potential problems during recovery for the procedure that I have provided informed consent. Date  Time: 06/21/2020  9:57 AM  Pre-Procedure Preparation:  Monitoring: As per clinic protocol. Respiration, ETCO2, SpO2, BP, heart rate and rhythm monitor placed and checked for adequate function Safety Precautions: Patient was assessed for positional comfort and pressure points before starting the procedure. Time-out: I initiated and conducted the "Time-out" before starting the procedure, as per protocol. The patient was asked to participate by confirming the accuracy of the "Time Out" information. Verification of the correct person, site, and procedure were performed and confirmed by me, the nursing staff, and the patient. "Time-out" conducted as per Joint Commission's Universal Protocol (UP.01.01.01). Time: 1120  Description of Procedure:          Area Prepped: Entire             Region DuraPrep (Iodine Povacrylex [0.7% available iodine] and Isopropyl Alcohol, 74% w/w) Safety Precautions: Aspiration looking for blood return was conducted prior to all injections. At no point did we inject any substances, as a needle was being advanced. No attempts were made at seeking any paresthesias. Safe injection practices and needle disposal techniques used. Medications properly checked for expiration dates. SDV (single dose vial) medications used. Description of the  Procedure: Protocol guidelines were followed. The patient was placed in position over the fluoroscopy table. The target area was identified and the area prepped in the usual manner. Skin & deeper tissues infiltrated with local anesthetic. Appropriate amount of time allowed to pass for local anesthetics to take effect. The procedure needles were then advanced to the target area. Proper needle placement secured. Negative aspiration confirmed. Solution injected in intermittent fashion, asking for systemic symptoms every 0.5cc of injectate. The needles were then removed and the area cleansed, making sure to leave some of the prepping solution back to take advantage of its long term bactericidal properties.  Vitals:   06/21/20 0954 06/21/20 1124  BP: 130/78 130/87  Pulse: 97 80  Resp: 18 18  Temp: (!) 97.2 F (36.2 C)   TempSrc: Temporal   SpO2: 100% 100%  Weight: 249 lb (112.9 kg)   Height: '5\' 5"'  (1.651 m)     Start  Time: 1120 hrs. End Time: 1124 hrs. Materials:  Needle(s) Type: Regular needle Gauge: 25G Length: 1.5-in Medication(s): Please see orders for medications and dosing details.  Imaging Guidance:          Type of Imaging Technique: None used Indication(s): N/A Exposure Time: No patient exposure Contrast: None used. Fluoroscopic Guidance: N/A Ultrasound Guidance: N/A Interpretation: N/A  Antibiotic Prophylaxis:   Anti-infectives (From admission, onward)   None     Indication(s): None identified  Post-operative Assessment:  Post-procedure Vital Signs:  Pulse/HCG Rate: 80  Temp: (!) 97.2 F (36.2 C) Resp: 18 BP: 130/87 SpO2: 100 %  EBL: None  Complications: No immediate post-treatment complications observed by team, or reported by patient.  Note: The patient tolerated the entire procedure well. A repeat set of vitals were taken after the procedure and the patient was kept under observation following institutional policy, for this type of procedure.  Post-procedural neurological assessment was performed, showing return to baseline, prior to discharge. The patient was provided with post-procedure discharge instructions, including a section on how to identify potential problems. Should any problems arise concerning this procedure, the patient was given instructions to immediately contact us, at any time, without hesitation. In any case, we plan to contact the patient by telephone for a follow-up status report regarding this interventional procedure.  Comments:  No additional relevant information.  Plan of Care  Orders:  Orders Placed This Encounter  Procedures  . TRIGGER POINT INJECTION    Scheduling Instructions:     Area: Lower Back     Side: Left     Sedation: No Sedation.     Timeframe: Today    Order Specific Question:   Where will this procedure be performed?    Answer:   [facility]  . Informed Consent Details: Physician/Practitioner Attestation; Transcribe to consent form and obtain patient signature    Provider Attestation: I, [place] [name], MD, (Pain Management Specialist), the physician/practitioner, attest that I have discussed with the patient the benefits, risks, side effects, alternatives, likelihood of achieving goals and potential problems during recovery for the procedure that I have provided informed consent.    Scheduling Instructions:     Note: Always confirm laterality of pain with Ms. Domingo Cocking, before procedure.     Transcribe to consent form and obtain patient signature.    Order Specific Question:   Physician/Practitioner attestation of informed consent for procedure/surgical case    Answer:   I, the physician/practitioner, attest that I have discussed with the patient the benefits, risks, side effects, alternatives, likelihood of achieving goals and potential problems during recovery for the procedure that I have provided informed consent.    Order Specific Question:   Procedure    Answer:    Myoneural Block (Trigger Point injection)    Order Specific Question:   Physician/Practitioner performing the procedure    Answer:   Janila Arrazola A. [name] MD    Order Specific Question:   Indication/Reason    Answer:   Musculoskeletal pain/myofascial pain secondary to trigger point   Chronic Opioid Analgesic:  Oxycodone IR 10 mg, 1 tab PO q 6 hrs (40 mg/dayof oxycodone) MME/day:46m/day.   Medications ordered for procedure: Meds ordered this encounter  Medications  . orphenadrine (NORFLEX) injection 60 mg  . methylPREDNISolone acetate (DEPO-MEDROL) injection 80 mg  . ropivacaine (PF) 2 mg/mL (0.2%) (NAROPIN) injection 4 mL  . lidocaine (XYLOCAINE) 2 % (with pres) injection 80 mg   Medications administered: We administered orphenadrine, methylPREDNISolone acetate,  and ropivacaine (PF) 2 mg/mL (0.2%).  See the medical record for exact dosing, route, and time of administration.  Follow-up plan:   Return in about 2 weeks (around 07/05/2020) for Procedure (w/ sedation): (L) SI BLK.       Considering: Possible bilateral hip jointRFA Diagnostic caudal ESI + epidurogram  Possible Racz procedure.  Possible bilateral cervical facetRFA Diagnostic right-sided CESI  Diagnostic left sacroiliac joint block Possible bilateral sacroiliac joint RFA   Palliative PRN treatment(s): Palliative bilateral trochanteric bursa #2  Palliative bilateralsuprascapular nerve block #3 Palliative bilateral suprascapular nerveRFA #2(last done on 05/13/2019) Diagnostic right IA hip joint injection #4 (last done 06/01/2020)  Diagnostic left IA hip joint injection #3 (last done 04/30/2018)  Diagnostic bilateral lumbar facet block #4 Palliative right lumbar facet RFA #3(last done 12/16/2017)  Palliative left lumbar facet RFA #3(last done 02/12/2018)  Diagnostic bilateral cervical facet block #2 Diagnostic right SI joint block #2    Recent Visits Date Type Provider Dept  06/01/20 Procedure  visit Milinda Pointer, MD [facility]  05/15/20 Office Visit Milinda Pointer, MD [facility]  Showing recent visits within past 90 days and meeting all other requirements Today's Visits Date Type Provider Dept  06/21/20 Office Visit Milinda Pointer, MD [facility]  Showing today's visits and meeting all other requirements Future Appointments Date Type Provider Dept  08/09/20 Appointment Milinda Pointer, MD [facility]  Showing future appointments within next 90 days and meeting all other requirements  Disposition: Discharge home  Discharge (Date  Time): 06/21/2020; 1125 hrs.   Primary Care Physician: Danae Orleans, MD Location: [facility] Note by: Gaspar Cola, MD Date: 06/21/2020; Time: 1:09 PM  Disclaimer:  Medicine is not an Chief Strategy Officer. The only guarantee in medicine is that nothing is guaranteed. It is important to note that the decision to proceed with this intervention was based on the information collected from the patient. The Data and conclusions were drawn from the patient's questionnaire, the interview, and the physical examination. Because the information was provided in large part by the patient, it cannot be guaranteed that it has not been purposely or unconsciously manipulated. Every effort has been made to obtain as much relevant data as possible for this evaluation. It is important to note that the conclusions that lead to this procedure are derived in large part from the available data. Always take into account that the treatment will also be dependent on availability of resources and existing treatment guidelines, considered by other Pain Management Practitioners as being common knowledge and practice, at the time of the intervention. For Medico-Legal purposes, it is also important to point out that variation in procedural techniques and pharmacological choices are the acceptable norm.  The indications, contraindications, technique, and results of the above procedure should only be interpreted and judged by a Board-Certified Interventional Pain Specialist with extensive familiarity and expertise in the same exact procedure and technique.

## 2020-06-21 NOTE — Progress Notes (Signed)
Safety precautions to be maintained throughout the outpatient stay will include: orient to surroundings, keep bed in low position, maintain call bell within reach at all times, provide assistance with transfer out of bed and ambulation.  

## 2020-06-21 NOTE — Patient Instructions (Addendum)
____________________________________________________________________________________________  Post-Procedure Discharge Instructions  Instructions:  Apply ice:   Purpose: This will minimize any swelling and discomfort after procedure.   When: Day of procedure, as soon as you get home.  How: Fill a plastic sandwich bag with crushed ice. Cover it with a small towel and apply to injection site.  How long: (15 min on, 15 min off) Apply for 15 minutes then remove x 15 minutes.  Repeat sequence on day of procedure, until you go to bed.  Apply heat:   Purpose: To treat any soreness and discomfort from the procedure.  When: Starting the next day after the procedure.  How: Apply heat to procedure site starting the day following the procedure.  How long: May continue to repeat daily, until discomfort goes away.  Food intake: Start with clear liquids (like water) and advance to regular food, as tolerated.   Physical activities: Keep activities to a minimum for the first 8 hours after the procedure. After that, then as tolerated.  Driving: If you have received any sedation, be responsible and do not drive. You are not allowed to drive for 24 hours after having sedation.  Blood thinner: (Applies only to those taking blood thinners) You may restart your blood thinner 6 hours after your procedure.  Insulin: (Applies only to Diabetic patients taking insulin) As soon as you can eat, you may resume your normal dosing schedule.  Infection prevention: Keep procedure site clean and dry. Shower daily and clean area with soap and water.  Post-procedure Pain Diary: Extremely important that this be done correctly and accurately. Recorded information will be used to determine the next step in treatment. For the purpose of accuracy, follow these rules:  Evaluate only the area treated. Do not report or include pain from an untreated area. For the purpose of this evaluation, ignore all other areas of pain,  except for the treated area.  After your procedure, avoid taking a long nap and attempting to complete the pain diary after you wake up. Instead, set your alarm clock to go off every hour, on the hour, for the initial 8 hours after the procedure. Document the duration of the numbing medicine, and the relief you are getting from it.  Do not go to sleep and attempt to complete it later. It will not be accurate. If you received sedation, it is likely that you were given a medication that may cause amnesia. Because of this, completing the diary at a later time may cause the information to be inaccurate. This information is needed to plan your care.  Follow-up appointment: Keep your post-procedure follow-up evaluation appointment after the procedure (usually 2 weeks for most procedures, 6 weeks for radiofrequencies). DO NOT FORGET to bring you pain diary with you.   Expect: (What should I expect to see with my procedure?)  From numbing medicine (AKA: Local Anesthetics): Numbness or decrease in pain. You may also experience some weakness, which if present, could last for the duration of the local anesthetic.  Onset: Full effect within 15 minutes of injected.  Duration: It will depend on the type of local anesthetic used. On the average, 1 to 8 hours.   From steroids (Applies only if steroids were used): Decrease in swelling or inflammation. Once inflammation is improved, relief of the pain will follow.  Onset of benefits: Depends on the amount of swelling present. The more swelling, the longer it will take for the benefits to be seen. In some cases, up to 10 days.    Duration: Steroids will stay in the system x 2 weeks. Duration of benefits will depend on multiple posibilities including persistent irritating factors.  Side-effects: If present, they may typically last 2 weeks (the duration of the steroids).  Frequent: Cramps (if they occur, drink Gatorade and take over-the-counter Magnesium 450-500 mg  once to twice a day); water retention with temporary weight gain; increases in blood sugar; decreased immune system response; increased appetite.  Occasional: Facial flushing (red, warm cheeks); mood swings; menstrual changes.  Uncommon: Long-term decrease or suppression of natural hormones; bone thinning. (These are more common with higher doses or more frequent use. This is why we prefer that our patients avoid having any injection therapies in other practices.)   Very Rare: Severe mood changes; psychosis; aseptic necrosis.  From procedure: Some discomfort is to be expected once the numbing medicine wears off. This should be minimal if ice and heat are applied as instructed.  Call if: (When should I call?)  You experience numbness and weakness that gets worse with time, as opposed to wearing off.  New onset bowel or bladder incontinence. (Applies only to procedures done in the spine)  Emergency Numbers:  Durning business hours (Monday - Thursday, 8:00 AM - 4:00 PM) (Friday, 9:00 AM - 12:00 Noon): [phone]  After hours: [phone]  NOTE: If you are having a problem and are unable connect with, or to talk to a provider, then go to your nearest urgent care or emergency department. If the problem is serious and urgent, please call 911. ____________________________________________________________________________________________   ____________________________________________________________________________________________  Preparing for Procedure with Sedation  Procedure appointments are limited to planned procedures: . No Prescription Refills. . No disability issues will be discussed. . No medication changes will be discussed.  Instructions: . Oral Intake: Do not eat or drink anything for at least 8 hours prior to your procedure. (Exception: Blood Pressure Medication. See below.) . Transportation: Unless otherwise stated by your physician, you may drive yourself after the  procedure. . Blood Pressure Medicine: Do not forget to take your blood pressure medicine with a sip of water the morning of the procedure. If your Diastolic (lower reading)is above 100 mmHg, elective cases will be cancelled/rescheduled. . Blood thinners: These will need to be stopped for procedures. Notify our staff if you are taking any blood thinners. Depending on which one you take, there will be specific instructions on how and when to stop it. . Diabetics on insulin: Notify the staff so that you can be scheduled 1st case in the morning. If your diabetes requires high dose insulin, take only  of your normal insulin dose the morning of the procedure and notify the staff that you have done so. . Preventing infections: Shower with an antibacterial soap the morning of your procedure. . Build-up your immune system: Take 1000 mg of Vitamin C with every meal (3 times a day) the day prior to your procedure. . Antibiotics: Inform the staff if you have a condition or reason that requires you to take antibiotics before dental procedures. . Pregnancy: If you are pregnant, call and cancel the procedure. . Sickness: If you have a cold, fever, or any active infections, call and cancel the procedure. . Arrival: You must be in the facility at least 30 minutes prior to your scheduled procedure. . Children: Do not bring children with you. . Dress appropriately: Bring dark clothing that you would not mind if they get stained. . Valuables: Do not bring any jewelry or valuables.    Reasons to call and reschedule or cancel your procedure: (Following these recommendations will minimize the risk of a serious complication.) . Surgeries: Avoid having procedures within 2 weeks of any surgery. (Avoid for 2 weeks before or after any surgery). . Flu Shots: Avoid having procedures within 2 weeks of a flu shots or . (Avoid for 2 weeks before or after immunizations). . Barium: Avoid having a procedure within 7-10 days after  having had a radiological study involving the use of radiological contrast. (Myelograms, Barium swallow or enema study). . Heart attacks: Avoid any elective procedures or surgeries for the initial 6 months after a "Myocardial Infarction" (Heart Attack). . Blood thinners: It is imperative that you stop these medications before procedures. Let us know if you if you take any blood thinner.  . Infection: Avoid procedures during or within two weeks of an infection (including chest colds or gastrointestinal problems). Symptoms associated with infections include: Localized redness, fever, chills, night sweats or profuse sweating, burning sensation when voiding, cough, congestion, stuffiness, runny nose, sore throat, diarrhea, nausea, vomiting, cold or Flu symptoms, recent or current infections. It is specially important if the infection is over the area that we intend to treat. Marland Kitchen Heart and lung problems: Symptoms that may suggest an active cardiopulmonary problem include: cough, chest pain, breathing difficulties or shortness of breath, dizziness, ankle swelling, uncontrolled high or unusually low blood pressure, and/or palpitations. If you are experiencing any of these symptoms, cancel your procedure and contact your primary care physician for an evaluation.  Remember:  Regular Business hours are:  Monday to Thursday 8:00 AM to 4:00 PM  Provider's Schedule: Milinda Pointer, MD:  Procedure days: Tuesday and Thursday 7:30 AM to 4:00 PM  Gillis Santa, MD:  Procedure days: Monday and Wednesday 7:30 AM to 4:00 PM ____________________________________________________________________________________________   Post-procedure Information What to expect: Most procedures involve the use of a local anesthetic (numbing medicine), and a steroid (anti-inflammatory medicine).  The local anesthetics may cause temporary numbness and weakness of the legs or arms, depending on the location of the block. This  numbness/weakness may last 4-6 hours, depending on the local anesthetic used. In rare instances, it can last up to 24 hours. While numb, you must be very careful not to injure the extremity.  After any procedure, you could expect the pain to get better within 15-20 minutes. This relief is temporary and may last 4-6 hours. Once the local anesthetics wears off, you could experience discomfort, possibly more than usual, for up to 10 (ten) days. In the case of radiofrequencies, it may last up to 6 weeks. Surgeries may take up to 8 weeks for the healing process. The discomfort is due to the irritation caused by needles going through skin and muscle. To minimize the discomfort, we recommend using ice the first day, and heat from then on. The ice should be applied for 15 minutes on, and 15 minutes off. Keep repeating this cycle until bedtime. Avoid applying the ice directly to the skin, to prevent frostbite. Heat should be used daily, until the pain improves (4-10 days). Be careful not to burn yourself.  Occasionally you may experience muscle spasms or cramps. These occur as a consequence of the irritation caused by the needle sticks to the muscle and the blood that will inevitably be lost into the surrounding muscle tissue. Blood tends to be very irritating to tissues, which tend to react by going into spasm. These spasms may start the same day of your procedure, but  they may also take days to develop. This late onset type of spasm or cramp is usually caused by electrolyte imbalances triggered by the steroids, at the level of the kidney. Cramps and spasms tend to respond well to muscle relaxants, multivitamins (some are triggered by the procedure, but may have their origins in vitamin deficiencies), and "Gatorade", or any sports drinks that can replenish any electrolyte imbalances. (If you are a diabetic, ask your pharmacist to get you a sugar-free brand.) Warm showers or baths may also be helpful. Stretching  exercises are highly recommended. General Instructions:  Be alert for signs of possible infection: redness, swelling, heat, red streaks, elevated temperature, and/or fever. These typically appear 4 to 6 days after the procedure. Immediately notify your doctor if you experience unusual bleeding, difficulty breathing, or loss of bowel or bladder control. If you experience increased pain, do not increase your pain medicine intake, unless instructed by your pain physician. Post-Procedure Care:  Be careful in moving about. Muscle spasms in the area of the injection may occur. Applying ice or heat to the area is often helpful. The incidence of spinal headaches after epidural injections ranges between 1.4% and 6%. If you develop a headache that does not seem to respond to conservative therapy, please let your physician know. This can be treated with an epidural blood patch.   Post-procedure numbness or redness is to be expected, however it should average 4 to 6 hours. If numbness and weakness of your extremities begins to develop 4 to 6 hours after your procedure, and is felt to be progressing and worsening, immediately contact your physician.   Diet:  If you experience nausea, do not eat until this sensation goes away. If you had a "Stellate Ganglion Block" for upper extremity "Reflex Sympathetic Dystrophy", do not eat or drink until your hoarseness goes away. In any case, always start with liquids first and if you tolerate them well, then slowly progress to more solid foods. Activity:  For the first 4 to 6 hours after the procedure, use caution in moving about as you may experience numbness and/or weakness. Use caution in cooking, using household electrical appliances, and climbing steps. If you need to reach your Doctor call our office: [phone]) [phone] Monday-Thursday 8:00 am - 4:00 PM    Fridays: Closed     In case of an emergency: In case of emergency, call 911 or go to the nearest emergency room and  have the physician there call us.  Interpretation of Procedure Every nerve block has two components: a diagnostic component, and a treatment component. Unrealistic expectations are the most common causes of "perceived failure".  In a perfect world, a single nerve block should be able to completely and permanently eliminate the pain. Sadly, the world is not perfect.  Most pain management nerve blocks are performed using local anesthetics and steroids. Steroids are responsible for any long-term benefit that you may experience. Their purpose is to decrease any chronic swelling that may exist in the area. Steroids begin to work immediately after being injected. However, most patients will not experience any benefits until 5 to 10 days after the injection, when the swelling has come down to the point where they can tell a difference. Steroids will only help if there is swelling to be treated. As such, they can assist with the diagnosis. If effective, they suggest an inflammatory component to the pain, and if ineffective, they rule out inflammation as the main cause or component of the problem. If  the problem is one of mechanical compression, you will get no benefit from those steroids.   In the case of local anesthetics, they have a crucial role in the diagnosis of your condition. Most will begin to work within15 to 20 minutes after injection. The duration will depend on the type used (short- vs. Long-acting). It is of outmost importance that patients keep tract of their pain, after the procedure. To assist with this matter, a "Post-procedure Pain Diary" is provided. Make sure to complete it and to bring it back to your follow-up appointment.  As long as the patient keeps accurate, detailed records of their symptoms after every procedure, and returns to have those interpreted, every procedure will provide [place] with invaluable information. Even a block that does not provide the patient with any relief, will always  provide [place] with information about the mechanism and the origin of the pain. The only time a nerve block can be considered a waste of time is when patients do not keep track of the results, or do not keep their post-procedure appointment.  Reporting the results back to your physician The Pain Score  Pain is a subjective complaint. It cannot be seen, touched, or measured. We depend entirely on the patient's report of the pain in order to assess your condition and treatment. To evaluate the pain, we use a pain scale, where "0" means "No Pain", and a "10" is "the worst possible pain that you can even imagine" (i.e. something like been eaten alive by a shark or being torn apart by a lion).   You will frequently be asked to rate your pain. Please be as accurate, remember that medical decisions will be based on your responses. Please do not rate your pain above a 10. Doing so is actually interpreted as "symptom magnification" (exaggeration), as well as lack of understanding with regards to the scale. To put this into perspective, when you tell us that your pain is at a 10 (ten), what you are saying is that there is nothing we can do to make this pain any worse. (Carefully think about that.)

## 2020-07-04 ENCOUNTER — Telehealth: Payer: Self-pay | Admitting: *Deleted

## 2020-07-04 NOTE — Telephone Encounter (Signed)
Contacted in attempt to schedule lung screening scan. Patient reports that she is unable to discuss at this time. Requests a call back in 2 to 3 weeks.

## 2020-07-06 ENCOUNTER — Other Ambulatory Visit: Payer: Self-pay

## 2020-07-06 ENCOUNTER — Encounter: Payer: Self-pay | Admitting: Pain Medicine

## 2020-07-06 ENCOUNTER — Ambulatory Visit (HOSPITAL_BASED_OUTPATIENT_CLINIC_OR_DEPARTMENT_OTHER): Payer: Medicaid Other | Admitting: Pain Medicine

## 2020-07-06 ENCOUNTER — Ambulatory Visit
Admission: RE | Admit: 2020-07-06 | Discharge: 2020-07-06 | Disposition: A | Discharge status: Home / Self Care | Payer: Medicaid Other | Source: Ambulatory Visit | Attending: Pain Medicine | Admitting: Pain Medicine

## 2020-07-06 VITALS — BP 103/80 | HR 92 | Temp 97.9°F | Resp 16 | Ht 65.0 in | Wt 250.0 lb

## 2020-07-06 DIAGNOSIS — M779 Enthesopathy, unspecified: Secondary | ICD-10-CM | POA: Diagnosis not present

## 2020-07-06 DIAGNOSIS — M9904 Segmental and somatic dysfunction of sacral region: Secondary | ICD-10-CM

## 2020-07-06 DIAGNOSIS — M533 Sacrococcygeal disorders, not elsewhere classified: Secondary | ICD-10-CM

## 2020-07-06 DIAGNOSIS — M47898 Other spondylosis, sacral and sacrococcygeal region: Secondary | ICD-10-CM | POA: Insufficient documentation

## 2020-07-06 DIAGNOSIS — M545 Low back pain, unspecified: Secondary | ICD-10-CM | POA: Insufficient documentation

## 2020-07-06 DIAGNOSIS — G8929 Other chronic pain: Secondary | ICD-10-CM | POA: Insufficient documentation

## 2020-07-06 DIAGNOSIS — Z6841 Body Mass Index (BMI) 40.0 and over, adult: Secondary | ICD-10-CM | POA: Diagnosis not present

## 2020-07-06 DIAGNOSIS — Z9189 Other specified personal risk factors, not elsewhere classified: Secondary | ICD-10-CM | POA: Diagnosis not present

## 2020-07-06 DIAGNOSIS — Z91041 Radiographic dye allergy status: Secondary | ICD-10-CM | POA: Insufficient documentation

## 2020-07-06 DIAGNOSIS — Z87892 Personal history of anaphylaxis: Secondary | ICD-10-CM | POA: Diagnosis not present

## 2020-07-06 DIAGNOSIS — Z889 Allergy status to unspecified drugs, medicaments and biological substances status: Secondary | ICD-10-CM | POA: Insufficient documentation

## 2020-07-06 MED ORDER — FENTANYL CITRATE (PF) 100 MCG/2ML IJ SOLN
INTRAMUSCULAR | Status: AC
  Filled 2020-07-06: qty 2

## 2020-07-06 MED ORDER — ROPIVACAINE HCL 2 MG/ML IJ SOLN
4.0000 mL | Freq: Once | INTRAMUSCULAR | Status: AC
  Administered 2020-07-06: 10:00:00 4 mL via INTRA_ARTICULAR

## 2020-07-06 MED ORDER — MIDAZOLAM HCL 5 MG/5ML IJ SOLN
1.0000 mg | INTRAMUSCULAR | Status: DC | PRN
  Administered 2020-07-06: 10:00:00 2 mg via INTRAVENOUS

## 2020-07-06 MED ORDER — FENTANYL CITRATE (PF) 100 MCG/2ML IJ SOLN
25.0000 ug | INTRAMUSCULAR | Status: DC | PRN
  Administered 2020-07-06: 50 ug via INTRAVENOUS

## 2020-07-06 MED ORDER — METHYLPREDNISOLONE ACETATE 80 MG/ML IJ SUSP
80.0000 mg | Freq: Once | INTRAMUSCULAR | Status: AC
  Administered 2020-07-06: 10:00:00 80 mg via INTRA_ARTICULAR

## 2020-07-06 MED ORDER — METHYLPREDNISOLONE ACETATE 80 MG/ML IJ SUSP
INTRAMUSCULAR | Status: AC
  Filled 2020-07-06: qty 1

## 2020-07-06 MED ORDER — LIDOCAINE HCL (PF) 2 % IJ SOLN
INTRAMUSCULAR | Status: AC
  Filled 2020-07-06: qty 5

## 2020-07-06 MED ORDER — MIDAZOLAM HCL 5 MG/5ML IJ SOLN
INTRAMUSCULAR | Status: AC
  Filled 2020-07-06: qty 5

## 2020-07-06 MED ORDER — LIDOCAINE HCL 2 % IJ SOLN
20.0000 mL | Freq: Once | INTRAMUSCULAR | Status: AC
  Administered 2020-07-06: 10:00:00 100 mg

## 2020-07-06 MED ORDER — LACTATED RINGERS IV SOLN
1000.0000 mL | Freq: Once | INTRAVENOUS | Status: AC
  Administered 2020-07-06: 10:00:00 1000 mL via INTRAVENOUS

## 2020-07-06 MED ORDER — ROPIVACAINE HCL 2 MG/ML IJ SOLN
INTRAMUSCULAR | Status: AC
  Filled 2020-07-06: qty 10

## 2020-07-06 NOTE — Progress Notes (Signed)
PROVIDER NOTE: Information contained herein reflects review and annotations entered in association with encounter. Interpretation of such information and data should be left to medically-trained personnel. Information provided to patient can be located elsewhere in the medical record under "Patient Instructions". Document created using STT-dictation technology, any transcriptional errors that may result from process are unintentional.    Patient: Danielle [name]  Service Category: Procedure  Provider: Gaspar Cola, MD  DOB: 05/08/1961  DOS: 07/06/2020  Location: [facility]  MRN: [id number]  Setting: Ambulatory - outpatient  Referring Provider: Danae Orleans, MD  Type: Established Patient  Specialty: Interventional Pain Management  PCP: Danae Orleans, MD   Primary Reason for Visit: Interventional Pain Management Treatment. CC: Back Pain (low)  Procedure:          Anesthesia, Analgesia, Anxiolysis:  Type: Diagnostic Sacroiliac Joint Steroid Injection #1  Region: Superior Lumbosacral Region Level: PSIS (Posterior Superior Iliac Spine) Laterality: Left   Note: The patient was also experiencing some pain around the PSIS area.  Once I had injected the SI joint, I left 1 cc of the solution injected which I then diluted with 2 cc of lidocaine and injected that right into the area of the left PSIS, as I was removing the procedure needle used for the SI joint injection.  Type: Moderate (Conscious) Sedation combined with Local Anesthesia Indication(s): Analgesia and Anxiety Route: Intravenous (IV) IV Access: Secured Sedation: Meaningful verbal contact was maintained at all times during the procedure  Local Anesthetic: Lidocaine 1-2%  Position: Prone           Indications: 1. Chronic sacroiliac joint pain (Left)   2. Other spondylosis, sacral and sacrococcygeal region   3. Sacroiliac joint dysfunction (Left)   4. Somatic dysfunction of sacroiliac joint (Left)   5.  Enthesopathy of sacroiliac joint (Left)   6. Morbid (severe) obesity due to excess calories (HCC)    Pain Score: Pre-procedure: 3 /10 Post-procedure: 1 /10   Pre-op H&P Assessment:  Ms. Maschino is a 59 y.o. (year old), female patient, seen today for interventional treatment. She  has a past surgical history that includes necksurgery (10/21/2014); Back surgery (2013); Foot surgery (Right); Elbow surgery (Left); Carpal tunnel release (Right); Nose surgery; Partial hysterectomy; Total shoulder replacement (Bilateral); and Colonoscopy with propofol (N/A, 02/05/2017). Ms. Eaken has a current medication list which includes the following prescription(s): albuterol, cholecalciferol, citalopram, diclofenac sodium, docusate sodium, trelegy ellipta, guaifenesin, levothyroxine, loratadine, magnesium hydroxide, naloxone, nystatin, olopatadine hcl, oxycodone hcl, [START ON 07/15/2020] oxycodone hcl, tizanidine, and oxycodone hcl, and the following Facility-Administered Medications: fentanyl and midazolam. Her primarily concern today is the Back Pain (low)  Initial Vital Signs:  Pulse/HCG Rate: 92ECG Heart Rate: 88 Temp: (!) 96.7 F (35.9 C) Resp: 18 BP: (!) 120/91 SpO2: 97 %  BMI: Estimated body mass index is 41.6 kg/m as calculated from the following:   Height as of this encounter: 5\' 5"  (1.651 m).   Weight as of this encounter: 250 lb (113.4 kg).  Risk Assessment: Allergies: Reviewed. She is allergic to aripiprazole, duloxetine, gabapentin, gold, iodinated diagnostic agents, naproxen sodium, nortriptyline hcl, nsaids, pregabalin, meperidine, ace inhibitors, aspirin, cefpodoxime, fluoxetine, fluticasone-salmeterol, levofloxacin, lithium, paroxetine, tape, telithromycin, theophylline, topiramate, trazodone, triamcinolone, venlafaxine, bupropion, ketorolac tromethamine, morphine, and moxifloxacin.  Allergy Precautions: None required Coagulopathies: Reviewed. None identified.  Blood-thinner therapy: None  at this time Active Infection(s): Reviewed. None identified. Ms. Tooks is afebrile  Site Confirmation: Ms. Clowney was asked to confirm the procedure and laterality before  marking the site Procedure checklist: Completed Consent: Before the procedure and under the influence of no sedative(s), amnesic(s), or anxiolytics, the patient was informed of the treatment options, risks and possible complications. To fulfill our ethical and legal obligations, as recommended by the American Medical Association's Code of Ethics, I have informed the patient of my clinical impression; the nature and purpose of the treatment or procedure; the risks, benefits, and possible complications of the intervention; the alternatives, including doing nothing; the risk(s) and benefit(s) of the alternative treatment(s) or procedure(s); and the risk(s) and benefit(s) of doing nothing. The patient was provided information about the general risks and possible complications associated with the procedure. These may include, but are not limited to: failure to achieve desired goals, infection, bleeding, organ or nerve damage, allergic reactions, paralysis, and death. In addition, the patient was informed of those risks and complications associated to the procedure, such as failure to decrease pain; infection; bleeding; organ or nerve damage with subsequent damage to sensory, motor, and/or autonomic systems, resulting in permanent pain, numbness, and/or weakness of one or several areas of the body; allergic reactions; (i.e.: anaphylactic reaction); and/or death. Furthermore, the patient was informed of those risks and complications associated with the medications. These include, but are not limited to: allergic reactions (i.e.: anaphylactic or anaphylactoid reaction(s)); adrenal axis suppression; blood sugar elevation that in diabetics may result in ketoacidosis or comma; water retention that in patients with history of congestive heart  failure may result in shortness of breath, pulmonary edema, and decompensation with resultant heart failure; weight gain; swelling or edema; medication-induced neural toxicity; particulate matter embolism and blood vessel occlusion with resultant organ, and/or nervous system infarction; and/or aseptic necrosis of one or more joints. Finally, the patient was informed that Medicine is not an exact science; therefore, there is also the possibility of unforeseen or unpredictable risks and/or possible complications that may result in a catastrophic outcome. The patient indicated having understood very clearly. We have given the patient no guarantees and we have made no promises. Enough time was given to the patient to ask questions, all of which were answered to the patient's satisfaction. Ms. Tarr has indicated that she wanted to continue with the procedure. Attestation: I, the ordering provider, attest that I have discussed with the patient the benefits, risks, side-effects, alternatives, likelihood of achieving goals, and potential problems during recovery for the procedure that I have provided informed consent. Date  Time: 07/06/2020  9:30 AM  Pre-Procedure Preparation:  Monitoring: As per clinic protocol. Respiration, ETCO2, SpO2, BP, heart rate and rhythm monitor placed and checked for adequate function Safety Precautions: Patient was assessed for positional comfort and pressure points before starting the procedure. Time-out: I initiated and conducted the "Time-out" before starting the procedure, as per protocol. The patient was asked to participate by confirming the accuracy of the "Time Out" information. Verification of the correct person, site, and procedure were performed and confirmed by me, the nursing staff, and the patient. "Time-out" conducted as per Joint Commission's Universal Protocol (UP.01.01.01). Time: 1003  Description of Procedure:          Target Area: Superior, posterior, aspect  of the sacroiliac fissure Approach: Posterior, paraspinal, ipsilateral approach. Area Prepped: Entire Lower Lumbosacral Region DuraPrep (Iodine Povacrylex [0.7% available iodine] and Isopropyl Alcohol, 74% w/w) Safety Precautions: Aspiration looking for blood return was conducted prior to all injections. At no point did we inject any substances, as a needle was being advanced. No attempts were made at seeking  any paresthesias. Safe injection practices and needle disposal techniques used. Medications properly checked for expiration dates. SDV (single dose vial) medications used. Description of the Procedure: Protocol guidelines were followed. The patient was placed in position over the procedure table. The target area was identified and the area prepped in the usual manner. Skin & deeper tissues infiltrated with local anesthetic. Appropriate amount of time allowed to pass for local anesthetics to take effect. The procedure needle was advanced under fluoroscopic guidance into the sacroiliac joint until a firm endpoint was obtained. Proper needle placement secured. Negative aspiration confirmed. Solution injected in intermittent fashion, asking for systemic symptoms every 0.5cc of injectate. The needles were then removed and the area cleansed, making sure to leave some of the prepping solution back to take advantage of its long term bactericidal properties. Vitals:   07/06/20 1009 07/06/20 1014 07/06/20 1023 07/06/20 1033  BP: (!) 140/104 116/73 (!) 118/92 103/80  Pulse:      Resp: 18 17 13 16   Temp:  97.6 F (36.4 C)  97.9 F (36.6 C)  TempSrc:  Temporal  Temporal  SpO2: 95% 93% 96% 96%  Weight:      Height:        Start Time: 1003 hrs. End Time: 1009 hrs. Materials:  Needle(s) Type: Spinal Needle Gauge: 22G Length: 3.5-in Medication(s): Please see orders for medications and dosing details.  Imaging Guidance (Non-Spinal):          Type of Imaging Technique: Fluoroscopy Guidance  (Non-Spinal) Indication(s): Assistance in needle guidance and placement for procedures requiring needle placement in or near specific anatomical locations not easily accessible without such assistance. Exposure Time: Please see nurses notes. Contrast: Before injecting any contrast, we confirmed that the patient did not have an allergy to iodine, shellfish, or radiological contrast. Once satisfactory needle placement was completed at the desired level, radiological contrast was injected. Contrast injected under live fluoroscopy. No contrast complications. See chart for type and volume of contrast used. Fluoroscopic Guidance: I was personally present during the use of fluoroscopy. "Tunnel Vision Technique" used to obtain the best possible view of the target area. Parallax error corrected before commencing the procedure. "Direction-depth-direction" technique used to introduce the needle under continuous pulsed fluoroscopy. Once target was reached, antero-posterior, oblique, and lateral fluoroscopic projection used confirm needle placement in all planes. Images permanently stored in EMR. Interpretation: I personally interpreted the imaging intraoperatively. Adequate needle placement confirmed in multiple planes. Appropriate spread of contrast into desired area was observed. No evidence of afferent or efferent intravascular uptake. Permanent images saved into the patient's record.  Antibiotic Prophylaxis:   Anti-infectives (From admission, onward)   None     Indication(s): None identified  Post-operative Assessment:  Post-procedure Vital Signs:  Pulse/HCG Rate: 9283 Temp: 97.9 F (36.6 C) Resp: 16 BP: 103/80 SpO2: 96 %  EBL: None  Complications: No immediate post-treatment complications observed by team, or reported by patient.  Note: The patient tolerated the entire procedure well. A repeat set of vitals were taken after the procedure and the patient was kept under observation following  institutional policy, for this type of procedure. Post-procedural neurological assessment was performed, showing return to baseline, prior to discharge. The patient was provided with post-procedure discharge instructions, including a section on how to identify potential problems. Should any problems arise concerning this procedure, the patient was given instructions to immediately contact us, at any time, without hesitation. In any case, we plan to contact the patient by telephone for a follow-up  status report regarding this interventional procedure.  Comments:  No additional relevant information.  Before the patient was to leave the recovery area, she commented having some burning sensation in the tailbone area.  It would seem that she had a small sore over the area and the solution used to prep the filled might have leaked into this area where she had her skin somewhat raw, causing a burning sensation.  This was cleaned by the nursing staff.  Our staff cleaned the area and soon thereafter the burning sensation went away.  Plan of Care  Orders:  Orders Placed This Encounter  Procedures  . SACROILIAC JOINT INJECTION    Scheduling Instructions:     Side: Left-sided     Sedation: Patient's choice.     Timeframe: Today    Order Specific Question:   Where will this procedure be performed?    Answer:   [facility]  . DG PA[facility] C-ARM 1-60 MIN NO REPORT    Intraoperative interpretation by procedural physician at [place].    Standing Status:   Standing    Number of Occurrences:   1    Order Specific Question:   Reason for exam:    Answer:   Assistance in needle guidance and placement for procedures requiring needle placement in or near specific anatomical locations not easily accessible without such assistance.  . Informed Consent Details: Physician/Practitioner Attestation; Transcribe to consent form and obtain patient signature    Nursing Order: Transcribe to consent form  and obtain patient signature. Note: Always confirm laterality of pain with Ms. Domingo Cocking, before procedure.    Order Specific Question:   Physician/Practitioner attestation of informed consent for procedure/surgical case    Answer:   I, the physician/practitioner, attest that I have discussed with the patient the benefits, risks, side effects, alternatives, likelihood of achieving goals and potential problems during recovery for the procedure that I have provided informed consent.    Order Specific Question:   Procedure    Answer:   Sacroiliac Joint Block    Order Specific Question:   Physician/Practitioner performing the procedure    Answer:   Josselyn Harkins A. [name], MD    Order Specific Question:   Indication/Reason    Answer:   Chronic Low Back and Hip Pain secondary to Sacroiliac Joint Pain (Arthralgia/Arthropathy)  . Provide equipment / supplies at bedside    "Block Tray" (Disposable  single use) Needle type: SpinalSpinal Amount/quantity: 1 Size: Medium (5-inch) Gauge: 22G    Standing Status:   Standing    Number of Occurrences:   1    Order Specific Question:   Specify    Answer:   Block Tray  . Miscellanous precautions    Standing Status:   Standing    Number of Occurrences:   1   Chronic Opioid Analgesic:  Oxycodone IR 10 mg, 1 tab PO q 6 hrs (40 mg/dayof oxycodone) MME/day:60mg /day.   Medications ordered for procedure: Meds ordered this encounter  Medications  . lidocaine (XYLOCAINE) 2 % (with pres) injection 400 mg  . lactated ringers infusion 1,000 mL  . midazolam (VERSED) 5 MG/5ML injection 1-2 mg    Make sure Flumazenil is available in the pyxis when using this medication. If oversedation occurs, administer 0.2 mg IV over 15 sec. If after 45 sec no response, administer 0.2 mg again over 1 min; may repeat at 1 min intervals; not to exceed 4 doses (1 mg)  . fentaNYL (SUBLIMAZE) injection 25-50 mcg  Make sure Narcan is available in the pyxis when using this  medication. In the event of respiratory depression (RR< 8/min): Titrate NARCAN (naloxone) in increments of 0.1 to 0.2 mg IV at 2-3 minute intervals, until desired degree of reversal.  . ropivacaine (PF) 2 mg/mL (0.2%) (NAROPIN) injection 4 mL  . methylPREDNISolone acetate (DEPO-MEDROL) injection 80 mg   Medications administered: We administered lidocaine, lactated ringers, midazolam, fentaNYL, ropivacaine (PF) 2 mg/mL (0.2%), and methylPREDNISolone acetate.  See the medical record for exact dosing, route, and time of administration.  Follow-up plan:   Return in about 2 weeks (around 07/20/2020) for (F2F), (PP) Follow-up.      Considering: Possible bilateral hip jointRFA Diagnostic caudal ESI + epidurogram  Possible Racz procedure.  Possible bilateral cervical facetRFA Diagnostic right-sided CESI  Diagnostic left sacroiliac joint block Possible bilateral sacroiliac joint RFA   Palliative PRN treatment(s): Palliative bilateral trochanteric bursa #2  Palliative bilateralsuprascapular nerve block #3 Palliative bilateral suprascapular nerveRFA #2(last done on 05/13/2019) Diagnostic right IA hip joint injection #4 (last done 06/01/2020)  Diagnostic left IA hip joint injection #3 (last done 04/30/2018)  Diagnostic bilateral lumbar facet block #4 Palliative right lumbar facet RFA #3(last done 12/16/2017)  Palliative left lumbar facet RFA #3(last done 02/12/2018)  Diagnostic bilateral cervical facet block #2 Diagnostic right SI joint block #2    Recent Visits Date Type Provider Dept  06/21/20 Office Visit Milinda Pointer, MD [facility]  06/01/20 Procedure visit Milinda Pointer, MD [facility]  05/15/20 Office Visit Milinda Pointer, MD [facility]  Showing recent visits within past 90 days and meeting all other requirements Today's Visits Date Type Provider Dept  07/06/20 Procedure visit Milinda Pointer, MD [facility]  Showing today's visits and meeting all other requirements Future Appointments Date Type Provider Dept  07/20/20 Appointment Milinda Pointer, MD [facility]  08/09/20 Appointment Milinda Pointer, MD [facility]  Showing future appointments within next 90 days and meeting all other requirements  Disposition: Discharge home  Discharge (Date  Time): 07/06/2020; 1041 hrs.   Primary Care Physician: Danae Orleans, MD Location: [facility] Note by: Gaspar Cola, MD Date: 07/06/2020; Time: 11:16 AM  Disclaimer:  Medicine is not an exact science. The only guarantee in medicine is that nothing is guaranteed. It is important to note that the decision to proceed with this intervention was based on the information collected from the patient. The Data and conclusions were drawn from the patient's questionnaire, the interview, and the physical examination. Because the information was provided in large part by the patient, it cannot be guaranteed that it has not been purposely or unconsciously manipulated. Every effort has been made to obtain as much relevant data as possible for this evaluation. It is important to note that the conclusions that lead to this procedure are derived in large part from the available data. Always take into account that the treatment will also be dependent on availability of resources and existing treatment guidelines, considered by other Pain Management Practitioners as being common knowledge and practice, at the time of the intervention. For Medico-Legal purposes, it is also important to point out that variation in procedural techniques and pharmacological choices are the acceptable norm. The indications, contraindications, technique, and results of the above procedure should only be interpreted and judged by a Board-Certified Interventional Pain Specialist with extensive familiarity and expertise in the same exact  procedure and technique.

## 2020-07-06 NOTE — Patient Instructions (Signed)

## 2020-07-06 NOTE — Progress Notes (Signed)
Safety precautions to be maintained throughout the outpatient stay will include: orient to surroundings, keep bed in low position, maintain call bell within reach at all times, provide assistance with transfer out of bed and ambulation.  

## 2020-07-07 ENCOUNTER — Telehealth: Payer: Self-pay

## 2020-07-07 NOTE — Telephone Encounter (Signed)
Post procedure phone call.  Patient states she is doing well.  

## 2020-07-20 ENCOUNTER — Other Ambulatory Visit: Payer: Self-pay

## 2020-07-20 ENCOUNTER — Ambulatory Visit: Payer: Medicaid Other | Attending: Pain Medicine | Admitting: Pain Medicine

## 2020-07-20 ENCOUNTER — Ambulatory Visit: Payer: Medicaid Other | Admitting: Pain Medicine

## 2020-07-20 ENCOUNTER — Encounter: Payer: Self-pay | Admitting: Pain Medicine

## 2020-07-20 DIAGNOSIS — Z96611 Presence of right artificial shoulder joint: Secondary | ICD-10-CM

## 2020-07-20 DIAGNOSIS — Z96619 Presence of unspecified artificial shoulder joint: Secondary | ICD-10-CM

## 2020-07-20 DIAGNOSIS — G8929 Other chronic pain: Secondary | ICD-10-CM

## 2020-07-20 DIAGNOSIS — M19011 Primary osteoarthritis, right shoulder: Secondary | ICD-10-CM

## 2020-07-20 DIAGNOSIS — M25551 Pain in right hip: Secondary | ICD-10-CM

## 2020-07-20 DIAGNOSIS — M19012 Primary osteoarthritis, left shoulder: Secondary | ICD-10-CM

## 2020-07-20 DIAGNOSIS — M25511 Pain in right shoulder: Secondary | ICD-10-CM

## 2020-07-20 DIAGNOSIS — M533 Sacrococcygeal disorders, not elsewhere classified: Secondary | ICD-10-CM | POA: Diagnosis not present

## 2020-07-20 DIAGNOSIS — G894 Chronic pain syndrome: Secondary | ICD-10-CM | POA: Diagnosis not present

## 2020-07-20 DIAGNOSIS — Z96612 Presence of left artificial shoulder joint: Secondary | ICD-10-CM

## 2020-07-20 DIAGNOSIS — M25512 Pain in left shoulder: Secondary | ICD-10-CM

## 2020-07-20 DIAGNOSIS — M25519 Pain in unspecified shoulder: Secondary | ICD-10-CM

## 2020-07-20 MED ORDER — OXYCODONE HCL 10 MG PO TABS: 10.0000 mg | ORAL_TABLET | Freq: Four times a day (QID) | ORAL | 0 refills | Status: DC | PRN

## 2020-07-20 NOTE — Progress Notes (Signed)
Patient: Danielle [name]  Service Category: E/M  Provider: Gaspar Cola, MD  DOB: 1960/10/06  DOS: 07/20/2020  Location: Office  MRN: [id number]  Setting: Ambulatory outpatient  Referring Provider: Danae Orleans, MD  Type: Established Patient  Specialty: Interventional Pain Management  PCP: Danae Orleans, MD  Location: Remote location  Delivery: TeleHealth     Virtual Encounter - Pain Management PROVIDER NOTE: Information contained herein reflects review and annotations entered in association with encounter. Interpretation of such information and data should be left to medically-trained personnel. Information provided to patient can be located elsewhere in the medical record under "Patient Instructions". Document created using STT-dictation technology, any transcriptional errors that may result from process are unintentional.    Contact & Pharmacy Preferred: [phone] Home: [phone] (home) Mobile: [phone] (mobile) E-mail: [email or web address]  [place], [place] [address] [place] [address] Phone: [phone] Fax: [phone]   Pre-screening  Danielle [name] offered "in-person" vs "virtual" encounter. She indicated preferring virtual for this encounter.   Reason COVID-19*  Social distancing based on CDC and AMA recommendations.   I contacted Danielle [name] on 07/20/2020 via telephone.      I clearly identified myself as Gaspar Cola, MD. I verified that I was speaking with the correct person using two identifiers (Name: Danielle [name], and date of birth: 1961-04-02).  Consent I sought verbal advanced consent from Danielle [name] for virtual visit interactions. I informed Danielle [name] of possible security and privacy concerns, risks, and limitations associated with providing "not-in-person" medical evaluation and management services. I also informed Danielle [name] of the availability of "in-person" appointments. Finally,  I informed her that there would be a charge for the virtual visit and that she could be  personally, fully or partially, financially responsible for it. Danielle [name] expressed understanding and agreed to proceed.   Historic Elements   Danielle [name] is a 60 y.o. year old, female patient evaluated today after our last contact on 07/06/2020. Danielle [name]  has a past medical history of Acute postoperative pain (01/13/2017), Anxiety, Arthritis, Asthma, Bell's palsy, Bursitis, COPD (chronic obstructive pulmonary disease) ([place]), Depression, Fibromyalgia, Heart murmur, Hepatitis C, Hiatal hernia, Hyperlipidemia, Hypertension, IBS (irritable bowel syndrome), Insomnia, and Thyroid disease. She also  has a past surgical history that includes necksurgery (10/21/2014); Back surgery (2013); Foot surgery (Right); Elbow surgery (Left); Carpal tunnel release (Right); Nose surgery; Partial hysterectomy; Total shoulder replacement (Bilateral); and Colonoscopy with propofol (N/A, 02/05/2017). Danielle [name] has a current medication list which includes the following prescription(s): albuterol, cetirizine, cholecalciferol, citalopram, diclofenac sodium, docusate sodium, trelegy ellipta, guaifenesin, levothyroxine, magnesium hydroxide, naloxone, nystatin, olopatadine hcl, tizanidine, and [START ON 08/14/2020] oxycodone hcl. She  reports that she has been smoking cigarettes. She has a 80.00 pack-year smoking history. She has never used smokeless tobacco. She reports that she does not drink alcohol and does not use drugs. Danielle [name] is allergic to aripiprazole, duloxetine, gabapentin, gold, iodinated diagnostic agents, naproxen sodium, nortriptyline hcl, nsaids, pregabalin, meperidine, ace inhibitors, aspirin, cefpodoxime, fluoxetine, fluticasone-salmeterol, levofloxacin, lithium, paroxetine, tape, telithromycin, theophylline, topiramate, trazodone, triamcinolone, venlafaxine, bupropion, ketorolac tromethamine, morphine, and moxifloxacin.    HPI  Today, she is being contacted for a post-procedure assessment.  The patient was originally scheduled to come in for a face-to-face evaluation, but some of her family members ended up testing positive for COVID-19 and she called and switch her appointment to a virtual visit.  The patient  refers that she is doing great after the left SI joint injection however, she is now experiencing more pain in the shoulders.  She describes that the shoulder pain is bilateral but with the left being worse than the right.  She would like to have those injected.  However, she is pending to go to have her COVID booster tomorrow and therefore we need to schedule it at least 2 weeks from that.  Post-Procedure Evaluation  Procedure (07/06/2020): Diagnostic left sacroiliac joint block under fluoroscopic guidance and IV sedation Pre-procedure pain level: 3/10 Post-procedure: 1/10 (> 50% relief)  Sedation: Sedation provided.  Effectiveness during initial hour after procedure(Ultra-Short Term Relief): 100 %.  Local anesthetic used: Long-acting (4-6 hours) Effectiveness: Defined as any analgesic benefit obtained secondary to the administration of local anesthetics. This carries significant diagnostic value as to the etiological location, or anatomical origin, of the pain. Duration of benefit is expected to coincide with the duration of the local anesthetic used.  Effectiveness during initial 4-6 hours after procedure(Short-Term Relief): 100 %.  Long-term benefit: Defined as any relief past the pharmacologic duration of the local anesthetics.  Effectiveness past the initial 6 hours after procedure(Long-Term Relief): 85 %.  Current benefits: Defined as benefit that persist at this time.   Analgesia:  >75% relief Function: Danielle [name] reports improvement in function ROM: Danielle [name] reports improvement in ROM  Pharmacotherapy Assessment  Analgesic: Oxycodone IR 10 mg, 1 tab PO q 6 hrs (40 mg/dayof  oxycodone) MME/day:30m/day.   Monitoring: [place] PMP: PDMP not reviewed this encounter.       Pharmacotherapy: No side-effects or adverse reactions reported. Compliance: No problems identified. Effectiveness: Clinically acceptable. Plan: Refer to "POC".  UDS:  Summary  Date Value Ref Range Status  01/13/2020 Note  Final    Comment:    ==================================================================== [employer] [facility] 13 (MW) ==================================================================== Test                             Result       Flag       Units  Drug Present and Declared for Prescription Verification   Oxycodone                      >4587        EXPECTED   ng/mg creat   Oxymorphone                    319          EXPECTED   ng/mg creat   Noroxycodone                   >4587        EXPECTED   ng/mg creat   Noroxymorphone                 79           EXPECTED   ng/mg creat    Sources of oxycodone are scheduled prescription medications.    Oxymorphone, noroxycodone, and noroxymorphone are expected    metabolites of oxycodone. Oxymorphone is also available as a    scheduled prescription medication.  ==================================================================== Test                      Result    Flag   Units      Ref Range   Creatinine  218              mg/dL      >=20 ==================================================================== Declared Medications:  The flagging and interpretation on this report are based on the  following declared medications.  Unexpected results may arise from  inaccuracies in the declared medications.   **Note: The testing scope of this panel includes these medications:   Oxycodone   **Note: The testing scope of this panel does not include the  following reported medications:   Albuterol (Ventolin HFA)  Citalopram (Celexa)  Diclofenac (Voltaren)  Docusate (Colace)  Fexofenadine (Allegra-D)  Fluticasone  (Advair)  Guaifenesin (Mucinex)  Levothyroxine (Synthroid)  Magnesium (Milk of Magnesia)  Naloxone (Narcan)  Nystatin (Nystop)  Pseudoephedrine (Allegra-D)  Salmeterol (Advair)  Tiotropium (Spiriva)  Tizanidine (Zanaflex)  Varenicline (Chantix)  Vitamin D ==================================================================== For clinical consultation, please call [phone]. ====================================================================     Laboratory Chemistry Profile   Renal Lab Results  Component Value Date   BUN 14 04/28/2019   CREATININE 0.98 04/28/2019   BCR 13 02/16/2019   GFRAA >60 04/28/2019   GFRNONAA >60 04/28/2019     Hepatic Lab Results  Component Value Date   AST 18 04/28/2019   ALT 16 04/28/2019   ALBUMIN 3.7 04/28/2019   ALKPHOS 80 04/28/2019     Electrolytes Lab Results  Component Value Date   NA 139 04/28/2019   K 4.1 04/28/2019   CL 105 04/28/2019   CALCIUM 9.3 04/28/2019   MG 2.1 02/16/2019     Bone Lab Results  Component Value Date   25OHVITD1 27 (L) 02/16/2019   25OHVITD2 2.0 02/16/2019   25OHVITD3 25 02/16/2019     Inflammation (CRP: Acute Phase) (ESR: Chronic Phase) Lab Results  Component Value Date   CRP 2 02/16/2019   ESRSEDRATE 23 02/16/2019       Note: Above Lab results reviewed.  Imaging  DG PAIN CLINIC C-ARM 1-60 MIN NO REPORT Fluoro was used, but no Radiologist interpretation will be provided.  Please refer to "NOTES" tab for provider progress note.  Assessment  The primary encounter diagnosis was Chronic pain syndrome. Diagnoses of Chronic sacroiliac joint pain (Left), Chronic hip pain (Right), Chronic shoulder pain (1ry area of Pain) (Bilateral) (R>L), Chronic shoulder pain after replacement, History of total shoulder replacement (Left), History of total shoulder replacement (Right), and Osteoarthritis of shoulder (Bilateral) were also pertinent to this visit.  Plan of Care  Problem-specific:  No  problem-specific Assessment & Plan notes found for this encounter.  Danielle [name] has a current medication list which includes the following long-term medication(s): albuterol, citalopram, diclofenac sodium, levothyroxine, olopatadine hcl, tizanidine, and [START ON 08/14/2020] oxycodone hcl.  Pharmacotherapy (Medications Ordered): Meds ordered this encounter  Medications  . Oxycodone HCl 10 MG TABS    Sig: Take 1 tablet (10 mg total) by mouth every 6 (six) hours as needed. Must last 30 days    Dispense:  120 tablet    Refill:  0    Chronic Pain: STOP Act (Not applicable) Fill 1 day early if closed on refill date. Avoid benzodiazepines within 8 hours of opioids   Orders:  Orders Placed This Encounter  Procedures  . SUPRASCAPULAR NERVE BLOCK    Standing Status:   Future    Standing Expiration Date:   09/17/2020    Scheduling Instructions:     Bilateral suprascapular nerve block under fluoroscopic guidance and IV sedation    Order Specific Question:   Where will this procedure  be performed?    Answer:   [facility]   Follow-up plan:   Return in about 8 weeks (around 09/13/2020) for (F2F), (Med Mgmt), in addition, Procedure (w/ sedation): (B) suprascapular nerve block.      Considering: Possible bilateral hip jointRFA Diagnostic caudal ESI + epidurogram  Possible Racz procedure.  Possible bilateral cervical facetRFA Diagnostic right-sided CESI  Diagnostic left sacroiliac joint block Possible bilateral sacroiliac joint RFA   Palliative PRN treatment(s): Palliative bilateral trochanteric bursa #2  Palliative bilateralsuprascapular nerve block #3 Palliative bilateral suprascapular nerveRFA #2(last done on 05/13/2019) Diagnostic right IA hip joint injection #4 (last done 06/01/2020)  Diagnostic left IA hip joint injection #3 (last done 04/30/2018)  Diagnostic bilateral lumbar facet block #4 Palliative right lumbar facet RFA #3(last done 12/16/2017)   Palliative left lumbar facet RFA #3(last done 02/12/2018)  Diagnostic bilateral cervical facet block #2 Diagnostic right SI joint block #2    Recent Visits Date Type Provider Dept  07/06/20 Procedure visit Milinda Pointer, MD [facility]  06/21/20 Office Visit Milinda Pointer, MD [facility]  06/01/20 Procedure visit Milinda Pointer, MD [facility]  05/15/20 Office Visit Milinda Pointer, MD [facility]  Showing recent visits within past 90 days and meeting all other requirements Today's Visits Date Type Provider Dept  07/20/20 Telemedicine Milinda Pointer, MD [facility]  Showing today's visits and meeting all other requirements Future Appointments Date Type Provider Dept  08/09/20 Appointment Milinda Pointer, MD [facility]  Showing future appointments within next 90 days and meeting all other requirements  I discussed the assessment and treatment plan with the patient. The patient was provided an opportunity to ask questions and all were answered. The patient agreed with the plan and demonstrated an understanding of the instructions.  Patient advised to call back or seek an in-person evaluation if the symptoms or condition worsens.  Duration of encounter: 15 minutes.  Note by: Gaspar Cola, MD Date: 07/20/2020; Time: 3:23 PM

## 2020-07-20 NOTE — Patient Instructions (Signed)
____________________________________________________________________________________________  Preparing for Procedure with Sedation  Procedure appointments are limited to planned procedures: . No Prescription Refills. . No disability issues will be discussed. . No medication changes will be discussed.  Instructions: . Oral Intake: Do not eat or drink anything for at least 8 hours prior to your procedure. (Exception: Blood Pressure Medication. See below.) . Transportation: Unless otherwise stated by your physician, you may drive yourself after the procedure. . Blood Pressure Medicine: Do not forget to take your blood pressure medicine with a sip of water the morning of the procedure. If your Diastolic (lower reading)is above 100 mmHg, elective cases will be cancelled/rescheduled. . Blood thinners: These will need to be stopped for procedures. Notify our staff if you are taking any blood thinners. Depending on which one you take, there will be specific instructions on how and when to stop it. . Diabetics on insulin: Notify the staff so that you can be scheduled 1st case in the morning. If your diabetes requires high dose insulin, take only  of your normal insulin dose the morning of the procedure and notify the staff that you have done so. . Preventing infections: Shower with an antibacterial soap the morning of your procedure. . Build-up your immune system: Take 1000 mg of Vitamin C with every meal (3 times a day) the day prior to your procedure. . Antibiotics: Inform the staff if you have a condition or reason that requires you to take antibiotics before dental procedures. . Pregnancy: If you are pregnant, call and cancel the procedure. . Sickness: If you have a cold, fever, or any active infections, call and cancel the procedure. . Arrival: You must be in the facility at least 30 minutes prior to your scheduled procedure. . Children: Do not bring children with you. . Dress appropriately:  Bring dark clothing that you would not mind if they get stained. . Valuables: Do not bring any jewelry or valuables.  Reasons to call and reschedule or cancel your procedure: (Following these recommendations will minimize the risk of a serious complication.) . Surgeries: Avoid having procedures within 2 weeks of any surgery. (Avoid for 2 weeks before or after any surgery). . Flu Shots: Avoid having procedures within 2 weeks of a flu shots or . (Avoid for 2 weeks before or after immunizations). . Barium: Avoid having a procedure within 7-10 days after having had a radiological study involving the use of radiological contrast. (Myelograms, Barium swallow or enema study). . Heart attacks: Avoid any elective procedures or surgeries for the initial 6 months after a "Myocardial Infarction" (Heart Attack). . Blood thinners: It is imperative that you stop these medications before procedures. Let us know if you if you take any blood thinner.  . Infection: Avoid procedures during or within two weeks of an infection (including chest colds or gastrointestinal problems). Symptoms associated with infections include: Localized redness, fever, chills, night sweats or profuse sweating, burning sensation when voiding, cough, congestion, stuffiness, runny nose, sore throat, diarrhea, nausea, vomiting, cold or Flu symptoms, recent or current infections. It is specially important if the infection is over the area that we intend to treat. . Heart and lung problems: Symptoms that may suggest an active cardiopulmonary problem include: cough, chest pain, breathing difficulties or shortness of breath, dizziness, ankle swelling, uncontrolled high or unusually low blood pressure, and/or palpitations. If you are experiencing any of these symptoms, cancel your procedure and contact your primary care physician for an evaluation.  Remember:  Regular Business hours are:    Monday to Thursday 8:00 AM to 4:00 PM  Provider's  Schedule: Yalanda Soderman, MD:  Procedure days: Tuesday and Thursday 7:30 AM to 4:00 PM  Bilal Lateef, MD:  Procedure days: Monday and Wednesday 7:30 AM to 4:00 PM ____________________________________________________________________________________________    

## 2020-08-08 ENCOUNTER — Ambulatory Visit: Payer: Medicaid Other | Admitting: Pain Medicine

## 2020-08-08 ENCOUNTER — Other Ambulatory Visit: Payer: Self-pay

## 2020-08-08 ENCOUNTER — Encounter: Payer: Self-pay | Admitting: Pain Medicine

## 2020-08-08 ENCOUNTER — Ambulatory Visit
Admission: RE | Admit: 2020-08-08 | Discharge: 2020-08-08 | Disposition: A | Discharge status: Home / Self Care | Payer: Medicaid Other | Source: Ambulatory Visit | Attending: Pain Medicine | Admitting: Pain Medicine

## 2020-08-08 VITALS — BP 101/85 | HR 102 | Temp 98.8°F | Resp 20 | Ht 65.0 in | Wt 248.0 lb

## 2020-08-08 DIAGNOSIS — M25512 Pain in left shoulder: Secondary | ICD-10-CM

## 2020-08-08 DIAGNOSIS — M25519 Pain in unspecified shoulder: Secondary | ICD-10-CM | POA: Insufficient documentation

## 2020-08-08 DIAGNOSIS — M19012 Primary osteoarthritis, left shoulder: Secondary | ICD-10-CM | POA: Diagnosis present

## 2020-08-08 DIAGNOSIS — M25511 Pain in right shoulder: Secondary | ICD-10-CM | POA: Insufficient documentation

## 2020-08-08 DIAGNOSIS — G8929 Other chronic pain: Secondary | ICD-10-CM | POA: Diagnosis not present

## 2020-08-08 DIAGNOSIS — M19011 Primary osteoarthritis, right shoulder: Secondary | ICD-10-CM

## 2020-08-08 DIAGNOSIS — Z96619 Presence of unspecified artificial shoulder joint: Secondary | ICD-10-CM | POA: Diagnosis present

## 2020-08-08 MED ORDER — FENTANYL CITRATE (PF) 100 MCG/2ML IJ SOLN
INTRAMUSCULAR | Status: AC
  Filled 2020-08-08: qty 2

## 2020-08-08 MED ORDER — METHYLPREDNISOLONE ACETATE 80 MG/ML IJ SUSP
80.0000 mg | Freq: Once | INTRAMUSCULAR | Status: AC
  Administered 2020-08-08: 80 mg

## 2020-08-08 MED ORDER — ROPIVACAINE HCL 2 MG/ML IJ SOLN
INTRAMUSCULAR | Status: AC
  Filled 2020-08-08: qty 10

## 2020-08-08 MED ORDER — LIDOCAINE HCL 2 % IJ SOLN
20.0000 mL | Freq: Once | INTRAMUSCULAR | Status: AC
  Administered 2020-08-08: 100 mg
  Filled 2020-08-08: qty 20

## 2020-08-08 MED ORDER — MIDAZOLAM HCL 5 MG/5ML IJ SOLN
1.0000 mg | INTRAMUSCULAR | Status: DC | PRN
  Administered 2020-08-08: 2 mg via INTRAVENOUS

## 2020-08-08 MED ORDER — METHYLPREDNISOLONE ACETATE 80 MG/ML IJ SUSP
INTRAMUSCULAR | Status: AC
  Filled 2020-08-08: qty 1

## 2020-08-08 MED ORDER — FENTANYL CITRATE (PF) 100 MCG/2ML IJ SOLN
25.0000 ug | INTRAMUSCULAR | Status: DC | PRN
  Administered 2020-08-08: 50 ug via INTRAVENOUS

## 2020-08-08 MED ORDER — ROPIVACAINE HCL 2 MG/ML IJ SOLN
9.0000 mL | Freq: Once | INTRAMUSCULAR | Status: AC
  Administered 2020-08-08: 9 mL via PERINEURAL

## 2020-08-08 MED ORDER — MIDAZOLAM HCL 5 MG/5ML IJ SOLN
INTRAMUSCULAR | Status: AC
  Filled 2020-08-08: qty 5

## 2020-08-08 MED ORDER — LACTATED RINGERS IV SOLN
1000.0000 mL | Freq: Once | INTRAVENOUS | Status: AC
  Administered 2020-08-08: 1000 mL via INTRAVENOUS

## 2020-08-08 NOTE — Progress Notes (Addendum)
PROVIDER NOTE: Information contained herein reflects review and annotations entered in association with encounter. Interpretation of such information and data should be left to medically-trained personnel. Information provided to patient can be located elsewhere in the medical record under "Patient Instructions". Document created using STT-dictation technology, any transcriptional errors that may result from process are unintentional.    Patient: Danielle [name]  Service Category: Procedure  Provider: Gaspar Cola, MD  DOB: 09-27-1960  DOS: 08/08/2020  Location: [facility]  MRN: [id number]  Setting: Ambulatory - outpatient  Referring Provider: Danae Orleans, MD  Type: Established Patient  Specialty: Interventional Pain Management  PCP: Danae Orleans, MD   Primary Reason for Visit: Interventional Pain Management Treatment. CC: Shoulder Pain (bilateral)  Procedure:          Anesthesia, Analgesia, Anxiolysis:  Type: Diagnostic Suprascapular nerve Block #3  Primary Purpose: Diagnostic Region: Posterior Shoulder & Scapular Areas Level: Superior to the scapular spine, in the lateral aspect of the supraspinatus fossa (Suprescapular notch). Target Area: Suprascapular nerve as it passes thru the lower portion of the suprascapular notch. Approach: Posterior percutaneous approach. Laterality: Bilateral  Type: Moderate (Conscious) Sedation combined with Local Anesthesia Indication(s): Analgesia and Anxiety Route: Intravenous (IV) IV Access: Secured Sedation: Meaningful verbal contact was maintained at all times during the procedure  Local Anesthetic: Lidocaine 1-2%  Position: Prone   Indications: 1. Chronic shoulder pain (1ry area of Pain) (Bilateral) (R>L)   2. Chronic shoulder pain after replacement   3. Osteoarthritis of shoulder (Bilateral)    Pain Score: Pre-procedure: 4 /10 Post-procedure: 0-No pain/10   Pre-op H&P Assessment:  Danielle [name] is a 60 y.o. (year  old), female patient, seen today for interventional treatment. She  has a past surgical history that includes necksurgery (10/21/2014); Back surgery (2013); Foot surgery (Right); Elbow surgery (Left); Carpal tunnel release (Right); Nose surgery; Partial hysterectomy; Total shoulder replacement (Bilateral); and Colonoscopy with propofol (N/A, 02/05/2017). Danielle [name] has a current medication list which includes the following prescription(s): albuterol, cetirizine, cholecalciferol, citalopram, diclofenac sodium, docusate sodium, trelegy ellipta, guaifenesin, levothyroxine, magnesium hydroxide, naloxone, nystatin, olopatadine hcl, [START ON 08/14/2020] oxycodone hcl, and tizanidine, and the following Facility-Administered Medications: fentanyl and midazolam. Her primarily concern today is the Shoulder Pain (bilateral)  Initial Vital Signs:  Pulse/HCG Rate: (!) 102ECG Heart Rate: 93 Temp: (!) 97.1 F (36.2 C) Resp: 18 BP: 102/73 SpO2: 97 %  BMI: Estimated body mass index is 41.27 kg/m as calculated from the following:   Height as of this encounter: 5\' 5"  (1.651 m).   Weight as of this encounter: 248 lb (112.5 kg).  Risk Assessment: Allergies: Reviewed. She is allergic to aripiprazole, duloxetine, gabapentin, gold, iodinated diagnostic agents, naproxen sodium, nortriptyline hcl, nsaids, pregabalin, meperidine, ace inhibitors, aspirin, cefpodoxime, fluoxetine, fluticasone-salmeterol, levofloxacin, lithium, paroxetine, tape, telithromycin, theophylline, topiramate, trazodone, triamcinolone, venlafaxine, bupropion, ketorolac tromethamine, morphine, and moxifloxacin.  Allergy Precautions: None required Coagulopathies: Reviewed. None identified.  Blood-thinner therapy: None at this time Active Infection(s): Reviewed. None identified. Danielle [name] is afebrile  Site Confirmation: Danielle [name] was asked to confirm the procedure and laterality before marking the site Procedure checklist: Completed Consent:  Before the procedure and under the influence of no sedative(s), amnesic(s), or anxiolytics, the patient was informed of the treatment options, risks and possible complications. To fulfill our ethical and legal obligations, as recommended by the American Medical Association's Code of Ethics, I have informed the patient of my clinical impression; the nature and purpose of the treatment or procedure;  the risks, benefits, and possible complications of the intervention; the alternatives, including doing nothing; the risk(s) and benefit(s) of the alternative treatment(s) or procedure(s); and the risk(s) and benefit(s) of doing nothing. The patient was provided information about the general risks and possible complications associated with the procedure. These may include, but are not limited to: failure to achieve desired goals, infection, bleeding, organ or nerve damage, allergic reactions, paralysis, and death. In addition, the patient was informed of those risks and complications associated to the procedure, such as failure to decrease pain; infection; bleeding; organ or nerve damage with subsequent damage to sensory, motor, and/or autonomic systems, resulting in permanent pain, numbness, and/or weakness of one or several areas of the body; allergic reactions; (i.e.: anaphylactic reaction); and/or death. Furthermore, the patient was informed of those risks and complications associated with the medications. These include, but are not limited to: allergic reactions (i.e.: anaphylactic or anaphylactoid reaction(s)); adrenal axis suppression; blood sugar elevation that in diabetics may result in ketoacidosis or comma; water retention that in patients with history of congestive heart failure may result in shortness of breath, pulmonary edema, and decompensation with resultant heart failure; weight gain; swelling or edema; medication-induced neural toxicity; particulate matter embolism and blood vessel occlusion with  resultant organ, and/or nervous system infarction; and/or aseptic necrosis of one or more joints. Finally, the patient was informed that Medicine is not an exact science; therefore, there is also the possibility of unforeseen or unpredictable risks and/or possible complications that may result in a catastrophic outcome. The patient indicated having understood very clearly. We have given the patient no guarantees and we have made no promises. Enough time was given to the patient to ask questions, all of which were answered to the patient's satisfaction. Ms. Eiben has indicated that she wanted to continue with the procedure. Attestation: I, the ordering provider, attest that I have discussed with the patient the benefits, risks, side-effects, alternatives, likelihood of achieving goals, and potential problems during recovery for the procedure that I have provided informed consent. Date  Time: 08/08/2020 10:26 AM  Pre-Procedure Preparation:  Monitoring: As per clinic protocol. Respiration, ETCO2, SpO2, BP, heart rate and rhythm monitor placed and checked for adequate function Safety Precautions: Patient was assessed for positional comfort and pressure points before starting the procedure. Time-out: I initiated and conducted the "Time-out" before starting the procedure, as per protocol. The patient was asked to participate by confirming the accuracy of the "Time Out" information. Verification of the correct person, site, and procedure were performed and confirmed by me, the nursing staff, and the patient. "Time-out" conducted as per Joint Commission's Universal Protocol (UP.01.01.01). Time: 1137  Description of Procedure:          Area Prepped: Entire shoulder Area DuraPrep (Iodine Povacrylex [0.7% available iodine] and Isopropyl Alcohol, 74% w/w) Safety Precautions: Aspiration looking for blood return was conducted prior to all injections. At no point did we inject any substances, as a needle was being  advanced. No attempts were made at seeking any paresthesias. Safe injection practices and needle disposal techniques used. Medications properly checked for expiration dates. SDV (single dose vial) medications used. Description of the Procedure: Protocol guidelines were followed. The patient was placed in position over the procedure table. The target area was identified and the area prepped in the usual manner. Skin & deeper tissues infiltrated with local anesthetic. Appropriate amount of time allowed to pass for local anesthetics to take effect. The procedure needles were then advanced to the target  area. Proper needle placement secured. Negative aspiration confirmed. Solution injected in intermittent fashion, asking for systemic symptoms every 0.5cc of injectate. The needles were then removed and the area cleansed, making sure to leave some of the prepping solution back to take advantage of its long term bactericidal properties.  Vitals:   08/08/20 1140 08/08/20 1150 08/08/20 1200 08/08/20 1210  BP: 96/82 97/85 101/83 101/85  Pulse:      Resp: 16 (!) 21 18 20   Temp:  (!) 97.1 F (36.2 C)  98.8 F (37.1 C)  SpO2: 95% 96% 96% 97%  Weight:      Height:        Start Time: 1137 hrs. End Time: 1140 hrs. Materials:  Needle(s) Type: Spinal Needle Gauge: 22G Length: 3.5-in Medication(s): Please see orders for medications and dosing details.  Imaging Guidance (Non-Spinal):          Type of Imaging Technique: Fluoroscopy Guidance (Non-Spinal) Indication(s): Assistance in needle guidance and placement for procedures requiring needle placement in or near specific anatomical locations not easily accessible without such assistance. Exposure Time: Please see nurses notes. Contrast: Before injecting any contrast, we confirmed that the patient did not have an allergy to iodine, shellfish, or radiological contrast. Once satisfactory needle placement was completed at the desired level, radiological contrast  was injected. Contrast injected under live fluoroscopy. No contrast complications. See chart for type and volume of contrast used. Fluoroscopic Guidance: I was personally present during the use of fluoroscopy. "Tunnel Vision Technique" used to obtain the best possible view of the target area. Parallax error corrected before commencing the procedure. "Direction-depth-direction" technique used to introduce the needle under continuous pulsed fluoroscopy. Once target was reached, antero-posterior, oblique, and lateral fluoroscopic projection used confirm needle placement in all planes. Images permanently stored in EMR. Interpretation: I personally interpreted the imaging intraoperatively. Adequate needle placement confirmed in multiple planes. Appropriate spread of contrast into desired area was observed. No evidence of afferent or efferent intravascular uptake. Permanent images saved into the patient's record.  Antibiotic Prophylaxis:   Anti-infectives (From admission, onward)   None     Indication(s): None identified  Post-operative Assessment:  Post-procedure Vital Signs:  Pulse/HCG Rate: (!) 10291 Temp: 98.8 F (37.1 C) Resp: 20 BP: 101/85 SpO2: 97 %  EBL: None  Complications: No immediate post-treatment complications observed by team, or reported by patient.  Note: The patient tolerated the entire procedure well. A repeat set of vitals were taken after the procedure and the patient was kept under observation following institutional policy, for this type of procedure. Post-procedural neurological assessment was performed, showing return to baseline, prior to discharge. The patient was provided with post-procedure discharge instructions, including a section on how to identify potential problems. Should any problems arise concerning this procedure, the patient was given instructions to immediately contact us, at any time, without hesitation. In any case, we plan to contact the patient by  telephone for a follow-up status report regarding this interventional procedure.  Comments:  No additional relevant information.  Plan of Care  Orders:  Orders Placed This Encounter  Procedures  . SUPRASCAPULAR NERVE BLOCK    For shoulder pain.    Scheduling Instructions:     Purpose: Diagnostic     Laterality: Bilateral     Level(s): Suprascapular notch     Sedation: Patient's choice.     Scheduling Timeframe: Today    Order Specific Question:   Where will this procedure be performed?    Answer:  [facility]  . DG PAIN [facility] C-ARM 1-60 MIN NO REPORT    Intraoperative interpretation by procedural physician at [facility].    Standing Status:   Standing    Number of Occurrences:   1    Order Specific Question:   Reason for exam:    Answer:   Assistance in needle guidance and placement for procedures requiring needle placement in or near specific anatomical locations not easily accessible without such assistance.  . Informed Consent Details: Physician/Practitioner Attestation; Transcribe to consent form and obtain patient signature    Note: Always confirm laterality of pain with Ms. Domingo Cocking, before procedure.    Order Specific Question:   Physician/Practitioner attestation of informed consent for procedure/surgical case    Answer:   I, the physician/practitioner, attest that I have discussed with the patient the benefits, risks, side effects, alternatives, likelihood of achieving goals and potential problems during recovery for the procedure that I have provided informed consent.    Order Specific Question:   Procedure    Answer:   Suprascapular Nerve Block    Order Specific Question:   Physician/Practitioner performing the procedure    Answer:   Stamatia Masri A. [name], MD    Order Specific Question:   Indication/Reason    Answer:   Chronic shoulder pain (arthralgia)  . Provide equipment / supplies at bedside    "Block Tray" (Disposable  single use) Needle type:  SpinalSpinal Amount/quantity: 2 Size: Regular (3.5-inch) Gauge: 22G    Standing Status:   Standing    Number of Occurrences:   1    Order Specific Question:   Specify    Answer:   Block Tray   Chronic Opioid Analgesic:  Oxycodone IR 10 mg, 1 tab PO q 6 hrs (40 mg/dayof oxycodone) MME/day:60mg /day.   Medications ordered for procedure: Meds ordered this encounter  Medications  . lidocaine (XYLOCAINE) 2 % (with pres) injection 400 mg  . lactated ringers infusion 1,000 mL  . midazolam (VERSED) 5 MG/5ML injection 1-2 mg    Make sure Flumazenil is available in the pyxis when using this medication. If oversedation occurs, administer 0.2 mg IV over 15 sec. If after 45 sec no response, administer 0.2 mg again over 1 min; may repeat at 1 min intervals; not to exceed 4 doses (1 mg)  . fentaNYL (SUBLIMAZE) injection 25-50 mcg    Make sure Narcan is available in the pyxis when using this medication. In the event of respiratory depression (RR< 8/min): Titrate NARCAN (naloxone) in increments of 0.1 to 0.2 mg IV at 2-3 minute intervals, until desired degree of reversal.  . ropivacaine (PF) 2 mg/mL (0.2%) (NAROPIN) injection 9 mL  . methylPREDNISolone acetate (DEPO-MEDROL) injection 80 mg   Medications administered: We administered lidocaine, lactated ringers, midazolam, fentaNYL, ropivacaine (PF) 2 mg/mL (0.2%), and methylPREDNISolone acetate.  See the medical record for exact dosing, route, and time of administration.  Follow-up plan:   Return in about 2 weeks (around 08/22/2020) for (F2F), (PP) Follow-up.       Considering: Possible bilateral hip jointRFA Diagnostic caudal ESI + epidurogram  Possible Racz procedure.  Possible bilateral cervical facetRFA Diagnostic right-sided CESI  Diagnostic left sacroiliac joint block Possible bilateral sacroiliac joint RFA   Palliative PRN treatment(s): Palliative bilateral trochanteric bursa #2  Palliative bilateralsuprascapular nerve  block #3 Palliative bilateral suprascapular nerveRFA #2(last done on 05/13/2019) Diagnostic right IA hip joint injection #4 (last done 06/01/2020)  Diagnostic left IA hip joint injection #3 (last done  04/30/2018)  Diagnostic bilateral lumbar facet block #4 Palliative right lumbar facet RFA #3(last done 12/16/2017)  Palliative left lumbar facet RFA #3(last done 02/12/2018)  Diagnostic bilateral cervical facet block #2 Diagnostic right SI joint block #2     Recent Visits Date Type Provider Dept  07/20/20 Telemedicine Milinda Pointer, MD [facility]  07/06/20 Procedure visit Milinda Pointer, MD [facility]  06/21/20 Office Visit Milinda Pointer, MD [facility]  06/01/20 Procedure visit Milinda Pointer, MD [facility]  05/15/20 Office Visit Milinda Pointer, MD [facility]  Showing recent visits within past 90 days and meeting all other requirements Today's Visits Date Type Provider Dept  08/08/20 Procedure visit Milinda Pointer, MD [facility]  Showing today's visits and meeting all other requirements Future Appointments Date Type Provider Dept  09/11/20 Appointment Milinda Pointer, MD [facility]  Showing future appointments within next 90 days and meeting all other requirements  Disposition: Discharge home  Discharge (Date  Time): 08/08/2020; 1211 hrs.   Primary Care Physician: Danae Orleans, MD Location: [facility] Note by: Gaspar Cola, MD Date: 08/08/2020; Time: 3:40 PM  Disclaimer:  Medicine is not an Chief Strategy Officer. The only guarantee in medicine is that nothing is guaranteed. It is important to note that the decision to proceed with this intervention was based on the information collected from the patient. The Data and conclusions were drawn from the patient's questionnaire, the interview, and the physical examination. Because the information  was provided in large part by the patient, it cannot be guaranteed that it has not been purposely or unconsciously manipulated. Every effort has been made to obtain as much relevant data as possible for this evaluation. It is important to note that the conclusions that lead to this procedure are derived in large part from the available data. Always take into account that the treatment will also be dependent on availability of resources and existing treatment guidelines, considered by other Pain Management Practitioners as being common knowledge and practice, at the time of the intervention. For Medico-Legal purposes, it is also important to point out that variation in procedural techniques and pharmacological choices are the acceptable norm. The indications, contraindications, technique, and results of the above procedure should only be interpreted and judged by a Board-Certified Interventional Pain Specialist with extensive familiarity and expertise in the same exact procedure and technique.

## 2020-08-08 NOTE — Patient Instructions (Signed)
____________________________________________________________________________________________  Post-Procedure Discharge Instructions  Instructions:  Apply ice:   Purpose: This will minimize any swelling and discomfort after procedure.   When: Day of procedure, as soon as you get home.  How: Fill a plastic sandwich bag with crushed ice. Cover it with a small towel and apply to injection site.  How long: (15 min on, 15 min off) Apply for 15 minutes then remove x 15 minutes.  Repeat sequence on day of procedure, until you go to bed.  Apply heat:   Purpose: To treat any soreness and discomfort from the procedure.  When: Starting the next day after the procedure.  How: Apply heat to procedure site starting the day following the procedure.  How long: May continue to repeat daily, until discomfort goes away.  Food intake: Start with clear liquids (like water) and advance to regular food, as tolerated.   Physical activities: Keep activities to a minimum for the first 8 hours after the procedure. After that, then as tolerated.  Driving: If you have received any sedation, be responsible and do not drive. You are not allowed to drive for 24 hours after having sedation.  Blood thinner: (Applies only to those taking blood thinners) You may restart your blood thinner 6 hours after your procedure.  Insulin: (Applies only to Diabetic patients taking insulin) As soon as you can eat, you may resume your normal dosing schedule.  Infection prevention: Keep procedure site clean and dry. Shower daily and clean area with soap and water.  Post-procedure Pain Diary: Extremely important that this be done correctly and accurately. Recorded information will be used to determine the next step in treatment. For the purpose of accuracy, follow these rules:  Evaluate only the area treated. Do not report or include pain from an untreated area. For the purpose of this evaluation, ignore all other areas of pain,  except for the treated area.  After your procedure, avoid taking a long nap and attempting to complete the pain diary after you wake up. Instead, set your alarm clock to go off every hour, on the hour, for the initial 8 hours after the procedure. Document the duration of the numbing medicine, and the relief you are getting from it.  Do not go to sleep and attempt to complete it later. It will not be accurate. If you received sedation, it is likely that you were given a medication that may cause amnesia. Because of this, completing the diary at a later time may cause the information to be inaccurate. This information is needed to plan your care.  Follow-up appointment: Keep your post-procedure follow-up evaluation appointment after the procedure (usually 2 weeks for most procedures, 6 weeks for radiofrequencies). DO NOT FORGET to bring you pain diary with you.   Expect: (What should I expect to see with my procedure?)  From numbing medicine (AKA: Local Anesthetics): Numbness or decrease in pain. You may also experience some weakness, which if present, could last for the duration of the local anesthetic.  Onset: Full effect within 15 minutes of injected.  Duration: It will depend on the type of local anesthetic used. On the average, 1 to 8 hours.   From steroids (Applies only if steroids were used): Decrease in swelling or inflammation. Once inflammation is improved, relief of the pain will follow.  Onset of benefits: Depends on the amount of swelling present. The more swelling, the longer it will take for the benefits to be seen. In some cases, up to 10 days.  Duration: Steroids will stay in the system x 2 weeks. Duration of benefits will depend on multiple posibilities including persistent irritating factors.  Side-effects: If present, they may typically last 2 weeks (the duration of the steroids).  Frequent: Cramps (if they occur, drink Gatorade and take over-the-counter Magnesium 450-500 mg  once to twice a day); water retention with temporary weight gain; increases in blood sugar; decreased immune system response; increased appetite.  Occasional: Facial flushing (red, warm cheeks); mood swings; menstrual changes.  Uncommon: Long-term decrease or suppression of natural hormones; bone thinning. (These are more common with higher doses or more frequent use. This is why we prefer that our patients avoid having any injection therapies in other practices.)   Very Rare: Severe mood changes; psychosis; aseptic necrosis.  From procedure: Some discomfort is to be expected once the numbing medicine wears off. This should be minimal if ice and heat are applied as instructed.  Call if: (When should I call?)  You experience numbness and weakness that gets worse with time, as opposed to wearing off.  New onset bowel or bladder incontinence. (Applies only to procedures done in the spine)  Emergency Numbers:  Durning business hours (Monday - Thursday, 8:00 AM - 4:00 PM) (Friday, 9:00 AM - 12:00 Noon): [phone]  After hours: [phone]  NOTE: If you are having a problem and are unable connect with, or to talk to a provider, then go to your nearest urgent care or emergency department. If the problem is serious and urgent, please call 911. ____________________________________________________________________________________________    ______________________________________________________________________________________________  Body mass index (BMI)  Body mass index (BMI) is a common tool for deciding whether a person has an appropriate body weight.  It measures a persons weight in relation to their height.   According to the [employer] of health (NIH): Marland Kitchen A BMI of less than 18.5 means that a person is underweight. . A BMI of between 18.5 and 24.9 is ideal. . A BMI of between 25 and 29.9 is overweight. . A BMI over 30 indicates obesity.  Weight Management  Required  URGENT: Your weight has been found to be adversely affecting your health.  Dear Ms. Neale Burly:  Your current Estimated body mass index is 41.6 kg/m as calculated from the following:   Height as of 07/06/20: 5\' 5"  (1.651 m).   Weight as of 07/06/20: 250 lb (113.4 kg).  Please use the table below to identify your weight category and associated incidence of chronic pain, secondary to your weight.  Body Mass Index (BMI) Classification BMI level (kg/m2) Category Associated incidence of chronic pain  <18  Underweight   18.5-24.9 Ideal body weight   25-29.9 Overweight  20%  30-34.9 Obese (Class I)  68%  35-39.9 Severe obesity (Class II)  136%  >40 Extreme obesity (Class III)  254%   In addition: You will be considered "Morbidly Obese", if your BMI is above 30 and you have one or more of the following conditions which are known to be caused and/or directly associated with obesity: 1.    Type 2 Diabetes (Which in turn can lead to cardiovascular diseases (CVD), stroke, peripheral vascular diseases (PVD), retinopathy, nephropathy, and neuropathy) 2.    Cardiovascular Disease (High Blood Pressure; Congestive Heart Failure; High Cholesterol; Coronary Artery Disease; Angina; or History of Heart Attacks) 3.    Breathing problems (Asthma; obesity-hypoventilation syndrome; obstructive sleep apnea; chronic inflammatory airway disease; reactive airway disease; or shortness of breath) 4.    Chronic kidney  disease 5.    Liver disease (nonalcoholic fatty liver disease) 6.    High blood pressure 7.    Acid reflux (gastroesophageal reflux disease; heartburn) 8.    Osteoarthritis (OA) (with any of the following: hip pain; knee pain; and/or low back pain) 9.    Low back pain (Lumbar Facet Syndrome; and/or Degenerative Disc Disease) 10.  Hip pain (Osteoarthritis of hip) (For every 1 lbs of added body weight, there is a 2 lbs increase in pressure inside of each hip articulation. 1:2 mechanical  relationship) 11.  Knee pain (Osteoarthritis of knee) (For every 1 lbs of added body weight, there is a 4 lbs increase in pressure inside of each knee articulation. 1:4 mechanical relationship) (patients with a BMI>30 kg/m2 were 6.8 times more likely to develop knee OA than normal-weight individuals) 12.  Cancer: Epidemiological studies have shown that obesity is a risk factor for: post-menopausal breast cancer; cancers of the endometrium, colon and kidney cancer; malignant adenomas of the oesophagus. Obese subjects have an approximately 1.5-3.5-fold increased risk of developing these cancers compared with normal-weight subjects, and it has been estimated that between 15 and 45% of these cancers can be attributed to overweight. More recent studies suggest that obesity may also increase the risk of other types of cancer, including pancreatic, hepatic and gallbladder cancer. (Ref: Obesity and cancer. Pischon T, Nthlings U, [employer] H. Proc Nutr Soc. 2008 May;67(2):128-45. doi: 11.5726/O0355974163845364.) The International Agency for Research on Cancer (IARC) has identified 13 cancers associated with overweight and obesity: meningioma, multiple myeloma, adenocarcinoma of the esophagus, and cancers of the thyroid, postmenopausal breast cancer, gallbladder, stomach, liver, pancreas, kidney, ovaries, uterus, colon and rectal (colorectal) cancers. 28 percent of all cancers diagnosed in women and 24 percent of those diagnosed in men are associated with overweight and obesity.  Recommendation: At this point it is urgent that you take a step back and concentrate in loosing weight. Dedicate 100% of your efforts on this task. Nothing else will improve your health more than bringing your weight down and your BMI to less than 30. If you are here, you probably have chronic pain. We know that most chronic pain patients have difficulty exercising secondary to their pain. For this reason, you must rely on proper nutrition and diet  in order to lose the weight. If your BMI is above 40, you should seriously consider bariatric surgery. A realistic goal is to lose 10% of your body weight over a period of 12 months.  Be honest to yourself, if over time you have unsuccessfully tried to lose weight, then it is time for you to seek professional help and to enter a medically supervised weight management program, and/or undergo bariatric surgery. Stop procrastinating.   Pain management considerations:  1.    Pharmacological Problems: Be advised that the use of opioid analgesics (oxycodone; hydrocodone; morphine; methadone; codeine; and all of their derivatives) have been associated with decreased metabolism and weight gain.  For this reason, should we see that you are unable to lose weight while taking these medications, it may become necessary for [place] to taper down and indefinitely discontinue them.  2.    Technical Problems: The incidence of successful interventional therapies decreases as the patient's BMI increases. It is much more difficult to accomplish a safe and effective interventional therapy on a patient with a BMI above 35. 3.    Radiation Exposure Problems: The x-rays machine, used to accomplish injection therapies, will automatically increase their x-ray output in order to  capture an appropriate bone image. This means that radiation exposure increases exponentially with the patient's BMI. (The higher the BMI, the higher the radiation exposure.) Although the level of radiation used at a given time is still safe to the patient, it is not for the physician and/or assisting staff. Unfortunately, radiation exposure is accumulative. Because physicians and the staff have to do procedures and be exposed on a daily basis, this can result in health problems such as cancer and radiation burns. Radiation exposure to the staff is monitored by the radiation batches that they wear. The exposure levels are reported back to the staff on a quarterly  basis. Depending on levels of exposure, physicians and staff may be obligated by law to decrease this exposure. This means that they have the right and obligation to refuse providing therapies where they may be overexposed to radiation. For this reason, physicians may decline to offer therapies such as radiofrequency ablation or implants to patients with a BMI above 40. 4.    Current Trends: Be advised that the current trend is to no longer offer certain therapies to patients with a BMI equal to, or above 35, due to increase perioperative risks, increased technical procedural difficulties, and excessive radiation exposure to healthcare personnel.  ______________________________________________________________________________________________

## 2020-08-08 NOTE — Progress Notes (Signed)
Safety precautions to be maintained throughout the outpatient stay will include: orient to surroundings, keep bed in low position, maintain call bell within reach at all times, provide assistance with transfer out of bed and ambulation.  

## 2020-08-09 ENCOUNTER — Encounter: Payer: Medicaid Other | Admitting: Pain Medicine

## 2020-08-09 ENCOUNTER — Telehealth: Payer: Self-pay

## 2020-08-09 NOTE — Telephone Encounter (Signed)
Post procedure phone call.  Patient states she is doing fine.  

## 2020-08-20 NOTE — Progress Notes (Deleted)
No show.

## 2020-08-21 ENCOUNTER — Ambulatory Visit: Payer: Medicaid Other | Admitting: Pain Medicine

## 2020-08-21 DIAGNOSIS — G8929 Other chronic pain: Secondary | ICD-10-CM

## 2020-08-21 DIAGNOSIS — M25519 Pain in unspecified shoulder: Secondary | ICD-10-CM

## 2020-08-21 DIAGNOSIS — G894 Chronic pain syndrome: Secondary | ICD-10-CM

## 2020-08-21 DIAGNOSIS — F1120 Opioid dependence, uncomplicated: Secondary | ICD-10-CM

## 2020-08-21 DIAGNOSIS — Z79899 Other long term (current) drug therapy: Secondary | ICD-10-CM

## 2020-09-10 NOTE — Progress Notes (Signed)
PROVIDER NOTE: Information contained herein reflects review and annotations entered in association with encounter. Interpretation of such information and data should be left to medically-trained personnel. Information provided to patient can be located elsewhere in the medical record under "Patient Instructions". Document created using STT-dictation technology, any transcriptional errors that may result from process are unintentional.    Patient: Danielle [name]  Service Category: E/M  Provider: Gaspar Cola, MD  DOB: [birth year]  DOS: 09/11/2020  Specialty: Interventional Pain Management  MRN: [id number]  Setting: Ambulatory outpatient  PCP: Danae Orleans, MD  Type: Established Patient    Referring Provider: Danae Orleans, MD  Location: Office  Delivery: Face-to-face     HPI  Danielle [name], a 60 y.o. year old female, is here today because of her Chronic pain syndrome [G89.4]. Danielle [name] primary complain today is Hip Pain Last encounter: My last encounter with her was on 08/21/2020. Pertinent problems: Danielle [name] has Cervical spinal cord compression [facility]) (April, 2017); Cervical spinal stenosis; Groin pain; Chronic shoulder pain (1ry area of Pain) (Bilateral) (R>L); Chronic low back pain (3ry area of Pain) (Bilateral) (R>L); Lumbar facet syndrome (Bilateral) (R>L); Lumbar spondylosis; Failed back surgical syndrome; Chronic lower extremity pain (Right); Chronic neck pain (Bilateral) (R>L); Chronic cervical radicular pain (Right); Ulnar neuropathy (Left); Hx of cervical spine surgery; Cervical spondylosis; Fibromyalgia; Chronic sacroiliac pain (Bilateral) (L>R); History of total shoulder replacement (Left); Chronic pain syndrome; Complaints of weakness of lower extremity; Anterolisthesis; Cervical facet syndrome ([place]); Chronic musculoskeletal pain; Muscle spasm of back; Myofascial pain; Chronic hip pain (2ry area of Pain) (Bilateral) (R>L); 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); Trigger  point with back pain; Osteoarthritis of shoulder (Bilateral); Spondylosis without myelopathy or radiculopathy, lumbosacral region; Cellulitis of leg, right; Chronic trochanteric bursitis (Bilateral); Osteoarthritis involving multiple joints; Chronic shoulder pain after replacement; History of total shoulder replacement (Right); History of total replacement of shoulder joints (Bilateral); Cervical spondylosis with myelopathy and radiculopathy; Chronic hip pain (Right); Osteoarthritis of hip (Right); Chronic sacroiliac joint pain (Left); Other spondylosis, sacral and sacrococcygeal region; Sacroiliac joint dysfunction (Left); Somatic dysfunction of sacroiliac joint (Left); and Enthesopathy of sacroiliac joint (Left) on their pertinent problem list. Pain Assessment: Severity of Chronic pain is reported as a 4 /10. Location: Hip Right,Left/pain radiaties down leg at times. Onset: More than a month ago. Quality: Throbbing,Aching. Timing: Constant. Modifying factor(s): nothing. Vitals:  height is '5\' 5"'  (1.651 m) and weight is 248 lb (112.5 kg). Her temperature is 97.1 F (36.2 C) (abnormal). Her blood pressure is 117/81 and her pulse is 95. Her oxygen saturation is 98%.   Reason for encounter: both, medication management and post-procedure assessment.  The patient indicates having attained an ongoing 80% relief of her shoulder pain through the bilateral suprascapular nerve block under fluoroscopic guidance and IV sedation.  Unfortunately, she is having a flareup of her bilateral hip pain, low back pain, and she is now complaining of some pain and numbness that has been going all the way down the right lower extremity to the bottom of her foot and what seems to be an S1 dermatomal distribution.  In the case of the left side, she is having pain and numbness going down to the anterior portion of her left thigh what appears to be an L1/L2 dermatomal distribution.  She is experiencing quite a bit of discomfort today and  therefore I have entered an order to provide her with an IM injection of Toradol 60 mg and Norflex 60 mg.  I  asked the patient and she indicated that she is not allergic to either medication.  Today I have reviewed the patient's imaging and clearly we need to move onto ordering an MRI both of her hips and her lumbar spine to further investigate what is going on at this point.  She does have an anterolisthesis of L4 over L5 and this is a likely place for her to be experiencing spinal stenosis affecting the L5 and S1 dermatomal distributions.  For this reason, I have entered an order to a right-sided L4-5 LESI under fluoroscopic guidance and IV sedation until we can get the results of the MRIs to further investigate whether surgery may be needed.  I asked the patient and she indicated not having any type of problems with doing the MRIs and not requiring sedation.  She also indicating preferring to have the MRI done at this hospital.  RTCB: 12/12/2020 Nonopioids transferred 05/15/2020: Zanaflex and Voltaren gel  Post-Procedure Evaluation  Procedure (08/21/2020): Diagnostic bilateral suprascapular nerve block #3 under fluoroscopic guidance and IV sedation Pre-procedure pain level: 4/10 Post-procedure: 0/10 (100% relief)  Sedation: Sedation provided.  Effectiveness during initial hour after procedure(Ultra-Short Term Relief): 100 %.  Local anesthetic used: Long-acting (4-6 hours) Effectiveness: Defined as any analgesic benefit obtained secondary to the administration of local anesthetics. This carries significant diagnostic value as to the etiological location, or anatomical origin, of the pain. Duration of benefit is expected to coincide with the duration of the local anesthetic used.  Effectiveness during initial 4-6 hours after procedure(Short-Term Relief): 100 %.  Long-term benefit: Defined as any relief past the pharmacologic duration of the local anesthetics.  Effectiveness past the initial 6 hours  after procedure(Long-Term Relief): 80 % (ongoing).  Current benefits: Defined as benefit that persist at this time.   Analgesia:  The patient indicates currently enjoying an 80% relief of her shoulder pain.  She is very happy about those results. Function: Ms. Kearn reports improvement in function ROM: [name] reports improvement in ROM  Pharmacotherapy Assessment   Analgesic: Oxycodone IR 10 mg, 1 tab PO q 6 hrs (40 mg/dayof oxycodone) MME/day:64m/day.   Monitoring: [place] PMP: PDMP reviewed during this encounter.       Pharmacotherapy: No side-effects or adverse reactions reported. Compliance: No problems identified. Effectiveness: Clinically acceptable.  BChauncey Fischer RN  09/11/2020  1:09 PM  Sign when Signing Visit Nursing Pain Medication Assessment:  Safety precautions to be maintained throughout the outpatient stay will include: orient to surroundings, keep bed in low position, maintain call bell within reach at all times, provide assistance with transfer out of bed and ambulation.  Medication Inspection Compliance: Pill count conducted under aseptic conditions, in front of the patient. Neither the pills nor the bottle was removed from the patient's sight at any time. Once count was completed pills were immediately returned to the patient in their original bottle.  Medication: Oxycodone IR Pill/Patch Count: 13 of 120 pills remain Pill/Patch Appearance: Markings consistent with prescribed medication Bottle Appearance: Standard pharmacy container. Clearly labeled. Filled Date: 2 / 3 / 22 Last Medication intake:  TodaySafety precautions to be maintained throughout the outpatient stay will include: orient to surroundings, keep bed in low position, maintain call bell within reach at all times, provide assistance with transfer out of bed and ambulation.     UDS:  Summary  Date Value Ref Range Status  01/13/2020 Note  Final    Comment:     ==================================================================== [employer] [facility] (MW) ==================================================================== Test  Result       Flag       Units  Drug Present and Declared for Prescription Verification   Oxycodone                      >4587        EXPECTED   ng/mg creat   Oxymorphone                    319          EXPECTED   ng/mg creat   Noroxycodone                   >4587        EXPECTED   ng/mg creat   Noroxymorphone                 79           EXPECTED   ng/mg creat    Sources of oxycodone are scheduled prescription medications.    Oxymorphone, noroxycodone, and noroxymorphone are expected    metabolites of oxycodone. Oxymorphone is also available as a    scheduled prescription medication.  ==================================================================== Test                      Result    Flag   Units      Ref Range   Creatinine              218              mg/dL      >=20 ==================================================================== Declared Medications:  The flagging and interpretation on this report are based on the  following declared medications.  Unexpected results may arise from  inaccuracies in the declared medications.   **Note: The testing scope of this panel includes these medications:   Oxycodone   **Note: The testing scope of this panel does not include the  following reported medications:   Albuterol (Ventolin HFA)  Citalopram (Celexa)  Diclofenac (Voltaren)  Docusate (Colace)  Fexofenadine (Allegra-D)  Fluticasone (Advair)  Guaifenesin (Mucinex)  Levothyroxine (Synthroid)  Magnesium (Milk of Magnesia)  Naloxone (Narcan)  Nystatin (Nystop)  Pseudoephedrine (Allegra-D)  Salmeterol (Advair)  Tiotropium (Spiriva)  Tizanidine (Zanaflex)  Varenicline (Chantix)  Vitamin D ==================================================================== For clinical  consultation, please call [phone]. ====================================================================      ROS  Constitutional: Denies any fever or chills Gastrointestinal: No reported hemesis, hematochezia, vomiting, or acute GI distress Musculoskeletal: Denies any acute onset joint swelling, redness, loss of ROM, or weakness Neurological: No reported episodes of acute onset apraxia, aphasia, dysarthria, agnosia, amnesia, paralysis, loss of coordination, or loss of consciousness  Medication Review  Fluticasone-Umeclidin-Vilant, Olopatadine HCl, Oxycodone HCl, albuterol, cetirizine, cholecalciferol, citalopram, diclofenac Sodium, docusate sodium, guaiFENesin, levothyroxine, magnesium hydroxide, naloxone, nystatin, and tiZANidine  History Review  Allergy: Ms. Beachem is allergic to aripiprazole, duloxetine, gabapentin, gold, iodinated diagnostic agents, naproxen sodium, nortriptyline hcl, nsaids, pregabalin, meperidine, ace inhibitors, aspirin, cefpodoxime, fluoxetine, fluticasone-salmeterol, levofloxacin, lithium, paroxetine, tape, telithromycin, theophylline, topiramate, trazodone, triamcinolone, venlafaxine, bupropion, ketorolac tromethamine, morphine, and moxifloxacin. Drug: Ms. Pavlovic  reports no history of drug use. Alcohol:  reports no history of alcohol use. Tobacco:  reports that she has been smoking cigarettes. She has a 80.00 pack-year smoking history. She has never used smokeless tobacco. Social: Ms. Yagi  reports that she has been smoking cigarettes. She has a 80.00 pack-year smoking history. She has never used smokeless tobacco. She reports  that she does not drink alcohol and does not use drugs. Medical:  has a past medical history of Acute postoperative pain (01/13/2017), Anxiety, Arthritis, Asthma, Bell's palsy, Bursitis, COPD (chronic obstructive pulmonary disease) ([place]), Depression, Fibromyalgia, Heart murmur, Hepatitis C, Hiatal hernia, Hyperlipidemia, Hypertension,  IBS (irritable bowel syndrome), Insomnia, and Thyroid disease. Surgical: Ms. Condrey  has a past surgical history that includes necksurgery (10/21/2014); Back surgery (2013); Foot surgery (Right); Elbow surgery (Left); Carpal tunnel release (Right); Nose surgery; Partial hysterectomy; Total shoulder replacement (Bilateral); and Colonoscopy with propofol (N/A, 02/05/2017). Family: family history includes Cancer in her mother; Gout in her mother; Heart disease in her father.  Laboratory Chemistry Profile   Renal Lab Results  Component Value Date   BUN 14 04/28/2019   CREATININE 0.98 04/28/2019   BCR 13 02/16/2019   GFRAA >60 04/28/2019   GFRNONAA >60 04/28/2019     Hepatic Lab Results  Component Value Date   AST 18 04/28/2019   ALT 16 04/28/2019   ALBUMIN 3.7 04/28/2019   ALKPHOS 80 04/28/2019     Electrolytes Lab Results  Component Value Date   NA 139 04/28/2019   K 4.1 04/28/2019   CL 105 04/28/2019   CALCIUM 9.3 04/28/2019   MG 2.1 02/16/2019     Bone Lab Results  Component Value Date   25OHVITD1 27 (L) 02/16/2019   25OHVITD2 2.0 02/16/2019   25OHVITD3 25 02/16/2019     Inflammation (CRP: Acute Phase) (ESR: Chronic Phase) Lab Results  Component Value Date   CRP 2 02/16/2019   ESRSEDRATE 23 02/16/2019       Note: Above Lab results reviewed.  Recent Imaging Review  DG PAIN CLINIC C-ARM 1-60 MIN NO REPORT Fluoro was used, but no Radiologist interpretation will be provided.  Please refer to "NOTES" tab for provider progress note. Note: Reviewed        Physical Exam  General appearance: Well nourished, well developed, and well hydrated. In no apparent acute distress Mental status: Alert, oriented x 3 (person, place, & time)       Respiratory: No evidence of acute respiratory distress Eyes: PERLA Vitals: BP 117/81   Pulse 95   Temp (!) 97.1 F (36.2 C)   Ht '5\' 5"'  (1.651 m)   Wt 248 lb (112.5 kg)   SpO2 98%   BMI 41.27 kg/m  BMI: Estimated body mass  index is 41.27 kg/m as calculated from the following:   Height as of this encounter: '5\' 5"'  (1.651 m).   Weight as of this encounter: 248 lb (112.5 kg). Ideal: Ideal body weight: 57 kg (125 lb 10.6 oz) Adjusted ideal body weight: 79.2 kg (174 lb 9.6 oz)  Assessment   Status Diagnosis  Controlled Controlled Controlled 1. Chronic pain syndrome   2. Chronic shoulder pain (1ry area of Pain) (Bilateral) (R>L)   3. Chronic shoulder pain after replacement   4. History of total shoulder replacement (Left)   5. Chronic hip pain (2ry area of Pain) (Bilateral) (R>L)   6. Chronic low back pain (3ry area of Pain) (Bilateral) (R>L)   7. Pharmacologic therapy   8. Uncomplicated opioid dependence ([place])   9. Other intervertebral disc degeneration, lumbar region   10. Chronic hip pain (Right)   11. Acute exacerbation of chronic low back pain      Updated Problems: No problems updated.  Plan of Care  Problem-specific:  No problem-specific Assessment & Plan notes found for this encounter.  Ms. CARLOYN LAHUE has a  current medication list which includes the following long-term medication(s): albuterol, citalopram, levothyroxine, olopatadine hcl, diclofenac sodium, [START ON 09/13/2020] oxycodone hcl, [START ON 10/13/2020] oxycodone hcl, [START ON 11/12/2020] oxycodone hcl, and tizanidine.  Pharmacotherapy (Medications Ordered): Meds ordered this encounter  Medications  . Oxycodone HCl 10 MG TABS    Sig: Take 1 tablet (10 mg total) by mouth every 6 (six) hours as needed. Must last 30 days    Dispense:  120 tablet    Refill:  0    Chronic Pain: STOP Act (Not applicable) Fill 1 day early if closed on refill date. Avoid benzodiazepines within 8 hours of opioids  . Oxycodone HCl 10 MG TABS    Sig: Take 1 tablet (10 mg total) by mouth every 6 (six) hours as needed. Must last 30 days    Dispense:  120 tablet    Refill:  0    Chronic Pain: STOP Act (Not applicable) Fill 1 day early if closed on refill  date. Avoid benzodiazepines within 8 hours of opioids  . Oxycodone HCl 10 MG TABS    Sig: Take 1 tablet (10 mg total) by mouth every 6 (six) hours as needed. Must last 30 days    Dispense:  120 tablet    Refill:  0    Chronic Pain: STOP Act (Not applicable) Fill 1 day early if closed on refill date. Avoid benzodiazepines within 8 hours of opioids  . ketorolac (TORADOL) injection 60 mg  . orphenadrine (NORFLEX) injection 60 mg   Orders:  Orders Placed This Encounter  Procedures  . Lumbar Epidural Injection    Standing Status:   Future    Standing Expiration Date:   10/09/2020    Scheduling Instructions:     Procedure: Interlaminar Lumbar Epidural Steroid injection (LESI)  L4-5     Laterality: Right-sided     Sedation: With Sedation.     Timeframe: ASAA    Order Specific Question:   Where will this procedure be performed?    Answer:   [facility]  . MR HIP RIGHT WO CONTRAST    Standing Status:   Future    Standing Expiration Date:   10/09/2020    Scheduling Instructions:     Imaging must be done as soon as possible. Inform patient that order will expire within 30 days and I will not renew it.    Order Specific Question:   What is the patient's sedation requirement?    Answer:   No Sedation    Order Specific Question:   Does the patient have a pacemaker or implanted devices?    Answer:   No    Order Specific Question:   Preferred imaging location?    Answer:   [facility] (table limit-350lbs)    Order Specific Question:   Call Results- Best Contact Number?    Answer:   [phone] ([facility])    Order Specific Question:   Radiology Contrast Protocol - do NOT remove file path    Answer:   \\[email or web address]  . MR HIP LEFT WO CONTRAST    Standing Status:   Future    Standing Expiration Date:   10/09/2020    Scheduling Instructions:     Imaging must be done as soon as possible. Inform patient that order will expire within 30 days  and I will not renew it.    Order Specific Question:   What is the patient's sedation requirement?    Answer:   No Sedation  Order Specific Question:   Does the patient have a pacemaker or implanted devices?    Answer:   No    Order Specific Question:   Preferred imaging location?    Answer:   [facility] (table limit-350lbs)    Order Specific Question:   Call Results- Best Contact Number?    Answer:   [phone] ([facility])    Order Specific Question:   Radiology Contrast Protocol - do NOT remove file path    Answer:   \\[email or web address]  . MR LUMBAR SPINE WO CONTRAST    Patient presents with axial pain with possible radicular component.  In addition to any acute findings, please report on:  1. Facet (Zygapophyseal) joint DJD (Hypertrophy, space narrowing, subchondral sclerosis, and/or osteophyte formation) 2. DDD and/or IVDD (Loss of disc height, desiccation or "Black disc disease") 3. Pars defects 4. Spondylolisthesis, spondylosis, and/or spondyloarthropathies (include Degree/Grade of displacement in mm) 5. Vertebral body Fractures, including age (old, new/acute) 64. Modic Type Changes 7. Demineralization 8. Bone pathology 9. Central, Lateral Recess, and/or Foraminal Stenosis (include AP diameter of stenosis in mm) 10. Surgical changes (hardware type, status, and presence of fibrosis)  NOTE: Please specify level(s) and laterality.    Standing Status:   Future    Standing Expiration Date:   10/09/2020    Scheduling Instructions:     Imaging must be done as soon as possible. Inform patient that order will expire within 30 days and I will not renew it.    Order Specific Question:   What is the patient's sedation requirement?    Answer:   No Sedation    Order Specific Question:   Does the patient have a pacemaker or implanted devices?    Answer:   No    Order Specific Question:   Preferred imaging location?    Answer:   [facility] (table limit-350lbs)    Order Specific Question:   Call Results- Best Contact Number?    Answer:   [phone] ([facility])    Order Specific Question:   Radiology Contrast Protocol - do NOT remove file path    Answer:   \\[email or web address]   Follow-up plan:   Return for Procedure (w/ sedation): (R) L4-5 LESI.      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  Allergic to NSAIDs.  Abnormal UDS (01/02/2016). (+) Unreported tramadol.   Planned  Pending:   Pending further evaluation   Under consideration:   Possible bilateral hip jointRFA Diagnostic caudal ESI + epidurogram  Possible Racz procedure.  Possible bilateral cervical facetRFA Diagnostic right CESI  Diagnostic left sacroiliac joint block Possible bilateral sacroiliac joint RFA   Completed:   Palliative bilateral trochanteric bursa x1  Palliative bilateralsuprascapular nerve block x3(08/08/2020) Palliative bilateral suprascapular nerveRFA x1(05/13/2019) Diagnostic right IA hip joint injection x3 (06/01/2020)  Diagnostic left IA hip joint injection x2 (04/30/2018)  Diagnostic bilateral lumbar facet block x3 Palliative right lumbar facet RFA x2(12/16/2017)  Palliative left lumbar facet RFA x2(02/12/2018)  Diagnostic bilateral cervical facet block x1 Diagnostic right SI joint block x1   Therapeutic  Palliative (PRN) options:   Palliative bilateral trochanteric bursa #2  Palliative bilateralsuprascapular nerve block #4 Palliative bilateral suprascapular nerveRFA #2 Diagnostic right IA hip joint injection #4 Diagnostic left IA hip joint injection #3 Diagnostic bilateral lumbar facet block #4 Palliative right lumbar facet RFA #3 Palliative left lumbar facet RFA #3 Diagnostic bilateral cervical facet block #2 Diagnostic  right SI joint block #2    Recent Visits Date Type  Provider Dept  08/08/20 Procedure visit Milinda Pointer, MD [facility]  07/20/20 Telemedicine Milinda Pointer, MD [facility]  07/06/20 Procedure visit Milinda Pointer, MD [facility]  06/21/20 Office Visit Milinda Pointer, MD [facility]  Showing recent visits within past 90 days and meeting all other requirements Today's Visits Date Type Provider Dept  09/11/20 Office Visit Milinda Pointer, MD [facility]  Showing today's visits and meeting all other requirements Future Appointments No visits were found meeting these conditions. Showing future appointments within next 90 days and meeting all other requirements  I discussed the assessment and treatment plan with the patient. The patient was provided an opportunity to ask questions and all were answered. The patient agreed with the plan and demonstrated an understanding of the instructions.  Patient advised to call back or seek an in-person evaluation if the symptoms or condition worsens.  Duration of encounter: 35 minutes.  Note by: Gaspar Cola, MD Date: 09/11/2020; Time: 1:55 PM

## 2020-09-11 ENCOUNTER — Other Ambulatory Visit: Payer: Self-pay

## 2020-09-11 ENCOUNTER — Encounter: Payer: Self-pay | Admitting: Pain Medicine

## 2020-09-11 ENCOUNTER — Ambulatory Visit: Payer: Medicaid Other | Attending: Pain Medicine | Admitting: Pain Medicine

## 2020-09-11 VITALS — BP 117/81 | HR 95 | Temp 97.1°F | Ht 65.0 in | Wt 248.0 lb

## 2020-09-11 DIAGNOSIS — M25551 Pain in right hip: Secondary | ICD-10-CM | POA: Insufficient documentation

## 2020-09-11 DIAGNOSIS — R531 Weakness: Secondary | ICD-10-CM | POA: Insufficient documentation

## 2020-09-11 DIAGNOSIS — M797 Fibromyalgia: Secondary | ICD-10-CM | POA: Insufficient documentation

## 2020-09-11 DIAGNOSIS — M5136 Other intervertebral disc degeneration, lumbar region: Secondary | ICD-10-CM | POA: Diagnosis not present

## 2020-09-11 DIAGNOSIS — M5441 Lumbago with sciatica, right side: Secondary | ICD-10-CM | POA: Diagnosis not present

## 2020-09-11 DIAGNOSIS — F1120 Opioid dependence, uncomplicated: Secondary | ICD-10-CM | POA: Diagnosis not present

## 2020-09-11 DIAGNOSIS — M25511 Pain in right shoulder: Secondary | ICD-10-CM | POA: Diagnosis not present

## 2020-09-11 DIAGNOSIS — M25552 Pain in left hip: Secondary | ICD-10-CM | POA: Diagnosis not present

## 2020-09-11 DIAGNOSIS — G894 Chronic pain syndrome: Secondary | ICD-10-CM | POA: Insufficient documentation

## 2020-09-11 DIAGNOSIS — M25519 Pain in unspecified shoulder: Secondary | ICD-10-CM

## 2020-09-11 DIAGNOSIS — M19011 Primary osteoarthritis, right shoulder: Secondary | ICD-10-CM | POA: Diagnosis not present

## 2020-09-11 DIAGNOSIS — Z96612 Presence of left artificial shoulder joint: Secondary | ICD-10-CM | POA: Insufficient documentation

## 2020-09-11 DIAGNOSIS — F17210 Nicotine dependence, cigarettes, uncomplicated: Secondary | ICD-10-CM | POA: Insufficient documentation

## 2020-09-11 DIAGNOSIS — M25512 Pain in left shoulder: Secondary | ICD-10-CM

## 2020-09-11 DIAGNOSIS — M5030 Other cervical disc degeneration, unspecified cervical region: Secondary | ICD-10-CM | POA: Insufficient documentation

## 2020-09-11 DIAGNOSIS — M4712 Other spondylosis with myelopathy, cervical region: Secondary | ICD-10-CM | POA: Diagnosis not present

## 2020-09-11 DIAGNOSIS — M5137 Other intervertebral disc degeneration, lumbosacral region: Secondary | ICD-10-CM | POA: Insufficient documentation

## 2020-09-11 DIAGNOSIS — G5622 Lesion of ulnar nerve, left upper limb: Secondary | ICD-10-CM | POA: Diagnosis not present

## 2020-09-11 DIAGNOSIS — R1030 Lower abdominal pain, unspecified: Secondary | ICD-10-CM | POA: Insufficient documentation

## 2020-09-11 DIAGNOSIS — G8929 Other chronic pain: Secondary | ICD-10-CM

## 2020-09-11 DIAGNOSIS — M5106 Intervertebral disc disorders with myelopathy, lumbar region: Secondary | ICD-10-CM | POA: Diagnosis not present

## 2020-09-11 DIAGNOSIS — M4802 Spinal stenosis, cervical region: Secondary | ICD-10-CM | POA: Diagnosis not present

## 2020-09-11 DIAGNOSIS — Z79899 Other long term (current) drug therapy: Secondary | ICD-10-CM

## 2020-09-11 DIAGNOSIS — Z96611 Presence of right artificial shoulder joint: Secondary | ICD-10-CM | POA: Insufficient documentation

## 2020-09-11 DIAGNOSIS — M4316 Spondylolisthesis, lumbar region: Secondary | ICD-10-CM | POA: Diagnosis not present

## 2020-09-11 DIAGNOSIS — M545 Low back pain, unspecified: Secondary | ICD-10-CM

## 2020-09-11 DIAGNOSIS — Z96619 Presence of unspecified artificial shoulder joint: Secondary | ICD-10-CM

## 2020-09-11 MED ORDER — KETOROLAC TROMETHAMINE 60 MG/2ML IM SOLN
INTRAMUSCULAR | Status: AC
Start: 2020-09-11 — End: ?
  Filled 2020-09-11: qty 2

## 2020-09-11 MED ORDER — ORPHENADRINE CITRATE 30 MG/ML IJ SOLN
INTRAMUSCULAR | Status: AC
  Filled 2020-09-11: qty 2

## 2020-09-11 MED ORDER — ORPHENADRINE CITRATE 30 MG/ML IJ SOLN
60.0000 mg | Freq: Once | INTRAMUSCULAR | Status: AC
  Administered 2020-09-11: 60 mg via INTRAMUSCULAR

## 2020-09-11 MED ORDER — KETOROLAC TROMETHAMINE 60 MG/2ML IM SOLN
60.0000 mg | Freq: Once | INTRAMUSCULAR | Status: AC
  Administered 2020-09-11: 60 mg via INTRAMUSCULAR

## 2020-09-11 MED ORDER — OXYCODONE HCL 10 MG PO TABS
10.0000 mg | ORAL_TABLET | Freq: Four times a day (QID) | ORAL | 0 refills | Status: DC | PRN
Start: 2020-09-13 — End: 2020-11-28

## 2020-09-11 MED ORDER — OXYCODONE HCL 10 MG PO TABS: 10.0000 mg | ORAL_TABLET | Freq: Four times a day (QID) | ORAL | 0 refills | Status: DC | PRN

## 2020-09-11 NOTE — Patient Instructions (Signed)
____________________________________________________________________________________________  Preparing for Procedure with Sedation  Procedure appointments are limited to planned procedures: . No Prescription Refills. . No disability issues will be discussed. . No medication changes will be discussed.  Instructions: . Oral Intake: Do not eat or drink anything for at least 8 hours prior to your procedure. (Exception: Blood Pressure Medication. See below.) . Transportation: Unless otherwise stated by your physician, you may drive yourself after the procedure. . Blood Pressure Medicine: Do not forget to take your blood pressure medicine with a sip of water the morning of the procedure. If your Diastolic (lower reading)is above 100 mmHg, elective cases will be cancelled/rescheduled. . Blood thinners: These will need to be stopped for procedures. Notify our staff if you are taking any blood thinners. Depending on which one you take, there will be specific instructions on how and when to stop it. . Diabetics on insulin: Notify the staff so that you can be scheduled 1st case in the morning. If your diabetes requires high dose insulin, take only  of your normal insulin dose the morning of the procedure and notify the staff that you have done so. . Preventing infections: Shower with an antibacterial soap the morning of your procedure. . Build-up your immune system: Take 1000 mg of Vitamin C with every meal (3 times a day) the day prior to your procedure. . Antibiotics: Inform the staff if you have a condition or reason that requires you to take antibiotics before dental procedures. . Pregnancy: If you are pregnant, call and cancel the procedure. . Sickness: If you have a cold, fever, or any active infections, call and cancel the procedure. . Arrival: You must be in the facility at least 30 minutes prior to your scheduled procedure. . Children: Do not bring children with you. . Dress appropriately:  Bring dark clothing that you would not mind if they get stained. . Valuables: Do not bring any jewelry or valuables.  Reasons to call and reschedule or cancel your procedure: (Following these recommendations will minimize the risk of a serious complication.) . Surgeries: Avoid having procedures within 2 weeks of any surgery. (Avoid for 2 weeks before or after any surgery). . Flu Shots: Avoid having procedures within 2 weeks of a flu shots or . (Avoid for 2 weeks before or after immunizations). . Barium: Avoid having a procedure within 7-10 days after having had a radiological study involving the use of radiological contrast. (Myelograms, Barium swallow or enema study). . Heart attacks: Avoid any elective procedures or surgeries for the initial 6 months after a "Myocardial Infarction" (Heart Attack). . Blood thinners: It is imperative that you stop these medications before procedures. Let us know if you if you take any blood thinner.  . Infection: Avoid procedures during or within two weeks of an infection (including chest colds or gastrointestinal problems). Symptoms associated with infections include: Localized redness, fever, chills, night sweats or profuse sweating, burning sensation when voiding, cough, congestion, stuffiness, runny nose, sore throat, diarrhea, nausea, vomiting, cold or Flu symptoms, recent or current infections. It is specially important if the infection is over the area that we intend to treat. . Heart and lung problems: Symptoms that may suggest an active cardiopulmonary problem include: cough, chest pain, breathing difficulties or shortness of breath, dizziness, ankle swelling, uncontrolled high or unusually low blood pressure, and/or palpitations. If you are experiencing any of these symptoms, cancel your procedure and contact your primary care physician for an evaluation.  Remember:  Regular Business hours are:    Monday to Thursday 8:00 AM to 4:00 PM  Provider's  Schedule: Lindberg Zenon, MD:  Procedure days: Tuesday and Thursday 7:30 AM to 4:00 PM  Bilal Lateef, MD:  Procedure days: Monday and Wednesday 7:30 AM to 4:00 PM ____________________________________________________________________________________________   ____________________________________________________________________________________________  General Risks and Possible Complications  Patient Responsibilities: It is important that you read this as it is part of your informed consent. It is our duty to inform you of the risks and possible complications associated with treatments offered to you. It is your responsibility as a patient to read this and to ask questions about anything that is not clear or that you believe was not covered in this document.  Patient's Rights: You have the right to refuse treatment. You also have the right to change your mind, even after initially having agreed to have the treatment done. However, under this last option, if you wait until the last second to change your mind, you may be charged for the materials used up to that point.  Introduction: Medicine is not an exact science. Everything in Medicine, including the lack of treatment(s), carries the potential for danger, harm, or loss (which is by definition: Risk). In Medicine, a complication is a secondary problem, condition, or disease that can aggravate an already existing one. All treatments carry the risk of possible complications. The fact that a side effects or complications occurs, does not imply that the treatment was conducted incorrectly. It must be clearly understood that these can happen even when everything is done following the highest safety standards.  No treatment: You can choose not to proceed with the proposed treatment alternative. The "PRO(s)" would include: avoiding the risk of complications associated with the therapy. The "CON(s)" would include: not getting any of the treatment  benefits. These benefits fall under one of three categories: diagnostic; therapeutic; and/or palliative. Diagnostic benefits include: getting information which can ultimately lead to improvement of the disease or symptom(s). Therapeutic benefits are those associated with the successful treatment of the disease. Finally, palliative benefits are those related to the decrease of the primary symptoms, without necessarily curing the condition (example: decreasing the pain from a flare-up of a chronic condition, such as incurable terminal cancer).  General Risks and Complications: These are associated to most interventional treatments. They can occur alone, or in combination. They fall under one of the following six (6) categories: no benefit or worsening of symptoms; bleeding; infection; nerve damage; allergic reactions; and/or death. 1. No benefits or worsening of symptoms: In Medicine there are no guarantees, only probabilities. No healthcare provider can ever guarantee that a medical treatment will work, they can only state the probability that it may. Furthermore, there is always the possibility that the condition may worsen, either directly, or indirectly, as a consequence of the treatment. 2. Bleeding: This is more common if the patient is taking a blood thinner, either prescription or over the counter (example: Goody Powders, Fish oil, Aspirin, Garlic, etc.), or if suffering a condition associated with impaired coagulation (example: Hemophilia, cirrhosis of the liver, low platelet counts, etc.). However, even if you do not have one on these, it can still happen. If you have any of these conditions, or take one of these drugs, make sure to notify your treating physician. 3. Infection: This is more common in patients with a compromised immune system, either due to disease (example: diabetes, cancer, human immunodeficiency virus [HIV], etc.), or due to medications or treatments (example: therapies used to treat  cancer and   rheumatological diseases). However, even if you do not have one on these, it can still happen. If you have any of these conditions, or take one of these drugs, make sure to notify your treating physician. 4. Nerve Damage: This is more common when the treatment is an invasive one, but it can also happen with the use of medications, such as those used in the treatment of cancer. The damage can occur to small secondary nerves, or to large primary ones, such as those in the spinal cord and brain. This damage may be temporary or permanent and it may lead to impairments that can range from temporary numbness to permanent paralysis and/or brain death. 5. Allergic Reactions: Any time a substance or material comes in contact with our body, there is the possibility of an allergic reaction. These can range from a mild skin rash (contact dermatitis) to a severe systemic reaction (anaphylactic reaction), which can result in death. 6. Death: In general, any medical intervention can result in death, most of the time due to an unforeseen complication. ____________________________________________________________________________________________   

## 2020-09-11 NOTE — Progress Notes (Signed)
Nursing Pain Medication Assessment:  Safety precautions to be maintained throughout the outpatient stay will include: orient to surroundings, keep bed in low position, maintain call bell within reach at all times, provide assistance with transfer out of bed and ambulation.  Medication Inspection Compliance: Pill count conducted under aseptic conditions, in front of the patient. Neither the pills nor the bottle was removed from the patient's sight at any time. Once count was completed pills were immediately returned to the patient in their original bottle.  Medication: Oxycodone IR Pill/Patch Count: 13 of 120 pills remain Pill/Patch Appearance: Markings consistent with prescribed medication Bottle Appearance: Standard pharmacy container. Clearly labeled. Filled Date: 2 / 3 / 22 Last Medication intake:  TodaySafety precautions to be maintained throughout the outpatient stay will include: orient to surroundings, keep bed in low position, maintain call bell within reach at all times, provide assistance with transfer out of bed and ambulation.

## 2020-09-18 ENCOUNTER — Telehealth: Payer: Self-pay | Admitting: Pain Medicine

## 2020-09-18 NOTE — Telephone Encounter (Signed)
Ok I finally found orders for MRI under referrals. Blanch Media wrote denial note on the procedure order. Im not even sure she knows about the MRIs.

## 2020-09-18 NOTE — Telephone Encounter (Signed)
Patient has been denied because she has not had PT or home exercise, states she is in so much pain she cant stand or walk. Wants to speak with a nurse

## 2020-09-19 NOTE — Telephone Encounter (Signed)
Blanch Media you may already have this... MRI orders not in correct place. They were under referrals.

## 2020-09-19 NOTE — Telephone Encounter (Signed)
She has an MRI scheduled for 09/27/20 at 3pm patient was notified about this

## 2020-09-19 NOTE — Telephone Encounter (Signed)
Informed patient that the [facility] has been denied by her insurance. Unable to offer Toradol/Norflex injection due to allergy. I discussed this with Olegario Shearer, she suggested patient call her insurance and explain the situation, perhaps they may then approve it.

## 2020-09-19 NOTE — Telephone Encounter (Signed)
I need to ask TY if she did this first, so if she did we wont be doing duplicate work.

## 2020-09-27 ENCOUNTER — Other Ambulatory Visit: Payer: Self-pay

## 2020-09-27 ENCOUNTER — Ambulatory Visit
Admission: RE | Admit: 2020-09-27 | Discharge: 2020-09-27 | Disposition: A | Discharge status: Home / Self Care | Payer: Medicaid Other | Source: Ambulatory Visit | Attending: Pain Medicine | Admitting: Pain Medicine

## 2020-09-27 DIAGNOSIS — M5137 Other intervertebral disc degeneration, lumbosacral region: Secondary | ICD-10-CM | POA: Insufficient documentation

## 2020-09-27 DIAGNOSIS — G8929 Other chronic pain: Secondary | ICD-10-CM | POA: Diagnosis present

## 2020-09-27 DIAGNOSIS — M4316 Spondylolisthesis, lumbar region: Secondary | ICD-10-CM | POA: Diagnosis present

## 2020-09-27 DIAGNOSIS — M545 Low back pain, unspecified: Secondary | ICD-10-CM | POA: Diagnosis present

## 2020-09-27 DIAGNOSIS — M5136 Other intervertebral disc degeneration, lumbar region: Secondary | ICD-10-CM | POA: Diagnosis present

## 2020-09-27 DIAGNOSIS — M5441 Lumbago with sciatica, right side: Secondary | ICD-10-CM | POA: Insufficient documentation

## 2020-10-03 ENCOUNTER — Ambulatory Visit: Payer: Medicaid Other

## 2020-10-03 ENCOUNTER — Other Ambulatory Visit: Payer: Self-pay

## 2020-10-03 ENCOUNTER — Ambulatory Visit
Admission: RE | Admit: 2020-10-03 | Discharge: 2020-10-03 | Disposition: A | Discharge status: Home / Self Care | Payer: Medicaid Other | Source: Ambulatory Visit | Attending: Pain Medicine | Admitting: Pain Medicine

## 2020-10-03 DIAGNOSIS — M25552 Pain in left hip: Secondary | ICD-10-CM | POA: Diagnosis present

## 2020-10-03 DIAGNOSIS — M25551 Pain in right hip: Secondary | ICD-10-CM | POA: Insufficient documentation

## 2020-10-03 DIAGNOSIS — G8929 Other chronic pain: Secondary | ICD-10-CM

## 2020-10-09 ENCOUNTER — Ambulatory Visit: Payer: Medicaid Other | Attending: Pain Medicine | Admitting: Pain Medicine

## 2020-10-09 ENCOUNTER — Other Ambulatory Visit: Payer: Self-pay

## 2020-10-09 ENCOUNTER — Encounter: Payer: Self-pay | Admitting: Pain Medicine

## 2020-10-09 VITALS — BP 113/73 | HR 91 | Temp 97.0°F | Ht 65.0 in | Wt 248.0 lb

## 2020-10-09 DIAGNOSIS — S73191S Other sprain of right hip, sequela: Secondary | ICD-10-CM | POA: Diagnosis present

## 2020-10-09 DIAGNOSIS — G8929 Other chronic pain: Secondary | ICD-10-CM

## 2020-10-09 DIAGNOSIS — M961 Postlaminectomy syndrome, not elsewhere classified: Secondary | ICD-10-CM

## 2020-10-09 DIAGNOSIS — M160 Bilateral primary osteoarthritis of hip: Secondary | ICD-10-CM

## 2020-10-09 DIAGNOSIS — R1032 Left lower quadrant pain: Secondary | ICD-10-CM | POA: Diagnosis present

## 2020-10-09 DIAGNOSIS — R935 Abnormal findings on diagnostic imaging of other abdominal regions, including retroperitoneum: Secondary | ICD-10-CM | POA: Diagnosis present

## 2020-10-09 DIAGNOSIS — M5441 Lumbago with sciatica, right side: Secondary | ICD-10-CM | POA: Diagnosis present

## 2020-10-09 DIAGNOSIS — M7062 Trochanteric bursitis, left hip: Secondary | ICD-10-CM

## 2020-10-09 DIAGNOSIS — S73192S Other sprain of left hip, sequela: Secondary | ICD-10-CM

## 2020-10-09 DIAGNOSIS — G96198 Other disorders of meninges, not elsewhere classified: Secondary | ICD-10-CM | POA: Diagnosis present

## 2020-10-09 DIAGNOSIS — R1031 Right lower quadrant pain: Secondary | ICD-10-CM

## 2020-10-09 DIAGNOSIS — M7061 Trochanteric bursitis, right hip: Secondary | ICD-10-CM | POA: Diagnosis present

## 2020-10-09 DIAGNOSIS — R937 Abnormal findings on diagnostic imaging of other parts of musculoskeletal system: Secondary | ICD-10-CM | POA: Insufficient documentation

## 2020-10-09 DIAGNOSIS — G894 Chronic pain syndrome: Secondary | ICD-10-CM | POA: Diagnosis not present

## 2020-10-09 DIAGNOSIS — M79604 Pain in right leg: Secondary | ICD-10-CM

## 2020-10-09 NOTE — Progress Notes (Signed)
Safety precautions to be maintained throughout the outpatient stay will include: orient to surroundings, keep bed in low position, maintain call bell within reach at all times, provide assistance with transfer out of bed and ambulation.  

## 2020-10-09 NOTE — Patient Instructions (Addendum)
Spinal Cord Stimulation Trial Information A spinal cord stimulation trial is a test to see whether a spinal cord stimulator reduces your pain. A spinal cord stimulator is a small device that is inserted (implanted) in your back. The stimulator has small wires (leads) that connect it to your spinal cord. The stimulator sends electrical pulses through the leads to the spinal cord. This can relieve pain. Settings for the stimulator can be adjusted with a remote device to find the best pain control. Your health care provider may suggest a spinal cord stimulation trial if other treatments for long-lasting (chronic) pain have not worked for you. Spinal cord stimulation may be used to manage pain that is caused by:  Coronary artery disease or peripheral vascular disease.  Failed back surgery.  Phantom limb sensation.  Peripheral neuropathy.  Complex regional pain syndrome.  Other syndromes that involve chronic pain. For the trial, the stimulator is attached to your back instead of inserted under the skin. A trial period is usually 3-5 days, but this can vary among health care providers. After your trial period, you and your health care provider will discuss whether a permanent spinal cord stimulator is an option for you. The permanent stimulator may be an option depending on:  Whether the stimulator reduces your pain during the trial.  Whether the stimulator fits into your lifestyle.  Whether the cost of the stimulator is covered by your insurance. What are the risks? Generally, a spinal cord stimulation trial is safe. However, problems can occur, including:  Bleeding or pain at the insertion site of the leads.  Infection at the insertion site or around the leads.  Allergic reactions to medicines, devices, or dyes.  Damage to the skin, nerves, back muscles, or spinal cord where the leads are placed.  Inability to move the legs (paralysis).  Numbness in the legs.  Inability to control  when you urinate or have a bowel movement (incontinence).  Spinal fluid leakage. How is a spinal cord stimulator placed for a trial? For a trial period, the stimulator is placed on your skin, not under it. Only the leads that connect the stimulator to the spinal cord are implanted under your skin. The exact location of the stimulator depends on where you have pain. There are two types of surgery for implanting the leads:  Noninvasive surgery. In this type of surgery, a small incision is made and needles are used to place the leads under your skin.  Open surgery. In this type of surgery, a larger incision is made, and the leads are implanted directly into your back.   How should I care for myself during the trial period? Activity  Return to your normal activities as told by your health care provider. Ask your health care provider what activities are safe for you.  Do not lift anything that is heavier than 10 lb (4.5 kg), or the limit that you are told. General instructions  Follow your health care provider's specific instructions about how to take care of your spinal cord stimulator and your incision.  Make sure you write down the following information so that you can share this information with your health care provider: ? Your responses to the stimulator. Describe these as told by your health care provider. ? Your pain level throughout the day. ? The amount and kind of pain medicine that you take.  Take over-the-counter and prescription medicines only as told by your health care provider.  Do not take baths, swim, or use  a hot tub until your health care provider approves.  Tell all health care providers who provide care for you that you have a spinal cord stimulator. This is important information that could affect the medical treatment that you receive.  Keep all follow-up visits as told by your health care provider. This is important. When should I seek medical care? During the  trial, seek medical care if:  You have redness, swelling, or pain around your incision.  You have fluid or blood coming from your incision.  Your incision feels warm to the touch.  You have pus or a bad smell coming from your incision.  The bandage (dressing) that covers your incision comes off. Get help right away if:  Your pain gets worse.  The stimulator leads come out.  You develop numbness or weakness in your legs, or you have difficulty walking.  You have problems urinating or having a bowel movement.  You have a fever.  You have symptoms that last for more than 2-3 days.  Your symptoms suddenly get worse. Summary  A spinal cord stimulator is a small device that sends electrical pulses to your spinal cord. This can relieve pain caused by many different health conditions.  Before a permanent stimulator is placed, you will have a trial using a temporary stimulator that is not implanted under your skin. This helps determine if a stimulator will reduce your pain.  For the trial, only the leads that connect the stimulator to the spinal cord are implanted under your skin.  During the trial period, make sure you write down information about your pain and your responses to the stimulator so that you can share this information with your health care provider.  Keep all follow-up visits as told by your health care provider. This is important. Contact your health care provider if you have symptoms that indicate a problem. This information is not intended to replace advice given to you by your health care provider. Make sure you discuss any questions you have with your health care provider. Document Revised: 03/26/2019 Document Reviewed: 08/05/2018 Elsevier Patient Education  2021 [employer].   Intrathecal Pain Pump Information An intrathecal pain pump is a pump that sends medicine into your spinal canal through a thin tube (catheter). You may need an intrathecal pain pump if  you have long-term pain that cannot be managed in other ways. In some cases, this pump can be used to deliver medicine that treats long-term muscle spasms (spasticity). If you are using the pump for only a short time, the pump may be outside your body. For long-term use, the pump and the catheter are implanted inside your body. The pump is a small round disc and is usually placed under the skin of the abdomen. The catheter will run under your skin from your abdomen to your back. Your health care provider will program the pump to deliver pain medicine into your spinal canal continuously or at certain intervals. You may have a handheld device that allows you to give yourself a dose of pain medicine when needed. The pump runs on a battery. The battery will last 7-10 years, and then the pump will need to be replaced. What are the benefits of an intrathecal pain pump? Opioid pain medicines are strong medicines that are often used to control long-term and severe pain. When these medicines are placed in the fluid space (intrathecal space) that surrounds your spinal cord and brain, the medicine will block the pain signals that come  from your body. This type of pump may be an option if you have long-term pain that cannot be managed with opioids that are taken by mouth or by injection. The pump may allow you to have better control of your pain with less medicine. What are the risks of an intrathecal pain pump? Before you can have the pump placed, you will need to go through a pain control trial. Medicine will be placed into your intrathecal space to see if it controls your pain and to see how well you tolerate the medicine. You will need to have a surgical procedure to place the catheter and the pain pump. This surgery has some risks. There are also risks to long-term use of opioids. Risks of pain pump implantation and use include: Infection. Bleeding. Failure of the pump or the catheter. Leaking of fluid from  your spinal canal (cerebrospinal fluid leak). A growth around the tip of the catheter inside your spinal canal (granuloma). This can put pressure on your spine. A need to remove the pump if problems develop. A need to replace the pump after 7-10 years. Risks of long-term opioid use include: A breathing problem that prevents you from getting enough oxygen (respiratory depression). Physical and psychological dependence. Constipation or difficulty passing urine. Nausea and vomiting. Mental changes, including feeling high, dizzy, drowsy, confused, depressed, or anxious. How do I use the pump? Your health care provider will decide what type of pump is best for you. If you have a pump that gives a dose of medicine on demand, you may have a handheld device to trigger the release of your medicine. You will need to see your health care provider often to make sure the pump is programmed correctly for your pain. This programming may need to be changed over time. You will also need to have the pump resupplied with medicine. Your health care provider may do this at your routine visits. You should also know that: Electronic devices or scans will not damage your pump. You will need a device identification card to pass through security gates. Your device will have an alarm to warn you of a malfunction or low battery power. The alarm will sound if you need a medicine refill. Contact your health care provider if your alarm sounds. Summary An intrathecal pain pump is a pump that sends medicine into your spinal canal through a thin tube (catheter). The pump is a small round disc and is usually placed under the skin of the abdomen. This type of pump may be an option if you have long-term pain that cannot be managed with opioids that are taken by mouth or by injection. The pump may allow you to have better control of your pain with less medicine. This information is not intended to replace advice given to you by  your health care provider. Make sure you discuss any questions you have with your health care provider. Document Revised: 05/04/2018 Document Reviewed: 05/04/2018 Elsevier Patient Education  2021 [employer].

## 2020-10-09 NOTE — Progress Notes (Signed)
PROVIDER NOTE: Information contained herein reflects review and annotations entered in association with encounter. Interpretation of such information and data should be left to medically-trained personnel. Information provided to patient can be located elsewhere in the medical record under "Patient Instructions". Document created using STT-dictation technology, any transcriptional errors that may result from process are unintentional.    Patient: Danielle [name]  Service Category: E/M  Provider: Gaspar Cola, MD  DOB: 11-26-1960  DOS: 10/09/2020  Specialty: Interventional Pain Management  MRN: [id number]  Setting: Ambulatory outpatient  PCP: Danae Orleans, MD  Type: Established Patient    Referring Provider: Danae Orleans, MD  Location: Office  Delivery: Face-to-face     HPI  [name], a 60 y.o. year old female, is here today because of her Chronic pain syndrome [G89.4]. Ms. Barretta primary complain today is Hip Pain (both) Last encounter: My last encounter with her was on 09/18/2020. Pertinent problems: Ms. Sandy has Cervical spinal cord compression [facility] [place]) (April, 2017); Cervical spinal stenosis; Chronic shoulder pain (1ry area of Pain) (Bilateral) (R>L); Chronic low back pain (3ry area of Pain) (Bilateral) (R>L) w/ sciatica (Right); Lumbar facet syndrome (Bilateral) (R>L); Lumbar spondylosis; Failed back surgical syndrome; Chronic lower extremity pain (Right); Chronic neck pain (Bilateral) (R>L); Chronic cervical radicular pain (Right); Ulnar neuropathy (Left); Hx of cervical spine surgery; Cervical spondylosis; Fibromyalgia; Chronic sacroiliac pain (Bilateral) (L>R); History of total shoulder replacement (Left); Chronic pain syndrome; Complaints of weakness of lower extremity; Anterolisthesis of lumbar spine (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); Cervical facet syndrome ([place]); Chronic musculoskeletal pain; Muscle spasm of back; Myofascial pain; Chronic hip pain (2ry area of Pain) (Bilateral) (R>L);  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); Trigger point with back pain; Osteoarthritis of shoulder (Bilateral); Spondylosis without myelopathy or radiculopathy, lumbosacral region; Cellulitis of leg, right; Chronic trochanteric bursitis (Bilateral); Osteoarthritis involving multiple joints; Chronic shoulder pain after replacement; History of total shoulder replacement (Right); History of total replacement of shoulder joints (Bilateral); Cervical spondylosis with myelopathy and radiculopathy; Chronic hip pain (Right); Osteoarthritis of hip (Right); Chronic sacroiliac joint pain (Left); Other spondylosis, sacral and sacrococcygeal region; Sacroiliac joint dysfunction (Left); Somatic dysfunction of sacroiliac joint (Left); Enthesopathy of sacroiliac joint (Left); DDD (degenerative disc disease), cervical; DDD (degenerative disc disease), lumbosacral; Other intervertebral disc degeneration, lumbar region; Acetabular labrum tear, sequela (Left); Acetabular labrum tear, sequela (Right); Greater trochanteric bursitis of hips (Bilateral); Epidural fibrosis (Left: L5-S1); Chronic groin pain (Bilateral); Abnormal MRI, lumbar spine (09/28/2020); and Abnormal MRI, hip (Bilateral) (10/04/2020) on their pertinent problem list. Pain Assessment: Severity of Chronic pain is reported as a 6 /10. Location: Hip Right,Left/pain radiaties down legs at times. Onset: More than a month ago. Quality: Sharp,Stabbing,Aching,Constant,Dull. Timing: Constant. Modifying factor(s): nothing. Vitals:  height is '5\' 5"'  (1.651 m) and weight is 248 lb (112.5 kg). Her temperature is 97 F (36.1 C) (abnormal). Her blood pressure is 113/73 and her pulse is 91. Her oxygen saturation is 96%.   Reason for encounter: follow-up evaluation after bilateral hip MRI and lumbar MRI.   (10/04/2020) HIP MRI FINDINGS (Bilateral): Articular cartilage: Mildly degenerated on the right and left without focal defect. Labrum: Degenerative tearing of the left anterior  and superior labrum. Also seen is degenerative tearing of the right anterior labrum. Bursae: Fluid and calcifications are seen in the left trochanteric bursa. The calcifications are present on the 2019 plain films. There is a small volume of fluid but no calcifications in the right trochanteric bursa.  IMPRESSION: (L>R) Left worse than right hip degenerative disease with  associated tearing of the labrum, more extensive on the left as described above. (L>R) Left worse than right trochanteric bursitis. Calcifications in the left trochanteric bursa are chronic.   (09/28/2020) LUMBAR MRI FINDINGS: Alignment: Grade 1 anterolisthesis of L4 over L5, progressed since prior MRI. Vertebrae: Endplate degenerative changes at T12-L1 and L5-S1.  DISC LEVELS: T12-L1: Posterior disc protrusion causes small indentation of the thecal sac  L3-4: Disc bulge mild-to-moderate facet degenerative changes with mild ligamentum flavum redundancy resulting in mild bilateral neural foraminal narrowing. L4-5: Postsurgical changes from prior laminectomy. Disc bulge/disc uncovering and advanced facet degenerative changes. Again seen is retraction of the thecal sac towards the laminectomy, unchanged. L5-S1: Postsurgical changes from prior laminectomy. T1 hypointense tissue surrounds the traversing left S1 nerve root in the left subarticular zone, suggestive of peridural fibrosis. Loss of disc height, disc bulge with superimposed small central disc protrusion contacting the bilateral traversing S1 nerve roots in causing mild narrowing of the bilateral subarticular zones. Moderate facet degenerative changes. Mild bilateral neural foraminal narrowing retraction of the thecal sac towards the laminectomy is unchanged.  IMPRESSION: 1. Interval progression of grade 1 anterolisthesis of L4 over L5. 2. Postsurgical changes from prior L4-L5 and L5-S1 laminectomies with T1 hypointense tissue in the left subarticular zone at L5-S1,  suggestive of peridural fibrosis. 3. Small central disc protrusion at L5-S1 contacts the bilateral traversing S1 nerve roots in the subarticular zones. 4. Mild bilateral neural foraminal narrowing at L3-L4 and L5-S1.  The above information has been provided to the patient in simplified layman's terms.  Today I will be referring the patient to orthopedic surgery for evaluation for possible hip surgery.  Today I provided the patient with copies of her hip and lumbar MRI.  I also provided the patient with written information regarding the intrathecal pumps and spinal cord stimulators.  The patient was encouraged to "google" the implants and to contact me again with questions that she may have regarding those.  Pharmacotherapy Assessment   Analgesic: Oxycodone IR 10 mg, 1 tab PO q 6 hrs (40 mg/dayof oxycodone) MME/day:61m/day.   Monitoring: [place] PMP: PDMP not reviewed this encounter.       Pharmacotherapy: No side-effects or adverse reactions reported. Compliance: No problems identified. Effectiveness: Clinically acceptable.  BChauncey Fischer RN  10/09/2020  8:16 AM  Sign when Signing Visit Safety precautions to be maintained throughout the outpatient stay will include: orient to surroundings, keep bed in low position, maintain call bell within reach at all times, provide assistance with transfer out of bed and ambulation.     UDS:  Summary  Date Value Ref Range Status  01/13/2020 Note  Final    Comment:    ==================================================================== [employer] [facility] (MW) ==================================================================== Test                             Result       Flag       Units  Drug Present and Declared for Prescription Verification   Oxycodone                      >4587        EXPECTED   ng/mg creat   Oxymorphone                    319          EXPECTED   ng/mg creat   Noroxycodone                   >  4587        EXPECTED   ng/mg  creat   Noroxymorphone                 79           EXPECTED   ng/mg creat    Sources of oxycodone are scheduled prescription medications.    Oxymorphone, noroxycodone, and noroxymorphone are expected    metabolites of oxycodone. Oxymorphone is also available as a    scheduled prescription medication.  ==================================================================== Test                      Result    Flag   Units      Ref Range   Creatinine              218              mg/dL      >=20 ==================================================================== Declared Medications:  The flagging and interpretation on this report are based on the  following declared medications.  Unexpected results may arise from  inaccuracies in the declared medications.   **Note: The testing scope of this panel includes these medications:   Oxycodone   **Note: The testing scope of this panel does not include the  following reported medications:   Albuterol (Ventolin HFA)  Citalopram (Celexa)  Diclofenac (Voltaren)  Docusate (Colace)  Fexofenadine (Allegra-D)  Fluticasone (Advair)  Guaifenesin (Mucinex)  Levothyroxine (Synthroid)  Magnesium (Milk of Magnesia)  Naloxone (Narcan)  Nystatin (Nystop)  Pseudoephedrine (Allegra-D)  Salmeterol (Advair)  Tiotropium (Spiriva)  Tizanidine (Zanaflex)  Varenicline (Chantix)  Vitamin D ==================================================================== For clinical consultation, please call [phone]. ====================================================================      ROS  Constitutional: Denies any fever or chills Gastrointestinal: No reported hemesis, hematochezia, vomiting, or acute GI distress Musculoskeletal: Denies any acute onset joint swelling, redness, loss of ROM, or weakness Neurological: No reported episodes of acute onset apraxia, aphasia, dysarthria, agnosia, amnesia, paralysis, loss of coordination, or loss of  consciousness  Medication Review  Fluticasone-Umeclidin-Vilant, Olopatadine HCl, Oxycodone HCl, albuterol, cetirizine, cholecalciferol, citalopram, diclofenac Sodium, docusate sodium, guaiFENesin, levothyroxine, magnesium hydroxide, naloxone, nystatin, and tiZANidine  History Review  Allergy: Ms. Schweigert is allergic to aripiprazole, duloxetine, gabapentin, gold, iodinated diagnostic agents, naproxen sodium, nortriptyline hcl, nsaids, pregabalin, meperidine, ace inhibitors, aspirin, cefpodoxime, fluoxetine, fluticasone-salmeterol, levofloxacin, lithium, paroxetine, tape, telithromycin, theophylline, topiramate, trazodone, triamcinolone, venlafaxine, bupropion, ketorolac tromethamine, morphine, and moxifloxacin. Drug: Ms. Rogalski  reports no history of drug use. Alcohol:  reports no history of alcohol use. Tobacco:  reports that she has been smoking cigarettes. She has a 80.00 pack-year smoking history. She has never used smokeless tobacco. Social: Ms. Gravelle  reports that she has been smoking cigarettes. She has a 80.00 pack-year smoking history. She has never used smokeless tobacco. She reports that she does not drink alcohol and does not use drugs. Medical:  has a past medical history of Acute postoperative pain (01/13/2017), Anxiety, Arthritis, Asthma, Bell's palsy, Bursitis, COPD (chronic obstructive pulmonary disease) ([place]), Depression, Fibromyalgia, Heart murmur, Hepatitis C, Hiatal hernia, Hyperlipidemia, Hypertension, IBS (irritable bowel syndrome), Insomnia, and Thyroid disease. Surgical: Ms. Hammonds  has a past surgical history that includes necksurgery (10/21/2014); Back surgery (2013); Foot surgery (Right); Elbow surgery (Left); Carpal tunnel release (Right); Nose surgery; Partial hysterectomy; Total shoulder replacement (Bilateral); and Colonoscopy with propofol (N/A, 02/05/2017). Family: family history includes Cancer in her mother; Gout in her mother; Heart disease in her  father.  Laboratory Chemistry Profile  Renal Lab Results  Component Value Date   BUN 14 04/28/2019   CREATININE 0.98 04/28/2019   BCR 13 02/16/2019   GFRAA >60 04/28/2019   GFRNONAA >60 04/28/2019     Hepatic Lab Results  Component Value Date   AST 18 04/28/2019   ALT 16 04/28/2019   ALBUMIN 3.7 04/28/2019   ALKPHOS 80 04/28/2019     Electrolytes Lab Results  Component Value Date   NA 139 04/28/2019   K 4.1 04/28/2019   CL 105 04/28/2019   CALCIUM 9.3 04/28/2019   MG 2.1 02/16/2019     Bone Lab Results  Component Value Date   25OHVITD1 27 (L) 02/16/2019   25OHVITD2 2.0 02/16/2019   25OHVITD3 25 02/16/2019     Inflammation (CRP: Acute Phase) (ESR: Chronic Phase) Lab Results  Component Value Date   CRP 2 02/16/2019   ESRSEDRATE 23 02/16/2019       Note: Above Lab results reviewed.  Recent Imaging Review  MR HIP LEFT WO CONTRAST CLINICAL DATA:  Chronic bilateral hip and groin pain. No known injury.  EXAM: MR OF THE BILATERAL HIPS WITHOUT CONTRAST  TECHNIQUE: Multiplanar, multisequence MR imaging was performed. No intravenous contrast was administered.  COMPARISON:  Plain films left pelvis and hip 05/18/2018. Plain films right hip 05/23/2020.  FINDINGS: Bones: There is no fracture, stress change or worrisome marrow lesion. No avascular necrosis of the femoral heads. No subchondral cyst formation or edema about either hip.  Articular cartilage and labrum  Articular cartilage: Mildly degenerated on the right and left without focal defect.  Labrum: There is degenerative tearing of the left anterior and superior labrum. Also seen is degenerative tearing of the right anterior labrum.  Joint or bursal effusion  Joint effusion:  None.  Bursae: Fluid and calcifications are seen in the left trochanteric bursa. The calcifications are present on the 2019 plain films. There is a small volume of fluid but no calcifications in the right trochanteric  bursa.  Muscles and tendons  Muscles and tendons:  Intact.  Other findings  Miscellaneous: Imaged intrapelvic contents demonstrate no acute or focal abnormality.  IMPRESSION: Left worse than right hip degenerative disease with associated tearing of the labrum, more extensive on the left as described above.  Left worse than right trochanteric bursitis. Calcifications in the left trochanteric bursa are chronic.  Electronically Signed   By: Inge Rise M.D.   On: 10/04/2020 11:33 MR HIP RIGHT WO CONTRAST CLINICAL DATA:  Chronic bilateral hip and groin pain. No known injury.  EXAM: MR OF THE BILATERAL HIPS WITHOUT CONTRAST  TECHNIQUE: Multiplanar, multisequence MR imaging was performed. No intravenous contrast was administered.  COMPARISON:  Plain films left pelvis and hip 05/18/2018. Plain films right hip 05/23/2020.  FINDINGS: Bones: There is no fracture, stress change or worrisome marrow lesion. No avascular necrosis of the femoral heads. No subchondral cyst formation or edema about either hip.  Articular cartilage and labrum  Articular cartilage: Mildly degenerated on the right and left without focal defect.  Labrum: There is degenerative tearing of the left anterior and superior labrum. Also seen is degenerative tearing of the right anterior labrum.  Joint or bursal effusion  Joint effusion:  None.  Bursae: Fluid and calcifications are seen in the left trochanteric bursa. The calcifications are present on the 2019 plain films. There is a small volume of fluid but no calcifications in the right trochanteric bursa.  Muscles and tendons  Muscles and tendons:  Intact.  Other findings  Miscellaneous: Imaged intrapelvic contents demonstrate no acute or focal abnormality.  IMPRESSION: Left worse than right hip degenerative disease with associated tearing of the labrum, more extensive on the left as described above.  Left worse than right  trochanteric bursitis. Calcifications in the left trochanteric bursa are chronic.  Electronically Signed   By: Inge Rise M.D.   On: 10/04/2020 11:33 Note: Reviewed        Physical Exam  General appearance: Well nourished, well developed, and well hydrated. In no apparent acute distress Mental status: Alert, oriented x 3 (person, place, & time)       Respiratory: No evidence of acute respiratory distress Eyes: PERLA Vitals: BP 113/73   Pulse 91   Temp (!) 97 F (36.1 C)   Ht '5\' 5"'  (1.651 m)   Wt 248 lb (112.5 kg)   SpO2 96%   BMI 41.27 kg/m  BMI: Estimated body mass index is 41.27 kg/m as calculated from the following:   Height as of this encounter: '5\' 5"'  (1.651 m).   Weight as of this encounter: 248 lb (112.5 kg). Ideal: Ideal body weight: 57 kg (125 lb 10.6 oz) Adjusted ideal body weight: 79.2 kg (174 lb 9.6 oz)  Assessment   Status Diagnosis  Controlled Controlled Controlled 1. Chronic pain syndrome   2. 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)   3. Chronic groin pain (Bilateral)   4. Acetabular labrum tear, sequela (Left)   5. Acetabular labrum tear, sequela (Right)   6. Chronic trochanteric bursitis (Bilateral)   7. Greater trochanteric bursitis of hips (Bilateral)   8. Failed back surgical syndrome   9. Epidural fibrosis (Left: L5-S1)   10. Chronic low back pain (3ry area of Pain) (Bilateral) (R>L) w/ sciatica (Right)   11. Chronic lower extremity pain (Right)   12. Abnormal MRI, lumbar spine (09/28/2020)   13. Abnormal MRI, hip (Bilateral) (10/04/2020)      Updated Problems: Problem  Acetabular labrum tear, sequela (Left)  Acetabular labrum tear, sequela (Right)  Greater trochanteric bursitis of hips (Bilateral)   (L>R) Left worse than right trochanteric bursitis. Calcifications in the left trochanteric bursa are chronic.   Epidural fibrosis (Left: L5-S1)   Postsurgical changes from prior L4-L5 and L5-S1 laminectomies with T1 hypointense tissue in  the left subarticular zone at L5-S1, suggestive of peridural fibrosis.   Chronic groin pain (Bilateral)  Abnormal MRI, lumbar spine (09/28/2020)   (09/28/2020) LUMBAR MRI FINDINGS: Alignment: Grade 1 anterolisthesis of L4 over L5, progressed since prior MRI. Vertebrae: Endplate degenerative changes at T12-L1 and L5-S1.  DISC LEVELS: T12-L1: Posterior disc protrusion causes small indentation of the thecal sac  L3-4: Disc bulge mild-to-moderate facet degenerative changes with mild ligamentum flavum redundancy resulting in mild bilateral neural foraminal narrowing. L4-5: Postsurgical changes from prior laminectomy. Disc bulge/disc uncovering and advanced facet degenerative changes. Again seen is retraction of the thecal sac towards the laminectomy, unchanged. L5-S1: Postsurgical changes from prior laminectomy. T1 hypointense tissue surrounds the traversing left S1 nerve root in the left subarticular zone, suggestive of peridural fibrosis. Loss of disc height, disc bulge with superimposed small central disc protrusion contacting the bilateral traversing S1 nerve roots in causing mild narrowing of the bilateral subarticular zones. Moderate facet degenerative changes. Mild bilateral neural foraminal narrowing retraction of the thecal sac towards the laminectomy is unchanged.  IMPRESSION: 1. Interval progression of grade 1 anterolisthesis of L4 over L5. 2. Postsurgical changes from prior L4-L5 and L5-S1 laminectomies with T1 hypointense tissue in the left subarticular  zone at L5-S1, suggestive of peridural fibrosis. 3. Small central disc protrusion at L5-S1 contacts the bilateral traversing S1 nerve roots in the subarticular zones. 4. Mild bilateral neural foraminal narrowing at L3-L4 and L5-S1.   Abnormal MRI, hip (Bilateral) (10/04/2020)   (10/04/2020) HIP MRI FINDINGS (Bilateral): Articular cartilage: Mildly degenerated on the right and left without focal defect. Labrum: Degenerative tearing of  the left anterior and superior labrum. Also seen is degenerative tearing of the right anterior labrum. Bursae: Fluid and calcifications are seen in the left trochanteric bursa. The calcifications are present on the 2019 plain films. There is a small volume of fluid but no calcifications in the right trochanteric bursa.  IMPRESSION: (L>R) Left worse than right hip degenerative disease with associated tearing of the labrum, more extensive on the left as described above. (L>R) Left worse than right trochanteric bursitis. Calcifications in the left trochanteric bursa are chronic.   Other Intervertebral Disc Degeneration, Lumbar Region  Chronic trochanteric bursitis (Bilateral)   (L>R) Left worse than right trochanteric bursitis. Calcifications in the left trochanteric bursa are chronic.   Chronic low back pain (3ry area of Pain) (Bilateral) (R>L) w/ sciatica (Right)    Plan of Care  Problem-specific:  No problem-specific Assessment & Plan notes found for this encounter.  Ms. BRITIANY SILBERNAGEL has a current medication list which includes the following long-term medication(s): albuterol, citalopram, levothyroxine, olopatadine hcl, oxycodone hcl, [START ON 10/13/2020] oxycodone hcl, [START ON 11/12/2020] oxycodone hcl, diclofenac sodium, and tizanidine.  Pharmacotherapy (Medications Ordered): No orders of the defined types were placed in this encounter.  Orders:  Orders Placed This Encounter  Procedures  . Ambulatory referral to Orthopedic Surgery    Referral Priority:   Routine    Referral Type:   Surgical    Referral Reason:   Specialty Services Required    Referred to Provider:   Dereck Leep, MD    Requested Specialty:   Orthopedic Surgery    Number of Visits Requested:   1   Follow-up plan:   Return for scheduled encounter.      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  Allergic to NSAIDs.  Abnormal  UDS (01/02/2016). (+) Unreported tramadol.   Planned  Pending:   Pending further evaluation   Under consideration:   Possible bilateral hip jointRFA Diagnostic caudal ESI + epidurogram  Possible Racz procedure.  Possible bilateral cervical facetRFA Diagnostic right CESI  Diagnostic left sacroiliac joint block Possible bilateral sacroiliac joint RFA   Completed:   Palliative bilateral trochanteric bursa x1  Palliative bilateralsuprascapular nerve block x3(08/08/2020) Palliative bilateral suprascapular nerveRFA x1(05/13/2019) Diagnostic right IA hip joint injection x3 (06/01/2020)  Diagnostic left IA hip joint injection x2 (04/30/2018)  Diagnostic bilateral lumbar facet block x3 Palliative right lumbar facet RFA x2(12/16/2017)  Palliative left lumbar facet RFA x2(02/12/2018)  Diagnostic bilateral cervical facet block x1 Diagnostic right SI joint block x1   Therapeutic  Palliative (PRN) options:   Palliative bilateral trochanteric bursa #2  Palliative bilateralsuprascapular nerve block #4 Palliative bilateral suprascapular nerveRFA #2 Diagnostic right IA hip joint injection #4 Diagnostic left IA hip joint injection #3 Diagnostic bilateral lumbar facet block #4 Palliative right lumbar facet RFA #3 Palliative left lumbar facet RFA #3 Diagnostic bilateral cervical facet block #2 Diagnostic right SI joint block #2     Recent Visits Date Type Provider Dept  09/11/20 Office Visit Milinda Pointer, [facility]  08/08/20 Procedure visit  Milinda Pointer, MD [facility]  07/20/20 Telemedicine Milinda Pointer, MD [facility]  Showing recent visits within past 90 days and meeting all other requirements Today's Visits Date Type Provider Dept  10/09/20 Office Visit Milinda Pointer, MD [facility]  Showing today's visits and meeting all other requirements Future Appointments Date Type Provider Dept  12/06/20  Appointment Milinda Pointer, MD [facility]  Showing future appointments within next 90 days and meeting all other requirements  I discussed the assessment and treatment plan with the patient. The patient was provided an opportunity to ask questions and all were answered. The patient agreed with the plan and demonstrated an understanding of the instructions.  Patient advised to call back or seek an in-person evaluation if the symptoms or condition worsens.  Duration of encounter: 30 minutes.  Note by: Gaspar Cola, MD Date: 10/09/2020; Time: 8:59 AM

## 2020-10-27 ENCOUNTER — Encounter: Payer: Self-pay | Admitting: *Deleted

## 2020-11-14 ENCOUNTER — Other Ambulatory Visit: Payer: Self-pay | Admitting: Pain Medicine

## 2020-11-14 DIAGNOSIS — G894 Chronic pain syndrome: Secondary | ICD-10-CM

## 2020-11-28 ENCOUNTER — Encounter: Payer: Medicaid Other | Admitting: Pain Medicine

## 2020-11-28 DIAGNOSIS — Z79891 Long term (current) use of opiate analgesic: Secondary | ICD-10-CM | POA: Insufficient documentation

## 2020-11-28 NOTE — Progress Notes (Signed)
PROVIDER NOTE: Information contained herein reflects review and annotations entered in association with encounter. Interpretation of such information and data should be left to medically-trained personnel. Information provided to patient can be located elsewhere in the medical record under "Patient Instructions". Document created using STT-dictation technology, any transcriptional errors that may result from process are unintentional.    Patient: Danielle [name]  Service Category: E/M  Provider: Gaspar Cola, MD  DOB: 1961-05-09  DOS: 11/29/2020  Specialty: Interventional Pain Management  MRN: [id number]  Setting: Ambulatory outpatient  PCP: Danae Orleans, MD  Type: Established Patient    Referring Provider: Danae Orleans, MD  Location: Office  Delivery: Face-to-face     HPI  Danielle [name], a 60 y.o. year old female, is here today because of her Greater trochanteric bursitis of both hips [M70.61, M70.62]. Danielle [name] primary complain today is Back Pain (low), Hip Pain (blateral), and Shoulder Pain (bilateral) Last encounter: My last encounter with her was on 11/14/2020. Pertinent problems: Danielle [name] has Cervical spinal cord compression [facility]) (April, 2017); Cervical spinal stenosis; Chronic shoulder pain (1ry area of Pain) (Bilateral) (R>L); Chronic low back pain (3ry area of Pain) (Bilateral) (R>L) w/ sciatica (Right); Lumbar facet syndrome (Bilateral) (R>L); Lumbar spondylosis; Failed back surgical syndrome; Chronic lower extremity pain (Right); Chronic neck pain (Bilateral) (R>L); Chronic cervical radicular pain (Right); Ulnar neuropathy (Left); Hx of cervical spine surgery; Cervical spondylosis; Fibromyalgia; Chronic sacroiliac pain (Bilateral) (L>R); History of total shoulder replacement (Left); Chronic pain syndrome; Complaints of weakness of lower extremity; Anterolisthesis of lumbar spine (L4/L5); Cervical facet syndrome ([place]); Chronic musculoskeletal pain; Muscle spasm of back;  Myofascial pain; Chronic hip pain (2ry area of Pain) (Bilateral) (R>L); 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); Trigger point with back pain; Osteoarthritis of shoulder (Bilateral); Spondylosis without myelopathy or radiculopathy, lumbosacral region; Cellulitis of leg, right; Chronic trochanteric bursitis (Bilateral); Osteoarthritis involving multiple joints; Chronic shoulder pain after replacement; History of total shoulder replacement (Right); History of total replacement of shoulder joints (Bilateral); Cervical spondylosis with myelopathy and radiculopathy; Chronic hip pain (Right); Osteoarthritis of hip (Right); Chronic sacroiliac joint pain (Left); Other spondylosis, sacral and sacrococcygeal region; Sacroiliac joint dysfunction (Left); Somatic dysfunction of sacroiliac joint (Left); Enthesopathy of sacroiliac joint (Left); DDD (degenerative disc disease), cervical; DDD (degenerative disc disease), lumbosacral; Other intervertebral disc degeneration, lumbar region; Acetabular labrum tear, sequela (Left); Acetabular labrum tear, sequela (Right); Greater trochanteric bursitis of hips (Bilateral); Epidural fibrosis (Left: L5-S1); Chronic groin pain (Bilateral); Abnormal MRI, lumbar spine (09/28/2020); and Abnormal MRI, hip (Bilateral) (10/04/2020) on their pertinent problem list. Pain Assessment: Severity of Chronic pain is reported as a 1 /10. Location: Back Lower/hips. Onset: More than a month ago. Quality: Burning,Aching,Contraction,Stabbing,Shooting. Timing: Constant. Modifying factor(s): medications, topicals, rest, ice. Vitals:  height is '5\' 5"'  (1.651 m) and weight is 240 lb (108.9 kg). Her temporal temperature is 97.2 F (36.2 C) (abnormal). Her blood pressure is 144/99 (abnormal). Her respiration is 18 and oxygen saturation is 97%.   Reason for encounter: medication management.   The patient indicates doing well with the current medication regimen. No adverse reactions or side effects reported to the  medications.   The patient comes into the clinic today accompanied by her husband and they had several questions regarding the intrathecal pump implant.  This is an option that I had mentioned to her in the past, but in reviewing her concerns, she has valid reasons to stay away from it.  One of them being that it would not  call or the pain above the thoracic area as she would like in she may have to continue on oral medications.  If we can do this, we met as well continue with the medicines and avoid the implants since she is not having any problems with those medicines.  The only time that she feels that the medicines do not do their job is whenever she is having a flareup of her pain.  Currently she is having some issues with pain in her hips and trochanteric bursa and she would like for those to be injected.  She did go to her orthopedic surgery appointment, but they suggested that she stay away from the hip surgery for as long as she can.  They also suggested that she lose some weight and that she have some injections done.  They went ahead and refer her to another practice for those injections, but I have reminded the patient that we do not mind if they provide a second opinion as to what we are doing, but if we are managing the opioid analgesics, we will also continue to manage her interventional therapies.  I made it very clear to her that I do not restrict anybody from going elsewhere to have the injections done, but they also need to understand that should they take over the interventional pain management, they also need to take over the pharmacological pain management and therefore we would transfer all of her opioid analgesics to general practitioner.  She indicated that she does not want to go anywhere else since she asked him they are not willing to do the injections with sedation as we do.  RTCB: 03/12/2021 Nonopioids transferred 05/15/2020: Zanaflex and Voltaren gel  Pharmacotherapy  Assessment   Analgesic: Oxycodone IR 10 mg, 1 tab PO q 6 hrs (40 mg/dayof oxycodone) MME/day:64m/day.   Monitoring: [place] PMP: PDMP reviewed during this encounter.       Pharmacotherapy: No side-effects or adverse reactions reported. Compliance: No problems identified. Effectiveness: Clinically acceptable.  SHart Rochester RN  11/29/2020 12:36 PM  Signed Nursing Pain Medication Assessment:  Safety precautions to be maintained throughout the outpatient stay will include: orient to surroundings, keep bed in low position, maintain call bell within reach at all times, provide assistance with transfer out of bed and ambulation.  Medication Inspection Compliance: Pill count conducted under aseptic conditions, in front of the patient. Neither the pills nor the bottle was removed from the patient's sight at any time. Once count was completed pills were immediately returned to the patient in their original bottle.  Medication: Oxycodone IR Pill/Patch Count: 72 of 120 pills remain Pill/Patch Appearance: Markings consistent with prescribed medication Bottle Appearance: Standard pharmacy container. Clearly labeled. Filled Date: 05 / 02 / 2022 Last Medication intake:  Today    UDS:  Summary  Date Value Ref Range Status  01/13/2020 Note  Final    Comment:    ==================================================================== [employer] Select 13 (MW) ==================================================================== Test                             Result       Flag       Units  Drug Present and Declared for Prescription Verification   Oxycodone                      >4587        EXPECTED   ng/mg creat   Oxymorphone  319          EXPECTED   ng/mg creat   Noroxycodone                   >4587        EXPECTED   ng/mg creat   Noroxymorphone                 79           EXPECTED   ng/mg creat    Sources of oxycodone are scheduled prescription medications.    Oxymorphone,  noroxycodone, and noroxymorphone are expected    metabolites of oxycodone. Oxymorphone is also available as a    scheduled prescription medication.  ==================================================================== Test                      Result    Flag   Units      Ref Range   Creatinine              218              mg/dL      >=20 ==================================================================== Declared Medications:  The flagging and interpretation on this report are based on the  following declared medications.  Unexpected results may arise from  inaccuracies in the declared medications.   **Note: The testing scope of this panel includes these medications:   Oxycodone   **Note: The testing scope of this panel does not include the  following reported medications:   Albuterol (Ventolin HFA)  Citalopram (Celexa)  Diclofenac (Voltaren)  Docusate (Colace)  Fexofenadine (Allegra-D)  Fluticasone (Advair)  Guaifenesin (Mucinex)  Levothyroxine (Synthroid)  Magnesium (Milk of Magnesia)  Naloxone (Narcan)  Nystatin (Nystop)  Pseudoephedrine (Allegra-D)  Salmeterol (Advair)  Tiotropium (Spiriva)  Tizanidine (Zanaflex)  Varenicline (Chantix)  Vitamin D ==================================================================== For clinical consultation, please call [phone]. ====================================================================      ROS  Constitutional: Denies any fever or chills Gastrointestinal: No reported hemesis, hematochezia, vomiting, or acute GI distress Musculoskeletal: Denies any acute onset joint swelling, redness, loss of ROM, or weakness Neurological: No reported episodes of acute onset apraxia, aphasia, dysarthria, agnosia, amnesia, paralysis, loss of coordination, or loss of consciousness  Medication Review  Fluticasone-Umeclidin-Vilant, Olopatadine HCl, Oxycodone HCl, albuterol, cetirizine, cholecalciferol, citalopram, diclofenac Sodium,  docusate sodium, levothyroxine, magnesium hydroxide, naloxone, nystatin, and tiZANidine  History Review  Allergy: Danielle [name] is allergic to aripiprazole, duloxetine, gabapentin, gold, iodinated diagnostic agents, naproxen sodium, nortriptyline hcl, nsaids, pregabalin, meperidine, ace inhibitors, aspirin, cefpodoxime, fluoxetine, fluticasone-salmeterol, levofloxacin, lithium, paroxetine, tape, telithromycin, theophylline, topiramate, trazodone, triamcinolone, venlafaxine, bupropion, ketorolac tromethamine, morphine, and moxifloxacin. Drug: Danielle [name]  reports no history of drug use. Alcohol:  reports no history of alcohol use. Tobacco:  reports that she has been smoking cigarettes. She has a 80.00 pack-year smoking history. She has never used smokeless tobacco. Social: Danielle [name]  reports that she has been smoking cigarettes. She has a 80.00 pack-year smoking history. She has never used smokeless tobacco. She reports that she does not drink alcohol and does not use drugs. Medical:  has a past medical history of Acute postoperative pain (01/13/2017), Anxiety, Arthritis, Asthma, Bell's palsy, Bursitis, COPD (chronic obstructive pulmonary disease) ([place]), Depression, Fibromyalgia, Heart murmur, Hepatitis C, Hiatal hernia, Hyperlipidemia, Hypertension, IBS (irritable bowel syndrome), Insomnia, and Thyroid disease. Surgical: Danielle [name]  has a past surgical history that includes necksurgery (10/21/2014); Back surgery (2013); Foot surgery (Right); Elbow surgery (Left); Carpal tunnel release (Right); Nose surgery; Partial  hysterectomy; Total shoulder replacement (Bilateral); and Colonoscopy with propofol (N/A, 02/05/2017). Family: family history includes Cancer in her mother; Gout in her mother; Heart disease in her father.  Laboratory Chemistry Profile   Renal Lab Results  Component Value Date   BUN 14 04/28/2019   CREATININE 0.98 04/28/2019   BCR 13 02/16/2019   GFRAA >60 04/28/2019   GFRNONAA >60  04/28/2019     Hepatic Lab Results  Component Value Date   AST 18 04/28/2019   ALT 16 04/28/2019   ALBUMIN 3.7 04/28/2019   ALKPHOS 80 04/28/2019     Electrolytes Lab Results  Component Value Date   NA 139 04/28/2019   K 4.1 04/28/2019   CL 105 04/28/2019   CALCIUM 9.3 04/28/2019   MG 2.1 02/16/2019     Bone Lab Results  Component Value Date   25OHVITD1 27 (L) 02/16/2019   25OHVITD2 2.0 02/16/2019   25OHVITD3 25 02/16/2019     Inflammation (CRP: Acute Phase) (ESR: Chronic Phase) Lab Results  Component Value Date   CRP 2 02/16/2019   ESRSEDRATE 23 02/16/2019       Note: Above Lab results reviewed.  Recent Imaging Review  MR HIP LEFT WO CONTRAST CLINICAL DATA:  Chronic bilateral hip and groin pain. No known injury.  EXAM: MR OF THE BILATERAL HIPS WITHOUT CONTRAST  TECHNIQUE: Multiplanar, multisequence MR imaging was performed. No intravenous contrast was administered.  COMPARISON:  Plain films left pelvis and hip 05/18/2018. Plain films right hip 05/23/2020.  FINDINGS: Bones: There is no fracture, stress change or worrisome marrow lesion. No avascular necrosis of the femoral heads. No subchondral cyst formation or edema about either hip.  Articular cartilage and labrum  Articular cartilage: Mildly degenerated on the right and left without focal defect.  Labrum: There is degenerative tearing of the left anterior and superior labrum. Also seen is degenerative tearing of the right anterior labrum.  Joint or bursal effusion  Joint effusion:  None.  Bursae: Fluid and calcifications are seen in the left trochanteric bursa. The calcifications are present on the 2019 plain films. There is a small volume of fluid but no calcifications in the right trochanteric bursa.  Muscles and tendons  Muscles and tendons:  Intact.  Other findings  Miscellaneous: Imaged intrapelvic contents demonstrate no acute or focal abnormality.  IMPRESSION: Left worse  than right hip degenerative disease with associated tearing of the labrum, more extensive on the left as described above.  Left worse than right trochanteric bursitis. Calcifications in the left trochanteric bursa are chronic.  Electronically Signed   By: Inge Rise M.D.   On: 10/04/2020 11:33 MR HIP RIGHT WO CONTRAST CLINICAL DATA:  Chronic bilateral hip and groin pain. No known injury.  EXAM: MR OF THE BILATERAL HIPS WITHOUT CONTRAST  TECHNIQUE: Multiplanar, multisequence MR imaging was performed. No intravenous contrast was administered.  COMPARISON:  Plain films left pelvis and hip 05/18/2018. Plain films right hip 05/23/2020.  FINDINGS: Bones: There is no fracture, stress change or worrisome marrow lesion. No avascular necrosis of the femoral heads. No subchondral cyst formation or edema about either hip.  Articular cartilage and labrum  Articular cartilage: Mildly degenerated on the right and left without focal defect.  Labrum: There is degenerative tearing of the left anterior and superior labrum. Also seen is degenerative tearing of the right anterior labrum.  Joint or bursal effusion  Joint effusion:  None.  Bursae: Fluid and calcifications are seen in the left trochanteric bursa. The calcifications  are present on the 2019 plain films. There is a small volume of fluid but no calcifications in the right trochanteric bursa.  Muscles and tendons  Muscles and tendons:  Intact.  Other findings  Miscellaneous: Imaged intrapelvic contents demonstrate no acute or focal abnormality.  IMPRESSION: Left worse than right hip degenerative disease with associated tearing of the labrum, more extensive on the left as described above.  Left worse than right trochanteric bursitis. Calcifications in the left trochanteric bursa are chronic.  Electronically Signed   By: Inge Rise M.D.   On: 10/04/2020 11:33 Note: Reviewed        Physical Exam   General appearance: Well nourished, well developed, and well hydrated. In no apparent acute distress Mental status: Alert, oriented x 3 (person, place, & time)       Respiratory: No evidence of acute respiratory distress Eyes: PERLA Vitals: BP (!) 144/99   Temp (!) 97.2 F (36.2 C) (Temporal)   Resp 18   Ht '5\' 5"'  (1.651 m)   Wt 240 lb (108.9 kg)   SpO2 97%   BMI 39.94 kg/m  BMI: Estimated body mass index is 39.94 kg/m as calculated from the following:   Height as of this encounter: '5\' 5"'  (1.651 m).   Weight as of this encounter: 240 lb (108.9 kg). Ideal: Ideal body weight: 57 kg (125 lb 10.6 oz) Adjusted ideal body weight: 77.7 kg (171 lb 6.4 oz)  Assessment   Status Diagnosis  Controlled Controlled Controlled 1. Greater trochanteric bursitis of hips (Bilateral)   2. Acetabular labrum tear, sequela (Left)   3. Acetabular labrum tear, sequela (Right)   4. Abnormal MRI, hip (Bilateral) (10/04/2020)   5. Chronic shoulder pain (1ry area of Pain) (Bilateral) (R>L)   6. Chronic hip pain (2ry area of Pain) (Bilateral) (R>L)   7. Chronic low back pain (3ry area of Pain) (Bilateral) (R>L) w/ sciatica (Right)   8. Chronic cervical radicular pain (Right)   9. Chronic pain syndrome   10. Pharmacologic therapy   11. Chronic use of opiate for therapeutic purpose   12. Uncomplicated opioid dependence ([place])      Updated Problems: No problems updated.  Plan of Care  Problem-specific:  No problem-specific Assessment & Plan notes found for this encounter.  Danielle [name] has a current medication list which includes the following long-term medication(s): albuterol, citalopram, diclofenac sodium, levothyroxine, olopatadine hcl, oxycodone hcl, [START ON 12/12/2020] oxycodone hcl, [START ON 01/11/2021] oxycodone hcl, [START ON 02/10/2021] oxycodone hcl, and tizanidine.  Pharmacotherapy (Medications Ordered): Meds ordered this encounter  Medications  . Oxycodone HCl 10 MG TABS     Sig: Take 1 tablet (10 mg total) by mouth every 6 (six) hours as needed. Must last 30 days    Dispense:  120 tablet    Refill:  0    Not a duplicate. Do NOT delete! Dispense 1 day early if closed on refill date. Avoid benzodiazepines within 8 hours of opioids. Do not send refill requests.  . Oxycodone HCl 10 MG TABS    Sig: Take 1 tablet (10 mg total) by mouth every 6 (six) hours as needed. Must last 30 days    Dispense:  120 tablet    Refill:  0    Not a duplicate. Do NOT delete! Dispense 1 day early if closed on refill date. Avoid benzodiazepines within 8 hours of opioids. Do not send refill requests.  . Oxycodone HCl 10 MG TABS    Sig: Take  1 tablet (10 mg total) by mouth every 6 (six) hours as needed. Must last 30 days    Dispense:  120 tablet    Refill:  0    Not a duplicate. Do NOT delete! Dispense 1 day early if closed on refill date. Avoid benzodiazepines within 8 hours of opioids. Do not send refill requests.   Orders:  Orders Placed This Encounter  Procedures  . HIP INJECTION    Standing Status:   Future    Standing Expiration Date:   03/01/2021    Scheduling Instructions:     Purpose: Therapeutic     Indication: Hip pain 2ry to Trochanteric Burlitis bilateral (M70.61, M70.62).     Side: Bilateral     Sedation: With Sedation.     Timeframe: As soon as the schedule permits.  Raelene Bott Select 13 (MW), Urine    Volume: 30 ml(s). Minimum 3 ml of urine is needed. Document temperature of fresh sample. Indications: Long term (current) use of opiate analgesic (N40.768)    Order Specific Question:   Release to patient    Answer:   Immediate   Follow-up plan:   Return for Procedure (w/ sedation): (B) trochanteric bursa injection.      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  Allergic to NSAIDs.  Abnormal UDS (01/02/2016). (+) Unreported tramadol.   Planned  Pending:   Pending further  evaluation   Under consideration:   Possible bilateral hip jointRFA Diagnostic caudal ESI + epidurogram  Possible Racz procedure.  Possible bilateral cervical facetRFA Diagnostic right CESI  Diagnostic left sacroiliac joint block Possible bilateral sacroiliac joint RFA   Completed:   Palliative bilateral trochanteric bursa x1  Palliative bilateralsuprascapular nerve block x3(08/08/2020) Palliative bilateral suprascapular nerveRFA x1(05/13/2019) Diagnostic right IA hip joint injection x3 (06/01/2020)  Diagnostic left IA hip joint injection x2 (04/30/2018)  Diagnostic bilateral lumbar facet block x3 Palliative right lumbar facet RFA x2(12/16/2017)  Palliative left lumbar facet RFA x2(02/12/2018)  Diagnostic bilateral cervical facet block x1 Diagnostic right SI joint block x1   Therapeutic  Palliative (PRN) options:   Palliative bilateral trochanteric bursa #2  Palliative bilateralsuprascapular nerve block #4 Palliative bilateral suprascapular nerveRFA #2 Diagnostic right IA hip joint injection #4 Diagnostic left IA hip joint injection #3 Diagnostic bilateral lumbar facet block #4 Palliative right lumbar facet RFA #3 Palliative left lumbar facet RFA #3 Diagnostic bilateral cervical facet block #2 Diagnostic right SI joint block #2      Recent Visits Date Type Provider Dept  10/09/20 Office Visit Milinda Pointer, [facility]  09/11/20 Office Visit Milinda Pointer, MD [facility]  Showing recent visits within past 90 days and meeting all other requirements Today's Visits Date Type Provider Dept  11/29/20 Office Visit Milinda Pointer, MD [facility]  Showing today's visits and meeting all other requirements Future Appointments No visits were found meeting these conditions. Showing future appointments within next 90 days and meeting all other requirements  I discussed the assessment and treatment plan with the  patient. The patient was provided an opportunity to ask questions and all were answered. The patient agreed with the plan and demonstrated an understanding of the instructions.  Patient advised to call back or seek an in-person evaluation if the symptoms or condition worsens.  Duration of encounter: 30 minutes.  Note by: Gaspar Cola, MD Date: 11/29/2020; Time: 1:10 PM

## 2020-11-29 ENCOUNTER — Other Ambulatory Visit: Payer: Self-pay

## 2020-11-29 ENCOUNTER — Encounter: Payer: Self-pay | Admitting: Pain Medicine

## 2020-11-29 ENCOUNTER — Ambulatory Visit: Payer: Medicaid Other | Attending: Pain Medicine | Admitting: Pain Medicine

## 2020-11-29 VITALS — BP 144/99 | Temp 97.2°F | Resp 18 | Ht 65.0 in | Wt 240.0 lb

## 2020-11-29 DIAGNOSIS — M5412 Radiculopathy, cervical region: Secondary | ICD-10-CM | POA: Insufficient documentation

## 2020-11-29 DIAGNOSIS — M7061 Trochanteric bursitis, right hip: Secondary | ICD-10-CM | POA: Insufficient documentation

## 2020-11-29 DIAGNOSIS — M25551 Pain in right hip: Secondary | ICD-10-CM | POA: Insufficient documentation

## 2020-11-29 DIAGNOSIS — M25512 Pain in left shoulder: Secondary | ICD-10-CM | POA: Diagnosis present

## 2020-11-29 DIAGNOSIS — G9520 Unspecified cord compression: Secondary | ICD-10-CM

## 2020-11-29 DIAGNOSIS — G8929 Other chronic pain: Secondary | ICD-10-CM | POA: Diagnosis present

## 2020-11-29 DIAGNOSIS — M4802 Spinal stenosis, cervical region: Secondary | ICD-10-CM

## 2020-11-29 DIAGNOSIS — Z79891 Long term (current) use of opiate analgesic: Secondary | ICD-10-CM | POA: Diagnosis present

## 2020-11-29 DIAGNOSIS — S73192S Other sprain of left hip, sequela: Secondary | ICD-10-CM | POA: Insufficient documentation

## 2020-11-29 DIAGNOSIS — M7062 Trochanteric bursitis, left hip: Secondary | ICD-10-CM | POA: Diagnosis present

## 2020-11-29 DIAGNOSIS — G894 Chronic pain syndrome: Secondary | ICD-10-CM | POA: Diagnosis present

## 2020-11-29 DIAGNOSIS — M25552 Pain in left hip: Secondary | ICD-10-CM | POA: Diagnosis present

## 2020-11-29 DIAGNOSIS — S73191S Other sprain of right hip, sequela: Secondary | ICD-10-CM | POA: Diagnosis present

## 2020-11-29 DIAGNOSIS — Z79899 Other long term (current) drug therapy: Secondary | ICD-10-CM | POA: Insufficient documentation

## 2020-11-29 DIAGNOSIS — M5441 Lumbago with sciatica, right side: Secondary | ICD-10-CM | POA: Insufficient documentation

## 2020-11-29 DIAGNOSIS — R935 Abnormal findings on diagnostic imaging of other abdominal regions, including retroperitoneum: Secondary | ICD-10-CM | POA: Insufficient documentation

## 2020-11-29 DIAGNOSIS — M25511 Pain in right shoulder: Secondary | ICD-10-CM | POA: Diagnosis present

## 2020-11-29 DIAGNOSIS — F1120 Opioid dependence, uncomplicated: Secondary | ICD-10-CM | POA: Insufficient documentation

## 2020-11-29 MED ORDER — OXYCODONE HCL 10 MG PO TABS: 10.0000 mg | ORAL_TABLET | Freq: Four times a day (QID) | ORAL | 0 refills | Status: DC | PRN

## 2020-11-29 NOTE — Patient Instructions (Addendum)
____________________________________________________________________________________________  Drug Holidays (Slow)  What is a "Drug Holiday"? Drug Holiday: is the name given to the period of time during which a patient stops taking a medication(s) for the purpose of eliminating tolerance to the drug.  Benefits . Improved effectiveness of opioids. . Decreased opioid dose needed to achieve benefits. . Improved pain with lesser dose.  What is tolerance? Tolerance: is the progressive decreased in effectiveness of a drug due to its repetitive use. With repetitive use, the body gets use to the medication and as a consequence, it loses its effectiveness. This is a common problem seen with opioid pain medications. As a result, a larger dose of the drug is needed to achieve the same effect that used to be obtained with a smaller dose.  How long should a "Drug Holiday" last? You should stay off of the pain medicine for at least 14 consecutive days. (2 weeks)  Should I stop the medicine "cold [place]"? No. You should always coordinate with your Pain Specialist so that he/she can provide you with the correct medication dose to make the transition as smoothly as possible.  How do I stop the medicine? Slowly. You will be instructed to decrease the daily amount of pills that you take by one (1) pill every seven (7) days. This is called a "slow downward taper" of your dose. For example: if you normally take four (4) pills per day, you will be asked to drop this dose to three (3) pills per day for seven (7) days, then to two (2) pills per day for seven (7) days, then to one (1) per day for seven (7) days, and at the end of those last seven (7) days, this is when the "Drug Holiday" would start.   Will I have withdrawals? By doing a "slow downward taper" like this one, it is unlikely that you will experience any significant withdrawal symptoms. Typically, what triggers withdrawals is the sudden stop of a high  dose opioid therapy. Withdrawals can usually be avoided by slowly decreasing the dose over a prolonged period of time. If you do not follow these instructions and decide to stop your medication abruptly, withdrawals may be possible.  What are withdrawals? Withdrawals: refers to the wide range of symptoms that occur after stopping or dramatically reducing opiate drugs after heavy and prolonged use. Withdrawal symptoms do not occur to patients that use low dose opioids, or those who take the medication sporadically. Contrary to benzodiazepine (example: Valium, Xanax, etc.) or alcohol withdrawals ("Delirium Tremens"), opioid withdrawals are not lethal. Withdrawals are the physical manifestation of the body getting rid of the excess receptors.  Expected Symptoms Early symptoms of withdrawal may include: . Agitation . Anxiety . Muscle aches . Increased tearing . Insomnia . Runny nose . Sweating . Yawning  Late symptoms of withdrawal may include: . Abdominal cramping . Diarrhea . Dilated pupils . Goose bumps . Nausea . Vomiting  Will I experience withdrawals? Due to the slow nature of the taper, it is very unlikely that you will experience any.  What is a slow taper? Taper: refers to the gradual decrease in dose.  (Last update: 02/02/2020) ____________________________________________________________________________________________    ____________________________________________________________________________________________  Medication Recommendations and Reminders  Applies to: All patients receiving prescriptions (written and/or electronic).  Medication Rules & Regulations: These rules and regulations exist for your safety and that of others. They are not flexible and neither are we. Dismissing or ignoring them will be considered "non-compliance" with medication therapy, resulting in complete  and irreversible termination of such therapy. (See document titled "Medication Rules" for  more details.) In all conscience, because of safety reasons, we cannot continue providing a therapy where the patient does not follow instructions.  Pharmacy of record:   Definition: This is the pharmacy where your electronic prescriptions will be sent.   We do not endorse any particular pharmacy, however, we have experienced problems with [facility] not securing enough medication supply for the community.  We do not restrict you in your choice of pharmacy. However, once we write for your prescriptions, we will NOT be re-sending more prescriptions to fix restricted supply problems created by your pharmacy, or your insurance.   The pharmacy listed in the electronic medical record should be the one where you want electronic prescriptions to be sent.  If you choose to change pharmacy, simply notify our nursing staff.  Recommendations:  Keep all of your pain medications in a safe place, under lock and key, even if you live alone. We will NOT replace lost, stolen, or damaged medication.  After you fill your prescription, take 1 week's worth of pills and put them away in a safe place. You should keep a separate, properly labeled bottle for this purpose. The remainder should be kept in the original bottle. Use this as your primary supply, until it runs out. Once it's gone, then you know that you have 1 week's worth of medicine, and it is time to come in for a prescription refill. If you do this correctly, it is unlikely that you will ever run out of medicine.  To make sure that the above recommendation works, it is very important that you make sure your medication refill appointments are scheduled at least 1 week before you run out of medicine. To do this in an effective manner, make sure that you do not leave the office without scheduling your next medication management appointment. Always ask the nursing staff to show you in your prescription , when your medication will be running out. Then arrange for  the receptionist to get you a return appointment, at least 7 days before you run out of medicine. Do not wait until you have 1 or 2 pills left, to come in. This is very poor planning and does not take into consideration that we may need to cancel appointments due to bad weather, sickness, or emergencies affecting our staff.  DO NOT ACCEPT A "Partial Fill": If for any reason your pharmacy does not have enough pills/tablets to completely fill or refill your prescription, do not allow for a "partial fill". The law allows the pharmacy to complete that prescription within 72 hours, without requiring a new prescription. If they do not fill the rest of your prescription within those 72 hours, you will need a separate prescription to fill the remaining amount, which we will NOT provide. If the reason for the partial fill is your insurance, you will need to talk to the pharmacist about payment alternatives for the remaining tablets, but again, DO NOT ACCEPT A PARTIAL FILL, unless you can trust your pharmacist to obtain the remainder of the pills within 72 hours.  Prescription refills and/or changes in medication(s):   Prescription refills, and/or changes in dose or medication, will be conducted only during scheduled medication management appointments. (Applies to both, written and electronic prescriptions.)  No refills on procedure days. No medication will be changed or started on procedure days. No changes, adjustments, and/or refills will be conducted on a procedure day. Doing so   will interfere with the diagnostic portion of the procedure.  No phone refills. No medications will be "called into the pharmacy".  No Fax refills.  No weekend refills.  No Holliday refills.  No after hours refills.  Remember:  Business hours are:  Monday to Thursday 8:00 AM to 4:00 PM Provider's Schedule: Farron Watrous, MD - Appointments are:  Medication management: Monday and Wednesday 8:00 AM to 4:00 PM Procedure  day: Tuesday and Thursday 7:30 AM to 4:00 PM Bilal Lateef, MD - Appointments are:  Medication management: Tuesday and Thursday 8:00 AM to 4:00 PM Procedure day: Monday and Wednesday 7:30 AM to 4:00 PM (Last update: 02/02/2020) ____________________________________________________________________________________________   ____________________________________________________________________________________________  CBD (cannabidiol) WARNING  Applicable to: All individuals currently taking or considering taking CBD (cannabidiol) and, more important, all patients taking opioid analgesic controlled substances (pain medication). (Example: oxycodone; oxymorphone; hydrocodone; hydromorphone; morphine; methadone; tramadol; tapentadol; fentanyl; buprenorphine; butorphanol; dextromethorphan; meperidine; codeine; etc.)  Legal status: CBD remains a Schedule I drug prohibited for any use. CBD is illegal with one exception. In the [place], CBD has a limited Food and Drug Administration (FDA) approval for the treatment of two specific types of epilepsy disorders. Only one CBD product has been approved by the FDA for this purpose: "Epidiolex". FDA is aware that some companies are marketing products containing cannabis and cannabis-derived compounds in ways that violate the Federal Food, Drug and Cosmetic Act (FD&C Act) and that may put the health and safety of consumers at risk. The FDA, a Federal agency, has not enforced the CBD status since 2018.   Legality: Some manufacturers ship CBD products nationally, which is illegal. Often such products are sold online and are therefore available throughout the country. CBD is openly sold in head shops and health food stores in some states where such sales have not been explicitly legalized. Selling unapproved products with unsubstantiated therapeutic claims is not only a violation of the law, but also can put patients at risk, as these products have not been proven to  be safe or effective. Federal illegality makes it difficult to conduct research on CBD.  Reference: "FDA Regulation of Cannabis and Cannabis-Derived Products, Including Cannabidiol (CBD)" - https://www.fda.gov/news-events/public-health-focus/fda-regulation-cannabis-and-cannabis-derived-products-including-cannabidiol-cbd  Warning: CBD is not FDA approved and has not undergo the same manufacturing controls as prescription drugs.  This means that the purity and safety of available CBD may be questionable. Most of the time, despite manufacturer's claims, it is contaminated with THC (delta-9-tetrahydrocannabinol - the chemical in marijuana responsible for the "HIGH").  When this is the case, the THC contaminant will trigger a positive urine drug screen (UDS) test for Marijuana (carboxy-THC). Because a positive UDS for any illicit substance is a violation of our medication agreement, your opioid analgesics (pain medicine) may be permanently discontinued.  MORE ABOUT CBD  General Information: CBD  is a derivative of the Marijuana (cannabis sativa) plant discovered in 1940. It is one of the 113 identified substances found in Marijuana. It accounts for up to 40% of the plant's extract. As of 2018, preliminary clinical studies on CBD included research for the treatment of anxiety, movement disorders, and pain. CBD is available and consumed in multiple forms, including inhalation of smoke or vapor, as an aerosol spray, and by mouth. It may be supplied as an oil containing CBD, capsules, dried cannabis, or as a liquid solution. CBD is thought not to be as psychoactive as THC (delta-9-tetrahydrocannabinol - the chemical in marijuana responsible for the "HIGH"). Studies suggest that CBD may interact   with different biological target receptors in the body, including cannabinoid and other neurotransmitter receptors. As of 2018 the mechanism of action for its biological effects has not been determined.  Side-effects   Adverse reactions: Dry mouth, diarrhea, decreased appetite, fatigue, drowsiness, malaise, weakness, sleep disturbances, and others.  Drug interactions: CBC may interact with other medications such as blood-thinners. (Last update: 02/19/2020) ____________________________________________________________________________________________   ____________________________________________________________________________________________  Medication Rules  Purpose: To inform patients, and their family members, of our rules and regulations.  Applies to: All patients receiving prescriptions (written or electronic).  Pharmacy of record: Pharmacy where electronic prescriptions will be sent. If written prescriptions are taken to a different pharmacy, please inform the nursing staff. The pharmacy listed in the electronic medical record should be the one where you would like electronic prescriptions to be sent.  Electronic prescriptions: In compliance with the [place] Opioid Misuse Prevention (STOP) Act of 2017 (Session Law 2017-74/H243), effective July 15, 2018, all controlled substances must be electronically prescribed. Calling prescriptions to the pharmacy will cease to exist.  Prescription refills: Only during scheduled appointments. Applies to all prescriptions.  NOTE: The following applies primarily to controlled substances (Opioid* Pain Medications).   Type of encounter (visit): For patients receiving controlled substances, face-to-face visits are required. (Not an option or up to the patient.)  Patient's responsibilities: 1. Pain Pills: Bring all pain pills to every appointment (except for procedure appointments). 2. Pill Bottles: Bring pills in original pharmacy bottle. Always bring the newest bottle. Bring bottle, even if empty. 3. Medication refills: You are responsible for knowing and keeping track of what medications you take and those you need refilled. The day before  your appointment: write a list of all prescriptions that need to be refilled. The day of the appointment: give the list to the admitting nurse. Prescriptions will be written only during appointments. No prescriptions will be written on procedure days. If you forget a medication: it will not be "Called in", "Faxed", or "electronically sent". You will need to get another appointment to get these prescribed. No early refills. Do not call asking to have your prescription filled early. 4. Prescription Accuracy: You are responsible for carefully inspecting your prescriptions before leaving our office. Have the discharge nurse carefully go over each prescription with you, before taking them home. Make sure that your name is accurately spelled, that your address is correct. Check the name and dose of your medication to make sure it is accurate. Check the number of pills, and the written instructions to make sure they are clear and accurate. Make sure that you are given enough medication to last until your next medication refill appointment. 5. Taking Medication: Take medication as prescribed. When it comes to controlled substances, taking less pills or less frequently than prescribed is permitted and encouraged. Never take more pills than instructed. Never take medication more frequently than prescribed.  6. Inform other Doctors: Always inform, all of your healthcare providers, of all the medications you take. 7. Pain Medication from other Providers: You are not allowed to accept any additional pain medication from any other Doctor or Healthcare provider. There are two exceptions to this rule. (see below) In the event that you require additional pain medication, you are responsible for notifying us, as stated below. 8. Cough Medicine: Often these contain an opioid, such as codeine or hydrocodone. Never accept or take cough medicine containing these opioids if you are already taking an opioid* medication. The  combination may cause respiratory failure and   death. 9. Medication Agreement: You are responsible for carefully reading and following our Medication Agreement. This must be signed before receiving any prescriptions from our practice. Safely store a copy of your signed Agreement. Violations to the Agreement will result in no further prescriptions. (Additional copies of our Medication Agreement are available upon request.) 10. Laws, Rules, & Regulations: All patients are expected to follow all Federal and [employer], [employer], Rules, [employer]. Ignorance of the Laws does not constitute a valid excuse.  11. Illegal drugs and Controlled Substances: The use of illegal substances (including, but not limited to marijuana and its derivatives) and/or the illegal use of any controlled substances is strictly prohibited. Violation of this rule may result in the immediate and permanent discontinuation of any and all prescriptions being written by our practice. The use of any illegal substances is prohibited. 12. Adopted CDC guidelines & recommendations: Target dosing levels will be at or below 60 MME/day. Use of benzodiazepines** is not recommended.  Exceptions: There are only two exceptions to the rule of not receiving pain medications from other Healthcare Providers. 1. Exception #1 (Emergencies): In the event of an emergency (i.e.: accident requiring emergency care), you are allowed to receive additional pain medication. However, you are responsible for: As soon as you are able, call our office [phone], at any time of the day or night, and leave a message stating your name, the date and nature of the emergency, and the name and dose of the medication prescribed. In the event that your call is answered by a member of our staff, make sure to document and save the date, time, and the name of the person that took your information.  2. Exception #2 (Planned Surgery): In the event that you are scheduled by  another doctor or dentist to have any type of surgery or procedure, you are allowed (for a period no longer than 30 days), to receive additional pain medication, for the acute post-op pain. However, in this case, you are responsible for picking up a copy of our "Post-op Pain Management for Surgeons" handout, and giving it to your surgeon or dentist. This document is available at our office, and does not require an appointment to obtain it. Simply go to our office during business hours (Monday-Thursday from 8:00 AM to 4:00 PM) (Friday 8:00 AM to 12:00 Noon) or if you have a scheduled appointment with [place], prior to your surgery, and ask for it by name. In addition, you are responsible for: calling our office [phone], at any time of the day or night, and leaving a message stating your name, name of your surgeon, type of surgery, and date of procedure or surgery. Failure to comply with your responsibilities may result in termination of therapy involving the controlled substances.  *Opioid medications include: morphine, codeine, oxycodone, oxymorphone, hydrocodone, hydromorphone, meperidine, tramadol, tapentadol, buprenorphine, fentanyl, methadone. **Benzodiazepine medications include: diazepam (Valium), alprazolam (Xanax), clonazepam (Klonopine), lorazepam (Ativan), clorazepate (Tranxene), chlordiazepoxide (Librium), estazolam (Prosom), oxazepam (Serax), temazepam (Restoril), triazolam (Halcion) (Last updated: 06/12/2020) ____________________________________________________________________________________________   ____________________________________________________________________________________________  Preparing for Procedure with Sedation  Procedure appointments are limited to planned procedures: . No Prescription Refills. . No disability issues will be discussed. . No medication changes will be discussed.  Instructions: . Oral Intake: Do not eat or drink anything for at least 8 hours prior  to your procedure. (Exception: Blood Pressure Medication. See below.) . Transportation: Unless otherwise stated by your physician, you may drive yourself after the procedure. . Blood Pressure  Medicine: Do not forget to take your blood pressure medicine with a sip of water the morning of the procedure. If your Diastolic (lower reading)is above 100 mmHg, elective cases will be cancelled/rescheduled. . Blood thinners: These will need to be stopped for procedures. Notify our staff if you are taking any blood thinners. Depending on which one you take, there will be specific instructions on how and when to stop it. . Diabetics on insulin: Notify the staff so that you can be scheduled 1st case in the morning. If your diabetes requires high dose insulin, take only  of your normal insulin dose the morning of the procedure and notify the staff that you have done so. . Preventing infections: Shower with an antibacterial soap the morning of your procedure. . Build-up your immune system: Take 1000 mg of Vitamin C with every meal (3 times a day) the day prior to your procedure. Marland Kitchen Antibiotics: Inform the staff if you have a condition or reason that requires you to take antibiotics before dental procedures. . Pregnancy: If you are pregnant, call and cancel the procedure. . Sickness: If you have a cold, fever, or any active infections, call and cancel the procedure. . Arrival: You must be in the facility at least 30 minutes prior to your scheduled procedure. . Children: Do not bring children with you. . Dress appropriately: Bring dark clothing that you would not mind if they get stained. . Valuables: Do not bring any jewelry or valuables.  Reasons to call and reschedule or cancel your procedure: (Following these recommendations will minimize the risk of a serious complication.) . Surgeries: Avoid having procedures within 2 weeks of any surgery. (Avoid for 2 weeks before or after any surgery). . Flu Shots: Avoid  having procedures within 2 weeks of a flu shots or . (Avoid for 2 weeks before or after immunizations). . Barium: Avoid having a procedure within 7-10 days after having had a radiological study involving the use of radiological contrast. (Myelograms, Barium swallow or enema study). . Heart attacks: Avoid any elective procedures or surgeries for the initial 6 months after a "Myocardial Infarction" (Heart Attack). . Blood thinners: It is imperative that you stop these medications before procedures. Let us know if you if you take any blood thinner.  . Infection: Avoid procedures during or within two weeks of an infection (including chest colds or gastrointestinal problems). Symptoms associated with infections include: Localized redness, fever, chills, night sweats or profuse sweating, burning sensation when voiding, cough, congestion, stuffiness, runny nose, sore throat, diarrhea, nausea, vomiting, cold or Flu symptoms, recent or current infections. It is specially important if the infection is over the area that we intend to treat. Marland Kitchen Heart and lung problems: Symptoms that may suggest an active cardiopulmonary problem include: cough, chest pain, breathing difficulties or shortness of breath, dizziness, ankle swelling, uncontrolled high or unusually low blood pressure, and/or palpitations. If you are experiencing any of these symptoms, cancel your procedure and contact your primary care physician for an evaluation.  Remember:  Regular Business hours are:  Monday to Thursday 8:00 AM to 4:00 PM  Provider's Schedule: Milinda Pointer, MD:  Procedure days: Tuesday and Thursday 7:30 AM to 4:00 PM  Gillis Santa, MD:  Procedure days: Monday and Wednesday 7:30 AM to 4:00 PM ____________________________________________________________________________________________   ____________________________________________________________________________________________  General Risks and Possible  Complications  Patient Responsibilities: It is important that you read this as it is part of your informed consent. It is our duty to inform you  of the risks and possible complications associated with treatments offered to you. It is your responsibility as a patient to read this and to ask questions about anything that is not clear or that you believe was not covered in this document.  Patient's Rights: You have the right to refuse treatment. You also have the right to change your mind, even after initially having agreed to have the treatment done. However, under this last option, if you wait until the last second to change your mind, you may be charged for the materials used up to that point.  Introduction: Medicine is not an Chief Strategy Officer. Everything in Medicine, including the lack of treatment(s), carries the potential for danger, harm, or loss (which is by definition: Risk). In Medicine, a complication is a secondary problem, condition, or disease that can aggravate an already existing one. All treatments carry the risk of possible complications. The fact that a side effects or complications occurs, does not imply that the treatment was conducted incorrectly. It must be clearly understood that these can happen even when everything is done following the highest safety standards.  No treatment: You can choose not to proceed with the proposed treatment alternative. The "PRO(s)" would include: avoiding the risk of complications associated with the therapy. The "CON(s)" would include: not getting any of the treatment benefits. These benefits fall under one of three categories: diagnostic; therapeutic; and/or palliative. Diagnostic benefits include: getting information which can ultimately lead to improvement of the disease or symptom(s). Therapeutic benefits are those associated with the successful treatment of the disease. Finally, palliative benefits are those related to the decrease of the primary  symptoms, without necessarily curing the condition (example: decreasing the pain from a flare-up of a chronic condition, such as incurable terminal cancer).  General Risks and Complications: These are associated to most interventional treatments. They can occur alone, or in combination. They fall under one of the following six (6) categories: no benefit or worsening of symptoms; bleeding; infection; nerve damage; allergic reactions; and/or death. 1. No benefits or worsening of symptoms: In Medicine there are no guarantees, only probabilities. No healthcare provider can ever guarantee that a medical treatment will work, they can only state the probability that it may. Furthermore, there is always the possibility that the condition may worsen, either directly, or indirectly, as a consequence of the treatment. 2. Bleeding: This is more common if the patient is taking a blood thinner, either prescription or over the counter (example: Goody Powders, Fish oil, Aspirin, Garlic, etc.), or if suffering a condition associated with impaired coagulation (example: Hemophilia, cirrhosis of the liver, low platelet counts, etc.). However, even if you do not have one on these, it can still happen. If you have any of these conditions, or take one of these drugs, make sure to notify your treating physician. 3. Infection: This is more common in patients with a compromised immune system, either due to disease (example: diabetes, cancer, human immunodeficiency virus [HIV], etc.), or due to medications or treatments (example: therapies used to treat cancer and rheumatological diseases). However, even if you do not have one on these, it can still happen. If you have any of these conditions, or take one of these drugs, make sure to notify your treating physician. 4. Nerve Damage: This is more common when the treatment is an invasive one, but it can also happen with the use of medications, such as those used in the treatment of cancer.  The damage can occur to small secondary  nerves, or to large primary ones, such as those in the spinal cord and brain. This damage may be temporary or permanent and it may lead to impairments that can range from temporary numbness to permanent paralysis and/or brain death. 5. Allergic Reactions: Any time a substance or material comes in contact with our body, there is the possibility of an allergic reaction. These can range from a mild skin rash (contact dermatitis) to a severe systemic reaction (anaphylactic reaction), which can result in death. 6. Death: In general, any medical intervention can result in death, most of the time due to an unforeseen complication. ____________________________________________________________________________________________

## 2020-11-29 NOTE — Progress Notes (Signed)
Nursing Pain Medication Assessment:  Safety precautions to be maintained throughout the outpatient stay will include: orient to surroundings, keep bed in low position, maintain call bell within reach at all times, provide assistance with transfer out of bed and ambulation.  Medication Inspection Compliance: Pill count conducted under aseptic conditions, in front of the patient. Neither the pills nor the bottle was removed from the patient's sight at any time. Once count was completed pills were immediately returned to the patient in their original bottle.  Medication: Oxycodone IR Pill/Patch Count: 72 of 120 pills remain Pill/Patch Appearance: Markings consistent with prescribed medication Bottle Appearance: Standard pharmacy container. Clearly labeled. Filled Date: 05 / 02 / 2022 Last Medication intake:  Today

## 2020-12-05 LAB — TOXASSURE SELECT 13 (MW), URINE

## 2020-12-06 ENCOUNTER — Encounter: Payer: Medicaid Other | Admitting: Pain Medicine

## 2020-12-07 ENCOUNTER — Other Ambulatory Visit: Payer: Self-pay | Admitting: Pulmonary Disease

## 2020-12-12 ENCOUNTER — Telehealth: Payer: Self-pay | Admitting: Pain Medicine

## 2020-12-12 NOTE — Telephone Encounter (Signed)
Patient needs PA on Medications.

## 2021-01-24 NOTE — Progress Notes (Signed)
PROVIDER NOTE: Information contained herein reflects review and annotations entered in association with encounter. Interpretation of such information and data should be left to medically-trained personnel. Information provided to patient can be located elsewhere in the medical record under "Patient Instructions". Document created using STT-dictation technology, any transcriptional errors that may result from process are unintentional.    Patient: Danielle [name]  Service Category: Procedure  Provider: Gaspar Cola, MD  DOB: 1960-11-10  DOS: 01/25/2021  Location: [facility]  MRN: [id number]  Setting: Ambulatory - outpatient  Referring Provider: Danae Orleans, MD  Type: Established Patient  Specialty: Interventional Pain Management  PCP: Danae Orleans, MD   Primary Reason for Visit: Interventional Pain Management Treatment. CC: Hip Pain (bilateral)  Procedure:          Anesthesia, Analgesia, Anxiolysis:  Type: Hip bursa injection          Primary Purpose: Palliative Region: Upper (proximal) Femoral Region Level: Hip Joint Target Area: Trochanteric Bursa Approach: Posterolateral approach Laterality: Bilateral  Type: Minimal (Conscious) Anxiolysis combined with Local Anesthesia Indication(s): Analgesia         Local Anesthetic: Lidocaine 1-2% Route:  PO IV Access: Secured Sedation: Meaningful verbal contact was maintained at all times during the procedure   Position: Prone   Indications: 1. Chronic hip pain (2ry area of Pain) (Bilateral) (R>L)   2. Chronic trochanteric bursitis (Bilateral)   3. Greater trochanteric bursitis of hips (Bilateral)   4. 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)   5. Encounter for therapeutic procedure   6. Anxiety due to invasive procedure    Pain Score: Pre-procedure: 3 /10 Post-procedure: 3  (3 left, 1 right)/10   Today the patient reports some worsening of her right hip pain where she is now experiencing some pain in the  groin area especially with rotational movement of her right hip.  Typically we would have proceeded with a right intra-articular hip joint injection however, the patient has [place] healthcare managed Medicaid and preapproval will be required.  Therefore, we will have to limit our treatment to be approved bilateral trochanteric bursa injections.  Pre-op H&P Assessment:  Danielle [name] is a 60 y.o. (year old), female patient, seen today for interventional treatment. She  has a past surgical history that includes necksurgery (10/21/2014); Back surgery (2013); Foot surgery (Right); Elbow surgery (Left); Carpal tunnel release (Right); Nose surgery; Partial hysterectomy; Total shoulder replacement (Bilateral); and Colonoscopy with propofol (N/A, 02/05/2017). Danielle [name] has a current medication list which includes the following prescription(s): albuterol, cetirizine, cholecalciferol, citalopram, docusate sodium, levothyroxine, magnesium hydroxide, naloxone, nystatin, olopatadine hcl, oxycodone hcl, [START ON 02/10/2021] oxycodone hcl, diclofenac sodium, oxycodone hcl, oxycodone hcl, tizanidine, and trelegy ellipta. Her primarily concern today is the Hip Pain (bilateral)  Initial Vital Signs:  Pulse/HCG Rate: (!) 101  Temp: (!) 97.1 F (36.2 C) Resp: 16 BP: 132/79 SpO2: 97 %  BMI: Estimated body mass index is 39.27 kg/m as calculated from the following:   Height as of this encounter: 5\' 5"  (1.651 m).   Weight as of this encounter: 236 lb (107 kg).  Risk Assessment: Allergies: Reviewed. She is allergic to aripiprazole, duloxetine, gabapentin, gold, iodinated diagnostic agents, naproxen sodium, nortriptyline hcl, nsaids, pregabalin, meperidine, ace inhibitors, aspirin, cefpodoxime, fluoxetine, fluticasone-salmeterol, levofloxacin, lithium, paroxetine, tape, telithromycin, theophylline, topiramate, trazodone, triamcinolone, venlafaxine, bupropion, ketorolac tromethamine, morphine, and moxifloxacin.  Allergy  Precautions: None required Coagulopathies: Reviewed. None identified.  Blood-thinner therapy: None at this time Active Infection(s): Reviewed. None identified. Danielle [name] is  afebrile  Site Confirmation: Danielle [name] was asked to confirm the procedure and laterality before marking the site Procedure checklist: Completed Consent: Before the procedure and under the influence of no sedative(s), amnesic(s), or anxiolytics, the patient was informed of the treatment options, risks and possible complications. To fulfill our ethical and legal obligations, as recommended by the American Medical Association's Code of Ethics, I have informed the patient of my clinical impression; the nature and purpose of the treatment or procedure; the risks, benefits, and possible complications of the intervention; the alternatives, including doing nothing; the risk(s) and benefit(s) of the alternative treatment(s) or procedure(s); and the risk(s) and benefit(s) of doing nothing. The patient was provided information about the general risks and possible complications associated with the procedure. These may include, but are not limited to: failure to achieve desired goals, infection, bleeding, organ or nerve damage, allergic reactions, paralysis, and death. In addition, the patient was informed of those risks and complications associated to the procedure, such as failure to decrease pain; infection; bleeding; organ or nerve damage with subsequent damage to sensory, motor, and/or autonomic systems, resulting in permanent pain, numbness, and/or weakness of one or several areas of the body; allergic reactions; (i.e.: anaphylactic reaction); and/or death. Furthermore, the patient was informed of those risks and complications associated with the medications. These include, but are not limited to: allergic reactions (i.e.: anaphylactic or anaphylactoid reaction(s)); adrenal axis suppression; blood sugar elevation that in diabetics may  result in ketoacidosis or comma; water retention that in patients with history of congestive heart failure may result in shortness of breath, pulmonary edema, and decompensation with resultant heart failure; weight gain; swelling or edema; medication-induced neural toxicity; particulate matter embolism and blood vessel occlusion with resultant organ, and/or nervous system infarction; and/or aseptic necrosis of one or more joints. Finally, the patient was informed that Medicine is not an exact science; therefore, there is also the possibility of unforeseen or unpredictable risks and/or possible complications that may result in a catastrophic outcome. The patient indicated having understood very clearly. We have given the patient no guarantees and we have made no promises. Enough time was given to the patient to ask questions, all of which were answered to the patient's satisfaction. Ms. Booton has indicated that she wanted to continue with the procedure. Attestation: I, the ordering provider, attest that I have discussed with the patient the benefits, risks, side-effects, alternatives, likelihood of achieving goals, and potential problems during recovery for the procedure that I have provided informed consent. Date  Time: 01/25/2021 11:13 AM  Pre-Procedure Preparation:  Monitoring: As per clinic protocol. Respiration, ETCO2, SpO2, BP, heart rate and rhythm monitor placed and checked for adequate function Safety Precautions: Patient was assessed for positional comfort and pressure points before starting the procedure. Time-out: I initiated and conducted the "Time-out" before starting the procedure, as per protocol. The patient was asked to participate by confirming the accuracy of the "Time Out" information. Verification of the correct person, site, and procedure were performed and confirmed by me, the nursing staff, and the patient. "Time-out" conducted as per Joint Commission's Universal Protocol  (UP.01.01.01). Time: 1150  Description of Procedure:          Area Prepped: Entire Posterolateral hip area. DuraPrep (Iodine Povacrylex [0.7% available iodine] and Isopropyl Alcohol, 74% w/w) Safety Precautions: Aspiration looking for blood return was conducted prior to all injections. At no point did we inject any substances, as a needle was being advanced. No attempts were made at seeking  any paresthesias. Safe injection practices and needle disposal techniques used. Medications properly checked for expiration dates. SDV (single dose vial) medications used. Description of the Procedure: Protocol guidelines were followed. The patient was placed in position over the procedure table. The target area was identified and the area prepped in the usual manner. Skin & deeper tissues infiltrated with local anesthetic. Appropriate amount of time allowed to pass for local anesthetics to take effect. The procedure needles were then advanced to the target area. Proper needle placement secured. Negative aspiration confirmed. Solution injected in intermittent fashion, asking for systemic symptoms every 0.5cc of injectate. The needles were then removed and the area cleansed, making sure to leave some of the prepping solution back to take advantage of its long term bactericidal properties. Vitals:   01/25/21 1111 01/25/21 1145 01/25/21 1155 01/25/21 1204  BP: 132/79 120/76 113/87 120/76  Pulse: (!) 101 83 82 84  Resp: 16 16 13 16   Temp: (!) 97.1 F (36.2 C)     TempSrc: Temporal     SpO2: 97% 99% 100% 95%  Weight: 236 lb (107 kg)     Height: 5\' 5"  (1.651 m)       Start Time: 1150 hrs. End Time: 1155 hrs.        Materials:  Needle(s) Type: Spinal Needle Gauge: 22G Length: 5.0-in Medication(s): Please see orders for medications and dosing details.  Imaging Guidance (Non-Spinal):          Type of Imaging Technique: Fluoroscopy Guidance (Non-Spinal) Indication(s): Assistance in needle guidance and  placement for procedures requiring needle placement in or near specific anatomical locations not easily accessible without such assistance. Exposure Time: Please see nurses notes. Contrast: Before injecting any contrast, we confirmed that the patient did not have an allergy to iodine, shellfish, or radiological contrast. Once satisfactory needle placement was completed at the desired level, radiological contrast was injected. Contrast injected under live fluoroscopy. No contrast complications. See chart for type and volume of contrast used. Fluoroscopic Guidance: I was personally present during the use of fluoroscopy. "Tunnel Vision Technique" used to obtain the best possible view of the target area. Parallax error corrected before commencing the procedure. "Direction-depth-direction" technique used to introduce the needle under continuous pulsed fluoroscopy. Once target was reached, antero-posterior, oblique, and lateral fluoroscopic projection used confirm needle placement in all planes. Images permanently stored in EMR. Interpretation: I personally interpreted the imaging intraoperatively. Adequate needle placement confirmed in multiple planes. Appropriate spread of contrast into desired area was observed. No evidence of afferent or efferent intravascular uptake. Permanent images saved into the patient's record.  Antibiotic Prophylaxis:   Anti-infectives (From admission, onward)    None      Indication(s): None identified  Post-operative Assessment:  Post-procedure Vital Signs:  Pulse/HCG Rate: 84  Temp: (!) 97.1 F (36.2 C) Resp: 16 BP: 120/76 SpO2: 95 %  EBL: None  Complications: No immediate post-treatment complications observed by team, or reported by patient.  Note: The patient tolerated the entire procedure well. A repeat set of vitals were taken after the procedure and the patient was kept under observation following institutional policy, for this type of procedure.  Post-procedural neurological assessment was performed, showing return to baseline, prior to discharge. The patient was provided with post-procedure discharge instructions, including a section on how to identify potential problems. Should any problems arise concerning this procedure, the patient was given instructions to immediately contact us, at any time, without hesitation. In any case, we plan to contact the  patient by telephone for a follow-up status report regarding this interventional procedure.  Comments:  No additional relevant information.  Plan of Care  Orders:  Orders Placed This Encounter  Procedures   HIP INJECTION    Purpose: Palliative Indication: Hip pain 2ry to Trochanteric Burlitis bilateral (M70.61, M70.62).    Scheduling Instructions:     Procedure: Trochanteric bursa injection     Laterality: Bilateral     Sedation: Patient's choice.     Timeframe: Today   DG PA[facility] C-ARM 1-60 MIN NO REPORT    Intraoperative interpretation by procedural physician at [facility].    Standing Status:   Standing    Number of Occurrences:   1    Order Specific Question:   Reason for exam:    Answer:   Assistance in needle guidance and placement for procedures requiring needle placement in or near specific anatomical locations not easily accessible without such assistance.   Informed Consent Details: Physician/Practitioner Attestation; Transcribe to consent form and obtain patient signature    Note: Always confirm laterality of pain with Ms. Domingo Cocking, before procedure. Transcribe to consent form and obtain patient signature.    Order Specific Question:   Physician/Practitioner attestation of informed consent for procedure/surgical case    Answer:   I, the physician/practitioner, attest that I have discussed with the patient the benefits, risks, side effects, alternatives, likelihood of achieving goals and potential problems during recovery for the procedure that I have  provided informed consent.    Order Specific Question:   Procedure    Answer:   Hip bursa injection    Order Specific Question:   Physician/Practitioner performing the procedure    Answer:   Kaelen Caughlin A. [name], MD    Order Specific Question:   Indication/Reason    Answer:   Hip bursitis   Provide equipment / supplies at bedside    "Block Tray" (Disposable  single use) Needle type: SpinalSpinal Amount/quantity: 2 Size: Medium (5-inch) Gauge: 22G    Standing Status:   Standing    Number of Occurrences:   1    Order Specific Question:   Specify    Answer:   Block Tray   Saline lock IV    (Routine) Please secure IV access as safety precaution for rapid administration of medications in case of adverse intra-procedural reactions or events. Discontinue prior to discharge, but no sooner than 15 min after procedure.    Standing Status:   Standing    Number of Occurrences:   1    Chronic Opioid Analgesic:  Oxycodone IR 10 mg, 1 tab PO q 6 hrs (40 mg/day of oxycodone) MME/day: 60 mg/day.   Medications ordered for procedure: Meds ordered this encounter  Medications   lidocaine (XYLOCAINE) 2 % (with pres) injection 400 mg   diazepam (VALIUM) tablet 10 mg    Make sure Flumazenil is available in the pyxis when using this medication. If oversedation occurs, administer 0.2 mg IV over 15 sec. If after 45 sec no response, administer 0.2 mg again over 1 min; may repeat at 1 min intervals; not to exceed 4 doses (1 mg)   ropivacaine (PF) 2 mg/mL (0.2%) (NAROPIN) injection 9 mL   methylPREDNISolone acetate (DEPO-MEDROL) injection 80 mg   methylPREDNISolone acetate (DEPO-MEDROL) injection 80 mg   ropivacaine (PF) 2 mg/mL (0.2%) (NAROPIN) injection 9 mL    Medications administered: We administered lidocaine, diazepam, ropivacaine (PF) 2 mg/mL (0.2%), methylPREDNISolone acetate, methylPREDNISolone acetate, and ropivacaine (PF) 2 mg/mL (0.2%).  See the medical record for exact dosing, route, and  time of administration.  Follow-up plan:   Return for procedure day (afternoon VV) (PPE).      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  Allergic to NSAIDs.  Abnormal UDS (01/02/2016). (+) Unreported tramadol.   Planned  Pending:   Pending further evaluation   Under consideration:   Possible bilateral hip joint RFA  Diagnostic caudal ESI + epidurogram  Possible Racz procedure.  Possible bilateral cervical facet RFA   Diagnostic right CESI  Diagnostic left sacroiliac joint block  Possible bilateral sacroiliac joint RFA    Completed:   Palliative bilateral trochanteric bursa x1  Palliative bilateral suprascapular nerve block x3 (08/08/2020) Palliative bilateral suprascapular nerve RFA x1 (05/13/2019) Diagnostic right IA hip joint injection x3 (06/01/2020)  Diagnostic left IA hip joint injection x2 (04/30/2018)  Diagnostic bilateral lumbar facet block x3  Palliative right lumbar facet RFA x2 (12/16/2017)  Palliative left lumbar facet RFA x2 (02/12/2018)  Diagnostic bilateral cervical facet block x1  Diagnostic right SI joint block x1    Therapeutic  Palliative (PRN) options:   Palliative bilateral trochanteric bursa #2  Palliative bilateral suprascapular nerve block #4  Palliative bilateral suprascapular nerve RFA #2  Diagnostic right IA hip joint injection #4  Diagnostic left IA hip joint injection #3  Diagnostic bilateral lumbar facet block #4  Palliative right lumbar facet RFA #3  Palliative left lumbar facet RFA #3  Diagnostic bilateral cervical facet block #2  Diagnostic right SI joint block #2        Recent Visits Date Type Provider Dept  11/29/20 Office Visit Milinda Pointer, MD [facility]  Showing recent visits within past 90 days and meeting all other requirements Today's Visits Date Type Provider Dept  01/25/21 Procedure visit Milinda Pointer, MD [facility]  Showing today's visits and meeting all other requirements Future Appointments Date Type Provider Dept  02/07/21 Appointment Milinda Pointer, MD [facility]  03/12/21 Appointment Milinda Pointer, MD [facility]  Showing future appointments within next 90 days and meeting all other requirements Disposition: Discharge home  Discharge (Date  Time): 01/25/2021; 1205 hrs.   Primary Care Physician: Danae Orleans, MD Location: [facility] Note by: Gaspar Cola, MD Date: 01/25/2021; Time: 7:27 PM  Disclaimer:  Medicine is not an Chief Strategy Officer. The only guarantee in medicine is that nothing is guaranteed. It is important to note that the decision to proceed with this intervention was based on the information collected from the patient. The Data and conclusions were drawn from the patient's questionnaire, the interview, and the physical examination. Because the information was provided in large part by the patient, it cannot be guaranteed that it has not been purposely or unconsciously manipulated. Every effort has been made to obtain as much relevant data as possible for this evaluation. It is important to note that the conclusions that lead to this procedure are derived in large part from the available data. Always take into account that the treatment will also be dependent on availability of resources and existing treatment guidelines, considered by other Pain Management Practitioners as being common knowledge and practice, at the time of the intervention. For Medico-Legal purposes, it is also important to point out that variation in procedural techniques and pharmacological choices are the acceptable norm. The indications, contraindications, technique, and results of the above procedure should only be interpreted and judged  by a Board-Certified Interventional Pain Specialist with extensive familiarity and expertise in the same exact  procedure and technique.

## 2021-01-25 ENCOUNTER — Ambulatory Visit
Admission: RE | Admit: 2021-01-25 | Discharge: 2021-01-25 | Disposition: A | Discharge status: Home / Self Care | Payer: Medicaid Other | Source: Ambulatory Visit | Attending: Pain Medicine | Admitting: Pain Medicine

## 2021-01-25 ENCOUNTER — Encounter: Payer: Self-pay | Admitting: Pain Medicine

## 2021-01-25 ENCOUNTER — Other Ambulatory Visit: Payer: Self-pay

## 2021-01-25 ENCOUNTER — Ambulatory Visit (HOSPITAL_BASED_OUTPATIENT_CLINIC_OR_DEPARTMENT_OTHER): Payer: Medicaid Other | Admitting: Pain Medicine

## 2021-01-25 VITALS — BP 120/76 | HR 84 | Temp 97.1°F | Resp 16 | Ht 65.0 in | Wt 236.0 lb

## 2021-01-25 DIAGNOSIS — M25551 Pain in right hip: Secondary | ICD-10-CM

## 2021-01-25 DIAGNOSIS — Z5189 Encounter for other specified aftercare: Secondary | ICD-10-CM

## 2021-01-25 DIAGNOSIS — M7062 Trochanteric bursitis, left hip: Secondary | ICD-10-CM | POA: Diagnosis present

## 2021-01-25 DIAGNOSIS — F419 Anxiety disorder, unspecified: Secondary | ICD-10-CM

## 2021-01-25 DIAGNOSIS — G8929 Other chronic pain: Secondary | ICD-10-CM

## 2021-01-25 DIAGNOSIS — M160 Bilateral primary osteoarthritis of hip: Secondary | ICD-10-CM | POA: Insufficient documentation

## 2021-01-25 DIAGNOSIS — M7061 Trochanteric bursitis, right hip: Secondary | ICD-10-CM | POA: Insufficient documentation

## 2021-01-25 DIAGNOSIS — M25552 Pain in left hip: Secondary | ICD-10-CM

## 2021-01-25 MED ORDER — ROPIVACAINE HCL 2 MG/ML IJ SOLN
9.0000 mL | Freq: Once | INTRAMUSCULAR | Status: AC
  Administered 2021-01-25: 9 mL via INTRA_ARTICULAR

## 2021-01-25 MED ORDER — ROPIVACAINE HCL 2 MG/ML IJ SOLN
INTRAMUSCULAR | Status: AC
  Filled 2021-01-25: qty 20

## 2021-01-25 MED ORDER — METHYLPREDNISOLONE ACETATE 80 MG/ML IJ SUSP
80.0000 mg | Freq: Once | INTRAMUSCULAR | Status: AC
  Administered 2021-01-25: 80 mg via INTRA_ARTICULAR
  Filled 2021-01-25: qty 1

## 2021-01-25 MED ORDER — LIDOCAINE HCL (PF) 2 % IJ SOLN
INTRAMUSCULAR | Status: AC
  Filled 2021-01-25: qty 20

## 2021-01-25 MED ORDER — LIDOCAINE HCL 2 % IJ SOLN
20.0000 mL | Freq: Once | INTRAMUSCULAR | Status: AC
  Administered 2021-01-25: 400 mg

## 2021-01-25 MED ORDER — METHYLPREDNISOLONE ACETATE 80 MG/ML IJ SUSP
INTRAMUSCULAR | Status: AC
  Filled 2021-01-25: qty 1

## 2021-01-25 MED ORDER — DIAZEPAM 5 MG PO TABS
10.0000 mg | ORAL_TABLET | Freq: Once | ORAL | Status: AC
  Administered 2021-01-25: 10 mg via ORAL
  Filled 2021-01-25: qty 2

## 2021-01-25 MED ORDER — METHYLPREDNISOLONE ACETATE 80 MG/ML IJ SUSP
80.0000 mg | Freq: Once | INTRAMUSCULAR | Status: AC
  Administered 2021-01-25: 80 mg via INTRA_ARTICULAR

## 2021-01-25 MED FILL — Diazepam Tab 5 MG: ORAL | Qty: 2 | Status: AC

## 2021-01-25 NOTE — Progress Notes (Signed)
Safety precautions to be maintained throughout the outpatient stay will include: orient to surroundings, keep bed in low position, maintain call bell within reach at all times, provide assistance with transfer out of bed and ambulation.  

## 2021-01-25 NOTE — Patient Instructions (Signed)

## 2021-01-26 ENCOUNTER — Telehealth: Payer: Self-pay

## 2021-01-26 NOTE — Telephone Encounter (Signed)
Post procedure phone call.  Patient states she is doing ok.  

## 2021-02-05 NOTE — Progress Notes (Signed)
Patient: Danielle [name]  Service Category: E/M  Provider: Gaspar Cola, MD  DOB: [birth year]  DOS: 02/07/2021  Location: Office  MRN: [id number]  Setting: Ambulatory outpatient  Referring Provider: Danae Orleans, MD  Type: Established Patient  Specialty: Interventional Pain Management  PCP: Danae Orleans, MD  Location: Remote location  Delivery: TeleHealth     Virtual Encounter - Pain Management PROVIDER NOTE: Information contained herein reflects review and annotations entered in association with encounter. Interpretation of such information and data should be left to medically-trained personnel. Information provided to patient can be located elsewhere in the medical record under "Patient Instructions". Document created using STT-dictation technology, any transcriptional errors that may result from process are unintentional.    Contact & Pharmacy Preferred: [phone] Home: [phone] (home) Mobile: [phone] (mobile) E-mail: [email or web address]  [place], [place] [address] [place] [address] Phone: [phone] Fax: [phone]   Pre-screening  Danielle [name] offered "in-person" vs "virtual" encounter. She indicated preferring virtual for this encounter.   Reason COVID-19*  Social distancing based on CDC and AMA recommendations.   I contacted Danielle [name] on 02/07/2021 via telephone.      I clearly identified myself as Gaspar Cola, MD. I verified that I was speaking with the correct person using two identifiers (Name: Danielle [name], and date of birth: Jun 05, 1961).  Consent I sought verbal advanced consent from Danielle [name] for virtual visit interactions. I informed Danielle [name] of possible security and privacy concerns, risks, and limitations associated with providing "not-in-person" medical evaluation and management services. I also informed Danielle [name] of the availability of "in-person" appointments.  Finally, I informed her that there would be a charge for the virtual visit and that she could be  personally, fully or partially, financially responsible for it. Danielle [name] expressed understanding and agreed to proceed.   Historic Elements   Danielle [name] is a 60 y.o. year old, female patient evaluated today after our last contact on 01/25/2021. Danielle [name]  has a past medical history of Acute postoperative pain (01/13/2017), Anxiety, Arthritis, Asthma, Bell's palsy, Bursitis, COPD (chronic obstructive pulmonary disease) ([place]), Depression, Fibromyalgia, Heart murmur, Hepatitis C, Hiatal hernia, Hyperlipidemia, Hypertension, IBS (irritable bowel syndrome), Insomnia, and Thyroid disease. She also  has a past surgical history that includes necksurgery (10/21/2014); Back surgery (2013); Foot surgery (Right); Elbow surgery (Left); Carpal tunnel release (Right); Nose surgery; Partial hysterectomy; Total shoulder replacement (Bilateral); and Colonoscopy with propofol (N/A, 02/05/2017). Danielle [name] has a current medication list which includes the following prescription(s): albuterol, cetirizine, cholecalciferol, citalopram, diclofenac sodium, docusate sodium, levothyroxine, magnesium hydroxide, naloxone, nystatin, olopatadine hcl, oxycodone hcl, [START ON 02/10/2021] oxycodone hcl, tizanidine, and trelegy ellipta. She  reports that she has been smoking cigarettes. She has a 80.00 pack-year smoking history. She has never used smokeless tobacco. She reports that she does not drink alcohol and does not use drugs. Danielle [name] is allergic to aripiprazole, duloxetine, gabapentin, gold, iodinated diagnostic agents, naproxen sodium, nortriptyline hcl, nsaids, pregabalin, meperidine, ace inhibitors, aspirin, cefpodoxime, fluoxetine, fluticasone-salmeterol, levofloxacin, lithium, paroxetine, tape, telithromycin, theophylline, topiramate, trazodone, triamcinolone, venlafaxine, bupropion, ketorolac tromethamine, morphine, and  moxifloxacin.   HPI  Today, she is being contacted for a post-procedure assessment.  The patient indicates having attained excellent relief of the pain from the bilateral bursa injections.  At this point she refers having an ongoing 100% relief of the hip pain.  She refers having been able to  go Christmas shopping for her grandkids.  At this point her only complaint is that of shoulder pain on the left side.  Today we have reviewed her x-rays and the last time that she had an x-ray done was on 01/08/2016, after she had her shoulder replacement.  Since she has continued to have pain in that left shoulder, we have decided to repeat that x-ray and see if there is something that has changed.  I recommended that she call her orthopedic surgeon and have them evaluate the x-rays to see if there is anything of concern.  Meanwhile, I will be scheduling her to do a diagnostic suprascapular nerve block which if effective, we may be able to follow-up with radiofrequency ablation.  I have shared the plan with the patient who understood and agreed.    RTCB: 03/12/2021 Nonopioids transferred 05/15/2020: Zanaflex and Voltaren gel  Post-Procedure Evaluation  Procedure (01/25/2021):  Type: Hip bursa injection          Primary Purpose: Palliative Region: Upper (proximal) Femoral Region Level: Hip Joint Target Area: Trochanteric Bursa Approach: Posterolateral approach Laterality: Bilateral  Pre-procedure pain level: 3/10 Post-procedure: L3R1/10 Incomplete relief  Anxiolysis: Please see nurses note.  Effectiveness during initial hour after procedure (Ultra-Short Term Relief):  (100% on left, 50% on right).  Local anesthetic used: Long-acting (4-6 hours) Effectiveness: Defined as any analgesic benefit obtained secondary to the administration of local anesthetics. This carries significant diagnostic value as to the etiological location, or anatomical origin, of the pain. Duration of benefit is expected to coincide  with the duration of the local anesthetic used.  Effectiveness during initial 4-6 hours after procedure (Short-Term Relief):  (100% on left 50% on right).  Long-term benefit: Defined as any relief past the pharmacologic duration of the local anesthetics.  Effectiveness past the initial 6 hours after procedure (Long-Term Relief): 100 % (100% on both left and right).  Benefits, current: Defined as benefit present at the time of this evaluation.   Analgesia:  90-100% better Function: Danielle [name] reports improvement in function ROM: Danielle [name] reports improvement in ROM  Pharmacotherapy Assessment   Analgesic: Oxycodone IR 10 mg, 1 tab PO q 6 hrs (40 mg/day of oxycodone) MME/day: 60 mg/day.   Monitoring: [place] PMP: PDMP reviewed during this encounter.       Pharmacotherapy: No side-effects or adverse reactions reported. Compliance: No problems identified. Effectiveness: Clinically acceptable. Plan: Refer to "POC". UDS:  Summary  Date Value Ref Range Status  11/29/2020 Note  Final    Comment:    ==================================================================== [employer] 13 (MW) ==================================================================== Test                             Result       Flag       Units  Drug Present and Declared for Prescription Verification   Oxycodone                      3039         EXPECTED   ng/mg creat   Oxymorphone                    84           EXPECTED   ng/mg creat   Noroxycodone                   >4098  EXPECTED   ng/mg creat   Noroxymorphone                 24           EXPECTED   ng/mg creat    Sources of oxycodone are scheduled prescription medications.    Oxymorphone, noroxycodone, and noroxymorphone are expected    metabolites of oxycodone. Oxymorphone is also available as a    scheduled prescription medication.  ==================================================================== Test                      Result    Flag    Units      Ref Range   Creatinine              244              mg/dL      >=20 ==================================================================== Declared Medications:  The flagging and interpretation on this report are based on the  following declared medications.  Unexpected results may arise from  inaccuracies in the declared medications.   **Note: The testing scope of this panel includes these medications:   Oxycodone   **Note: The testing scope of this panel does not include the  following reported medications:   Albuterol  Cetirizine  Citalopram  Docusate  Eye Drop  Fluticasone (Trelegy)  Levothyroxine  Magnesium (Milk of Magnesia)  Naloxone  Tizanidine  Topical  Topical Diclofenac  Umeclidinium (Trelegy)  Vilanterol (Trelegy)  Vitamin D ==================================================================== For clinical consultation, please call [phone]. ====================================================================      Laboratory Chemistry Profile   Renal Lab Results  Component Value Date   BUN 14 04/28/2019   CREATININE 0.98 04/28/2019   BCR 13 02/16/2019   GFRAA >60 04/28/2019   GFRNONAA >60 04/28/2019    Hepatic Lab Results  Component Value Date   AST 18 04/28/2019   ALT 16 04/28/2019   ALBUMIN 3.7 04/28/2019   ALKPHOS 80 04/28/2019    Electrolytes Lab Results  Component Value Date   NA 139 04/28/2019   K 4.1 04/28/2019   CL 105 04/28/2019   CALCIUM 9.3 04/28/2019   MG 2.1 02/16/2019    Bone Lab Results  Component Value Date   25OHVITD1 27 (L) 02/16/2019   25OHVITD2 2.0 02/16/2019   25OHVITD3 25 02/16/2019    Inflammation (CRP: Acute Phase) (ESR: Chronic Phase) Lab Results  Component Value Date   CRP 2 02/16/2019   ESRSEDRATE 23 02/16/2019         Note: Above Lab results reviewed.  Imaging  DG PAIN CLINIC C-ARM 1-60 MIN NO REPORT Fluoro was used, but no Radiologist interpretation will be provided.  Please refer  to "NOTES" tab for provider progress note.  Assessment  The primary encounter diagnosis was Chronic hip pain (2ry area of Pain) (Bilateral) (R>L). 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  Plan of Care  Problem-specific:  No problem-specific Assessment & Plan notes found for this encounter.  Danielle [name] has a current medication list which includes the following long-term medication(s): albuterol, citalopram, diclofenac sodium, levothyroxine, olopatadine hcl, oxycodone hcl, [START ON 02/10/2021] oxycodone hcl, and tizanidine.  Pharmacotherapy (Medications Ordered): No orders of the defined types were placed in this encounter.  Orders:  Orders Placed This Encounter  Procedures   SUPRASCAPULAR NERVE BLOCK    For  shoulder pain.    Standing Status:   Future    Standing Expiration Date:   05/10/2021    Scheduling Instructions:     Purpose: Diagnostic     Laterality: Left-sided     Level(s): Suprascapular notch     Sedation: Patient's choice.     Scheduling Timeframe: As permitted by the schedule    Order Specific Question:   Where will this procedure be performed?    Answer:   [facility]   DG Shoulder Left    Standing Status:   Future    Standing Expiration Date:   03/10/2021    Scheduling Instructions:     Imaging must be done as soon as possible. Inform patient that order will expire within 30 days and I will not renew it.    Order Specific Question:   Reason for Exam (SYMPTOM  OR DIAGNOSIS REQUIRED)    Answer:   Left shoulder pain    Order Specific Question:   Is the patient pregnant?    Answer:   No    Order Specific Question:   Preferred imaging location?    Answer:   [facility]    Order Specific Question:   Call Results- Best Contact Number?    Answer:   [phone]  [phone] ([facility])    Order Specific Question:   Release to patient    Answer:   Immediate    Follow-up plan:   Return for Procedure (no sedation): (L) SSNB #1.     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  Allergic to NSAIDs.  Abnormal UDS (01/02/2016). (+) Unreported tramadol.   Planned  Pending:   Pending further evaluation   Under consideration:   Possible bilateral hip joint RFA  Diagnostic caudal ESI + epidurogram  Possible Racz procedure.  Possible bilateral cervical facet RFA   Diagnostic right CESI  Diagnostic left sacroiliac joint block  Possible bilateral sacroiliac joint RFA    Completed:   Palliative bilateral trochanteric bursa x1  Palliative bilateral suprascapular nerve block x3 (08/08/2020) Palliative bilateral suprascapular nerve RFA x1 (05/13/2019) Diagnostic right IA hip joint injection x3 (06/01/2020)  Diagnostic left IA hip joint injection x2 (04/30/2018)  Diagnostic bilateral lumbar facet block x3  Palliative right lumbar facet RFA x2 (12/16/2017)  Palliative left lumbar facet RFA x2 (02/12/2018)  Diagnostic bilateral cervical facet block x1  Diagnostic right SI joint block x1    Therapeutic  Palliative (PRN) options:   Palliative bilateral trochanteric bursa #2  Palliative bilateral suprascapular nerve block #4  Palliative bilateral suprascapular nerve RFA #2  Diagnostic right IA hip joint injection #4  Diagnostic left IA hip joint injection #3  Diagnostic bilateral lumbar facet block #4  Palliative right lumbar facet RFA #3  Palliative left lumbar facet RFA #3  Diagnostic bilateral cervical facet block #2  Diagnostic right SI joint block #2         Recent Visits Date Type Provider Dept  01/25/21 Procedure visit Milinda Pointer, MD [facility]  11/29/20 Office Visit Milinda Pointer, MD [facility]  Showing recent visits within past  90 days and meeting all other requirements Today's Visits Date Type Provider Dept  02/07/21 Telemedicine Milinda Pointer, MD [facility]  Showing today's visits and meeting all other requirements Future Appointments Date Type Provider Dept  03/12/21 Appointment Milinda Pointer, MD [facility]  Showing future appointments within next 90 days and meeting  all other requirements I discussed the assessment and treatment plan with the patient. The patient was provided an opportunity to ask questions and all were answered. The patient agreed with the plan and demonstrated an understanding of the instructions.  Patient advised to call back or seek an in-person evaluation if the symptoms or condition worsens.  Duration of encounter: 25 minutes.  Note by: Gaspar Cola, MD Date: 02/07/2021; Time: 7:10 PM

## 2021-02-07 ENCOUNTER — Ambulatory Visit: Payer: Medicaid Other | Attending: Pain Medicine | Admitting: Pain Medicine

## 2021-02-07 ENCOUNTER — Other Ambulatory Visit: Payer: Self-pay

## 2021-02-07 DIAGNOSIS — M25512 Pain in left shoulder: Secondary | ICD-10-CM | POA: Insufficient documentation

## 2021-02-07 DIAGNOSIS — M160 Bilateral primary osteoarthritis of hip: Secondary | ICD-10-CM | POA: Diagnosis not present

## 2021-02-07 DIAGNOSIS — M7062 Trochanteric bursitis, left hip: Secondary | ICD-10-CM

## 2021-02-07 DIAGNOSIS — M25551 Pain in right hip: Secondary | ICD-10-CM

## 2021-02-07 DIAGNOSIS — M7061 Trochanteric bursitis, right hip: Secondary | ICD-10-CM

## 2021-02-07 DIAGNOSIS — Z96612 Presence of left artificial shoulder joint: Secondary | ICD-10-CM

## 2021-02-07 DIAGNOSIS — G8929 Other chronic pain: Secondary | ICD-10-CM

## 2021-02-07 DIAGNOSIS — G894 Chronic pain syndrome: Secondary | ICD-10-CM

## 2021-02-07 DIAGNOSIS — M25552 Pain in left hip: Secondary | ICD-10-CM

## 2021-02-13 ENCOUNTER — Ambulatory Visit
Admission: RE | Admit: 2021-02-13 | Discharge: 2021-02-13 | Disposition: A | Discharge status: Home / Self Care | Payer: Medicaid Other | Source: Ambulatory Visit | Attending: Pain Medicine | Admitting: Pain Medicine

## 2021-02-13 DIAGNOSIS — Z96612 Presence of left artificial shoulder joint: Secondary | ICD-10-CM | POA: Insufficient documentation

## 2021-02-13 DIAGNOSIS — G8929 Other chronic pain: Secondary | ICD-10-CM | POA: Diagnosis present

## 2021-02-13 DIAGNOSIS — M25512 Pain in left shoulder: Secondary | ICD-10-CM | POA: Diagnosis present

## 2021-02-19 NOTE — Progress Notes (Signed)
PROVIDER NOTE: Information contained herein reflects review and annotations entered in association with encounter. Interpretation of such information and data should be left to medically-trained personnel. Information provided to patient can be located elsewhere in the medical record under "Patient Instructions". Document created using STT-dictation technology, any transcriptional errors that may result from process are unintentional.    Patient: Danielle [name]  Service Category: Procedure  Provider: Gaspar Cola, MD  DOB: Aug 14, 1960  DOS: 02/22/2021  Location: [facility]  MRN: [id number]  Setting: Ambulatory - outpatient  Referring Provider: Milinda Pointer, MD  Type: Established Patient  Specialty: Interventional Pain Management  PCP: Danae Orleans, MD   Primary Reason for Visit: Interventional Pain Management Treatment. CC: Arm Pain (left) and Shoulder Pain (left)  Procedure:          Anesthesia, Analgesia, Anxiolysis:  Type: Diagnostic Suprascapular nerve Block          Primary Purpose: Diagnostic Region: Posterior Shoulder & Scapular Areas Level: Superior to the scapular spine, in the lateral aspect of the supraspinatus fossa (Suprescapular notch). Target Area: Suprascapular nerve as it passes thru the lower portion of the suprascapular notch. Approach: Posterior percutaneous approach. Laterality: Left-Side  Type: Local Anesthesia Indication(s): Analgesia         Route: Infiltration ([place]/IM) IV Access: Declined Sedation: Declined  Local Anesthetic: Lidocaine 1-2%  Position: Prone   Indications: 1. Acetabular labrum tear, sequela (Left)   2. Chronic shoulder pain (Left)   3. History of total shoulder replacement (Left)   4. Chronic pain syndrome   5. Pharmacologic therapy   6. Chronic use of opiate for therapeutic purpose    Pain Score: Pre-procedure: 1 /10 Post-procedure: 0-No pain/10   RTCB: 06/10/2021 Nonopioids transferred 05/15/2020:  Zanaflex and Voltaren gel  Pre-op H&P Assessment:  [name] is a 60 y.o. (year old), female patient, seen today for interventional treatment. She  has a past surgical history that includes necksurgery (10/21/2014); Back surgery (2013); Foot surgery (Right); Elbow surgery (Left); Carpal tunnel release (Right); Nose surgery; Partial hysterectomy; Total shoulder replacement (Bilateral); and Colonoscopy with propofol (N/A, 02/05/2017). [name] has a current medication list which includes the following prescription(s): albuterol, cetirizine, cholecalciferol, citalopram, diclofenac sodium, docusate sodium, levothyroxine, magnesium hydroxide, naloxone, nystatin, olopatadine hcl, tizanidine, trelegy ellipta, [START ON 03/12/2021] oxycodone hcl, [START ON 04/11/2021] oxycodone hcl, and [START ON 05/11/2021] oxycodone hcl. Her primarily concern today is the Arm Pain (left) and Shoulder Pain (left)  Initial Vital Signs:  Pulse/HCG Rate: 98  Temp: (!) 97.2 F (36.2 C) Resp: 18 BP: 125/71 SpO2: 96 %  BMI: Estimated body mass index is 39.11 kg/m as calculated from the following:   Height as of this encounter: '5\' 5"'$  (1.651 m).   Weight as of this encounter: 235 lb (106.6 kg).  Risk Assessment: Allergies: Reviewed. She is allergic to aripiprazole, duloxetine, gabapentin, gold, iodinated diagnostic agents, naproxen sodium, nortriptyline hcl, nsaids, pregabalin, meperidine, ace inhibitors, aspirin, cefpodoxime, fluoxetine, fluticasone-salmeterol, levofloxacin, lithium, paroxetine, tape, telithromycin, theophylline, topiramate, trazodone, triamcinolone, venlafaxine, bupropion, ketorolac tromethamine, morphine, and moxifloxacin.  Allergy Precautions: None required Coagulopathies: Reviewed. None identified.  Blood-thinner therapy: None at this time Active Infection(s): Reviewed. None identified. [name] is afebrile  Site Confirmation: [name] was asked to confirm the procedure and laterality before  marking the site Procedure checklist: Completed Consent: Before the procedure and under the influence of no sedative(s), amnesic(s), or anxiolytics, the patient was informed of the treatment options, risks and possible complications. To fulfill our  ethical and legal obligations, as recommended by the American Medical Association's Code of Ethics, I have informed the patient of my clinical impression; the nature and purpose of the treatment or procedure; the risks, benefits, and possible complications of the intervention; the alternatives, including doing nothing; the risk(s) and benefit(s) of the alternative treatment(s) or procedure(s); and the risk(s) and benefit(s) of doing nothing. The patient was provided information about the general risks and possible complications associated with the procedure. These may include, but are not limited to: failure to achieve desired goals, infection, bleeding, organ or nerve damage, allergic reactions, paralysis, and death. In addition, the patient was informed of those risks and complications associated to the procedure, such as failure to decrease pain; infection; bleeding; organ or nerve damage with subsequent damage to sensory, motor, and/or autonomic systems, resulting in permanent pain, numbness, and/or weakness of one or several areas of the body; allergic reactions; (i.e.: anaphylactic reaction); and/or death. Furthermore, the patient was informed of those risks and complications associated with the medications. These include, but are not limited to: allergic reactions (i.e.: anaphylactic or anaphylactoid reaction(s)); adrenal axis suppression; blood sugar elevation that in diabetics may result in ketoacidosis or comma; water retention that in patients with history of congestive heart failure may result in shortness of breath, pulmonary edema, and decompensation with resultant heart failure; weight gain; swelling or edema; medication-induced neural toxicity;  particulate matter embolism and blood vessel occlusion with resultant organ, and/or nervous system infarction; and/or aseptic necrosis of one or more joints. Finally, the patient was informed that Medicine is not an exact science; therefore, there is also the possibility of unforeseen or unpredictable risks and/or possible complications that may result in a catastrophic outcome. The patient indicated having understood very clearly. We have given the patient no guarantees and we have made no promises. Enough time was given to the patient to ask questions, all of which were answered to the patient's satisfaction. Ms. Perkowski has indicated that she wanted to continue with the procedure. Attestation: I, the ordering provider, attest that I have discussed with the patient the benefits, risks, side-effects, alternatives, likelihood of achieving goals, and potential problems during recovery for the procedure that I have provided informed consent. Date  Time: 02/22/2021 10:44 AM  Pre-Procedure Preparation:  Monitoring: As per clinic protocol. Respiration, ETCO2, SpO2, BP, heart rate and rhythm monitor placed and checked for adequate function Safety Precautions: Patient was assessed for positional comfort and pressure points before starting the procedure. Time-out: I initiated and conducted the "Time-out" before starting the procedure, as per protocol. The patient was asked to participate by confirming the accuracy of the "Time Out" information. Verification of the correct person, site, and procedure were performed and confirmed by me, the nursing staff, and the patient. "Time-out" conducted as per Joint Commission's Universal Protocol (UP.01.01.01). Time: 1129  Description of Procedure:          Area Prepped: Entire shoulder Area DuraPrep (Iodine Povacrylex [0.7% available iodine] and Isopropyl Alcohol, 74% w/w) Safety Precautions: Aspiration looking for blood return was conducted prior to all injections. At  no point did we inject any substances, as a needle was being advanced. No attempts were made at seeking any paresthesias. Safe injection practices and needle disposal techniques used. Medications properly checked for expiration dates. SDV (single dose vial) medications used. Description of the Procedure: Protocol guidelines were followed. The patient was placed in position over the procedure table. The target area was identified and the area prepped in the  usual manner. Skin & deeper tissues infiltrated with local anesthetic. Appropriate amount of time allowed to pass for local anesthetics to take effect. The procedure needles were then advanced to the target area. Proper needle placement secured. Negative aspiration confirmed. Solution injected in intermittent fashion, asking for systemic symptoms every 0.5cc of injectate. The needles were then removed and the area cleansed, making sure to leave some of the prepping solution back to take advantage of its long term bactericidal properties.  Vitals:   02/22/21 1043 02/22/21 1128 02/22/21 1132 02/22/21 1136  BP: 125/71 137/88 131/84 132/82  Pulse: 98 76 82 81  Resp: '18 13 15 16  '$ Temp: (!) 97.2 F (36.2 C)     SpO2: 96% 98% 97% 98%  Weight: 235 lb (106.6 kg)     Height: '5\' 5"'$  (1.651 m)       Start Time: 1129 hrs. End Time: 1134 hrs. Materials:  Needle(s) Type: Spinal Needle Gauge: 22G Length: 3.5-in Medication(s): Please see orders for medications and dosing details.  Imaging Guidance (Non-Spinal):          Type of Imaging Technique: Fluoroscopy Guidance (Non-Spinal) Indication(s): Assistance in needle guidance and placement for procedures requiring needle placement in or near specific anatomical locations not easily accessible without such assistance. Exposure Time: Please see nurses notes. Contrast: Before injecting any contrast, we confirmed that the patient did not have an allergy to iodine, shellfish, or radiological contrast. Once  satisfactory needle placement was completed at the desired level, radiological contrast was injected. Contrast injected under live fluoroscopy. No contrast complications. See chart for type and volume of contrast used. Fluoroscopic Guidance: I was personally present during the use of fluoroscopy. "Tunnel Vision Technique" used to obtain the best possible view of the target area. Parallax error corrected before commencing the procedure. "Direction-depth-direction" technique used to introduce the needle under continuous pulsed fluoroscopy. Once target was reached, antero-posterior, oblique, and lateral fluoroscopic projection used confirm needle placement in all planes. Images permanently stored in EMR. Interpretation: I personally interpreted the imaging intraoperatively. Adequate needle placement confirmed in multiple planes. Appropriate spread of contrast into desired area was observed. No evidence of afferent or efferent intravascular uptake. Permanent images saved into the patient's record.  Antibiotic Prophylaxis:   Anti-infectives (From admission, onward)    None      Indication(s): None identified  Post-operative Assessment:  Post-procedure Vital Signs:  Pulse/HCG Rate: 81 (nsr)  Temp: (!) 97.2 F (36.2 C) Resp: 16 BP: 132/82 SpO2: 98 %  EBL: None  Complications: No immediate post-treatment complications observed by team, or reported by patient.  Note: The patient tolerated the entire procedure well. A repeat set of vitals were taken after the procedure and the patient was kept under observation following institutional policy, for this type of procedure. Post-procedural neurological assessment was performed, showing return to baseline, prior to discharge. The patient was provided with post-procedure discharge instructions, including a section on how to identify potential problems. Should any problems arise concerning this procedure, the patient was given instructions to immediately  contact us, at any time, without hesitation. In any case, we plan to contact the patient by telephone for a follow-up status report regarding this interventional procedure.  Comments:  No additional relevant information.  Plan of Care  Orders:  Orders Placed This Encounter  Procedures   SUPRASCAPULAR NERVE BLOCK    For shoulder pain.    Scheduling Instructions:     Purpose: Diagnostic     Laterality: Left-sided  Level(s): Suprascapular notch     Sedation: No Sedation.     Scheduling Timeframe: Today    Order Specific Question:   Where will this procedure be performed?    Answer:   [facility]   DG PAIN [facility] C-ARM 1-60 MIN NO REPORT    Intraoperative interpretation by procedural physician at [place].    Standing Status:   Standing    Number of Occurrences:   1    Order Specific Question:   Reason for exam:    Answer:   Assistance in needle guidance and placement for procedures requiring needle placement in or near specific anatomical locations not easily accessible without such assistance.   Informed Consent Details: Physician/Practitioner Attestation; Transcribe to consent form and obtain patient signature    Note: Always confirm laterality of pain with Ms. Domingo Cocking, before procedure.    Order Specific Question:   Physician/Practitioner attestation of informed consent for procedure/surgical case    Answer:   I, the physician/practitioner, attest that I have discussed with the patient the benefits, risks, side effects, alternatives, likelihood of achieving goals and potential problems during recovery for the procedure that I have provided informed consent.    Order Specific Question:   Procedure    Answer:   Suprascapular Nerve Block    Order Specific Question:   Physician/Practitioner performing the procedure    Answer:   Omer Puccinelli A. [name], MD    Order Specific Question:   Indication/Reason    Answer:   Chronic shoulder pain (arthralgia)   Provide  equipment / supplies at bedside    "Block Tray" (Disposable  single use) Needle type: SpinalSpinal Amount/quantity: 1 Size: Regular (3.5-inch) Gauge: 22G    Standing Status:   Standing    Number of Occurrences:   1    Order Specific Question:   Specify    Answer:   Block Tray    Chronic Opioid Analgesic:  Oxycodone IR 10 mg, 1 tab PO q 6 hrs (40 mg/day of oxycodone) MME/day: 60 mg/day.   Medications ordered for procedure: Meds ordered this encounter  Medications   lidocaine (XYLOCAINE) 2 % (with pres) injection 400 mg   ropivacaine (PF) 2 mg/mL (0.2%) (NAROPIN) injection 4 mL   triamcinolone acetonide (KENALOG-40) injection 40 mg   Oxycodone HCl 10 MG TABS    Sig: Take 1 tablet (10 mg total) by mouth every 6 (six) hours as needed. Must last 30 days    Dispense:  120 tablet    Refill:  0    Not a duplicate. Do NOT delete! Dispense 1 day early if closed on refill date. Avoid benzodiazepines within 8 hours of opioids. Do not send refill requests.   Oxycodone HCl 10 MG TABS    Sig: Take 1 tablet (10 mg total) by mouth every 6 (six) hours as needed. Must last 30 days    Dispense:  120 tablet    Refill:  0    Not a duplicate. Do NOT delete! Dispense 1 day early if closed on refill date. Avoid benzodiazepines within 8 hours of opioids. Do not send refill requests.   Oxycodone HCl 10 MG TABS    Sig: Take 1 tablet (10 mg total) by mouth every 6 (six) hours as needed. Must last 30 days    Dispense:  120 tablet    Refill:  0    Not a duplicate. Do NOT delete! Dispense 1 day early if closed on refill date. Avoid benzodiazepines within 8  hours of opioids. Do not send refill requests.    Medications administered: We administered lidocaine, ropivacaine (PF) 2 mg/mL (0.2%), and triamcinolone acetonide.  See the medical record for exact dosing, route, and time of administration.  Follow-up plan:   Return in about 2 weeks (around 03/08/2021) for (T,Th) (VV) (PPE).      Interventional  Therapies  Risk  Complexity Considerations:   Multiple allergies Anaphylactic Allergy to contrast dye.  Allergic to NSAIDs.  Abnormal UDS (01/02/2016). (+) Unreported tramadol.   Planned  Pending:   Pending further evaluation   Under consideration:   Possible bilateral hip joint RFA  Diagnostic caudal ESI + epidurogram  Possible Racz procedure.  Possible bilateral cervical facet RFA   Diagnostic right CESI  Diagnostic left sacroiliac joint block  Possible bilateral sacroiliac joint RFA    Completed:   Palliative bilateral trochanteric bursa x1  Palliative bilateral suprascapular nerve block x3 (08/08/2020) Palliative bilateral suprascapular nerve RFA x1 (05/13/2019) Diagnostic right IA hip joint injection x3 (06/01/2020)  Diagnostic left IA hip joint injection x2 (04/30/2018)  Diagnostic bilateral lumbar facet block x3  Palliative right lumbar facet RFA x2 (12/16/2017)  Palliative left lumbar facet RFA x2 (02/12/2018)  Diagnostic bilateral cervical facet block x1  Diagnostic right SI joint block x1    Therapeutic  Palliative (PRN) options:   Palliative bilateral trochanteric bursa #2  Palliative bilateral suprascapular nerve block #4  Palliative bilateral suprascapular nerve RFA #2  Diagnostic right IA hip joint injection #4  Diagnostic left IA hip joint injection #3  Diagnostic bilateral lumbar facet block #4  Palliative right lumbar facet RFA #3  Palliative left lumbar facet RFA #3  Diagnostic bilateral cervical facet block #2  Diagnostic right SI joint block #2          Recent Visits Date Type Provider Dept  02/07/21 Telemedicine Milinda Pointer, MD [facility]  01/25/21 Procedure visit Milinda Pointer, MD [facility]  11/29/20 Office Visit Milinda Pointer, MD [facility]  Showing recent visits within past 90 days and meeting all other requirements Today's Visits Date Type Provider Dept  02/22/21 Procedure visit Milinda Pointer, MD [facility]  Showing today's visits and meeting all other requirements Future Appointments Date Type Provider Dept  03/07/21 Appointment Milinda Pointer, MD [facility]  Showing future appointments within next 90 days and meeting all other requirements Disposition: Discharge home  Discharge (Date  Time): 02/22/2021; 1138 hrs.   Primary Care Physician: Danae Orleans, MD Location: [facility] Note by: Gaspar Cola, MD Date: 02/22/2021; Time: 12:31 PM  Disclaimer:  Medicine is not an Chief Strategy Officer. The only guarantee in medicine is that nothing is guaranteed. It is important to note that the decision to proceed with this intervention was based on the information collected from the patient. The Data and conclusions were drawn from the patient's questionnaire, the interview, and the physical examination. Because the information was provided in large part by the patient, it cannot be guaranteed that it has not been purposely or unconsciously manipulated. Every effort has been made to obtain as much relevant data as possible for this evaluation. It is important to note that the conclusions that lead to this procedure are derived in large part from the available data. Always take into account that the treatment will also be dependent on availability of resources and existing treatment guidelines, considered by other Pain Management Practitioners as being common knowledge and practice, at the time  of the intervention. For Medico-Legal purposes, it is also important to point out that variation in procedural techniques and pharmacological choices are the acceptable norm. The indications, contraindications, technique, and results of the above procedure should only be interpreted and judged by a Board-Certified Interventional Pain Specialist with extensive familiarity and expertise in the same exact procedure and technique.

## 2021-02-22 ENCOUNTER — Other Ambulatory Visit: Payer: Self-pay

## 2021-02-22 ENCOUNTER — Ambulatory Visit
Admission: RE | Admit: 2021-02-22 | Discharge: 2021-02-22 | Disposition: A | Discharge status: Home / Self Care | Payer: Medicaid Other | Source: Ambulatory Visit | Attending: Pain Medicine | Admitting: Pain Medicine

## 2021-02-22 ENCOUNTER — Encounter: Payer: Self-pay | Admitting: Pain Medicine

## 2021-02-22 ENCOUNTER — Ambulatory Visit (HOSPITAL_BASED_OUTPATIENT_CLINIC_OR_DEPARTMENT_OTHER): Payer: Medicaid Other | Admitting: Pain Medicine

## 2021-02-22 VITALS — BP 132/82 | HR 81 | Temp 97.2°F | Resp 16 | Ht 65.0 in | Wt 235.0 lb

## 2021-02-22 DIAGNOSIS — S73192S Other sprain of left hip, sequela: Secondary | ICD-10-CM

## 2021-02-22 DIAGNOSIS — Z96611 Presence of right artificial shoulder joint: Secondary | ICD-10-CM | POA: Insufficient documentation

## 2021-02-22 DIAGNOSIS — M25512 Pain in left shoulder: Secondary | ICD-10-CM | POA: Diagnosis present

## 2021-02-22 DIAGNOSIS — G894 Chronic pain syndrome: Secondary | ICD-10-CM | POA: Diagnosis not present

## 2021-02-22 DIAGNOSIS — Z885 Allergy status to narcotic agent status: Secondary | ICD-10-CM | POA: Insufficient documentation

## 2021-02-22 DIAGNOSIS — G8929 Other chronic pain: Secondary | ICD-10-CM | POA: Diagnosis not present

## 2021-02-22 DIAGNOSIS — Z886 Allergy status to analgesic agent status: Secondary | ICD-10-CM | POA: Insufficient documentation

## 2021-02-22 DIAGNOSIS — M79602 Pain in left arm: Secondary | ICD-10-CM | POA: Insufficient documentation

## 2021-02-22 DIAGNOSIS — Z79891 Long term (current) use of opiate analgesic: Secondary | ICD-10-CM

## 2021-02-22 DIAGNOSIS — Z79899 Other long term (current) drug therapy: Secondary | ICD-10-CM | POA: Diagnosis not present

## 2021-02-22 DIAGNOSIS — Z96612 Presence of left artificial shoulder joint: Secondary | ICD-10-CM | POA: Insufficient documentation

## 2021-02-22 DIAGNOSIS — Z888 Allergy status to other drugs, medicaments and biological substances status: Secondary | ICD-10-CM | POA: Insufficient documentation

## 2021-02-22 DIAGNOSIS — Z881 Allergy status to other antibiotic agents status: Secondary | ICD-10-CM | POA: Insufficient documentation

## 2021-02-22 MED ORDER — OXYCODONE HCL 10 MG PO TABS: 10.0000 mg | ORAL_TABLET | Freq: Four times a day (QID) | ORAL | 0 refills | Status: DC | PRN

## 2021-02-22 MED ORDER — LIDOCAINE HCL (PF) 2 % IJ SOLN
INTRAMUSCULAR | Status: AC
  Filled 2021-02-22: qty 5

## 2021-02-22 MED ORDER — TRIAMCINOLONE ACETONIDE 40 MG/ML IJ SUSP
40.0000 mg | Freq: Once | INTRAMUSCULAR | Status: AC
  Administered 2021-02-22: 40 mg
  Filled 2021-02-22: qty 1

## 2021-02-22 MED ORDER — LIDOCAINE HCL 2 % IJ SOLN
20.0000 mL | Freq: Once | INTRAMUSCULAR | Status: AC
  Administered 2021-02-22: 100 mg

## 2021-02-22 MED ORDER — ROPIVACAINE HCL 2 MG/ML IJ SOLN
4.0000 mL | Freq: Once | INTRAMUSCULAR | Status: AC
  Administered 2021-02-22: 4 mL via PERINEURAL

## 2021-02-22 NOTE — Patient Instructions (Signed)

## 2021-02-22 NOTE — Progress Notes (Signed)
Nursing Pain Medication Assessment:  Safety precautions to be maintained throughout the outpatient stay will include: orient to surroundings, keep bed in low position, maintain call bell within reach at all times, provide assistance with transfer out of bed and ambulation.  Medication Inspection Compliance: Pill count conducted under aseptic conditions, in front of the patient. Neither the pills nor the bottle was removed from the patient's sight at any time. Once count was completed pills were immediately returned to the patient in their original bottle.  Medication: Oxycodone IR Pill/Patch Count:  75 of 120 pills remain Pill/Patch Appearance: Markings consistent with prescribed medication Bottle Appearance: Standard pharmacy container. Clearly labeled. Filled Date: 07 / 52 / 2022 Last Medication intake:  Today

## 2021-02-23 ENCOUNTER — Telehealth: Payer: Self-pay

## 2021-02-23 NOTE — Telephone Encounter (Signed)
Post procedure phone call.  Patinet states she is doing good.

## 2021-02-28 ENCOUNTER — Encounter: Payer: Medicaid Other | Admitting: Pain Medicine

## 2021-02-28 ENCOUNTER — Telehealth: Payer: Self-pay

## 2021-02-28 NOTE — Telephone Encounter (Signed)
PA faxed over to [facility] in [place] nothing further needed.

## 2021-03-01 NOTE — Telephone Encounter (Signed)
Looked up PA key [id number] and this is what it said: The [facility] Prior Authorization Team is not able to review this request because the requested medication has been previously approved under [id number]. Based on the information reviewed, the requested prescription is currently authorized for coverage by the plan until 2021-12-26.  Trelegy is already approved. Nothing further needed at this time.

## 2021-03-06 ENCOUNTER — Encounter: Payer: Self-pay | Admitting: Pain Medicine

## 2021-03-06 ENCOUNTER — Telehealth: Payer: Self-pay

## 2021-03-06 NOTE — Telephone Encounter (Signed)
LM for patient to call office to go over pre virtual appointment questions.  

## 2021-03-06 NOTE — Progress Notes (Signed)
Patient: Danielle [name]  Service Category: E/M  Provider: Gaspar Cola, MD  DOB: July 07, 1961  DOS: 03/07/2021  Location: Office  MRN: [id number]  Setting: Ambulatory outpatient  Referring Provider: Danae Orleans, MD  Type: Established Patient  Specialty: Interventional Pain Management  PCP: Danae Orleans, MD  Location: Remote location  Delivery: TeleHealth     Virtual Encounter - Pain Management PROVIDER NOTE: Information contained herein reflects review and annotations entered in association with encounter. Interpretation of such information and data should be left to medically-trained personnel. Information provided to patient can be located elsewhere in the medical record under "Patient Instructions". Document created using STT-dictation technology, any transcriptional errors that may result from process are unintentional.    Contact & Pharmacy Preferred: [phone] Home: [phone] (home) Mobile: [phone] (mobile) E-mail: [email or web address]  [place], [place] [address] [place] [address] Phone: [phone] Fax: [phone]   Pre-screening  Danielle [name] offered "in-person" vs "virtual" encounter. She indicated preferring virtual for this encounter.   Reason COVID-19*  Social distancing based on CDC and AMA recommendations.   I contacted Danielle [name] on 03/07/2021 via telephone.      I clearly identified myself as Gaspar Cola, MD. I verified that I was speaking with the correct person using two identifiers (Name: Danielle [name], and date of birth: 05/07/1961).  Consent I sought verbal advanced consent from Danielle [name] for virtual visit interactions. I informed [name] of possible security and privacy concerns, risks, and limitations associated with providing "not-in-person" medical evaluation and management services. I also informed [name] of the availability of "in-person" appointments.  Finally, I informed her that there would be a charge for the virtual visit and that she could be  personally, fully or partially, financially responsible for it. Ms. Vellucci expressed understanding and agreed to proceed.   Historic Elements   Ms. BRITTENEY AYOTTE is a 60 y.o. year old, female patient evaluated today after our last contact on 02/22/2021. Danielle [name]  has a past medical history of Acute postoperative pain (01/13/2017), Anxiety, Arthritis, Asthma, Bell's palsy, Bursitis, COPD (chronic obstructive pulmonary disease) ([place]), Depression, Fibromyalgia, Heart murmur, Hepatitis C, Hiatal hernia, Hyperlipidemia, Hypertension, IBS (irritable bowel syndrome), Insomnia, and Thyroid disease. She also  has a past surgical history that includes necksurgery (10/21/2014); Back surgery (2013); Foot surgery (Right); Elbow surgery (Left); Carpal tunnel release (Right); Nose surgery; Partial hysterectomy; Total shoulder replacement (Bilateral); and Colonoscopy with propofol (N/A, 02/05/2017). Danielle [name] has a current medication list which includes the following prescription(s): albuterol, cetirizine, cholecalciferol, diclofenac sodium, docusate sodium, levothyroxine, magnesium hydroxide, naloxone, nystatin, olopatadine hcl, [START ON 03/12/2021] oxycodone hcl, [START ON 04/11/2021] oxycodone hcl, [START ON 05/11/2021] oxycodone hcl, tizanidine, trelegy ellipta, and citalopram. She  reports that she has been smoking cigarettes. She has a 80.00 pack-year smoking history. She has never used smokeless tobacco. She reports that she does not drink alcohol and does not use drugs. Danielle [name] is allergic to aripiprazole, duloxetine, gabapentin, gold, iodinated diagnostic agents, naproxen sodium, nortriptyline hcl, nsaids, pregabalin, meperidine, ace inhibitors, aspirin, cefpodoxime, fluoxetine, fluticasone-salmeterol, levofloxacin, lithium, paroxetine, tape, telithromycin, theophylline, topiramate, trazodone, triamcinolone,  venlafaxine, bupropion, ketorolac tromethamine, morphine, and moxifloxacin.   HPI  Today, she is being contacted for a post-procedure assessment.  Today I contacted the patient read at the time that she was having x-rays done to screen for breast cancer.  The patient appears to have obtained an ongoing  90% relief of the shoulder pain with the diagnostic suprascapular nerve block suggesting that radiofrequency ablation may be an option.  The last time that we did a radiofrequency ablation of the suprascapular nerve for this patient was on 05/13/2019.  Post-Procedure Evaluation  Procedure (02/22/2021):  Procedure:           Anesthesia, Analgesia, Anxiolysis:  Type: Diagnostic/therapeutic suprascapular nerve Block #4 Primary Purpose: Diagnostic/therapeutic Region: Posterior Shoulder & Scapular Areas Level: Superior to the scapular spine, in the lateral aspect of the supraspinatus fossa (Suprescapular notch). Target Area: Suprascapular nerve as it passes thru the lower portion of the suprascapular notch. Approach: Posterior percutaneous approach. Laterality: Left-Side   Type: Local Anesthesia Indication(s): Analgesia         Route: Infiltration ([place]/IM) IV Access: Declined Sedation: Declined  Local Anesthetic: Lidocaine 1-2%   Position: Prone    Indications: 1. Acetabular labrum tear, sequela (Left)   2. Chronic shoulder pain (Left)   3. History of total shoulder replacement (Left)   4. Chronic pain syndrome   5. Pharmacologic therapy   6. Chronic use of opiate for therapeutic purpose     Pain Score: Pre-procedure: 1 /10 Post-procedure: 0-No pain/10   Anxiolysis: none.  Effectiveness during initial hour after procedure (Ultra-Short Term Relief): 100 %.  Local anesthetic used: Long-acting (4-6 hours) Effectiveness: Defined as any analgesic benefit obtained secondary to the administration of local anesthetics. This carries significant diagnostic value as to the etiological location,  or anatomical origin, of the pain. Duration of benefit is expected to coincide with the duration of the local anesthetic used.  Effectiveness during initial 4-6 hours after procedure (Short-Term Relief): 100 %.  Long-term benefit: Defined as any relief past the pharmacologic duration of the local anesthetics.  Effectiveness past the initial 6 hours after procedure (Long-Term Relief): 90 % (ongoing).  Benefits, current: Defined as benefit present at the time of this evaluation.   Analgesia:   The patient indicates having attained an ongoing 90% relief of the left shoulder pain with the diagnostic suprascapular nerve block. Function: Ms. Paget reports improvement in function ROM: Ms. Falcon reports improvement in ROM  Pharmacotherapy Assessment   Analgesic: Oxycodone IR 10 mg, 1 tab PO q 6 hrs (40 mg/day of oxycodone) MME/day: 60 mg/day.   Monitoring: [place] PMP: PDMP reviewed during this encounter.       Pharmacotherapy: No side-effects or adverse reactions reported. Compliance: No problems identified. Effectiveness: Clinically acceptable. Plan: Refer to "POC". UDS:  Summary  Date Value Ref Range Status  11/29/2020 Note  Final    Comment:    ==================================================================== [employer] (MW) ==================================================================== Test                             Result       Flag       Units  Drug Present and Declared for Prescription Verification   Oxycodone                      3039         EXPECTED   ng/mg creat   Oxymorphone                    84           EXPECTED   ng/mg creat   Noroxycodone                   >  4098        EXPECTED   ng/mg creat   Noroxymorphone                 24           EXPECTED   ng/mg creat    Sources of oxycodone are scheduled prescription medications.    Oxymorphone, noroxycodone, and noroxymorphone are expected    metabolites of oxycodone. Oxymorphone is also available as a     scheduled prescription medication.  ==================================================================== Test                      Result    Flag   Units      Ref Range   Creatinine              244              mg/dL      >=20 ==================================================================== Declared Medications:  The flagging and interpretation on this report are based on the  following declared medications.  Unexpected results may arise from  inaccuracies in the declared medications.   **Note: The testing scope of this panel includes these medications:   Oxycodone   **Note: The testing scope of this panel does not include the  following reported medications:   Albuterol  Cetirizine  Citalopram  Docusate  Eye Drop  Fluticasone (Trelegy)  Levothyroxine  Magnesium (Milk of Magnesia)  Naloxone  Tizanidine  Topical  Topical Diclofenac  Umeclidinium (Trelegy)  Vilanterol (Trelegy)  Vitamin D ==================================================================== For clinical consultation, please call [phone]. ====================================================================      Laboratory Chemistry Profile   Renal Lab Results  Component Value Date   BUN 14 04/28/2019   CREATININE 0.98 04/28/2019   BCR 13 02/16/2019   GFRAA >60 04/28/2019   GFRNONAA >60 04/28/2019    Hepatic Lab Results  Component Value Date   AST 18 04/28/2019   ALT 16 04/28/2019   ALBUMIN 3.7 04/28/2019   ALKPHOS 80 04/28/2019    Electrolytes Lab Results  Component Value Date   NA 139 04/28/2019   K 4.1 04/28/2019   CL 105 04/28/2019   CALCIUM 9.3 04/28/2019   MG 2.1 02/16/2019    Bone Lab Results  Component Value Date   25OHVITD1 27 (L) 02/16/2019   25OHVITD2 2.0 02/16/2019   25OHVITD3 25 02/16/2019    Inflammation (CRP: Acute Phase) (ESR: Chronic Phase) Lab Results  Component Value Date   CRP 2 02/16/2019   ESRSEDRATE 23 02/16/2019         Note: Above Lab  results reviewed.  Imaging  DG PAIN CLINIC C-ARM 1-60 MIN NO REPORT Fluoro was used, but no Radiologist interpretation will be provided.  Please refer to "NOTES" tab for provider progress note.  Assessment  The primary encounter diagnosis was Chronic shoulder pain (Left). Diagnoses of Acetabular labrum tear, sequela (Left), History of total shoulder replacement (Left), and Chronic pain syndrome were also pertinent to this visit.  Plan of Care  Problem-specific:  No problem-specific Assessment & Plan notes found for this encounter.  Ms. AERYN MEDICI has a current medication list which includes the following long-term medication(s): albuterol, diclofenac sodium, levothyroxine, olopatadine hcl, [START ON 03/12/2021] oxycodone hcl, [START ON 04/11/2021] oxycodone hcl, [START ON 05/11/2021] oxycodone hcl, tizanidine, and citalopram.  Pharmacotherapy (Medications Ordered): No orders of the defined types were placed in this encounter.  Orders:  No orders of the defined types were placed in this encounter.  Follow-up plan:   Return in about 3 months (around 06/10/2021) for Eval-day(M,W), (F2F), (MM).     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  Allergic to NSAIDs.  Abnormal UDS (01/02/2016). (+) Unreported tramadol.   Planned  Pending:   Pending further evaluation   Under consideration:   Possible bilateral hip joint RFA  Diagnostic caudal ESI + epidurogram  Possible Racz procedure.  Possible bilateral cervical facet RFA   Diagnostic right CESI  Diagnostic left sacroiliac joint block  Possible bilateral sacroiliac joint RFA    Completed:   Palliative bilateral trochanteric bursa x1  Palliative right suprascapular NB x3 (08/08/2020)  Palliative left suprascapular NB x4 (02/22/2021) Palliative bilateral suprascapular nerve RFA x1 (05/13/2019) Diagnostic right IA hip joint injection x3 (06/01/2020)   Diagnostic left IA hip joint injection x2 (04/30/2018)  Diagnostic bilateral lumbar facet block x3  Palliative right lumbar facet RFA x2 (12/16/2017)  Palliative left lumbar facet RFA x2 (02/12/2018)  Diagnostic bilateral cervical facet block x1  Diagnostic right SI joint block x1    Therapeutic  Palliative (PRN) options:   Palliative bilateral trochanteric bursa #2  Palliative bilateral suprascapular nerve block #4  Palliative bilateral suprascapular nerve RFA #2  Diagnostic right IA hip joint injection #4  Diagnostic left IA hip joint injection #3  Diagnostic bilateral lumbar facet block #4  Palliative right lumbar facet RFA #3  Palliative left lumbar facet RFA #3  Diagnostic bilateral cervical facet block #2  Diagnostic right SI joint block #2           Recent Visits Date Type Provider Dept  02/22/21 Procedure visit Milinda Pointer, MD [facility]  02/07/21 Telemedicine Milinda Pointer, MD [facility]  01/25/21 Procedure visit Milinda Pointer, MD [facility]  Showing recent visits within past 90 days and meeting all other requirements Today's Visits Date Type Provider Dept  03/07/21 Telemedicine Milinda Pointer, MD [facility]  Showing today's visits and meeting all other requirements Future Appointments No visits were found meeting these conditions. Showing future appointments within next 90 days and meeting all other requirements I discussed the assessment and treatment plan with the patient. The patient was provided an opportunity to ask questions and all were answered. The patient agreed with the plan and demonstrated an understanding of the instructions.  Patient advised to call back or seek an in-person evaluation if the symptoms or condition worsens.  Duration of encounter: 12 minutes.  Note by: Gaspar Cola, MD Date: 03/07/2021; Time: 2:11 PM

## 2021-03-07 ENCOUNTER — Other Ambulatory Visit: Payer: Self-pay

## 2021-03-07 ENCOUNTER — Ambulatory Visit: Payer: Medicaid Other | Attending: Pain Medicine | Admitting: Pain Medicine

## 2021-03-07 DIAGNOSIS — Z96612 Presence of left artificial shoulder joint: Secondary | ICD-10-CM | POA: Diagnosis not present

## 2021-03-07 DIAGNOSIS — S73192S Other sprain of left hip, sequela: Secondary | ICD-10-CM | POA: Diagnosis not present

## 2021-03-07 DIAGNOSIS — G894 Chronic pain syndrome: Secondary | ICD-10-CM | POA: Diagnosis not present

## 2021-03-07 DIAGNOSIS — G8929 Other chronic pain: Secondary | ICD-10-CM

## 2021-03-07 DIAGNOSIS — M25512 Pain in left shoulder: Secondary | ICD-10-CM | POA: Diagnosis not present

## 2021-03-12 ENCOUNTER — Encounter: Payer: Medicaid Other | Admitting: Pain Medicine

## 2021-04-10 DIAGNOSIS — J441 Chronic obstructive pulmonary disease with (acute) exacerbation: Secondary | ICD-10-CM | POA: Insufficient documentation

## 2021-05-10 ENCOUNTER — Other Ambulatory Visit: Payer: Self-pay | Admitting: Pain Medicine

## 2021-05-10 DIAGNOSIS — M62830 Muscle spasm of back: Secondary | ICD-10-CM

## 2021-05-10 DIAGNOSIS — G8929 Other chronic pain: Secondary | ICD-10-CM

## 2021-06-03 NOTE — Progress Notes (Signed)
PROVIDER NOTE: Information contained herein reflects review and annotations entered in association with encounter. Interpretation of such information and data should be left to medically-trained personnel. Information provided to patient can be located elsewhere in the medical record under "Patient Instructions". Document created using STT-dictation technology, any transcriptional errors that may result from process are unintentional.    Patient: Danielle [name]  Service Category: E/M  Provider: Gaspar Cola, MD  DOB: 1960-10-28  DOS: 06/04/2021  Specialty: Interventional Pain Management  MRN: [id number]  Setting: Ambulatory outpatient  PCP: Danae Orleans, MD  Type: Established Patient    Referring Provider: Danae Orleans, MD  Location: Office  Delivery: Face-to-face     HPI  Ms. EVON DEJARNETT, a 60 y.o. year old female, is here today because of her Chronic neck pain [M54.2, G89.29]. Ms. Gerhart primary complain today is Shoulder Pain Last encounter: My last encounter with her was on 05/10/2021. Pertinent problems: Ms. Frew has Cervical spinal cord compression [facility]) (April, 2017); Cervical spinal stenosis; Chronic shoulder pain (1ry area of Pain) (Bilateral) (R>L); Chronic low back pain (3ry area of Pain) (Bilateral) (R>L) w/ sciatica (Right); Lumbar facet syndrome (Bilateral) (R>L); Lumbar spondylosis; Failed back surgical syndrome; Chronic lower extremity pain (Right); Chronic neck pain (Bilateral) (R>L); Chronic cervical radicular pain (Right); Ulnar neuropathy (Left); Hx of cervical spine surgery; Cervical spondylosis; Fibromyalgia; Chronic sacroiliac pain (Bilateral) (L>R); History of total arthroplasty of shoulder (Left); Chronic pain syndrome; Complaints of weakness of lower extremity; Anterolisthesis of lumbar spine (L4/L5); Cervical facet syndrome ([place]); Chronic musculoskeletal pain; Muscle spasm of back; Myofascial pain; Chronic hip pain (2ry area of Pain) (Bilateral) (R>L);  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); Trigger point with back pain; Osteoarthritis of shoulder (Bilateral); Spondylosis without myelopathy or radiculopathy, lumbosacral region; Cellulitis of leg, right; Chronic trochanteric bursitis (Bilateral); Osteoarthritis involving multiple joints; Chronic shoulder pain after replacement; History of total shoulder replacement (Right); History of total replacement of shoulder joints (Bilateral); Leg edema, left; Cervical spondylosis with myelopathy and radiculopathy; Chronic hip pain (Right); Osteoarthritis of hip (Right); Chronic sacroiliac joint pain (Left); Other spondylosis, sacral and sacrococcygeal region; Sacroiliac joint dysfunction (Left); Somatic dysfunction of sacroiliac joint (Left); Enthesopathy of sacroiliac joint (Left); DDD (degenerative disc disease), cervical; DDD (degenerative disc disease), lumbosacral; Other intervertebral disc degeneration, lumbar region; Acetabular labrum tear, sequela (Left); Acetabular labrum tear, sequela (Right); Greater trochanteric bursitis of hips (Bilateral); Epidural fibrosis (Left: L5-S1); Chronic groin pain (Bilateral); Abnormal MRI, lumbar spine (09/28/2020); Abnormal MRI, hip (Bilateral) (10/04/2020); and Chronic shoulder pain (Left) on their pertinent problem list. Pain Assessment: Severity of Chronic pain is reported as a 6 /10. Location: Shoulder Right, Left, Anterior/Right shoulder pain radiates from upper shoulder uptowards right side of neck upwards behind right ear. Onset: More than a month ago. Quality: Constant, Sharp, Penetrating. Timing: Constant. Modifying factor(s): Oxycodone helps only some, wet hot towel on shoulder. Vitals:  height is '5\' 5"'  (1.651 m) and weight is 230 lb (104.3 kg). Her temporal temperature is 97.5 F (36.4 C) (abnormal). Her blood pressure is 98/63 and her pulse is 83. Her respiration is 18 and oxygen saturation is 97%.   Reason for encounter: medication management.   The patient  indicates doing well with the current medication regimen. No adverse reactions or side effects reported to the medications.   RTCB: 09/08/2021 Nonopioids transferred 05/15/2020: Zanaflex and Voltaren gel  Pharmacotherapy Assessment  Analgesic: Oxycodone IR 10 mg, 1 tab PO q 6 hrs (40 mg/day of oxycodone) MME/day: 60 mg/day.  Monitoring: [place] PMP: PDMP reviewed during this encounter.       Pharmacotherapy: No side-effects or adverse reactions reported. Compliance: No problems identified. Effectiveness: Clinically acceptable.  Al Decant, RN  06/04/2021  1:18 PM  Sign when Signing Visit Safety precautions to be maintained throughout the outpatient stay will include: orient to surroundings, keep bed in low position, maintain call bell within reach at all times, provide assistance with transfer out of bed and ambulation.   Nursing Pain Medication Assessment:  Safety precautions to be maintained throughout the outpatient stay will include: orient to surroundings, keep bed in low position, maintain call bell within reach at all times, provide assistance with transfer out of bed and ambulation.  Medication Inspection Compliance: Pill count conducted under aseptic conditions, in front of the patient. Neither the pills nor the bottle was removed from the patient's sight at any time. Once count was completed pills were immediately returned to the patient in their original bottle.  Medication:  Oxycodone 29m Pill/Patch Count:  33 of 120 pills remain Pill/Patch Appearance: Markings consistent with prescribed medication Bottle Appearance: Standard pharmacy container. Clearly labeled. Filled Date: 113/ 28 / 2022 Last Medication intake:  Today       UDS:  Summary  Date Value Ref Range Status  11/29/2020 Note  Final    Comment:    ==================================================================== [employer]  (MW) ==================================================================== Test                             Result       Flag       Units  Drug Present and Declared for Prescription Verification   Oxycodone                      3039         EXPECTED   ng/mg creat   Oxymorphone                    84           EXPECTED   ng/mg creat   Noroxycodone                   >4098        EXPECTED   ng/mg creat   Noroxymorphone                 24           EXPECTED   ng/mg creat    Sources of oxycodone are scheduled prescription medications.    Oxymorphone, noroxycodone, and noroxymorphone are expected    metabolites of oxycodone. Oxymorphone is also available as a    scheduled prescription medication.  ==================================================================== Test                      Result    Flag   Units      Ref Range   Creatinine              244              mg/dL      >=20 ==================================================================== Declared Medications:  The flagging and interpretation on this report are based on the  following declared medications.  Unexpected results may arise from  inaccuracies in the declared medications.   **Note: The testing scope of this panel includes these medications:   Oxycodone   **Note: The testing  scope of this panel does not include the  following reported medications:   Albuterol  Cetirizine  Citalopram  Docusate  Eye Drop  Fluticasone (Trelegy)  Levothyroxine  Magnesium (Milk of Magnesia)  Naloxone  Tizanidine  Topical  Topical Diclofenac  Umeclidinium (Trelegy)  Vilanterol (Trelegy)  Vitamin D ==================================================================== For clinical consultation, please call [phone]. ====================================================================      ROS  Constitutional: Denies any fever or chills Gastrointestinal: No reported hemesis, hematochezia, vomiting, or acute GI  distress Musculoskeletal: Denies any acute onset joint swelling, redness, loss of ROM, or weakness Neurological: No reported episodes of acute onset apraxia, aphasia, dysarthria, agnosia, amnesia, paralysis, loss of coordination, or loss of consciousness  Medication Review  Fluticasone-Umeclidin-Vilant, Olopatadine HCl, Oxycodone HCl, albuterol, cetirizine, cholecalciferol, citalopram, diclofenac Sodium, docusate sodium, levothyroxine, magnesium hydroxide, naloxone, nystatin, predniSONE, and tiZANidine  History Review  Allergy: Ms. Belitz is allergic to aripiprazole, duloxetine, gabapentin, gold, iodinated diagnostic agents, naproxen sodium, nortriptyline hcl, nsaids, pregabalin, meperidine, ace inhibitors, aspirin, cefpodoxime, fluoxetine, fluticasone-salmeterol, levofloxacin, lithium, paroxetine, tape, telithromycin, theophylline, topiramate, trazodone, triamcinolone, venlafaxine, bupropion, ketorolac tromethamine, morphine, and moxifloxacin. Drug: Ms. Avery  reports no history of drug use. Alcohol:  reports no history of alcohol use. Tobacco:  reports that she has been smoking cigarettes. She has a 80.00 pack-year smoking history. She has never used smokeless tobacco. Social: Ms. Zertuche  reports that she has been smoking cigarettes. She has a 80.00 pack-year smoking history. She has never used smokeless tobacco. She reports that she does not drink alcohol and does not use drugs. Medical:  has a past medical history of Acute postoperative pain (01/13/2017), Anxiety, Arthritis, Asthma, Bell's palsy, Bursitis, COPD (chronic obstructive pulmonary disease) ([place]), Depression, Fibromyalgia, Heart murmur, Hepatitis C, Hiatal hernia, Hyperlipidemia, Hypertension, IBS (irritable bowel syndrome), Insomnia, and Thyroid disease. Surgical: Ms. Prim  has a past surgical history that includes necksurgery (10/21/2014); Back surgery (2013); Foot surgery (Right); Elbow surgery (Left); Carpal tunnel release  (Right); Nose surgery; Partial hysterectomy; Total shoulder replacement (Bilateral); and Colonoscopy with propofol (N/A, 02/05/2017). Family: family history includes Cancer in her mother; Gout in her mother; Heart disease in her father.  Laboratory Chemistry Profile   Renal Lab Results  Component Value Date   BUN 14 04/28/2019   CREATININE 0.98 04/28/2019   BCR 13 02/16/2019   GFRAA >60 04/28/2019   GFRNONAA >60 04/28/2019    Hepatic Lab Results  Component Value Date   AST 18 04/28/2019   ALT 16 04/28/2019   ALBUMIN 3.7 04/28/2019   ALKPHOS 80 04/28/2019    Electrolytes Lab Results  Component Value Date   NA 139 04/28/2019   K 4.1 04/28/2019   CL 105 04/28/2019   CALCIUM 9.3 04/28/2019   MG 2.1 02/16/2019    Bone Lab Results  Component Value Date   25OHVITD1 27 (L) 02/16/2019   25OHVITD2 2.0 02/16/2019   25OHVITD3 25 02/16/2019    Inflammation (CRP: Acute Phase) (ESR: Chronic Phase) Lab Results  Component Value Date   CRP 2 02/16/2019   ESRSEDRATE 23 02/16/2019         Note: Above Lab results reviewed.  Recent Imaging Review  DG PAIN CLINIC C-ARM 1-60 MIN NO REPORT Fluoro was used, but no Radiologist interpretation will be provided.  Please refer to "NOTES" tab for provider progress note. Note: Reviewed        Physical Exam  General appearance: Well nourished, well developed, and well hydrated. In no apparent acute distress Mental status: Alert, oriented x 3 (person,  place, & time)       Respiratory: No evidence of acute respiratory distress Eyes: PERLA Vitals: BP 98/63   Pulse 83   Temp (!) 97.5 F (36.4 C) (Temporal)   Resp 18   Ht '5\' 5"'  (1.651 m)   Wt 230 lb (104.3 kg)   SpO2 97%   BMI 38.27 kg/m  BMI: Estimated body mass index is 38.27 kg/m as calculated from the following:   Height as of this encounter: '5\' 5"'  (1.651 m).   Weight as of this encounter: 230 lb (104.3 kg). Ideal: Ideal body weight: 57 kg (125 lb 10.6 oz) Adjusted ideal body  weight: 75.9 kg (167 lb 6.4 oz)  Assessment   Status Diagnosis  Controlled Controlled Controlled 1. Chronic neck pain (Bilateral) (R>L)   2. Cervical facet syndrome (HCC)   3. Chronic shoulder pain (1ry area of Pain) (Bilateral) (R>L)   4. Chronic pain syndrome   5. Pharmacologic therapy   6. Chronic use of opiate for therapeutic purpose   7. Muscle spasm of back   8. Chronic musculoskeletal pain      Updated Problems: Problem  Copd Exacerbation (Hcc)   Last Assessment & Plan:  Formatting of this note might be different from the original. Productive cough, runny nose, wheezing X 4 days. Hx of COPD/bronchitis. Recommend prednisone 43m daily X 5 days and doxycycline 1017mBID X 7 days. Rx sent in or tussionex per patient request. Take a COVID test and follow up right away if positive.  Given urgent care ER precautions for persisting symptoms or worsening. Patient verbalizes understanding and has no further questions at this time.     Plan of Care  Problem-specific:  No problem-specific Assessment & Plan notes found for this encounter.  Ms. AnTEYONA NICHELSONas a current medication list which includes the following long-term medication(s): albuterol, citalopram, diclofenac sodium, levothyroxine, olopatadine hcl, [START ON 06/10/2021] oxycodone hcl, [START ON 07/10/2021] oxycodone hcl, [START ON 08/09/2021] oxycodone hcl, and tizanidine.  Pharmacotherapy (Medications Ordered): Meds ordered this encounter  Medications   Oxycodone HCl 10 MG TABS    Sig: Take 1 tablet (10 mg total) by mouth every 6 (six) hours as needed. Must last 30 days    Dispense:  120 tablet    Refill:  0    DO NOT: delete (not duplicate); no partial-fill (will deny script to complete), no refill request (F/U required). DISPENSE: 1 day early if closed on fill date. WARN: No CNS-depressants within 8 hrs of med.   Oxycodone HCl 10 MG TABS    Sig: Take 1 tablet (10 mg total) by mouth every 6 (six) hours as  needed. Must last 30 days    Dispense:  120 tablet    Refill:  0    DO NOT: delete (not duplicate); no partial-fill (will deny script to complete), no refill request (F/U required). DISPENSE: 1 day early if closed on fill date. WARN: No CNS-depressants within 8 hrs of med.   Oxycodone HCl 10 MG TABS    Sig: Take 1 tablet (10 mg total) by mouth every 6 (six) hours as needed. Must last 30 days    Dispense:  120 tablet    Refill:  0    DO NOT: delete (not duplicate); no partial-fill (will deny script to complete), no refill request (F/U required). DISPENSE: 1 day early if closed on fill date. WARN: No CNS-depressants within 8 hrs of med.   tiZANidine (ZANAFLEX) 4 MG tablet  Sig: Take 1 tablet (4 mg total) by mouth 3 (three) times daily. Max: 3/day    Dispense:  90 tablet    Refill:  0    Fill one day early if pharmacy is closed on scheduled refill date. May substitute for generic, or similar, if available. Void any older refills or prescriptions of this medication.   predniSONE (DELTASONE) 20 MG tablet    Sig: Take 3 tablets (60 mg total) by mouth daily with breakfast for 3 days, THEN 2 tablets (40 mg total) daily with breakfast for 3 days, THEN 1 tablet (20 mg total) daily with breakfast for 3 days.    Dispense:  18 tablet    Refill:  0   naloxone (NARCAN) nasal spray 4 mg/0.1 mL    Sig: Place 1 spray into the nose as needed for up to 365 doses (for opioid-induced respiratory depresssion). In case of emergency (overdose), spray once into each nostril. If no response within 3 minutes, repeat application and call [phone].    Dispense:  1 each    Refill:  0    Instruct patient in proper use of device.    Orders:  Orders Placed This Encounter  Procedures   CERVICAL FACET (MEDIAL BRANCH NERVE BLOCK)     Standing Status:   Future    Standing Expiration Date:   09/04/2021    Scheduling Instructions:     Side: Right-sided     Level: C3-4, C4-5, C5-6 Facet joints (C3, C4, C5, C6, & C7 Medial  Branch Nerves)     Sedation: Patient's choice.     Timeframe: As soon as schedule allows    Order Specific Question:   Where will this procedure be performed?    Answer:   [facility]    Follow-up plan:   Return in about 2 weeks (around 06/18/2021) for (Clinic) procedure: (R) C-FCT Blk.     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  Allergic to NSAIDs.  Abnormal UDS (01/02/2016). (+) Unreported tramadol.   Planned  Pending:   Pending further evaluation   Under consideration:   Possible bilateral hip joint RFA  Diagnostic caudal ESI + epidurogram  Possible Racz procedure.  Possible bilateral cervical facet RFA   Diagnostic right CESI  Diagnostic left sacroiliac joint block  Possible bilateral sacroiliac joint RFA    Completed:   Palliative bilateral trochanteric bursa x1  Palliative right suprascapular NB x3 (08/08/2020)  Palliative left suprascapular NB x4 (02/22/2021) Palliative bilateral suprascapular nerve RFA x1 (05/13/2019) Diagnostic right IA hip joint injection x3 (06/01/2020)  Diagnostic left IA hip joint injection x2 (04/30/2018)  Diagnostic bilateral lumbar facet block x3  Palliative right lumbar facet RFA x2 (12/16/2017)  Palliative left lumbar facet RFA x2 (02/12/2018)  Diagnostic bilateral cervical facet block x1  Diagnostic right SI joint block x1    Therapeutic  Palliative (PRN) options:   Palliative bilateral trochanteric bursa #2  Palliative bilateral suprascapular nerve block #4  Palliative bilateral suprascapular nerve RFA #2  Diagnostic right IA hip joint injection #4  Diagnostic left IA hip joint injection #3  Diagnostic bilateral lumbar facet block #4  Palliative right lumbar facet RFA #3  Palliative left lumbar facet RFA #3  Diagnostic bilateral cervical facet block #2  Diagnostic right SI joint block #2     Recent Visits Date Type Provider Dept  03/07/21  Telemedicine Milinda Pointer, MD [facility]  Showing recent visits within  past 90 days and meeting all other requirements Today's Visits Date Type Provider Dept  06/04/21 Office Visit Milinda Pointer, MD [facility]  Showing today's visits and meeting all other requirements Future Appointments No visits were found meeting these conditions. Showing future appointments within next 90 days and meeting all other requirements I discussed the assessment and treatment plan with the patient. The patient was provided an opportunity to ask questions and all were answered. The patient agreed with the plan and demonstrated an understanding of the instructions.  Patient advised to call back or seek an in-person evaluation if the symptoms or condition worsens.  Duration of encounter: 30 minutes.  Note by: Gaspar Cola, MD Date: 06/04/2021; Time: 1:46 PM

## 2021-06-04 ENCOUNTER — Other Ambulatory Visit: Payer: Self-pay

## 2021-06-04 ENCOUNTER — Encounter: Payer: Self-pay | Admitting: Pain Medicine

## 2021-06-04 ENCOUNTER — Ambulatory Visit: Payer: Medicaid Other | Attending: Pain Medicine | Admitting: Pain Medicine

## 2021-06-04 VITALS — BP 98/63 | HR 83 | Temp 97.5°F | Resp 18 | Ht 65.0 in | Wt 230.0 lb

## 2021-06-04 DIAGNOSIS — G8929 Other chronic pain: Secondary | ICD-10-CM | POA: Insufficient documentation

## 2021-06-04 DIAGNOSIS — M542 Cervicalgia: Secondary | ICD-10-CM | POA: Diagnosis not present

## 2021-06-04 DIAGNOSIS — M62830 Muscle spasm of back: Secondary | ICD-10-CM | POA: Insufficient documentation

## 2021-06-04 DIAGNOSIS — M7918 Myalgia, other site: Secondary | ICD-10-CM | POA: Diagnosis present

## 2021-06-04 DIAGNOSIS — M25511 Pain in right shoulder: Secondary | ICD-10-CM | POA: Diagnosis not present

## 2021-06-04 DIAGNOSIS — Z79891 Long term (current) use of opiate analgesic: Secondary | ICD-10-CM | POA: Diagnosis present

## 2021-06-04 DIAGNOSIS — M47812 Spondylosis without myelopathy or radiculopathy, cervical region: Secondary | ICD-10-CM | POA: Insufficient documentation

## 2021-06-04 DIAGNOSIS — Z79899 Other long term (current) drug therapy: Secondary | ICD-10-CM | POA: Diagnosis present

## 2021-06-04 DIAGNOSIS — M25512 Pain in left shoulder: Secondary | ICD-10-CM | POA: Diagnosis present

## 2021-06-04 DIAGNOSIS — G894 Chronic pain syndrome: Secondary | ICD-10-CM | POA: Diagnosis not present

## 2021-06-04 MED ORDER — PREDNISONE 20 MG PO TABS: ORAL_TABLET | ORAL | 0 refills | Status: AC

## 2021-06-04 MED ORDER — OXYCODONE HCL 10 MG PO TABS: 10.0000 mg | ORAL_TABLET | Freq: Four times a day (QID) | ORAL | 0 refills | Status: DC | PRN

## 2021-06-04 MED ORDER — NALOXONE HCL 4 MG/0.1ML NA LIQD: 1.0000 | NASAL | 0 refills | Status: AC | PRN

## 2021-06-04 MED ORDER — TIZANIDINE HCL 4 MG PO TABS: 4.0000 mg | ORAL_TABLET | Freq: Three times a day (TID) | ORAL | 0 refills | Status: DC

## 2021-06-04 NOTE — Progress Notes (Signed)
Safety precautions to be maintained throughout the outpatient stay will include: orient to surroundings, keep bed in low position, maintain call bell within reach at all times, provide assistance with transfer out of bed and ambulation.   Nursing Pain Medication Assessment:  Safety precautions to be maintained throughout the outpatient stay will include: orient to surroundings, keep bed in low position, maintain call bell within reach at all times, provide assistance with transfer out of bed and ambulation.  Medication Inspection Compliance: Pill count conducted under aseptic conditions, in front of the patient. Neither the pills nor the bottle was removed from the patient's sight at any time. Once count was completed pills were immediately returned to the patient in their original bottle.  Medication:  Oxycodone 10mg  Pill/Patch Count:  33 of 120 pills remain Pill/Patch Appearance: Markings consistent with prescribed medication Bottle Appearance: Standard pharmacy container. Clearly labeled. Filled Date: 42 / 28 / 2022 Last Medication intake:  Today

## 2021-06-12 ENCOUNTER — Telehealth: Payer: Self-pay

## 2021-06-12 NOTE — Telephone Encounter (Signed)
Insurance denied auth for cervical facets

## 2021-06-18 ENCOUNTER — Telehealth: Payer: Self-pay

## 2021-06-18 NOTE — Telephone Encounter (Signed)
Patient coming tomorrow around 9 for Toradol/Norflex. Please add her to Dr. Adalberto Cole schedule tomorrow.

## 2021-06-18 NOTE — Telephone Encounter (Signed)
Insurance denied her cesi and she wants to know if there is anything she can do because she is in a lot of pain. Also states that its almost time for her medications and she will need prior auth so she wants someone to get on that. Please call her with suggestions for her pain. She went to the ER and they gave her a toredol shot.

## 2021-06-19 ENCOUNTER — Encounter: Payer: Self-pay | Admitting: *Deleted

## 2021-06-19 ENCOUNTER — Other Ambulatory Visit: Payer: Self-pay

## 2021-06-19 ENCOUNTER — Encounter: Payer: Self-pay | Admitting: Pain Medicine

## 2021-06-19 ENCOUNTER — Ambulatory Visit: Payer: Medicaid Other | Attending: Pain Medicine | Admitting: Pain Medicine

## 2021-06-19 VITALS — BP 144/104 | HR 88 | Temp 97.5°F | Resp 18 | Ht 65.0 in | Wt 230.0 lb

## 2021-06-19 DIAGNOSIS — M542 Cervicalgia: Secondary | ICD-10-CM | POA: Insufficient documentation

## 2021-06-19 DIAGNOSIS — M5030 Other cervical disc degeneration, unspecified cervical region: Secondary | ICD-10-CM | POA: Diagnosis present

## 2021-06-19 DIAGNOSIS — Z9889 Other specified postprocedural states: Secondary | ICD-10-CM | POA: Insufficient documentation

## 2021-06-19 DIAGNOSIS — M47812 Spondylosis without myelopathy or radiculopathy, cervical region: Secondary | ICD-10-CM | POA: Diagnosis present

## 2021-06-19 DIAGNOSIS — G8929 Other chronic pain: Secondary | ICD-10-CM | POA: Insufficient documentation

## 2021-06-19 DIAGNOSIS — G8928 Other chronic postprocedural pain: Secondary | ICD-10-CM | POA: Insufficient documentation

## 2021-06-19 MED ORDER — KETOROLAC TROMETHAMINE 60 MG/2ML IM SOLN
60.0000 mg | Freq: Once | INTRAMUSCULAR | Status: AC
  Administered 2021-06-19: 60 mg via INTRAMUSCULAR

## 2021-06-19 MED ORDER — ORPHENADRINE CITRATE 30 MG/ML IJ SOLN
60.0000 mg | Freq: Once | INTRAMUSCULAR | Status: AC
  Administered 2021-06-19: 60 mg via INTRAMUSCULAR

## 2021-06-19 MED ORDER — ORPHENADRINE CITRATE 30 MG/ML IJ SOLN
INTRAMUSCULAR | Status: AC
  Filled 2021-06-19: qty 2

## 2021-06-19 MED ORDER — KETOROLAC TROMETHAMINE 60 MG/2ML IM SOLN
INTRAMUSCULAR | Status: AC
  Filled 2021-06-19: qty 2

## 2021-06-19 NOTE — Patient Instructions (Signed)

## 2021-06-19 NOTE — Progress Notes (Signed)
PROVIDER NOTE: Information contained herein reflects review and annotations entered in association with encounter. Interpretation of such information and data should be left to medically-trained personnel. Information provided to patient can be located elsewhere in the medical record under "Patient Instructions". Document created using STT-dictation technology, any transcriptional errors that may result from process are unintentional.    Patient: Danielle [name]  Service Category: E/M  Provider: Gaspar Cola, MD  DOB: 1960-11-12  DOS: 06/19/2021  Specialty: Interventional Pain Management  MRN: [id number]  Setting: Ambulatory outpatient  PCP: Danae Orleans, MD  Type: Established Patient    Referring Provider: Danae Orleans, MD  Location: Office  Delivery: Face-to-face     HPI  Danielle [name], a 60 y.o. year old female, is here today because of her Cervicalgia [M54.2]. Danielle [name] primary complain today is Neck Pain (Right side) Last encounter: My last encounter with her was on 06/04/2021. Pertinent problems: Danielle [name] has Cervical spinal cord compression [facility]) (April, 2017); Cervical spinal stenosis; Chronic shoulder pain (1ry area of Pain) (Bilateral) (R>L); Chronic low back pain (3ry area of Pain) (Bilateral) (R>L) w/ sciatica (Right); Lumbar facet syndrome (Bilateral) (R>L); Lumbar spondylosis; Failed back surgical syndrome; Chronic lower extremity pain (Right); Chronic neck pain (Bilateral) (R>L); Chronic cervical radicular pain (Right); Ulnar neuropathy (Left); Hx of cervical spine surgery; Cervical spondylosis; Fibromyalgia; Chronic sacroiliac pain (Bilateral) (L>R); History of total arthroplasty of shoulder (Left); Chronic pain syndrome; Complaints of weakness of lower extremity; Anterolisthesis of lumbar spine (L4/L5); Cervical facet syndrome ([place]); Chronic musculoskeletal pain; Muscle spasm of back; Myofascial pain; Chronic hip pain (2ry area of Pain) (Bilateral) (R>L);  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); Trigger point with back pain; Osteoarthritis of shoulder (Bilateral); Spondylosis without myelopathy or radiculopathy, lumbosacral region; Cellulitis of leg, right; Chronic trochanteric bursitis (Bilateral); Osteoarthritis involving multiple joints; Chronic shoulder pain after replacement; History of total shoulder replacement (Right); History of total replacement of shoulder joints (Bilateral); Leg edema, left; Cervical spondylosis with myelopathy and radiculopathy; Chronic hip pain (Right); Osteoarthritis of hip (Right); Chronic sacroiliac joint pain (Left); Other spondylosis, sacral and sacrococcygeal region; Sacroiliac joint dysfunction (Left); Somatic dysfunction of sacroiliac joint (Left); Enthesopathy of sacroiliac joint (Left); DDD (degenerative disc disease), cervical; DDD (degenerative disc disease), lumbosacral; Other intervertebral disc degeneration, lumbar region; Acetabular labrum tear, sequela (Left); Acetabular labrum tear, sequela (Right); Greater trochanteric bursitis of hips (Bilateral); Epidural fibrosis (Left: L5-S1); Chronic groin pain (Bilateral); Abnormal MRI, lumbar spine (09/28/2020); Abnormal MRI, hip (Bilateral) (10/04/2020); Chronic shoulder pain (Left); Cervicalgia; and Chronic neck pain with history of cervical spinal surgery on their pertinent problem list. Pain Assessment: Severity of Chronic pain is reported as a 4 /10. Location: Neck Right/radiates up into right side of head and down right side to scapula. Onset: More than a month ago. Quality: Aching, Sharp, Shooting, Stabbing, Throbbing, Other (Comment) (intense). Timing: Constant. Modifying factor(s): nothing. Vitals:  height is $RemoveB'5\' 5"'fpEntIdm$  (1.651 m) and weight is 230 lb (104.3 kg). Her temperature is 97.5 F (36.4 C) (abnormal). Her blood pressure is 144/104 (abnormal) and her pulse is 88. Her respiration is 18 and oxygen saturation is 99%.   Reason for encounter: worsening of previously  known (established) problem.  The patient has been experiencing more pain in the cervical region for which we ordered a diagnostic/therapeutic right-sided cervical facet block.  It is my professional opinion as a board-certified pain management specialist that the patient needs this treatment.  However, the patient's insurance company has denied treatment.  Obviously this  has not solved the problem but made it worse and therefore today the patient is coming in for a Toradol/Norflex injection to see if we can break her cycle of pain.  I will also be referring her to do physical therapy and I will be updating her 2018 cervical spine x-rays.  In addition, I will be getting a CT scan of the area to further document the problem for the insurance company.  Pharmacotherapy Assessment  Analgesic: Oxycodone IR 10 mg, 1 tab PO q 6 hrs (40 mg/day of oxycodone) MME/day: 60 mg/day.   Monitoring: [place] PMP: PDMP reviewed during this encounter.       Pharmacotherapy: No side-effects or adverse reactions reported. Compliance: No problems identified. Effectiveness: Clinically acceptable.  No notes on file  UDS:  Summary  Date Value Ref Range Status  11/29/2020 Note  Final    Comment:    ==================================================================== [employer] Select 13 (MW) ==================================================================== Test                             Result       Flag       Units  Drug Present and Declared for Prescription Verification   Oxycodone                      3039         EXPECTED   ng/mg creat   Oxymorphone                    84           EXPECTED   ng/mg creat   Noroxycodone                   >4098        EXPECTED   ng/mg creat   Noroxymorphone                 24           EXPECTED   ng/mg creat    Sources of oxycodone are scheduled prescription medications.    Oxymorphone, noroxycodone, and noroxymorphone are expected    metabolites of oxycodone. Oxymorphone is also  available as a    scheduled prescription medication.  ==================================================================== Test                      Result    Flag   Units      Ref Range   Creatinine              244              mg/dL      >=20 ==================================================================== Declared Medications:  The flagging and interpretation on this report are based on the  following declared medications.  Unexpected results may arise from  inaccuracies in the declared medications.   **Note: The testing scope of this panel includes these medications:   Oxycodone   **Note: The testing scope of this panel does not include the  following reported medications:   Albuterol  Cetirizine  Citalopram  Docusate  Eye Drop  Fluticasone (Trelegy)  Levothyroxine  Magnesium (Milk of Magnesia)  Naloxone  Tizanidine  Topical  Topical Diclofenac  Umeclidinium (Trelegy)  Vilanterol (Trelegy)  Vitamin D ==================================================================== For clinical consultation, please call [phone]. ====================================================================      ROS  Constitutional: Denies any fever or chills Gastrointestinal: No reported hemesis, hematochezia,  vomiting, or acute GI distress Musculoskeletal: Denies any acute onset joint swelling, redness, loss of ROM, or weakness Neurological: No reported episodes of acute onset apraxia, aphasia, dysarthria, agnosia, amnesia, paralysis, loss of coordination, or loss of consciousness  Medication Review  Fluticasone-Umeclidin-Vilant, Olopatadine HCl, Oxycodone HCl, albuterol, cetirizine, cholecalciferol, citalopram, diclofenac Sodium, docusate sodium, levothyroxine, lidocaine, magnesium hydroxide, naloxone, nystatin, and tiZANidine  History Review  Allergy: Ms. Harvie is allergic to aripiprazole, duloxetine, gabapentin, gold, iodinated diagnostic agents, naproxen sodium,  nortriptyline hcl, nsaids, pregabalin, meperidine, ace inhibitors, aspirin, cefpodoxime, fluoxetine, fluticasone-salmeterol, levofloxacin, lithium, paroxetine, tape, telithromycin, theophylline, topiramate, trazodone, triamcinolone, venlafaxine, bupropion, ketorolac tromethamine, morphine, and moxifloxacin. Drug: Ms. Hanssen  reports no history of drug use. Alcohol:  reports no history of alcohol use. Tobacco:  reports that she has been smoking cigarettes. She has a 80.00 pack-year smoking history. She has never used smokeless tobacco. Social: Ms. Horvath  reports that she has been smoking cigarettes. She has a 80.00 pack-year smoking history. She has never used smokeless tobacco. She reports that she does not drink alcohol and does not use drugs. Medical:  has a past medical history of Acute postoperative pain (01/13/2017), Anxiety, Arthritis, Asthma, Bell's palsy, Bursitis, COPD (chronic obstructive pulmonary disease) ([place]), Depression, Fibromyalgia, Heart murmur, Hepatitis C, Hiatal hernia, Hyperlipidemia, Hypertension, IBS (irritable bowel syndrome), Insomnia, and Thyroid disease. Surgical: Ms. Omura  has a past surgical history that includes necksurgery (10/21/2014); Back surgery (2013); Foot surgery (Right); Elbow surgery (Left); Carpal tunnel release (Right); Nose surgery; Partial hysterectomy; Total shoulder replacement (Bilateral); and Colonoscopy with propofol (N/A, 02/05/2017). Family: family history includes Cancer in her mother; Gout in her mother; Heart disease in her father.  Laboratory Chemistry Profile   Renal Lab Results  Component Value Date   BUN 14 04/28/2019   CREATININE 0.98 04/28/2019   BCR 13 02/16/2019   GFRAA >60 04/28/2019   GFRNONAA >60 04/28/2019    Hepatic Lab Results  Component Value Date   AST 18 04/28/2019   ALT 16 04/28/2019   ALBUMIN 3.7 04/28/2019   ALKPHOS 80 04/28/2019    Electrolytes Lab Results  Component Value Date   NA 139 04/28/2019   K 4.1  04/28/2019   CL 105 04/28/2019   CALCIUM 9.3 04/28/2019   MG 2.1 02/16/2019    Bone Lab Results  Component Value Date   25OHVITD1 27 (L) 02/16/2019   25OHVITD2 2.0 02/16/2019   25OHVITD3 25 02/16/2019    Inflammation (CRP: Acute Phase) (ESR: Chronic Phase) Lab Results  Component Value Date   CRP 2 02/16/2019   ESRSEDRATE 23 02/16/2019         Note: Above Lab results reviewed.  Recent Imaging Review  DG PAIN CLINIC C-ARM 1-60 MIN NO REPORT Fluoro was used, but no Radiologist interpretation will be provided.  Please refer to "NOTES" tab for provider progress note. Note: Reviewed        Physical Exam  General appearance: Well nourished, well developed, and well hydrated. In no apparent acute distress Mental status: Alert, oriented x 3 (person, place, & time)       Respiratory: No evidence of acute respiratory distress Eyes: PERLA Vitals: BP (!) 144/104 (BP Location: Left Arm, Patient Position: Sitting, Cuff Size: Large) Comment: dr [name] notified  Pulse 88   Temp (!) 97.5 F (36.4 C)   Resp 18   Ht _0  (1.651 m)   Wt 230 lb (104.3 kg)   SpO2 99%   BMI 38.27 kg/m  BMI: Estimated body mass  index is 38.27 kg/m as calculated from the following:   Height as of this encounter: $RemoveBeforeD'5\' 5"'HnowCwnIonUoce$  (1.651 m).   Weight as of this encounter: 230 lb (104.3 kg). Ideal: Ideal body weight: 57 kg (125 lb 10.6 oz) Adjusted ideal body weight: 75.9 kg (167 lb 6.4 oz)  Assessment   Status Diagnosis  Worsened Having a Flare-up Worsening 1. Cervicalgia   2. Cervical facet syndrome (HCC)   3. Cervical spondylosis   4. Chronic neck pain (Bilateral) (R>L)   5. DDD (degenerative disc disease), cervical   6. Chronic neck pain with history of cervical spinal surgery   7. Hx of cervical spine surgery      Updated Problems: Problem  Cervicalgia  Chronic Neck Pain With History of Cervical Spinal Surgery  Hx of Cervical Spine Surgery  Morbid Obesity With Bmi of 40.0-44.9, Adult (Hcc)  B12  Deficiency   Last Assessment & Plan:  She was given her B12 injection 1,000 mcg intramuscularly.  Last Assessment & Plan:  Formatting of this note might be different from the original. She was given her B12 injection 1,000 mcg intramuscularly.   Asthma    Plan of Care  Problem-specific:  No problem-specific Assessment & Plan notes found for this encounter.  Ms. MALAIA BUCHTA has a current medication list which includes the following long-term medication(s): albuterol, citalopram, diclofenac sodium, levothyroxine, olopatadine hcl, oxycodone hcl, [START ON 07/10/2021] oxycodone hcl, [START ON 08/09/2021] oxycodone hcl, and tizanidine.  Pharmacotherapy (Medications Ordered): Meds ordered this encounter  Medications   ketorolac (TORADOL) injection 60 mg   orphenadrine (NORFLEX) injection 60 mg    Orders:  Orders Placed This Encounter  Procedures   DG Cervical Spine With Flex & Extend    Patient presents with axial pain with possible radicular component.  Please evaluate for any evidence of cervical spine instability. Describe the presence of any spondylolisthesis (Antero- or retrolisthesis). If present, provide displacement "Grade" and measurement in cm. Please describe presence and specific location (Level & Laterality) of any signs of  osteoarthritis, zygapophyseal (Facet) joints DJD (including decreased joint space and/or osteophytosis), DDD, Foraminal narrowing, as well as any sclerosis and/or cyst formation. Please comment on ROM. Patient presents with axial pain with possible radicular component. Please assist [place] in identifying specific level(s) and laterality of any additional findings such as: 1. Facet (Zygapophyseal) joint DJD (Hypertrophy, space narrowing, subchondral sclerosis, and/or osteophyte formation) 2. DDD and/or IVDD (Loss of disc height, desiccation, gas patterns, osteophytes, endplate sclerosis, or "Black disc disease") 3. Pars defects 4. Spondylolisthesis,  spondylosis, and/or spondyloarthropathies (include Degree/Grade of displacement in mm) (stability) 5. Vertebral body Fractures (acute/chronic) (state percentage of collapse) 6. Demineralization (osteopenia/osteoporotic) 7. Bone pathology 8. Foraminal narrowing  9. Surgical changes    Standing Status:   Future    Standing Expiration Date:   07/20/2021    Scheduling Instructions:     Imaging must be done as soon as possible. Inform patient that order will expire within 30 days and I will not renew it.    Order Specific Question:   Reason for Exam (SYMPTOM  OR DIAGNOSIS REQUIRED)    Answer:   Cervicalgia    Order Specific Question:   Is patient pregnant?    Answer:   No    Order Specific Question:   Preferred imaging location?    Answer:   [facility]    Order Specific Question:   Call Results- Best Contact Number?    Answer:   [phone] ([facility])    Order Specific Question:   Radiology Contrast Protocol - do NOT remove file path    Answer:   \\[email or web address]    Order Specific Question:   Release to patient    Answer:   Immediate   CT CERVICAL SPINE WO CONTRAST    Patient presents with axial pain with possible radicular component. Please assist [place] in identifying specific level(s) and laterality of any additional findings such as: 1. Facet (Zygapophyseal) joint DJD (Hypertrophy, space narrowing, subchondral sclerosis, and/or osteophyte formation) 2. DDD and/or IVDD (Loss of disc height, desiccation, gas patterns, osteophytes, endplate sclerosis, or "Black disc disease") 3. Pars defects 4. Spondylolisthesis, spondylosis, and/or spondyloarthropathies (include Degree/Grade of displacement in mm) (stability) 5. Vertebral body Fractures (acute/chronic) (state percentage of collapse) 6. Demineralization (osteopenia/osteoporotic) 7. Bone pathology 8. Foraminal narrowing  9. Surgical changes 10. Central, Lateral Recess, and/or Foraminal  Stenosis (include AP diameter of stenosis in mm) 11. Surgical changes (hardware type, status, and presence of fibrosis) 12. Modic Type Changes (MRI only) 13. IVDD (Disc bulge, protrusion, herniation, extrusion) (Level, laterality, extent)    Standing Status:   Future    Standing Expiration Date:   07/20/2021    Scheduling Instructions:     Imaging must be done as soon as possible. Inform patient that order will expire within 30 days and I will not renew it.    Order Specific Question:   Is patient pregnant?    Answer:   No    Order Specific Question:   Preferred imaging location?    Answer:   [facility]    Order Specific Question:   Call Results- Best Contact Number?    Answer:   [phone] ([facility])    Order Specific Question:   Radiology Contrast Protocol - do NOT remove file path    Answer:   \\[email or web address]   Ambulatory referral to Physical Therapy    Referral Priority:   Routine    Referral Type:   Physical Medicine    Referral Reason:   Specialty Services Required    Requested Specialty:   Physical Therapy    Number of Visits Requested:   1    Follow-up plan:   Return for scheduled encounter, for review of ordered tests.     Interventional Therapies  Risk  Complexity Considerations:   Estimated body mass index is 38.27 kg/m as calculated from the following:   Height as of this encounter: _0  (1.651 m).   Weight as of this encounter: 230 lb (104.3 kg). 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  Allergic to NSAIDs.  Abnormal UDS (01/02/2016). (+) Unreported tramadol.   Planned  Pending:   Diagnostic/therapeutic right cervical facet MBB #2   Under consideration:   Possible bilateral hip joint RFA  Diagnostic caudal ESI + epidurogram  Possible Racz procedure.  Possible bilateral cervical facet RFA   Diagnostic right CESI  Diagnostic left sacroiliac joint block  Possible bilateral sacroiliac joint  RFA    Completed:   Palliative bilateral trochanteric bursa x1  Palliative right suprascapular NB x3 (08/08/2020)  Palliative left suprascapular NB x4 (02/22/2021) Palliative bilateral suprascapular nerve RFA x1 (05/13/2019) Diagnostic right IA hip joint injection x3 (06/01/2020)  Diagnostic left IA hip joint injection x2 (04/30/2018)  Diagnostic bilateral lumbar facet MBB x3  Palliative right lumbar facet RFA x2 (12/16/2017)  Palliative left lumbar facet RFA x2 (02/12/2018)  Diagnostic bilateral cervical facet MBB x1  Diagnostic right SI joint block x1  Therapeutic  Palliative (PRN) options:   Palliative bilateral trochanteric bursa #2  Palliative bilateral suprascapular nerve block #4  Palliative bilateral suprascapular nerve RFA #2  Diagnostic right IA hip joint injection #4  Diagnostic left IA hip joint injection #3  Diagnostic bilateral lumbar facet block #4  Palliative right lumbar facet RFA #3  Palliative left lumbar facet RFA #3  Diagnostic bilateral cervical facet block #2  Diagnostic right SI joint block #2     Recent Visits Date Type Provider Dept  06/04/21 Office Visit Milinda Pointer, MD [facility]  Showing recent visits within past 90 days and meeting all other requirements Today's Visits Date Type Provider Dept  06/19/21 Procedure visit Milinda Pointer, MD [facility]  Showing today's visits and meeting all other requirements Future Appointments Date Type Provider Dept  09/05/21 Appointment Milinda Pointer, MD [facility]  Showing future appointments within next 90 days and meeting all other requirements I discussed the assessment and treatment plan with the patient. The patient was provided an opportunity to ask questions and all were answered. The patient agreed with the plan and demonstrated an understanding of the instructions.  Patient advised to call back or seek an in-person evaluation if the symptoms or condition  worsens.  Duration of encounter: 20 minutes.  Note by: Gaspar Cola, MD Date: 06/19/2021; Time: 9:08 AM

## 2021-06-20 ENCOUNTER — Telehealth: Payer: Self-pay

## 2021-06-20 NOTE — Telephone Encounter (Signed)
Post procedure phone call.  Patient states she got a small amt of relief form Toradol/Norflex.

## 2021-07-05 ENCOUNTER — Ambulatory Visit: Payer: Medicaid Other

## 2021-07-18 ENCOUNTER — Ambulatory Visit
Admission: RE | Admit: 2021-07-18 | Discharge: 2021-07-18 | Disposition: A | Discharge status: Home / Self Care | Payer: Medicaid Other | Source: Ambulatory Visit | Attending: Pain Medicine | Admitting: Pain Medicine

## 2021-07-18 DIAGNOSIS — G8929 Other chronic pain: Secondary | ICD-10-CM | POA: Insufficient documentation

## 2021-07-18 DIAGNOSIS — M47812 Spondylosis without myelopathy or radiculopathy, cervical region: Secondary | ICD-10-CM | POA: Diagnosis present

## 2021-07-18 DIAGNOSIS — M5030 Other cervical disc degeneration, unspecified cervical region: Secondary | ICD-10-CM | POA: Diagnosis present

## 2021-07-18 DIAGNOSIS — Z9889 Other specified postprocedural states: Secondary | ICD-10-CM | POA: Diagnosis present

## 2021-07-18 DIAGNOSIS — M542 Cervicalgia: Secondary | ICD-10-CM | POA: Diagnosis not present

## 2021-07-18 DIAGNOSIS — G8928 Other chronic postprocedural pain: Secondary | ICD-10-CM | POA: Diagnosis present

## 2021-09-04 NOTE — Progress Notes (Signed)
PROVIDER NOTE: Information contained herein reflects review and annotations entered in association with encounter. Interpretation of such information and data should be left to medically-trained personnel. Information provided to patient can be located elsewhere in the medical record under "Patient Instructions". Document created using STT-dictation technology, any transcriptional errors that may result from process are unintentional.    Patient: Danielle [name]  Service Category: E/M  Provider: Gaspar Cola, MD  DOB: [birth year]  DOS: 09/05/2021  Specialty: Interventional Pain Management  MRN: [id number]  Setting: Ambulatory outpatient  PCP: Danae Orleans, MD  Type: Established Patient    Referring Provider: Danae Orleans, MD  Location: Office  Delivery: Face-to-face     HPI  Danielle [name], a 61 y.o. year old female, is here today because of her Chronic hip pain, left [M25.552, G89.29]. Danielle [name] primary complain today is Hip Pain (Bilateral, left is worse ) Last encounter: My last encounter with her was on 06/19/2021. Pertinent problems: Danielle [name] has Cervical spinal cord compression [facility]) (April, 2017); Cervical spinal stenosis; Chronic shoulder pain (1ry area of Pain) (Bilateral) (R>L); Chronic low back pain (3ry area of Pain) (Bilateral) (R>L) w/ sciatica (Right); Lumbar facet syndrome (Bilateral) (R>L); Lumbar spondylosis; Failed back surgical syndrome; Chronic lower extremity pain (Right); Chronic neck pain (Bilateral) (R>L); Chronic cervical radicular pain (Right); Ulnar neuropathy (Left); Hx of cervical spine surgery; Cervical spondylosis; Fibromyalgia; Chronic sacroiliac pain (Bilateral) (L>R); History of total arthroplasty of shoulder (Left); Chronic pain syndrome; Complaints of weakness of lower extremity; Anterolisthesis of lumbar spine (L4/L5); Cervical facet syndrome ([place]); Chronic musculoskeletal pain; Muscle spasm of back; Myofascial pain; Chronic hip pain (2ry area of  Pain) (Bilateral) (R>L); 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); Trigger point with back pain; Osteoarthritis of shoulder (Bilateral); Spondylosis without myelopathy or radiculopathy, lumbosacral region; Cellulitis of leg, right; Chronic trochanteric bursitis (Bilateral); Osteoarthritis involving multiple joints; Chronic shoulder pain after replacement; History of total shoulder replacement (Right); History of total replacement of shoulder joints (Bilateral); Leg edema, left; Cervical spondylosis with myelopathy and radiculopathy; Chronic hip pain (Right); Osteoarthritis of hip (Right); Chronic sacroiliac joint pain (Left); Other spondylosis, sacral and sacrococcygeal region; Sacroiliac joint dysfunction (Left); Somatic dysfunction of sacroiliac joint (Left); Enthesopathy of sacroiliac joint (Left); DDD (degenerative disc disease), cervical; DDD (degenerative disc disease), lumbosacral; Other intervertebral disc degeneration, lumbar region; Acetabular labrum tear, sequela (Left); Acetabular labrum tear, sequela (Right); Greater trochanteric bursitis of hips (Bilateral); Epidural fibrosis (Left: L5-S1); Chronic groin pain (Bilateral); Abnormal MRI, lumbar spine (09/28/2020); Abnormal MRI, hip (Bilateral) (10/04/2020); Chronic shoulder pain (Left); Cervicalgia; Chronic neck pain with history of cervical spinal surgery; Chronic hip pain (Left); and Chronic groin pain (Left) on their pertinent problem list. Pain Assessment: Severity of Chronic pain is reported as a 4 /10. Location: Back Lower, Left, Right/around to the groin on the left. Onset: More than a month ago. Quality: Discomfort, Constant, Sharp, Radiating. Timing: Constant. Modifying factor(s): medications. Vitals:  height is _0  (1.651 m) and weight is 231 lb (104.8 kg). Her temporal temperature is 98.2 F (36.8 C). Her blood pressure is 136/82 and her pulse is 99. Her respiration is 16 and oxygen saturation is 97%.   Reason for encounter: medication  management.   The patient indicates doing well with the current medication regimen. No adverse reactions or side effects reported to the medications.   The patient indicates currently having a flareup of her left hip and groin pain.  She has requested an IM injection of Toradol/Norflex.  However, her  allergies indicate that she has an allergy/intolerance to the ketorolac were she develops nausea and vomiting.  Today we also went over the results of her cervical CT which I have explained to the patient in layman's terms.  RTCB: 12/07/2021 Nonopioids transferred 05/15/2020: Zanaflex and Voltaren gel  Pharmacotherapy Assessment  Analgesic: Oxycodone IR 10 mg, 1 tab PO q 6 hrs (40 mg/day of oxycodone) MME/day: 60 mg/day.   Monitoring: [place] PMP: PDMP reviewed during this encounter.       Pharmacotherapy: No side-effects or adverse reactions reported. Compliance: No problems identified. Effectiveness: Clinically acceptable.  [name], RN  09/05/2021 12:51 PM  Sign when Signing Visit Nursing Pain Medication Assessment:  Safety precautions to be maintained throughout the outpatient stay will include: orient to surroundings, keep bed in low position, maintain call bell within reach at all times, provide assistance with transfer out of bed and ambulation.  Medication Inspection Compliance: Pill count conducted under aseptic conditions, in front of the patient. Neither the pills nor the bottle was removed from the patient's sight at any time. Once count was completed pills were immediately returned to the patient in their original bottle.  Medication: Oxycodone IR Pill/Patch Count:  18 of 120 pills remain Pill/Patch Appearance: Markings consistent with prescribed medication Bottle Appearance: Standard pharmacy container. Clearly labeled. Filled Date: 01 / 26 / 2023 Last Medication intake:  Today    UDS:  Summary  Date Value Ref Range Status  11/29/2020 Note  Final    Comment:     ==================================================================== [employer] [facility] (MW) ==================================================================== Test                             Result       Flag       Units  Drug Present and Declared for Prescription Verification   Oxycodone                      3039         EXPECTED   ng/mg creat   Oxymorphone                    84           EXPECTED   ng/mg creat   Noroxycodone                   >4098        EXPECTED   ng/mg creat   Noroxymorphone                 24           EXPECTED   ng/mg creat    Sources of oxycodone are scheduled prescription medications.    Oxymorphone, noroxycodone, and noroxymorphone are expected    metabolites of oxycodone. Oxymorphone is also available as a    scheduled prescription medication.  ==================================================================== Test                      Result    Flag   Units      Ref Range   Creatinine              244              mg/dL      >=20 ==================================================================== Declared Medications:  The flagging and interpretation on this report are based on the  following declared medications.  Unexpected  results may arise from  inaccuracies in the declared medications.   **Note: The testing scope of this panel includes these medications:   Oxycodone   **Note: The testing scope of this panel does not include the  following reported medications:   Albuterol  Cetirizine  Citalopram  Docusate  Eye Drop  Fluticasone (Trelegy)  Levothyroxine  Magnesium (Milk of Magnesia)  Naloxone  Tizanidine  Topical  Topical Diclofenac  Umeclidinium (Trelegy)  Vilanterol (Trelegy)  Vitamin D ==================================================================== For clinical consultation, please call [phone]. ====================================================================      ROS  Constitutional: Denies any fever or  chills Gastrointestinal: No reported hemesis, hematochezia, vomiting, or acute GI distress Musculoskeletal: Denies any acute onset joint swelling, redness, loss of ROM, or weakness Neurological: No reported episodes of acute onset apraxia, aphasia, dysarthria, agnosia, amnesia, paralysis, loss of coordination, or loss of consciousness  Medication Review  Fluticasone-Umeclidin-Vilant, Oxycodone HCl, albuterol, cetirizine, cholecalciferol, citalopram, diclofenac Sodium, docusate sodium, levothyroxine, magnesium hydroxide, naloxone, nystatin, and tiZANidine  History Review  Allergy: Danielle [name] is allergic to aripiprazole, duloxetine, gabapentin, gold, iodinated contrast media, naproxen sodium, nortriptyline hcl, nsaids, pregabalin, meperidine, ace inhibitors, aspirin, cefpodoxime, fluoxetine, fluticasone-salmeterol, levofloxacin, lithium, paroxetine, tape, telithromycin, theophylline, topiramate, trazodone, triamcinolone, venlafaxine, bupropion, ketorolac tromethamine, morphine, and moxifloxacin. Drug: Danielle [name]  reports no history of drug use. Alcohol:  reports no history of alcohol use. Tobacco:  reports that she has been smoking cigarettes. She has a 80.00 pack-year smoking history. She has never used smokeless tobacco. Social: Danielle [name]  reports that she has been smoking cigarettes. She has a 80.00 pack-year smoking history. She has never used smokeless tobacco. She reports that she does not drink alcohol and does not use drugs. Medical:  has a past medical history of Acute postoperative pain (01/13/2017), Anxiety, Arthritis, Asthma, Bell's palsy, Bursitis, COPD (chronic obstructive pulmonary disease) ([place]), Depression, Fibromyalgia, Heart murmur, Hepatitis C, Hiatal hernia, Hyperlipidemia, Hypertension, IBS (irritable bowel syndrome), Insomnia, and Thyroid disease. Surgical: Danielle [name]  has a past surgical history that includes necksurgery (10/21/2014); Back surgery (2013); Foot surgery (Right);  Elbow surgery (Left); Carpal tunnel release (Right); Nose surgery; Partial hysterectomy; Total shoulder replacement (Bilateral); and Colonoscopy with propofol (N/A, 02/05/2017). Family: family history includes Cancer in her mother; Gout in her mother; Heart disease in her father.  Laboratory Chemistry Profile   Renal Lab Results  Component Value Date   BUN 14 04/28/2019   CREATININE 0.98 04/28/2019   BCR 13 02/16/2019   GFRAA >60 04/28/2019   GFRNONAA >60 04/28/2019    Hepatic Lab Results  Component Value Date   AST 18 04/28/2019   ALT 16 04/28/2019   ALBUMIN 3.7 04/28/2019   ALKPHOS 80 04/28/2019    Electrolytes Lab Results  Component Value Date   NA 139 04/28/2019   K 4.1 04/28/2019   CL 105 04/28/2019   CALCIUM 9.3 04/28/2019   MG 2.1 02/16/2019    Bone Lab Results  Component Value Date   25OHVITD1 27 (L) 02/16/2019   25OHVITD2 2.0 02/16/2019   25OHVITD3 25 02/16/2019    Inflammation (CRP: Acute Phase) (ESR: Chronic Phase) Lab Results  Component Value Date   CRP 2 02/16/2019   ESRSEDRATE 23 02/16/2019         Note: Above Lab results reviewed.  Recent Imaging Review  CT CERVICAL SPINE WO CONTRAST CLINICAL DATA:  Cervicalgia.  History of cervical fusion  EXAM: CT CERVICAL SPINE WITHOUT CONTRAST  TECHNIQUE: Multidetector CT imaging of the cervical spine was performed without intravenous  contrast. Multiplanar CT image reconstructions were also generated.  COMPARISON:  MRI cervical spine 05/25/2010  FINDINGS: Alignment: Mild anterolisthesis C2-3 and C6-7  Skull base and vertebrae: Negative for fracture or mass. No evidence of spinal infection.  Soft tissues and spinal canal: No mass or soft tissue edema.  Disc levels: C2-3: Mild disc degeneration. Moderate right facet degeneration. No significant stenosis  C3-4: ACDF. Persistent lucency through the disc space compatible with nonunion. Hardware in good position. Diffuse uncinate spurring. Mild to  moderate spinal stenosis. Mild to moderate foraminal stenosis bilaterally.  C4-5: ACDF with solid fusion. Mild spinal stenosis due to uncinate spurring. Mild foraminal narrowing bilaterally  C5-6: ACDF with solid fusion. Hardware in good position. No significant stenosis  C6-7: Mild disc degeneration.  No significant stenosis  C7-T1: Disc degeneration and mild spurring.  Negative for stenosis.  Upper chest: Lung apices clear bilaterally  Other: None  IMPRESSION: 1. 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 Diffuse uncinate spurring causing moderate foraminal stenosis bilaterally and mild to moderate spinal stenosis. 2. ACDF with solid fusion C4-5 and C5-6. Mild spinal stenosis and mild foraminal narrowing bilaterally at C4-5  Electronically Signed   By: Franchot Gallo M.D.   On: 07/18/2021 11:21 Note: Reviewed        Physical Exam  General appearance: Well nourished, well developed, and well hydrated. In no apparent acute distress Mental status: Alert, oriented x 3 (person, place, & time)       Respiratory: No evidence of acute respiratory distress Eyes: PERLA Vitals: BP 136/82 (BP Location: Right Arm, Patient Position: Sitting, Cuff Size: Large)    Pulse 99    Temp 98.2 F (36.8 C) (Temporal)    Resp 16    Ht _0  (1.651 m)    Wt 231 lb (104.8 kg)    SpO2 97%    BMI 38.44 kg/m  BMI: Estimated body mass index is 38.44 kg/m as calculated from the following:   Height as of this encounter: _1  (1.651 m).   Weight as of this encounter: 231 lb (104.8 kg). Ideal: Ideal body weight: 57 kg (125 lb 10.6 oz) Adjusted ideal body weight: 76.1 kg (167 lb 12.8 oz)  Assessment   Status Diagnosis  Controlled Controlled Controlled 1. Chronic hip pain (Left)   2. Chronic groin pain (Left)   3. 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)   4. Chronic trochanteric bursitis (Bilateral)   5. Chronic shoulder pain (1ry area of Pain) (Bilateral) (R>L)   6. Chronic hip pain (2ry area of Pain)  (Bilateral) (R>L)   7. Chronic low back pain (3ry area of Pain) (Bilateral) (R>L) w/ sciatica (Right)   8. Chronic neck pain with history of cervical spinal surgery   9. Chronic shoulder pain after replacement   10. Chronic pain syndrome   11. Pharmacologic therapy   12. Chronic use of opiate for therapeutic purpose   13. Encounter for medication management      Updated Problems: Problem  Chronic hip pain (Left)  Chronic groin pain (Left)    Plan of Care  Problem-specific:  No problem-specific Assessment & Plan notes found for this encounter.  Danielle [name] has a current medication list which includes the following long-term medication(s): albuterol, citalopram, diclofenac sodium, levothyroxine, [START ON 09/08/2021] oxycodone hcl, [START ON 10/08/2021] oxycodone hcl, [START ON 11/07/2021] oxycodone hcl, and tizanidine.  Pharmacotherapy (Medications Ordered): Meds ordered this encounter  Medications   Oxycodone HCl 10 MG TABS    Sig: Take 1 tablet (  10 mg total) by mouth every 6 (six) hours as needed. Must last 30 days    Dispense:  120 tablet    Refill:  0    DO NOT: delete (not duplicate); no partial-fill (will deny script to complete), no refill request (F/U required). DISPENSE: 1 day early if closed on fill date. WARN: No CNS-depressants within 8 hrs of med.   Oxycodone HCl 10 MG TABS    Sig: Take 1 tablet (10 mg total) by mouth every 6 (six) hours as needed. Must last 30 days    Dispense:  120 tablet    Refill:  0    DO NOT: delete (not duplicate); no partial-fill (will deny script to complete), no refill request (F/U required). DISPENSE: 1 day early if closed on fill date. WARN: No CNS-depressants within 8 hrs of med.   Oxycodone HCl 10 MG TABS    Sig: Take 1 tablet (10 mg total) by mouth every 6 (six) hours as needed. Must last 30 days    Dispense:  120 tablet    Refill:  0    DO NOT: delete (not duplicate); no partial-fill (will deny script to complete), no refill  request (F/U required). DISPENSE: 1 day early if closed on fill date. WARN: No CNS-depressants within 8 hrs of med.   orphenadrine (NORFLEX) injection 60 mg   ketorolac (TORADOL) injection 60 mg   Orders:  Orders Placed This Encounter  Procedures   HIP INJECTION    Standing Status:   Future    Standing Expiration Date:   12/03/2021    Scheduling Instructions:     Side: Left-sided     Sedation: Patient's choice.     Timeframe: As soon as schedule allows   Follow-up plan:   Return for (Clinic) procedure: (L) IA Hip inj..     Interventional Therapies  Risk   Complexity Considerations:   Estimated body mass index is 38.27 kg/m as calculated from the following:   Height as of this encounter: _0  (1.651 m).   Weight as of this encounter: 230 lb (104.3 kg). 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  Allergic to NSAIDs.  Abnormal UDS (01/02/2016). (+) Unreported tramadol.   Planned   Pending:   Diagnostic/therapeutic right cervical facet MBB #2   Under consideration:   Possible bilateral hip joint RFA  Diagnostic caudal ESI + epidurogram  Possible Racz procedure.  Possible bilateral cervical facet RFA   Diagnostic right CESI  Diagnostic left sacroiliac joint block  Possible bilateral sacroiliac joint RFA    Completed:   Palliative bilateral trochanteric bursa x1  Palliative right suprascapular NB x3 (08/08/2020)  Palliative left suprascapular NB x4 (02/22/2021) Palliative bilateral suprascapular nerve RFA x1 (05/13/2019) Diagnostic right IA hip joint injection x3 (06/01/2020)  Diagnostic left IA hip joint injection x2 (04/30/2018)  Diagnostic bilateral lumbar facet MBB x3  Palliative right lumbar facet RFA x2 (12/16/2017)  Palliative left lumbar facet RFA x2 (02/12/2018)  Diagnostic bilateral cervical facet MBB x1  Diagnostic right SI joint block x1    Therapeutic   Palliative (PRN) options:   Palliative bilateral trochanteric bursa #2  Palliative bilateral  suprascapular nerve block #4  Palliative bilateral suprascapular nerve RFA #2  Diagnostic right IA hip joint injection #4  Diagnostic left IA hip joint injection #3  Diagnostic bilateral lumbar facet block #4  Palliative right lumbar facet RFA #3  Palliative left lumbar facet RFA #3  Diagnostic bilateral cervical facet block #2  Diagnostic right SI  joint block #2     Recent Visits Date Type Provider Dept  06/19/21 Procedure visit Milinda Pointer, MD [facility]  Showing recent visits within past 90 days and meeting all other requirements Today's Visits Date Type Provider Dept  09/05/21 Office Visit Milinda Pointer, MD [facility]  Showing today's visits and meeting all other requirements Future Appointments Date Type Provider Dept  11/21/21 Appointment Milinda Pointer, MD [facility]  Showing future appointments within next 90 days and meeting all other requirements  I discussed the assessment and treatment plan with the patient. The patient was provided an opportunity to ask questions and all were answered. The patient agreed with the plan and demonstrated an understanding of the instructions.  Patient advised to call back or seek an in-person evaluation if the symptoms or condition worsens.  Duration of encounter: 30 minutes.  Note by: Gaspar Cola, MD Date: 09/05/2021; Time: 2:30 PM

## 2021-09-05 ENCOUNTER — Ambulatory Visit: Payer: Medicaid Other | Attending: Pain Medicine | Admitting: Pain Medicine

## 2021-09-05 ENCOUNTER — Other Ambulatory Visit: Payer: Self-pay

## 2021-09-05 ENCOUNTER — Encounter: Payer: Self-pay | Admitting: Pain Medicine

## 2021-09-05 VITALS — BP 136/82 | HR 99 | Temp 98.2°F | Resp 16 | Ht 65.0 in | Wt 231.0 lb

## 2021-09-05 DIAGNOSIS — M5441 Lumbago with sciatica, right side: Secondary | ICD-10-CM | POA: Diagnosis present

## 2021-09-05 DIAGNOSIS — Z9889 Other specified postprocedural states: Secondary | ICD-10-CM | POA: Insufficient documentation

## 2021-09-05 DIAGNOSIS — M25519 Pain in unspecified shoulder: Secondary | ICD-10-CM

## 2021-09-05 DIAGNOSIS — M7062 Trochanteric bursitis, left hip: Secondary | ICD-10-CM | POA: Diagnosis present

## 2021-09-05 DIAGNOSIS — Z96619 Presence of unspecified artificial shoulder joint: Secondary | ICD-10-CM | POA: Diagnosis present

## 2021-09-05 DIAGNOSIS — M25512 Pain in left shoulder: Secondary | ICD-10-CM | POA: Insufficient documentation

## 2021-09-05 DIAGNOSIS — M7061 Trochanteric bursitis, right hip: Secondary | ICD-10-CM

## 2021-09-05 DIAGNOSIS — Z79899 Other long term (current) drug therapy: Secondary | ICD-10-CM

## 2021-09-05 DIAGNOSIS — Z79891 Long term (current) use of opiate analgesic: Secondary | ICD-10-CM | POA: Diagnosis present

## 2021-09-05 DIAGNOSIS — R1032 Left lower quadrant pain: Secondary | ICD-10-CM | POA: Insufficient documentation

## 2021-09-05 DIAGNOSIS — M25552 Pain in left hip: Secondary | ICD-10-CM | POA: Diagnosis not present

## 2021-09-05 DIAGNOSIS — M542 Cervicalgia: Secondary | ICD-10-CM | POA: Insufficient documentation

## 2021-09-05 DIAGNOSIS — G894 Chronic pain syndrome: Secondary | ICD-10-CM | POA: Diagnosis not present

## 2021-09-05 DIAGNOSIS — G8928 Other chronic postprocedural pain: Secondary | ICD-10-CM

## 2021-09-05 DIAGNOSIS — G8929 Other chronic pain: Secondary | ICD-10-CM

## 2021-09-05 DIAGNOSIS — M160 Bilateral primary osteoarthritis of hip: Secondary | ICD-10-CM | POA: Diagnosis not present

## 2021-09-05 DIAGNOSIS — M25511 Pain in right shoulder: Secondary | ICD-10-CM | POA: Diagnosis present

## 2021-09-05 DIAGNOSIS — M25551 Pain in right hip: Secondary | ICD-10-CM | POA: Diagnosis present

## 2021-09-05 MED ORDER — OXYCODONE HCL 10 MG PO TABS: 10.0000 mg | ORAL_TABLET | Freq: Four times a day (QID) | ORAL | 0 refills | Status: DC | PRN

## 2021-09-05 MED ORDER — KETOROLAC TROMETHAMINE 60 MG/2ML IM SOLN
60.0000 mg | Freq: Once | INTRAMUSCULAR | Status: AC
  Administered 2021-09-05: 60 mg via INTRAMUSCULAR
  Filled 2021-09-05: qty 2

## 2021-09-05 MED ORDER — ORPHENADRINE CITRATE 30 MG/ML IJ SOLN
60.0000 mg | Freq: Once | INTRAMUSCULAR | Status: AC
  Administered 2021-09-05: 60 mg via INTRAMUSCULAR
  Filled 2021-09-05: qty 2

## 2021-09-05 NOTE — Progress Notes (Signed)
Nursing Pain Medication Assessment:  Safety precautions to be maintained throughout the outpatient stay will include: orient to surroundings, keep bed in low position, maintain call bell within reach at all times, provide assistance with transfer out of bed and ambulation.  Medication Inspection Compliance: Pill count conducted under aseptic conditions, in front of the patient. Neither the pills nor the bottle was removed from the patient's sight at any time. Once count was completed pills were immediately returned to the patient in their original bottle.  Medication: Oxycodone IR Pill/Patch Count:  18 of 120 pills remain Pill/Patch Appearance: Markings consistent with prescribed medication Bottle Appearance: Standard pharmacy container. Clearly labeled. Filled Date: 01 / 26 / 2023 Last Medication intake:  Today

## 2021-09-05 NOTE — Patient Instructions (Addendum)
____________________________________________________________________________________________ ° °Medication Rules ° °Purpose: To inform patients, and their family members, of our rules and regulations. ° °Applies to: All patients receiving prescriptions (written or electronic). ° °Pharmacy of record: Pharmacy where electronic prescriptions will be sent. If written prescriptions are taken to a different pharmacy, please inform the nursing staff. The pharmacy listed in the electronic medical record should be the one where you would like electronic prescriptions to be sent. ° °Electronic prescriptions: In compliance with the [place] [employer]rengthen Opioid Misuse Prevention (STOP) Act of 2017 (Session Law 2017-74/H243), effective July 15, 2018, all controlled substances must be electronically prescribed. Calling prescriptions to the pharmacy will cease to exist. ° °Prescription refills: Only during scheduled appointments. Applies to all prescriptions. ° °NOTE: The following applies primarily to controlled substances (Opioid* Pain Medications).  ° °Type of encounter (visit): For patients receiving controlled substances, face-to-face visits are required. (Not an option or up to the patient.) ° °Patient's responsibilities: °Pain Pills: Bring all pain pills to every appointment (except for procedure appointments). °Pill Bottles: Bring pills in original pharmacy bottle. Always bring the newest bottle. Bring bottle, even if empty. °Medication refills: You are responsible for knowing and keeping track of what medications you take and those you need refilled. °The day before your appointment: write a list of all prescriptions that need to be refilled. °The day of the appointment: give the list to the admitting nurse. Prescriptions will be written only during appointments. No prescriptions will be written on procedure days. °If you forget a medication: it will not be "Called in", "Faxed", or "electronically sent". You will  need to get another appointment to get these prescribed. °No early refills. Do not call asking to have your prescription filled early. °Prescription Accuracy: You are responsible for carefully inspecting your prescriptions before leaving our office. Have the discharge nurse carefully go over each prescription with you, before taking them home. Make sure that your name is accurately spelled, that your address is correct. Check the name and dose of your medication to make sure it is accurate. Check the number of pills, and the written instructions to make sure they are clear and accurate. Make sure that you are given enough medication to last until your next medication refill appointment. °Taking Medication: Take medication as prescribed. When it comes to controlled substances, taking less pills or less frequently than prescribed is permitted and encouraged. °Never take more pills than instructed. °Never take medication more frequently than prescribed.  °Inform other Doctors: Always inform, all of your healthcare providers, of all the medications you take. °Pain Medication from other Providers: You are not allowed to accept any additional pain medication from any other Doctor or Healthcare provider. There are two exceptions to this rule. (see below) In the event that you require additional pain medication, you are responsible for notifying us, as stated below. °Cough Medicine: Often these contain an opioid, such as codeine or hydrocodone. Never accept or take cough medicine containing these opioids if you are already taking an opioid* medication. The combination may cause respiratory failure and death. °Medication Agreement: You are responsible for carefully reading and following our Medication Agreement. This must be signed before receiving any prescriptions from our practice. Safely store a copy of your signed Agreement. Violations to the Agreement will result in no further prescriptions. (Additional copies of our  Medication Agreement are available upon request.) °Laws, Rules, & Regulations: All patients are expected to follow all Federal and State Laws, Statutes, Rules, & Regulations. Ignorance of   the Laws does not constitute a valid excuse.  °Illegal drugs and Controlled Substances: The use of illegal substances (including, but not limited to marijuana and its derivatives) and/or the illegal use of any controlled substances is strictly prohibited. Violation of this rule may result in the immediate and permanent discontinuation of any and all prescriptions being written by our practice. The use of any illegal substances is prohibited. °Adopted CDC guidelines & recommendations: Target dosing levels will be at or below 60 MME/day. Use of benzodiazepines** is not recommended. ° °Exceptions: There are only two exceptions to the rule of not receiving pain medications from other Healthcare Providers. °Exception #1 (Emergencies): In the event of an emergency (i.e.: accident requiring emergency care), you are allowed to receive additional pain medication. However, you are responsible for: As soon as you are able, call our office [phone], at any time of the day or night, and leave a message stating your name, the date and nature of the emergency, and the name and dose of the medication prescribed. In the event that your call is answered by a member of our staff, make sure to document and save the date, time, and the name of the person that took your information.  °Exception #2 (Planned Surgery): In the event that you are scheduled by another doctor or dentist to have any type of surgery or procedure, you are allowed (for a period no longer than 30 days), to receive additional pain medication, for the acute post-op pain. However, in this case, you are responsible for picking up a copy of our "Post-op Pain Management for Surgeons" handout, and giving it to your surgeon or dentist. This document is available at our office, and  does not require an appointment to obtain it. Simply go to our office during business hours (Monday-Thursday from 8:00 AM to 4:00 PM) (Friday 8:00 AM to 12:00 Noon) or if you have a scheduled appointment with us, prior to your surgery, and ask for it by name. In addition, you are responsible for: calling our office [phone], at any time of the day or night, and leaving a message stating your name, name of your surgeon, type of surgery, and date of procedure or surgery. Failure to comply with your responsibilities may result in termination of therapy involving the controlled substances. °Medication Agreement Violation. Following the above rules, including your responsibilities will help you in avoiding a Medication Agreement Violation (“Breaking your Pain Medication Contract”). ° °*Opioid medications include: morphine, codeine, oxycodone, oxymorphone, hydrocodone, hydromorphone, meperidine, tramadol, tapentadol, buprenorphine, fentanyl, methadone. °**Benzodiazepine medications include: diazepam (Valium), alprazolam (Xanax), clonazepam (Klonopine), lorazepam (Ativan), clorazepate (Tranxene), chlordiazepoxide (Librium), estazolam (Prosom), oxazepam (Serax), temazepam (Restoril), triazolam (Halcion) °(Last updated: 04/11/2021) °____________________________________________________________________________________________ ° ____________________________________________________________________________________________ ° °Medication Recommendations and Reminders ° °Applies to: All patients receiving prescriptions (written and/or electronic). ° °Medication Rules & Regulations: These rules and regulations exist for your safety and that of others. They are not flexible and neither are we. Dismissing or ignoring them will be considered "non-compliance" with medication therapy, resulting in complete and irreversible termination of such therapy. (See document titled "Medication Rules" for more details.) In all conscience,  because of safety reasons, we cannot continue providing a therapy where the patient does not follow instructions. ° °Pharmacy of record:  °Definition: This is the pharmacy where your electronic prescriptions will be sent.  °We do not endorse any particular pharmacy, however, we have experienced problems with [facility] not securing enough medication supply for the community. °We do not restrict you   in your choice of pharmacy. However, once we write for your prescriptions, we will NOT be re-sending more prescriptions to fix restricted supply problems created by your pharmacy, or your insurance.  °The pharmacy listed in the electronic medical record should be the one where you want electronic prescriptions to be sent. °If you choose to change pharmacy, simply notify our nursing staff. ° °Recommendations: °Keep all of your pain medications in a safe place, under lock and key, even if you live alone. We will NOT replace lost, stolen, or damaged medication. °After you fill your prescription, take 1 week's worth of pills and put them away in a safe place. You should keep a separate, properly labeled bottle for this purpose. The remainder should be kept in the original bottle. Use this as your primary supply, until it runs out. Once it's gone, then you know that you have 1 week's worth of medicine, and it is time to come in for a prescription refill. If you do this correctly, it is unlikely that you will ever run out of medicine. °To make sure that the above recommendation works, it is very important that you make sure your medication refill appointments are scheduled at least 1 week before you run out of medicine. To do this in an effective manner, make sure that you do not leave the office without scheduling your next medication management appointment. Always ask the nursing staff to show you in your prescription , when your medication will be running out. Then arrange for the receptionist to get you a return appointment,  at least 7 days before you run out of medicine. Do not wait until you have 1 or 2 pills left, to come in. This is very poor planning and does not take into consideration that we may need to cancel appointments due to bad weather, sickness, or emergencies affecting our staff. °DO NOT ACCEPT A "Partial Fill": If for any reason your pharmacy does not have enough pills/tablets to completely fill or refill your prescription, do not allow for a "partial fill". The law allows the pharmacy to complete that prescription within 72 hours, without requiring a new prescription. If they do not fill the rest of your prescription within those 72 hours, you will need a separate prescription to fill the remaining amount, which we will NOT provide. If the reason for the partial fill is your insurance, you will need to talk to the pharmacist about payment alternatives for the remaining tablets, but again, DO NOT ACCEPT A PARTIAL FILL, unless you can trust your pharmacist to obtain the remainder of the pills within 72 hours. ° °Prescription refills and/or changes in medication(s):  °Prescription refills, and/or changes in dose or medication, will be conducted only during scheduled medication management appointments. (Applies to both, written and electronic prescriptions.) °No refills on procedure days. No medication will be changed or started on procedure days. No changes, adjustments, and/or refills will be conducted on a procedure day. Doing so will interfere with the diagnostic portion of the procedure. °No phone refills. No medications will be "called into the pharmacy". °No Fax refills. °No weekend refills. °No Holliday refills. °No after hours refills. ° °Remember:  °Business hours are:  °Monday to Thursday 8:00 AM to 4:00 PM °Provider's Schedule: °Daveda Larock, MD - Appointments are:  °Medication management: Monday and Wednesday 8:00 AM to 4:00 PM °Procedure day: Tuesday and Thursday 7:30 AM to 4:00 PM °Bilal Lateef, MD -  Appointments are:  °Medication management: Tuesday and Thursday 8:00   AM to 4:00 PM °Procedure day: Monday and Wednesday 7:30 AM to 4:00 PM °(Last update: 02/02/2020) °____________________________________________________________________________________________ ° ____________________________________________________________________________________________ ° °CBD (cannabidiol) & Delta-8 (Delta-8 tetrahydrocannabinol) WARNING ° °Intro: Cannabidiol (CBD) and tetrahydrocannabinol (THC), are two natural compounds found in plants of the Cannabis genus. They can both be extracted from hemp or cannabis. Hemp and cannabis come from the Cannabis sativa plant. Both compounds interact with your body’s endocannabinoid system, but they have very different effects. CBD does not produce the high sensation associated with cannabis. Delta-8 tetrahydrocannabinol, also known as delta-8 THC, is a psychoactive substance found in the Cannabis sativa plant, of which marijuana and hemp are two varieties. THC is responsible for the high associated with the illicit use of marijuana. ° °Applicable to: All individuals currently taking or considering taking CBD (cannabidiol) and, more important, all patients taking opioid analgesic controlled substances (pain medication). (Example: oxycodone; oxymorphone; hydrocodone; hydromorphone; morphine; methadone; tramadol; tapentadol; fentanyl; buprenorphine; butorphanol; dextromethorphan; meperidine; codeine; etc.) ° °Legal status: CBD remains a Schedule I drug prohibited for any use. CBD is illegal with one exception. In the [place], CBD has a limited Food and Drug Administration (FDA) approval for the treatment of two specific types of epilepsy disorders. Only one CBD product has been approved by the FDA for this purpose: "Epidiolex". FDA is aware that some companies are marketing products containing cannabis and cannabis-derived compounds in ways that violate the Federal Food, Drug and Cosmetic Act  (FD&C Act) and that may put the health and safety of consumers at risk. The FDA, a Federal agency, has not enforced the CBD status since 2018.  ° °Legality: Some manufacturers ship CBD products nationally, which is illegal. Often such products are sold online and are therefore available throughout the country. CBD is openly sold in head shops and health food stores in some states where such sales have not been explicitly legalized. Selling unapproved products with unsubstantiated therapeutic claims is not only a violation of the law, but also can put patients at risk, as these products have not been proven to be safe or effective. Federal illegality makes it difficult to conduct research on CBD. ° °Reference: "FDA Regulation of Cannabis and Cannabis-Derived Products, Including Cannabidiol (CBD)" - https://www.fda.gov/news-events/public-health-focus/fda-regulation-cannabis-and-cannabis-derived-products-including-cannabidiol-cbd ° °Warning: CBD is not FDA approved and has not undergo the same manufacturing controls as prescription drugs.  This means that the purity and safety of available CBD may be questionable. Most of the time, despite manufacturer's claims, it is contaminated with THC (delta-9-tetrahydrocannabinol - the chemical in marijuana responsible for the "HIGH").  When this is the case, the THC contaminant will trigger a positive urine drug screen (UDS) test for Marijuana (carboxy-THC). Because a positive UDS for any illicit substance is a violation of our medication agreement, your opioid analgesics (pain medicine) may be permanently discontinued. °The FDA recently put out a warning about 5 things that everyone should be aware of regarding Delta-8 THC: °Delta-8 THC products have not been evaluated or approved by the FDA for safe use and may be marketed in ways that put the public health at risk. °The FDA has received adverse event reports involving delta-8 THC-containing products. °Delta-8 THC has  psychoactive and intoxicating effects. °Delta-8 THC manufacturing often involve use of potentially harmful chemicals to create the concentrations of delta-8 THC claimed in the marketplace. The final delta-8 THC product may have potentially harmful by-products (contaminants) due to the chemicals used in the process. Manufacturing of delta-8 THC products may occur in uncontrolled or unsanitary settings, which may   lead to the presence of unsafe contaminants or other potentially harmful substances. °Delta-8 THC products should be kept out of the reach of children and pets. ° °MORE ABOUT CBD ° °General Information: CBD was discovered in 1940 and it is a derivative of the cannabis sativa genus plants (Marijuana and Hemp). It is one of the 113 identified substances found in Marijuana. It accounts for up to 40% of the plant's extract. As of 2018, preliminary clinical studies on CBD included research for the treatment of anxiety, movement disorders, and pain. CBD is available and consumed in multiple forms, including inhalation of smoke or vapor, as an aerosol spray, and by mouth. It may be supplied as an oil containing CBD, capsules, dried cannabis, or as a liquid solution. CBD is thought not to be as psychoactive as THC (delta-9-tetrahydrocannabinol - the chemical in marijuana responsible for the "HIGH"). Studies suggest that CBD may interact with different biological target receptors in the body, including cannabinoid and other neurotransmitter receptors. As of 2018 the mechanism of action for its biological effects has not been determined. ° °Side-effects   Adverse reactions: Dry mouth, diarrhea, decreased appetite, fatigue, drowsiness, malaise, weakness, sleep disturbances, and others. ° °Drug interactions: CBC may interact with other medications such as blood-thinners. °(Last update: 04/13/2021) °____________________________________________________________________________________________ °  ____________________________________________________________________________________________ ° °Drug Holidays (Slow) ° °What is a "Drug Holiday"? °Drug Holiday: is the name given to the period of time during which a patient stops taking a medication(s) for the purpose of eliminating tolerance to the drug. ° °Benefits °Improved effectiveness of opioids. °Decreased opioid dose needed to achieve benefits. °Improved pain with lesser dose. ° °What is tolerance? °Tolerance: is the progressive decreased in effectiveness of a drug due to its repetitive use. With repetitive use, the body gets use to the medication and as a consequence, it loses its effectiveness. This is a common problem seen with opioid pain medications. As a result, a larger dose of the drug is needed to achieve the same effect that used to be obtained with a smaller dose. ° °How long should a "Drug Holiday" last? °You should stay off of the pain medicine for at least 14 consecutive days. (2 weeks) ° °Should I stop the medicine "cold turkey"? °No. You should always coordinate with your Pain Specialist so that he/she can provide you with the correct medication dose to make the transition as smoothly as possible. ° °How do I stop the medicine? °Slowly. You will be instructed to decrease the daily amount of pills that you take by one (1) pill every seven (7) days. This is called a "slow downward taper" of your dose. For example: if you normally take four (4) pills per day, you will be asked to drop this dose to three (3) pills per day for seven (7) days, then to two (2) pills per day for seven (7) days, then to one (1) per day for seven (7) days, and at the end of those last seven (7) days, this is when the "Drug Holiday" would start.  ° °Will I have withdrawals? °By doing a "slow downward taper" like this one, it is unlikely that you will experience any significant withdrawal symptoms. Typically, what triggers withdrawals is the sudden stop of a high dose  opioid therapy. Withdrawals can usually be avoided by slowly decreasing the dose over a prolonged period of time. If you do not follow these instructions and decide to stop your medication abruptly, withdrawals may be possible. ° °What are withdrawals? °Withdrawals: refers   to the wide range of symptoms that occur after stopping or dramatically reducing opiate drugs after heavy and prolonged use. Withdrawal symptoms do not occur to patients that use low dose opioids, or those who take the medication sporadically. Contrary to benzodiazepine (example: Valium, Xanax, etc.) or alcohol withdrawals (Delirium Tremens), opioid withdrawals are not lethal. Withdrawals are the physical manifestation of the body getting rid of the excess receptors.  Expected Symptoms Early symptoms of withdrawal may include: Agitation Anxiety Muscle aches Increased tearing Insomnia Runny nose Sweating Yawning  Late symptoms of withdrawal may include: Abdominal cramping Diarrhea Dilated pupils Goose bumps Nausea Vomiting  Will I experience withdrawals? Due to the slow nature of the taper, it is very unlikely that you will experience any.  What is a slow taper? Taper: refers to the gradual decrease in dose.  (Last update: 02/02/2020) ____________________________________________________________________________________________   ______________________________________________________________________  Preparing for Procedure with Sedation  NOTICE: Due to recent regulatory changes, starting on February 12, 2021, procedures requiring intravenous (IV) sedation will no longer be performed at the [facility].  These types of procedures are required to be performed at [facility] amb[facility] surgery facility.  We are very sorry for the inconvenience.  Procedure appointments are limited to planned procedures: No Prescription Refills. No disability issues will be discussed. No medication changes will be  discussed.  Instructions: Oral Intake: Do not eat or drink anything for at least 8 hours prior to your procedure. (Exception: Blood Pressure Medication. See below.) Transportation: A driver is required. You may not drive yourself after the procedure. Blood Pressure Medicine: Do not forget to take your blood pressure medicine with a sip of water the morning of the procedure. If your Diastolic (lower reading) is above 100 mmHg, elective cases will be cancelled/rescheduled. Blood thinners: These will need to be stopped for procedures. Notify our staff if you are taking any blood thinners. Depending on which one you take, there will be specific instructions on how and when to stop it. Diabetics on insulin: Notify the staff so that you can be scheduled 1st case in the morning. If your diabetes requires high dose insulin, take only  of your normal insulin dose the morning of the procedure and notify the staff that you have done so. Preventing infections: Shower with an antibacterial soap the morning of your procedure. Build-up your immune system: Take 1000 mg of Vitamin C with every meal (3 times a day) the day prior to your procedure. Antibiotics: Inform the staff if you have a condition or reason that requires you to take antibiotics before dental procedures. Pregnancy: If you are pregnant, call and cancel the procedure. Sickness: If you have a cold, fever, or any active infections, call and cancel the procedure. Arrival: You must be in the facility at least 30 minutes prior to your scheduled procedure. Children: Do not bring children with you. Dress appropriately: Bring dark clothing that you would not mind if they get stained. Valuables: Do not bring any jewelry or valuables.  Reasons to call and reschedule or cancel your procedure: (Following these recommendations will minimize the risk of a serious complication.) Surgeries: Avoid having procedures within 2 weeks of any surgery. (Avoid for 2 weeks  before or after any surgery). Flu Shots: Avoid having procedures within 2 weeks of a flu shots. (Avoid for 2 weeks before or after immunizations). Barium: Avoid having a procedure within 7-10 days after having had a radiological study involving the use of radiological contrast. (Myelograms, Barium swallow or enema  study). Heart attacks: Avoid any elective procedures or surgeries for the initial 6 months after a "Myocardial Infarction" (Heart Attack). Blood thinners: It is imperative that you stop these medications before procedures. Let us know if you if you take any blood thinner.  Infection: Avoid procedures during or within two weeks of an infection (including chest colds or gastrointestinal problems). Symptoms associated with infections include: Localized redness, fever, chills, night sweats or profuse sweating, burning sensation when voiding, cough, congestion, stuffiness, runny nose, sore throat, diarrhea, nausea, vomiting, cold or Flu symptoms, recent or current infections. It is specially important if the infection is over the area that we intend to treat. Heart and lung problems: Symptoms that may suggest an active cardiopulmonary problem include: cough, chest pain, breathing difficulties or shortness of breath, dizziness, ankle swelling, uncontrolled high or unusually low blood pressure, and/or palpitations. If you are experiencing any of these symptoms, cancel your procedure and contact your primary care physician for an evaluation.  Remember:  Regular Business hours are:  Monday to Thursday 8:00 AM to 4:00 PM  Provider's Schedule: Milinda Pointer, MD:  Procedure days: Tuesday and Thursday 7:30 AM to 4:00 PM  Gillis Santa, MD:  Procedure days: Monday and Wednesday 7:30 AM to 4:00 PM ______________________________________________________________________  ____________________________________________________________________________________________  General Risks and Possible  Complications  Patient Responsibilities: It is important that you read this as it is part of your informed consent. It is our duty to inform you of the risks and possible complications associated with treatments offered to you. It is your responsibility as a patient to read this and to ask questions about anything that is not clear or that you believe was not covered in this document.  Patients Rights: You have the right to refuse treatment. You also have the right to change your mind, even after initially having agreed to have the treatment done. However, under this last option, if you wait until the last second to change your mind, you may be charged for the materials used up to that point.  Introduction: Medicine is not an Chief Strategy Officer. Everything in Medicine, including the lack of treatment(s), carries the potential for danger, harm, or loss (which is by definition: Risk). In Medicine, a complication is a secondary problem, condition, or disease that can aggravate an already existing one. All treatments carry the risk of possible complications. The fact that a side effects or complications occurs, does not imply that the treatment was conducted incorrectly. It must be clearly understood that these can happen even when everything is done following the highest safety standards.  No treatment: You can choose not to proceed with the proposed treatment alternative. The PRO(s) would include: avoiding the risk of complications associated with the therapy. The CON(s) would include: not getting any of the treatment benefits. These benefits fall under one of three categories: diagnostic; therapeutic; and/or palliative. Diagnostic benefits include: getting information which can ultimately lead to improvement of the disease or symptom(s). Therapeutic benefits are those associated with the successful treatment of the disease. Finally, palliative benefits are those related to the decrease of the primary  symptoms, without necessarily curing the condition (example: decreasing the pain from a flare-up of a chronic condition, such as incurable terminal cancer).  General Risks and Complications: These are associated to most interventional treatments. They can occur alone, or in combination. They fall under one of the following six (6) categories: no benefit or worsening of symptoms; bleeding; infection; nerve damage; allergic reactions; and/or death. No benefits or  worsening of symptoms: In Medicine there are no guarantees, only probabilities. No healthcare provider can ever guarantee that a medical treatment will work, they can only state the probability that it may. Furthermore, there is always the possibility that the condition may worsen, either directly, or indirectly, as a consequence of the treatment. Bleeding: This is more common if the patient is taking a blood thinner, either prescription or over the counter (example: Goody Powders, Fish oil, Aspirin, Garlic, etc.), or if suffering a condition associated with impaired coagulation (example: Hemophilia, cirrhosis of the liver, low platelet counts, etc.). However, even if you do not have one on these, it can still happen. If you have any of these conditions, or take one of these drugs, make sure to notify your treating physician. Infection: This is more common in patients with a compromised immune system, either due to disease (example: diabetes, cancer, human immunodeficiency virus [HIV], etc.), or due to medications or treatments (example: therapies used to treat cancer and rheumatological diseases). However, even if you do not have one on these, it can still happen. If you have any of these conditions, or take one of these drugs, make sure to notify your treating physician. Nerve Damage: This is more common when the treatment is an invasive one, but it can also happen with the use of medications, such as those used in the treatment of cancer. The damage  can occur to small secondary nerves, or to large primary ones, such as those in the spinal cord and brain. This damage may be temporary or permanent and it may lead to impairments that can range from temporary numbness to permanent paralysis and/or brain death. Allergic Reactions: Any time a substance or material comes in contact with our body, there is the possibility of an allergic reaction. These can range from a mild skin rash (contact dermatitis) to a severe systemic reaction (anaphylactic reaction), which can result in death. Death: In general, any medical intervention can result in death, most of the time due to an unforeseen complication. ____________________________________________________________________________________________

## 2021-09-07 ENCOUNTER — Telehealth: Payer: Self-pay | Admitting: *Deleted

## 2021-09-07 NOTE — Telephone Encounter (Signed)
[facility] has closed. Patient was not aware of this when she was here for her appt. Needs scripts sent to [facility] at [address]. Note placed on Dr. Adalberto Cole desk.

## 2021-09-10 ENCOUNTER — Other Ambulatory Visit: Payer: Self-pay | Admitting: Pain Medicine

## 2021-09-10 DIAGNOSIS — Z9889 Other specified postprocedural states: Secondary | ICD-10-CM

## 2021-09-10 DIAGNOSIS — G894 Chronic pain syndrome: Secondary | ICD-10-CM

## 2021-09-10 DIAGNOSIS — G8928 Other chronic postprocedural pain: Secondary | ICD-10-CM

## 2021-09-10 DIAGNOSIS — M25511 Pain in right shoulder: Secondary | ICD-10-CM

## 2021-09-10 DIAGNOSIS — Z79899 Other long term (current) drug therapy: Secondary | ICD-10-CM

## 2021-09-10 DIAGNOSIS — Z96619 Presence of unspecified artificial shoulder joint: Secondary | ICD-10-CM

## 2021-09-10 DIAGNOSIS — M25519 Pain in unspecified shoulder: Secondary | ICD-10-CM

## 2021-09-10 DIAGNOSIS — M542 Cervicalgia: Secondary | ICD-10-CM

## 2021-09-10 DIAGNOSIS — Z79891 Long term (current) use of opiate analgesic: Secondary | ICD-10-CM

## 2021-09-10 DIAGNOSIS — G8929 Other chronic pain: Secondary | ICD-10-CM

## 2021-09-10 MED ORDER — OXYCODONE HCL 10 MG PO TABS: 10.0000 mg | ORAL_TABLET | Freq: Four times a day (QID) | ORAL | 0 refills | Status: DC | PRN

## 2021-09-18 ENCOUNTER — Ambulatory Visit (HOSPITAL_BASED_OUTPATIENT_CLINIC_OR_DEPARTMENT_OTHER): Payer: Medicaid Other | Admitting: Pain Medicine

## 2021-09-18 ENCOUNTER — Other Ambulatory Visit: Payer: Self-pay

## 2021-09-18 ENCOUNTER — Ambulatory Visit
Admission: RE | Admit: 2021-09-18 | Discharge: 2021-09-18 | Disposition: A | Discharge status: Home / Self Care | Payer: Medicaid Other | Source: Ambulatory Visit | Attending: Pain Medicine | Admitting: Pain Medicine

## 2021-09-18 ENCOUNTER — Encounter: Payer: Self-pay | Admitting: Pain Medicine

## 2021-09-18 VITALS — BP 145/83 | HR 95 | Temp 98.1°F | Resp 15 | Ht 65.0 in | Wt 230.0 lb

## 2021-09-18 DIAGNOSIS — M160 Bilateral primary osteoarthritis of hip: Secondary | ICD-10-CM

## 2021-09-18 DIAGNOSIS — Z91041 Radiographic dye allergy status: Secondary | ICD-10-CM

## 2021-09-18 DIAGNOSIS — G8929 Other chronic pain: Secondary | ICD-10-CM

## 2021-09-18 DIAGNOSIS — M25551 Pain in right hip: Secondary | ICD-10-CM | POA: Insufficient documentation

## 2021-09-18 DIAGNOSIS — M7062 Trochanteric bursitis, left hip: Secondary | ICD-10-CM | POA: Insufficient documentation

## 2021-09-18 DIAGNOSIS — M7061 Trochanteric bursitis, right hip: Secondary | ICD-10-CM | POA: Insufficient documentation

## 2021-09-18 DIAGNOSIS — M25552 Pain in left hip: Secondary | ICD-10-CM

## 2021-09-18 DIAGNOSIS — Z87892 Personal history of anaphylaxis: Secondary | ICD-10-CM | POA: Insufficient documentation

## 2021-09-18 DIAGNOSIS — Z6841 Body Mass Index (BMI) 40.0 and over, adult: Secondary | ICD-10-CM | POA: Insufficient documentation

## 2021-09-18 DIAGNOSIS — R1032 Left lower quadrant pain: Secondary | ICD-10-CM | POA: Insufficient documentation

## 2021-09-18 MED ORDER — LACTATED RINGERS IV SOLN
1000.0000 mL | Freq: Once | INTRAVENOUS | Status: AC
  Administered 2021-09-18: 1000 mL via INTRAVENOUS

## 2021-09-18 MED ORDER — ROPIVACAINE HCL 2 MG/ML IJ SOLN
INTRAMUSCULAR | Status: AC
  Filled 2021-09-18: qty 20

## 2021-09-18 MED ORDER — MIDAZOLAM HCL 5 MG/5ML IJ SOLN
INTRAMUSCULAR | Status: AC
  Filled 2021-09-18: qty 5

## 2021-09-18 MED ORDER — METHYLPREDNISOLONE ACETATE 80 MG/ML IJ SUSP
INTRAMUSCULAR | Status: AC
  Filled 2021-09-18: qty 1

## 2021-09-18 MED ORDER — LIDOCAINE HCL 2 % IJ SOLN
INTRAMUSCULAR | Status: AC
  Filled 2021-09-18: qty 20

## 2021-09-18 MED ORDER — LIDOCAINE HCL 2 % IJ SOLN
20.0000 mL | Freq: Once | INTRAMUSCULAR | Status: AC
  Administered 2021-09-18: 400 mg

## 2021-09-18 MED ORDER — METHYLPREDNISOLONE ACETATE 80 MG/ML IJ SUSP
80.0000 mg | Freq: Once | INTRAMUSCULAR | Status: AC
  Administered 2021-09-18: 80 mg via INTRA_ARTICULAR

## 2021-09-18 MED ORDER — PENTAFLUOROPROP-TETRAFLUOROETH EX AERO
INHALATION_SPRAY | Freq: Once | CUTANEOUS | Status: DC
  Filled 2021-09-18: qty 116

## 2021-09-18 MED ORDER — MIDAZOLAM HCL 5 MG/5ML IJ SOLN
0.5000 mg | Freq: Once | INTRAMUSCULAR | Status: AC
  Administered 2021-09-18: 2 mg via INTRAVENOUS

## 2021-09-18 MED ORDER — ROPIVACAINE HCL 2 MG/ML IJ SOLN
9.0000 mL | Freq: Once | INTRAMUSCULAR | Status: AC
  Administered 2021-09-18: 9 mL via INTRA_ARTICULAR

## 2021-09-18 NOTE — Patient Instructions (Signed)

## 2021-09-18 NOTE — Progress Notes (Signed)
PROVIDER NOTE: Interpretation of information contained herein should be left to medically-trained personnel. Specific patient instructions are provided elsewhere under "Patient Instructions" section of medical record. This document was created in part using STT-dictation technology, any transcriptional errors that may result from this process are unintentional.  Patient: Danielle [name] Type: Established DOB: [birth year] MRN: [id number] PCP: Danae Orleans, MD  Service: Procedure DOS: 09/18/2021 Setting: Ambulatory Location: Ambulatory outpatient facility Delivery: Face-to-face Provider: Gaspar Cola, MD Specialty: Interventional Pain Management Specialty designation: 09 Location: Outpatient facility Ref. Prov.: Milinda Pointer, MD    Primary Reason for Visit: Interventional Pain Management Treatment. CC: Hip Pain (left)    Procedure #1:  Anesthesia, Analgesia, Anxiolysis:  Type: Intra-Articular Hip Injection #4  Primary Purpose: Therapeutic Region: Posterolateral hip joint area. Level: Lower pelvic and hip joint level. Target Area: Superior aspect of the hip joint cavity, going thru the superior portion of the capsular ligament. Approach: Posterolateral approach. Laterality: Left  Anesthesia: Local (1-2% Lidocaine)  Anxiolysis: IV  Sedation: None  Guidance: Fluoroscopy           Position: Lateral Decubitus with bad side up Area Prepped: Entire Posterolateral hip area. DuraPrep (Iodine Povacrylex [0.7% available iodine] and Isopropyl Alcohol, 74% w/w)   Procedure #2:    Type: Trochanteric Bursa Injection #1  Primary Purpose: Therapeutic Region: Upper (proximal) Femoral Region Level: Hip Joint Target Area: Superior aspect of the hip joint cavity, going thru the superior portion of the capsular ligament. Approach: Posterolateral approach Laterality: Left     1. Chronic hip pain (Left)   2. 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)   3. Chronic trochanteric bursitis  (Bilateral)   4. Chronic groin pain (Left)   5. Chronic hip pain (2ry area of Pain) (Bilateral) (R>L)    Morbid obesity with BMI of 40.0-44.9, adult (HCC)    History of allergy to iodine & radiographic dye    History of drug-induced anaphylaxis (Contrast Dye)    NAS-11 Pain score:   Pre-procedure: 4 /10   Post-procedure: 6 /10     Pre-op H&P Assessment:  Danielle [name] is a 61 y.o. (year old), female patient, seen today for interventional treatment. She  has a past surgical history that includes necksurgery (10/21/2014); Back surgery (2013); Foot surgery (Right); Elbow surgery (Left); Carpal tunnel release (Right); Nose surgery; Partial hysterectomy; Total shoulder replacement (Bilateral); and Colonoscopy with propofol (N/A, 02/05/2017). Danielle [name] has a current medication list which includes the following prescription(s): albuterol, cetirizine, cholecalciferol, citalopram, docusate sodium, levothyroxine, magnesium hydroxide, naloxone, nystatin, oxycodone hcl, [START ON 10/08/2021] oxycodone hcl, [START ON 11/07/2021] oxycodone hcl, trelegy ellipta, diclofenac sodium, and tizanidine, and the following Facility-Administered Medications: pentafluoroprop-tetrafluoroeth. Her primarily concern today is the Hip Pain (left)  Initial Vital Signs:  Pulse/HCG Rate: 95ECG Heart Rate: 87 Temp: 98.1 F (36.7 C) Resp: 16 BP: (!) 173/81 SpO2: 98 %  BMI: Estimated body mass index is 38.27 kg/m as calculated from the following:   Height as of this encounter: '5\' 5"'$  (1.651 m).   Weight as of this encounter: 230 lb (104.3 kg).  Risk Assessment: Allergies: Reviewed. She is allergic to aripiprazole, duloxetine, gabapentin, gold, iodinated contrast media, naproxen sodium, nortriptyline hcl, nsaids, pregabalin, meperidine, ace inhibitors, aspirin, cefpodoxime, fluoxetine, fluticasone-salmeterol, levofloxacin, lithium, paroxetine, tape, telithromycin, theophylline, topiramate, trazodone, triamcinolone, venlafaxine,  bupropion, ketorolac tromethamine, morphine, and moxifloxacin.  Allergy Precautions: None required Coagulopathies: Reviewed. None identified.  Blood-thinner therapy: None at this time Active Infection(s): Reviewed. None identified. Danielle [name] is afebrile  Site Confirmation:  Danielle [name] was asked to confirm the procedure and laterality before marking the site Procedure checklist: Completed Consent: Before the procedure and under the influence of no sedative(s), amnesic(s), or anxiolytics, the patient was informed of the treatment options, risks and possible complications. To fulfill our ethical and legal obligations, as recommended by the American Medical Association's Code of Ethics, I have informed the patient of my clinical impression; the nature and purpose of the treatment or procedure; the risks, benefits, and possible complications of the intervention; the alternatives, including doing nothing; the risk(s) and benefit(s) of the alternative treatment(s) or procedure(s); and the risk(s) and benefit(s) of doing nothing. The patient was provided information about the general risks and possible complications associated with the procedure. These may include, but are not limited to: failure to achieve desired goals, infection, bleeding, organ or nerve damage, allergic reactions, paralysis, and death. In addition, the patient was informed of those risks and complications associated to the procedure, such as failure to decrease pain; infection; bleeding; organ or nerve damage with subsequent damage to sensory, motor, and/or autonomic systems, resulting in permanent pain, numbness, and/or weakness of one or several areas of the body; allergic reactions; (i.e.: anaphylactic reaction); and/or death. Furthermore, the patient was informed of those risks and complications associated with the medications. These include, but are not limited to: allergic reactions (i.e.: anaphylactic or anaphylactoid reaction(s));  adrenal axis suppression; blood sugar elevation that in diabetics may result in ketoacidosis or comma; water retention that in patients with history of congestive heart failure may result in shortness of breath, pulmonary edema, and decompensation with resultant heart failure; weight gain; swelling or edema; medication-induced neural toxicity; particulate matter embolism and blood vessel occlusion with resultant organ, and/or nervous system infarction; and/or aseptic necrosis of one or more joints. Finally, the patient was informed that Medicine is not an exact science; therefore, there is also the possibility of unforeseen or unpredictable risks and/or possible complications that may result in a catastrophic outcome. The patient indicated having understood very clearly. We have given the patient no guarantees and we have made no promises. Enough time was given to the patient to ask questions, all of which were answered to the patient's satisfaction. Danielle [name] has indicated that she wanted to continue with the procedure. Attestation: I, the ordering provider, attest that I have discussed with the patient the benefits, risks, side-effects, alternatives, likelihood of achieving goals, and potential problems during recovery for the procedure that I have provided informed consent. Date   Time: 09/18/2021 10:56 AM  Pre-Procedure Preparation:  Monitoring: As per clinic protocol. Respiration, ETCO2, SpO2, BP, heart rate and rhythm monitor placed and checked for adequate function Safety Precautions: Patient was assessed for positional comfort and pressure points before starting the procedure. Time-out: I initiated and conducted the "Time-out" before starting the procedure, as per protocol. The patient was asked to participate by confirming the accuracy of the "Time Out" information. Verification of the correct person, site, and procedure were performed and confirmed by me, the nursing staff, and the patient.  "Time-out" conducted as per Joint Commission's Universal Protocol (UP.01.01.01). Time: 1147  Description of Procedure #1:  Safety Precautions: Aspiration looking for blood return was conducted prior to all injections. At no point did we inject any substances, as a needle was being advanced. No attempts were made at seeking any paresthesias. Safe injection practices and needle disposal techniques used. Medications properly checked for expiration dates. SDV (single dose vial) medications used. Description of the Procedure: Protocol  guidelines were followed. The patient was placed in position over the fluoroscopy table. The target area was identified and the area prepped in the usual manner. Skin & deeper tissues infiltrated with local anesthetic. Appropriate amount of time allowed to pass for local anesthetics to take effect. The procedure needles were then advanced to the target area. Proper needle placement secured. Negative aspiration confirmed. Solution injected in intermittent fashion, asking for systemic symptoms every 0.5cc of injectate. The needles were then removed and the area cleansed, making sure to leave some of the prepping solution back to take advantage of its long term bactericidal properties.  Start Time: 1147 hrs. Materials:  Needle(s) Type: Spinal Needle Gauge: 22G Length: 5.0-in Medication(s): Please see orders for medications and dosing details.  Imaging Guidance (Non-Spinal):          Type of Imaging Technique: Fluoroscopy Guidance (Non-Spinal) Indication(s): Assistance in needle guidance and placement for procedures requiring needle placement in or near specific anatomical locations not easily accessible without such assistance. Exposure Time: Please see nurses notes. Contrast: Before injecting any contrast, we confirmed that the patient did not have an allergy to iodine, shellfish, or radiological contrast. Once satisfactory needle placement was completed at the desired  level, radiological contrast was injected. Contrast injected under live fluoroscopy. No contrast complications. See chart for type and volume of contrast used. Fluoroscopic Guidance: I was personally present during the use of fluoroscopy. "Tunnel Vision Technique" used to obtain the best possible view of the target area. Parallax error corrected before commencing the procedure. "Direction-depth-direction" technique used to introduce the needle under continuous pulsed fluoroscopy. Once target was reached, antero-posterior, oblique, and lateral fluoroscopic projection used confirm needle placement in all planes. Images permanently stored in EMR.      Interpretation: I personally interpreted the imaging intraoperatively. Adequate needle placement confirmed in multiple planes. Appropriate spread of contrast into desired area was observed. No evidence of afferent or efferent intravascular uptake. Permanent images saved into the patient's record.   Description of Procedure #2:  Description of the Procedure: Skin & deeper tissues infiltrated with local anesthetic. Appropriate amount of time allowed to pass for local anesthetics to take effect. The procedure needles were then advanced to the target area. Proper needle placement secured. Negative aspiration confirmed. Solution injected in intermittent fashion, asking for systemic symptoms every 0.5cc of injectate. The needles were then removed and the area cleansed, making sure to leave some of the prepping solution back to take advantage of its long term bactericidal properties.  Vitals:   09/18/21 1147 09/18/21 1152 09/18/21 1154 09/18/21 1200  BP: (!) 114/54 123/65 115/66 (!) 145/83  Pulse:      Resp: 13 16 (!) 9 15  Temp:      TempSrc:      SpO2: 95% 96% 97% 98%  Weight:      Height:         End Time: 1153 hrs.         Materials:  Needle(s) Type: Spinal Needle Gauge: 22G Length: 5.0-in Medication(s): Please see orders for medications and  dosing details.  Imaging Guidance (Non-Spinal):          Type of Imaging Technique: Fluoroscopy Guidance (Non-Spinal) Indication(s): Assistance in needle guidance and placement for procedures requiring needle placement in or near specific anatomical locations not easily accessible without such assistance. Exposure Time: Please see nurses notes. Contrast: Before injecting any contrast, we confirmed that the patient did not have an allergy to iodine, shellfish, or radiological contrast.  Once satisfactory needle placement was completed at the desired level, radiological contrast was injected. Contrast injected under live fluoroscopy. No contrast complications. See chart for type and volume of contrast used. Fluoroscopic Guidance: I was personally present during the use of fluoroscopy. "Tunnel Vision Technique" used to obtain the best possible view of the target area. Parallax error corrected before commencing the procedure. "Direction-depth-direction" technique used to introduce the needle under continuous pulsed fluoroscopy. Once target was reached, antero-posterior, oblique, and lateral fluoroscopic projection used confirm needle placement in all planes. Images permanently stored in EMR. Interpretation: I personally interpreted the imaging intraoperatively. Adequate needle placement confirmed in multiple planes. Appropriate spread of contrast into desired area was observed. No evidence of afferent or efferent intravascular uptake. Permanent images saved into the patient's record.  Antibiotic Prophylaxis:   Anti-infectives (From admission, onward)    None      Indication(s): None identified  Post-operative Assessment:  Post-procedure Vital Signs:  Pulse/HCG Rate: 9595 Temp: 98.1 F (36.7 C) Resp: 15 BP: (!) 145/83 SpO2: 98 %  EBL: None  Complications: No immediate post-treatment complications observed by team, or reported by patient.  Note: The patient tolerated the entire procedure  well. A repeat set of vitals were taken after the procedure and the patient was kept under observation following institutional policy, for this type of procedure. Post-procedural neurological assessment was performed, showing return to baseline, prior to discharge. The patient was provided with post-procedure discharge instructions, including a section on how to identify potential problems. Should any problems arise concerning this procedure, the patient was given instructions to immediately contact us, at any time, without hesitation. In any case, we plan to contact the patient by telephone for a follow-up status report regarding this interventional procedure.  Comments:  No additional relevant information.  Plan of Care  Orders:  Orders Placed This Encounter  Procedures   HIP INJECTION    Scheduling Instructions:     Side: Left-sided     Sedation: Patient's choice.     Timeframe: Today   HIP INJECTION    Purpose: Palliative Indication: Hip pain 2ry to Trochanteric Burlitis left (M70.62).    Scheduling Instructions:     Procedure: Trochanteric bursa injection     Laterality: Left-sided     Sedation: Patient's choice.     Timeframe: Today   DG PAIN CLIN[facility] C-ARM 1-60 MIN NO REPORT    Intraoperative interpretation by procedural physician at [place].    Standing Status:   Standing    Number of Occurrences:   1    Order Specific Question:   Reason for exam:    Answer:   Assistance in needle guidance and placement for procedures requiring needle placement in or near specific anatomical locations not easily accessible without such assistance.   Informed Consent Details: Physician/Practitioner Attestation; Transcribe to consent form and obtain patient signature    Nursing Order: Transcribe to consent form and obtain patient signature. Note: Always confirm laterality of pain with Danielle [name], before procedure.    Order Specific Question:   Physician/Practitioner attestation of  informed consent for procedure/surgical case    Answer:   I, the physician/practitioner, attest that I have discussed with the patient the benefits, risks, side effects, alternatives, likelihood of achieving goals and potential problems during recovery for the procedure that I have provided informed consent.    Order Specific Question:   Procedure    Answer:   Hip injection    Order Specific Question:   Physician/Practitioner  performing the procedure    Answer:   [place]. [name], MD    Order Specific Question:   Indication/Reason    Answer:   Hip Joint Pain (Arthralgia)   Informed Consent Details: Physician/Practitioner Attestation; Transcribe to consent form and obtain patient signature    Note: Always confirm laterality of pain with Danielle [name], before procedure. Transcribe to consent form and obtain patient signature.    Order Specific Question:   Physician/Practitioner attestation of informed consent for procedure/surgical case    Answer:   I, the physician/practitioner, attest that I have discussed with the patient the benefits, risks, side effects, alternatives, likelihood of achieving goals and potential problems during recovery for the procedure that I have provided informed consent.    Order Specific Question:   Procedure    Answer:   Hip bursa injection    Order Specific Question:   Physician/Practitioner performing the procedure    Answer:   Raziya Aveni A. [name], MD    Order Specific Question:   Indication/Reason    Answer:   Hip bursitis   Provide equipment / supplies at bedside    "Block Tray" (Disposable   single use) Needle type: SpinalSpinal Amount/quantity: 1 Size: Long (7-inch) Gauge: 22G    Standing Status:   Standing    Number of Occurrences:   1    Order Specific Question:   Specify    Answer:   Block Tray   Miscellanous precautions    Standing Status:   Standing    Number of Occurrences:   1   Chronic Opioid Analgesic:  Oxycodone IR 10 mg, 1 tab PO q 6 hrs  (40 mg/day of oxycodone) MME/day: 60 mg/day.   Medications ordered for procedure: Meds ordered this encounter  Medications   lidocaine (XYLOCAINE) 2 % (with pres) injection 400 mg   pentafluoroprop-tetrafluoroeth (GEBAUERS) aerosol   lactated ringers infusion 1,000 mL   midazolam (VERSED) 5 MG/5ML injection 0.5-2 mg    Make sure Flumazenil is available in the pyxis when using this medication. If oversedation occurs, administer 0.2 mg IV over 15 sec. If after 45 sec no response, administer 0.2 mg again over 1 min; may repeat at 1 min intervals; not to exceed 4 doses (1 mg)   ropivacaine (PF) 2 mg/mL (0.2%) (NAROPIN) injection 9 mL   methylPREDNISolone acetate (DEPO-MEDROL) injection 80 mg   Medications administered: We administered lidocaine, lactated ringers, midazolam, ropivacaine (PF) 2 mg/mL (0.2%), and methylPREDNISolone acetate.  See the medical record for exact dosing, route, and time of administration.  Follow-up plan:   Return in about 2 weeks (around 10/02/2021) for Proc-day (T,Th), (VV), (PPE).       Interventional Therapies  Risk   Complexity Considerations:   Estimated body mass index is 38.27 kg/m as calculated from the following:   Height as of this encounter: '5\' 5"'$  (1.651 m).   Weight as of this encounter: 230 lb (104.3 kg). 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  Allergic to NSAIDs.  Abnormal UDS (01/02/2016). (+) Unreported tramadol.   Planned   Pending:   Diagnostic/therapeutic right cervical facet MBB #2   Under consideration:   Possible bilateral hip joint RFA  Diagnostic caudal ESI + epidurogram  Possible Racz procedure.  Possible bilateral cervical facet RFA   Diagnostic right CESI  Diagnostic left sacroiliac joint block  Possible bilateral sacroiliac joint RFA    Completed:   Palliative bilateral trochanteric bursa x1  Palliative right suprascapular NB x3 (08/08/2020)  Palliative left suprascapular  NB x4 (02/22/2021) Palliative  bilateral suprascapular nerve RFA x1 (05/13/2019) Diagnostic right IA hip joint injection x3 (06/01/2020)  Diagnostic left IA hip joint injection x2 (04/30/2018)  Diagnostic bilateral lumbar facet MBB x3  Palliative right lumbar facet RFA x2 (12/16/2017)  Palliative left lumbar facet RFA x2 (02/12/2018)  Diagnostic bilateral cervical facet MBB x1  Diagnostic right SI joint block x1    Therapeutic   Palliative (PRN) options:   Palliative bilateral trochanteric bursa #2  Palliative bilateral suprascapular nerve block #4  Palliative bilateral suprascapular nerve RFA #2  Diagnostic right IA hip joint injection #4  Diagnostic left IA hip joint injection #3  Diagnostic bilateral lumbar facet block #4  Palliative right lumbar facet RFA #3  Palliative left lumbar facet RFA #3  Diagnostic bilateral cervical facet block #2  Diagnostic right SI joint block #2     Recent Visits Date Type Provider Dept  09/05/21 Office Visit Milinda Pointer, MD [facility]  Showing recent visits within past 90 days and meeting all other requirements Today's Visits Date Type Provider Dept  09/18/21 Procedure visit Milinda Pointer, MD [facility]  Showing today's visits and meeting all other requirements Future Appointments Date Type Provider Dept  10/09/21 Appointment Milinda Pointer, MD [facility]  11/21/21 Appointment Milinda Pointer, MD [facility]  Showing future appointments within next 90 days and meeting all other requirements  Disposition: Discharge home  Discharge (Date   Time): 09/18/2021; 1208 hrs.   Primary Care Physician: Danae Orleans, MD Location: [facility] Note by: Gaspar Cola, MD Date: 09/18/2021; Time: 12:44 PM  Disclaimer:  Medicine is not an Chief Strategy Officer. The only guarantee in medicine is that nothing is guaranteed. It is important to note that the decision to proceed with this intervention was  based on the information collected from the patient. The Data and conclusions were drawn from the patient's questionnaire, the interview, and the physical examination. Because the information was provided in large part by the patient, it cannot be guaranteed that it has not been purposely or unconsciously manipulated. Every effort has been made to obtain as much relevant data as possible for this evaluation. It is important to note that the conclusions that lead to this procedure are derived in large part from the available data. Always take into account that the treatment will also be dependent on availability of resources and existing treatment guidelines, considered by other Pain Management Practitioners as being common knowledge and practice, at the time of the intervention. For Medico-Legal purposes, it is also important to point out that variation in procedural techniques and pharmacological choices are the acceptable norm. The indications, contraindications, technique, and results of the above procedure should only be interpreted and judged by a Board-Certified Interventional Pain Specialist with extensive familiarity and expertise in the same exact procedure and technique.

## 2021-09-19 ENCOUNTER — Telehealth: Payer: Self-pay | Admitting: *Deleted

## 2021-09-19 NOTE — Telephone Encounter (Signed)
No problems post procedure. 

## 2021-10-04 ENCOUNTER — Other Ambulatory Visit: Payer: Self-pay | Admitting: Pain Medicine

## 2021-10-04 DIAGNOSIS — M62830 Muscle spasm of back: Secondary | ICD-10-CM

## 2021-10-04 DIAGNOSIS — G8929 Other chronic pain: Secondary | ICD-10-CM

## 2021-10-09 ENCOUNTER — Other Ambulatory Visit: Payer: Self-pay

## 2021-10-09 ENCOUNTER — Ambulatory Visit: Payer: Medicaid Other | Attending: Pain Medicine | Admitting: Pain Medicine

## 2021-10-09 DIAGNOSIS — M7061 Trochanteric bursitis, right hip: Secondary | ICD-10-CM

## 2021-10-09 DIAGNOSIS — M25552 Pain in left hip: Secondary | ICD-10-CM | POA: Diagnosis not present

## 2021-10-09 DIAGNOSIS — M7062 Trochanteric bursitis, left hip: Secondary | ICD-10-CM

## 2021-10-09 DIAGNOSIS — R1032 Left lower quadrant pain: Secondary | ICD-10-CM

## 2021-10-09 DIAGNOSIS — G894 Chronic pain syndrome: Secondary | ICD-10-CM | POA: Diagnosis not present

## 2021-10-09 DIAGNOSIS — G8929 Other chronic pain: Secondary | ICD-10-CM

## 2021-10-09 NOTE — Progress Notes (Signed)
Patient: Danielle [name]  Service Category: E/M  Provider: Gaspar Cola, MD  ?DOB: Nov 20, 1960  DOS: 10/09/2021  Location: Office  ?MRN: [id number]  Setting: Ambulatory outpatient  Referring Provider: Danae Orleans, MD  ?Type: Established Patient  Specialty: Interventional Pain Management  PCP: Danae Orleans, MD  ?Location: Remote location  Delivery: TeleHealth    ? ?Virtual Encounter - Pain Management ?PROVIDER NOTE: Information contained herein reflects review and annotations entered in association with encounter. Interpretation of such information and data should be left to medically-trained personnel. Information provided to patient can be located elsewhere in the medical record under "Patient Instructions". Document created using STT-dictation technology, any transcriptional errors that may result from process are unintentional.  ?  ?Contact & Pharmacy ?Preferred: [phone] ?Home: [phone] (home) ?Mobile: [phone] (mobile) ?E-mail: [email or web address]  ?[place], [place] ?[address] ?[place] [address] ?Phone: [phone] Fax: [phone] ? ?[facility] [place], [place] [facility] ?[place] ?[place] [address] ?Phone: [phone] Fax: [phone] ?  ?Pre-screening  ?Danielle [name] offered "in-person" vs "virtual" encounter. She indicated preferring virtual for this encounter.  ? ?Reason ?COVID-19*  Social distancing based on CDC and AMA recommendations.  ? ?I contacted Danielle [name] on 10/09/2021 via telephone.      I clearly identified myself as Gaspar Cola, MD. I verified that I was speaking with the correct person using two identifiers (Name: Danielle [name], and date of birth: [birth year]). ? ?Consent ?I sought verbal advanced consent from Danielle [name] for virtual visit interactions. I informed Danielle [name] of possible security and privacy concerns, risks, and limitations associated  with providing "not-in-person" medical evaluation and management services. I also informed Danielle [name] of the availability of "in-person" appointments. Finally, I informed her that there would be a charge for the virtual visit and that she could be  personally, fully or partially, financially responsible for it. Danielle [name] expressed understanding and agreed to proceed.  ? ?Historic Elements   ?Danielle [name] is a 61 y.o. year old, female patient evaluated today after our last contact on 10/04/2021. Danielle [name]  has a past medical history of Acute postoperative pain (01/13/2017), Anxiety, Arthritis, Asthma, Bell's palsy, Bursitis, COPD (chronic obstructive pulmonary disease) ([place]), Depression, Fibromyalgia, Heart murmur, Hepatitis C, Hiatal hernia, Hyperlipidemia, Hypertension, IBS (irritable bowel syndrome), Insomnia, and Thyroid disease. She also  has a past surgical history that includes necksurgery (10/21/2014); Back surgery (2013); Foot surgery (Right); Elbow surgery (Left); Carpal tunnel release (Right); Nose surgery; Partial hysterectomy; Total shoulder replacement (Bilateral); and Colonoscopy with propofol (N/A, 02/05/2017). Danielle [name] has a current medication list which includes the following prescription(s): albuterol, azelastine hcl, cetirizine, cholecalciferol, citalopram, diclofenac sodium, docusate sodium, guaifenesin, levothyroxine, magnesium hydroxide, naloxone, nystatin, oxycodone hcl, oxycodone hcl, [START ON 11/07/2021] oxycodone hcl, tizanidine, and trelegy ellipta. She  reports that she has been smoking cigarettes. She has a 80.00 pack-year smoking history. She has never used smokeless tobacco. She reports that she does not drink alcohol and does not use drugs. Danielle [name] is allergic to aripiprazole, duloxetine, gabapentin, gold, iodinated contrast media, naproxen sodium, nortriptyline hcl, nsaids, pregabalin, meperidine, ace inhibitors, aspirin, cefpodoxime, fluoxetine,  fluticasone-salmeterol, levofloxacin, lithium, paroxetine, tape, telithromycin, theophylline, topiramate, trazodone, triamcinolone, venlafaxine, bupropion, ketorolac tromethamine, morphine, and moxifloxacin.  ? ?HPI  ?Today, she is being contacted for a post-procedure assessment.  The patient reported a 100% relief of the  pain for the duration of the local anesthetic followed by an ongoing 90% relief of her left hip pain. ? ?Post-procedure evaluation  ?   ?Procedure #1:  Anesthesia, Analgesia, Anxiolysis:  ?Type: Intra-Articular Hip Injection #4  ?Primary Purpose: Therapeutic ?Region: Posterolateral hip joint area. ?Level: Lower pelvic and hip joint level. ?Target Area: Superior aspect of the hip joint cavity, going thru the superior portion of the capsular ligament. ?Approach: Posterolateral approach. ?Laterality: Left  Anesthesia: Local (1-2% Lidocaine)  ?Anxiolysis: IV  ?Sedation: None  ?Guidance: Fluoroscopy         ? ? ?Position: Lateral Decubitus with bad side up ?Area Prepped: Entire Posterolateral hip area. ?DuraPrep (Iodine Povacrylex [0.7% available iodine] and Isopropyl Alcohol, 74% w/w)  ? ?Procedure #2:    ?Type: Trochanteric Bursa Injection #1  ?Primary Purpose: Therapeutic ?Region: Upper (proximal) Femoral Region ?Level: Hip Joint ?Target Area: Superior aspect of the hip joint cavity, going thru the superior portion of the capsular ligament. ?Approach: Posterolateral approach ?Laterality: Left    ? ?1. Chronic hip pain (Left)   ?2. 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)   ?3. Chronic trochanteric bursitis (Bilateral)   ?4. Chronic groin pain (Left)   ?5. Chronic hip pain (2ry area of Pain) (Bilateral) (R>L)   ? Morbid obesity with BMI of 40.0-44.9, adult ([place])   ? History of allergy to iodine & radiographic dye   ? History of drug-induced anaphylaxis (Contrast Dye)   ? ?NAS-11 Pain score:  ? Pre-procedure: 4 /10  ? Post-procedure: 6 /10  ? ?   ?Effectiveness:  ?Initial hour after procedure: 100  %. ?Subsequent 4-6 hours post-procedure: 100 %. ?Analgesia past initial 6 hours: 90 %. ?Ongoing improvement:  ?Analgesic: The patient indicates having an ongoing 90% relief of her hip pain.  She states that last night she had some discomfort when she moves the wrong way and it gave her some pain in the groin area however its been getting better over the day. ?Function: Danielle [name] reports improvement in function ?ROM: Danielle [name] reports improvement in ROM ? ?Pharmacotherapy Assessment  ? ?Opioid Analgesic: Oxycodone IR 10 mg, 1 tab PO q 6 hrs (40 mg/day of oxycodone) ?MME/day: 60 mg/day.  ? ?Monitoring: ?[place] PMP: PDMP reviewed during this encounter.       ?Pharmacotherapy: No side-effects or adverse reactions reported. ?Compliance: No problems identified. ?Effectiveness: Clinically acceptable. ?Plan: Refer to "POC". UDS:  ?Summary  ?Date Value Ref Range Status  ?11/29/2020 Note  Final  ?  Comment:  ?  ==================================================================== ?[employer] Select 13 (MW) ?==================================================================== ?Test                             Result       Flag       Units ? ?Drug Present and Declared for Prescription Verification ?  Oxycodone                      3039         EXPECTED   ng/mg creat ?  Oxymorphone                    84           EXPECTED   ng/mg creat ?  Noroxycodone                   >4098        EXPECTED   ng/mg creat ?  Noroxymorphone                 24           EXPECTED   ng/mg creat ?   Sources of oxycodone are scheduled prescription medications. ?   Oxymorphone, noroxycodone, and noroxymorphone are expected ?   metabolites of oxycodone. Oxymorphone is also available as a ?   scheduled prescription medication. ? ?==================================================================== ?Test                      Result    Flag   Units      Ref Range ?  Creatinine              244              mg/dL       >=20 ?==================================================================== ?Declared Medications: ? The flagging and interpretation on this report are based on the ? following declared medications.  Unexpected results may arise from ? inaccuracies in the declared medicat

## 2021-11-18 NOTE — Progress Notes (Signed)
PROVIDER NOTE: Information contained herein reflects review and annotations entered in association with encounter. Interpretation of such information and data should be left to medically-trained personnel. Information provided to patient can be located elsewhere in the medical record under "Patient Instructions". Document created using STT-dictation technology, any transcriptional errors that may result from process are unintentional.  ?  ?Patient: Danielle [name]  Service Category: E/M  Provider: Gaspar Cola, MD  ?DOB: 26-Jul-1960  DOS: 11/21/2021  Specialty: Interventional Pain Management  ?MRN: [id number]  Setting: Ambulatory outpatient  PCP: Danae Orleans, MD  ?Type: Established Patient    Referring Provider: Danae Orleans, MD  ?Location: Office  Delivery: Face-to-face    ? ?HPI  ?[name], a 61 y.o. year old female, is here today because of her Chronic pain syndrome [G89.4]. Ms. Hannen primary complain today is Hip Pain (right) ?Last encounter: My last encounter with her was on 10/09/2021. ?Pertinent problems: Ms. Bugh has Cervical spinal cord compression [facility]) (April, 2017); Cervical spinal stenosis; Chronic shoulder pain (1ry area of Pain) (Bilateral) (R>L); Chronic low back pain (3ry area of Pain) (Bilateral) (R>L) w/ sciatica (Right); Lumbar facet syndrome (Bilateral) (R>L); Lumbar spondylosis; Failed back surgical syndrome; Chronic lower extremity pain (Right); Chronic neck pain (Bilateral) (R>L); Chronic cervical radicular pain (Right); Ulnar neuropathy (Left); Hx of cervical spine surgery; Cervical spondylosis; Fibromyalgia; Chronic sacroiliac pain (Bilateral) (L>R); History of total arthroplasty of shoulder (Left); Chronic pain syndrome; Complaints of weakness of lower extremity; Anterolisthesis of lumbar spine (L4/L5); Cervical facet syndrome ([place]); Chronic musculoskeletal pain; Muscle spasm of back; Myofascial pain; Chronic hip pain (2ry area of Pain) (Bilateral) (R>L);  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); Trigger point with back pain; Osteoarthritis of shoulder (Bilateral); Spondylosis without myelopathy or radiculopathy, lumbosacral region; Cellulitis of leg, right; Chronic trochanteric bursitis (Bilateral); Osteoarthritis involving multiple joints; Chronic shoulder pain after replacement; History of total shoulder replacement (Right); History of total replacement of shoulder joints (Bilateral); Leg edema, left; Cervical spondylosis with myelopathy and radiculopathy; Chronic hip pain (Right); Osteoarthritis of hip (Right); Chronic sacroiliac joint pain (Left); Other spondylosis, sacral and sacrococcygeal region; Sacroiliac joint dysfunction (Left); Somatic dysfunction of sacroiliac joint (Left); Enthesopathy of sacroiliac joint (Left); DDD (degenerative disc disease), cervical; DDD (degenerative disc disease), lumbosacral; Other intervertebral disc degeneration, lumbar region; Acetabular labrum tear, sequela (Left); Acetabular labrum tear, sequela (Right); Greater trochanteric bursitis of hips (Bilateral); Epidural fibrosis (Left: L5-S1); Chronic groin pain (Bilateral); Abnormal MRI, lumbar spine (09/28/2020); Abnormal MRI, hip (Bilateral) (10/04/2020); Chronic shoulder pain (Left); Cervicalgia; Chronic neck pain with history of cervical spinal surgery; Chronic hip pain (Left); and Chronic groin pain (Left) on their pertinent problem list. ?Pain Assessment: Severity of Chronic pain is reported as a 4 /10. Location: Hip Right/right upper leg. Onset: More than a month ago. Quality: Constant. Timing: Constant. Modifying factor(s): heat, icy/hot, ice. ?Vitals:  height is '5\' 5"'$  (1.651 m) and weight is 230 lb (104.3 kg). Her temporal temperature is 97.2 ?F (36.2 ?C) (abnormal). Her blood pressure is 130/86 and her pulse is 96. Her respiration is 16 and oxygen saturation is 100%.  ? ?Reason for encounter: medication management.  The patient indicates doing well with the current  medication regimen. No adverse reactions or side effects reported to the medications.   ? ?The patient indicates having a flareup of her hip pain bilaterally.  She refers that the worst 1 is the right side.  However, she is hurting on both hips.  She does have a history of chronic  bilateral trochanteric bursitis as well as bilateral hip osteoarthritis with MRI evidence of labral tears, bilaterally.  Today she has requested that we do bilateral hip joint injections.  Based on her pathology, the plan is to do bilateral intra-articular hip joint injections entering at the top of the joint to help with some of the pain from the labral tears, as well and this doing bilateral trochanteric bursa injections.  Since I will be out of medication for the next 2 weeks, we will be scheduling this with Dr. Gillis Santa.  The patient has requested some sedation for the procedure.  In the past we have done these with 2 mg of IV Versed. ? ?The patient is also scheduled to have an evaluation for her hip problem by [facility] orthopedics on May 16.  She refers that she is scheduled with Meryle Ready, PA-C for the initial evaluation. ? ?In addition, the patient indicates that she has continued to have problems with her left shoulder.  She has had her shoulder surgeries by Dr. Wynelle Bourgeois [facility].  She refers having a CT scan of the left shoulder scheduled today and an ultrasound tomorrow.  She refers that she has been told that the problem is a "wire". ? ?To assist with the patient's flareup of her pain, today we have provided her with a Toradol/Norflex IM injection (60/60 mg).  Although the patient's medical records indicate that she has an allergy to NSAIDs, she refers that it is more of an intolerance and it involves simply swelling of her feet.  She refers that it is primarily with chronic oral intake of NSAIDs.  She refers having had Toradol in the past without any problems. ? ?UDS ordered today.  ? ?RTCB: 03/07/2022 ?Nonopioids transferred  05/15/2020: Zanaflex and Voltaren gel ? ?Pharmacotherapy Assessment  ?Analgesic: Oxycodone IR 10 mg, 1 tab PO q 6 hrs (40 mg/day of oxycodone) ?MME/day: 60 mg/day.  ? ?Monitoring: ?[place] PMP: PDMP reviewed during this encounter.       ?Pharmacotherapy: No side-effects or adverse reactions reported. ?Compliance: No problems identified. ?Effectiveness: Clinically acceptable. ? ?[name] RN  11/21/2021 10:37 AM  Sign when Signing Visit ?Pre-procedure instructions given. ? ?Landis Martins, RN  11/21/2021 10:03 AM  Sign when Signing Visit ?Nursing Pain Medication Assessment:  ?Safety precautions to be maintained throughout the outpatient stay will include: orient to surroundings, keep bed in low position, maintain call bell within reach at all times, provide assistance with transfer out of bed and ambulation.  ?Medication Inspection Compliance: Pill count conducted under aseptic conditions, in front of the patient. Neither the pills nor the bottle was removed from the patient's sight at any time. Once count was completed pills were immediately returned to the patient in their original bottle. ? ?Medication: Oxycodone IR ?Pill/Patch Count:  69 of 120 pills remain ?Pill/Patch Appearance: Markings consistent with prescribed medication ?Bottle Appearance: Standard pharmacy container. Clearly labeled. ?Filled Date: 04 / 24 / 2023 ?Last Medication intake:  Today ?   UDS:  ?Summary  ?Date Value Ref Range Status  ?11/29/2020 Note  Final  ?  Comment:  ?  ==================================================================== ?[employer]ox[employer] Select 13 (MW) ?==================================================================== ?Test                             Result       Flag       Units ? ?Drug Present and Declared for Prescription Verification ?  Oxycodone  3039         EXPECTED   ng/mg creat ?  Oxymorphone                    84           EXPECTED   ng/mg creat ?  Noroxycodone                   >4098         EXPECTED   ng/mg creat ?  Noroxymorphone                 24           EXPECTED   ng/mg creat ?   Sources of oxycodone are scheduled prescription medications. ?   Oxymorphone, noroxycodone, and noroxymorphone a

## 2021-11-21 ENCOUNTER — Encounter: Payer: Self-pay | Admitting: Pain Medicine

## 2021-11-21 ENCOUNTER — Ambulatory Visit: Payer: Medicaid Other | Attending: Pain Medicine | Admitting: Pain Medicine

## 2021-11-21 VITALS — BP 130/86 | HR 96 | Temp 97.2°F | Resp 16 | Ht 65.0 in | Wt 230.0 lb

## 2021-11-21 DIAGNOSIS — Z79899 Other long term (current) drug therapy: Secondary | ICD-10-CM | POA: Diagnosis present

## 2021-11-21 DIAGNOSIS — G8929 Other chronic pain: Secondary | ICD-10-CM | POA: Diagnosis present

## 2021-11-21 DIAGNOSIS — M5441 Lumbago with sciatica, right side: Secondary | ICD-10-CM | POA: Insufficient documentation

## 2021-11-21 DIAGNOSIS — Z79891 Long term (current) use of opiate analgesic: Secondary | ICD-10-CM | POA: Diagnosis present

## 2021-11-21 DIAGNOSIS — Z96619 Presence of unspecified artificial shoulder joint: Secondary | ICD-10-CM | POA: Insufficient documentation

## 2021-11-21 DIAGNOSIS — Z9889 Other specified postprocedural states: Secondary | ICD-10-CM | POA: Diagnosis present

## 2021-11-21 DIAGNOSIS — M25512 Pain in left shoulder: Secondary | ICD-10-CM | POA: Diagnosis present

## 2021-11-21 DIAGNOSIS — M542 Cervicalgia: Secondary | ICD-10-CM | POA: Diagnosis present

## 2021-11-21 DIAGNOSIS — G894 Chronic pain syndrome: Secondary | ICD-10-CM | POA: Diagnosis present

## 2021-11-21 DIAGNOSIS — M160 Bilateral primary osteoarthritis of hip: Secondary | ICD-10-CM | POA: Insufficient documentation

## 2021-11-21 DIAGNOSIS — M7062 Trochanteric bursitis, left hip: Secondary | ICD-10-CM | POA: Insufficient documentation

## 2021-11-21 DIAGNOSIS — G8928 Other chronic postprocedural pain: Secondary | ICD-10-CM | POA: Insufficient documentation

## 2021-11-21 DIAGNOSIS — M25551 Pain in right hip: Secondary | ICD-10-CM | POA: Insufficient documentation

## 2021-11-21 DIAGNOSIS — M25552 Pain in left hip: Secondary | ICD-10-CM | POA: Insufficient documentation

## 2021-11-21 DIAGNOSIS — M25511 Pain in right shoulder: Secondary | ICD-10-CM | POA: Insufficient documentation

## 2021-11-21 DIAGNOSIS — M25519 Pain in unspecified shoulder: Secondary | ICD-10-CM | POA: Insufficient documentation

## 2021-11-21 DIAGNOSIS — R935 Abnormal findings on diagnostic imaging of other abdominal regions, including retroperitoneum: Secondary | ICD-10-CM | POA: Diagnosis present

## 2021-11-21 DIAGNOSIS — M545 Low back pain, unspecified: Secondary | ICD-10-CM | POA: Diagnosis present

## 2021-11-21 DIAGNOSIS — M7061 Trochanteric bursitis, right hip: Secondary | ICD-10-CM | POA: Diagnosis present

## 2021-11-21 MED ORDER — OXYCODONE HCL 10 MG PO TABS: 10.0000 mg | ORAL_TABLET | Freq: Four times a day (QID) | ORAL | 0 refills | Status: DC | PRN

## 2021-11-21 MED ORDER — ORPHENADRINE CITRATE 30 MG/ML IJ SOLN
60.0000 mg | Freq: Once | INTRAMUSCULAR | Status: AC
  Administered 2021-11-21: 60 mg via INTRAMUSCULAR
  Filled 2021-11-21: qty 2

## 2021-11-21 MED ORDER — KETOROLAC TROMETHAMINE 60 MG/2ML IM SOLN
60.0000 mg | Freq: Once | INTRAMUSCULAR | Status: AC
  Administered 2021-11-21: 60 mg via INTRAMUSCULAR
  Filled 2021-11-21: qty 2

## 2021-11-21 NOTE — Patient Instructions (Signed)
____________________________________________________________________________________________ ? ?Pharmacy Shortages of Pain Medication  ? ?Introduction ?Shockingly as it may seem, ? ? ??No [place] Supreme Court decision has ever interpreted the Constitution as guaranteeing a right to health care for all Americans.? ?- https://www.healthequityandpolicylab.com/elusive-right-to-health-care-under-us-law ? ??With respect to human rights, the [place] has no formally codified right to health, nor does it participate in a human rights treaty that specifies a right to health.? ?- Scott J. Schweikart, JD, MBE ? ?Situation ?By now, most of our patients have had the experience of being told by their pharmacist that they do not have enough medication to cover their prescription. If you have not had this experience, just know that you soon will. ? ?Problem ?There appears to be a shortage of these medications, either at the national level or locally. This is happening with all pharmacies. When there is not enough medication, patients are offered a partial fill and they are told that they will try to get the rest of the medicine for them at a later time. If they do not have enough for even a partial fill, the pharmacists are telling the patients to call us (the prescribing physicians) to request that we send another prescription to another pharmacy to get the medicine.  ? ?This reordering of a controlled substance creates documentation problems where additional paperwork needs to be created to explain why two prescriptions for the same period of time and the same medicine are being prescribed to the same patient. It also creates situations where the last appointment note does not accurately reflect when and what prescriptions were given to a patient. This leads to prescribing errors down the line, in subsequent follow-up visits.  ? ?[place] Board of Pharmacy ([place]BOP) ?Research revealed that Board of Pharmacy Rule .1806 (21  [place]AC 46.1806) authorizes pharmacists to the transfer of prescriptions among pharmacies, and it sets forth procedural and recordkeeping requirements for doing so. However, this requires the pharmacist to complete the previously mentioned procedural paperwork to accomplish the transfer. As it turns out, it is much easier for them to have the prescribing physicians do the work.  ? ?Possible solutions ?1. Have the [place] Assembly add a provision to the "STOP ACT" (the law that mandates how controlled substances are prescribed) where there is an exception to the electronic prescribing rule that states that in the event there are shortages of medications the physicians are allowed to use written prescriptions as opposed to electronic ones. This would allow patients to take their prescriptions to a different pharmacy that may have enough medication available to fill the prescription. The problem is that currently there is a law that does not allow for written prescriptions, with the exception of instances where the electronic medical record is down due to technical issues.  ?2. Have [place] Congress ease the pressure on pharmaceutical companies, allowing them to produce enough quantities of the medication to adequately supply the population. ?3. Have pharmacies keep enough stocks of these medications to cover their client base.  ?4. Have the [place] Assembly add a provision to the "STOP ACT" where they ease the regulations surrounding the transfer of controlled substances between pharmacies, so as to simplify the transfer of supplies. As an alternative, develop a system to allow patients to obtain the remainder of their prescription at another one of their pharmacies or at an associate pharmacy.  ? ?How this shortage will affect you.  ?The one thing that is abundantly clear is that this is a pharmacy supply   problem  and not a prescriber problem. The job of the prescriber is to evaluate and monitor the  patients for the appropriate indications to the use of these medicines, the monitoring of their use and the prescribing of the appropriate dose and regimen. It is not the job of the prescriber to provide or dispense the actual medication. By law, this is the job of the pharmacies and pharmacists. It is certainly not the job of the prescriber to solve the supply problems.  ? ?Due to the above problems we are no longer taking patients to write for their pain medication. We will continue to evaluate for appropriate indications and we may provide recommendations regarding medication, dose, and schedule, as well as monitoring recommendations, however, we will not be taking over the actual prescribing of these substances. On those patients where we are treating their chronic pain with interventional therapies, exceptions will be considered on a case by case basis. At this time, we will try to continue providing this supplemental service to those patients we have been managing in the past. However, as of August 1st, 2023, we no longer will be sending additional prescriptions to other pharmacies for the purpose of solving their supply problems. Once we send a prescription to a pharmacy, we will not be resending it again to another pharmacy to cover for their shortages.  ? ?What to do. ?Write as many letters as you can. Recruit the help of family members in writing these letters. Below are some of the places where you can write to make your voice heard. Let them know what the problem is and push them to look for solutions.  ? ?Search internet for: ?[place] find your legislators? ?https://www.ncleg.gov/findyourlegislators ? ?Search internet for: ?[employer] insurance commissioner complaints? ?https://www.ncdoi.gov/contactscomplaints/assistance-or-file-complaint ? ?Search internet for: ?[place] Board of Pharmacy complaints? ?http://www.ncbop.org/contact.htm ? ?Search internet for: ?[facility] pharmacy  complaints? ?Email [facility] Customer Relations ?https://www.[facility].com/help/email-customer-relations.jsp?callType=store ? ?Search internet for: ?[facility] pharmacy customer service complaints? ?https://www.[facility].com/topic/marketing/contactus/contactus_customerservice.jsp ? ?____________________________________________________________________________________________ ? ____________________________________________________________________________________________ ? ?Medication Rules ? ?Purpose: To inform patients, and their family members, of our rules and regulations. ? ?Applies to: All patients receiving prescriptions (written or electronic). ? ?Pharmacy of record: Pharmacy where electronic prescriptions will be sent. If written prescriptions are taken to a different pharmacy, please inform the nursing staff. The pharmacy listed in the electronic medical record should be the one where you would like electronic prescriptions to be sent. ? ?Electronic prescriptions: In compliance with the [place] Strengthen Opioid Misuse Prevention (STOP) Act of 2017 (Session Law 2017-74/H243), effective July 15, 2018, all controlled substances must be electronically prescribed. Calling prescriptions to the pharmacy will cease to exist. ? ?Prescription refills: Only during scheduled appointments. Applies to all prescriptions. ? ?NOTE: The following applies primarily to controlled substances (Opioid* Pain Medications).  ? ?Type of encounter (visit): For patients receiving controlled substances, face-to-face visits are required. (Not an option or up to the patient.) ? ?Patient's responsibilities: ?Pain Pills: Bring all pain pills to every appointment (except for procedure appointments). ?Pill Bottles: Bring pills in original pharmacy bottle. Always bring the newest bottle. Bring bottle, even if empty. ?Medication refills: You are responsible for knowing and keeping track of what medications you take and those you need  refilled. ?The day before your appointment: write a list of all prescriptions that need to be refilled. ?The day of the appointment: give the list to the admitting nurse. Prescriptions will be written only during appoint

## 2021-11-21 NOTE — Progress Notes (Signed)
Pre procedure instructions given

## 2021-11-21 NOTE — Progress Notes (Signed)
Nursing Pain Medication Assessment:  ?Safety precautions to be maintained throughout the outpatient stay will include: orient to surroundings, keep bed in low position, maintain call bell within reach at all times, provide assistance with transfer out of bed and ambulation.  ?Medication Inspection Compliance: Pill count conducted under aseptic conditions, in front of the patient. Neither the pills nor the bottle was removed from the patient's sight at any time. Once count was completed pills were immediately returned to the patient in their original bottle. ? ?Medication: Oxycodone IR ?Pill/Patch Count:  69 of 120 pills remain ?Pill/Patch Appearance: Markings consistent with prescribed medication ?Bottle Appearance: Standard pharmacy container. Clearly labeled. ?Filled Date: 04 / 24 / 2023 ?Last Medication intake:  Today ?

## 2021-11-26 ENCOUNTER — Telehealth: Payer: Self-pay

## 2021-11-26 NOTE — Telephone Encounter (Signed)
Message left for the patient to call back. The patient is due for their Colonoscopy. Their last colonoscopy was done 02/05/17 by Dr Bary Castilla. Dr Bary Castilla is no longer at our office and non of our providers do this service. We would like to know if you would like for [place] to send a referral to [facility] to have them contact you to get this scheduled or if you would like to return to Dr Bary Castilla who is now working at [employer].  ? ?

## 2021-11-28 LAB — TOXASSURE SELECT 13 (MW), URINE

## 2021-12-03 ENCOUNTER — Ambulatory Visit
Admission: RE | Admit: 2021-12-03 | Discharge: 2021-12-03 | Disposition: A | Discharge status: Home / Self Care | Payer: Medicaid Other | Source: Ambulatory Visit | Attending: Student in an Organized Health Care Education/Training Program | Admitting: Student in an Organized Health Care Education/Training Program

## 2021-12-03 ENCOUNTER — Ambulatory Visit (HOSPITAL_BASED_OUTPATIENT_CLINIC_OR_DEPARTMENT_OTHER): Payer: Medicaid Other | Admitting: Student in an Organized Health Care Education/Training Program

## 2021-12-03 ENCOUNTER — Encounter: Payer: Self-pay | Admitting: Student in an Organized Health Care Education/Training Program

## 2021-12-03 DIAGNOSIS — M7061 Trochanteric bursitis, right hip: Secondary | ICD-10-CM | POA: Insufficient documentation

## 2021-12-03 DIAGNOSIS — M25551 Pain in right hip: Secondary | ICD-10-CM | POA: Diagnosis present

## 2021-12-03 DIAGNOSIS — G8929 Other chronic pain: Secondary | ICD-10-CM | POA: Insufficient documentation

## 2021-12-03 DIAGNOSIS — M7062 Trochanteric bursitis, left hip: Secondary | ICD-10-CM | POA: Diagnosis present

## 2021-12-03 DIAGNOSIS — R935 Abnormal findings on diagnostic imaging of other abdominal regions, including retroperitoneum: Secondary | ICD-10-CM | POA: Insufficient documentation

## 2021-12-03 DIAGNOSIS — M160 Bilateral primary osteoarthritis of hip: Secondary | ICD-10-CM | POA: Insufficient documentation

## 2021-12-03 DIAGNOSIS — M25552 Pain in left hip: Secondary | ICD-10-CM

## 2021-12-03 MED ORDER — MIDAZOLAM HCL 5 MG/5ML IJ SOLN
0.5000 mg | Freq: Once | INTRAMUSCULAR | Status: AC
  Administered 2021-12-03: 1.5 mg via INTRAVENOUS

## 2021-12-03 MED ORDER — IOHEXOL 180 MG/ML  SOLN
INTRAMUSCULAR | Status: AC
  Filled 2021-12-03: qty 20

## 2021-12-03 MED ORDER — METHYLPREDNISOLONE ACETATE 40 MG/ML IJ SUSP
INTRAMUSCULAR | Status: AC
Start: 2021-12-03 — End: ?
  Filled 2021-12-03: qty 2

## 2021-12-03 MED ORDER — LIDOCAINE HCL 2 % IJ SOLN
INTRAMUSCULAR | Status: AC
  Filled 2021-12-03: qty 20

## 2021-12-03 MED ORDER — METHYLPREDNISOLONE ACETATE 40 MG/ML IJ SUSP
40.0000 mg | Freq: Once | INTRAMUSCULAR | Status: AC
  Administered 2021-12-03: 40 mg

## 2021-12-03 MED ORDER — ROPIVACAINE HCL 2 MG/ML IJ SOLN
INTRAMUSCULAR | Status: AC
  Filled 2021-12-03: qty 20

## 2021-12-03 MED ORDER — LIDOCAINE HCL 2 % IJ SOLN
20.0000 mL | Freq: Once | INTRAMUSCULAR | Status: AC
  Administered 2021-12-03: 400 mg

## 2021-12-03 MED ORDER — MIDAZOLAM HCL 5 MG/5ML IJ SOLN
INTRAMUSCULAR | Status: AC
  Filled 2021-12-03: qty 5

## 2021-12-03 MED ORDER — LACTATED RINGERS IV SOLN: 1000.0000 mL | Freq: Once | INTRAVENOUS | Status: DC

## 2021-12-03 MED ORDER — ROPIVACAINE HCL 2 MG/ML IJ SOLN
9.0000 mL | Freq: Once | INTRAMUSCULAR | Status: AC
  Administered 2021-12-03: 20 mL via INTRA_ARTICULAR

## 2021-12-03 NOTE — Progress Notes (Signed)
1123 Versed 1.5 mg given IV

## 2021-12-03 NOTE — Progress Notes (Signed)
PROVIDER NOTE: Interpretation of information contained herein should be left to medically-trained personnel. Specific patient instructions are provided elsewhere under "Patient Instructions" section of medical record. This document was created in part using STT-dictation technology, any transcriptional errors that may result from this process are unintentional.  Patient: Danielle [name] Type: Established DOB: 1961/06/13 MRN: [id number] PCP: Danielle Orleans, MD  Service: Procedure DOS: 12/03/2021 Setting: Ambulatory Location: Ambulatory outpatient facility Delivery: Face-to-face Provider: Gillis Santa, MD Specialty: Interventional Pain Management Specialty designation: 09 Location: Outpatient facility Ref. Prov.: Danielle Pointer, MD    Primary Reason for Visit: Interventional Pain Management Treatment. CC: Hip Pain (bilateral) and Groin Pain (left)    Procedure:          Anesthesia, Analgesia, Anxiolysis:  Type: Intra-Articular Hip Injection  Primary Purpose: Therapeutic Region: Anterolateral hip joint area. Level: Lower pelvic and hip joint level. Target Area: Superior aspect of the hip joint cavity, going thru the superior portion of the capsular ligament. Approach: Anterior approach. Laterality: Bilateral  Anesthesia: Local (1-2% Lidocaine)  Anxiolysis: IV Versed 1.5 mg x1 Guidance: Fluoroscopy           Position: Supine Prepped Area: Entire Anterolateral hip area. DuraPrep (Iodine Povacrylex [0.7% available iodine] and Isopropyl Alcohol, 74% w/w)   1. Chronic hip pain (2ry area of Pain) (Bilateral) (R>L)   2. Abnormal MRI, hip (Bilateral) (10/04/2020)   3. 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)   4. Greater trochanteric bursitis of hips (Bilateral)    NAS-11 Pain score:   Pre-procedure: 5 /10   Post-procedure:  (right-r, left, 1)/10     Pre-op H&P Assessment:  Danielle [name] is a 61 y.o. (year old), female patient, seen today for interventional treatment. She  has a  past surgical history that includes necksurgery (10/21/2014); Back surgery (2013); Foot surgery (Right); Elbow surgery (Left); Carpal tunnel release (Right); Nose surgery; Partial hysterectomy; Total shoulder replacement (Bilateral); and Colonoscopy with propofol (N/A, 02/05/2017). Danielle [name] has a current medication list which includes the following prescription(s): albuterol, azelastine hcl, cetirizine, cholecalciferol, citalopram, docusate sodium, guaifenesin, levothyroxine, magnesium hydroxide, airborne elderberry, naloxone, nystatin, [START ON 02/05/2022] oxycodone hcl, [START ON 12/07/2021] oxycodone hcl, [START ON 01/06/2022] oxycodone hcl, trelegy ellipta, diclofenac sodium, and tizanidine, and the following Facility-Administered Medications: lactated ringers and midazolam. Her primarily concern today is the Hip Pain (bilateral) and Groin Pain (left)  Initial Vital Signs:  Pulse/HCG Rate: 88ECG Heart Rate: 74 Temp: (!) 97.1 F (36.2 C) Resp: 17 BP: (!) 141/93 SpO2: 99 %  BMI: Estimated body mass index is 38.27 kg/m as calculated from the following:   Height as of this encounter: '5\' 5"'$  (1.651 m).   Weight as of this encounter: 230 lb (104.3 kg).  Risk Assessment: Allergies: Reviewed. She is allergic to aripiprazole, duloxetine, gabapentin, gold, iodinated contrast media, naproxen sodium, nortriptyline hcl, nsaids, pregabalin, meperidine, ace inhibitors, aspirin, cefpodoxime, fluoxetine, fluticasone-salmeterol, levofloxacin, lithium, paroxetine, tape, telithromycin, theophylline, topiramate, trazodone, triamcinolone, venlafaxine, bupropion, ketorolac tromethamine, morphine, and moxifloxacin.  Allergy Precautions: None required Coagulopathies: Reviewed. None identified.  Blood-thinner therapy: None at this time Active Infection(s): Reviewed. None identified. Danielle [name] is afebrile  Site Confirmation: Danielle [name] was asked to confirm the procedure and laterality before marking the  site Procedure checklist: Completed Consent: Before the procedure and under the influence of no sedative(s), amnesic(s), or anxiolytics, the patient was informed of the treatment options, risks and possible complications. To fulfill our ethical and legal obligations, as recommended by the American Medical Association's Code of Ethics, I have  informed the patient of my clinical impression; the nature and purpose of the treatment or procedure; the risks, benefits, and possible complications of the intervention; the alternatives, including doing nothing; the risk(s) and benefit(s) of the alternative treatment(s) or procedure(s); and the risk(s) and benefit(s) of doing nothing. The patient was provided information about the general risks and possible complications associated with the procedure. These may include, but are not limited to: failure to achieve desired goals, infection, bleeding, organ or nerve damage, allergic reactions, paralysis, and death. In addition, the patient was informed of those risks and complications associated to the procedure, such as failure to decrease pain; infection; bleeding; organ or nerve damage with subsequent damage to sensory, motor, and/or autonomic systems, resulting in permanent pain, numbness, and/or weakness of one or several areas of the body; allergic reactions; (i.e.: anaphylactic reaction); and/or death. Furthermore, the patient was informed of those risks and complications associated with the medications. These include, but are not limited to: allergic reactions (i.e.: anaphylactic or anaphylactoid reaction(s)); adrenal axis suppression; blood sugar elevation that in diabetics may result in ketoacidosis or comma; water retention that in patients with history of congestive heart failure may result in shortness of breath, pulmonary edema, and decompensation with resultant heart failure; weight gain; swelling or edema; medication-induced neural toxicity; particulate matter  embolism and blood vessel occlusion with resultant organ, and/or nervous system infarction; and/or aseptic necrosis of one or more joints. Finally, the patient was informed that Medicine is not an exact science; therefore, there is also the possibility of unforeseen or unpredictable risks and/or possible complications that may result in a catastrophic outcome. The patient indicated having understood very clearly. We have given the patient no guarantees and we have made no promises. Enough time was given to the patient to ask questions, all of which were answered to the patient's satisfaction. Ms. Mynhier has indicated that she wanted to continue with the procedure. Attestation: I, the ordering provider, attest that I have discussed with the patient the benefits, risks, side-effects, alternatives, likelihood of achieving goals, and potential problems during recovery for the procedure that I have provided informed consent. Date  Time: 12/03/2021 10:34 AM  Pre-Procedure Preparation:  Monitoring: As per clinic protocol. Respiration, ETCO2, SpO2, BP, heart rate and rhythm monitor placed and checked for adequate function Safety Precautions: Patient was assessed for positional comfort and pressure points before starting the procedure. Time-out: I initiated and conducted the "Time-out" before starting the procedure, as per protocol. The patient was asked to participate by confirming the accuracy of the "Time Out" information. Verification of the correct person, site, and procedure were performed and confirmed by me, the nursing staff, and the patient. "Time-out" conducted as per Joint Commission's Universal Protocol (UP.01.01.01). Time: 1134  Description of Procedure:          Safety Precautions: Aspiration looking for blood return was conducted prior to all injections. At no point did we inject any substances, as a needle was being advanced. No attempts were made at seeking any paresthesias. Safe injection  practices and needle disposal techniques used. Medications properly checked for expiration dates. SDV (single dose vial) medications used. Description of the Procedure: Protocol guidelines were followed. The patient was placed in position over the fluoroscopy table. The target area was identified and the area prepped in the usual manner. Skin & deeper tissues infiltrated with local anesthetic. Appropriate amount of time allowed to pass for local anesthetics to take effect. The procedure needles were then advanced to the target  area. Proper needle placement secured. Negative aspiration confirmed. Solution injected in intermittent fashion, asking for systemic symptoms every 0.5cc of injectate. The needles were then removed and the area cleansed, making sure to leave some of the prepping solution back to take advantage of its long term bactericidal properties. Vitals:   12/03/21 1043 12/03/21 1141 12/03/21 1146  BP: (!) 141/93 (!) 133/91 (!) 132/92  Pulse: 88    Resp:  17 15  Temp: (!) 97.1 F (36.2 C)    SpO2: 99% 97% 98%  Weight: 230 lb (104.3 kg)    Height: '5\' 5"'$  (1.651 m)      Start Time: 1135 hrs. End Time: 1146 hrs. Materials:  Needle(s) Type: Spinal Needle Gauge: 22G Length: 5-in Medication(s): Please see orders for medications and dosing details.  5 cc solution made of 4 cc of 0.2% ropivacaine, 1 cc of methylprednisolone, 40 mg/cc.  Injected into the left hip joint. 5 cc solution made of 4 cc of 0.2% ropivacaine, 1 cc of methylprednisolone, 40 mg/cc.  Injected into the right hip joint.  Imaging Guidance (Non-Spinal):          Type of Imaging Technique: Fluoroscopy Guidance (Non-Spinal) Indication(s): Assistance in needle guidance and placement for procedures requiring needle placement in or near specific anatomical locations not easily accessible without such assistance. Exposure Time: Please see nurses notes. Contrast: None used. Fluoroscopic Guidance: I was personally present  during the use of fluoroscopy. "Tunnel Vision Technique" used to obtain the best possible view of the target area. Parallax error corrected before commencing the procedure. "Direction-depth-direction" technique used to introduce the needle under continuous pulsed fluoroscopy. Once target was reached, antero-posterior, oblique, and lateral fluoroscopic projection used confirm needle placement in all planes. Images permanently stored in EMR. Interpretation: No contrast injected.  Antibiotic Prophylaxis:   Anti-infectives (From admission, onward)    None      Indication(s): None identified  Post-operative Assessment:  Post-procedure Vital Signs:  Pulse/HCG Rate: 8875 Temp:  (!) 97.1 F (36.2 C) Resp: 15 BP:  (!) 132/92 SpO2: 98 %  EBL: None  Complications: No immediate post-treatment complications observed by team, or reported by patient.  Note: The patient tolerated the entire procedure well. A repeat set of vitals were taken after the procedure and the patient was kept under observation following institutional policy, for this type of procedure. Post-procedural neurological assessment was performed, showing return to baseline, prior to discharge. The patient was provided with post-procedure discharge instructions, including a section on how to identify potential problems. Should any problems arise concerning this procedure, the patient was given instructions to immediately contact us, at any time, without hesitation. In any case, we plan to contact the patient by telephone for a follow-up status report regarding this interventional procedure.  Comments:  No additional relevant information.  Plan of Care   Angie is also complaining of buttock and groin pain.  I told her that this could be related to her SI joint and/or her piriformis.  I offered to work this up via SI joint x-ray however she declined.  She states that she will discuss this further with Dr. [name] when she follows up.   She may benefit from a diagnostic SI joint and/or piriformis injection.   Orders:  Orders Placed This Encounter  Procedures   DG [facility] C-ARM 1-60 MIN NO REPORT    Intraoperative interpretation by procedural physician at [facility].    Standing Status:   Standing    Number of  Occurrences:   1    Order Specific Question:   Reason for exam:    Answer:   Assistance in needle guidance and placement for procedures requiring needle placement in or near specific anatomical locations not easily accessible without such assistance.    Medications ordered for procedure: Meds ordered this encounter  Medications   lidocaine (XYLOCAINE) 2 % (with pres) injection 400 mg   lactated ringers infusion 1,000 mL   ropivacaine (PF) 2 mg/mL (0.2%) (NAROPIN) injection 9 mL   methylPREDNISolone acetate (DEPO-MEDROL) injection 40 mg   methylPREDNISolone acetate (DEPO-MEDROL) injection 40 mg   midazolam (VERSED) 5 MG/5ML injection 0.5-2 mg    Make sure Flumazenil is available in the pyxis when using this medication. If oversedation occurs, administer 0.2 mg IV over 15 sec. If after 45 sec no response, administer 0.2 mg again over 1 min; may repeat at 1 min intervals; not to exceed 4 doses (1 mg)   Medications administered: We administered lidocaine, ropivacaine (PF) 2 mg/mL (0.2%), methylPREDNISolone acetate, and methylPREDNISolone acetate.  See the medical record for exact dosing, route, and time of administration.  Follow-up plan:   Return for Keep sch. appt with Dr Consuela Mimes.      Recent Visits Date Type Provider Dept  11/21/21 Office Visit Danielle Pointer, MD [facility]  10/09/21 Office Visit Danielle Pointer, MD [facility]  09/18/21 Procedure visit Danielle Pointer, MD [facility]  09/05/21 Office Visit Danielle Pointer, MD [facility]  Showing recent visits within past 90 days and meeting all other requirements Today's Visits Date  Type Provider Dept  12/03/21 Procedure visit Danielle Santa, MD [facility]  Showing today's visits and meeting all other requirements Future Appointments Date Type Provider Dept  02/27/22 Appointment Danielle Pointer, MD [facility]  Showing future appointments within next 90 days and meeting all other requirements  Disposition: Discharge home  Discharge (Date  Time): 12/03/2021; 1200 hrs.   Primary Care Physician: Danielle Orleans, MD Location: [facility] Note by: Danielle Santa, MD Date: 12/03/2021; Time: 1:03 PM  Disclaimer:  Medicine is not an exact science. The only guarantee in medicine is that nothing is guaranteed. It is important to note that the decision to proceed with this intervention was based on the information collected from the patient. The Data and conclusions were drawn from the patient's questionnaire, the interview, and the physical examination. Because the information was provided in large part by the patient, it cannot be guaranteed that it has not been purposely or unconsciously manipulated. Every effort has been made to obtain as much relevant data as possible for this evaluation. It is important to note that the conclusions that lead to this procedure are derived in large part from the available data. Always take into account that the treatment will also be dependent on availability of resources and existing treatment guidelines, considered by other Pain Management Practitioners as being common knowledge and practice, at the time of the intervention. For Medico-Legal purposes, it is also important to point out that variation in procedural techniques and pharmacological choices are the acceptable norm. The indications, contraindications, technique, and results of the above procedure should only be interpreted and judged by a Board-Certified Interventional Pain Specialist with extensive familiarity and expertise in the same  exact procedure and technique.

## 2021-12-03 NOTE — Patient Instructions (Signed)

## 2021-12-03 NOTE — Progress Notes (Signed)
Safety precautions to be maintained throughout the outpatient stay will include: orient to surroundings, keep bed in low position, maintain call bell within reach at all times, provide assistance with transfer out of bed and ambulation.  

## 2021-12-04 ENCOUNTER — Telehealth: Payer: Self-pay

## 2021-12-04 NOTE — Telephone Encounter (Signed)
Post procedure phone call.  Patient states she is doing well.  

## 2021-12-05 ENCOUNTER — Telehealth: Payer: Self-pay | Admitting: Pain Medicine

## 2021-12-05 NOTE — Telephone Encounter (Signed)
Patient stated that her meds is on back order at [facility].Patient wants to try [facility] on [address] in [place]. Please give patient a call. Thanks

## 2021-12-06 ENCOUNTER — Other Ambulatory Visit: Payer: Self-pay | Admitting: *Deleted

## 2021-12-06 DIAGNOSIS — Z79899 Other long term (current) drug therapy: Secondary | ICD-10-CM

## 2021-12-06 DIAGNOSIS — G8929 Other chronic pain: Secondary | ICD-10-CM

## 2021-12-06 DIAGNOSIS — G894 Chronic pain syndrome: Secondary | ICD-10-CM

## 2021-12-06 DIAGNOSIS — Z96619 Presence of unspecified artificial shoulder joint: Secondary | ICD-10-CM

## 2021-12-06 DIAGNOSIS — Z79891 Long term (current) use of opiate analgesic: Secondary | ICD-10-CM

## 2021-12-06 DIAGNOSIS — G8928 Other chronic postprocedural pain: Secondary | ICD-10-CM

## 2021-12-06 MED ORDER — OXYCODONE HCL 10 MG PO TABS: 10.0000 mg | ORAL_TABLET | Freq: Four times a day (QID) | ORAL | 0 refills | Status: DC | PRN

## 2022-01-02 ENCOUNTER — Other Ambulatory Visit: Payer: Self-pay | Admitting: *Deleted

## 2022-01-02 ENCOUNTER — Telehealth: Payer: Self-pay

## 2022-01-02 DIAGNOSIS — G8928 Other chronic postprocedural pain: Secondary | ICD-10-CM

## 2022-01-02 DIAGNOSIS — Z79891 Long term (current) use of opiate analgesic: Secondary | ICD-10-CM

## 2022-01-02 DIAGNOSIS — Z79899 Other long term (current) drug therapy: Secondary | ICD-10-CM

## 2022-01-02 DIAGNOSIS — G8929 Other chronic pain: Secondary | ICD-10-CM

## 2022-01-02 DIAGNOSIS — G894 Chronic pain syndrome: Secondary | ICD-10-CM

## 2022-01-02 DIAGNOSIS — M25519 Pain in unspecified shoulder: Secondary | ICD-10-CM

## 2022-01-02 MED ORDER — OXYCODONE HCL 10 MG PO TABS: 10.0000 mg | ORAL_TABLET | Freq: Four times a day (QID) | ORAL | 0 refills | Status: DC | PRN

## 2022-01-02 NOTE — Telephone Encounter (Signed)
[facility] doesn't have her [facility]. Can you please send to [facility] on [address]. She doesn't have June or July scripts. She wants someone to call her and let her know it has been done.

## 2022-01-02 NOTE — Telephone Encounter (Signed)
Medication refill request sent to [facility]  

## 2022-01-05 MED ORDER — OXYCODONE HCL 10 MG PO TABS: 10.0000 mg | ORAL_TABLET | Freq: Four times a day (QID) | ORAL | 0 refills | Status: DC | PRN

## 2022-01-16 ENCOUNTER — Telehealth: Payer: Self-pay | Admitting: Pain Medicine

## 2022-01-16 NOTE — Telephone Encounter (Signed)
Needs eval appt please.

## 2022-01-16 NOTE — Telephone Encounter (Signed)
Patient called stating she is having burning, stinging pain in her left thigh at the top, into her groin and around to her back. Doesn't know what is causing this. Should she get a procedure or come in, (next appt is 01-23-22 at 3pm. Please call patient to assess and advise

## 2022-01-17 ENCOUNTER — Encounter: Payer: Self-pay | Admitting: Pain Medicine

## 2022-01-17 DIAGNOSIS — F1490 Cocaine use, unspecified, uncomplicated: Secondary | ICD-10-CM | POA: Insufficient documentation

## 2022-01-17 DIAGNOSIS — R892 Abnormal level of other drugs, medicaments and biological substances in specimens from other organs, systems and tissues: Secondary | ICD-10-CM | POA: Insufficient documentation

## 2022-01-17 NOTE — Telephone Encounter (Signed)
Scheduled patient for Monday 01-21-22 at 2pm/ spoke with patient and confirmed

## 2022-01-20 NOTE — Progress Notes (Unsigned)
PROVIDER NOTE: Information contained herein reflects review and annotations entered in association with encounter. Interpretation of such information and data should be left to medically-trained personnel. Information provided to patient can be located elsewhere in the medical record under "Patient Instructions". Document created using STT-dictation technology, any transcriptional errors that may result from process are unintentional.    Patient: Danielle [name]  Service Category: E/M  Provider: Gaspar Cola, MD  DOB: 07-31-1960  DOS: 01/21/2022  Specialty: Interventional Pain Management  MRN: [id number]  Setting: Ambulatory outpatient  PCP: Danae Orleans, MD  Type: Established Patient    Referring Provider: Danae Orleans, MD  Location: Office  Delivery: Face-to-face     HPI  [name], a 61 y.o. year old female, is here today because of her No primary diagnosis found.. Ms. Gibby primary complain today is No chief complaint on file. Last encounter: My last encounter with her was on 01/16/2022. Pertinent problems: Ms. Carreras has Cervical spinal cord compression [facility]) (April, 2017); Cervical spinal stenosis; Chronic shoulder pain (1ry area of Pain) (Bilateral) (R>L); Chronic low back pain (3ry area of Pain) (Bilateral) (R>L) w/ sciatica (Right); Lumbar facet syndrome (Bilateral) (R>L); Lumbar spondylosis; Failed back surgical syndrome; Chronic lower extremity pain (Right); Chronic neck pain (Bilateral) (R>L); Chronic cervical radicular pain (Right); Ulnar neuropathy (Left); Hx of cervical spine surgery; Cervical spondylosis; Fibromyalgia; Chronic sacroiliac pain (Bilateral) (L>R); History of total arthroplasty of shoulder (Left); Chronic pain syndrome; Complaints of weakness of lower extremity; Anterolisthesis of lumbar spine (L4/L5); Cervical facet syndrome ([place]); Chronic musculoskeletal pain; Muscle spasm of back; Myofascial pain; Chronic hip pain (2ry area of Pain) (Bilateral)  (R>L); 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); Trigger point with back pain; Osteoarthritis of shoulder (Bilateral); Spondylosis without myelopathy or radiculopathy, lumbosacral region; Cellulitis of leg, right; Chronic trochanteric bursitis (Bilateral); Osteoarthritis involving multiple joints; Chronic shoulder pain after replacement; History of total shoulder replacement (Right); History of total replacement of shoulder joints (Bilateral); Leg edema, left; Cervical spondylosis with myelopathy and radiculopathy; Chronic hip pain (Right); Osteoarthritis of hip (Right); Chronic sacroiliac joint pain (Left); Other spondylosis, sacral and sacrococcygeal region; Sacroiliac joint dysfunction (Left); Somatic dysfunction of sacroiliac joint (Left); Enthesopathy of sacroiliac joint (Left); DDD (degenerative disc disease), cervical; DDD (degenerative disc disease), lumbosacral; Other intervertebral disc degeneration, lumbar region; Acetabular labrum tear, sequela (Left); Acetabular labrum tear, sequela (Right); Greater trochanteric bursitis of hips (Bilateral); Epidural fibrosis (Left: L5-S1); Chronic groin pain (Bilateral); Abnormal MRI, lumbar spine (09/28/2020); Abnormal MRI, hip (Bilateral) (10/04/2020); Chronic shoulder pain (Left); Cervicalgia; Chronic neck pain with history of cervical spinal surgery; Chronic hip pain (Left); Chronic groin pain (Left); and Abnormal drug screen (11/21/2021) on their pertinent problem list. Pain Assessment: Severity of   is reported as a  /10. Location:    / . Onset:  . Quality:  . Timing:  . Modifying factor(s):  [place] Kitchen Vitals:  vitals were not taken for this visit.   Reason for encounter:  *** . ***  Pharmacotherapy Assessment  Analgesic: Oxycodone IR 10 mg, 1 tab PO q 6 hrs (40 mg/day of oxycodone) MME/day: 60 mg/day.   Monitoring: [place] PMP: PDMP reviewed during this encounter.       Pharmacotherapy: No side-effects or adverse reactions reported. Compliance: No problems  identified. Effectiveness: Clinically acceptable.  No notes on file  UDS:  Summary  Date Value Ref Range Status  11/21/2021 Note  Final    Comment:    ==================================================================== [employer] [facility] (MW) ==================================================================== Test  Result       Flag       Units  Drug Present and Declared for Prescription Verification   Oxycodone                      >2433        EXPECTED   ng/mg creat   Oxymorphone                    164          EXPECTED   ng/mg creat   Noroxycodone                   >2433        EXPECTED   ng/mg creat   Noroxymorphone                 58           EXPECTED   ng/mg creat    Sources of oxycodone are scheduled prescription medications.    Oxymorphone, noroxycodone, and noroxymorphone are expected    metabolites of oxycodone. Oxymorphone is also available as a    scheduled prescription medication.  Drug Present not Declared for Prescription Verification   Methamphetamine                12           UNEXPECTED ng/mg creat    Sources of methamphetamine include illicit sources, as a scheduled    prescription medication, as a metabolite of some prescription drugs,    or use of an l-methamphetamine inhaler.    Oxazepam                       17           UNEXPECTED ng/mg creat    Oxazepam may be administered as a scheduled prescription medication;    it is also an expected metabolite of other benzodiazepine drugs,    including diazepam, chlordiazepoxide, prazepam, clorazepate,    halazepam, and temazepam.    Benzoylecgonine                38           UNEXPECTED ng/mg creat    Benzoylecgonine is a metabolite of cocaine; its presence indicates    use of this drug.  Source is most commonly illicit, but cocaine is    present in some topical anesthetic solutions.  ==================================================================== Test                       Result    Flag   Units      Ref Range   Creatinine              411              mg/dL      >=20 ==================================================================== Declared Medications:  The flagging and interpretation on this report are based on the  following declared medications.  Unexpected results may arise from  inaccuracies in the declared medications.   **Note: The testing scope of this panel includes these medications:   Oxycodone   **Note: The testing scope of this panel does not include the  following reported medications:   Albuterol  Azelastine  Cetirizine  Citalopram (Celexa)  Diclofenac (Voltaren)  Docusate (Colace)  Fluticasone (Trelegy)  Guaifenesin (Mucinex)  Levothyroxine (Synthroid)  Naloxone  Nystatin  Supplement  Tizanidine (Zanaflex)  Umeclidinium (Trelegy)  Vilanterol (Trelegy)  Vitamin D ==================================================================== For clinical consultation, please call [phone]. ====================================================================      ROS  Constitutional: Denies any fever or chills Gastrointestinal: No reported hemesis, hematochezia, vomiting, or acute GI distress Musculoskeletal: Denies any acute onset joint swelling, redness, loss of ROM, or weakness Neurological: No reported episodes of acute onset apraxia, aphasia, dysarthria, agnosia, amnesia, paralysis, loss of coordination, or loss of consciousness  Medication Review  Airborne Elderberry, Azelastine HCl, Fluticasone-Umeclidin-Vilant, Oxycodone HCl, albuterol, cetirizine, cholecalciferol, citalopram, diclofenac Sodium, docusate sodium, guaiFENesin, levothyroxine, magnesium hydroxide, naloxone, nystatin, and tiZANidine  History Review  Allergy: Ms. Gillyard is allergic to aripiprazole, duloxetine, gabapentin, gold, iodinated contrast media, naproxen sodium, nortriptyline hcl, nsaids, pregabalin, meperidine, ace inhibitors, aspirin,  cefpodoxime, fluoxetine, fluticasone-salmeterol, levofloxacin, lithium, paroxetine, tape, telithromycin, theophylline, topiramate, trazodone, triamcinolone, venlafaxine, bupropion, ketorolac tromethamine, morphine, and moxifloxacin. Drug: Ms. Forney  reports no history of drug use. Alcohol:  reports no history of alcohol use. Tobacco:  reports that she has been smoking cigarettes. She has a 80.00 pack-year smoking history. She has never used smokeless tobacco. Social: Ms. Bieser  reports that she has been smoking cigarettes. She has a 80.00 pack-year smoking history. She has never used smokeless tobacco. She reports that she does not drink alcohol and does not use drugs. Medical:  has a past medical history of Acute postoperative pain (01/13/2017), Anxiety, Arthritis, Asthma, Bell's palsy, Bursitis, COPD (chronic obstructive pulmonary disease) ([place]), Depression, Fibromyalgia, Heart murmur, Hepatitis C, Hiatal hernia, Hyperlipidemia, Hypertension, IBS (irritable bowel syndrome), Insomnia, and Thyroid disease. Surgical: Ms. Scarboro  has a past surgical history that includes necksurgery (10/21/2014); Back surgery (2013); Foot surgery (Right); Elbow surgery (Left); Carpal tunnel release (Right); Nose surgery; Partial hysterectomy; Total shoulder replacement (Bilateral); and Colonoscopy with propofol (N/A, 02/05/2017). Family: family history includes Cancer in her mother; Gout in her mother; Heart disease in her father.  Laboratory Chemistry Profile   Renal Lab Results  Component Value Date   BUN 14 04/28/2019   CREATININE 0.98 04/28/2019   BCR 13 02/16/2019   GFRAA >60 04/28/2019   GFRNONAA >60 04/28/2019    Hepatic Lab Results  Component Value Date   AST 18 04/28/2019   ALT 16 04/28/2019   ALBUMIN 3.7 04/28/2019   ALKPHOS 80 04/28/2019    Electrolytes Lab Results  Component Value Date   NA 139 04/28/2019   K 4.1 04/28/2019   CL 105 04/28/2019   CALCIUM 9.3 04/28/2019   MG 2.1 02/16/2019     Bone Lab Results  Component Value Date   25OHVITD1 27 (L) 02/16/2019   25OHVITD2 2.0 02/16/2019   25OHVITD3 25 02/16/2019    Inflammation (CRP: Acute Phase) (ESR: Chronic Phase) Lab Results  Component Value Date   CRP 2 02/16/2019   ESRSEDRATE 23 02/16/2019         Note: Above Lab results reviewed.  Recent Imaging Review  DG PAIN CLINIC C-ARM 1-60 MIN NO REPORT Fluoro was used, but no Radiologist interpretation will be provided.  Please refer to "NOTES" tab for provider progress note. Note: Reviewed        Physical Exam  General appearance: Well nourished, well developed, and well hydrated. In no apparent acute distress Mental status: Alert, oriented x 3 (person, place, & time)       Respiratory: No evidence of acute respiratory distress Eyes: PERLA Vitals: There were no vitals taken for this visit. BMI: Estimated body mass index is 38.27 kg/m as calculated from the following:  Height as of 12/03/21: '5\' 5"'  (1.651 m).   Weight as of 12/03/21: 230 lb (104.3 kg). Ideal: Patient weight not recorded  Assessment   Diagnosis Status  No diagnosis found. Controlled Controlled Controlled   Updated Problems: No problems updated.  Plan of Care  Problem-specific:  No problem-specific Assessment & Plan notes found for this encounter.  Ms. LAKASHA MCFALL has a current medication list which includes the following long-term medication(s): albuterol, azelastine hcl, citalopram, diclofenac sodium, levothyroxine, [START ON 02/05/2022] oxycodone hcl, oxycodone hcl, oxycodone hcl, and tizanidine.  Pharmacotherapy (Medications Ordered): No orders of the defined types were placed in this encounter.  Orders:  No orders of the defined types were placed in this encounter.  Follow-up plan:   No follow-ups on file.     Interventional Therapies  Risk  Complexity Considerations:   Estimated body mass index is 38.27 kg/m as calculated from the following:   Height as of this  encounter: '5\' 5"'  (1.651 m).   Weight as of this encounter: 230 lb (104.3 kg). 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  Allergic to NSAIDs.  Abnormal UDS (01/02/2016). (+) Unreported tramadol.   Planned  Pending:   Diagnostic/therapeutic right cervical facet MBB #2   Under consideration:   Possible bilateral hip joint RFA  Diagnostic caudal ESI + epidurogram  Possible Racz procedure.  Possible bilateral cervical facet RFA   Diagnostic right CESI  Diagnostic left sacroiliac joint block  Possible bilateral sacroiliac joint RFA    Completed:   Palliative bilateral trochanteric bursa x1  Palliative right suprascapular NB x3 (08/08/2020)  Palliative left suprascapular NB x4 (02/22/2021) Palliative bilateral suprascapular nerve RFA x1 (05/13/2019) Diagnostic right IA hip joint injection x3 (06/01/2020)  Diagnostic left IA hip joint injection x2 (04/30/2018)  Diagnostic bilateral lumbar facet MBB x3  Palliative right lumbar facet RFA x2 (12/16/2017)  Palliative left lumbar facet RFA x2 (02/12/2018)  Diagnostic bilateral cervical facet MBB x1  Diagnostic right SI joint block x1    Therapeutic  Palliative (PRN) options:   Palliative bilateral trochanteric bursa #2  Palliative bilateral suprascapular nerve block #4  Palliative bilateral suprascapular nerve RFA #2  Diagnostic right IA hip joint injection #4  Diagnostic left IA hip joint injection #3  Diagnostic bilateral lumbar facet block #4  Palliative right lumbar facet RFA #3  Palliative left lumbar facet RFA #3  Diagnostic bilateral cervical facet block #2  Diagnostic right SI joint block #2      Recent Visits Date Type Provider Dept  12/03/21 Procedure visit Gillis Santa, MD [facility]  11/21/21 Office Visit Milinda Pointer, MD [facility]  Showing recent visits within past 90 days and meeting all other requirements Future Appointments Date Type Provider Dept  01/21/22 Appointment  Milinda Pointer, [facility]  02/27/22 Appointment Milinda Pointer, MD [facility]  Showing future appointments within next 90 days and meeting all other requirements  I discussed the assessment and treatment plan with the patient. The patient was provided an opportunity to ask questions and all were answered. The patient agreed with the plan and demonstrated an understanding of the instructions.  Patient advised to call back or seek an in-person evaluation if the symptoms or condition worsens.  Duration of encounter: *** minutes.  Total time on encounter, as per AMA guidelines included both the face-to-face and non-face-to-face time personally spent by the physician and/or other qualified health care professional(s) on the day of the encounter (includes time in activities  that require the physician or other qualified health care professional and does not include time in activities normally performed by clinical staff). Physician's time may include the following activities when performed: preparing to see the patient (eg, review of tests, pre-charting review of records) obtaining and/or reviewing separately obtained history performing a medically appropriate examination and/or evaluation counseling and educating the patient/family/caregiver ordering medications, tests, or procedures referring and communicating with other health care professionals (when not separately reported) documenting clinical information in the electronic or other health record independently interpreting results (not separately reported) and communicating results to the patient/ family/caregiver care coordination (not separately reported)  Note by: Gaspar Cola, MD Date: 01/21/2022; Time: 2:53 PM

## 2022-01-21 ENCOUNTER — Ambulatory Visit: Payer: Medicaid Other | Attending: Pain Medicine | Admitting: Pain Medicine

## 2022-01-21 ENCOUNTER — Other Ambulatory Visit: Payer: Self-pay

## 2022-01-21 ENCOUNTER — Encounter: Payer: Self-pay | Admitting: Pain Medicine

## 2022-01-21 VITALS — BP 135/81 | HR 77 | Temp 97.7°F | Resp 16 | Ht 65.0 in | Wt 228.0 lb

## 2022-01-21 DIAGNOSIS — Z79891 Long term (current) use of opiate analgesic: Secondary | ICD-10-CM | POA: Insufficient documentation

## 2022-01-21 DIAGNOSIS — Z9889 Other specified postprocedural states: Secondary | ICD-10-CM | POA: Insufficient documentation

## 2022-01-21 DIAGNOSIS — M5137 Other intervertebral disc degeneration, lumbosacral region: Secondary | ICD-10-CM | POA: Diagnosis present

## 2022-01-21 DIAGNOSIS — M542 Cervicalgia: Secondary | ICD-10-CM | POA: Diagnosis present

## 2022-01-21 DIAGNOSIS — Z96619 Presence of unspecified artificial shoulder joint: Secondary | ICD-10-CM | POA: Diagnosis present

## 2022-01-21 DIAGNOSIS — R937 Abnormal findings on diagnostic imaging of other parts of musculoskeletal system: Secondary | ICD-10-CM | POA: Diagnosis present

## 2022-01-21 DIAGNOSIS — M5441 Lumbago with sciatica, right side: Secondary | ICD-10-CM | POA: Insufficient documentation

## 2022-01-21 DIAGNOSIS — M25519 Pain in unspecified shoulder: Secondary | ICD-10-CM | POA: Diagnosis present

## 2022-01-21 DIAGNOSIS — G894 Chronic pain syndrome: Secondary | ICD-10-CM | POA: Insufficient documentation

## 2022-01-21 DIAGNOSIS — F1490 Cocaine use, unspecified, uncomplicated: Secondary | ICD-10-CM | POA: Diagnosis present

## 2022-01-21 DIAGNOSIS — G8929 Other chronic pain: Secondary | ICD-10-CM | POA: Diagnosis present

## 2022-01-21 DIAGNOSIS — R1032 Left lower quadrant pain: Secondary | ICD-10-CM | POA: Insufficient documentation

## 2022-01-21 DIAGNOSIS — R892 Abnormal level of other drugs, medicaments and biological substances in specimens from other organs, systems and tissues: Secondary | ICD-10-CM | POA: Diagnosis present

## 2022-01-21 DIAGNOSIS — M4316 Spondylolisthesis, lumbar region: Secondary | ICD-10-CM | POA: Diagnosis present

## 2022-01-21 DIAGNOSIS — M25512 Pain in left shoulder: Secondary | ICD-10-CM | POA: Diagnosis present

## 2022-01-21 DIAGNOSIS — Z91148 Patient's other noncompliance with medication regimen for other reason: Secondary | ICD-10-CM | POA: Diagnosis present

## 2022-01-21 DIAGNOSIS — M25511 Pain in right shoulder: Secondary | ICD-10-CM | POA: Insufficient documentation

## 2022-01-21 DIAGNOSIS — M25552 Pain in left hip: Secondary | ICD-10-CM | POA: Diagnosis present

## 2022-01-21 DIAGNOSIS — R935 Abnormal findings on diagnostic imaging of other abdominal regions, including retroperitoneum: Secondary | ICD-10-CM | POA: Diagnosis not present

## 2022-01-21 DIAGNOSIS — S73192S Other sprain of left hip, sequela: Secondary | ICD-10-CM | POA: Insufficient documentation

## 2022-01-21 DIAGNOSIS — G8928 Other chronic postprocedural pain: Secondary | ICD-10-CM | POA: Insufficient documentation

## 2022-01-21 DIAGNOSIS — M25551 Pain in right hip: Secondary | ICD-10-CM | POA: Insufficient documentation

## 2022-01-21 DIAGNOSIS — M79652 Pain in left thigh: Secondary | ICD-10-CM | POA: Diagnosis present

## 2022-01-21 DIAGNOSIS — Z79899 Other long term (current) drug therapy: Secondary | ICD-10-CM | POA: Diagnosis present

## 2022-01-21 MED ORDER — OXYCODONE HCL 5 MG PO TABS: 5.0000 mg | ORAL_TABLET | Freq: Two times a day (BID) | ORAL | 0 refills | Status: DC

## 2022-01-21 MED ORDER — OXYCODONE HCL 5 MG PO TABS: 5.0000 mg | ORAL_TABLET | Freq: Every day | ORAL | 0 refills | Status: DC

## 2022-01-21 MED ORDER — OXYCODONE HCL 5 MG PO TABS: ORAL_TABLET | ORAL | 0 refills | Status: DC

## 2022-01-21 MED ORDER — OXYCODONE HCL 5 MG PO TABS: 5.0000 mg | ORAL_TABLET | Freq: Three times a day (TID) | ORAL | 0 refills | Status: DC

## 2022-01-21 MED ORDER — OXYCODONE HCL 5 MG PO TABS: 5.0000 mg | ORAL_TABLET | Freq: Four times a day (QID) | ORAL | 0 refills | Status: DC

## 2022-01-21 NOTE — Progress Notes (Signed)
Safety precautions to be maintained throughout the outpatient stay will include: orient to surroundings, keep bed in low position, maintain call bell within reach at all times, provide assistance with transfer out of bed and ambulation.  

## 2022-01-21 NOTE — Patient Instructions (Signed)
____________________________________________________________________________________________  Drug Holidays (Slow)  What is a "Drug Holiday"? Drug Holiday: is the name given to the period of time during which a patient stops taking a medication(s) for the purpose of eliminating tolerance to the drug.  Benefits . Improved effectiveness of opioids. . Decreased opioid dose needed to achieve benefits. . Improved pain with lesser dose.  What is tolerance? Tolerance: is the progressive decreased in effectiveness of a drug due to its repetitive use. With repetitive use, the body gets use to the medication and as a consequence, it loses its effectiveness. This is a common problem seen with opioid pain medications. As a result, a larger dose of the drug is needed to achieve the same effect that used to be obtained with a smaller dose.  How long should a "Drug Holiday" last? You should stay off of the pain medicine for at least 14 consecutive days. (2 weeks)  Should I stop the medicine "cold turkey"? No. You should always coordinate with your Pain Specialist so that he/she can provide you with the correct medication dose to make the transition as smoothly as possible.  How do I stop the medicine? Slowly. You will be instructed to decrease the daily amount of pills that you take by one (1) pill every seven (7) days. This is called a "slow downward taper" of your dose. For example: if you normally take four (4) pills per day, you will be asked to drop this dose to three (3) pills per day for seven (7) days, then to two (2) pills per day for seven (7) days, then to one (1) per day for seven (7) days, and at the end of those last seven (7) days, this is when the "Drug Holiday" would start.   Will I have withdrawals? By doing a "slow downward taper" like this one, it is unlikely that you will experience any significant withdrawal symptoms. Typically, what triggers withdrawals is the sudden stop of a high  dose opioid therapy. Withdrawals can usually be avoided by slowly decreasing the dose over a prolonged period of time. If you do not follow these instructions and decide to stop your medication abruptly, withdrawals may be possible.  What are withdrawals? Withdrawals: refers to the wide range of symptoms that occur after stopping or dramatically reducing opiate drugs after heavy and prolonged use. Withdrawal symptoms do not occur to patients that use low dose opioids, or those who take the medication sporadically. Contrary to benzodiazepine (example: Valium, Xanax, etc.) or alcohol withdrawals ("Delirium Tremens"), opioid withdrawals are not lethal. Withdrawals are the physical manifestation of the body getting rid of the excess receptors.  Expected Symptoms Early symptoms of withdrawal may include: . Agitation . Anxiety . Muscle aches . Increased tearing . Insomnia . Runny nose . Sweating . Yawning  Late symptoms of withdrawal may include: . Abdominal cramping . Diarrhea . Dilated pupils . Goose bumps . Nausea . Vomiting  Will I experience withdrawals? Due to the slow nature of the taper, it is very unlikely that you will experience any.  What is a slow taper? Taper: refers to the gradual decrease in dose.  (Last update: 02/02/2020) ____________________________________________________________________________________________     

## 2022-01-31 ENCOUNTER — Encounter: Payer: Self-pay | Admitting: Pain Medicine

## 2022-01-31 ENCOUNTER — Ambulatory Visit
Admission: RE | Admit: 2022-01-31 | Discharge: 2022-01-31 | Disposition: A | Discharge status: Home / Self Care | Payer: Medicaid Other | Source: Ambulatory Visit | Attending: Pain Medicine | Admitting: Pain Medicine

## 2022-01-31 ENCOUNTER — Ambulatory Visit: Payer: Medicaid Other | Attending: Pain Medicine | Admitting: Pain Medicine

## 2022-01-31 VITALS — BP 112/67 | HR 83 | Temp 97.4°F | Resp 18 | Ht 65.0 in | Wt 228.0 lb

## 2022-01-31 DIAGNOSIS — S73192S Other sprain of left hip, sequela: Secondary | ICD-10-CM | POA: Diagnosis present

## 2022-01-31 DIAGNOSIS — G8929 Other chronic pain: Secondary | ICD-10-CM | POA: Insufficient documentation

## 2022-01-31 DIAGNOSIS — S73191S Other sprain of right hip, sequela: Secondary | ICD-10-CM | POA: Insufficient documentation

## 2022-01-31 DIAGNOSIS — M79652 Pain in left thigh: Secondary | ICD-10-CM | POA: Diagnosis present

## 2022-01-31 DIAGNOSIS — Z91041 Radiographic dye allergy status: Secondary | ICD-10-CM | POA: Insufficient documentation

## 2022-01-31 DIAGNOSIS — M160 Bilateral primary osteoarthritis of hip: Secondary | ICD-10-CM | POA: Diagnosis present

## 2022-01-31 DIAGNOSIS — Z6841 Body Mass Index (BMI) 40.0 and over, adult: Secondary | ICD-10-CM | POA: Diagnosis present

## 2022-01-31 DIAGNOSIS — M7918 Myalgia, other site: Secondary | ICD-10-CM | POA: Diagnosis present

## 2022-01-31 DIAGNOSIS — M7062 Trochanteric bursitis, left hip: Secondary | ICD-10-CM | POA: Insufficient documentation

## 2022-01-31 DIAGNOSIS — Z87892 Personal history of anaphylaxis: Secondary | ICD-10-CM | POA: Diagnosis present

## 2022-01-31 DIAGNOSIS — R1032 Left lower quadrant pain: Secondary | ICD-10-CM | POA: Diagnosis present

## 2022-01-31 DIAGNOSIS — R935 Abnormal findings on diagnostic imaging of other abdominal regions, including retroperitoneum: Secondary | ICD-10-CM | POA: Diagnosis present

## 2022-01-31 DIAGNOSIS — M25551 Pain in right hip: Secondary | ICD-10-CM | POA: Diagnosis present

## 2022-01-31 DIAGNOSIS — M7061 Trochanteric bursitis, right hip: Secondary | ICD-10-CM | POA: Diagnosis present

## 2022-01-31 DIAGNOSIS — M25552 Pain in left hip: Secondary | ICD-10-CM | POA: Diagnosis present

## 2022-01-31 MED ORDER — LIDOCAINE HCL 2 % IJ SOLN
20.0000 mL | Freq: Once | INTRAMUSCULAR | Status: AC
  Administered 2022-01-31: 400 mg

## 2022-01-31 MED ORDER — FENTANYL CITRATE (PF) 100 MCG/2ML IJ SOLN
25.0000 ug | INTRAMUSCULAR | Status: DC | PRN
  Administered 2022-01-31: 100 ug via INTRAVENOUS

## 2022-01-31 MED ORDER — LACTATED RINGERS IV SOLN: Freq: Once | INTRAVENOUS | Status: AC

## 2022-01-31 MED ORDER — LIDOCAINE HCL 2 % IJ SOLN
INTRAMUSCULAR | Status: AC
  Filled 2022-01-31: qty 20

## 2022-01-31 MED ORDER — METHYLPREDNISOLONE ACETATE 40 MG/ML IJ SUSP
INTRAMUSCULAR | Status: AC
  Filled 2022-01-31: qty 1

## 2022-01-31 MED ORDER — MIDAZOLAM HCL 5 MG/5ML IJ SOLN
0.5000 mg | Freq: Once | INTRAMUSCULAR | Status: AC
  Administered 2022-01-31: 4 mg via INTRAVENOUS

## 2022-01-31 MED ORDER — METHYLPREDNISOLONE ACETATE 80 MG/ML IJ SUSP
160.0000 mg | Freq: Once | INTRAMUSCULAR | Status: AC
  Administered 2022-01-31: 160 mg via INTRA_ARTICULAR

## 2022-01-31 MED ORDER — MIDAZOLAM HCL 5 MG/5ML IJ SOLN
INTRAMUSCULAR | Status: AC
  Filled 2022-01-31: qty 5

## 2022-01-31 MED ORDER — ROPIVACAINE HCL 2 MG/ML IJ SOLN
INTRAMUSCULAR | Status: AC
  Filled 2022-01-31: qty 20

## 2022-01-31 MED ORDER — FENTANYL CITRATE (PF) 100 MCG/2ML IJ SOLN
INTRAMUSCULAR | Status: AC
  Filled 2022-01-31: qty 2

## 2022-01-31 MED ORDER — ROPIVACAINE HCL 2 MG/ML IJ SOLN
18.0000 mL | Freq: Once | INTRAMUSCULAR | Status: AC
  Administered 2022-01-31: 18 mL via INTRA_ARTICULAR

## 2022-01-31 NOTE — Progress Notes (Signed)
PROVIDER NOTE: Interpretation of information contained herein should be left to medically-trained personnel. Specific patient instructions are provided elsewhere under "Patient Instructions" section of medical record. This document was created in part using STT-dictation technology, any transcriptional errors that may result from this process are unintentional.  Patient: Danielle [name] Type: Established DOB: [birth year] MRN: [id number] PCP: Danae Orleans, MD  Service: Procedure DOS: 01/31/2022 Setting: Ambulatory Location: Ambulatory outpatient facility Delivery: Face-to-face Provider: Gaspar Cola, MD Specialty: Interventional Pain Management Specialty designation: 09 Location: Outpatient facility Ref. Prov.: Milinda Pointer, MD    Primary Reason for Visit: Interventional Pain Management Treatment. CC: Hip Pain (bilateral)  Procedure:               Type:  Intra-articular hip & peri-articular bursae injection  B2 R4 L5   Laterality: Bilateral (-50)  Level: Lower pelvic and hip joint level.  Imaging: Fluoroscopy-guided Anesthesia: Local anesthesia (1-2% Lidocaine) Anxiolysis: IV Versed         Sedation: Moderate conscious sedation. DOS: 01/31/2022  Performed by: Gaspar Cola, MD  Purpose: Diagnostic/Therapeutic Indications: Hip pain severe enough to impact quality of life or function. Rationale (medical necessity): procedure needed and proper for the diagnosis and/or treatment of Ms. Kugel's medical symptoms and needs. 1. Chronic hip pain (2ry area of Pain) (Bilateral) (R>L)   2. Acetabular labrum tear, sequela (Left)   3. Acetabular labrum tear, sequela (Right)   4. Chronic trochanteric bursitis (Bilateral)   5. Greater trochanteric bursitis of hips (Bilateral)   6. 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)   7. Abnormal MRI, hip (Bilateral) (10/04/2020)    History of allergy to iodine & radiographic dye    History of drug-induced anaphylaxis (Contrast Dye)     Morbid obesity with BMI of 40.0-44.9, adult (HCC)    NAS-11 Pain score:   Pre-procedure: 4 /10   Post-procedure: 0-No pain/10      Intra-articular Target: Superior aspect of the hip joint cavity, going thru the superior portion of the capsular ligament.  Bursae Target: Trochanteric Bursa Location: Intra-articular & peri-articular Region: Hip joint, upper (proximal) femoral region Approach: Percutaneous posterolateral approach. Type of procedure: Percutaneous joint injection   Position / Prep / Materials:  Position: Prone  Prep solution: DuraPrep (Iodine Povacrylex [0.7% available iodine] and Isopropyl Alcohol, 74% w/w) Prep Area:  Entire Posterolateral hip area. Materials:  Tray: Block tray Needle(s):  Type: Spinal  Gauge (G): 22  Length: 7-in  Qty: 2  Imaging Guidance (Non-Spinal):          Type of Imaging Technique: Fluoroscopy Guidance (Non-Spinal) Indication(s): Assistance in needle guidance and placement for procedures requiring needle placement in or near specific anatomical locations not easily accessible without such assistance. Exposure Time: Please see nurses notes. Contrast: Before injecting any contrast, we confirmed that the patient did not have an allergy to iodine, shellfish, or radiological contrast. Once satisfactory needle placement was completed at the desired level, radiological contrast was injected. Contrast injected under live fluoroscopy. No contrast complications. See chart for type and volume of contrast used. Fluoroscopic Guidance: I was personally present during the use of fluoroscopy. "Tunnel Vision Technique" used to obtain the best possible view of the target area. Parallax error corrected before commencing the procedure. "Direction-depth-direction" technique used to introduce the needle under continuous pulsed fluoroscopy. Once target was reached, antero-posterior, oblique, and lateral fluoroscopic projection used confirm needle placement in all  planes. Images permanently stored in EMR. Interpretation: I personally interpreted the imaging intraoperatively. Adequate needle placement confirmed  in multiple planes. Appropriate spread of contrast into desired area was observed. No evidence of afferent or efferent intravascular uptake. Permanent images saved into the patient's record.  Pre-Procedure Preparation:  Monitoring: As per clinic protocol. Respiration, ETCO2, SpO2, BP, heart rate and rhythm monitor placed and checked for adequate function Safety Precautions: Patient was assessed for positional comfort and pressure points before starting the procedure. Time-out: I initiated and conducted the "Time-out" before starting the procedure, as per protocol. The patient was asked to participate by confirming the accuracy of the "Time Out" information. Verification of the correct person, site, and procedure were performed and confirmed by me, the nursing staff, and the patient. "Time-out" conducted as per Joint Commission's Universal Protocol (UP.01.01.01). Time: 1010  Description/Narrative of Procedure:          Rationale (medical necessity): procedure needed and proper for the diagnosis and/or treatment of the patient's medical symptoms and needs. Procedural Technique Safety Precautions: Aspiration looking for blood return was conducted prior to all injections. At no point did we inject any substances, as a needle was being advanced. No attempts were made at seeking any paresthesias. Safe injection practices and needle disposal techniques used. Medications properly checked for expiration dates. SDV (single dose vial) medications used. Description of the Procedure: Protocol guidelines were followed. The patient was assisted into a comfortable position. The target area was identified and the area prepped in the usual manner. Skin & deeper tissues infiltrated with local anesthetic. Appropriate amount of time allowed to pass for local anesthetics to take  effect. The procedure needles were then advanced to the target area. Proper needle placement secured. Negative aspiration confirmed. Solution injected in intermittent fashion, asking for systemic symptoms every 0.5cc of injectate. The needles were then removed and the area cleansed, making sure to leave some of the prepping solution back to take advantage of its long term bactericidal properties.  Technical description of procedure:  Skin & deeper tissues infiltrated with local anesthetic. Appropriate amount of time allowed to pass for local anesthetics to take effect. The procedure needles were then advanced to the target area. Proper needle placement secured. Negative aspiration confirmed. Solution injected in intermittent fashion, asking for systemic symptoms every 0.5cc of injectate. The needles were then removed and the area cleansed, making sure to leave some of the prepping solution back to take advantage of its long term bactericidal properties.           Vitals:   01/31/22 1021 01/31/22 1031 01/31/22 1040 01/31/22 1050  BP: (!) '97/52 95/61 98/62 '$ 112/67  Pulse:      Resp: '20 11 18 18  '$ Temp:  97.6 F (36.4 C)  (!) 97.4 F (36.3 C)  TempSrc:      SpO2: 100% 100% 100% 100%  Weight:      Height:         Start Time: 1010 hrs. End Time: 1021 hrs.  Post-operative Assessment:  Post-procedure Vital Signs:  Pulse/HCG Rate: 8377 Temp: (!) 97.4 F (36.3 C) Resp: 18 BP: 112/67 SpO2: 100 %  EBL: None  Complications: No immediate post-treatment complications observed by team, or reported by patient.  Note: The patient tolerated the entire procedure well. A repeat set of vitals were taken after the procedure and the patient was kept under observation following institutional policy, for this type of procedure. Post-procedural neurological assessment was performed, showing return to baseline, prior to discharge. The patient was provided with post-procedure discharge instructions,  including a section on how to identify  potential problems. Should any problems arise concerning this procedure, the patient was given instructions to immediately contact us, at any time, without hesitation. In any case, we plan to contact the patient by telephone for a follow-up status report regarding this interventional procedure.  Comments:  No additional relevant information.  Plan of Care  Orders:  Orders Placed This Encounter  Procedures   HIP INJECTION    Scheduling Instructions:     Side: Bilateral     Sedation: With Sedation.     Timeframe: Today   HIP INJECTION    Purpose: Therapeutic/Diagnostic Indication: Hip pain 2ry to Trochanteric Burlitis bilateral (M70.61, M70.62).    Scheduling Instructions:     Procedure: Trochanteric bursa injection     Laterality: Bilateral     Sedation: With Sedation.     Timeframe: Today   DG PAIN CLINIC C-ARM 1-60 MIN NO REPORT    Intraoperative interpretation by procedural physician at [place].    Standing Status:   Standing    Number of Occurrences:   1    Order Specific Question:   Reason for exam:    Answer:   Assistance in needle guidance and placement for procedures requiring needle placement in or near specific anatomical locations not easily accessible without such assistance.   Informed Consent Details: Physician/Practitioner Attestation; Transcribe to consent form and obtain patient signature    Nursing Order: Transcribe to consent form and obtain patient signature. Note: Always confirm laterality of pain with Ms. Domingo Cocking, before procedure.    Order Specific Question:   Physician/Practitioner attestation of informed consent for procedure/surgical case    Answer:   I, the physician/practitioner, attest that I have discussed with the patient the benefits, risks, side effects, alternatives, likelihood of achieving goals and potential problems during recovery for the procedure that I have provided informed consent.    Order  Specific Question:   Procedure    Answer:   Hip injection    Order Specific Question:   Physician/Practitioner performing the procedure    Answer:   Chanta Bauers A. [name], MD    Order Specific Question:   Indication/Reason    Answer:   Hip Joint Pain (Arthralgia)   Informed Consent Details: Physician/Practitioner Attestation; Transcribe to consent form and obtain patient signature    Note: Always confirm laterality of pain with Ms. Domingo Cocking, before procedure. Transcribe to consent form and obtain patient signature.    Order Specific Question:   Physician/Practitioner attestation of informed consent for procedure/surgical case    Answer:   I, the physician/practitioner, attest that I have discussed with the patient the benefits, risks, side effects, alternatives, likelihood of achieving goals and potential problems during recovery for the procedure that I have provided informed consent.    Order Specific Question:   Procedure    Answer:   Hip bursa injection    Order Specific Question:   Physician/Practitioner performing the procedure    Answer:   Jenavi Beedle A. [name], MD    Order Specific Question:   Indication/Reason    Answer:   Hip bursitis   Provide equipment / supplies at bedside    "Block Tray" (Disposable  single use) Needle type: SpinalSpinal Amount/quantity: 2 Size: Long (7-inch) Gauge: 22G    Standing Status:   Standing    Number of Occurrences:   1    Order Specific Question:   Specify    Answer:   Block Tray   Miscellanous precautions    Standing Status:   Standing  Number of Occurrences:   1   Miscellanous precautions    NOTE: Although It is true that patients can have allergies to shellfish and that shellfish contain iodine, most shellfish  allergies are due to two protein allergens present in the shellfish: tropomyosins and parvalbumin. Not all patients with shellfish allergies are allergic to iodine. However, as a precaution, avoid using iodine containing products.     Standing Status:   Standing    Number of Occurrences:   1   Chronic Opioid Analgesic:  NO OPIOIDS. (11/21/2021) UDS. (+) COCAINE. D/C'ed: Oxycodone IR 10 mg, 1 tab PO q 6 hrs (40 mg/day of oxycodone) MME/day: 60 mg/day.   Medications ordered for procedure: Meds ordered this encounter  Medications   lidocaine (XYLOCAINE) 2 % (with pres) injection 400 mg   lactated ringers infusion   midazolam (VERSED) 5 MG/5ML injection 0.5-2 mg    Make sure Flumazenil is available in the pyxis when using this medication. If oversedation occurs, administer 0.2 mg IV over 15 sec. If after 45 sec no response, administer 0.2 mg again over 1 min; may repeat at 1 min intervals; not to exceed 4 doses (1 mg)   fentaNYL (SUBLIMAZE) injection 25-50 mcg    Make sure Narcan is available in the pyxis when using this medication. In the event of respiratory depression (RR< 8/min): Titrate NARCAN (naloxone) in increments of 0.1 to 0.2 mg IV at 2-3 minute intervals, until desired degree of reversal.   ropivacaine (PF) 2 mg/mL (0.2%) (NAROPIN) injection 18 mL   methylPREDNISolone acetate (DEPO-MEDROL) injection 160 mg   Medications administered: We administered lidocaine, lactated ringers, midazolam, fentaNYL, ropivacaine (PF) 2 mg/mL (0.2%), and methylPREDNISolone acetate.  See the medical record for exact dosing, route, and time of administration.  Follow-up plan:   Return in about 2 weeks (around 02/14/2022) for Proc-day (T,Th), (F2F), (PPE).       Interventional Therapies  Risk  Complexity Considerations:   Estimated body mass index is 38.27 kg/m as calculated from the following:   Height as of this encounter: '5\' 5"'$  (1.651 m).   Weight as of this encounter: 230 lb (104.3 kg). 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  Allergic to NSAIDs.  Abnormal UDS (01/02/2016). (+) Unreported tramadol.   Planned  Pending:   Diagnostic/therapeutic right cervical facet MBB #2   Under consideration:    Diagnostic caudal ESI + epidurogram  Possible Racz procedure.  Possible bilateral cervical facet RFA   Diagnostic right CESI  Diagnostic left sacroiliac joint block  Possible bilateral sacroiliac joint RFA    Completed:   Palliative bilateral trochanteric bursa x1 (09/18/2021) (100/100/90) Palliative right suprascapular NB x3 (08/08/2020)  Palliative left suprascapular NB x4 (02/22/2021) Palliative bilateral suprascapular nerve RFA x1 (05/13/2019)  Therapeutic right IA hip joint injection x3 (06/01/2020)  Therapeutic left IA hip joint injection x4 (09/18/2021) (100/100/90) Therapeutic bilateral IA hip joint inj. x1 (12/03/2021) (by Dr. Gillis Santa)  Diagnostic bilateral lumbar facet MBB x3  Palliative right lumbar facet RFA x2 (12/16/2017)  Palliative left lumbar facet RFA x2 (02/12/2018)  Diagnostic bilateral cervical facet MBB x1  Diagnostic right SI joint block x1    Therapeutic  Palliative (PRN) options:   Palliative bilateral trochanteric bursa #2  Palliative bilateral suprascapular nerve block #4  Palliative bilateral suprascapular nerve RFA #2  Diagnostic right IA hip joint injection #4  Diagnostic left IA hip joint injection #3  Diagnostic bilateral lumbar facet block #4  Palliative right lumbar facet RFA #3  Palliative left lumbar facet RFA #3  Diagnostic bilateral cervical facet block #2  Diagnostic right SI joint block #2         Recent Visits Date Type Provider Dept  01/21/22 Office Visit Milinda Pointer, MD [facility]  12/03/21 Procedure visit Gillis Santa, MD [facility]  11/21/21 Office Visit Milinda Pointer, MD [facility]  Showing recent visits within past 90 days and meeting all other requirements Today's Visits Date Type Provider Dept  01/31/22 Procedure visit Milinda Pointer, MD [facility]  Showing today's visits and meeting all other requirements Future Appointments Date Type Provider Dept  02/14/22  Appointment Milinda Pointer, MD [facility]  02/27/22 Appointment Milinda Pointer, MD [facility]  Showing future appointments within next 90 days and meeting all other requirements  Disposition: Discharge home  Discharge (Date  Time): 01/31/2022; 1051 hrs.   Primary Care Physician: Danae Orleans, MD Location: [facility] Note by: Gaspar Cola, MD Date: 01/31/2022; Time: 11:02 AM  Disclaimer:  Medicine is not an Chief Strategy Officer. The only guarantee in medicine is that nothing is guaranteed. It is important to note that the decision to proceed with this intervention was based on the information collected from the patient. The Data and conclusions were drawn from the patient's questionnaire, the interview, and the physical examination. Because the information was provided in large part by the patient, it cannot be guaranteed that it has not been purposely or unconsciously manipulated. Every effort has been made to obtain as much relevant data as possible for this evaluation. It is important to note that the conclusions that lead to this procedure are derived in large part from the available data. Always take into account that the treatment will also be dependent on availability of resources and existing treatment guidelines, considered by other Pain Management Practitioners as being common knowledge and practice, at the time of the intervention. For Medico-Legal purposes, it is also important to point out that variation in procedural techniques and pharmacological choices are the acceptable norm. The indications, contraindications, technique, and results of the above procedure should only be interpreted and judged by a Board-Certified Interventional Pain Specialist with extensive familiarity and expertise in the same exact procedure and technique.

## 2022-01-31 NOTE — Patient Instructions (Signed)

## 2022-02-01 ENCOUNTER — Telehealth: Payer: Self-pay | Admitting: *Deleted

## 2022-02-01 NOTE — Telephone Encounter (Signed)
No problems post procedure. 

## 2022-02-05 ENCOUNTER — Ambulatory Visit: Payer: Medicaid Other | Admitting: Pain Medicine

## 2022-02-13 NOTE — Progress Notes (Unsigned)
PROVIDER NOTE: Information contained herein reflects review and annotations entered in association with encounter. Interpretation of such information and data should be left to medically-trained personnel. Information provided to patient can be located elsewhere in the medical record under "Patient Instructions". Document created using STT-dictation technology, any transcriptional errors that may result from process are unintentional.    Patient: Danielle [name]  Service Category: E/M  Provider: Gaspar Cola, MD  DOB: [birth year]  DOS: 02/14/2022  Referring Provider: Danae Orleans, MD  MRN: [id number]  Specialty: Interventional Pain Management  PCP: Danae Orleans, MD  Type: Established Patient  Setting: Ambulatory outpatient    Location: Office  Delivery: Face-to-face     HPI  Danielle [name], a 61 y.o. year old female, is here today because of her Chronic pain of both hips [M25.551, M25.552, G89.29]. [name] primary complain today is No chief complaint on file. Last encounter: My last encounter with her was on 01/31/2022. Pertinent problems: [name] has Cervical spinal cord compression [facility] (April, 2017); Cervical spinal stenosis; Chronic shoulder pain (1ry area of Pain) (Bilateral) (R>L); Chronic low back pain (3ry area of Pain) (Bilateral) (R>L) w/ sciatica (Right); Lumbar facet syndrome (Bilateral) (R>L); Lumbar spondylosis; Failed back surgical syndrome; Chronic lower extremity pain (Right); Chronic neck pain (Bilateral) (R>L); Chronic cervical radicular pain (Right); Ulnar neuropathy (Left); Hx of cervical spine surgery; Cervical spondylosis; Fibromyalgia; Chronic sacroiliac pain (Bilateral) (L>R); History of total arthroplasty of shoulder (Left); Chronic pain syndrome; Complaints of weakness of lower extremity; Anterolisthesis of lumbar spine (L4/L5); Cervical facet syndrome ([place]); Chronic musculoskeletal pain; Muscle spasm of back; Myofascial pain; Chronic hip pain (2ry area  of Pain) (Bilateral) (R>L); 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); Trigger point with back pain; Osteoarthritis of shoulder (Bilateral); Spondylosis without myelopathy or radiculopathy, lumbosacral region; Cellulitis of leg, right; Chronic trochanteric bursitis (Bilateral); Osteoarthritis involving multiple joints; Chronic shoulder pain after replacement; History of total shoulder replacement (Right); History of total replacement of shoulder joints (Bilateral); Leg edema, left; Cervical spondylosis with myelopathy and radiculopathy; Chronic hip pain (Right); Osteoarthritis of hip (Right); Chronic sacroiliac joint pain (Left); Other spondylosis, sacral and sacrococcygeal region; Sacroiliac joint dysfunction (Left); Somatic dysfunction of sacroiliac joint (Left); Enthesopathy of sacroiliac joint (Left); DDD (degenerative disc disease), cervical; DDD (degenerative disc disease), lumbosacral; Other intervertebral disc degeneration, lumbar region; Acetabular labrum tear, sequela (Left); Acetabular labrum tear, sequela (Right); Greater trochanteric bursitis of hips (Bilateral); Epidural fibrosis (Left: L5-S1); Chronic groin pain (Bilateral); Abnormal MRI, lumbar spine (09/28/2020); Abnormal MRI, hip (Bilateral) (10/04/2020); Chronic shoulder pain (Left); Cervicalgia; Chronic neck pain with history of cervical spinal surgery; Chronic hip pain (Left); Chronic groin pain (Left); Abnormal drug screen (11/21/2021); Left thigh pain; and Right buttock pain on their pertinent problem list. Pain Assessment: Severity of   is reported as a  /10. Location:    / . Onset:  . Quality:  . Timing:  . Modifying factor(s):  Marland Kitchen Vitals:  vitals were not taken for this visit.   Reason for encounter: post-procedure evaluation and assessment. ***  Post-procedure evaluation   Type:  Intra-articular hip & peri-articular bursae injection  B2 R4 L5   Laterality: Bilateral (-50)  Level: Lower pelvic and hip joint level.  Imaging:  Fluoroscopy-guided Anesthesia: Local anesthesia (1-2% Lidocaine) Anxiolysis: IV Versed         Sedation: Moderate conscious sedation. DOS: 01/31/2022  Performed by: Gaspar Cola, MD  Purpose: Diagnostic/Therapeutic Indications: Hip pain severe enough to impact quality of life or function. Rationale (  medical necessity): procedure needed and proper for the diagnosis and/or treatment of [name]'s medical symptoms and needs. 1. Chronic hip pain (2ry area of Pain) (Bilateral) (R>L)   2. Acetabular labrum tear, sequela (Left)   3. Acetabular labrum tear, sequela (Right)   4. Chronic trochanteric bursitis (Bilateral)   5. Greater trochanteric bursitis of hips (Bilateral)   6. 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)   7. Abnormal MRI, hip (Bilateral) (10/04/2020)    History of allergy to iodine & radiographic dye    History of drug-induced anaphylaxis (Contrast Dye)    Morbid obesity with BMI of 40.0-44.9, adult (HCC)    NAS-11 Pain score:   Pre-procedure: 4 /10   Post-procedure: 0-No pain/10       Effectiveness:  Initial hour after procedure:   ***. Subsequent 4-6 hours post-procedure:   ***. Analgesia past initial 6 hours:   ***. Ongoing improvement:  Analgesic:  *** Function:    ***    ROM:    ***     Pharmacotherapy Assessment  Analgesic: NO OPIOIDS. (11/21/2021) UDS. (+) COCAINE. D/C'ed: Oxycodone IR 10 mg, 1 tab PO q 6 hrs (40 mg/day of oxycodone) MME/day: 60 mg/day.   Monitoring: [place] PMP: PDMP reviewed during this encounter.       Pharmacotherapy: No side-effects or adverse reactions reported. Compliance: No problems identified. Effectiveness: Clinically acceptable.  No notes on file  UDS:  Summary  Date Value Ref Range Status  11/21/2021 Note  Final    Comment:    ==================================================================== [employer] [facility] [employer] (MW) ==================================================================== Test                              Result       Flag       Units  Drug Present and Declared for Prescription Verification   Oxycodone                      >2433        EXPECTED   ng/mg creat   Oxymorphone                    164          EXPECTED   ng/mg creat   Noroxycodone                   >2433        EXPECTED   ng/mg creat   Noroxymorphone                 58           EXPECTED   ng/mg creat    Sources of oxycodone are scheduled prescription medications.    Oxymorphone, noroxycodone, and noroxymorphone are expected    metabolites of oxycodone. Oxymorphone is also available as a    scheduled prescription medication.  Drug Present not Declared for Prescription Verification   Methamphetamine                12           UNEXPECTED ng/mg creat    Sources of methamphetamine include illicit sources, as a scheduled    prescription medication, as a metabolite of some prescription drugs,    or use of an l-methamphetamine inhaler.    Oxazepam                       17  UNEXPECTED ng/mg creat    Oxazepam may be administered as a scheduled prescription medication;    it is also an expected metabolite of other benzodiazepine drugs,    including diazepam, chlordiazepoxide, prazepam, clorazepate,    halazepam, and temazepam.    Benzoylecgonine                38           UNEXPECTED ng/mg creat    Benzoylecgonine is a metabolite of cocaine; its presence indicates    use of this drug.  Source is most commonly illicit, but cocaine is    present in some topical anesthetic solutions.  ==================================================================== Test                      Result    Flag   Units      Ref Range   Creatinine              411              mg/dL      >=20 ==================================================================== Declared Medications:  The flagging and interpretation on this report are based on the  following declared medications.  Unexpected results may arise from  inaccuracies in the declared  medications.   **Note: The testing scope of this panel includes these medications:   Oxycodone   **Note: The testing scope of this panel does not include the  following reported medications:   Albuterol  Azelastine  Cetirizine  Citalopram (Celexa)  Diclofenac (Voltaren)  Docusate (Colace)  Fluticasone (Trelegy)  Guaifenesin (Mucinex)  Levothyroxine (Synthroid)  Naloxone  Nystatin  Supplement  Tizanidine (Zanaflex)  Umeclidinium (Trelegy)  Vilanterol (Trelegy)  Vitamin D ==================================================================== For clinical consultation, please call [phone]. ====================================================================      ROS  Constitutional: Denies any fever or chills Gastrointestinal: No reported hemesis, hematochezia, vomiting, or acute GI distress Musculoskeletal: Denies any acute onset joint swelling, redness, loss of ROM, or weakness Neurological: No reported episodes of acute onset apraxia, aphasia, dysarthria, agnosia, amnesia, paralysis, loss of coordination, or loss of consciousness  Medication Review  Azelastine HCl, Fluticasone-Umeclidin-Vilant, albuterol, cetirizine, cholecalciferol, citalopram, diclofenac Sodium, docusate sodium, levothyroxine, magnesium hydroxide, naloxone, nystatin, oxyCODONE, and tiZANidine  History Review  Allergy: Ms. Slatten is allergic to aripiprazole, duloxetine, gabapentin, gold, iodinated contrast media, naproxen sodium, nortriptyline hcl, nsaids, pregabalin, meperidine, ace inhibitors, aspirin, cefpodoxime, fluoxetine, fluticasone-salmeterol, levofloxacin, lithium, paroxetine, tape, telithromycin, theophylline, topiramate, trazodone, triamcinolone, venlafaxine, bupropion, ketorolac tromethamine, morphine, and moxifloxacin. Drug: Ms. Halseth  reports no history of drug use. Alcohol:  reports no history of alcohol use. Tobacco:  reports that she has been smoking cigarettes. She has a 80.00  pack-year smoking history. She has never used smokeless tobacco. Social: Ms. Teaney  reports that she has been smoking cigarettes. She has a 80.00 pack-year smoking history. She has never used smokeless tobacco. She reports that she does not drink alcohol and does not use drugs. Medical:  has a past medical history of Acute postoperative pain (01/13/2017), Anxiety, Arthritis, Asthma, Bell's palsy, Bursitis, COPD (chronic obstructive pulmonary disease) ([place]), Depression, Fibromyalgia, Heart murmur, Hepatitis C, Hiatal hernia, Hyperlipidemia, Hypertension, IBS (irritable bowel syndrome), Insomnia, and Thyroid disease. Surgical: Ms. Fiorillo  has a past surgical history that includes necksurgery (10/21/2014); Back surgery (2013); Foot surgery (Right); Elbow surgery (Left); Carpal tunnel release (Right); Nose surgery; Partial hysterectomy; Total shoulder replacement (Bilateral); and Colonoscopy with propofol (N/A, 02/05/2017). Family: family history includes Cancer in her mother; Gout in her  mother; Heart disease in her father.  Laboratory Chemistry Profile   Renal Lab Results  Component Value Date   BUN 14 04/28/2019   CREATININE 0.98 04/28/2019   BCR 13 02/16/2019   GFRAA >60 04/28/2019   GFRNONAA >60 04/28/2019    Hepatic Lab Results  Component Value Date   AST 18 04/28/2019   ALT 16 04/28/2019   ALBUMIN 3.7 04/28/2019   ALKPHOS 80 04/28/2019    Electrolytes Lab Results  Component Value Date   NA 139 04/28/2019   K 4.1 04/28/2019   CL 105 04/28/2019   CALCIUM 9.3 04/28/2019   MG 2.1 02/16/2019    Bone Lab Results  Component Value Date   25OHVITD1 27 (L) 02/16/2019   25OHVITD2 2.0 02/16/2019   25OHVITD3 25 02/16/2019    Inflammation (CRP: Acute Phase) (ESR: Chronic Phase) Lab Results  Component Value Date   CRP 2 02/16/2019   ESRSEDRATE 23 02/16/2019         Note: Above Lab results reviewed.  Recent Imaging Review  DG PAIN CLINIC C-ARM 1-60 MIN NO REPORT Fluoro was used,  but no Radiologist interpretation will be provided.  Please refer to "NOTES" tab for provider progress note. Note: Reviewed        Physical Exam  General appearance: Well nourished, well developed, and well hydrated. In no apparent acute distress Mental status: Alert, oriented x 3 (person, place, & time)       Respiratory: No evidence of acute respiratory distress Eyes: PERLA Vitals: There were no vitals taken for this visit. BMI: Estimated body mass index is 37.94 kg/m as calculated from the following:   Height as of 01/31/22: _0  (1.651 m).   Weight as of 01/31/22: 228 lb (103.4 kg). Ideal: Ideal body weight: 57 kg (125 lb 10.6 oz) Adjusted ideal body weight: 75.6 kg (166 lb 9.6 oz)  Assessment   Diagnosis Status  1. Chronic hip pain (2ry area of Pain) (Bilateral) (R>L)   2. Acetabular labrum tear, sequela (Left)   3. Acetabular labrum tear, sequela (Right)   4. Chronic trochanteric bursitis (Bilateral)   5. 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)    Controlled Controlled Controlled   Updated Problems: No problems updated.  Plan of Care  Problem-specific:  No problem-specific Assessment & Plan notes found for this encounter.  Ms. KASHMERE DAYWALT has a current medication list which includes the following long-term medication(s): albuterol, azelastine hcl, citalopram, diclofenac sodium, levothyroxine, oxycodone, oxycodone, [START ON 02/19/2022] oxycodone, [START ON 02/26/2022] oxycodone, [START ON 03/05/2022] oxycodone, [START ON 03/12/2022] oxycodone, [START ON 03/19/2022] oxycodone, and tizanidine.  Pharmacotherapy (Medications Ordered): No orders of the defined types were placed in this encounter.  Orders:  No orders of the defined types were placed in this encounter.  Follow-up plan:   No follow-ups on file.     Interventional Therapies  Risk  Complexity Considerations:   Estimated body mass index is 38.27 kg/m as calculated from the following:   Height as of this  encounter: _1  (1.651 m).   Weight as of this encounter: 230 lb (104.3 kg). 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  Allergic to NSAIDs.  Abnormal UDS (01/02/2016). (+) Unreported tramadol.   Planned  Pending:   Diagnostic/therapeutic right cervical facet MBB #2   Under consideration:   Diagnostic caudal ESI + epidurogram  Possible Racz procedure.  Possible bilateral cervical facet RFA   Diagnostic right CESI  Diagnostic left sacroiliac joint block  Possible bilateral sacroiliac joint  RFA    Completed:   Palliative bilateral trochanteric bursa x1 (09/18/2021) (100/100/90) Palliative right suprascapular NB x3 (08/08/2020)  Palliative left suprascapular NB x4 (02/22/2021) Palliative bilateral suprascapular nerve RFA x1 (05/13/2019)  Therapeutic right IA hip joint injection x3 (06/01/2020)  Therapeutic left IA hip joint injection x4 (09/18/2021) (100/100/90) Therapeutic bilateral IA hip joint inj. x1 (12/03/2021) (by Dr. Gillis Santa)  Diagnostic bilateral lumbar facet MBB x3  Palliative right lumbar facet RFA x2 (12/16/2017)  Palliative left lumbar facet RFA x2 (02/12/2018)  Diagnostic bilateral cervical facet MBB x1  Diagnostic right SI joint block x1    Therapeutic  Palliative (PRN) options:   Palliative bilateral trochanteric bursa #2  Palliative bilateral suprascapular nerve block #4  Palliative bilateral suprascapular nerve RFA #2  Diagnostic right IA hip joint injection #4  Diagnostic left IA hip joint injection #3  Diagnostic bilateral lumbar facet block #4  Palliative right lumbar facet RFA #3  Palliative left lumbar facet RFA #3  Diagnostic bilateral cervical facet block #2  Diagnostic right SI joint block #2        Recent Visits Date Type Provider Dept  01/31/22 Procedure visit Milinda Pointer, MD [facility]  01/21/22 Office Visit Milinda Pointer, MD [facility]  12/03/21 Procedure visit Gillis Santa, MD [facility]  11/21/21 Office Visit Milinda Pointer, MD [facility]  Showing recent visits within past 90 days and meeting all other requirements Future Appointments Date Type Provider Dept  02/14/22 Appointment Milinda Pointer, MD [facility]  02/27/22 Appointment Milinda Pointer, MD [facility]  Showing future appointments within next 90 days and meeting all other requirements  I discussed the assessment and treatment plan with the patient. The patient was provided an opportunity to ask questions and all were answered. The patient agreed with the plan and demonstrated an understanding of the instructions.  Patient advised to call back or seek an in-person evaluation if the symptoms or condition worsens.  Duration of encounter: *** minutes.  Total time on encounter, as per AMA guidelines included both the face-to-face and non-face-to-face time personally spent by the physician and/or other qualified health care professional(s) on the day of the encounter (includes time in activities that require the physician or other qualified health care professional and does not include time in activities normally performed by clinical staff). Physician's time may include the following activities when performed: preparing to see the patient (eg, review of tests, pre-charting review of records) obtaining and/or reviewing separately obtained history performing a medically appropriate examination and/or evaluation counseling and educating the patient/family/caregiver ordering medications, tests, or procedures referring and communicating with other health care professionals (when not separately reported) documenting clinical information in the electronic or other health record independently interpreting results (not separately reported) and communicating results to the patient/ family/caregiver care coordination (not separately reported)  Note by: Gaspar Cola,  MD Date: 02/14/2022; Time: 11:56 AM

## 2022-02-14 ENCOUNTER — Ambulatory Visit: Payer: Medicaid Other | Attending: Pain Medicine | Admitting: Pain Medicine

## 2022-02-14 ENCOUNTER — Encounter: Payer: Self-pay | Admitting: Pain Medicine

## 2022-02-14 ENCOUNTER — Other Ambulatory Visit: Payer: Self-pay

## 2022-02-14 VITALS — BP 147/83 | HR 91 | Temp 97.1°F | Resp 16 | Ht 65.0 in | Wt 224.2 lb

## 2022-02-14 DIAGNOSIS — M542 Cervicalgia: Secondary | ICD-10-CM | POA: Insufficient documentation

## 2022-02-14 DIAGNOSIS — S73192S Other sprain of left hip, sequela: Secondary | ICD-10-CM | POA: Diagnosis not present

## 2022-02-14 DIAGNOSIS — M47812 Spondylosis without myelopathy or radiculopathy, cervical region: Secondary | ICD-10-CM | POA: Diagnosis present

## 2022-02-14 DIAGNOSIS — M160 Bilateral primary osteoarthritis of hip: Secondary | ICD-10-CM | POA: Insufficient documentation

## 2022-02-14 DIAGNOSIS — G8929 Other chronic pain: Secondary | ICD-10-CM | POA: Insufficient documentation

## 2022-02-14 DIAGNOSIS — M7061 Trochanteric bursitis, right hip: Secondary | ICD-10-CM | POA: Insufficient documentation

## 2022-02-14 DIAGNOSIS — M7062 Trochanteric bursitis, left hip: Secondary | ICD-10-CM | POA: Diagnosis present

## 2022-02-14 DIAGNOSIS — M25551 Pain in right hip: Secondary | ICD-10-CM | POA: Insufficient documentation

## 2022-02-14 DIAGNOSIS — S73191S Other sprain of right hip, sequela: Secondary | ICD-10-CM | POA: Insufficient documentation

## 2022-02-14 DIAGNOSIS — M25552 Pain in left hip: Secondary | ICD-10-CM | POA: Diagnosis present

## 2022-02-14 NOTE — Progress Notes (Signed)
Safety precautions to be maintained throughout the outpatient stay will include: orient to surroundings, keep bed in low position, maintain call bell within reach at all times, provide assistance with transfer out of bed and ambulation.  

## 2022-02-18 ENCOUNTER — Telehealth: Payer: Self-pay | Admitting: Pain Medicine

## 2022-02-18 NOTE — Telephone Encounter (Signed)
Please schedule for an appointment.  Last order says to return if pain is not better.

## 2022-02-18 NOTE — Telephone Encounter (Signed)
Patient states she is still hurting on the back of her head. Says she spoke to Dr. [name] about it last week. Please verify if he wants to eval or procedure for this.

## 2022-02-24 NOTE — Progress Notes (Deleted)
PROVIDER NOTE: Information contained herein reflects review and annotations entered in association with encounter. Interpretation of such information and data should be left to medically-trained personnel. Information provided to patient can be located elsewhere in the medical record under "Patient Instructions". Document created using STT-dictation technology, any transcriptional errors that may result from process are unintentional.    Patient: Danielle [name]  Service Category: E/M  Provider: Gaspar Cola, MD  DOB: 1960/12/20  DOS: 02/27/2022  Referring Provider: Danae Orleans, MD  MRN: [id number]  Specialty: Interventional Pain Management  PCP: Danae Orleans, MD  Type: Established Patient  Setting: Ambulatory outpatient    Location: Office  Delivery: Face-to-face     HPI  [name], a 61 y.o. year old female, is here today because of her No primary diagnosis found.. Danielle [name] primary complain today is No chief complaint on file. Last encounter: My last encounter with her was on 02/18/2022. Pertinent problems: Danielle [name] has Cervical spinal cord compression [facility]) (April, 2017); Cervical spinal stenosis; Chronic shoulder pain (1ry area of Pain) (Bilateral) (R>L); Chronic low back pain (3ry area of Pain) (Bilateral) (R>L) w/ sciatica (Right); Lumbar facet syndrome (Bilateral) (R>L); Lumbar spondylosis; Failed back surgical syndrome; Chronic lower extremity pain (Right); Chronic neck pain (Bilateral) (R>L); Chronic cervical radicular pain (Right); Ulnar neuropathy (Left); Hx of cervical spine surgery; Cervical spondylosis; Fibromyalgia; Chronic sacroiliac pain (Bilateral) (L>R); History of total arthroplasty of shoulder (Left); Chronic pain syndrome; Complaints of weakness of lower extremity; Anterolisthesis of lumbar spine (L4/L5); Cervical facet syndrome ([place]); Chronic musculoskeletal pain; Muscle spasm of back; Myofascial pain; Chronic hip pain (2ry area of Pain) (Bilateral)  (R>L); 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); Trigger point with back pain; Osteoarthritis of shoulder (Bilateral); Spondylosis without myelopathy or radiculopathy, lumbosacral region; Cellulitis of leg, right; Chronic trochanteric bursitis (Bilateral); Osteoarthritis involving multiple joints; Chronic shoulder pain after replacement; History of total shoulder replacement (Right); History of total replacement of shoulder joints (Bilateral); Leg edema, left; Cervical spondylosis with myelopathy and radiculopathy; Chronic hip pain (Right); Osteoarthritis of hip (Right); Chronic sacroiliac joint pain (Left); Other spondylosis, sacral and sacrococcygeal region; Sacroiliac joint dysfunction (Left); Somatic dysfunction of sacroiliac joint (Left); Enthesopathy of sacroiliac joint (Left); DDD (degenerative disc disease), cervical; DDD (degenerative disc disease), lumbosacral; Other intervertebral disc degeneration, lumbar region; Acetabular labrum tear, sequela (Left); Acetabular labrum tear, sequela (Right); Greater trochanteric bursitis of hips (Bilateral); Epidural fibrosis (Left: L5-S1); Chronic groin pain (Bilateral); Abnormal MRI, lumbar spine (09/28/2020); Abnormal MRI, hip (Bilateral) (10/04/2020); Chronic shoulder pain (Left); Cervicalgia; Chronic neck pain with history of cervical spinal surgery; Chronic hip pain (Left); Chronic groin pain (Left); Abnormal drug screen (11/21/2021); Left thigh pain; and Right buttock pain on their pertinent problem list. Pain Assessment: Severity of   is reported as a  /10. Location:    / . Onset:  . Quality:  . Timing:  . Modifying factor(s):  Marland Kitchen Vitals:  vitals were not taken for this visit.   Reason for encounter:  *** . ***  Pharmacotherapy Assessment  Analgesic: NO OPIOIDS. (11/21/2021) UDS. (+) COCAINE. D/C'ed: Oxycodone IR 10 mg, 1 tab PO q 6 hrs (40 mg/day of oxycodone) MME/day: 60 mg/day.   Monitoring: [place] PMP: PDMP reviewed during this encounter.        Pharmacotherapy: No side-effects or adverse reactions reported. Compliance: No problems identified. Effectiveness: Clinically acceptable.  No notes on file  UDS:  Summary  Date Value Ref Range Status  11/21/2021 Note  Final    Comment:    ====================================================================  [employer] Select 13 (MW) ==================================================================== Test                             Result       Flag       Units  Drug Present and Declared for Prescription Verification   Oxycodone                      >2433        EXPECTED   ng/mg creat   Oxymorphone                    164          EXPECTED   ng/mg creat   Noroxycodone                   >2433        EXPECTED   ng/mg creat   Noroxymorphone                 58           EXPECTED   ng/mg creat    Sources of oxycodone are scheduled prescription medications.    Oxymorphone, noroxycodone, and noroxymorphone are expected    metabolites of oxycodone. Oxymorphone is also available as a    scheduled prescription medication.  Drug Present not Declared for Prescription Verification   Methamphetamine                12           UNEXPECTED ng/mg creat    Sources of methamphetamine include illicit sources, as a scheduled    prescription medication, as a metabolite of some prescription drugs,    or use of an l-methamphetamine inhaler.    Oxazepam                       17           UNEXPECTED ng/mg creat    Oxazepam may be administered as a scheduled prescription medication;    it is also an expected metabolite of other benzodiazepine drugs,    including diazepam, chlordiazepoxide, prazepam, clorazepate,    halazepam, and temazepam.    Benzoylecgonine                38           UNEXPECTED ng/mg creat    Benzoylecgonine is a metabolite of cocaine; its presence indicates    use of this drug.  Source is most commonly illicit, but cocaine is    present in some topical anesthetic  solutions.  ==================================================================== Test                      Result    Flag   Units      Ref Range   Creatinine              411              mg/dL      >=20 ==================================================================== Declared Medications:  The flagging and interpretation on this report are based on the  following declared medications.  Unexpected results may arise from  inaccuracies in the declared medications.   **Note: The testing scope of this panel includes these medications:   Oxycodone   **Note: The testing scope of this panel does not include the  following reported medications:  Albuterol  Azelastine  Cetirizine  Citalopram (Celexa)  Diclofenac (Voltaren)  Docusate (Colace)  Fluticasone (Trelegy)  Guaifenesin (Mucinex)  Levothyroxine (Synthroid)  Naloxone  Nystatin  Supplement  Tizanidine (Zanaflex)  Umeclidinium (Trelegy)  Vilanterol (Trelegy)  Vitamin D ==================================================================== For clinical consultation, please call [phone]. ====================================================================      ROS  Constitutional: Denies any fever or chills Gastrointestinal: No reported hemesis, hematochezia, vomiting, or acute GI distress Musculoskeletal: Denies any acute onset joint swelling, redness, loss of ROM, or weakness Neurological: No reported episodes of acute onset apraxia, aphasia, dysarthria, agnosia, amnesia, paralysis, loss of coordination, or loss of consciousness  Medication Review  Azelastine HCl, Fluticasone-Umeclidin-Vilant, albuterol, cetirizine, cholecalciferol, citalopram, diclofenac Sodium, docusate sodium, levothyroxine, magnesium hydroxide, naloxone, nystatin, oxyCODONE, and tiZANidine  History Review  Allergy: Danielle [name] is allergic to aripiprazole, duloxetine, gabapentin, gold, iodinated contrast media, naproxen sodium, nortriptyline  hcl, nsaids, pregabalin, meperidine, ace inhibitors, aspirin, cefpodoxime, fluoxetine, fluticasone-salmeterol, levofloxacin, lithium, paroxetine, tape, telithromycin, theophylline, topiramate, trazodone, triamcinolone, venlafaxine, bupropion, ketorolac tromethamine, morphine, and moxifloxacin. Drug: Danielle [name]  reports no history of drug use. Alcohol:  reports no history of alcohol use. Tobacco:  reports that she has been smoking cigarettes. She has a 80.00 pack-year smoking history. She has never used smokeless tobacco. Social: Danielle [name]  reports that she has been smoking cigarettes. She has a 80.00 pack-year smoking history. She has never used smokeless tobacco. She reports that she does not drink alcohol and does not use drugs. Medical:  has a past medical history of Acute postoperative pain (01/13/2017), Anxiety, Arthritis, Asthma, Bell's palsy, Bursitis, COPD (chronic obstructive pulmonary disease) ([place]), Depression, Fibromyalgia, Heart murmur, Hepatitis C, Hiatal hernia, Hyperlipidemia, Hypertension, IBS (irritable bowel syndrome), Insomnia, and Thyroid disease. Surgical: Danielle [name]  has a past surgical history that includes necksurgery (10/21/2014); Back surgery (2013); Foot surgery (Right); Elbow surgery (Left); Carpal tunnel release (Right); Nose surgery; Partial hysterectomy; Total shoulder replacement (Bilateral); and Colonoscopy with propofol (N/A, 02/05/2017). Family: family history includes Cancer in her mother; Gout in her mother; Heart disease in her father.  Laboratory Chemistry Profile   Renal Lab Results  Component Value Date   BUN 14 04/28/2019   CREATININE 0.98 04/28/2019   BCR 13 02/16/2019   GFRAA >60 04/28/2019   GFRNONAA >60 04/28/2019    Hepatic Lab Results  Component Value Date   AST 18 04/28/2019   ALT 16 04/28/2019   ALBUMIN 3.7 04/28/2019   ALKPHOS 80 04/28/2019    Electrolytes Lab Results  Component Value Date   NA 139 04/28/2019   K 4.1 04/28/2019   CL  105 04/28/2019   CALCIUM 9.3 04/28/2019   MG 2.1 02/16/2019    Bone Lab Results  Component Value Date   25OHVITD1 27 (L) 02/16/2019   25OHVITD2 2.0 02/16/2019   25OHVITD3 25 02/16/2019    Inflammation (CRP: Acute Phase) (ESR: Chronic Phase) Lab Results  Component Value Date   CRP 2 02/16/2019   ESRSEDRATE 23 02/16/2019         Note: Above Lab results reviewed.  Recent Imaging Review  DG PAIN CLINIC C-ARM 1-60 MIN NO REPORT Fluoro was used, but no Radiologist interpretation will be provided.  Please refer to "NOTES" tab for provider progress note. Note: Reviewed        Physical Exam  General appearance: Well nourished, well developed, and well hydrated. In no apparent acute distress Mental status: Alert, oriented x 3 (person, place, & time)       Respiratory: No evidence of  acute respiratory distress Eyes: PERLA Vitals: There were no vitals taken for this visit. BMI: Estimated body mass index is 37.31 kg/m as calculated from the following:   Height as of 02/14/22: _0  (1.651 m).   Weight as of 02/14/22: 224 lb 3.2 oz (101.7 kg). Ideal: Ideal body weight: 57 kg (125 lb 10.6 oz) Adjusted ideal body weight: 74.9 kg (165 lb 1.2 oz)  Assessment   Diagnosis Status  No diagnosis found. Controlled Controlled Controlled   Updated Problems: No problems updated.  Plan of Care  Problem-specific:  No problem-specific Assessment & Plan notes found for this encounter.  Danielle [name] has a current medication list which includes the following long-term medication(s): albuterol, azelastine hcl, citalopram, diclofenac sodium, levothyroxine, oxycodone, oxycodone, [START ON 02/26/2022] oxycodone, [START ON 03/05/2022] oxycodone, [START ON 03/12/2022] oxycodone, [START ON 03/19/2022] oxycodone, and tizanidine.  Pharmacotherapy (Medications Ordered): No orders of the defined types were placed in this encounter.  Orders:  No orders of the defined types were placed in this  encounter.  Follow-up plan:   No follow-ups on file.     Interventional Therapies  Risk  Complexity Considerations:   Estimated body mass index is 38.27 kg/m as calculated from the following:   Height as of this encounter: _1  (1.651 m).   Weight as of this encounter: 230 lb (104.3 kg). 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  Allergic to NSAIDs.  Abnormal UDS (01/02/2016). (+) Unreported tramadol.   Planned  Pending:   Diagnostic/therapeutic right cervical facet MBB #2 (open region)    Under consideration:   Diagnostic caudal ESI + epidurogram  Diagnostic right CESI  Diagnostic/therapeutic right cervical facet MBB #2 Diagnostic left SI Blk    Completed:   Therapeutic bilateral IA hip + peri-articular bursae inj.  #B2 R4 L5 (01/31/2022)  Palliative left trochanteric bursa x2 (09/18/2021) (100/100/90)  Palliative right trochanteric bursa x1 (01/25/2021) (100/100/90)  Palliative right suprascapular NB x3 (08/08/2020) (50/50/100/90-100)   Palliative left suprascapular NB x4 (02/22/2021) (100/100/90/90)  Palliative bilateral suprascapular nerve RFA x1 (05/13/2019) (100/100/90/90-100)  Therapeutic right IA hip joint injection x3 (06/01/2020) (100/100/100/90-100)  Therapeutic left IA hip joint injection x4 (09/18/2021) (100/100/90)  Therapeutic bilateral IA hip joint inj. x1 (12/03/2021) (by Dr. Gillis Santa) (100/100/90)  Diagnostic bilateral lumbar facet MBB x3 (09/30/2017) (100/75/60)  Palliative right lumbar facet RFA x2 (12/16/2017) (100/100/85)  Palliative left lumbar facet RFA x2 (02/12/2018) [facility])  Diagnostic bilateral cervical facet MBB x1 (09/30/2016) (90/100/0)  Diagnostic right SI joint block x1 (06/12/2017) (DNKFU -0/0/80)    Therapeutic  Palliative (PRN) options:   Therapeutic trochanteric bursa   Therapeutic suprascapular NB   Therapeutic suprascapular nerve RFA   Therapeutic IA hip joint injection   Therapeutic lumbar facet MBB   Therapeutic  lumbar facet RFA   Therapeutic SI Blk       Recent Visits Date Type Provider Dept  02/14/22 Office Visit Milinda Pointer, MD [facility]  01/31/22 Procedure visit Milinda Pointer, MD [facility]  01/21/22 Office Visit Milinda Pointer, MD [facility]  12/03/21 Procedure visit Gillis Santa, MD [facility]  Showing recent visits within past 90 days and meeting all other requirements Future Appointments Date Type Provider Dept  02/27/22 Appointment Milinda Pointer, MD [facility]  Showing future appointments within next 90 days and meeting all other requirements  I discussed the assessment and treatment plan with the patient. The patient was provided an opportunity to ask questions and all  were answered. The patient agreed with the plan and demonstrated an understanding of the instructions.  Patient advised to call back or seek an in-person evaluation if the symptoms or condition worsens.  Duration of encounter: *** minutes.  Total time on encounter, as per AMA guidelines included both the face-to-face and non-face-to-face time personally spent by the physician and/or other qualified health care professional(s) on the day of the encounter (includes time in activities that require the physician or other qualified health care professional and does not include time in activities normally performed by clinical staff). Physician's time may include the following activities when performed: preparing to see the patient (eg, review of tests, pre-charting review of records) obtaining and/or reviewing separately obtained history performing a medically appropriate examination and/or evaluation counseling and educating the patient/family/caregiver ordering medications, tests, or procedures referring and communicating with other health care professionals (when not separately reported) documenting clinical information in the electronic or other  health record independently interpreting results (not separately reported) and communicating results to the patient/ family/caregiver care coordination (not separately reported)  Note by: Gaspar Cola, MD Date: 02/27/2022; Time: 6:04 PM

## 2022-02-27 ENCOUNTER — Ambulatory Visit: Payer: Medicaid Other | Admitting: Pain Medicine

## 2022-02-27 ENCOUNTER — Encounter: Payer: Medicaid Other | Admitting: Pain Medicine

## 2022-03-06 ENCOUNTER — Ambulatory Visit: Payer: Medicaid Other | Attending: Pain Medicine | Admitting: Pain Medicine

## 2022-03-06 ENCOUNTER — Telehealth: Payer: Self-pay | Admitting: Pain Medicine

## 2022-03-06 ENCOUNTER — Encounter: Payer: Self-pay | Admitting: Pain Medicine

## 2022-03-06 VITALS — BP 120/78 | HR 103 | Temp 97.4°F | Resp 16 | Ht 65.0 in | Wt 228.0 lb

## 2022-03-06 DIAGNOSIS — M542 Cervicalgia: Secondary | ICD-10-CM | POA: Insufficient documentation

## 2022-03-06 DIAGNOSIS — G8929 Other chronic pain: Secondary | ICD-10-CM | POA: Insufficient documentation

## 2022-03-06 DIAGNOSIS — M25512 Pain in left shoulder: Secondary | ICD-10-CM | POA: Insufficient documentation

## 2022-03-06 DIAGNOSIS — M25511 Pain in right shoulder: Secondary | ICD-10-CM | POA: Insufficient documentation

## 2022-03-06 MED ORDER — KETOROLAC TROMETHAMINE 60 MG/2ML IM SOLN
60.0000 mg | Freq: Once | INTRAMUSCULAR | Status: AC
  Administered 2022-03-06: 60 mg via INTRAMUSCULAR

## 2022-03-06 MED ORDER — KETOROLAC TROMETHAMINE 60 MG/2ML IM SOLN
INTRAMUSCULAR | Status: AC
  Filled 2022-03-06: qty 2

## 2022-03-06 NOTE — Telephone Encounter (Signed)
Patient wants to come in for toradol / norflex. Can she be added to the schedule today?

## 2022-03-06 NOTE — Telephone Encounter (Signed)
Bubble message sent to Dr. [name].

## 2022-03-06 NOTE — Telephone Encounter (Signed)
Ok per Dr. [name]. Patient notified, will come in within the next hour.

## 2022-03-06 NOTE — Progress Notes (Signed)
Patient: Danielle [name]  Service Category: E/M  Provider: Gaspar Cola, MD  DOB: 09-20-1960  DOS: 03/06/2022  Referring Provider: Danae Orleans, MD  MRN: [id number]  Setting: Ambulatory outpatient  PCP: Danae Orleans, MD  Type: Established Patient  Specialty: Interventional Pain Management    Location: Office  Delivery: Face-to-face     Purpose:  The patient comes in today for IM therapy. Case was discussed with attending physician.  Subjective:  Danielle [name] is a 61 y.o. year old, female patient, who comes today for a nurse visit complaining of Neck Pain Her last contact with [place] was on 03/06/2022. Severity of the pain is described as a 5 /10.   No notes on file  Objective:  Danielle [name]  height is '5\' 5"'$  (1.651 m) and weight is 228 lb (103.4 kg). Her temporal temperature is 97.4 F (36.3 C) (abnormal). Her blood pressure is 120/78 and her pulse is 103 (abnormal). Her respiration is 16 and oxygen saturation is 98%.  Body mass index is 37.94 kg/m.  Analgesic:  NO OPIOIDS. (11/21/2021) UDS. (+) COCAINE. D/C'ed: Oxycodone IR 10 mg, 1 tab PO q 6 hrs (40 mg/day of oxycodone) MME/day: 60 mg/day.   Allergies:  Patient is allergic to aripiprazole, duloxetine, gabapentin, gold, iodinated contrast media, naproxen sodium, nortriptyline hcl, nsaids, pregabalin, meperidine, ace inhibitors, aspirin, cefpodoxime, fluoxetine, fluticasone-salmeterol, levofloxacin, lithium, paroxetine, tape, telithromycin, theophylline, topiramate, trazodone, triamcinolone, venlafaxine, bupropion, ketorolac tromethamine, morphine, and moxifloxacin.  Labs:  Lab Results  Component Value Date   BUN 14 04/28/2019   CREATININE 0.98 04/28/2019   GFRAA >60 04/28/2019   GFRNONAA >60 04/28/2019    Assessment:  The primary encounter diagnosis was Cervicalgia. A diagnosis of Chronic shoulder pain (1ry area of Pain) (Bilateral) (R>L) was also pertinent to this visit.  Attestation: Medical screening  examination/treatment/procedure(s) were performed by non-physician practitioner and as supervising physician I was immediately available for consultation/collaboration.  Plan of Care  Orders:  Orders Placed This Encounter  Procedures   Informed Consent Details: Physician/Practitioner Attestation; Transcribe to consent form and obtain patient signature    Do not administer NSAIDs (Toradol, etc.) if patient has an allergy or intolerance to NSAIDs or if patient has CKD (chronic kidney disease or failure). Avoid the use of Muscle Relaxants (orphenedrine/Norflex, etc.) if patient has allergy or intolerance to this medication.    Scheduling Instructions:     Nursing orders: Complete pain questionnaire before administration of therapy. Document location, laterality, onset, level, trigger, and current medications taken for the pain.    Order Specific Question:   Physician/Practitioner attestation of informed consent for procedure/surgical case    Answer:   I, the physician/practitioner, attest that I have discussed with the patient the benefits, risks, side effects, alternatives, likelihood of achieving goals and potential problems during recovery for the procedure that I have provided informed consent.    Order Specific Question:   Procedure    Answer:   IM injection of therapeutic substance    Order Specific Question:   Physician/Practitioner performing the procedure    Answer:   Trentyn Boisclair A. [name], MD    Order Specific Question:   Indication/Reason    Answer:   Acute on chronic pain   Chronic Opioid Analgesic:  NO OPIOIDS. (11/21/2021) UDS. (+) COCAINE. D/C'ed: Oxycodone IR 10 mg, 1 tab PO q 6 hrs (40 mg/day of oxycodone) MME/day: 60 mg/day.   Medications ordered for procedure: Meds ordered this encounter  Medications   ketorolac (TORADOL) injection 60  mg   Medications administered: We administered ketorolac.  See the medical record for exact dosing, route, and time of  administration.  Follow-up plan:   Return if symptoms worsen or fail to improve.       Interventional Therapies  Risk  Complexity Considerations:   Estimated body mass index is 38.27 kg/m as calculated from the following:   Height as of this encounter: '5\' 5"'$  (1.651 m).   Weight as of this encounter: 230 lb (104.3 kg). 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  Allergic to NSAIDs.  Abnormal UDS (01/02/2016). (+) Unreported tramadol.   Planned  Pending:   Diagnostic/therapeutic right cervical facet MBB #2 (open region)    Under consideration:   Diagnostic caudal ESI + epidurogram  Diagnostic right CESI  Diagnostic/therapeutic right cervical facet MBB #2 Diagnostic left SI Blk    Completed:   Therapeutic bilateral IA hip + peri-articular bursae inj.  #B2 R4 L5 (01/31/2022)  Palliative left trochanteric bursa x2 (09/18/2021) (100/100/90)  Palliative right trochanteric bursa x1 (01/25/2021) (100/100/90)  Palliative right suprascapular NB x3 (08/08/2020) (50/50/100/90-100)   Palliative left suprascapular NB x4 (02/22/2021) (100/100/90/90)  Palliative bilateral suprascapular nerve RFA x1 (05/13/2019) (100/100/90/90-100)  Therapeutic right IA hip joint injection x3 (06/01/2020) (100/100/100/90-100)  Therapeutic left IA hip joint injection x4 (09/18/2021) (100/100/90)  Therapeutic bilateral IA hip joint inj. x1 (12/03/2021) (by Dr. Gillis Santa) (100/100/90)  Diagnostic bilateral lumbar facet MBB x3 (09/30/2017) (100/75/60)  Palliative right lumbar facet RFA x2 (12/16/2017) (100/100/85)  Palliative left lumbar facet RFA x2 (02/12/2018) [facility])  Diagnostic bilateral cervical facet MBB x1 (09/30/2016) (90/100/0)  Diagnostic right SI joint block x1 (06/12/2017) (DNKFU -0/0/80)    Therapeutic  Palliative (PRN) options:   Therapeutic trochanteric bursa   Therapeutic suprascapular NB   Therapeutic suprascapular nerve RFA   Therapeutic IA hip joint injection   Therapeutic  lumbar facet MBB   Therapeutic lumbar facet RFA   Therapeutic SI Blk      Recent Visits Date Type Provider Dept  02/14/22 Office Visit Milinda Pointer, MD [facility]  01/31/22 Procedure visit Milinda Pointer, MD [facility]  01/21/22 Office Visit Milinda Pointer, MD [facility]  Showing recent visits within past 90 days and meeting all other requirements Today's Visits Date Type Provider Dept  03/06/22 Office Visit Milinda Pointer, MD [facility]  Showing today's visits and meeting all other requirements Future Appointments Date Type Provider Dept  03/27/22 Appointment Milinda Pointer, MD [facility]  Showing future appointments within next 90 days and meeting all other requirements  Disposition: Discharge home  Discharge (Date  Time): 03/06/2022; 1430 hrs.   Primary Care Physician: Danae Orleans, MD Location: [facility] Note by: Gaspar Cola, MD Date: 03/06/2022; Time: 2:56 PM  Disclaimer:  Medicine is not an Chief Strategy Officer. The only guarantee in medicine is that nothing is guaranteed. It is important to note that the decision to proceed with this intervention was based on the information collected from the patient. The Data and conclusions were drawn from the patient's questionnaire, the interview, and the physical examination. Because the information was provided in large part by the patient, it cannot be guaranteed that it has not been purposely or unconsciously manipulated. Every effort has been made to obtain as much relevant data as possible for this evaluation. It is important to note that the conclusions that lead to this procedure are derived in large part from the available data. Always take into  account that the treatment will also be dependent on availability of resources and existing treatment guidelines, considered by other Pain Management Practitioners as being common  knowledge and practice, at the time of the intervention. For Medico-Legal purposes, it is also important to point out that variation in procedural techniques and pharmacological choices are the acceptable norm. The indications, contraindications, technique, and results of the above procedure should only be interpreted and judged by a Board-Certified Interventional Pain Specialist with extensive familiarity and expertise in the same exact procedure and technique.

## 2022-03-26 NOTE — Progress Notes (Unsigned)
PROVIDER NOTE: Information contained herein reflects review and annotations entered in association with encounter. Interpretation of such information and data should be left to medically-trained personnel. Information provided to patient can be located elsewhere in the medical record under "Patient Instructions". Document created using STT-dictation technology, any transcriptional errors that may result from process are unintentional.    Patient: Danielle [name]  Service Category: E/M  Provider: Gaspar Cola, MD  DOB: February 24, 1961  DOS: 03/27/2022  Referring Provider: Danae Orleans, MD  MRN: [id number]  Specialty: Interventional Pain Management  PCP: Danae Orleans, MD  Type: Established Patient  Setting: Ambulatory outpatient    Location: Office  Delivery: Face-to-face     HPI  [name], a 61 y.o. year old female, is here today because of her No primary diagnosis found.. Ms. Chea primary complain today is No chief complaint on file. Last encounter: My last encounter with her was on 03/06/2022. Pertinent problems: Ms. Koroma has Cervical spinal cord compression [facility]) (April, 2017); Cervical spinal stenosis; Chronic shoulder pain (1ry area of Pain) (Bilateral) (R>L); Chronic low back pain (3ry area of Pain) (Bilateral) (R>L) w/ sciatica (Right); Lumbar facet syndrome (Bilateral) (R>L); Lumbar spondylosis; Failed back surgical syndrome; Chronic lower extremity pain (Right); Chronic neck pain (Bilateral) (R>L); Chronic cervical radicular pain (Right); Ulnar neuropathy (Left); Hx of cervical spine surgery; Cervical spondylosis; Fibromyalgia; Chronic sacroiliac pain (Bilateral) (L>R); History of total arthroplasty of shoulder (Left); Chronic pain syndrome; Complaints of weakness of lower extremity; Anterolisthesis of lumbar spine (L4/L5); Cervical facet syndrome ([place]); Chronic musculoskeletal pain; Muscle spasm of back; Myofascial pain; Chronic hip pain (2ry area of Pain) (Bilateral)  (R>L); 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); Trigger point with back pain; Osteoarthritis of shoulder (Bilateral); Spondylosis without myelopathy or radiculopathy, lumbosacral region; Cellulitis of leg, right; Chronic trochanteric bursitis (Bilateral); Osteoarthritis involving multiple joints; Chronic shoulder pain after replacement; History of total shoulder replacement (Right); History of total replacement of shoulder joints (Bilateral); Leg edema, left; Cervical spondylosis with myelopathy and radiculopathy; Chronic hip pain (Right); Osteoarthritis of hip (Right); Chronic sacroiliac joint pain (Left); Other spondylosis, sacral and sacrococcygeal region; Sacroiliac joint dysfunction (Left); Somatic dysfunction of sacroiliac joint (Left); Enthesopathy of sacroiliac joint (Left); DDD (degenerative disc disease), cervical; DDD (degenerative disc disease), lumbosacral; Other intervertebral disc degeneration, lumbar region; Acetabular labrum tear, sequela (Left); Acetabular labrum tear, sequela (Right); Greater trochanteric bursitis of hips (Bilateral); Epidural fibrosis (Left: L5-S1); Chronic groin pain (Bilateral); Abnormal MRI, lumbar spine (09/28/2020); Abnormal MRI, hip (Bilateral) (10/04/2020); Chronic shoulder pain (Left); Cervicalgia; Chronic neck pain with history of cervical spinal surgery; Chronic hip pain (Left); Chronic groin pain (Left); Abnormal drug screen (11/21/2021); Left thigh pain; and Right buttock pain on their pertinent problem list. Pain Assessment: Severity of   is reported as a  /10. Location:    / . Onset:  . Quality:  . Timing:  . Modifying factor(s):  Marland Kitchen Vitals:  vitals were not taken for this visit.   Reason for encounter:  *** . ***  Pharmacotherapy Assessment  Analgesic: NO OPIOIDS. (11/21/2021) UDS. (+) COCAINE. D/C'ed: Oxycodone IR 10 mg, 1 tab PO q 6 hrs (40 mg/day of oxycodone) MME/day: 60 mg/day.   Monitoring: [place] PMP: PDMP reviewed during this encounter.        Pharmacotherapy: No side-effects or adverse reactions reported. Compliance: No problems identified. Effectiveness: Clinically acceptable.  No notes on file  No results found for: "CBDTHCR" No results found for: "D8THCCBX" No results found for: "D9THCCBX"  UDS:  Summary  Date Value Ref Range Status  11/21/2021 Note  Final    Comment:    ==================================================================== [employer] (MW) ==================================================================== Test                             Result       Flag       Units  Drug Present and Declared for Prescription Verification   Oxycodone                      >2433        EXPECTED   ng/mg creat   Oxymorphone                    164          EXPECTED   ng/mg creat   Noroxycodone                   >2433        EXPECTED   ng/mg creat   Noroxymorphone                 58           EXPECTED   ng/mg creat    Sources of oxycodone are scheduled prescription medications.    Oxymorphone, noroxycodone, and noroxymorphone are expected    metabolites of oxycodone. Oxymorphone is also available as a    scheduled prescription medication.  Drug Present not Declared for Prescription Verification   Methamphetamine                12           UNEXPECTED ng/mg creat    Sources of methamphetamine include illicit sources, as a scheduled    prescription medication, as a metabolite of some prescription drugs,    or use of an l-methamphetamine inhaler.    Oxazepam                       17           UNEXPECTED ng/mg creat    Oxazepam may be administered as a scheduled prescription medication;    it is also an expected metabolite of other benzodiazepine drugs,    including diazepam, chlordiazepoxide, prazepam, clorazepate,    halazepam, and temazepam.    Benzoylecgonine                38           UNEXPECTED ng/mg creat    Benzoylecgonine is a metabolite of cocaine; its presence indicates    use of this drug.   Source is most commonly illicit, but cocaine is    present in some topical anesthetic solutions.  ==================================================================== Test                      Result    Flag   Units      Ref Range   Creatinine              411              mg/dL      >=20 ==================================================================== Declared Medications:  The flagging and interpretation on this report are based on the  following declared medications.  Unexpected results may arise from  inaccuracies in the declared medications.   **Note: The testing scope of this panel includes these medications:  Oxycodone   **Note: The testing scope of this panel does not include the  following reported medications:   Albuterol  Azelastine  Cetirizine  Citalopram (Celexa)  Diclofenac (Voltaren)  Docusate (Colace)  Fluticasone (Trelegy)  Guaifenesin (Mucinex)  Levothyroxine (Synthroid)  Naloxone  Nystatin  Supplement  Tizanidine (Zanaflex)  Umeclidinium (Trelegy)  Vilanterol (Trelegy)  Vitamin D ==================================================================== For clinical consultation, please call [phone]. ====================================================================       ROS  Constitutional: Denies any fever or chills Gastrointestinal: No reported hemesis, hematochezia, vomiting, or acute GI distress Musculoskeletal: Denies any acute onset joint swelling, redness, loss of ROM, or weakness Neurological: No reported episodes of acute onset apraxia, aphasia, dysarthria, agnosia, amnesia, paralysis, loss of coordination, or loss of consciousness  Medication Review  Azelastine HCl, Fluticasone-Umeclidin-Vilant, albuterol, cetirizine, cholecalciferol, citalopram, diclofenac Sodium, docusate sodium, levothyroxine, magnesium hydroxide, naloxone, nystatin, oxyCODONE, and tiZANidine  History Review  Allergy: Ms. Mosher is allergic to  aripiprazole, duloxetine, gabapentin, gold, iodinated contrast media, naproxen sodium, nortriptyline hcl, nsaids, pregabalin, meperidine, ace inhibitors, aspirin, cefpodoxime, fluoxetine, fluticasone-salmeterol, levofloxacin, lithium, paroxetine, tape, telithromycin, theophylline, topiramate, trazodone, triamcinolone, venlafaxine, bupropion, ketorolac tromethamine, morphine, and moxifloxacin. Drug: Ms. Hinks  reports no history of drug use. Alcohol:  reports no history of alcohol use. Tobacco:  reports that she has been smoking cigarettes. She has a 80.00 pack-year smoking history. She has never used smokeless tobacco. Social: Ms. Faith  reports that she has been smoking cigarettes. She has a 80.00 pack-year smoking history. She has never used smokeless tobacco. She reports that she does not drink alcohol and does not use drugs. Medical:  has a past medical history of Acute postoperative pain (01/13/2017), Anxiety, Arthritis, Asthma, Bell's palsy, Bursitis, COPD (chronic obstructive pulmonary disease) ([place]), Depression, Fibromyalgia, Heart murmur, Hepatitis C, Hiatal hernia, Hyperlipidemia, Hypertension, IBS (irritable bowel syndrome), Insomnia, and Thyroid disease. Surgical: Ms. Ast  has a past surgical history that includes necksurgery (10/21/2014); Back surgery (2013); Foot surgery (Right); Elbow surgery (Left); Carpal tunnel release (Right); Nose surgery; Partial hysterectomy; Total shoulder replacement (Bilateral); and Colonoscopy with propofol (N/A, 02/05/2017). Family: family history includes Cancer in her mother; Gout in her mother; Heart disease in her father.  Laboratory Chemistry Profile   Renal Lab Results  Component Value Date   BUN 14 04/28/2019   CREATININE 0.98 04/28/2019   BCR 13 02/16/2019   GFRAA >60 04/28/2019   GFRNONAA >60 04/28/2019    Hepatic Lab Results  Component Value Date   AST 18 04/28/2019   ALT 16 04/28/2019   ALBUMIN 3.7 04/28/2019   ALKPHOS 80 04/28/2019     Electrolytes Lab Results  Component Value Date   NA 139 04/28/2019   K 4.1 04/28/2019   CL 105 04/28/2019   CALCIUM 9.3 04/28/2019   MG 2.1 02/16/2019    Bone Lab Results  Component Value Date   25OHVITD1 27 (L) 02/16/2019   25OHVITD2 2.0 02/16/2019   25OHVITD3 25 02/16/2019    Inflammation (CRP: Acute Phase) (ESR: Chronic Phase) Lab Results  Component Value Date   CRP 2 02/16/2019   ESRSEDRATE 23 02/16/2019         Note: Above Lab results reviewed.  Recent Imaging Review  DG PAIN CLINIC C-ARM 1-60 MIN NO REPORT Fluoro was used, but no Radiologist interpretation will be provided.  Please refer to "NOTES" tab for provider progress note. Note: Reviewed        Physical Exam  General appearance: Well nourished, well developed, and well hydrated. In no apparent acute  distress Mental status: Alert, oriented x 3 (person, place, & time)       Respiratory: No evidence of acute respiratory distress Eyes: PERLA Vitals: There were no vitals taken for this visit. BMI: Estimated body mass index is 37.94 kg/m as calculated from the following:   Height as of 03/06/22: 5' 5" (1.651 m).   Weight as of 03/06/22: 228 lb (103.4 kg). Ideal: Patient weight not recorded  Assessment   Diagnosis Status  No diagnosis found. Controlled Controlled Controlled   Updated Problems: No problems updated.  Plan of Care  Problem-specific:  No problem-specific Assessment & Plan notes found for this encounter.  Ms. EMELYN ROEN has a current medication list which includes the following long-term medication(s): albuterol, azelastine hcl, citalopram, diclofenac sodium, levothyroxine, oxycodone, oxycodone, oxycodone, and tizanidine.  Pharmacotherapy (Medications Ordered): No orders of the defined types were placed in this encounter.  Orders:  No orders of the defined types were placed in this encounter.  Follow-up plan:   No follow-ups on file.     Interventional Therapies  Risk   Complexity Considerations:   Estimated body mass index is 38.27 kg/m as calculated from the following:   Height as of this encounter: 5' 5" (1.651 m).   Weight as of this encounter: 230 lb (104.3 kg). 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  Allergic to NSAIDs.  Abnormal UDS (01/02/2016). (+) Unreported tramadol.   Planned  Pending:   Diagnostic/therapeutic right cervical facet MBB #2 (open region)    Under consideration:   Diagnostic caudal ESI + epidurogram  Diagnostic right CESI  Diagnostic/therapeutic right cervical facet MBB #2 Diagnostic left SI Blk    Completed:   Therapeutic bilateral IA hip + peri-articular bursae inj.  #B2 R4 L5 (01/31/2022)  Palliative left trochanteric bursa x2 (09/18/2021) (100/100/90)  Palliative right trochanteric bursa x1 (01/25/2021) (100/100/90)  Palliative right suprascapular NB x3 (08/08/2020) (50/50/100/90-100)   Palliative left suprascapular NB x4 (02/22/2021) (100/100/90/90)  Palliative bilateral suprascapular nerve RFA x1 (05/13/2019) (100/100/90/90-100)  Therapeutic right IA hip joint injection x3 (06/01/2020) (100/100/100/90-100)  Therapeutic left IA hip joint injection x4 (09/18/2021) (100/100/90)  Therapeutic bilateral IA hip joint inj. x1 (12/03/2021) (by Dr. Gillis Santa) (100/100/90)  Diagnostic bilateral lumbar facet MBB x3 (09/30/2017) (100/75/60)  Palliative right lumbar facet RFA x2 (12/16/2017) (100/100/85)  Palliative left lumbar facet RFA x2 (02/12/2018) [facility])  Diagnostic bilateral cervical facet MBB x1 (09/30/2016) (90/100/0)  Diagnostic right SI joint block x1 (06/12/2017) (DNKFU -0/0/80)    Therapeutic  Palliative (PRN) options:   Therapeutic trochanteric bursa   Therapeutic suprascapular NB   Therapeutic suprascapular nerve RFA   Therapeutic IA hip joint injection   Therapeutic lumbar facet MBB   Therapeutic lumbar facet RFA   Therapeutic SI Blk       Recent Visits Date Type Provider Dept  03/06/22  Office Visit Milinda Pointer, MD [facility]  02/14/22 Office Visit Milinda Pointer, MD [facility]  01/31/22 Procedure visit Milinda Pointer, MD [facility]  01/21/22 Office Visit Milinda Pointer, MD [facility]  Showing recent visits within past 90 days and meeting all other requirements Future Appointments Date Type Provider Dept  03/27/22 Appointment Milinda Pointer, MD [facility]  Showing future appointments within next 90 days and meeting all other requirements  I discussed the assessment and treatment plan with the patient. The patient was provided an opportunity to ask questions and all were answered. The patient agreed with the plan and demonstrated an  understanding of the instructions.  Patient advised to call back or seek an in-person evaluation if the symptoms or condition worsens.  Duration of encounter: *** minutes.  Total time on encounter, as per AMA guidelines included both the face-to-face and non-face-to-face time personally spent by the physician and/or other qualified health care professional(s) on the day of the encounter (includes time in activities that require the physician or other qualified health care professional and does not include time in activities normally performed by clinical staff). Physician's time may include the following activities when performed: preparing to see the patient (eg, review of tests, pre-charting review of records) obtaining and/or reviewing separately obtained history performing a medically appropriate examination and/or evaluation counseling and educating the patient/family/caregiver ordering medications, tests, or procedures referring and communicating with other health care professionals (when not separately reported) documenting clinical information in the electronic or other health record independently interpreting results (not separately reported) and communicating  results to the patient/ family/caregiver care coordination (not separately reported)  Note by: Gaspar Cola, MD Date: 03/27/2022; Time: 4:36 PM

## 2022-03-27 ENCOUNTER — Encounter: Payer: Self-pay | Admitting: Pain Medicine

## 2022-03-27 ENCOUNTER — Ambulatory Visit: Payer: Medicaid Other | Attending: Pain Medicine | Admitting: Pain Medicine

## 2022-03-27 ENCOUNTER — Other Ambulatory Visit: Payer: Self-pay

## 2022-03-27 VITALS — BP 137/87 | HR 89 | Temp 97.5°F | Resp 16 | Ht 65.0 in | Wt 228.0 lb

## 2022-03-27 DIAGNOSIS — G8929 Other chronic pain: Secondary | ICD-10-CM | POA: Insufficient documentation

## 2022-03-27 DIAGNOSIS — M47812 Spondylosis without myelopathy or radiculopathy, cervical region: Secondary | ICD-10-CM | POA: Insufficient documentation

## 2022-03-27 DIAGNOSIS — M542 Cervicalgia: Secondary | ICD-10-CM | POA: Insufficient documentation

## 2022-03-27 NOTE — Progress Notes (Signed)
Safety precautions to be maintained throughout the outpatient stay will include: orient to surroundings, keep bed in low position, maintain call bell within reach at all times, provide assistance with transfer out of bed and ambulation.  

## 2022-03-27 NOTE — Patient Instructions (Addendum)
______________________________________________________________________  Preparing for Procedure with Sedation  NOTICE: Due to recent regulatory changes, starting on February 12, 2021, procedures requiring intravenous (IV) sedation will no longer be performed at the [facility].  These types of procedures are required to be performed at [facility] ambulatory surgery facility.  We are very sorry for the inconvenience.  Procedure appointments are limited to planned procedures: No Prescription Refills. No disability issues will be discussed. No medication changes will be discussed.  Instructions: Oral Intake: Do not eat or drink anything for at least 8 hours prior to your procedure. (Exception: Blood Pressure Medication. See below.) Transportation: A driver is required. You may not drive yourself after the procedure. Blood Pressure Medicine: Do not forget to take your blood pressure medicine with a sip of water the morning of the procedure. If your Diastolic (lower reading) is above 100 mmHg, elective cases will be cancelled/rescheduled. Blood thinners: These will need to be stopped for procedures. Notify our staff if you are taking any blood thinners. Depending on which one you take, there will be specific instructions on how and when to stop it. Diabetics on insulin: Notify the staff so that you can be scheduled 1st case in the morning. If your diabetes requires high dose insulin, take only  of your normal insulin dose the morning of the procedure and notify the staff that you have done so. Preventing infections: Shower with an antibacterial soap the morning of your procedure. Build-up your immune system: Take 1000 mg of Vitamin C with every meal (3 times a day) the day prior to your procedure. Antibiotics: Inform the staff if you have a condition or reason that requires you to take antibiotics before dental procedures. Pregnancy: If you are pregnant, call and cancel the procedure. Sickness: If  you have a cold, fever, or any active infections, call and cancel the procedure. Arrival: You must be in the facility at least 30 minutes prior to your scheduled procedure. Children: Do not bring children with you. Dress appropriately: There is always the possibility that your clothing may get soiled. Valuables: Do not bring any jewelry or valuables.  Reasons to call and reschedule or cancel your procedure: (Following these recommendations will minimize the risk of a serious complication.) Surgeries: Avoid having procedures within 2 weeks of any surgery. (Avoid for 2 weeks before or after any surgery). Flu Shots: Avoid having procedures within 2 weeks of a flu shots. (Avoid for 2 weeks before or after immunizations). Barium: Avoid having a procedure within 7-10 days after having had a radiological study involving the use of radiological contrast. (Myelograms, Barium swallow or enema study). Heart attacks: Avoid any elective procedures or surgeries for the initial 6 months after a "Myocardial Infarction" (Heart Attack). Blood thinners: It is imperative that you stop these medications before procedures. Let us know if you if you take any blood thinner.  Infection: Avoid procedures during or within two weeks of an infection (including chest colds or gastrointestinal problems). Symptoms associated with infections include: Localized redness, fever, chills, night sweats or profuse sweating, burning sensation when voiding, cough, congestion, stuffiness, runny nose, sore throat, diarrhea, nausea, vomiting, cold or Flu symptoms, recent or current infections. It is specially important if the infection is over the area that we intend to treat. Heart and lung problems: Symptoms that may suggest an active cardiopulmonary problem include: cough, chest pain, breathing difficulties or shortness of breath, dizziness, ankle swelling, uncontrolled high or unusually low blood pressure, and/or palpitations. If you are    experiencing any of these symptoms, cancel your procedure and contact your primary care physician for an evaluation.  Remember:  Regular Business hours are:  Monday to Thursday 8:00 AM to 4:00 PM  Provider's Schedule: Francisco Naveira, MD:  Procedure days: Tuesday and Thursday 7:30 AM to 4:00 PM  Bilal Lateef, MD:  Procedure days: Monday and Wednesday 7:30 AM to 4:00 PM ______________________________________________________________________  ____________________________________________________________________________________________  General Risks and Possible Complications  Patient Responsibilities: It is important that you read this as it is part of your informed consent. It is our duty to inform you of the risks and possible complications associated with treatments offered to you. It is your responsibility as a patient to read this and to ask questions about anything that is not clear or that you believe was not covered in this document.  Patient's Rights: You have the right to refuse treatment. You also have the right to change your mind, even after initially having agreed to have the treatment done. However, under this last option, if you wait until the last second to change your mind, you may be charged for the materials used up to that point.  Introduction: Medicine is not an exact science. Everything in Medicine, including the lack of treatment(s), carries the potential for danger, harm, or loss (which is by definition: Risk). In Medicine, a complication is a secondary problem, condition, or disease that can aggravate an already existing one. All treatments carry the risk of possible complications. The fact that a side effects or complications occurs, does not imply that the treatment was conducted incorrectly. It must be clearly understood that these can happen even when everything is done following the highest safety standards.  No treatment: You can choose not to proceed with the  proposed treatment alternative. The "PRO(s)" would include: avoiding the risk of complications associated with the therapy. The "CON(s)" would include: not getting any of the treatment benefits. These benefits fall under one of three categories: diagnostic; therapeutic; and/or palliative. Diagnostic benefits include: getting information which can ultimately lead to improvement of the disease or symptom(s). Therapeutic benefits are those associated with the successful treatment of the disease. Finally, palliative benefits are those related to the decrease of the primary symptoms, without necessarily curing the condition (example: decreasing the pain from a flare-up of a chronic condition, such as incurable terminal cancer).  General Risks and Complications: These are associated to most interventional treatments. They can occur alone, or in combination. They fall under one of the following six (6) categories: no benefit or worsening of symptoms; bleeding; infection; nerve damage; allergic reactions; and/or death. No benefits or worsening of symptoms: In Medicine there are no guarantees, only probabilities. No healthcare provider can ever guarantee that a medical treatment will work, they can only state the probability that it may. Furthermore, there is always the possibility that the condition may worsen, either directly, or indirectly, as a consequence of the treatment. Bleeding: This is more common if the patient is taking a blood thinner, either prescription or over the counter (example: Goody Powders, Fish oil, Aspirin, Garlic, etc.), or if suffering a condition associated with impaired coagulation (example: Hemophilia, cirrhosis of the liver, low platelet counts, etc.). However, even if you do not have one on these, it can still happen. If you have any of these conditions, or take one of these drugs, make sure to notify your treating physician. Infection: This is more common in patients with a compromised  immune system, either due to disease (example:   diabetes, cancer, human immunodeficiency virus [HIV], etc.), or due to medications or treatments (example: therapies used to treat cancer and rheumatological diseases). However, even if you do not have one on these, it can still happen. If you have any of these conditions, or take one of these drugs, make sure to notify your treating physician. Nerve Damage: This is more common when the treatment is an invasive one, but it can also happen with the use of medications, such as those used in the treatment of cancer. The damage can occur to small secondary nerves, or to large primary ones, such as those in the spinal cord and brain. This damage may be temporary or permanent and it may lead to impairments that can range from temporary numbness to permanent paralysis and/or brain death. Allergic Reactions: Any time a substance or material comes in contact with our body, there is the possibility of an allergic reaction. These can range from a mild skin rash (contact dermatitis) to a severe systemic reaction (anaphylactic reaction), which can result in death. Death: In general, any medical intervention can result in death, most of the time due to an unforeseen complication. ____________________________________________________________________________________________ Preparing for Procedure with Sedation Instructions: Oral Intake: Do not eat or drink anything for at least 8 hours prior to your procedure. Transportation: Public transportation is not allowed. Bring an adult driver. The driver must be physically present in our waiting room before any procedure can be started. Physical Assistance: Bring an adult capable of physically assisting you, in the event you need help. Blood Pressure Medicine: Take your blood pressure medicine with a sip of water the morning of the procedure. Insulin: Take only  of your normal insulin dose. Preventing infections: Shower with an  antibacterial soap the morning of your procedure. Build-up your immune system: Take 1000 mg of Vitamin C with every meal (3 times a day) the day prior to your procedure. Pregnancy: If you are pregnant, call and cancel the procedure. Sickness: If you have a cold, fever, or any active infections, call and cancel the procedure. Arrival: You must be in the facility at least 30 minutes prior to your scheduled procedure. Children: Do not bring children with you. Dress appropriately: Bring dark clothing that you would not mind if they get stained. Valuables: Do not bring any jewelry or valuables. Procedure appointments are reserved for interventional treatments only. No Prescription Refills. No medication changes will be discussed during procedure appointments. No disability issues will be discussed.

## 2022-04-11 ENCOUNTER — Ambulatory Visit: Payer: Medicaid Other | Attending: Pain Medicine | Admitting: Pain Medicine

## 2022-04-11 ENCOUNTER — Encounter: Payer: Self-pay | Admitting: Pain Medicine

## 2022-04-11 ENCOUNTER — Ambulatory Visit
Admission: RE | Admit: 2022-04-11 | Discharge: 2022-04-11 | Disposition: A | Discharge status: Home / Self Care | Payer: Medicaid Other | Source: Ambulatory Visit | Attending: Pain Medicine | Admitting: Pain Medicine

## 2022-04-11 VITALS — BP 118/64 | HR 88 | Temp 97.2°F | Resp 16 | Ht 65.0 in | Wt 228.0 lb

## 2022-04-11 DIAGNOSIS — M47812 Spondylosis without myelopathy or radiculopathy, cervical region: Secondary | ICD-10-CM | POA: Diagnosis present

## 2022-04-11 DIAGNOSIS — Z6841 Body Mass Index (BMI) 40.0 and over, adult: Secondary | ICD-10-CM | POA: Insufficient documentation

## 2022-04-11 DIAGNOSIS — M542 Cervicalgia: Secondary | ICD-10-CM | POA: Diagnosis present

## 2022-04-11 DIAGNOSIS — G8929 Other chronic pain: Secondary | ICD-10-CM | POA: Insufficient documentation

## 2022-04-11 DIAGNOSIS — M5030 Other cervical disc degeneration, unspecified cervical region: Secondary | ICD-10-CM | POA: Diagnosis present

## 2022-04-11 MED ORDER — DEXAMETHASONE SODIUM PHOSPHATE 10 MG/ML IJ SOLN
INTRAMUSCULAR | Status: AC
  Filled 2022-04-11: qty 1

## 2022-04-11 MED ORDER — MIDAZOLAM HCL 5 MG/5ML IJ SOLN
INTRAMUSCULAR | Status: AC
  Filled 2022-04-11: qty 5

## 2022-04-11 MED ORDER — LIDOCAINE HCL 2 % IJ SOLN
20.0000 mL | Freq: Once | INTRAMUSCULAR | Status: AC
  Administered 2022-04-11: 400 mg

## 2022-04-11 MED ORDER — PENTAFLUOROPROP-TETRAFLUOROETH EX AERO
INHALATION_SPRAY | Freq: Once | CUTANEOUS | Status: DC
  Filled 2022-04-11: qty 116

## 2022-04-11 MED ORDER — LACTATED RINGERS IV SOLN: Freq: Once | INTRAVENOUS | Status: AC

## 2022-04-11 MED ORDER — FENTANYL CITRATE (PF) 100 MCG/2ML IJ SOLN
25.0000 ug | INTRAMUSCULAR | Status: DC | PRN
  Administered 2022-04-11: 100 ug via INTRAVENOUS

## 2022-04-11 MED ORDER — MIDAZOLAM HCL 5 MG/5ML IJ SOLN
0.5000 mg | Freq: Once | INTRAMUSCULAR | Status: AC
  Administered 2022-04-11: 2 mg via INTRAVENOUS

## 2022-04-11 MED ORDER — DEXAMETHASONE SODIUM PHOSPHATE 10 MG/ML IJ SOLN
10.0000 mg | Freq: Once | INTRAMUSCULAR | Status: AC
  Administered 2022-04-11: 10 mg

## 2022-04-11 MED ORDER — LIDOCAINE HCL 2 % IJ SOLN
INTRAMUSCULAR | Status: AC
  Filled 2022-04-11: qty 20

## 2022-04-11 MED ORDER — ROPIVACAINE HCL 2 MG/ML IJ SOLN
9.0000 mL | Freq: Once | INTRAMUSCULAR | Status: AC
  Administered 2022-04-11: 9 mL via PERINEURAL

## 2022-04-11 MED ORDER — ROPIVACAINE HCL 2 MG/ML IJ SOLN
INTRAMUSCULAR | Status: AC
  Filled 2022-04-11: qty 20

## 2022-04-11 MED ORDER — FENTANYL CITRATE (PF) 100 MCG/2ML IJ SOLN
INTRAMUSCULAR | Status: AC
  Filled 2022-04-11: qty 2

## 2022-04-11 NOTE — Progress Notes (Signed)
Safety precautions to be maintained throughout the outpatient stay will include: orient to surroundings, keep bed in low position, maintain call bell within reach at all times, provide assistance with transfer out of bed and ambulation.  

## 2022-04-11 NOTE — Patient Instructions (Signed)

## 2022-04-11 NOTE — Progress Notes (Signed)
PROVIDER NOTE: Interpretation of information contained herein should be left to medically-trained personnel. Specific patient instructions are provided elsewhere under "Patient Instructions" section of medical record. This document was created in part using STT-dictation technology, any transcriptional errors that may result from this process are unintentional.  Patient: Danielle [name] Type: Established DOB: Aug 26, 1960 MRN: [id number] PCP: Danae Orleans, MD  Service: Procedure DOS: 04/11/2022 Setting: Ambulatory Location: Ambulatory outpatient facility Delivery: Face-to-face Provider: Gaspar Cola, MD Specialty: Interventional Pain Management Specialty designation: 09 Location: Outpatient facility Ref. Prov.: Milinda Pointer, MD     Procedure:           Type: Cervical Facet Medial Branch Block(s) #2  Laterality: Right  Level: C3, C4, C5, C6, & C7 Medial Branch Level(s). Injecting these levels blocks the C3-4, C4-5, C5-6, and C6-7 cervical facet joints.  Imaging: Fluoroscopic guidance Anesthesia: Local anesthesia (1-2% Lidocaine) Anxiolysis: IV Versed 3.0 mg Sedation: Moderate Sedation Fentanyl 2.0 mL (100 mcg) DOS: 04/11/2022  Performed by: Gaspar Cola, MD  Purpose: Diagnostic/Therapeutic Indications: Cervicalgia (cervical spine axial pain) severe enough to impact quality of life or function. 1. Cervical facet syndrome (HCC)   2. Spondylosis without myelopathy or radiculopathy, cervical region   3. DDD (degenerative disc disease), cervical   4. Cervicalgia   5. Morbid obesity with BMI of 40.0-44.9, adult ([place])    NAS-11 Pain score:   Pre-procedure: 1 /10   Post-procedure: 0-No pain/10     Position / Prep / Materials:  Position: Prone. Head in cradle. C-spine slightly flexed. Prep solution: DuraPrep (Iodine Povacrylex [0.7% available iodine] and Isopropyl Alcohol, 74% w/w) Prep Area: Posterior Cervico-thoracic Region. From occipital ridge to tip of  scapula, and from shoulder to shoulder. Entire posterior and lateral neck surface. Materials:  Tray: Block Needle(s):  Type: Spinal  Gauge (G): 22"  Length: 3.5-in  Qty: 5  Pre-op H&P Assessment:  Danielle [name] is a 61 y.o. (year old), female patient, seen today for interventional treatment. She  has a past surgical history that includes necksurgery (10/21/2014); Back surgery (2013); Foot surgery (Right); Elbow surgery (Left); Carpal tunnel release (Right); Nose surgery; Partial hysterectomy; Total shoulder replacement (Bilateral); and Colonoscopy with propofol (N/A, 02/05/2017). Danielle [name] has a current medication list which includes the following prescription(s): albuterol, azelastine hcl, cetirizine, cholecalciferol, citalopram, cyclobenzaprine, diclofenac sodium, docusate sodium, levothyroxine, magnesium hydroxide, naloxone, and nystatin, and the following Facility-Administered Medications: fentanyl, lactated ringers, and pentafluoroprop-tetrafluoroeth. Her primarily concern today is the Neck Pain  Initial Vital Signs:  Pulse/HCG Rate: 88ECG Heart Rate: 84 (NSR) Temp: (!) 97.2 F (36.2 C) Resp: 18 BP: 116/88 SpO2: 99 %  BMI: Estimated body mass index is 37.94 kg/m as calculated from the following:   Height as of this encounter: '5\' 5"'$  (1.651 m).   Weight as of this encounter: 228 lb (103.4 kg).  Risk Assessment: Allergies: Reviewed. She is allergic to aripiprazole, duloxetine, gabapentin, gold, iodinated contrast media, naproxen sodium, nortriptyline hcl, nsaids, pregabalin, meperidine, ace inhibitors, aspirin, cefpodoxime, fluoxetine, fluticasone-salmeterol, levofloxacin, lithium, paroxetine, tape, telithromycin, theophylline, topiramate, trazodone, triamcinolone, venlafaxine, bupropion, ketorolac tromethamine, morphine, and moxifloxacin.  Allergy Precautions: None required Coagulopathies: Reviewed. None identified.  Blood-thinner therapy: None at this time Active Infection(s):  Reviewed. None identified. Danielle [name] is afebrile  Site Confirmation: Danielle [name] was asked to confirm the procedure and laterality before marking the site Procedure checklist: Completed Consent: Before the procedure and under the influence of no sedative(s), amnesic(s), or anxiolytics, the patient was informed of the treatment options, risks and  possible complications. To fulfill our ethical and legal obligations, as recommended by the American Medical Association's Code of Ethics, I have informed the patient of my clinical impression; the nature and purpose of the treatment or procedure; the risks, benefits, and possible complications of the intervention; the alternatives, including doing nothing; the risk(s) and benefit(s) of the alternative treatment(s) or procedure(s); and the risk(s) and benefit(s) of doing nothing. The patient was provided information about the general risks and possible complications associated with the procedure. These may include, but are not limited to: failure to achieve desired goals, infection, bleeding, organ or nerve damage, allergic reactions, paralysis, and death. In addition, the patient was informed of those risks and complications associated to Spine-related procedures, such as failure to decrease pain; infection (i.e.: Meningitis, epidural or intraspinal abscess); bleeding (i.e.: epidural hematoma, subarachnoid hemorrhage, or any other type of intraspinal or peri-dural bleeding); organ or nerve damage (i.e.: Any type of peripheral nerve, nerve root, or spinal cord injury) with subsequent damage to sensory, motor, and/or autonomic systems, resulting in permanent pain, numbness, and/or weakness of one or several areas of the body; allergic reactions; (i.e.: anaphylactic reaction); and/or death. Furthermore, the patient was informed of those risks and complications associated with the medications. These include, but are not limited to: allergic reactions (i.e.:  anaphylactic or anaphylactoid reaction(s)); adrenal axis suppression; blood sugar elevation that in diabetics may result in ketoacidosis or comma; water retention that in patients with history of congestive heart failure may result in shortness of breath, pulmonary edema, and decompensation with resultant heart failure; weight gain; swelling or edema; medication-induced neural toxicity; particulate matter embolism and blood vessel occlusion with resultant organ, and/or nervous system infarction; and/or aseptic necrosis of one or more joints. Finally, the patient was informed that Medicine is not an exact science; therefore, there is also the possibility of unforeseen or unpredictable risks and/or possible complications that may result in a catastrophic outcome. The patient indicated having understood very clearly. We have given the patient no guarantees and we have made no promises. Enough time was given to the patient to ask questions, all of which were answered to the patient's satisfaction. Danielle [name] has indicated that she wanted to continue with the procedure. Attestation: I, the ordering provider, attest that I have discussed with the patient the benefits, risks, side-effects, alternatives, likelihood of achieving goals, and potential problems during recovery for the procedure that I have provided informed consent. Date  Time: 04/11/2022  8:35 AM  Pre-Procedure Preparation:  Monitoring: As per clinic protocol. Respiration, ETCO2, SpO2, BP, heart rate and rhythm monitor placed and checked for adequate function Safety Precautions: Patient was assessed for positional comfort and pressure points before starting the procedure. Time-out: I initiated and conducted the "Time-out" before starting the procedure, as per protocol. The patient was asked to participate by confirming the accuracy of the "Time Out" information. Verification of the correct person, site, and procedure were performed and confirmed by  me, the nursing staff, and the patient. "Time-out" conducted as per Joint Commission's Universal Protocol (UP.01.01.01). Time: 0901  Description/Narrative of Procedure:          Laterality: Right Targeted Levels:  C3, C4, C5, C6, & C7 Medial Branch Level(s).  Rationale (medical necessity): procedure needed and proper for the diagnosis and/or treatment of the patient's medical symptoms and needs. Procedural Technique Safety Precautions: Aspiration looking for blood return was conducted prior to all injections. At no point did we inject any substances, as a needle was being  advanced. No attempts were made at seeking any paresthesias. Safe injection practices and needle disposal techniques used. Medications properly checked for expiration dates. SDV (single dose vial) medications used. Description of the Procedure: Protocol guidelines were followed. The patient was assisted into a comfortable position. The target area was identified and the area prepped in the usual manner. Skin & deeper tissues infiltrated with local anesthetic. Appropriate amount of time allowed to pass for local anesthetics to take effect. The procedure needles were then advanced to the target area. Proper needle placement secured. Negative aspiration confirmed. Solution injected in intermittent fashion, asking for systemic symptoms every 0.5cc of injectate. The needles were then removed and the area cleansed, making sure to leave some of the prepping solution back to take advantage of its long term bactericidal properties.  Technical description of process:  C3 Medial Branch Nerve Block (MBB): The target area for the C3 dorsal medial articular branch is the lateral concave waist of the articular pillar of C3. Under fluoroscopic guidance, a Quincke needle was inserted until contact was made with os over the postero-lateral aspect of the articular pillar of C3 (target area). After negative aspiration for blood, 0.5 mL of the nerve block  solution was injected without difficulty or complication. The needle was removed intact. C4 Medial Branch Nerve Block (MBB): The target area for the C4 dorsal medial articular branch is the lateral concave waist of the articular pillar of C4. Under fluoroscopic guidance, a Quincke needle was inserted until contact was made with os over the postero-lateral aspect of the articular pillar of C4 (target area). After negative aspiration for blood, 0.5 mL of the nerve block solution was injected without difficulty or complication. The needle was removed intact. C5 Medial Branch Nerve Block (MBB): The target area for the C5 dorsal medial articular branch is the lateral concave waist of the articular pillar of C5. Under fluoroscopic guidance, a Quincke needle was inserted until contact was made with os over the postero-lateral aspect of the articular pillar of C5 (target area). After negative aspiration for blood, 0.5 mL of the nerve block solution was injected without difficulty or complication. The needle was removed intact. C6 Medial Branch Nerve Block (MBB): The target area for the C6 dorsal medial articular branch is the lateral concave waist of the articular pillar of C6. Under fluoroscopic guidance, a Quincke needle was inserted until contact was made with os over the postero-lateral aspect of the articular pillar of C6 (target area). After negative aspiration for blood, 0.5 mL of the nerve block solution was injected without difficulty or complication. The needle was removed intact. C7 Medial Branch Nerve Block (MBB): The target for the C7 dorsal medial articular branch lies on the superior-medial tip of the C7 transverse process. Under fluoroscopic guidance, a Quincke needle was inserted until contact was made with os over the postero-lateral aspect of the articular pillar of C7 (target area). After negative aspiration for blood, 0.5 mL of the nerve block solution was injected without difficulty or  complication. The needle was removed intact.  Once the entire procedure was completed, the treated area was cleaned, making sure to leave some of the prepping solution back to take advantage of its long term bactericidal properties.  Anatomy Reference Guide:       Vitals:   04/11/22 0911 04/11/22 0922 04/11/22 0931 04/11/22 0942  BP: 114/72 111/70 (!) 114/93 118/64  Pulse:      Resp: '17 14 14 16  '$ Temp:  TempSrc:      SpO2: 100% 99% 99% (!) 66%  Weight:      Height:         Start Time: 0901 hrs. End Time: 0911 hrs.  Imaging Guidance (Spinal):          Type of Imaging Technique: Fluoroscopy Guidance (Spinal) Indication(s): Assistance in needle guidance and placement for procedures requiring needle placement in or near specific anatomical locations not easily accessible without such assistance. Exposure Time: Please see nurses notes. Contrast: None used. Fluoroscopic Guidance: I was personally present during the use of fluoroscopy. "Tunnel Vision Technique" used to obtain the best possible view of the target area. Parallax error corrected before commencing the procedure. "Direction-depth-direction" technique used to introduce the needle under continuous pulsed fluoroscopy. Once target was reached, antero-posterior, oblique, and lateral fluoroscopic projection used confirm needle placement in all planes. Images permanently stored in EMR. Interpretation: No contrast injected. I personally interpreted the imaging intraoperatively. Adequate needle placement confirmed in multiple planes. Permanent images saved into the patient's record.  Post-operative Assessment:  Post-procedure Vital Signs:  Pulse/HCG Rate: 8875 Temp: (!) 97.2 F (36.2 C) Resp: 16 BP: 118/64 SpO2: (!) 66 %  EBL: None  Complications: No immediate post-treatment complications observed by team, or reported by patient.  Note: The patient tolerated the entire procedure well. A repeat set of vitals were taken  after the procedure and the patient was kept under observation following institutional policy, for this type of procedure. Post-procedural neurological assessment was performed, showing return to baseline, prior to discharge. The patient was provided with post-procedure discharge instructions, including a section on how to identify potential problems. Should any problems arise concerning this procedure, the patient was given instructions to immediately contact us, at any time, without hesitation. In any case, we plan to contact the patient by telephone for a follow-up status report regarding this interventional procedure.  Comments:  No additional relevant information.  Plan of Care  Orders:  Orders Placed This Encounter  Procedures   CERVICAL FACET (MEDIAL BRANCH NERVE BLOCK)     Scheduling Instructions:     Side: Right-sided     Level: C3-4, C4-5, C5-6 Facet joints (C3, C4, C5, C6, & C7 Medial Branch Nerves)     Sedation: Patient's choice.     Timeframe: Today    Order Specific Question:   Where will this procedure be performed?    Answer:   [facility]   DG [facility] C-ARM 1-60 MIN NO REPORT    Intraoperative interpretation by procedural physician at [place].    Standing Status:   Standing    Number of Occurrences:   1    Order Specific Question:   Reason for exam:    Answer:   Assistance in needle guidance and placement for procedures requiring needle placement in or near specific anatomical locations not easily accessible without such assistance.   Informed Consent Details: Physician/Practitioner Attestation; Transcribe to consent form and obtain patient signature    Note: Always confirm laterality of pain with Ms. Domingo Cocking, before procedure. Transcribe to consent form and obtain patient signature.    Order Specific Question:   Physician/Practitioner attestation of informed consent for procedure/surgical case    Answer:   I, the physician/practitioner, attest  that I have discussed with the patient the benefits, risks, side effects, alternatives, likelihood of achieving goals and potential problems during recovery for the procedure that I have provided informed consent.    Order Specific Question:  Procedure    Answer:   Right Cervical facet block under fluoroscopic guidance.    Order Specific Question:   Physician/Practitioner performing the procedure    Answer:   Chela Sutphen A. [name], MD    Order Specific Question:   Indication/Reason    Answer:   Chronic neck pain secondary to cervical facet syndrome   Provide equipment / supplies at bedside    "Block Tray" (Disposable  single use) Needle type: SpinalSpinal Amount/quantity: 5 Size: Regular (3.5-inch) Gauge: 22G    Standing Status:   Standing    Number of Occurrences:   1    Order Specific Question:   Specify    Answer:   Block Tray   Miscellanous precautions    Standing Status:   Standing    Number of Occurrences:   1   Chronic Opioid Analgesic:  No chronic opioid analgesics therapy prescribed by our practice. (11/21/2021) UDS. (+) COCAINE. D/C'ed: Oxycodone IR 10 mg, 1 tab PO q 6 hrs (40 mg/day of oxycodone) MME/day: 60 mg/day.   Medications ordered for procedure: Meds ordered this encounter  Medications   lidocaine (XYLOCAINE) 2 % (with pres) injection 400 mg   pentafluoroprop-tetrafluoroeth (GEBAUERS) aerosol   lactated ringers infusion   midazolam (VERSED) 5 MG/5ML injection 0.5-2 mg    Make sure Flumazenil is available in the pyxis when using this medication. If oversedation occurs, administer 0.2 mg IV over 15 sec. If after 45 sec no response, administer 0.2 mg again over 1 min; may repeat at 1 min intervals; not to exceed 4 doses (1 mg)   fentaNYL (SUBLIMAZE) injection 25-50 mcg    Make sure Narcan is available in the pyxis when using this medication. In the event of respiratory depression (RR< 8/min): Titrate NARCAN (naloxone) in increments of 0.1 to 0.2 mg IV at 2-3 minute  intervals, until desired degree of reversal.   ropivacaine (PF) 2 mg/mL (0.2%) (NAROPIN) injection 9 mL   dexamethasone (DECADRON) injection 10 mg   Medications administered: We administered lidocaine, lactated ringers, midazolam, fentaNYL, ropivacaine (PF) 2 mg/mL (0.2%), and dexamethasone.  See the medical record for exact dosing, route, and time of administration.  Follow-up plan:   Return in about 2 weeks (around 04/25/2022) for Proc-day (T,Th), (VV), (PPE).       Interventional Therapies  Risk  Complexity Considerations:   Estimated body mass index is 38.27 kg/m as calculated from the following:   Height as of this encounter: '5\' 5"'$  (1.651 m).   Weight as of this encounter: 230 lb (104.3 kg). 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  Allergic to NSAIDs.  Abnormal UDS (01/02/2016). (+) Unreported tramadol.   Planned  Pending:   Diagnostic/therapeutic right cervical facet MBB #2    Under consideration:   Diagnostic caudal ESI + epidurogram  Diagnostic right CESI  Diagnostic/therapeutic right cervical facet MBB #2 Diagnostic left SI Blk    Completed:   Therapeutic bilateral IA hip + peri-articular bursae inj.  #B2 R4 L5 (01/31/2022) (100/100/85/85)  Palliative left trochanteric bursa x2 (09/18/2021) (100/100/90)  Palliative right trochanteric bursa x1 (01/25/2021) (100/100/90)  Palliative right suprascapular NB x3 (08/08/2020) (50/50/100/90-100)   Palliative left suprascapular NB x4 (02/22/2021) (100/100/90/90)  Palliative bilateral suprascapular nerve RFA x1 (05/13/2019) (100/100/90/90-100)  Therapeutic right IA hip joint injection x3 (06/01/2020) (100/100/100/90-100)  Therapeutic left IA hip joint injection x4 (09/18/2021) (100/100/90)  Therapeutic bilateral IA hip joint inj. x1 (12/03/2021) (by Dr. Gillis Santa) (100/100/90)  Diagnostic bilateral lumbar facet MBB x3 (09/30/2017) (100/75/60)  Palliative right lumbar facet RFA x2 (12/16/2017) (100/100/85)   Palliative left lumbar facet RFA x2 (02/12/2018) [facility])  Diagnostic bilateral cervical facet MBB x1 (09/30/2016) (90/100/0)  Diagnostic right SI joint block x1 (06/12/2017) (DNKFU -0/0/80)    Therapeutic  Palliative (PRN) options:   Therapeutic trochanteric bursa   Therapeutic suprascapular NB   Therapeutic suprascapular nerve RFA   Therapeutic IA hip joint injection   Therapeutic lumbar facet MBB   Therapeutic lumbar facet RFA   Therapeutic SI Blk          Recent Visits Date Type Provider Dept  03/27/22 Office Visit Milinda Pointer, MD [facility]  03/06/22 Office Visit Milinda Pointer, [facility]  02/14/22 Office Visit Milinda Pointer, MD [facility]  01/31/22 Procedure visit Milinda Pointer, MD [facility]  01/21/22 Office Visit Milinda Pointer, MD [facility]  Showing recent visits within past 90 days and meeting all other requirements Today's Visits Date Type Provider Dept  04/11/22 Procedure visit Milinda Pointer, MD [facility]  Showing today's visits and meeting all other requirements Future Appointments Date Type Provider Dept  04/25/22 Appointment Milinda Pointer, MD [facility]  Showing future appointments within next 90 days and meeting all other requirements  Disposition: Discharge home  Discharge (Date  Time): 04/11/2022; 0945 hrs.   Primary Care Physician: Danae Orleans, MD Location: [facility] Note by: Gaspar Cola, MD Date: 04/11/2022; Time: 10:05 AM  Disclaimer:  Medicine is not an Chief Strategy Officer. The only guarantee in medicine is that nothing is guaranteed. It is important to note that the decision to proceed with this intervention was based on the information collected from the patient. The Data and conclusions were drawn from the patient's questionnaire, the interview, and the physical examination. Because the information was  provided in large part by the patient, it cannot be guaranteed that it has not been purposely or unconsciously manipulated. Every effort has been made to obtain as much relevant data as possible for this evaluation. It is important to note that the conclusions that lead to this procedure are derived in large part from the available data. Always take into account that the treatment will also be dependent on availability of resources and existing treatment guidelines, considered by other Pain Management Practitioners as being common knowledge and practice, at the time of the intervention. For Medico-Legal purposes, it is also important to point out that variation in procedural techniques and pharmacological choices are the acceptable norm. The indications, contraindications, technique, and results of the above procedure should only be interpreted and judged by a Board-Certified Interventional Pain Specialist with extensive familiarity and expertise in the same exact procedure and technique.

## 2022-04-12 ENCOUNTER — Telehealth: Payer: Self-pay

## 2022-04-12 NOTE — Telephone Encounter (Signed)
Called PP. Denies any needs at this time. Instructed to call if needed. 

## 2022-04-21 NOTE — Progress Notes (Signed)
Patient: Danielle [name]  Service Category: E/M  Provider: Gaspar Cola, MD  DOB: 1961-02-18  DOS: 04/25/2022  Location: Office  MRN: [id number]  Setting: Ambulatory outpatient  Referring Provider: Danae Orleans, MD  Type: Established Patient  Specialty: Interventional Pain Management  PCP: Danae Orleans, MD  Location: Remote location  Delivery: TeleHealth     Virtual Encounter - Pain Management PROVIDER NOTE: Information contained herein reflects review and annotations entered in association with encounter. Interpretation of such information and data should be left to medically-trained personnel. Information provided to patient can be located elsewhere in the medical record under "Patient Instructions". Document created using STT-dictation technology, any transcriptional errors that may result from process are unintentional.    Contact & Pharmacy Preferred: [phone] Home: [phone] (home) Mobile: [phone] (mobile) E-mail: [email or web address]  [place], [place] [address] [place] [address] Phone: [phone] Fax: [place], [place] - [place] [facility] [place] [address] Phone: [phone] Fax: [phone]   Pre-screening  Danielle [name] offered "in-person" vs "virtual" encounter. She indicated preferring virtual for this encounter.   Reason COVID-19*  Social distancing based on CDC and AMA recommendations.   I contacted Danielle [name] on 04/25/2022 via telephone.      I clearly identified myself as Gaspar Cola, MD. I verified that I was speaking with the correct person using two identifiers (Name: Danielle [name], and date of birth: [birth year]).  Consent I sought verbal advanced consent from Danielle [name] for virtual visit interactions. I informed Danielle [name] of possible security and privacy concerns, risks, and limitations associated  with providing "not-in-person" medical evaluation and management services. I also informed Danielle [name] of the availability of "in-person" appointments. Finally, I informed her that there would be a charge for the virtual visit and that she could be  personally, fully or partially, financially responsible for it. Danielle [name] expressed understanding and agreed to proceed.   Historic Elements   Danielle [name] is a 61 y.o. year old, female patient evaluated today after our last contact on 04/11/2022. Danielle [name]  has a past medical history of Acute postoperative pain (01/13/2017), Anxiety, Arthritis, Asthma, Bell's palsy, Bursitis, COPD (chronic obstructive pulmonary disease) ([place]), Depression, Fibromyalgia, Heart murmur, Hepatitis C, Hiatal hernia, Hyperlipidemia, Hypertension, IBS (irritable bowel syndrome), Insomnia, and Thyroid disease. She also  has a past surgical history that includes necksurgery (10/21/2014); Back surgery (2013); Foot surgery (Right); Elbow surgery (Left); Carpal tunnel release (Right); Nose surgery; Partial hysterectomy; Total shoulder replacement (Bilateral); and Colonoscopy with propofol (N/A, 02/05/2017). Danielle [name] has a current medication list which includes the following prescription(s): albuterol, azelastine hcl, cetirizine, cholecalciferol, citalopram, cyclobenzaprine, docusate sodium, levothyroxine, magnesium hydroxide, naloxone, nystatin, and diclofenac sodium. She  reports that she has been smoking cigarettes. She has a 80.00 pack-year smoking history. She has never used smokeless tobacco. She reports that she does not drink alcohol and does not use drugs. Danielle [name] is allergic to aripiprazole, duloxetine, gabapentin, gold, iodinated contrast media, naproxen sodium, nortriptyline hcl, nsaids, pregabalin, meperidine, ace inhibitors, aspirin, cefpodoxime, fluoxetine, fluticasone-salmeterol, levofloxacin, lithium, paroxetine, tape, telithromycin, theophylline, topiramate,  trazodone, triamcinolone, venlafaxine, bupropion, ketorolac tromethamine, morphine, and moxifloxacin.   HPI  Today, she is being contacted for a post-procedure assessment.  The patient indicates having attained an ongoing 100% relief of her right neck pain.  This confirms that this pain  is coming from the facet joints.  Today I have talked to the patient about the possibility of radiofrequency ablation.  The patient indicates that her hip pain has come back and it is extremely debilitating.  Today I asked the patient to perform a Patrick maneuver at home and she indicated that it was positive on the right side for sacroiliac joint arthralgia as well as hip joint arthralgia.  On the left it was also positive for both, but she indicates that indicates of the hip, the right side seems to be worse than the left and the same is true for the sacroiliac joint.  She describes having this area right above her right buttocks where she has pain that she describes as significant.  This year we have already done bilateral hip joint injections on 12/03/2021 and 01/31/2022, both times with excellent relief, but unfortunately the inflammation continues to return.  The left side was done 1 additional time on 09/18/2021, also with good results.  Today I have talked to the patient about the issues associated with doing multiple injections into the large joints and the risks of complications.  After having explained all this to the patient I asked her if she was interested in having the injections done again and she indicated that she would like to have both hips and the right SI joint done as well as both of her trochanteric bursa's.  Today I will put an order to see if we can get that approved and will contact her to schedule the soonest we have that approval.  Post-procedure evaluation   Type: Cervical Facet Medial Branch Block(s) #2  Laterality: Right  Level: C3, C4, C5, C6, & C7 Medial Branch Level(s). Injecting these levels  blocks the C3-4, C4-5, C5-6, and C6-7 cervical facet joints.  Imaging: Fluoroscopic guidance Anesthesia: Local anesthesia (1-2% Lidocaine) Anxiolysis: IV Versed 3.0 mg Sedation: Moderate Sedation Fentanyl 2.0 mL (100 mcg) DOS: 04/11/2022  Performed by: Gaspar Cola, MD  Purpose: Diagnostic/Therapeutic Indications: Cervicalgia (cervical spine axial pain) severe enough to impact quality of life or function. 1. Cervical facet syndrome (HCC)   2. Spondylosis without myelopathy or radiculopathy, cervical region   3. DDD (degenerative disc disease), cervical   4. Cervicalgia   5. Morbid obesity with BMI of 40.0-44.9, adult (HCC)    NAS-11 Pain score:   Pre-procedure: 1 /10   Post-procedure: 0-No pain/10      Effectiveness:  Initial hour after procedure: 100 %. Subsequent 4-6 hours post-procedure: 100 %. Analgesia past initial 6 hours: 100 % (ongoing). Ongoing improvement:  Analgesic: The patient indicates currently having an ongoing 100% relief of her left-sided neck pain confirming that the etiology of the pain is that of the left cervical facet joints and that the mechanism sustaining that pain was related to an inflammatory process. Function: Danielle [name] reports improvement in function ROM: Danielle [name] reports improvement in ROM  Pharmacotherapy Assessment   Opioid Analgesic: No chronic opioid analgesics therapy prescribed by our practice.  Discontinued (11/21/2021) UDS. (+) COCAINE.    Monitoring: [place] PMP: PDMP reviewed during this encounter.       Pharmacotherapy: No side-effects or adverse reactions reported. Compliance: No problems identified. Effectiveness: Clinically acceptable. Plan: Refer to "POC". UDS:  Summary  Date Value Ref Range Status  11/21/2021 Note  Final    Comment:    ==================================================================== [employer] Select 13 (MW) ==================================================================== Test  Result       Flag       Units  Drug Present and Declared for Prescription Verification   Oxycodone                      >2433        EXPECTED   ng/mg creat   Oxymorphone                    164          EXPECTED   ng/mg creat   Noroxycodone                   >2433        EXPECTED   ng/mg creat   Noroxymorphone                 58           EXPECTED   ng/mg creat    Sources of oxycodone are scheduled prescription medications.    Oxymorphone, noroxycodone, and noroxymorphone are expected    metabolites of oxycodone. Oxymorphone is also available as a    scheduled prescription medication.  Drug Present not Declared for Prescription Verification   Methamphetamine                12           UNEXPECTED ng/mg creat    Sources of methamphetamine include illicit sources, as a scheduled    prescription medication, as a metabolite of some prescription drugs,    or use of an l-methamphetamine inhaler.    Oxazepam                       17           UNEXPECTED ng/mg creat    Oxazepam may be administered as a scheduled prescription medication;    it is also an expected metabolite of other benzodiazepine drugs,    including diazepam, chlordiazepoxide, prazepam, clorazepate,    halazepam, and temazepam.    Benzoylecgonine                38           UNEXPECTED ng/mg creat    Benzoylecgonine is a metabolite of cocaine; its presence indicates    use of this drug.  Source is most commonly illicit, but cocaine is    present in some topical anesthetic solutions.  ==================================================================== Test                      Result    Flag   Units      Ref Range   Creatinine              411              mg/dL      >=20 ==================================================================== Declared Medications:  The flagging and interpretation on this report are based on the  following declared medications.  Unexpected results may arise from  inaccuracies in the  declared medications.   **Note: The testing scope of this panel includes these medications:   Oxycodone   **Note: The testing scope of this panel does not include the  following reported medications:   Albuterol  Azelastine  Cetirizine  Citalopram (Celexa)  Diclofenac (Voltaren)  Docusate (Colace)  Fluticasone (Trelegy)  Guaifenesin (Mucinex)  Levothyroxine (Synthroid)  Naloxone  Nystatin  Supplement  Tizanidine (Zanaflex)  Umeclidinium (Trelegy)  Vilanterol (Trelegy)  Vitamin D ==================================================================== For clinical consultation, please call [phone]. ====================================================================    No results found for: "CBDTHCR", "D8THCCBX", "D9THCCBX"   Laboratory Chemistry Profile   Renal Lab Results  Component Value Date   BUN 14 04/28/2019   CREATININE 0.98 04/28/2019   BCR 13 02/16/2019   GFRAA >60 04/28/2019   GFRNONAA >60 04/28/2019    Hepatic Lab Results  Component Value Date   AST 18 04/28/2019   ALT 16 04/28/2019   ALBUMIN 3.7 04/28/2019   ALKPHOS 80 04/28/2019    Electrolytes Lab Results  Component Value Date   NA 139 04/28/2019   K 4.1 04/28/2019   CL 105 04/28/2019   CALCIUM 9.3 04/28/2019   MG 2.1 02/16/2019    Bone Lab Results  Component Value Date   25OHVITD1 27 (L) 02/16/2019   25OHVITD2 2.0 02/16/2019   25OHVITD3 25 02/16/2019    Inflammation (CRP: Acute Phase) (ESR: Chronic Phase) Lab Results  Component Value Date   CRP 2 02/16/2019   ESRSEDRATE 23 02/16/2019         Note: Above Lab results reviewed.  Imaging  DG PAIN CLINIC C-ARM 1-60 MIN NO REPORT Fluoro was used, but no Radiologist interpretation will be provided.  Please refer to "NOTES" tab for provider progress note.  Assessment  The primary encounter diagnosis was Cervicalgia. 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  Plan of Care  Problem-specific:  No problem-specific Assessment & Plan notes found for this encounter.  Danielle [name] has a current medication list which includes the following long-term medication(s): albuterol, azelastine hcl, citalopram, levothyroxine, and diclofenac sodium.  Pharmacotherapy (Medications Ordered): No orders of the defined types were placed in this encounter.  Orders:  Orders Placed This Encounter  Procedures   HIP INJECTION    Standing Status:   Future    Standing Expiration Date:   07/26/2022    Scheduling Instructions:     Procedure: Bilateral intra-articular hip joint injection + bilateral trochanteric bursa injection     Side: Bilateral     Sedation: Patient's choice.     Timeframe: ASAA   SACROILIAC JOINT INJECTION    Standing Status:   Future    Standing Expiration Date:   07/26/2022    Scheduling Instructions:     Side: Right-sided     Sedation: Patient's choice.     Timeframe: ASAA    Order Specific Question:   Where will this procedure be performed?    Answer:   [facility]   Follow-up plan:   Return for procedure (ECT): (B) IA Hip & TB inj. + (R) SI Blk.     Interventional Therapies  Risk  Complexity Considerations:   Estimated body mass index is 38.27 kg/m as calculated from the following:   Height as of this encounter: '5\' 5"'  (1.651 m).   Weight as of this encounter: 230 lb (104.3 kg). 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  Allergic to NSAIDs.  Abnormal UDS (01/02/2016). (+) Unreported  tramadol.   Planned  Pending:   Therapeutic/palliative bilateral IA hip  joint + trochanteric bursa + right sacroiliac joint injection.    Under consideration:   Diagnostic caudal ESI + epidurogram  Diagnostic right CESI  Diagnostic/therapeutic right cervical facet MBB #2 Diagnostic left SI Blk    Completed:   Therapeutic bilateral IA hip + peri-articular bursae inj.  #B2 R4 L5 (01/31/2022) (100/100/85/85)  Palliative left trochanteric bursa x2 (09/18/2021) (100/100/90)  Palliative right trochanteric bursa x1 (01/25/2021) (100/100/90)  Palliative right suprascapular NB x3 (08/08/2020) (50/50/100/90-100)   Palliative left suprascapular NB x4 (02/22/2021) (100/100/90/90)  Palliative bilateral suprascapular nerve RFA x1 (05/13/2019) (100/100/90/90-100)  Therapeutic right IA hip joint injection x3 (06/01/2020) (100/100/100/90-100)  Therapeutic left IA hip joint injection x4 (09/18/2021) (100/100/90)  Therapeutic bilateral IA hip joint inj. x1 (12/03/2021) (by Dr. Gillis Santa) (100/100/90)  Diagnostic bilateral lumbar facet MBB x3 (09/30/2017) (100/75/60)  Palliative right lumbar facet RFA x2 (12/16/2017) (100/100/85)  Palliative left lumbar facet RFA x2 (02/12/2018) [facility])  Diagnostic right cervical facet MBB x2 (04/11/2022) (100/100/100/100)  Diagnostic left cervical facet MBB x1 (09/30/2016) (90/100/0)  Diagnostic right SI joint block x1 (06/12/2017) (DNKFU -0/0/80)    Therapeutic  Palliative (PRN) options:   Therapeutic trochanteric bursa   Therapeutic suprascapular NB   Therapeutic suprascapular nerve RFA   Therapeutic IA hip joint injection   Therapeutic lumbar facet MBB   Therapeutic lumbar facet RFA   Therapeutic SI Blk      Recent Visits Date Type Provider Dept  04/22/22 Office Visit Milinda Pointer, MD [facility]  04/11/22 Procedure visit Milinda Pointer, MD [facility]  03/27/22 Office Visit Milinda Pointer, MD [facility]  03/06/22  Office Visit Milinda Pointer, MD [facility]  02/14/22 Office Visit Milinda Pointer, MD [facility]  01/31/22 Procedure visit Milinda Pointer, MD [facility]  Showing recent visits within past 90 days and meeting all other requirements Today's Visits Date Type Provider Dept  04/25/22 Office Visit Milinda Pointer, MD [facility]  Showing today's visits and meeting all other requirements Future Appointments No visits were found meeting these conditions. Showing future appointments within next 90 days and meeting all other requirements  I discussed the assessment and treatment plan with the patient. The patient was provided an opportunity to ask questions and all were answered. The patient agreed with the plan and demonstrated an understanding of the instructions.  Patient advised to call back or seek an in-person evaluation if the symptoms or condition worsens.  Duration of encounter: 18 minutes.  Note by: Gaspar Cola, MD Date: 04/25/2022; Time: 12:50 PM

## 2022-04-22 ENCOUNTER — Encounter: Payer: Self-pay | Admitting: Pain Medicine

## 2022-04-22 ENCOUNTER — Ambulatory Visit: Payer: Medicaid Other | Attending: Pain Medicine | Admitting: Pain Medicine

## 2022-04-22 ENCOUNTER — Telehealth: Payer: Self-pay | Admitting: Pain Medicine

## 2022-04-22 VITALS — BP 127/79 | HR 83 | Temp 98.2°F | Resp 18 | Ht 65.0 in | Wt 230.0 lb

## 2022-04-22 DIAGNOSIS — G894 Chronic pain syndrome: Secondary | ICD-10-CM | POA: Diagnosis present

## 2022-04-22 DIAGNOSIS — M545 Low back pain, unspecified: Secondary | ICD-10-CM | POA: Diagnosis present

## 2022-04-22 DIAGNOSIS — G8929 Other chronic pain: Secondary | ICD-10-CM | POA: Diagnosis present

## 2022-04-22 MED ORDER — KETOROLAC TROMETHAMINE 60 MG/2ML IM SOLN
60.0000 mg | Freq: Once | INTRAMUSCULAR | Status: AC
  Administered 2022-04-22: 60 mg via INTRAMUSCULAR
  Filled 2022-04-22: qty 2

## 2022-04-22 NOTE — Telephone Encounter (Signed)
Patient has been having increased pain since Friday, wants to know if she can get toradol/norflex this afternoon ?

## 2022-04-22 NOTE — Progress Notes (Signed)
Patient: Danielle [name]  Service Category: E/M  Provider: Gaspar Cola, MD  DOB: 12/29/1960  DOS: 04/22/2022  Referring Provider: Danae Orleans, MD  MRN: [id number]  Setting: Ambulatory outpatient  PCP: Danae Orleans, MD  Type: Established Patient  Specialty: Interventional Pain Management    Location: Office  Delivery: Face-to-face     Purpose:  The patient comes in today for IM therapy. Case was discussed with attending physician.  Subjective:  Ms. MALAYIA SPIZZIRRI is a 61 y.o. year old, female patient, who comes today for a nurse visit complaining of Back Pain (Right, lower) Her last contact with [place] was on 04/22/2022. Severity of the pain is described as a  /10.   No notes on file  Objective:  Ms. Marschner  height is '5\' 5"'$  (1.651 m) and weight is 230 lb (104.3 kg). Her temporal temperature is 98.2 F (36.8 C). Her blood pressure is 127/79 and her pulse is 83. Her respiration is 18 and oxygen saturation is 100%.  Body mass index is 38.27 kg/m.  Analgesic:  No chronic opioid analgesics therapy prescribed by our practice. (11/21/2021) UDS. (+) COCAINE. D/C'ed: Oxycodone IR 10 mg, 1 tab PO q 6 hrs (40 mg/day of oxycodone) MME/day: 60 mg/day.   Allergies:  Patient is allergic to aripiprazole, duloxetine, gabapentin, gold, iodinated contrast media, naproxen sodium, nortriptyline hcl, nsaids, pregabalin, meperidine, ace inhibitors, aspirin, cefpodoxime, fluoxetine, fluticasone-salmeterol, levofloxacin, lithium, paroxetine, tape, telithromycin, theophylline, topiramate, trazodone, triamcinolone, venlafaxine, bupropion, ketorolac tromethamine, morphine, and moxifloxacin.  Labs:  Lab Results  Component Value Date   BUN 14 04/28/2019   CREATININE 0.98 04/28/2019   GFRAA >60 04/28/2019   GFRNONAA >60 04/28/2019    Assessment:  The primary encounter diagnosis was Acute exacerbation of chronic low back pain. A diagnosis of Chronic pain syndrome was also pertinent to this  visit.  Attestation: Medical screening examination/treatment/procedure(s) were performed by non-physician practitioner and as supervising physician I was immediately available for consultation/collaboration.  Plan of Care  Orders:  Orders Placed This Encounter  Procedures   Informed Consent Details: Physician/Practitioner Attestation; Transcribe to consent form and obtain patient signature    Do not administer NSAIDs (Toradol, etc.) if patient has an allergy or intolerance to NSAIDs or if patient has CKD (chronic kidney disease or failure). Avoid the use of Muscle Relaxants (orphenedrine/Norflex, etc.) if patient has allergy or intolerance to this medication.    Scheduling Instructions:     Nursing orders: Complete pain questionnaire before administration of therapy. Document location, laterality, onset, level, trigger, and current medications taken for the pain.    Order Specific Question:   Physician/Practitioner attestation of informed consent for procedure/surgical case    Answer:   I, the physician/practitioner, attest that I have discussed with the patient the benefits, risks, side effects, alternatives, likelihood of achieving goals and potential problems during recovery for the procedure that I have provided informed consent.    Order Specific Question:   Procedure    Answer:   IM injection of therapeutic substance    Order Specific Question:   Physician/Practitioner performing the procedure    Answer:   Lauriann Milillo A. [name], MD    Order Specific Question:   Indication/Reason    Answer:   Acute on chronic pain   Chronic Opioid Analgesic:  No chronic opioid analgesics therapy prescribed by our practice. (11/21/2021) UDS. (+) COCAINE. D/C'ed: Oxycodone IR 10 mg, 1 tab PO q 6 hrs (40 mg/day of oxycodone) MME/day: 60 mg/day.   Medications ordered  for procedure: Meds ordered this encounter  Medications   ketorolac (TORADOL) injection 60 mg   Medications administered: Ahlani A. Domingo Cocking  "ANGIE" had no medications administered during this visit.  See the medical record for exact dosing, route, and time of administration.  Follow-up plan:   Return for scheduled encounter.       Interventional Therapies  Risk  Complexity Considerations:   Estimated body mass index is 38.27 kg/m as calculated from the following:   Height as of this encounter: '5\' 5"'$  (1.651 m).   Weight as of this encounter: 230 lb (104.3 kg). 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  Allergic to NSAIDs.  Abnormal UDS (01/02/2016). (+) Unreported tramadol.   Planned  Pending:   Diagnostic/therapeutic right cervical facet MBB #2    Under consideration:   Diagnostic caudal ESI + epidurogram  Diagnostic right CESI  Diagnostic/therapeutic right cervical facet MBB #2 Diagnostic left SI Blk    Completed:   Therapeutic bilateral IA hip + peri-articular bursae inj.  #B2 R4 L5 (01/31/2022) (100/100/85/85)  Palliative left trochanteric bursa x2 (09/18/2021) (100/100/90)  Palliative right trochanteric bursa x1 (01/25/2021) (100/100/90)  Palliative right suprascapular NB x3 (08/08/2020) (50/50/100/90-100)   Palliative left suprascapular NB x4 (02/22/2021) (100/100/90/90)  Palliative bilateral suprascapular nerve RFA x1 (05/13/2019) (100/100/90/90-100)  Therapeutic right IA hip joint injection x3 (06/01/2020) (100/100/100/90-100)  Therapeutic left IA hip joint injection x4 (09/18/2021) (100/100/90)  Therapeutic bilateral IA hip joint inj. x1 (12/03/2021) (by Dr. Gillis Santa) (100/100/90)  Diagnostic bilateral lumbar facet MBB x3 (09/30/2017) (100/75/60)  Palliative right lumbar facet RFA x2 (12/16/2017) (100/100/85)  Palliative left lumbar facet RFA x2 (02/12/2018) [facility])  Diagnostic bilateral cervical facet MBB x1 (09/30/2016) (90/100/0)  Diagnostic right SI joint block x1 (06/12/2017) ([facility]NK[facility] -0/0/80)    Therapeutic  Palliative (PRN) options:   Therapeutic trochanteric bursa   Therapeutic  suprascapular NB   Therapeutic suprascapular nerve RFA   Therapeutic IA hip joint injection   Therapeutic lumbar facet MBB   Therapeutic lumbar facet RFA   Therapeutic SI Blk         Recent Visits Date Type Provider Dept  04/11/22 Procedure visit Milinda Pointer, MD [facility]  03/27/22 Office Visit Milinda Pointer, MD [facility]  03/06/22 Office Visit Milinda Pointer, MD [facility]  02/14/22 Office Visit Milinda Pointer, MD [facility]  01/31/22 Procedure visit Milinda Pointer, MD [facility]  Showing recent visits within past 90 days and meeting all other requirements Today's Visits Date Type Provider Dept  04/22/22 Office Visit Milinda Pointer, MD [facility]  Showing today's visits and meeting all other requirements Future Appointments Date Type Provider Dept  04/25/22 Appointment Milinda Pointer, MD [facility]  Showing future appointments within next 90 days and meeting all other requirements  Disposition: Discharge home  Discharge (Date  Time): 04/22/2022;   hrs.   Primary Care Physician: Danae Orleans, MD Location: [facility] Note by: Gaspar Cola, MD Date: 04/22/2022; Time: 1:32 PM  Disclaimer:  Medicine is not an exact science. The only guarantee in medicine is that nothing is guaranteed. It is important to note that the decision to proceed with this intervention was based on the information collected from the patient. The Data and conclusions were drawn from the patient's questionnaire, the interview, and the physical examination. Because the information was provided in large part by the patient, it cannot be guaranteed that it has not been purposely or unconsciously manipulated.  Every effort has been made to obtain as much relevant data as possible for this evaluation. It is important to note that the conclusions that lead to this procedure  are derived in large part from the available data. Always take into account that the treatment will also be dependent on availability of resources and existing treatment guidelines, considered by other Pain Management Practitioners as being common knowledge and practice, at the time of the intervention. For Medico-Legal purposes, it is also important to point out that variation in procedural techniques and pharmacological choices are the acceptable norm. The indications, contraindications, technique, and results of the above procedure should only be interpreted and judged by a Board-Certified Interventional Pain Specialist with extensive familiarity and expertise in the same exact procedure and technique.

## 2022-04-22 NOTE — Telephone Encounter (Signed)
Dr. [name] asked, ok to come as long as patient has no kidney dysfunction, and has not had a Toradol injection in past 2 weeks. Patient denies either of these.

## 2022-04-25 ENCOUNTER — Ambulatory Visit: Payer: Medicaid Other | Attending: Pain Medicine | Admitting: Pain Medicine

## 2022-04-25 DIAGNOSIS — S73191S Other sprain of right hip, sequela: Secondary | ICD-10-CM

## 2022-04-25 DIAGNOSIS — M7062 Trochanteric bursitis, left hip: Secondary | ICD-10-CM

## 2022-04-25 DIAGNOSIS — Z9889 Other specified postprocedural states: Secondary | ICD-10-CM

## 2022-04-25 DIAGNOSIS — M160 Bilateral primary osteoarthritis of hip: Secondary | ICD-10-CM

## 2022-04-25 DIAGNOSIS — M47812 Spondylosis without myelopathy or radiculopathy, cervical region: Secondary | ICD-10-CM

## 2022-04-25 DIAGNOSIS — G8929 Other chronic pain: Secondary | ICD-10-CM

## 2022-04-25 DIAGNOSIS — M542 Cervicalgia: Secondary | ICD-10-CM | POA: Diagnosis not present

## 2022-04-25 DIAGNOSIS — R935 Abnormal findings on diagnostic imaging of other abdominal regions, including retroperitoneum: Secondary | ICD-10-CM | POA: Diagnosis not present

## 2022-04-25 DIAGNOSIS — S73192S Other sprain of left hip, sequela: Secondary | ICD-10-CM

## 2022-04-25 DIAGNOSIS — M9904 Segmental and somatic dysfunction of sacral region: Secondary | ICD-10-CM

## 2022-04-25 DIAGNOSIS — M25551 Pain in right hip: Secondary | ICD-10-CM

## 2022-04-25 DIAGNOSIS — M25552 Pain in left hip: Secondary | ICD-10-CM

## 2022-04-25 DIAGNOSIS — M7061 Trochanteric bursitis, right hip: Secondary | ICD-10-CM

## 2022-04-25 DIAGNOSIS — G8928 Other chronic postprocedural pain: Secondary | ICD-10-CM

## 2022-04-25 DIAGNOSIS — M533 Sacrococcygeal disorders, not elsewhere classified: Secondary | ICD-10-CM

## 2022-04-25 NOTE — Patient Instructions (Signed)
______________________________________________________________________  Preparing for Procedure with Sedation  NOTICE: Due to recent regulatory changes, starting on February 12, 2021, procedures requiring intravenous (IV) sedation will no longer be performed at the [facility].  These types of procedures are required to be performed at [facility] ambulatory surgery facility.  We are very sorry for the inconvenience.  Procedure appointments are limited to planned procedures: No Prescription Refills. No disability issues will be discussed. No medication changes will be discussed.  Instructions: Oral Intake: Do not eat or drink anything for at least 8 hours prior to your procedure. (Exception: Blood Pressure Medication. See below.) Transportation: A driver is required. You may not drive yourself after the procedure. Blood Pressure Medicine: Do not forget to take your blood pressure medicine with a sip of water the morning of the procedure. If your Diastolic (lower reading) is above 100 mmHg, elective cases will be cancelled/rescheduled. Blood thinners: These will need to be stopped for procedures. Notify our staff if you are taking any blood thinners. Depending on which one you take, there will be specific instructions on how and when to stop it. Diabetics on insulin: Notify the staff so that you can be scheduled 1st case in the morning. If your diabetes requires high dose insulin, take only  of your normal insulin dose the morning of the procedure and notify the staff that you have done so. Preventing infections: Shower with an antibacterial soap the morning of your procedure. Build-up your immune system: Take 1000 mg of Vitamin C with every meal (3 times a day) the day prior to your procedure. Antibiotics: Inform the staff if you have a condition or reason that requires you to take antibiotics before dental procedures. Pregnancy: If you are pregnant, call and cancel the procedure. Sickness: If  you have a cold, fever, or any active infections, call and cancel the procedure. Arrival: You must be in the facility at least 30 minutes prior to your scheduled procedure. Children: Do not bring children with you. Dress appropriately: There is always the possibility that your clothing may get soiled. Valuables: Do not bring any jewelry or valuables.  Reasons to call and reschedule or cancel your procedure: (Following these recommendations will minimize the risk of a serious complication.) Surgeries: Avoid having procedures within 2 weeks of any surgery. (Avoid for 2 weeks before or after any surgery). Flu Shots: Avoid having procedures within 2 weeks of a flu shots. (Avoid for 2 weeks before or after immunizations). Barium: Avoid having a procedure within 7-10 days after having had a radiological study involving the use of radiological contrast. (Myelograms, Barium swallow or enema study). Heart attacks: Avoid any elective procedures or surgeries for the initial 6 months after a "Myocardial Infarction" (Heart Attack). Blood thinners: It is imperative that you stop these medications before procedures. Let us know if you if you take any blood thinner.  Infection: Avoid procedures during or within two weeks of an infection (including chest colds or gastrointestinal problems). Symptoms associated with infections include: Localized redness, fever, chills, night sweats or profuse sweating, burning sensation when voiding, cough, congestion, stuffiness, runny nose, sore throat, diarrhea, nausea, vomiting, cold or Flu symptoms, recent or current infections. It is specially important if the infection is over the area that we intend to treat. Heart and lung problems: Symptoms that may suggest an active cardiopulmonary problem include: cough, chest pain, breathing difficulties or shortness of breath, dizziness, ankle swelling, uncontrolled high or unusually low blood pressure, and/or palpitations. If you are    experiencing any of these symptoms, cancel your procedure and contact your primary care physician for an evaluation.  Remember:  Regular Business hours are:  Monday to Thursday 8:00 AM to 4:00 PM  Provider's Schedule: Jonet Mathies, MD:  Procedure days: Tuesday and Thursday 7:30 AM to 4:00 PM  Bilal Lateef, MD:  Procedure days: Monday and Wednesday 7:30 AM to 4:00 PM ______________________________________________________________________  ____________________________________________________________________________________________  General Risks and Possible Complications  Patient Responsibilities: It is important that you read this as it is part of your informed consent. It is our duty to inform you of the risks and possible complications associated with treatments offered to you. It is your responsibility as a patient to read this and to ask questions about anything that is not clear or that you believe was not covered in this document.  Patient's Rights: You have the right to refuse treatment. You also have the right to change your mind, even after initially having agreed to have the treatment done. However, under this last option, if you wait until the last second to change your mind, you may be charged for the materials used up to that point.  Introduction: Medicine is not an exact science. Everything in Medicine, including the lack of treatment(s), carries the potential for danger, harm, or loss (which is by definition: Risk). In Medicine, a complication is a secondary problem, condition, or disease that can aggravate an already existing one. All treatments carry the risk of possible complications. The fact that a side effects or complications occurs, does not imply that the treatment was conducted incorrectly. It must be clearly understood that these can happen even when everything is done following the highest safety standards.  No treatment: You can choose not to proceed with the  proposed treatment alternative. The "PRO(s)" would include: avoiding the risk of complications associated with the therapy. The "CON(s)" would include: not getting any of the treatment benefits. These benefits fall under one of three categories: diagnostic; therapeutic; and/or palliative. Diagnostic benefits include: getting information which can ultimately lead to improvement of the disease or symptom(s). Therapeutic benefits are those associated with the successful treatment of the disease. Finally, palliative benefits are those related to the decrease of the primary symptoms, without necessarily curing the condition (example: decreasing the pain from a flare-up of a chronic condition, such as incurable terminal cancer).  General Risks and Complications: These are associated to most interventional treatments. They can occur alone, or in combination. They fall under one of the following six (6) categories: no benefit or worsening of symptoms; bleeding; infection; nerve damage; allergic reactions; and/or death. No benefits or worsening of symptoms: In Medicine there are no guarantees, only probabilities. No healthcare provider can ever guarantee that a medical treatment will work, they can only state the probability that it may. Furthermore, there is always the possibility that the condition may worsen, either directly, or indirectly, as a consequence of the treatment. Bleeding: This is more common if the patient is taking a blood thinner, either prescription or over the counter (example: Goody Powders, Fish oil, Aspirin, Garlic, etc.), or if suffering a condition associated with impaired coagulation (example: Hemophilia, cirrhosis of the liver, low platelet counts, etc.). However, even if you do not have one on these, it can still happen. If you have any of these conditions, or take one of these drugs, make sure to notify your treating physician. Infection: This is more common in patients with a compromised  immune system, either due to disease (example:   diabetes, cancer, human immunodeficiency virus [HIV], etc.), or due to medications or treatments (example: therapies used to treat cancer and rheumatological diseases). However, even if you do not have one on these, it can still happen. If you have any of these conditions, or take one of these drugs, make sure to notify your treating physician. Nerve Damage: This is more common when the treatment is an invasive one, but it can also happen with the use of medications, such as those used in the treatment of cancer. The damage can occur to small secondary nerves, or to large primary ones, such as those in the spinal cord and brain. This damage may be temporary or permanent and it may lead to impairments that can range from temporary numbness to permanent paralysis and/or brain death. Allergic Reactions: Any time a substance or material comes in contact with our body, there is the possibility of an allergic reaction. These can range from a mild skin rash (contact dermatitis) to a severe systemic reaction (anaphylactic reaction), which can result in death. Death: In general, any medical intervention can result in death, most of the time due to an unforeseen complication. ____________________________________________________________________________________________  

## 2022-05-09 ENCOUNTER — Ambulatory Visit
Admission: RE | Admit: 2022-05-09 | Discharge: 2022-05-09 | Disposition: A | Discharge status: Home / Self Care | Payer: Medicaid Other | Source: Ambulatory Visit | Attending: Pain Medicine | Admitting: Pain Medicine

## 2022-05-09 ENCOUNTER — Ambulatory Visit: Payer: Medicaid Other | Attending: Pain Medicine | Admitting: Pain Medicine

## 2022-05-09 ENCOUNTER — Encounter: Payer: Self-pay | Admitting: Pain Medicine

## 2022-05-09 VITALS — BP 125/67 | HR 83 | Temp 97.1°F | Resp 12 | Ht 65.0 in | Wt 228.0 lb

## 2022-05-09 DIAGNOSIS — M25552 Pain in left hip: Secondary | ICD-10-CM

## 2022-05-09 DIAGNOSIS — M533 Sacrococcygeal disorders, not elsewhere classified: Secondary | ICD-10-CM | POA: Diagnosis not present

## 2022-05-09 DIAGNOSIS — S73191S Other sprain of right hip, sequela: Secondary | ICD-10-CM

## 2022-05-09 DIAGNOSIS — M9904 Segmental and somatic dysfunction of sacral region: Secondary | ICD-10-CM | POA: Diagnosis not present

## 2022-05-09 DIAGNOSIS — G8929 Other chronic pain: Secondary | ICD-10-CM

## 2022-05-09 DIAGNOSIS — M7072 Other bursitis of hip, left hip: Secondary | ICD-10-CM | POA: Insufficient documentation

## 2022-05-09 DIAGNOSIS — S73192S Other sprain of left hip, sequela: Secondary | ICD-10-CM

## 2022-05-09 DIAGNOSIS — M7061 Trochanteric bursitis, right hip: Secondary | ICD-10-CM

## 2022-05-09 DIAGNOSIS — M7071 Other bursitis of hip, right hip: Secondary | ICD-10-CM | POA: Insufficient documentation

## 2022-05-09 DIAGNOSIS — M25551 Pain in right hip: Secondary | ICD-10-CM | POA: Diagnosis not present

## 2022-05-09 DIAGNOSIS — M160 Bilateral primary osteoarthritis of hip: Secondary | ICD-10-CM | POA: Diagnosis not present

## 2022-05-09 DIAGNOSIS — M545 Low back pain, unspecified: Secondary | ICD-10-CM | POA: Diagnosis not present

## 2022-05-09 MED ORDER — FENTANYL CITRATE (PF) 100 MCG/2ML IJ SOLN
INTRAMUSCULAR | Status: AC
  Filled 2022-05-09: qty 2

## 2022-05-09 MED ORDER — ROPIVACAINE HCL 2 MG/ML IJ SOLN
18.0000 mL | Freq: Once | INTRAMUSCULAR | Status: AC
  Administered 2022-05-09: 18 mL via INTRA_ARTICULAR

## 2022-05-09 MED ORDER — FENTANYL CITRATE (PF) 100 MCG/2ML IJ SOLN
25.0000 ug | INTRAMUSCULAR | Status: DC | PRN
  Administered 2022-05-09: 50 ug via INTRAVENOUS

## 2022-05-09 MED ORDER — METHYLPREDNISOLONE ACETATE 80 MG/ML IJ SUSP
INTRAMUSCULAR | Status: AC
  Filled 2022-05-09: qty 1

## 2022-05-09 MED ORDER — LIDOCAINE HCL 2 % IJ SOLN
20.0000 mL | Freq: Once | INTRAMUSCULAR | Status: AC
  Administered 2022-05-09: 100 mg

## 2022-05-09 MED ORDER — PENTAFLUOROPROP-TETRAFLUOROETH EX AERO
INHALATION_SPRAY | Freq: Once | CUTANEOUS | Status: AC
  Administered 2022-05-09: 30 via TOPICAL
  Filled 2022-05-09: qty 116

## 2022-05-09 MED ORDER — LIDOCAINE HCL (PF) 2 % IJ SOLN
INTRAMUSCULAR | Status: AC
  Filled 2022-05-09: qty 5

## 2022-05-09 MED ORDER — MIDAZOLAM HCL 5 MG/5ML IJ SOLN
INTRAMUSCULAR | Status: AC
  Filled 2022-05-09: qty 5

## 2022-05-09 MED ORDER — ROPIVACAINE HCL 2 MG/ML IJ SOLN
4.0000 mL | Freq: Once | INTRAMUSCULAR | Status: AC
  Administered 2022-05-09: 4 mL via INTRA_ARTICULAR

## 2022-05-09 MED ORDER — METHYLPREDNISOLONE ACETATE 40 MG/ML IJ SUSP
40.0000 mg | Freq: Once | INTRAMUSCULAR | Status: AC
  Administered 2022-05-09: 40 mg via INTRA_ARTICULAR

## 2022-05-09 MED ORDER — LIDOCAINE HCL (PF) 2 % IJ SOLN
INTRAMUSCULAR | Status: AC
  Filled 2022-05-09: qty 15

## 2022-05-09 MED ORDER — LACTATED RINGERS IV SOLN: Freq: Once | INTRAVENOUS | Status: AC

## 2022-05-09 MED ORDER — MIDAZOLAM HCL 5 MG/5ML IJ SOLN
0.5000 mg | Freq: Once | INTRAMUSCULAR | Status: AC
  Administered 2022-05-09: 2 mg via INTRAVENOUS

## 2022-05-09 MED ORDER — METHYLPREDNISOLONE ACETATE 80 MG/ML IJ SUSP
80.0000 mg | Freq: Once | INTRAMUSCULAR | Status: AC
  Administered 2022-05-09: 80 mg via INTRA_ARTICULAR

## 2022-05-09 NOTE — Patient Instructions (Signed)
____________________________________________________________________________________________  Patient Information update  To: All of our patients.  Re: Name change.  It has been made official that our current name, "[facility]"   will soon be changed to "[facility]".   The purpose of this change is to eliminate any confusion created by the concept of our practice being a "[facility]ication [facility]". In the past this has led to the misconception that we treat pain primarily by the use of prescription medications.  Nothing can be farther from the truth.   Understanding PAIN MANAGEMENT: To further understand what our practice does, you first have to understand that "Pain Management" is a subspecialty that requires additional training once a physician has completed their specialty training, which can be in either Anesthesia, Neurology, Psychiatry, or Physical Medicine and Rehabilitation (PMR). Each one of these contributes to the final approach taken by each physician to the management of their patient's pain. To be a "Pain Management Specialist" you must have first completed one of the specialty trainings below.  Anesthesiologists - trained in clinical pharmacology and interventional techniques such as nerve blockade and regional as well as central neuroanatomy. They are trained to block pain before, during, and after surgical interventions.  Neurologists - trained in the diagnosis and pharmacological treatment of complex neurological conditions, such as Multiple Sclerosis, Parkinson's, spinal cord injuries, and other systemic conditions that may be associated with symptoms that may include but are not limited to pain. They tend to rely primarily on the treatment of chronic pain using prescription medications.  Psychiatrist - trained in conditions affecting the psychosocial wellbeing  of patients including but not limited to depression, anxiety, schizophrenia, personality disorders, addiction, and other substance use disorders that may be associated with chronic pain. They tend to rely primarily on the treatment of chronic pain using prescription medications.   Physical Medicine and Rehabilitation (PMR) physicians, also known as physiatrists - trained to treat a wide variety of medical conditions affecting the brain, spinal cord, nerves, bones, joints, ligaments, muscles, and tendons. Their training is primarily aimed at treating patients that have suffered injuries that have caused severe physical impairment. Their training is primarily aimed at the physical therapy and rehabilitation of those patients. They may also work alongside orthopedic surgeons or neurosurgeons using their expertise in assisting surgical patients to recover after their surgeries.  INTERVENTIONAL PAIN MANAGEMENT is sub-subspecialty of Pain Management.  Our physicians are Board-certified in Anesthesia, Pain Management, and Interventional Pain Management.  This meaning that not only have they been trained and Board-certified in their specialty of Anesthesia, and subspecialty of Pain Management, but they have also received further training in the sub-subspecialty of Interventional Pain Management, in order to become Board-certified as INTERVENTIONAL PAIN MANAGEMENT SPECIALIST.    Mission: Our goal is to use our skills in  Ray as alternatives to the chronic use of prescription opioid medications for the treatment of pain. To make this more clear, we have changed our name to reflect what we do and offer. We will continue to offer medication management assessment and recommendations, but we will not be taking over any patient's medication management.  ____________________________________________________________________________________________   ____________________________________________________________________________________________  Post-Procedure Discharge Instructions  Instructions: Apply ice:  Purpose: This will minimize any swelling and discomfort after procedure.  When: Day of procedure, as soon as you get home. How: Fill a plastic sandwich bag with crushed ice. Cover it with a small towel and  apply to injection site. How long: (15 min on, 15 min off) Apply for 15 minutes then remove x 15 minutes.  Repeat sequence on day of procedure, until you go to bed. Apply heat:  Purpose: To treat any soreness and discomfort from the procedure. When: Starting the next day after the procedure. How: Apply heat to procedure site starting the day following the procedure. How long: May continue to repeat daily, until discomfort goes away. Food intake: Start with clear liquids (like water) and advance to regular food, as tolerated.  Physical activities: Keep activities to a minimum for the first 8 hours after the procedure. After that, then as tolerated. Driving: If you have received any sedation, be responsible and do not drive. You are not allowed to drive for 24 hours after having sedation. Blood thinner: (Applies only to those taking blood thinners) You may restart your blood thinner 6 hours after your procedure. Insulin: (Applies only to Diabetic patients taking insulin) As soon as you can eat, you may resume your normal dosing schedule. Infection prevention: Keep procedure site clean and dry. Shower daily and clean area with soap and water. Post-procedure Pain Diary: Extremely important that this be done correctly and accurately. Recorded information will be used to determine the next step in treatment. For the purpose of accuracy, follow these rules: Evaluate only the area treated. Do not report or include pain from an untreated area. For the purpose of this evaluation, ignore all other areas of pain, except for the treated area. After  your procedure, avoid taking a long nap and attempting to complete the pain diary after you wake up. Instead, set your alarm clock to go off every hour, on the hour, for the initial 8 hours after the procedure. Document the duration of the numbing medicine, and the relief you are getting from it. Do not go to sleep and attempt to complete it later. It will not be accurate. If you received sedation, it is likely that you were given a medication that may cause amnesia. Because of this, completing the diary at a later time may cause the information to be inaccurate. This information is needed to plan your care. Follow-up appointment: Keep your post-procedure follow-up evaluation appointment after the procedure (usually 2 weeks for most procedures, 6 weeks for radiofrequencies). DO NOT FORGET to bring you pain diary with you.   Expect: (What should I expect to see with my procedure?) From numbing medicine (AKA: Local Anesthetics): Numbness or decrease in pain. You may also experience some weakness, which if present, could last for the duration of the local anesthetic. Onset: Full effect within 15 minutes of injected. Duration: It will depend on the type of local anesthetic used. On the average, 1 to 8 hours.  From steroids (Applies only if steroids were used): Decrease in swelling or inflammation. Once inflammation is improved, relief of the pain will follow. Onset of benefits: Depends on the amount of swelling present. The more swelling, the longer it will take for the benefits to be seen. In some cases, up to 10 days. Duration: Steroids will stay in the system x 2 weeks. Duration of benefits will depend on multiple posibilities including persistent irritating factors. Side-effects: If present, they may typically last 2 weeks (the duration of the steroids). Frequent: Cramps (if they occur, drink Gatorade and take over-the-counter Magnesium 450-500 mg once to twice a day); water retention with temporary  weight gain; increases in blood sugar; decreased immune system response; increased appetite. Occasional: Facial  flushing (red, warm cheeks); mood swings; menstrual changes. Uncommon: Long-term decrease or suppression of natural hormones; bone thinning. (These are more common with higher doses or more frequent use. This is why we prefer that our patients avoid having any injection therapies in other practices.)  Very Rare: Severe mood changes; psychosis; aseptic necrosis. From procedure: Some discomfort is to be expected once the numbing medicine wears off. This should be minimal if ice and heat are applied as instructed.  Call if: (When should I call?) You experience numbness and weakness that gets worse with time, as opposed to wearing off. New onset bowel or bladder incontinence. (Applies only to procedures done in the spine)  Emergency Numbers: Durning business hours (Monday - Thursday, 8:00 AM - 4:00 PM) (Friday, 9:00 AM - 12:00 Noon): [phone] After hours: [phone] NOTE: If you are having a problem and are unable connect with, or to talk to a provider, then go to your nearest urgent care or emergency department. If the problem is serious and urgent, please call 911. ____________________________________________________________________________________________

## 2022-05-09 NOTE — Progress Notes (Addendum)
PROVIDER NOTE: Interpretation of information contained herein should be left to medically-trained personnel. Specific patient instructions are provided elsewhere under "Patient Instructions" section of medical record. This document was created in part using STT-dictation technology, any transcriptional errors that may result from this process are unintentional.  Patient: Danielle [name] Type: Established DOB: 1961/05/01 MRN: [id number] PCP: Danielle Orleans, MD  Service: Procedure DOS: 05/09/2022 Setting: Ambulatory Location: Ambulatory outpatient facility Delivery: Face-to-face Provider: Gaspar Cola, MD Specialty: Interventional Pain Management Specialty designation: 09 Location: Outpatient facility Ref. Prov.: Danielle Pointer, MD    Primary Reason for Visit: Interventional Pain Management Treatment. CC: Hip Pain  Procedure #1:       Type:  Intra-articular hip  Hip:R5L6  TB:R3L4   Laterality: Bilateral (-50)  Level: Lower pelvic and hip joint level.  Imaging: Fluoroscopy-guided         Anesthesia: Local anesthesia (1-2% Lidocaine) Anxiolysis: IV Versed 2.0 mg Sedation: Moderate Sedation Fentanyl 1 mL (50 mcg) DOS: 05/09/2022  Performed by: Danielle Cola, MD  Purpose: Therapeutic Indications: Hip pain severe enough to impact quality of life or function. Rationale (medical necessity): procedure needed and proper for the diagnosis and/or treatment of Danielle [name]'s medical symptoms and needs. 1. Chronic hip pain (2ry area of Pain) (Bilateral) (R>L)   2. Acetabular labrum tear, sequela (Left)   3. Acetabular labrum tear, sequela (Right)   4. 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)   5. Greater trochanteric bursitis of hips (Bilateral)    Procedure #2:              Type: Sacroiliac Joint Steroid Injection #2    Laterality: Right     Level: PSIS (Posterior Superior Iliac Spine)  Imaging: Fluoroscopic guidance Anesthesia: Local anesthesia (1-2%  Lidocaine)  Purpose: Therapeutic Indications: Sacroiliac joint pain in the lower back and hip area severe enough to impact quality of life or function. Rationale (medical necessity): procedure needed and proper for the diagnosis and/or treatment of Danielle [name]'s medical symptoms and needs. 1. Chronic sacroiliac joint pain (Right)   2. Sacroiliac joint dysfunction (Right)   3. Somatic dysfunction of sacroiliac joint (Right)    Target: Interarticular sacroiliac joint. Location: Medial to the postero-medial edge of iliac spine. Region: Lumbosacral-sacrococcygeal. Approach: Inferior postero-medial percutaneous approach. Type of procedure: Percutaneous joint injection.  NAS-11 Pain score:   Pre-procedure: 5 /10   Post-procedure: 2 /10       Position / Prep / Materials for procedure #1:  Position: Prone  Prep solution: DuraPrep (Iodine Povacrylex [0.7% available iodine] and Isopropyl Alcohol, 74% w/w) Prep Area:  Entire Posterolateral hip area. Materials:  Tray: Block tray Needle(s):  Type: Spinal  Gauge (G): 22  Length: 7-in  Qty: 2  Position / Prep / Materials for procedure #2:  Position: Prone  Prep solution: DuraPrep (Iodine Povacrylex [0.7% available iodine] and Isopropyl Alcohol, 74% w/w) Prep Area: Entire posterior lumbosacral area  Materials:  Tray: Block Needle(s):  Type: Spinal  Gauge (G): 22  Length: 5-in Qty: 1  Pre-op H&P Assessment:  Danielle [name] is a 61 y.o. (year old), female patient, seen today for interventional treatment. She  has a past surgical history that includes necksurgery (10/21/2014); Back surgery (2013); Foot surgery (Right); Elbow surgery (Left); Carpal tunnel release (Right); Nose surgery; Partial hysterectomy; Total shoulder replacement (Bilateral); and Colonoscopy with propofol (N/A, 02/05/2017). Danielle [name] has a current medication list which includes the following prescription(s): albuterol, azelastine hcl, cetirizine, cholecalciferol,  citalopram, cyclobenzaprine, docusate sodium, levothyroxine, magnesium hydroxide, naloxone,  nystatin, and diclofenac sodium, and the following Facility-Administered Medications: fentanyl and lactated ringers. Her primarily concern today is the Hip Pain  Initial Vital Signs:  Pulse/HCG Rate: 83ECG Heart Rate: 73 (NSR) Temp: 98 F (36.7 C) Resp: 18 BP: 132/84 SpO2: 100 %  BMI: Estimated body mass index is 37.94 kg/m as calculated from the following:   Height as of this encounter: '5\' 5"'$  (1.651 m).   Weight as of this encounter: 228 lb (103.4 kg).  Risk Assessment: Allergies: Reviewed. She is allergic to aripiprazole, duloxetine, gabapentin, gold, iodinated contrast media, naproxen sodium, nortriptyline hcl, nsaids, pregabalin, meperidine, ace inhibitors, aspirin, cefpodoxime, fluoxetine, fluticasone-salmeterol, levofloxacin, lithium, paroxetine, tape, telithromycin, theophylline, topiramate, trazodone, triamcinolone, venlafaxine, bupropion, ketorolac tromethamine, morphine, and moxifloxacin.  Allergy Precautions: None required Coagulopathies: Reviewed. None identified.  Blood-thinner therapy: None at this time Active Infection(s): Reviewed. None identified. Danielle [name] is afebrile  Site Confirmation: Danielle [name] was asked to confirm the procedure and laterality before marking the site Procedure checklist: Completed Consent: Before the procedure and under the influence of no sedative(s), amnesic(s), or anxiolytics, the patient was informed of the treatment options, risks and possible complications. To fulfill our ethical and legal obligations, as recommended by the American Medical Association's Code of Ethics, I have informed the patient of my clinical impression; the nature and purpose of the treatment or procedure; the risks, benefits, and possible complications of the intervention; the alternatives, including doing nothing; the risk(s) and benefit(s) of the alternative treatment(s) or  procedure(s); and the risk(s) and benefit(s) of doing nothing. The patient was provided information about the general risks and possible complications associated with the procedure. These may include, but are not limited to: failure to achieve desired goals, infection, bleeding, organ or nerve damage, allergic reactions, paralysis, and death. In addition, the patient was informed of those risks and complications associated to the procedure, such as failure to decrease pain; infection; bleeding; organ or nerve damage with subsequent damage to sensory, motor, and/or autonomic systems, resulting in permanent pain, numbness, and/or weakness of one or several areas of the body; allergic reactions; (i.e.: anaphylactic reaction); and/or death. Furthermore, the patient was informed of those risks and complications associated with the medications. These include, but are not limited to: allergic reactions (i.e.: anaphylactic or anaphylactoid reaction(s)); adrenal axis suppression; blood sugar elevation that in diabetics may result in ketoacidosis or comma; water retention that in patients with history of congestive heart failure may result in shortness of breath, pulmonary edema, and decompensation with resultant heart failure; weight gain; swelling or edema; medication-induced neural toxicity; particulate matter embolism and blood vessel occlusion with resultant organ, and/or nervous system infarction; and/or aseptic necrosis of one or more joints. Finally, the patient was informed that Medicine is not an exact science; therefore, there is also the possibility of unforeseen or unpredictable risks and/or possible complications that may result in a catastrophic outcome. The patient indicated having understood very clearly. We have given the patient no guarantees and we have made no promises. Enough time was given to the patient to ask questions, all of which were answered to the patient's satisfaction. Ms. Bergin has  indicated that she wanted to continue with the procedure. Attestation: I, the ordering provider, attest that I have discussed with the patient the benefits, risks, side-effects, alternatives, likelihood of achieving goals, and potential problems during recovery for the procedure that I have provided informed consent. Date  Time: 05/09/2022  9:01 AM  Pre-Procedure Preparation:  Monitoring: As per clinic protocol. Respiration, ETCO2,  SpO2, BP, heart rate and rhythm monitor placed and checked for adequate function Safety Precautions: Patient was assessed for positional comfort and pressure points before starting the procedure. Time-out: I initiated and conducted the "Time-out" before starting the procedure, as per protocol. The patient was asked to participate by confirming the accuracy of the "Time Out" information. Verification of the correct person, site, and procedure were performed and confirmed by me, the nursing staff, and the patient. "Time-out" conducted as per Joint Commission's Universal Protocol (UP.01.01.01). Time: 1015  Description/Narrative of Procedure #1:  Rationale (medical necessity): procedure needed and proper for the diagnosis and/or treatment of the patient's medical symptoms and needs. Procedural Technique Safety Precautions: Aspiration looking for blood return was conducted prior to all injections. At no point did we inject any substances, as a needle was being advanced. No attempts were made at seeking any paresthesias. Safe injection practices and needle disposal techniques used. Medications properly checked for expiration dates. SDV (single dose vial) medications used. Description of the Procedure: Protocol guidelines were followed. The patient was assisted into a comfortable position. The target area was identified and the area prepped in the usual manner. Skin & deeper tissues infiltrated with local anesthetic. Appropriate amount of time allowed to pass for local anesthetics  to take effect. The procedure needles were then advanced to the target area. Proper needle placement secured. Negative aspiration confirmed. Solution injected in intermittent fashion, asking for systemic symptoms every 0.5cc of injectate. The needles were then removed and the area cleansed, making sure to leave some of the prepping solution back to take advantage of its long term bactericidal properties.  Start Time: 1015 hrs.  Imaging Guidance (Non-Spinal) for procedure #1:  Type of Imaging Technique: Fluoroscopy Guidance (Non-Spinal) Indication(s): Assistance in needle guidance and placement for procedures requiring needle placement in or near specific anatomical locations not easily accessible without such assistance. Exposure Time: Please see nurses notes. Contrast: Before injecting any contrast, we confirmed that the patient did not have an allergy to iodine, shellfish, or radiological contrast. Once satisfactory needle placement was completed at the desired level, radiological contrast was injected. Contrast injected under live fluoroscopy. No contrast complications. See chart for type and volume of contrast used. Fluoroscopic Guidance: I was personally present during the use of fluoroscopy. "Tunnel Vision Technique" used to obtain the best possible view of the target area. Parallax error corrected before commencing the procedure. "Direction-depth-direction" technique used to introduce the needle under continuous pulsed fluoroscopy. Once target was reached, antero-posterior, oblique, and lateral fluoroscopic projection used confirm needle placement in all planes. Images permanently stored in EMR. Interpretation: I personally interpreted the imaging intraoperatively. Adequate needle placement confirmed in multiple planes. Appropriate spread of contrast into desired area was observed. No evidence of afferent or efferent intravascular uptake. Permanent images saved into the patient's  record.  Description/Narrative of Procedure #2:          Rationale (medical necessity): procedure needed and proper for the diagnosis and/or treatment of the patient's medical symptoms and needs. Procedural Technique Safety Precautions: Aspiration looking for blood return was conducted prior to all injections. At no point did we inject any substances, as a needle was being advanced. No attempts were made at seeking any paresthesias. Safe injection practices and needle disposal techniques used. Medications properly checked for expiration dates. SDV (single dose vial) medications used. Description of the Procedure: Protocol guidelines were followed. The patient was assisted into a comfortable position. The target area was identified and the area prepped  in the usual manner. Skin & deeper tissues infiltrated with local anesthetic. Appropriate amount of time allowed to pass for local anesthetics to take effect. The procedure needles were then advanced to the target area. Proper needle placement secured. Negative aspiration confirmed. Solution injected in intermittent fashion, asking for systemic symptoms every 0.5cc of injectate. The needles were then removed and the area cleansed, making sure to leave some of the prepping solution back to take advantage of its long term bactericidal properties.  Vitals:   05/09/22 1025 05/09/22 1027 05/09/22 1036 05/09/22 1044  BP: 112/69 109/67 117/60 (!) 127/115  Pulse:      Resp: '18 16 14 14  '$ Temp:   98.1 F (36.7 C)   TempSrc:   Temporal   SpO2: 100% 100% 98% 99%  Weight:      Height:        End Time: 1027 hrs.  Imaging Guidance (Non-Spinal) for procedure #2:  Type of Imaging Technique: Fluoroscopy Guidance (Non-Spinal) Indication(s): Assistance in needle guidance and placement for procedures requiring needle placement in or near specific anatomical locations not easily accessible without such assistance. Exposure Time: Please see nurses notes. Contrast:  None used. Fluoroscopic Guidance: I was personally present during the use of fluoroscopy. "Tunnel Vision Technique" used to obtain the best possible view of the target area. Parallax error corrected before commencing the procedure. "Direction-depth-direction" technique used to introduce the needle under continuous pulsed fluoroscopy. Once target was reached, antero-posterior, oblique, and lateral fluoroscopic projection used confirm needle placement in all planes. Images permanently stored in EMR. Interpretation: No contrast injected. I personally interpreted the imaging intraoperatively. Adequate needle placement confirmed in multiple planes. Permanent images saved into the patient's record.  Post-operative Assessment:  Post-procedure Vital Signs:  Pulse/HCG Rate: 8374 Temp: 98.1 F (36.7 C) Resp: 14 BP: (!) 127/115 SpO2: 99 %  EBL: None  Complications: No immediate post-treatment complications observed by team, or reported by patient.  Note: The patient tolerated the entire procedure well. A repeat set of vitals were taken after the procedure and the patient was kept under observation following institutional policy, for this type of procedure. Post-procedural neurological assessment was performed, showing return to baseline, prior to discharge. The patient was provided with post-procedure discharge instructions, including a section on how to identify potential problems. Should any problems arise concerning this procedure, the patient was given instructions to immediately contact us, at any time, without hesitation. In any case, we plan to contact the patient by telephone for a follow-up status report regarding this interventional procedure.  Comments:  No additional relevant information.  Plan of Care  Orders:  Orders Placed This Encounter  Procedures   HIP INJECTION    Scheduling Instructions:     Side: Bilateral     Sedation: Patient's choice.     Timeframe: Today   SACROILIAC JOINT  INJECTION    Scheduling Instructions:     Side: Right-sided     Sedation: Patient's choice.     Timeframe: Today    Order Specific Question:   Where will this procedure be performed?    Answer:   [facility]   DG [facility] C-ARM 1-60 MIN NO REPORT    Intraoperative interpretation by procedural physician at [place].    Standing Status:   Standing    Number of Occurrences:   1    Order Specific Question:   Reason for exam:    Answer:   Assistance in needle guidance and placement for  procedures requiring needle placement in or near specific anatomical locations not easily accessible without such assistance.   Informed Consent Details: Physician/Practitioner Attestation; Transcribe to consent form and obtain patient signature    Nursing Order: Transcribe to consent form and obtain patient signature. Note: Always confirm laterality of pain with Ms. Domingo Cocking, before procedure.    Order Specific Question:   Physician/Practitioner attestation of informed consent for procedure/surgical case    Answer:   I, the physician/practitioner, attest that I have discussed with the patient the benefits, risks, side effects, alternatives, likelihood of achieving goals and potential problems during recovery for the procedure that I have provided informed consent.    Order Specific Question:   Procedure    Answer:   Hip injection    Order Specific Question:   Physician/Practitioner performing the procedure    Answer:   Hutson Luft A. [name], MD    Order Specific Question:   Indication/Reason    Answer:   Hip Joint Pain (Arthralgia)   Provide equipment / supplies at bedside    "Block Tray" (Disposable  single use) Needle type: SpinalSpinal Amount/quantity: 2 Size: Long (7-inch) Gauge: 22G    Standing Status:   Standing    Number of Occurrences:   1    Order Specific Question:   Specify    Answer:   Block Tray   Informed Consent Details: Physician/Practitioner Attestation;  Transcribe to consent form and obtain patient signature    Nursing Order: Transcribe to consent form and obtain patient signature. Note: Always confirm laterality of pain with Ms. Domingo Cocking, before procedure.    Order Specific Question:   Physician/Practitioner attestation of informed consent for procedure/surgical case    Answer:   I, the physician/practitioner, attest that I have discussed with the patient the benefits, risks, side effects, alternatives, likelihood of achieving goals and potential problems during recovery for the procedure that I have provided informed consent.    Order Specific Question:   Procedure    Answer:   Sacroiliac Joint Block    Order Specific Question:   Physician/Practitioner performing the procedure    Answer:   Manda Holstad A. [name], MD    Order Specific Question:   Indication/Reason    Answer:   Chronic Low Back and Hip Pain secondary to Sacroiliac Joint Pain (Arthralgia/Arthropathy)   Provide equipment / supplies at bedside    "Block Tray" (Disposable  single use) Needle type: SpinalSpinal Amount/quantity: 1 Size: Medium (5-inch) Gauge: 22G    Standing Status:   Standing    Number of Occurrences:   1    Order Specific Question:   Specify    Answer:   Block Tray   Chronic Opioid Analgesic:  No chronic opioid analgesics therapy prescribed by our practice.  Discontinued (11/21/2021) UDS. (+) COCAINE.    Medications ordered for procedure: Meds ordered this encounter  Medications   lidocaine (XYLOCAINE) 2 % (with pres) injection 400 mg   pentafluoroprop-tetrafluoroeth (GEBAUERS) aerosol   lactated ringers infusion   midazolam (VERSED) 5 MG/5ML injection 0.5-2 mg    Make sure Flumazenil is available in the pyxis when using this medication. If oversedation occurs, administer 0.2 mg IV over 15 sec. If after 45 sec no response, administer 0.2 mg again over 1 min; may repeat at 1 min intervals; not to exceed 4 doses (1 mg)   fentaNYL (SUBLIMAZE) injection 25-50  mcg    Make sure Narcan is available in the pyxis when using this medication. In the event of respiratory depression (  RR< 8/min): Titrate NARCAN (naloxone) in increments of 0.1 to 0.2 mg IV at 2-3 minute intervals, until desired degree of reversal.   ropivacaine (PF) 2 mg/mL (0.2%) (NAROPIN) injection 18 mL   ropivacaine (PF) 2 mg/mL (0.2%) (NAROPIN) injection 4 mL   methylPREDNISolone acetate (DEPO-MEDROL) injection 40 mg   methylPREDNISolone acetate (DEPO-MEDROL) injection 80 mg   Medications administered: We administered lidocaine, pentafluoroprop-tetrafluoroeth, lactated ringers, midazolam, fentaNYL, ropivacaine (PF) 2 mg/mL (0.2%), ropivacaine (PF) 2 mg/mL (0.2%), methylPREDNISolone acetate, and methylPREDNISolone acetate.  See the medical record for exact dosing, route, and time of administration.  Follow-up plan:   Return in about 2 weeks (around 05/23/2022) for Proc-day (T,Th), (VV), (PPE).       Interventional Therapies  Risk  Complexity Considerations:   Estimated body mass index is 38.27 kg/m as calculated from the following:   Height as of this encounter: '5\' 5"'$  (1.651 m).   Weight as of this encounter: 230 lb (104.3 kg). 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  Allergic to NSAIDs.  Abnormal UDS (01/02/2016). (+) Unreported tramadol.   Planned  Pending:   Therapeutic/palliative bilateral IA hip joint + trochanteric bursa + right sacroiliac joint injection.    Under consideration:   Diagnostic caudal ESI + epidurogram  Diagnostic right CESI  Diagnostic/therapeutic right cervical facet MBB #2 Diagnostic left SI Blk    Completed:   Therapeutic bilateral IA hip + peri-articular bursae inj.  #B2 R4 L5 (01/31/2022) (100/100/85/85)  Palliative left trochanteric bursa x2 (09/18/2021) (100/100/90)  Palliative right trochanteric bursa x1 (01/25/2021) (100/100/90)  Palliative right suprascapular NB x3 (08/08/2020) (50/50/100/90-100)   Palliative left  suprascapular NB x4 (02/22/2021) (100/100/90/90)  Palliative bilateral suprascapular nerve RFA x1 (05/13/2019) (100/100/90/90-100)  Therapeutic right IA hip joint injection x3 (06/01/2020) (100/100/100/90-100)  Therapeutic left IA hip joint injection x4 (09/18/2021) (100/100/90)  Therapeutic bilateral IA hip joint inj. x1 (12/03/2021) (by Dr. Gillis Santa) (100/100/90)  Diagnostic bilateral lumbar facet MBB x3 (09/30/2017) (100/75/60)  Palliative right lumbar facet RFA x2 (12/16/2017) (100/100/85)  Palliative left lumbar facet RFA x2 (02/12/2018) [facility])  Diagnostic right cervical facet MBB x2 (04/11/2022) (100/100/100/100)  Diagnostic left cervical facet MBB x1 (09/30/2016) (90/100/0)  Diagnostic right SI joint block x1 (06/12/2017) (DNKFU -0/0/80)    Therapeutic  Palliative (PRN) options:   Therapeutic trochanteric bursa   Therapeutic suprascapular NB   Therapeutic suprascapular nerve RFA   Therapeutic IA hip joint injection   Therapeutic lumbar facet MBB   Therapeutic lumbar facet RFA   Therapeutic SI Blk       Recent Visits Date Type Provider Dept  04/25/22 Office Visit Danielle Pointer, MD [facility]  04/22/22 Office Visit Danielle Pointer, MD [facility]  04/11/22 Procedure visit Danielle Pointer, MD [facility]  03/27/22 Office Visit Danielle Pointer, MD [facility]  03/06/22 Office Visit Danielle Pointer, MD [facility]  02/14/22 Office Visit Danielle Pointer, MD [facility]  Showing recent visits within past 90 days and meeting all other requirements Today's Visits Date Type Provider Dept  05/09/22 Procedure visit Danielle Pointer, MD [facility]  Showing today's visits and meeting all other requirements Future Appointments Date Type Provider Dept  06/04/22 Appointment Danielle Pointer, MD [facility]  Showing future appointments within next 90 days and meeting all other  requirements  Disposition: Discharge home  Discharge (Date  Time): 05/09/2022;   hrs.   Primary Care Physician: Danielle Orleans, MD Location: [facility] Note by: Beatriz Chancellor  Farrel Conners, MD Date: 05/09/2022; Time: 10:49 AM  Disclaimer:  Medicine is not an Chief Strategy Officer. The only guarantee in medicine is that nothing is guaranteed. It is important to note that the decision to proceed with this intervention was based on the information collected from the patient. The Data and conclusions were drawn from the patient's questionnaire, the interview, and the physical examination. Because the information was provided in large part by the patient, it cannot be guaranteed that it has not been purposely or unconsciously manipulated. Every effort has been made to obtain as much relevant data as possible for this evaluation. It is important to note that the conclusions that lead to this procedure are derived in large part from the available data. Always take into account that the treatment will also be dependent on availability of resources and existing treatment guidelines, considered by other Pain Management Practitioners as being common knowledge and practice, at the time of the intervention. For Medico-Legal purposes, it is also important to point out that variation in procedural techniques and pharmacological choices are the acceptable norm. The indications, contraindications, technique, and results of the above procedure should only be interpreted and judged by a Board-Certified Interventional Pain Specialist with extensive familiarity and expertise in the same exact procedure and technique.

## 2022-05-09 NOTE — Progress Notes (Signed)
Wasted '3mg'$  versed and 53mg fentanyl in stericycle with K Tice RN as witness.

## 2022-05-10 ENCOUNTER — Telehealth: Payer: Self-pay

## 2022-05-10 NOTE — Telephone Encounter (Signed)
Called PP denies any needs at this time. Instructed to call if needed.

## 2022-06-04 ENCOUNTER — Ambulatory Visit: Payer: Medicaid Other | Attending: Pain Medicine | Admitting: Pain Medicine

## 2022-06-04 DIAGNOSIS — Z91199 Patient's noncompliance with other medical treatment and regimen due to unspecified reason: Secondary | ICD-10-CM

## 2022-06-04 NOTE — Progress Notes (Signed)
Unsuccessful attempt to contact patient for Virtual Visit (Pain Management Telehealth)   Patient provided contact information:  [phone] (home); [phone] (mobile); (Preferred) [phone] [email or web address]   Pre-screening:  Our staff was unsuccessful in contacting Danielle [name] using the above provided information.   I unsuccessfully attempted to make contact with Danielle [name] on 06/04/2022 via telephone. I was unable to complete the virtual encounter due to "call not being able to be completed at this time". I was unable to leave a message.  Pharmacotherapy Assessment  Analgesic: No chronic opioid analgesics therapy prescribed by our practice.  Discontinued (11/21/2021) UDS. (+) COCAINE.   Follow-up plan:   Reschedule Visit.    Interventional Therapies  Risk  Complexity Considerations:   Estimated body mass index is 38.27 kg/m as calculated from the following:   Height as of this encounter: '5\' 5"'$  (1.651 m).   Weight as of this encounter: 230 lb (104.3 kg). 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  Allergic to NSAIDs.  Abnormal UDS (01/02/2016). (+) Unreported tramadol.   Planned  Pending:   Therapeutic/palliative bilateral IA hip joint + trochanteric bursa + right sacroiliac joint injection.    Under consideration:   Diagnostic caudal ESI + epidurogram  Diagnostic right CESI  Diagnostic/therapeutic right cervical facet MBB #2 Diagnostic left SI Blk    Completed:   Therapeutic bilateral IA hip + peri-articular bursae inj.  #B2 R4 L5 (01/31/2022) (100/100/85/85)  Palliative left trochanteric bursa x2 (09/18/2021) (100/100/90)  Palliative right trochanteric bursa x1 (01/25/2021) (100/100/90)  Palliative right suprascapular NB x3 (08/08/2020) (50/50/100/90-100)   Palliative left suprascapular NB x4 (02/22/2021) (100/100/90/90)  Palliative bilateral suprascapular nerve RFA x1 (05/13/2019) (100/100/90/90-100)  Therapeutic right IA hip joint  injection x3 (06/01/2020) (100/100/100/90-100)  Therapeutic left IA hip joint injection x4 (09/18/2021) (100/100/90)  Therapeutic bilateral IA hip joint inj. x1 (12/03/2021) (by Dr. Gillis Santa) (100/100/90)  Diagnostic bilateral lumbar facet MBB x3 (09/30/2017) (100/75/60)  Palliative right lumbar facet RFA x2 (12/16/2017) (100/100/85)  Palliative left lumbar facet RFA x2 (02/12/2018) [facility])  Diagnostic right cervical facet MBB x2 (04/11/2022) (100/100/100/100)  Diagnostic left cervical facet MBB x1 (09/30/2016) (90/100/0)  Diagnostic right SI joint block x1 (06/12/2017) (DNKFU -0/0/80)    Therapeutic  Palliative (PRN) options:   Therapeutic trochanteric bursa   Therapeutic suprascapular NB   Therapeutic suprascapular nerve RFA   Therapeutic IA hip joint injection   Therapeutic lumbar facet MBB   Therapeutic lumbar facet RFA   Therapeutic SI Blk        Recent Visits Date Type Provider Dept  05/09/22 Procedure visit Milinda Pointer, MD [facility]  04/25/22 Office Visit Milinda Pointer, MD [facility]  04/22/22 Office Visit Milinda Pointer, MD [facility]  04/11/22 Procedure visit Milinda Pointer, MD [facility]  03/27/22 Office Visit Milinda Pointer, MD [facility]  03/06/22 Office Visit Milinda Pointer, MD [facility]  Showing recent visits within past 90 days and meeting all other requirements Today's Visits Date Type Provider Dept  06/04/22 Office Visit Milinda Pointer, MD [facility]  Showing today's visits and meeting all other requirements Future Appointments No visits were found meeting these conditions. Showing future appointments within next 90 days and meeting all other requirements   Note by: Gaspar Cola, MD Date: 06/04/2022; Time: 4:10 PM

## 2022-08-23 NOTE — Progress Notes (Unsigned)
Patient called and canceled on the same day of the appointment (08/26/2022).

## 2022-08-26 ENCOUNTER — Ambulatory Visit (HOSPITAL_BASED_OUTPATIENT_CLINIC_OR_DEPARTMENT_OTHER): Payer: Medicaid Other | Admitting: Pain Medicine

## 2022-08-26 DIAGNOSIS — Z91199 Patient's noncompliance with other medical treatment and regimen due to unspecified reason: Secondary | ICD-10-CM

## 2022-08-29 ENCOUNTER — Encounter: Payer: Self-pay | Admitting: Pain Medicine

## 2022-08-29 ENCOUNTER — Ambulatory Visit: Payer: Medicaid Other | Attending: Pain Medicine | Admitting: Pain Medicine

## 2022-08-29 VITALS — BP 137/80 | HR 96 | Temp 97.3°F | Resp 18 | Ht 65.0 in | Wt 230.0 lb

## 2022-08-29 DIAGNOSIS — M7061 Trochanteric bursitis, right hip: Secondary | ICD-10-CM | POA: Insufficient documentation

## 2022-08-29 DIAGNOSIS — M4316 Spondylolisthesis, lumbar region: Secondary | ICD-10-CM | POA: Insufficient documentation

## 2022-08-29 DIAGNOSIS — M25551 Pain in right hip: Secondary | ICD-10-CM | POA: Insufficient documentation

## 2022-08-29 DIAGNOSIS — M1611 Unilateral primary osteoarthritis, right hip: Secondary | ICD-10-CM | POA: Insufficient documentation

## 2022-08-29 DIAGNOSIS — R937 Abnormal findings on diagnostic imaging of other parts of musculoskeletal system: Secondary | ICD-10-CM | POA: Insufficient documentation

## 2022-08-29 DIAGNOSIS — M545 Low back pain, unspecified: Secondary | ICD-10-CM | POA: Insufficient documentation

## 2022-08-29 DIAGNOSIS — M47816 Spondylosis without myelopathy or radiculopathy, lumbar region: Secondary | ICD-10-CM | POA: Diagnosis not present

## 2022-08-29 DIAGNOSIS — R935 Abnormal findings on diagnostic imaging of other abdominal regions, including retroperitoneum: Secondary | ICD-10-CM | POA: Insufficient documentation

## 2022-08-29 DIAGNOSIS — G8929 Other chronic pain: Secondary | ICD-10-CM | POA: Insufficient documentation

## 2022-08-29 MED ORDER — KETOROLAC TROMETHAMINE 60 MG/2ML IM SOLN
60.0000 mg | Freq: Once | INTRAMUSCULAR | Status: AC
  Administered 2022-08-29: 60 mg via INTRAMUSCULAR
  Filled 2022-08-29: qty 2

## 2022-08-29 MED ORDER — METHOCARBAMOL 1000 MG/10ML IJ SOLN
200.0000 mg | Freq: Once | INTRAMUSCULAR | Status: AC
  Administered 2022-08-29: 200 mg via INTRAMUSCULAR
  Filled 2022-08-29: qty 10

## 2022-08-29 NOTE — Progress Notes (Signed)
Safety precautions to be maintained throughout the outpatient stay will include: orient to surroundings, keep bed in low position, maintain call bell within reach at all times, provide assistance with transfer out of bed and ambulation.  

## 2022-08-29 NOTE — Progress Notes (Signed)
PROVIDER NOTE: Information contained herein reflects review and annotations entered in association with encounter. Interpretation of such information and data should be left to medically-trained personnel. Information provided to patient can be located elsewhere in the medical record under "Patient Instructions". Document created using STT-dictation technology, any transcriptional errors that may result from process are unintentional.    Patient: Danielle [name]  Service Category: E/M  Provider: Gaspar Cola, MD  DOB: 1961-01-18  DOS: 08/29/2022  Referring Provider: Danae Orleans, MD  MRN: [id number]  Specialty: Interventional Pain Management  PCP: Danae Orleans, MD  Type: Established Patient  Setting: Ambulatory outpatient    Location: Office  Delivery: Face-to-face     HPI  [name], a 62 y.o. year old female, is here today because of her Acute exacerbation of chronic low back pain [M54.50, G89.29]. Danielle [name] primary complain today is Back Pain Last encounter: My last encounter with her was on 08/26/2022. Pertinent problems: Danielle [name] has Cervical spinal cord compression [facility]) (April, 2017); Cervical spinal stenosis; Chronic shoulder pain (1ry area of Pain) (Bilateral) (R>L); Chronic low back pain (3ry area of Pain) (Bilateral) (R>L) w/ sciatica (Right); Lumbar facet syndrome (Bilateral) (R>L); Lumbar spondylosis; Failed back surgical syndrome; Chronic lower extremity pain (Right); Chronic neck pain (Bilateral) (R>L); Chronic cervical radicular pain (Right); Ulnar neuropathy (Left); Hx of cervical spine surgery; Cervical spondylosis; Fibromyalgia; Chronic sacroiliac pain (Bilateral) (L>R); History of total arthroplasty of shoulder (Left); Chronic pain syndrome; Complaints of weakness of lower extremity; Anterolisthesis of lumbar spine (L4/L5); Cervical facet syndrome ([place]); Chronic musculoskeletal pain; Muscle spasm of back; Myofascial pain; Chronic hip pain (2ry area of Pain)  (Bilateral) (R>L); 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); Trigger point with back pain; Osteoarthritis of shoulder (Bilateral); Spondylosis without myelopathy or radiculopathy, lumbosacral region; Cellulitis of leg, right; Chronic trochanteric bursitis (Bilateral); Osteoarthritis involving multiple joints; Chronic shoulder pain after replacement; History of total shoulder replacement (Right); History of total replacement of shoulder joints (Bilateral); Leg edema, left; Spondylosis without myelopathy or radiculopathy, cervical region; Chronic hip pain (Right); Osteoarthritis of hip (Right); Chronic sacroiliac joint pain (Left); Other spondylosis, sacral and sacrococcygeal region; Sacroiliac joint dysfunction (Left); Somatic dysfunction of sacroiliac joint (Left); Enthesopathy of sacroiliac joint (Left); DDD (degenerative disc disease), cervical; DDD (degenerative disc disease), lumbosacral; Other intervertebral disc degeneration, lumbar region; Acetabular labrum tear, sequela (Left); Acetabular labrum tear, sequela (Right); Greater trochanteric bursitis of hips (Bilateral); Epidural fibrosis (Left: L5-S1); Chronic groin pain (Bilateral); Abnormal MRI, lumbar spine (09/28/2020); Abnormal MRI, hip (Bilateral) (10/04/2020); Chronic shoulder pain (Left); Cervicalgia; Chronic neck pain with history of cervical spinal surgery; Chronic hip pain (Left); Chronic groin pain (Left); Abnormal drug screen (11/21/2021); Chronic sacroiliac joint pain (Right); Chronic pain disorder; Arthralgia of hip; Trochanteric bursitis of hip (Right); Left thigh pain; Right buttock pain; Somatic dysfunction of sacroiliac joint (Right); Sacroiliac joint dysfunction (Right); and Chronic low back pain (Bilateral) (R>L) w/o sciatica on their pertinent problem list. Pain Assessment: Severity of Chronic pain is reported as a 4 /10. Location: Back Lower, Mid, Right, Left/Radaites from lower back into groin area into bilateral hips and right thigh.  Onset: More than a month ago. Quality: Constant, Aching. Timing: Constant. Modifying factor(s): Denies. Vitals:  height is 5' 5"$  (1.651 m) and weight is 230 lb (104.3 kg). Her temporal temperature is 97.3 F (36.3 C) (abnormal). Her blood pressure is 137/80 and her pulse is 96. Her respiration is 18 and oxygen saturation is 100%.  BMI: Estimated body mass index is 38.27  kg/m as calculated from the following:   Height as of this encounter: 5' 5"$  (1.651 m).   Weight as of this encounter: 230 lb (104.3 kg).  Reason for encounter: evaluation of worsening, or previously known (established) problem.  The patient comes into our practice today with an acute exacerbation of her low back pain and right hip pain.  She describes having attempted to pick up her brother after he felt sick.  She describes that he weights 300 pounds.  Immediately after she attempted to do that she felt a pop in her neck and her lower back and she began to experience an increase in the pain in the lower back (Bilateral) (R>L) she tried a prednisone pack, which did not help the pain but it did give her insomnia and mood swings.  Physical exam today shows the patient to have exact reproduction of the pain upon performing a lumbar spine hyperextension and rotation maneuver as well as the [employer].  The test was positive bilaterally for ipsilateral facet joint arthralgia with exact reproduction of the patient's current pain.  In addition, the patient also describes having pain in the area of both of her bursa's in the lateral aspect of her upper thighs.  She also describes having pain that seems to run down the lateral aspect of the right leg, stopping just above the knee.  She denies any pain, numbness, or weakness below the knees.  Physical exam today showed exquisite tenderness to palpation over the trochanteric bursa bilaterally, with the right side being worse than the left.  Provocative Patrick maneuver was positive on the right  side for hip joint arthralgia with pain being referred down the leg in the distribution of that she had previously indicated.  In addition, she experiences pain in the right groin area.  The patient does have a longstanding history of hip joint osteoarthritis with abnormal hip joint MRIs done on 10/04/2020.  The results of that MRI indicate that the patient has bilateral hip joint degenerative disease with associated tearing of the labrum.  She also has evidence of bilateral trochanteric bursitis with calcifications in the trochanteric bursa suggestive of chronic bilateral bursitis.  In addition, patient has an MRI of the lumbar spine done on 09/28/2020 that demonstrates progression of a grade 1 anterolisthesis of L4 over L5 with positive surgical changes from prior L4-5 and L5-S1 laminectomies with findings compatible with left L5-S1 subarticular zone peridural fibrosis.  At this point, my impression is that the L4-5 anterolisthesis is significantly putting pressure on the facet joints at the level causing the exacerbation of the pain that we are seeing today.  This is likely to have been aggravated when the patient attempted to lift her brother (and excessive weight).  Based on the patient's description of her acute on chronic pain and a physical exam as well asked the available imaging, I have decided to schedule the patient to come back for a diagnostic/therapeutic bilateral lumbar facet block, in addition to a right-sided intra-articular hip joint injection and a trochanteric bursa injection.  The plan was shared with the patient who understood and accepted.  In addition to that, the patient is having a significant acute component to this pain and therefore today I have offered her an IM injection of Toradol 60 mg + IM Robaxin 200 mg.  The patient has multiple allergies, but she has denied any problems with the Robaxin and in the past we have done Toradol injections with no problems  whatsoever.  Pharmacotherapy Assessment  Analgesic: No chronic opioid analgesics therapy prescribed by our practice.  Discontinued (11/21/2021) UDS. (+) COCAINE.    Monitoring: [place] PMP: PDMP reviewed during this encounter.       Pharmacotherapy: No side-effects or adverse reactions reported. Compliance: No problems identified. Effectiveness: Clinically acceptable.  Al Decant, RN  08/29/2022 12:59 PM  Sign when Signing Visit Safety precautions to be maintained throughout the outpatient stay will include: orient to surroundings, keep bed in low position, maintain call bell within reach at all times, provide assistance with transfer out of bed and ambulation.     No results found for: "CBDTHCR" No results found for: "D8THCCBX" No results found for: "D9THCCBX"  UDS:  Summary  Date Value Ref Range Status  11/21/2021 Note  Final    Comment:    ==================================================================== [employer] [facility] (MW) ==================================================================== Test                             Result       Flag       Units  Drug Present and Declared for Prescription Verification   Oxycodone                      >2433        EXPECTED   ng/mg creat   Oxymorphone                    164          EXPECTED   ng/mg creat   Noroxycodone                   >2433        EXPECTED   ng/mg creat   Noroxymorphone                 58           EXPECTED   ng/mg creat    Sources of oxycodone are scheduled prescription medications.    Oxymorphone, noroxycodone, and noroxymorphone are expected    metabolites of oxycodone. Oxymorphone is also available as a    scheduled prescription medication.  Drug Present not Declared for Prescription Verification   Methamphetamine                12           UNEXPECTED ng/mg creat    Sources of methamphetamine include illicit sources, as a scheduled    prescription medication, as a metabolite of some prescription drugs,     or use of an l-methamphetamine inhaler.    Oxazepam                       17           UNEXPECTED ng/mg creat    Oxazepam may be administered as a scheduled prescription medication;    it is also an expected metabolite of other benzodiazepine drugs,    including diazepam, chlordiazepoxide, prazepam, clorazepate,    halazepam, and temazepam.    Benzoylecgonine                38           UNEXPECTED ng/mg creat    Benzoylecgonine is a metabolite of cocaine; its presence indicates    use of this drug.  Source is most commonly illicit, but cocaine is    present in some topical anesthetic  solutions.  ==================================================================== Test                      Result    Flag   Units      Ref Range   Creatinine              411              mg/dL      >=20 ==================================================================== Declared Medications:  The flagging and interpretation on this report are based on the  following declared medications.  Unexpected results may arise from  inaccuracies in the declared medications.   **Note: The testing scope of this panel includes these medications:   Oxycodone   **Note: The testing scope of this panel does not include the  following reported medications:   Albuterol  Azelastine  Cetirizine  Citalopram (Celexa)  Diclofenac (Voltaren)  Docusate (Colace)  Fluticasone (Trelegy)  Guaifenesin (Mucinex)  Levothyroxine (Synthroid)  Naloxone  Nystatin  Supplement  Tizanidine (Zanaflex)  Umeclidinium (Trelegy)  Vilanterol (Trelegy)  Vitamin D ==================================================================== For clinical consultation, please call [phone]. ====================================================================       ROS  Constitutional: Denies any fever or chills Gastrointestinal: No reported hemesis, hematochezia, vomiting, or acute GI distress Musculoskeletal: Denies any acute  onset joint swelling, redness, loss of ROM, or weakness Neurological: No reported episodes of acute onset apraxia, aphasia, dysarthria, agnosia, amnesia, paralysis, loss of coordination, or loss of consciousness  Medication Review  Azelastine HCl, albuterol, budesonide-formoterol, cetirizine, cholecalciferol, citalopram, cyclobenzaprine, diclofenac Sodium, docusate sodium, doxycycline, levothyroxine, magnesium hydroxide, nystatin, predniSONE, and tiotropium  History Review  Allergy: Danielle [name] is allergic to aripiprazole, duloxetine, gabapentin, gold, iodinated contrast media, naproxen sodium, nortriptyline hcl, nsaids, pregabalin, meperidine, ace inhibitors, aspirin, cefpodoxime, fluoxetine, fluticasone-salmeterol, levofloxacin, lithium, paroxetine, tape, telithromycin, theophylline, topiramate, trazodone, triamcinolone, venlafaxine, bupropion, ketorolac tromethamine, morphine, and moxifloxacin. Drug: Danielle [name]  reports no history of drug use. Alcohol:  reports no history of alcohol use. Tobacco:  reports that she has been smoking cigarettes. She has a 80.00 pack-year smoking history. She has never used smokeless tobacco. Social: Danielle [name]  reports that she has been smoking cigarettes. She has a 80.00 pack-year smoking history. She has never used smokeless tobacco. She reports that she does not drink alcohol and does not use drugs. Medical:  has a past medical history of Acute postoperative pain (01/13/2017), Anxiety, Arthritis, Asthma, Bell's palsy, Bursitis, COPD (chronic obstructive pulmonary disease) ([place]), Depression, Fibromyalgia, Heart murmur, Hepatitis C, Hiatal hernia, Hyperlipidemia, Hypertension, IBS (irritable bowel syndrome), Insomnia, and Thyroid disease. Surgical: Danielle [name]  has a past surgical history that includes necksurgery (10/21/2014); Back surgery (2013); Foot surgery (Right); Elbow surgery (Left); Carpal tunnel release (Right); Nose surgery; Partial hysterectomy; Total  shoulder replacement (Bilateral); and Colonoscopy with propofol (N/A, 02/05/2017). Family: family history includes Cancer in her mother; Gout in her mother; Heart disease in her father.  Laboratory Chemistry Profile   Renal Lab Results  Component Value Date   BUN 14 04/28/2019   CREATININE 0.98 04/28/2019   BCR 13 02/16/2019   GFRAA >60 04/28/2019   GFRNONAA >60 04/28/2019    Hepatic Lab Results  Component Value Date   AST 18 04/28/2019   ALT 16 04/28/2019   ALBUMIN 3.7 04/28/2019   ALKPHOS 80 04/28/2019    Electrolytes Lab Results  Component Value Date   NA 139 04/28/2019   K 4.1 04/28/2019   CL 105 04/28/2019   CALCIUM 9.3 04/28/2019   MG 2.1 02/16/2019  Bone Lab Results  Component Value Date   25OHVITD1 27 (L) 02/16/2019   25OHVITD2 2.0 02/16/2019   25OHVITD3 25 02/16/2019    Inflammation (CRP: Acute Phase) (ESR: Chronic Phase) Lab Results  Component Value Date   CRP 2 02/16/2019   ESRSEDRATE 23 02/16/2019         Note: Above Lab results reviewed.  Recent Imaging Review  DG PAIN CLINIC C-ARM 1-60 MIN NO REPORT Fluoro was used, but no Radiologist interpretation will be provided.  Please refer to "NOTES" tab for provider progress note. Note: Reviewed        Physical Exam  General appearance: Well nourished, well developed, and well hydrated. In no apparent acute distress Mental status: Alert, oriented x 3 (person, place, & time)       Respiratory: No evidence of acute respiratory distress Eyes: PERLA Vitals: BP 137/80   Pulse 96   Temp (!) 97.3 F (36.3 C) (Temporal)   Resp 18   Ht 5' 5"$  (1.651 m)   Wt 230 lb (104.3 kg)   SpO2 100%   BMI 38.27 kg/m  BMI: Estimated body mass index is 38.27 kg/m as calculated from the following:   Height as of this encounter: 5' 5"$  (1.651 m).   Weight as of this encounter: 230 lb (104.3 kg). Ideal: Ideal body weight: 57 kg (125 lb 10.6 oz) Adjusted ideal body weight: 75.9 kg (167 lb 6.4 oz)  Assessment    Diagnosis Status  1. Acute exacerbation of chronic low back pain   2. 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 Chronic low back pain (Bilateral) (R>L) w/o sciatica   4. Anterolisthesis of lumbar spine (L4/L5)   5. Chronic hip pain (Right)   6. Osteoarthritis of hip (Right)   7. Trochanteric bursitis of hip (Right)   8. Abnormal MRI, hip (Bilateral) (10/04/2020)   9. Abnormal MRI, lumbar spine (09/28/2020)    Acute Flareup Worsened Worsened   Updated Problems: Problem  Chronic low back pain (Bilateral) (R>L) w/o sciatica    Plan of Care  Problem-specific:  No problem-specific Assessment & Plan notes found for this encounter.  Danielle [name] has a current medication list which includes the following long-term medication(s): albuterol, azelastine hcl, citalopram, levothyroxine, symbicort, and diclofenac sodium.  Pharmacotherapy (Medications Ordered): Meds ordered this encounter  Medications   ketorolac (TORADOL) injection 60 mg   methocarbamol (ROBAXIN) injection 200 mg   Orders:  Orders Placed This Encounter  Procedures   HIP INJECTION    Standing Status:   Future    Standing Expiration Date:   11/27/2022    Scheduling Instructions:     Side: Right-sided     Sedation: With Sedation.     Timeframe: ASAP   LUMBAR FACET(MEDIAL BRANCH NERVE BLOCK) MBNB    Standing Status:   Future    Standing Expiration Date:   11/27/2022    Scheduling Instructions:     Procedure: Lumbar facet block (AKA.: Lumbosacral medial branch nerve block)     Side: Bilateral     Level: L3-4, L4-5, and L5-S1 Facets (L2, L3, L4, L5, and S1 Medial Branch)     Sedation: With Sedation.     Timeframe: ASAP    Order Specific Question:   Where will this procedure be performed?    Answer:   [facility]   Informed Consent Details: Physician/Practitioner Attestation; Transcribe to consent form and obtain patient signature    Do not administer NSAIDs (Toradol, etc.) if patient  has an  allergy or intolerance to NSAIDs or if patient has CKD (chronic kidney disease or failure). Avoid the use of Muscle Relaxants (orphenedrine/Norflex, etc.) if patient has allergy or intolerance to this medication.    Scheduling Instructions:     Nursing orders: Complete pain questionnaire before administration of therapy. Document location, laterality, onset, level, trigger, and current medications taken for the pain.    Order Specific Question:   Physician/Practitioner attestation of informed consent for procedure/surgical case    Answer:   I, the physician/practitioner, attest that I have discussed with the patient the benefits, risks, side effects, alternatives, likelihood of achieving goals and potential problems during recovery for the procedure that I have provided informed consent.    Order Specific Question:   Procedure    Answer:   IM injection of therapeutic substance    Order Specific Question:   Physician/Practitioner performing the procedure    Answer:   Jarris Kortz A. [name], MD    Order Specific Question:   Indication/Reason    Answer:   Acute on chronic pain   Follow-up plan:   Return for (ECT): (B) L-FCT Blk + (R) IA Hip inj. & TB inj..     Interventional Therapies  Risk Factors  Considerations:   MO  Multiple allergies Anaphylactic Allergy to contrast dye.  Allergic to NSAIDs.  High Risk for SUD  (11/21/2021) UDS. (+) COCAINE.   Planned  Pending:   Therapeutic bilateral lumbar facet MBB #4 + (R) IA Hip #6 + TBI #4    Under consideration:   Diagnostic caudal ESI + epidurogram  Diagnostic right CESI  Diagnostic/therapeutic right cervical facet MBB #2 Diagnostic left SI Blk    Completed:   Therapeutic bilateral IA hip R5L6 + (B) Trochanteric bursae inj. R3L4 (05/09/2022) (100/100/85/85)  Palliative left trochanteric bursa x2 (09/18/2021) (100/100/90)  Palliative right trochanteric bursa x1 (01/25/2021) (100/100/90)  Palliative right suprascapular NB x3 (08/08/2020)  (50/50/100/90-100)   Palliative left suprascapular NB x4 (02/22/2021) (100/100/90/90)  Palliative bilateral suprascapular nerve RFA x1 (05/13/2019) (100/100/90/90-100)  Therapeutic right IA hip joint injection x3 (06/01/2020) (100/100/100/90-100)  Therapeutic left IA hip joint injection x4 (09/18/2021) (100/100/90)  Therapeutic bilateral IA hip joint inj. x1 (12/03/2021) (by Dr. Gillis Santa) (100/100/90)  Diagnostic bilateral lumbar facet MBB x3 (09/30/2017) (100/75/60)  Palliative right lumbar facet RFA x2 (12/16/2017) (100/100/85)  Palliative left lumbar facet RFA x2 (02/12/2018) [facility])  Diagnostic right cervical facet MBB x2 (04/11/2022) (100/100/100/100)  Diagnostic left cervical facet MBB x1 (09/30/2016) (90/100/0)  Diagnostic right SI joint block x1 (06/12/2017) (DNKFU -0/0/80)    Therapeutic  Palliative (PRN) options:   Therapeutic trochanteric bursa   Therapeutic suprascapular NB   Therapeutic suprascapular nerve RFA   Therapeutic IA hip joint injection   Therapeutic lumbar facet MBB   Therapeutic lumbar facet RFA   Therapeutic SI Blk         Recent Visits Date Type Provider Dept  08/26/22 Office Visit Milinda Pointer, MD [facility]  06/04/22 Office Visit Milinda Pointer, MD [facility]  Showing recent visits within past 90 days and meeting all other requirements Today's Visits Date Type Provider Dept  08/29/22 Office Visit Milinda Pointer, MD [facility]  Showing today's visits and meeting all other requirements Future Appointments No visits were found meeting these conditions. Showing future appointments within next 90 days and meeting all other requirements  I discussed the assessment and treatment plan with the patient. The patient was provided an opportunity to ask questions  and all were answered. The patient agreed with the plan and demonstrated an understanding of the instructions.  Patient advised to call back or seek an  in-person evaluation if the symptoms or condition worsens.  Duration of encounter: 42 minutes.  Total time on encounter, as per AMA guidelines included both the face-to-face and non-face-to-face time personally spent by the physician and/or other qualified health care professional(s) on the day of the encounter (includes time in activities that require the physician or other qualified health care professional and does not include time in activities normally performed by clinical staff). Physician's time may include the following activities when performed: Preparing to see the patient (e.g., pre-charting review of records, searching for previously ordered imaging, lab work, and nerve conduction tests) Review of prior analgesic pharmacotherapies. Reviewing PMP Interpreting ordered tests (e.g., lab work, imaging, nerve conduction tests) Performing post-procedure evaluations, including interpretation of diagnostic procedures Obtaining and/or reviewing separately obtained history Performing a medically appropriate examination and/or evaluation Counseling and educating the patient/family/caregiver Ordering medications, tests, or procedures Referring and communicating with other health care professionals (when not separately reported) Documenting clinical information in the electronic or other health record Independently interpreting results (not separately reported) and communicating results to the patient/ family/caregiver Care coordination (not separately reported)  Note by: Gaspar Cola, MD Date: 08/29/2022; Time: 1:42 PM

## 2022-08-29 NOTE — Patient Instructions (Signed)

## 2022-09-17 ENCOUNTER — Ambulatory Visit: Payer: Medicaid Other | Attending: Pain Medicine | Admitting: Pain Medicine

## 2022-09-17 ENCOUNTER — Ambulatory Visit
Admission: RE | Admit: 2022-09-17 | Discharge: 2022-09-17 | Disposition: A | Discharge status: Home / Self Care | Payer: Medicaid Other | Source: Ambulatory Visit | Attending: Pain Medicine | Admitting: Pain Medicine

## 2022-09-17 ENCOUNTER — Encounter: Payer: Self-pay | Admitting: Pain Medicine

## 2022-09-17 VITALS — BP 138/75 | HR 92 | Temp 97.1°F | Resp 16 | Ht 65.0 in | Wt 230.0 lb

## 2022-09-17 DIAGNOSIS — M5459 Other low back pain: Secondary | ICD-10-CM

## 2022-09-17 DIAGNOSIS — M47816 Spondylosis without myelopathy or radiculopathy, lumbar region: Secondary | ICD-10-CM

## 2022-09-17 DIAGNOSIS — M7061 Trochanteric bursitis, right hip: Secondary | ICD-10-CM | POA: Diagnosis present

## 2022-09-17 DIAGNOSIS — R937 Abnormal findings on diagnostic imaging of other parts of musculoskeletal system: Secondary | ICD-10-CM

## 2022-09-17 DIAGNOSIS — Z91041 Radiographic dye allergy status: Secondary | ICD-10-CM | POA: Diagnosis present

## 2022-09-17 DIAGNOSIS — M25551 Pain in right hip: Secondary | ICD-10-CM | POA: Diagnosis present

## 2022-09-17 DIAGNOSIS — M4316 Spondylolisthesis, lumbar region: Secondary | ICD-10-CM

## 2022-09-17 DIAGNOSIS — M7062 Trochanteric bursitis, left hip: Secondary | ICD-10-CM | POA: Diagnosis present

## 2022-09-17 DIAGNOSIS — G8929 Other chronic pain: Secondary | ICD-10-CM | POA: Diagnosis present

## 2022-09-17 DIAGNOSIS — M545 Low back pain, unspecified: Secondary | ICD-10-CM

## 2022-09-17 DIAGNOSIS — M7918 Myalgia, other site: Secondary | ICD-10-CM | POA: Diagnosis present

## 2022-09-17 DIAGNOSIS — M51379 Other intervertebral disc degeneration, lumbosacral region without mention of lumbar back pain or lower extremity pain: Secondary | ICD-10-CM

## 2022-09-17 DIAGNOSIS — M5137 Other intervertebral disc degeneration, lumbosacral region: Secondary | ICD-10-CM

## 2022-09-17 DIAGNOSIS — M1611 Unilateral primary osteoarthritis, right hip: Secondary | ICD-10-CM

## 2022-09-17 DIAGNOSIS — Z6841 Body Mass Index (BMI) 40.0 and over, adult: Secondary | ICD-10-CM | POA: Diagnosis present

## 2022-09-17 DIAGNOSIS — Z87892 Personal history of anaphylaxis: Secondary | ICD-10-CM | POA: Diagnosis present

## 2022-09-17 DIAGNOSIS — M47817 Spondylosis without myelopathy or radiculopathy, lumbosacral region: Secondary | ICD-10-CM

## 2022-09-17 MED ORDER — ROPIVACAINE HCL 2 MG/ML IJ SOLN
9.0000 mL | Freq: Once | INTRAMUSCULAR | Status: AC
  Administered 2022-09-17: 9 mL via INTRA_ARTICULAR

## 2022-09-17 MED ORDER — LIDOCAINE HCL 2 % IJ SOLN
INTRAMUSCULAR | Status: AC
  Filled 2022-09-17: qty 20

## 2022-09-17 MED ORDER — METHYLPREDNISOLONE ACETATE 80 MG/ML IJ SUSP
80.0000 mg | Freq: Once | INTRAMUSCULAR | Status: AC
  Administered 2022-09-17: 80 mg via INTRA_ARTICULAR

## 2022-09-17 MED ORDER — PENTAFLUOROPROP-TETRAFLUOROETH EX AERO
INHALATION_SPRAY | Freq: Once | CUTANEOUS | Status: AC
  Administered 2022-09-17: 30 via TOPICAL
  Filled 2022-09-17: qty 116

## 2022-09-17 MED ORDER — MIDAZOLAM HCL 5 MG/5ML IJ SOLN
INTRAMUSCULAR | Status: AC
  Filled 2022-09-17: qty 5

## 2022-09-17 MED ORDER — FENTANYL CITRATE (PF) 100 MCG/2ML IJ SOLN
25.0000 ug | INTRAMUSCULAR | Status: DC | PRN
  Administered 2022-09-17: 100 ug via INTRAVENOUS

## 2022-09-17 MED ORDER — FENTANYL CITRATE (PF) 100 MCG/2ML IJ SOLN
INTRAMUSCULAR | Status: AC
  Filled 2022-09-17: qty 2

## 2022-09-17 MED ORDER — TRIAMCINOLONE ACETONIDE 40 MG/ML IJ SUSP
80.0000 mg | Freq: Once | INTRAMUSCULAR | Status: AC
  Administered 2022-09-17: 80 mg

## 2022-09-17 MED ORDER — LACTATED RINGERS IV SOLN: Freq: Once | INTRAVENOUS | Status: AC

## 2022-09-17 MED ORDER — ROPIVACAINE HCL 2 MG/ML IJ SOLN
18.0000 mL | Freq: Once | INTRAMUSCULAR | Status: AC
  Administered 2022-09-17: 18 mL via PERINEURAL

## 2022-09-17 MED ORDER — LIDOCAINE HCL 2 % IJ SOLN
20.0000 mL | Freq: Once | INTRAMUSCULAR | Status: AC
  Administered 2022-09-17: 400 mg

## 2022-09-17 MED ORDER — TRIAMCINOLONE ACETONIDE 40 MG/ML IJ SUSP
INTRAMUSCULAR | Status: AC
  Filled 2022-09-17: qty 2

## 2022-09-17 MED ORDER — METHYLPREDNISOLONE ACETATE 80 MG/ML IJ SUSP
INTRAMUSCULAR | Status: AC
  Filled 2022-09-17: qty 1

## 2022-09-17 MED ORDER — MIDAZOLAM HCL 5 MG/5ML IJ SOLN
0.5000 mg | Freq: Once | INTRAMUSCULAR | Status: AC
  Administered 2022-09-17: 3 mg via INTRAVENOUS

## 2022-09-17 MED ORDER — ROPIVACAINE HCL 2 MG/ML IJ SOLN
INTRAMUSCULAR | Status: AC
  Filled 2022-09-17: qty 40

## 2022-09-17 NOTE — Progress Notes (Signed)
PROVIDER NOTE: Interpretation of information contained herein should be left to medically-trained personnel. Specific patient instructions are provided elsewhere under "Patient Instructions" section of medical record. This document was created in part using STT-dictation technology, any transcriptional errors that may result from this process are unintentional.  Patient: Danielle [name] Type: Established DOB: 08-24-1960 MRN: [id number] PCP: Danae Orleans, MD  Service: Procedure DOS: 09/17/2022 Setting: Ambulatory Location: Ambulatory outpatient facility Delivery: Face-to-face Provider: Gaspar Cola, MD Specialty: Interventional Pain Management Specialty designation: 09 Location: Outpatient facility Ref. Prov.: No ref. provider found       Interventional Therapy   Procedure No.1: Lumbar Facet, Medial Branch Block(s) #4  Laterality: Bilateral  Level: L3, L4, L5, and S1 Medial Branch Level(s). Injecting these levels blocks the L4-5 and L5-S1 lumbar facet joints. Imaging: Fluoroscopic guidance         Anesthesia: Local anesthesia (1-2% Lidocaine) Anxiolysis: IV Versed 3.0 mg Sedation: Moderate Sedation Fentanyl 1 mL (50 mcg) DOS: 09/17/2022 Performed by: Gaspar Cola, MD  Primary Purpose: Diagnostic/Therapeutic Indications: Low back pain severe enough to impact quality of life or function. 1. Chronic low back pain (Bilateral) (R>L) w/o sciatica   2. 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 Lumbar facet joint pain   4. Osteoarthritis of facet joint of lumbar spine   5. Spondylosis without myelopathy or radiculopathy, lumbosacral region   6. Anterolisthesis of lumbar spine (L4/L5)   7. DDD (degenerative disc disease), lumbosacral   8. Morbid obesity with BMI of 40.0-44.9, adult ([facility])    Procedure No.2: Hip #6 & bursae injection #4  Laterality: Right (-RT)  Bursae: Trochanteric  Laterality: Right (-RT)  Approach: Percutaneous posterolateral approach. Level: Lower  pelvic and hip joint level.  Imaging: Fluoroscopy-guided         DOS: 09/17/2022  Performed by: Gaspar Cola, MD  Purpose: Diagnostic/Therapeutic Indications: Hip pain severe enough to impact quality of life or function. Rationale (medical necessity): procedure needed and proper for the diagnosis and/or treatment of Ms. Plaugher's medical symptoms and needs. 1. Chronic hip pain (Right)   2. Osteoarthritis of hip (Right)   3. Trochanteric bursitis of hip (Right)   4. Greater trochanteric bursitis of hips (Bilateral)   5. Right buttock pain    Morbid obesity with BMI of 40.0-44.9, adult ([facility])    History of allergy to iodine & radiographic dye    History of drug-induced anaphylaxis (Contrast Dye)    NAS-11 Pain score:   Pre-procedure: 8 /10   Post-procedure: 4  (lower back 4, right hip 0)/10     Position / Prep / Materials:  Positionfor procedure #1: Prone  Prep solution: DuraPrep (Iodine Povacrylex [0.7% available iodine] and Isopropyl Alcohol, 74% w/w) Area Prepped: Posterolateral Lumbosacral Spine (Wide prep: From the lower border of the scapula down to the end of the tailbone and from flank to flank.)  Materials:  Tray: Block Needle(s):  Type: Spinal  Gauge (G): 22  Length: 5-in Qty: 4     Position for procedure #2: Prone  Prep solution: DuraPrep (Iodine Povacrylex [0.7% available iodine] and Isopropyl Alcohol, 74% w/w) Prep Area:  Entire Posterolateral hip area. Materials:  Tray: Block tray Needle(s):  Type: Spinal  Gauge (G): 22  Length: 7-in  Qty: 1  Pre-op H&P Assessment:  Danielle [name] is a 62 y.o. (year old), female patient, seen today for interventional treatment. She  has a past surgical history that includes necksurgery (10/21/2014); Back surgery (2013); Foot surgery (Right); Elbow surgery (Left);  Carpal tunnel release (Right); Nose surgery; Partial hysterectomy; Total shoulder replacement (Bilateral); and Colonoscopy with propofol (N/A, 02/05/2017). [name] has a current medication list which includes the following prescription(s): albuterol, azelastine hcl, cetirizine, cholecalciferol, citalopram, cyclobenzaprine, docusate sodium, levothyroxine, magnesium hydroxide, spiriva handihaler, symbicort, and diclofenac sodium, and the following Facility-Administered Medications: fentanyl. Her primarily concern today is the Back Pain (lower)  Initial Vital Signs:  Pulse/HCG Rate: 92ECG Heart Rate: 80 (NSR) Temp: (!) 97.1 F (36.2 C) Resp: 18 BP: (!) 112/90 SpO2: 97 %  BMI: Estimated body mass index is 38.27 kg/m as calculated from the following:   Height as of this encounter: '5\' 5"'$  (1.651 m).   Weight as of this encounter: 230 lb (104.3 kg).  Risk Assessment: Allergies: Reviewed. She is allergic to aripiprazole, duloxetine, gabapentin, gold, iodinated contrast media, naproxen sodium, nortriptyline hcl, nsaids, pregabalin, meperidine, ace inhibitors, aspirin, cefpodoxime, fluoxetine, fluticasone-salmeterol, levofloxacin, lithium, paroxetine, tape, telithromycin, theophylline, topiramate, trazodone, triamcinolone, venlafaxine, bupropion, ketorolac tromethamine, morphine, and moxifloxacin.  Allergy Precautions: None required Coagulopathies: Reviewed. None identified.  Blood-thinner therapy: None at this time Active Infection(s): Reviewed. None identified. Ms. Herriage is afebrile  Site Confirmation: Ms. Fells was asked to confirm the procedure and laterality before marking the site Procedure checklist: Completed Consent: Before the procedure and under the influence of no sedative(s), amnesic(s), or anxiolytics, the patient was informed of the treatment options, risks and possible complications. To fulfill our ethical and legal obligations, as recommended by the American Medical Association's Code of Ethics, I have informed the patient of my clinical impression; the nature and purpose of the treatment or procedure; the risks, benefits, and possible  complications of the intervention; the alternatives, including doing nothing; the risk(s) and benefit(s) of the alternative treatment(s) or procedure(s); and the risk(s) and benefit(s) of doing nothing. The patient was provided information about the general risks and possible complications associated with the procedure. These may include, but are not limited to: failure to achieve desired goals, infection, bleeding, organ or nerve damage, allergic reactions, paralysis, and death. In addition, the patient was informed of those risks and complications associated to Spine-related procedures, such as failure to decrease pain; infection (i.e.: Meningitis, epidural or intraspinal abscess); bleeding (i.e.: epidural hematoma, subarachnoid hemorrhage, or any other type of intraspinal or peri-dural bleeding); organ or nerve damage (i.e.: Any type of peripheral nerve, nerve root, or spinal cord injury) with subsequent damage to sensory, motor, and/or autonomic systems, resulting in permanent pain, numbness, and/or weakness of one or several areas of the body; allergic reactions; (i.e.: anaphylactic reaction); and/or death. Furthermore, the patient was informed of those risks and complications associated with the medications. These include, but are not limited to: allergic reactions (i.e.: anaphylactic or anaphylactoid reaction(s)); adrenal axis suppression; blood sugar elevation that in diabetics may result in ketoacidosis or comma; water retention that in patients with history of congestive heart failure may result in shortness of breath, pulmonary edema, and decompensation with resultant heart failure; weight gain; swelling or edema; medication-induced neural toxicity; particulate matter embolism and blood vessel occlusion with resultant organ, and/or nervous system infarction; and/or aseptic necrosis of one or more joints. Finally, the patient was informed that Medicine is not an exact science; therefore, there is also  the possibility of unforeseen or unpredictable risks and/or possible complications that may result in a catastrophic outcome. The patient indicated having understood very clearly. We have given the patient no guarantees and we have made no promises. Enough time was given to the patient to  ask questions, all of which were answered to the patient's satisfaction. Ms. Kinner has indicated that she wanted to continue with the procedure. Attestation: I, the ordering provider, attest that I have discussed with the patient the benefits, risks, side-effects, alternatives, likelihood of achieving goals, and potential problems during recovery for the procedure that I have provided informed consent. Date  Time: 09/17/2022  9:24 AM  Pre-Procedure Preparation:  Monitoring: As per clinic protocol. Respiration, ETCO2, SpO2, BP, heart rate and rhythm monitor placed and checked for adequate function Safety Precautions: Patient was assessed for positional comfort and pressure points before starting the procedure. Time-out: I initiated and conducted the "Time-out" before starting the procedure, as per protocol. The patient was asked to participate by confirming the accuracy of the "Time Out" information. Verification of the correct person, site, and procedure were performed and confirmed by me, the nursing staff, and the patient. "Time-out" conducted as per Joint Commission's Universal Protocol (UP.01.01.01). Time: 1027  Description of Procedure #1:  Laterality: (see above) Targeted Levels: (see above)  Safety Precautions: Aspiration looking for blood return was conducted prior to all injections. At no point did we inject any substances, as a needle was being advanced. Before injecting, the patient was told to immediately notify me if she was experiencing any new onset of "ringing in the ears, or metallic taste in the mouth". No attempts were made at seeking any paresthesias. Safe injection practices and needle disposal  techniques used. Medications properly checked for expiration dates. SDV (single dose vial) medications used. After the completion of the procedure, all disposable equipment used was discarded in the proper designated medical waste containers. Local Anesthesia: Protocol guidelines were followed. The patient was positioned over the fluoroscopy table. The area was prepped in the usual manner. The time-out was completed. The target area was identified using fluoroscopy. A 12-in long, straight, sterile hemostat was used with fluoroscopic guidance to locate the targets for each level blocked. Once located, the skin was marked with an approved surgical skin marker. Once all sites were marked, the skin (epidermis, dermis, and hypodermis), as well as deeper tissues (fat, connective tissue and muscle) were infiltrated with a small amount of a short-acting local anesthetic, loaded on a 10cc syringe with a 25G, 1.5-in  Needle. An appropriate amount of time was allowed for local anesthetics to take effect before proceeding to the next step. Local Anesthetic: Lidocaine 2.0% The unused portion of the local anesthetic was discarded in the proper designated containers. Technical description of process:   L3 Medial Branch Nerve Block (MBB): The target area for the L3 medial branch is at the junction of the postero-lateral aspect of the superior articular process and the superior, posterior, and medial edge of the transverse process of L4. Under fluoroscopic guidance, a Quincke needle was inserted until contact was made with os over the superior postero-lateral aspect of the pedicular shadow (target area). After negative aspiration for blood, 0.5 mL of the nerve block solution was injected without difficulty or complication. The needle was removed intact. L4 Medial Branch Nerve Block (MBB): The target area for the L4 medial branch is at the junction of the postero-lateral aspect of the superior articular process and the  superior, posterior, and medial edge of the transverse process of L5. Under fluoroscopic guidance, a Quincke needle was inserted until contact was made with os over the superior postero-lateral aspect of the pedicular shadow (target area). After negative aspiration for blood, 0.5 mL of the nerve block solution was injected  without difficulty or complication. The needle was removed intact. L5 Medial Branch Nerve Block (MBB): The target area for the L5 medial branch is at the junction of the postero-lateral aspect of the superior articular process and the superior, posterior, and medial edge of the sacral ala. Under fluoroscopic guidance, a Quincke needle was inserted until contact was made with os over the superior postero-lateral aspect of the pedicular shadow (target area). After negative aspiration for blood, 0.5 mL of the nerve block solution was injected without difficulty or complication. The needle was removed intact. S1 Medial Branch Nerve Block (MBB): The target area for the S1 medial branch is at the posterior and inferior 6 o'clock position of the L5-S1 facet joint. Under fluoroscopic guidance, the Quincke needle inserted for the L5 MBB was redirected until contact was made with os over the inferior and postero aspect of the sacrum, at the 6 o' clock position under the L5-S1 facet joint (Target area). After negative aspiration for blood, 0.5 mL of the nerve block solution was injected without difficulty or complication. The needle was removed intact.  Once the entire procedure was completed, the treated area was cleaned, making sure to leave some of the prepping solution back to take advantage of its long term bactericidal properties.         Illustration of the posterior view of the lumbar spine and the posterior neural structures. Laminae of L2 through S1 are labeled. DPRL5, dorsal primary ramus of L5; DPRS1, dorsal primary ramus of S1; DPR3, dorsal primary ramus of L3; FJ, facet  (zygapophyseal) joint L3-L4; I, inferior articular process of L4; LB1, lateral branch of dorsal primary ramus of L1; IAB, inferior articular branches from L3 medial branch (supplies L4-L5 facet joint); IBP, intermediate branch plexus; MB3, medial branch of dorsal primary ramus of L3; NR3, third lumbar nerve root; S, superior articular process of L5; SAB, superior articular branches from L4 (supplies L4-5 facet joint also); TP3, transverse process of L3.  Start Time: 1027 hrs.  Imaging Guidance (Spinal) for procedure #1:  Type of Imaging Technique: Fluoroscopy Guidance (Spinal) Indication(s): Assistance in needle guidance and placement for procedures requiring needle placement in or near specific anatomical locations not easily accessible without such assistance. Exposure Time: Please see nurses notes. Contrast: None used. Fluoroscopic Guidance: I was personally present during the use of fluoroscopy. "Tunnel Vision Technique" used to obtain the best possible view of the target area. Parallax error corrected before commencing the procedure. "Direction-depth-direction" technique used to introduce the needle under continuous pulsed fluoroscopy. Once target was reached, antero-posterior, oblique, and lateral fluoroscopic projection used confirm needle placement in all planes. Images permanently stored in EMR.  Upper edge of pain.      Interpretation: No contrast injected. I personally interpreted the imaging intraoperatively. Adequate needle placement confirmed in multiple planes. Permanent images saved into the patient's record.  Description/Narrative of Procedure #2:  Rationale (medical necessity): procedure needed and proper for the diagnosis and/or treatment of the patient's medical symptoms and needs. Procedural Technique Safety Precautions: Aspiration looking for blood return was conducted prior to all injections. At no point did we inject any substances, as a needle was being advanced. No  attempts were made at seeking any paresthesias. Safe injection practices and needle disposal techniques used. Medications properly checked for expiration dates. SDV (single dose vial) medications used. Description of the Procedure: Protocol guidelines were followed. The patient was assisted into a comfortable position. The target area was identified and the area prepped in the  usual manner. Skin & deeper tissues infiltrated with local anesthetic. Appropriate amount of time allowed to pass for local anesthetics to take effect. The procedure needles were then advanced to the target area. Proper needle placement secured. Negative aspiration confirmed. Solution injected in intermittent fashion, asking for systemic symptoms every 0.5cc of injectate. The needles were then removed and the area cleansed, making sure to leave some of the prepping solution back to take advantage of its long term bactericidal properties.  Technical description of procedure:  Skin & deeper tissues infiltrated with local anesthetic. Appropriate amount of time allowed to pass for local anesthetics to take effect. The procedure needles were then advanced to the target area. Proper needle placement secured. Negative aspiration confirmed. Solution injected in intermittent fashion, asking for systemic symptoms every 0.5cc of injectate. The needles were then removed and the area cleansed, making sure to leave some of the prepping solution back to take advantage of its long term bactericidal properties.             Vitals:   09/17/22 1045 09/17/22 1055 09/17/22 1105 09/17/22 1115  BP: (!) 141/107 127/72 (!) 145/81 138/75  Pulse:      Resp: '16 15 16 16  '$ Temp:      SpO2: 97% 95% 91% 95%  Weight:      Height:       End Time: 1044 hrs.  Imaging Guidance (Non-Spinal) for procedure #2:  Type of Imaging Technique: Fluoroscopy Guidance (Non-Spinal) Indication(s): Assistance in needle guidance and placement for procedures requiring  needle placement in or near specific anatomical locations not easily accessible without such assistance. Exposure Time: Please see nurses notes. Contrast: None used. Fluoroscopic Guidance: I was personally present during the use of fluoroscopy. "Tunnel Vision Technique" used to obtain the best possible view of the target area. Parallax error corrected before commencing the procedure. "Direction-depth-direction" technique used to introduce the needle under continuous pulsed fluoroscopy. Once target was reached, antero-posterior, oblique, and lateral fluoroscopic projection used confirm needle placement in all planes. Images permanently stored in EMR.     Interpretation: No contrast injected.  Post-operative Assessment:  Post-procedure Vital Signs:  Pulse/HCG Rate: 9287 Temp: (!) 97.1 F (36.2 C) Resp: 16 BP: 138/75 SpO2: 95 %  EBL: None  Complications: No immediate post-treatment complications observed by team, or reported by patient.  Note: The patient tolerated the entire procedure well. A repeat set of vitals were taken after the procedure and the patient was kept under observation following institutional policy, for this type of procedure. Post-procedural neurological assessment was performed, showing return to baseline, prior to discharge. The patient was provided with post-procedure discharge instructions, including a section on how to identify potential problems. Should any problems arise concerning this procedure, the patient was given instructions to immediately contact us, at any time, without hesitation. In any case, we plan to contact the patient by telephone for a follow-up status report regarding this interventional procedure.  Comments:  No additional relevant information.  Plan of Care (POC)  Orders:  Orders Placed This Encounter  Procedures   LUMBAR FACET(MEDIAL BRANCH NERVE BLOCK) MBNB    Scheduling Instructions:     Procedure: Lumbar facet block (AKA.: Lumbosacral  medial branch nerve block)     Side: Bilateral     Level: L3-4, L4-5, and L5-S1 Facets (L2, L3, L4, L5, and S1 Medial Branch Nerves)     Sedation: Patient's choice.     Timeframe: Today    Order Specific Question:  Where will this procedure be performed?    Answer:   [facility]   HIP INJECTION    Scheduling Instructions:     Side: Right-sided     Sedation: With Sedation.     Timeframe: Today   HIP INJECTION    Purpose: Therapeutic Indication: Hip pain 2ry to Trochanteric Burlitis right (M70.61).    Scheduling Instructions:     Procedure: Trochanteric bursa injection     Laterality: Right-sided     Sedation: With Sedation.     Timeframe: Today   [facility] C-ARM 1-60 MIN NO REPORT    Intraoperative interpretation by procedural physician at [facility].    Standing Status:   Standing    Number of Occurrences:   1    Order Specific Question:   Reason for exam:    Answer:   Assistance in needle guidance and placement for procedures requiring needle placement in or near specific anatomical locations not easily accessible without such assistance.   Informed Consent Details: Physician/Practitioner Attestation; Transcribe to consent form and obtain patient signature    Nursing Order: Transcribe to consent form and obtain patient signature. Note: Always confirm laterality of pain with Ms. Domingo Cocking, before procedure.    Order Specific Question:   Physician/Practitioner attestation of informed consent for procedure/surgical case    Answer:   I, the physician/practitioner, attest that I have discussed with the patient the benefits, risks, side effects, alternatives, likelihood of achieving goals and potential problems during recovery for the procedure that I have provided informed consent.    Order Specific Question:   Procedure    Answer:   Lumbar Facet Block  under fluoroscopic guidance    Order Specific Question:   Physician/Practitioner performing the procedure     Answer:   Sabella Traore A. [name] MD    Order Specific Question:   Indication/Reason    Answer:   Low Back Pain, with our without leg pain, due to Facet Joint Arthralgia (Joint Pain) Spondylosis (Arthritis of the Spine), without myelopathy or radiculopathy (Nerve Damage).   Provide equipment / supplies at bedside    Procedure tray: "Block Tray" (Disposable  single use) Skin infiltration needle: Regular 1.5-in, 25-G, (x1) Block Needle type: Spinal Amount/quantity: 4 Size: Medium (5-inch) Gauge: 22G    Standing Status:   Standing    Number of Occurrences:   1    Order Specific Question:   Specify    Answer:   Block Tray   Informed Consent Details: Physician/Practitioner Attestation; Transcribe to consent form and obtain patient signature    Nursing Order: Transcribe to consent form and obtain patient signature. Note: Always confirm laterality of pain with Ms. Domingo Cocking, before procedure.    Order Specific Question:   Physician/Practitioner attestation of informed consent for procedure/surgical case    Answer:   I, the physician/practitioner, attest that I have discussed with the patient the benefits, risks, side effects, alternatives, likelihood of achieving goals and potential problems during recovery for the procedure that I have provided informed consent.    Order Specific Question:   Procedure    Answer:   Hip injection    Order Specific Question:   Physician/Practitioner performing the procedure    Answer:   Aniela Caniglia A. [name], MD    Order Specific Question:   Indication/Reason    Answer:   Hip Joint Pain (Arthralgia)   Informed Consent Details: Physician/Practitioner Attestation; Transcribe to consent form and obtain patient signature    Note: Always confirm  laterality of pain with Ms. Domingo Cocking, before procedure. Transcribe to consent form and obtain patient signature.    Order Specific Question:   Physician/Practitioner attestation of informed consent for procedure/surgical case     Answer:   I, the physician/practitioner, attest that I have discussed with the patient the benefits, risks, side effects, alternatives, likelihood of achieving goals and potential problems during recovery for the procedure that I have provided informed consent.    Order Specific Question:   Procedure    Answer:   Hip bursa injection    Order Specific Question:   Physician/Practitioner performing the procedure    Answer:   Shantay Sonn A. [name], MD    Order Specific Question:   Indication/Reason    Answer:   Hip bursitis   Provide equipment / supplies at bedside    Procedure tray: "Block Tray" (Disposable  single use) Skin infiltration needle: Regular 1.5-in, 25-G, (x1) Block Needle type: Spinal Amount/quantity: 1 Size: Long (7-inch) Gauge: 22G    Standing Status:   Standing    Number of Occurrences:   1    Order Specific Question:   Specify    Answer:   Block Tray   Chronic Opioid Analgesic:  No chronic opioid analgesics therapy prescribed by our practice.  Discontinued (11/21/2021) UDS. (+) COCAINE.    Medications ordered for procedure: Meds ordered this encounter  Medications   lidocaine (XYLOCAINE) 2 % (with pres) injection 400 mg   pentafluoroprop-tetrafluoroeth (GEBAUERS) aerosol   lactated ringers infusion   midazolam (VERSED) 5 MG/5ML injection 0.5-2 mg    Make sure Flumazenil is available in the pyxis when using this medication. If oversedation occurs, administer 0.2 mg IV over 15 sec. If after 45 sec no response, administer 0.2 mg again over 1 min; may repeat at 1 min intervals; not to exceed 4 doses (1 mg)   fentaNYL (SUBLIMAZE) injection 25-50 mcg    Make sure Narcan is available in the pyxis when using this medication. In the event of respiratory depression (RR< 8/min): Titrate NARCAN (naloxone) in increments of 0.1 to 0.2 mg IV at 2-3 minute intervals, until desired degree of reversal.   ropivacaine (PF) 2 mg/mL (0.2%) (NAROPIN) injection 18 mL   triamcinolone acetonide  (KENALOG-40) injection 80 mg   methylPREDNISolone acetate (DEPO-MEDROL) injection 80 mg   ropivacaine (PF) 2 mg/mL (0.2%) (NAROPIN) injection 9 mL   Medications administered: We administered lidocaine, pentafluoroprop-tetrafluoroeth, lactated ringers, midazolam, fentaNYL, ropivacaine (PF) 2 mg/mL (0.2%), triamcinolone acetonide, methylPREDNISolone acetate, and ropivacaine (PF) 2 mg/mL (0.2%).  See the medical record for exact dosing, route, and time of administration.  Follow-up plan:   Return in about 2 weeks (around 10/01/2022) for Proc-day (T,Th), (Face2F), (PPE).       Interventional Therapies  Risk Factors  Considerations:   Allergy (Anaphylactic-type): Contrast dye.  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 (+) COCAINE.   Planned  Pending:   Therapeutic bilateral lumbar facet MBB #4 + (R) IA Hip #6 + TBI #4    Under consideration:   Diagnostic caudal ESI + epidurogram  Diagnostic right CESI  Diagnostic/therapeutic right cervical facet MBB #2 Diagnostic left SI Blk    Completed:   Therapeutic bilateral IA hip R5L6 + (B) Trochanteric bursae inj. R3L4 (05/09/2022) (100/100/85/85)  Palliative left trochanteric bursa x2 (09/18/2021) (100/100/90)  Palliative right trochanteric bursa x1 (01/25/2021) (100/100/90)  Palliative right  suprascapular NB x3 (08/08/2020) (50/50/100/90-100)   Palliative left suprascapular NB x4 (02/22/2021) (100/100/90/90)  Palliative bilateral suprascapular nerve RFA x1 (05/13/2019) (100/100/90/90-100)  Therapeutic right IA hip joint injection x3 (06/01/2020) (100/100/100/90-100)  Therapeutic left IA hip joint injection x4 (09/18/2021) (100/100/90)  Therapeutic bilateral IA hip joint inj. x1 (12/03/2021) (by Dr. Gillis Santa) (100/100/90)  Diagnostic bilateral lumbar facet MBB x3 (09/30/2017) (100/75/60)  Palliative right lumbar facet  RFA x2 (12/16/2017) (100/100/85)  Palliative left lumbar facet RFA x2 (02/12/2018) [facility])  Diagnostic right cervical facet MBB x2 (04/11/2022) (100/100/100/100)  Diagnostic left cervical facet MBB x1 (09/30/2016) (90/100/0)  Diagnostic right SI joint block x1 (06/12/2017) (DNKFU -0/0/80)    Therapeutic  Palliative (PRN) options:   Therapeutic trochanteric bursa   Therapeutic suprascapular NB   Therapeutic suprascapular nerve RFA   Therapeutic IA hip joint injection   Therapeutic lumbar facet MBB   Therapeutic lumbar facet RFA   Therapeutic SI Blk     Pharmacotherapy  Nonopioids transferred 05/15/2020: Zanaflex and Voltaren gel       Recent Visits Date Type Provider Dept  08/29/22 Office Visit Milinda Pointer, MD [facility]  Showing recent visits within past 90 days and meeting all other requirements Today's Visits Date Type Provider Dept  09/17/22 Procedure visit Milinda Pointer, MD [facility]  Showing today's visits and meeting all other requirements Future Appointments Date Type Provider Dept  10/01/22 Appointment Milinda Pointer, MD [facility]  Showing future appointments within next 90 days and meeting all other requirements  Disposition: Discharge home  Discharge (Date  Time): 09/17/2022; 1115 hrs.   Primary Care Physician: Danae Orleans, MD Location: [facility] Note by: Gaspar Cola, MD (TTS technology used. I apologize for any typographical errors that were not detected and corrected.) Date: 09/17/2022; Time: 11:33 AM  Disclaimer:  Medicine is not an Chief Strategy Officer. The only guarantee in medicine is that nothing is guaranteed. It is important to note that the decision to proceed with this intervention was based on the information collected from the patient. The Data and conclusions were drawn from the patient's questionnaire, the interview, and the physical examination. Because the information  was provided in large part by the patient, it cannot be guaranteed that it has not been purposely or unconsciously manipulated. Every effort has been made to obtain as much relevant data as possible for this evaluation. It is important to note that the conclusions that lead to this procedure are derived in large part from the available data. Always take into account that the treatment will also be dependent on availability of resources and existing treatment guidelines, considered by other Pain Management Practitioners as being common knowledge and practice, at the time of the intervention. For Medico-Legal purposes, it is also important to point out that variation in procedural techniques and pharmacological choices are the acceptable norm. The indications, contraindications, technique, and results of the above procedure should only be interpreted and judged by a Board-Certified Interventional Pain Specialist with extensive familiarity and expertise in the same exact procedure and technique.

## 2022-09-17 NOTE — Progress Notes (Signed)
Safety precautions to be maintained throughout the outpatient stay will include: orient to surroundings, keep bed in low position, maintain call bell within reach at all times, provide assistance with transfer out of bed and ambulation.  

## 2022-09-17 NOTE — Patient Instructions (Addendum)

## 2022-09-18 ENCOUNTER — Telehealth: Payer: Self-pay

## 2022-09-18 NOTE — Telephone Encounter (Signed)
Post procedure follow up.  Patient states she is doing good.  

## 2022-10-01 ENCOUNTER — Ambulatory Visit (HOSPITAL_BASED_OUTPATIENT_CLINIC_OR_DEPARTMENT_OTHER): Payer: Medicaid Other | Admitting: Pain Medicine

## 2022-10-01 DIAGNOSIS — Z91199 Patient's noncompliance with other medical treatment and regimen due to unspecified reason: Secondary | ICD-10-CM | POA: Insufficient documentation

## 2022-10-01 NOTE — Progress Notes (Signed)
(  10/01/2022) the patient called on the day of the procedure appointment indicating that she was sick and wanted to reschedule.

## 2022-10-09 NOTE — Progress Notes (Unsigned)
PROVIDER NOTE: Information contained herein reflects review and annotations entered in association with encounter. Interpretation of such information and data should be left to medically-trained personnel. Information provided to patient can be located elsewhere in the medical record under "Patient Instructions". Document created using STT-dictation technology, any transcriptional errors that may result from process are unintentional.    Patient: Danielle [name]  Service Category: E/M  Provider: Gaspar Cola, MD  DOB: 07-May-1961  DOS: 10/10/2022  Referring Provider: Danae Orleans, MD  MRN: [id number]  Specialty: Interventional Pain Management  PCP: Danae Orleans, MD  Type: Established Patient  Setting: Ambulatory outpatient    Location: Office  Delivery: Face-to-face     HPI  [name], a 62 y.o. year old female, is here today because of her No primary diagnosis found.. Ms. Moala primary complain today is No chief complaint on file.  Pertinent problems: Ms. Fornshell has Cervical spinal cord compression [facility]) (April, 2017); Cervical spinal stenosis; Chronic shoulder pain (1ry area of Pain) (Bilateral) (R>L); Chronic low back pain (3ry area of Pain) (Bilateral) (R>L) w/ sciatica (Right); Lumbar facet syndrome (Bilateral) (R>L); Lumbar spondylosis; Failed back surgical syndrome; Chronic lower extremity pain (Right); Chronic neck pain (Bilateral) (R>L); Chronic cervical radicular pain (Right); Ulnar neuropathy (Left); Hx of cervical spine surgery; Cervical spondylosis; Fibromyalgia; Chronic sacroiliac pain (Bilateral) (L>R); History of total arthroplasty of shoulder (Left); Chronic pain syndrome; Complaints of weakness of lower extremity; Anterolisthesis of lumbar spine (L4/L5); Cervical facet syndrome ([place]); Chronic musculoskeletal pain; Muscle spasm of back; Myofascial pain; Chronic hip pain (2ry area of Pain) (Bilateral) (R>L); 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); Trigger point  with back pain; Osteoarthritis of shoulder (Bilateral); Spondylosis without myelopathy or radiculopathy, lumbosacral region; Cellulitis of leg, right; Chronic trochanteric bursitis (Bilateral); Osteoarthritis involving multiple joints; Chronic shoulder pain after replacement; History of total shoulder replacement (Right); History of total replacement of shoulder joints (Bilateral); Leg edema, left; Spondylosis without myelopathy or radiculopathy, cervical region; Chronic hip pain (Right); Osteoarthritis of hip (Right); Chronic sacroiliac joint pain (Left); Other spondylosis, sacral and sacrococcygeal region; Sacroiliac joint dysfunction (Left); Somatic dysfunction of sacroiliac joint (Left); Enthesopathy of sacroiliac joint (Left); DDD (degenerative disc disease), cervical; DDD (degenerative disc disease), lumbosacral; Other intervertebral disc degeneration, lumbar region; Acetabular labrum tear, sequela (Left); Acetabular labrum tear, sequela (Right); Greater trochanteric bursitis of hips (Bilateral); Epidural fibrosis (Left: L5-S1); Chronic groin pain (Bilateral); Abnormal MRI, lumbar spine (09/28/2020); Abnormal MRI, hip (Bilateral) (10/04/2020); Chronic shoulder pain (Left); Cervicalgia; Chronic neck pain with history of cervical spinal surgery; Chronic hip pain (Left); Chronic groin pain (Left); Abnormal drug screen (11/21/2021); Chronic sacroiliac joint pain (Right); Chronic pain disorder; Arthralgia of hip; Trochanteric bursitis of hip (Right); Left thigh pain; Right buttock pain; Somatic dysfunction of sacroiliac joint (Right); Sacroiliac joint dysfunction (Right); Chronic low back pain (Bilateral) (R>L) w/o sciatica; Lumbar facet joint pain; and Osteoarthritis of facet joint of lumbar spine on their pertinent problem list. Pain Assessment: Severity of   is reported as a  /10. Location:    / . Onset:  . Quality:  . Timing:  . Modifying factor(s):  Marland Kitchen Vitals:  vitals were not taken for this visit.  BMI:  Estimated body mass index is 38.27 kg/m as calculated from the following:   Height as of 09/17/22: 5\' 5"  (1.651 m).   Weight as of 09/17/22: 230 lb (104.3 kg). Last encounter: 10/01/2022. Last procedure: 09/17/2022.  Reason for encounter: post-procedure evaluation and assessment. ***  Post-procedure evaluation   Procedure  No.1: Lumbar Facet, Medial Branch Block(s) #4  Laterality: Bilateral  Level: L3, L4, L5, and S1 Medial Branch Level(s). Injecting these levels blocks the L4-5 and L5-S1 lumbar facet joints. Imaging: Fluoroscopic guidance         Anesthesia: Local anesthesia (1-2% Lidocaine) Anxiolysis: IV Versed 3.0 mg Sedation: Moderate Sedation Fentanyl 1 mL (50 mcg) DOS: 09/17/2022 Performed by: Gaspar Cola, MD  Primary Purpose: Diagnostic/Therapeutic Indications: Low back pain severe enough to impact quality of life or function. 1. Chronic low back pain (Bilateral) (R>L) w/o sciatica   2. 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 Lumbar facet joint pain   4. Osteoarthritis of facet joint of lumbar spine   5. Spondylosis without myelopathy or radiculopathy, lumbosacral region   6. Anterolisthesis of lumbar spine (L4/L5)   7. DDD (degenerative disc disease), lumbosacral   8. Morbid obesity with BMI of 40.0-44.9, adult ([place])    Procedure No.2: Hip #6 & bursae injection #4  Laterality: Right (-RT)  Bursae: Trochanteric  Laterality: Right (-RT)  Approach: Percutaneous posterolateral approach. Level: Lower pelvic and hip joint level.  Imaging: Fluoroscopy-guided         DOS: 09/17/2022  Performed by: Gaspar Cola, MD  Purpose: Diagnostic/Therapeutic Indications: Hip pain severe enough to impact quality of life or function. Rationale (medical necessity): procedure needed and proper for the diagnosis and/or treatment of Ms. Steeber's medical symptoms and needs. 1. Chronic hip pain (Right)   2. Osteoarthritis of hip (Right)   3. Trochanteric bursitis of hip  (Right)   4. Greater trochanteric bursitis of hips (Bilateral)   5. Right buttock pain    Morbid obesity with BMI of 40.0-44.9, adult (HCC)    History of allergy to iodine & radiographic dye    History of drug-induced anaphylaxis (Contrast Dye)    NAS-11 Pain score:   Pre-procedure: 8 /10   Post-procedure: 4  (lower back 4, right hip 0)/10      Effectiveness:  Initial hour after procedure:   ***. Subsequent 4-6 hours post-procedure:   ***. Analgesia past initial 6 hours:   ***. Ongoing improvement:  Analgesic:  *** Function:    ***    ROM:    ***     Pharmacotherapy Assessment  Analgesic: No chronic opioid analgesics therapy prescribed by our practice.  Discontinued (11/21/2021) UDS. (+) COCAINE.    Monitoring: [place] PMP: PDMP not reviewed this encounter.       Pharmacotherapy: No side-effects or adverse reactions reported. Compliance: No problems identified. Effectiveness: Clinically acceptable.  No notes on file  No results found for: "CBDTHCR" No results found for: "D8THCCBX" No results found for: "D9THCCBX"  UDS:  Summary  Date Value Ref Range Status  11/21/2021 Note  Final    Comment:    ==================================================================== [employer] (MW) ==================================================================== Test                             Result       Flag       Units  Drug Present and Declared for Prescription Verification   Oxycodone                      >2433        EXPECTED   ng/mg creat   Oxymorphone                    164  EXPECTED   ng/mg creat   Noroxycodone                   >2433        EXPECTED   ng/mg creat   Noroxymorphone                 58           EXPECTED   ng/mg creat    Sources of oxycodone are scheduled prescription medications.    Oxymorphone, noroxycodone, and noroxymorphone are expected    metabolites of oxycodone. Oxymorphone is also available as a    scheduled prescription  medication.  Drug Present not Declared for Prescription Verification   Methamphetamine                12           UNEXPECTED ng/mg creat    Sources of methamphetamine include illicit sources, as a scheduled    prescription medication, as a metabolite of some prescription drugs,    or use of an l-methamphetamine inhaler.    Oxazepam                       17           UNEXPECTED ng/mg creat    Oxazepam may be administered as a scheduled prescription medication;    it is also an expected metabolite of other benzodiazepine drugs,    including diazepam, chlordiazepoxide, prazepam, clorazepate,    halazepam, and temazepam.    Benzoylecgonine                38           UNEXPECTED ng/mg creat    Benzoylecgonine is a metabolite of cocaine; its presence indicates    use of this drug.  Source is most commonly illicit, but cocaine is    present in some topical anesthetic solutions.  ==================================================================== Test                      Result    Flag   Units      Ref Range   Creatinine              411              mg/dL      >=20 ==================================================================== Declared Medications:  The flagging and interpretation on this report are based on the  following declared medications.  Unexpected results may arise from  inaccuracies in the declared medications.   **Note: The testing scope of this panel includes these medications:   Oxycodone   **Note: The testing scope of this panel does not include the  following reported medications:   Albuterol  Azelastine  Cetirizine  Citalopram (Celexa)  Diclofenac (Voltaren)  Docusate (Colace)  Fluticasone (Trelegy)  Guaifenesin (Mucinex)  Levothyroxine (Synthroid)  Naloxone  Nystatin  Supplement  Tizanidine (Zanaflex)  Umeclidinium (Trelegy)  Vilanterol (Trelegy)  Vitamin D ==================================================================== For clinical  consultation, please call [phone]. ====================================================================       ROS  Constitutional: Denies any fever or chills Gastrointestinal: No reported hemesis, hematochezia, vomiting, or acute GI distress Musculoskeletal: Denies any acute onset joint swelling, redness, loss of ROM, or weakness Neurological: No reported episodes of acute onset apraxia, aphasia, dysarthria, agnosia, amnesia, paralysis, loss of coordination, or loss of consciousness  Medication Review  Azelastine HCl, albuterol, budesonide-formoterol, cetirizine, cholecalciferol, citalopram, cyclobenzaprine, diclofenac Sodium, docusate sodium, levothyroxine,  magnesium hydroxide, and tiotropium  History Review  Allergy: Ms. Peine is allergic to aripiprazole, duloxetine, gabapentin, gold, iodinated contrast media, naproxen sodium, nortriptyline hcl, nsaids, pregabalin, meperidine, ace inhibitors, aspirin, cefpodoxime, fluoxetine, fluticasone-salmeterol, levofloxacin, lithium, paroxetine, tape, telithromycin, theophylline, topiramate, trazodone, triamcinolone, venlafaxine, bupropion, ketorolac tromethamine, morphine, and moxifloxacin. Drug: Ms. Gajda  reports no history of drug use. Alcohol:  reports no history of alcohol use. Tobacco:  reports that she has been smoking cigarettes. She has a 80.00 pack-year smoking history. She has never used smokeless tobacco. Social: Ms. Thedford  reports that she has been smoking cigarettes. She has a 80.00 pack-year smoking history. She has never used smokeless tobacco. She reports that she does not drink alcohol and does not use drugs. Medical:  has a past medical history of Acute postoperative pain (01/13/2017), Anxiety, Arthritis, Asthma, Bell's palsy, Bursitis, COPD (chronic obstructive pulmonary disease) ([place]), Depression, Fibromyalgia, Heart murmur, Hepatitis C, Hiatal hernia, Hyperlipidemia, Hypertension, IBS (irritable bowel syndrome), Insomnia,  and Thyroid disease. Surgical: Ms. Aispuro  has a past surgical history that includes necksurgery (10/21/2014); Back surgery (2013); Foot surgery (Right); Elbow surgery (Left); Carpal tunnel release (Right); Nose surgery; Partial hysterectomy; Total shoulder replacement (Bilateral); and Colonoscopy with propofol (N/A, 02/05/2017). Family: family history includes Cancer in her mother; Gout in her mother; Heart disease in her father.  Laboratory Chemistry Profile   Renal Lab Results  Component Value Date   BUN 14 04/28/2019   CREATININE 0.98 04/28/2019   BCR 13 02/16/2019   GFRAA >60 04/28/2019   GFRNONAA >60 04/28/2019    Hepatic Lab Results  Component Value Date   AST 18 04/28/2019   ALT 16 04/28/2019   ALBUMIN 3.7 04/28/2019   ALKPHOS 80 04/28/2019    Electrolytes Lab Results  Component Value Date   NA 139 04/28/2019   K 4.1 04/28/2019   CL 105 04/28/2019   CALCIUM 9.3 04/28/2019   MG 2.1 02/16/2019    Bone Lab Results  Component Value Date   25OHVITD1 27 (L) 02/16/2019   25OHVITD2 2.0 02/16/2019   25OHVITD3 25 02/16/2019    Inflammation (CRP: Acute Phase) (ESR: Chronic Phase) Lab Results  Component Value Date   CRP 2 02/16/2019   ESRSEDRATE 23 02/16/2019         Note: Above Lab results reviewed.  Recent Imaging Review  DG PAIN CLINIC C-ARM 1-60 MIN NO REPORT Fluoro was used, but no Radiologist interpretation will be provided.  Please refer to "NOTES" tab for provider progress note. Note: Reviewed        Physical Exam  General appearance: Well nourished, well developed, and well hydrated. In no apparent acute distress Mental status: Alert, oriented x 3 (person, place, & time)       Respiratory: No evidence of acute respiratory distress Eyes: PERLA Vitals: There were no vitals taken for this visit. BMI: Estimated body mass index is 38.27 kg/m as calculated from the following:   Height as of 09/17/22: 5\' 5"  (1.651 m).   Weight as of 09/17/22: 230 lb (104.3  kg). Ideal: Patient weight not recorded  Assessment   Diagnosis Status  No diagnosis found. Controlled Controlled Controlled   Updated Problems: No problems updated.  Plan of Care  Problem-specific:  No problem-specific Assessment & Plan notes found for this encounter.  Ms. KWEEN ANGRISANI has a current medication list which includes the following long-term medication(s): albuterol, azelastine hcl, citalopram, diclofenac sodium, levothyroxine, and symbicort.  Pharmacotherapy (Medications Ordered): No orders of the defined types were  placed in this encounter.  Orders:  No orders of the defined types were placed in this encounter.  Follow-up plan:   No follow-ups on file.      Interventional Therapies  Risk Factors  Considerations:   Allergy (Anaphylactic-type): Contrast dye.  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 (+) COCAINE.   Planned  Pending:   Therapeutic bilateral lumbar facet MBB #4 + (R) IA Hip #6 + TBI #4    Under consideration:   Diagnostic caudal ESI + epidurogram  Diagnostic right CESI  Diagnostic/therapeutic right cervical facet MBB #2 Diagnostic left SI Blk    Completed:   Therapeutic bilateral IA hip R5L6 + (B) Trochanteric bursae inj. R3L4 (05/09/2022) (100/100/85/85)  Palliative left trochanteric bursa x2 (09/18/2021) (100/100/90)  Palliative right trochanteric bursa x1 (01/25/2021) (100/100/90)  Palliative right suprascapular NB x3 (08/08/2020) (50/50/100/90-100)   Palliative left suprascapular NB x4 (02/22/2021) (100/100/90/90)  Palliative bilateral suprascapular nerve RFA x1 (05/13/2019) (100/100/90/90-100)  Therapeutic right IA hip joint injection x3 (06/01/2020) (100/100/100/90-100)  Therapeutic left IA hip joint injection x4 (09/18/2021) (100/100/90)  Therapeutic bilateral IA hip joint inj. x1 (12/03/2021) (by Dr.  Gillis Santa) (100/100/90)  Diagnostic bilateral lumbar facet MBB x3 (09/30/2017) (100/75/60)  Palliative right lumbar facet RFA x2 (12/16/2017) (100/100/85)  Palliative left lumbar facet RFA x2 (02/12/2018) [facility])  Diagnostic right cervical facet MBB x2 (04/11/2022) (100/100/100/100)  Diagnostic left cervical facet MBB x1 (09/30/2016) (90/100/0)  Diagnostic right SI joint block x1 (06/12/2017) (DNKFU -0/0/80)    Therapeutic  Palliative (PRN) options:   Therapeutic trochanteric bursa   Therapeutic suprascapular NB   Therapeutic suprascapular nerve RFA   Therapeutic IA hip joint injection   Therapeutic lumbar facet MBB   Therapeutic lumbar facet RFA   Therapeutic SI Blk     Pharmacotherapy  Nonopioids transferred 05/15/2020: Zanaflex and Voltaren gel        Recent Visits Date Type Provider Dept  09/17/22 Procedure visit Milinda Pointer, [facility]  08/29/22 Office Visit Milinda Pointer, MD [facility]  Showing recent visits within past 90 days and meeting all other requirements Future Appointments Date Type Provider Dept  10/10/22 Appointment Milinda Pointer, MD [facility]  Showing future appointments within next 90 days and meeting all other requirements  I discussed the assessment and treatment plan with the patient. The patient was provided an opportunity to ask questions and all were answered. The patient agreed with the plan and demonstrated an understanding of the instructions.  Patient advised to call back or seek an in-person evaluation if the symptoms or condition worsens.  Duration of encounter: *** minutes.  Total time on encounter, as per AMA guidelines included both the face-to-face and non-face-to-face time personally spent by the physician and/or other qualified health care professional(s) on the day of the encounter (includes time in activities that require the physician or other qualified health care professional and does  not include time in activities normally performed by clinical staff). Physician's time may include the following activities when performed: Preparing to see the patient (e.g., pre-charting review of records, searching for previously ordered imaging, lab work, and nerve conduction tests) Review of prior analgesic pharmacotherapies. Reviewing PMP Interpreting ordered tests (e.g., lab work, imaging, nerve conduction tests) Performing post-procedure evaluations, including interpretation of diagnostic procedures Obtaining and/or reviewing separately obtained history Performing a medically appropriate examination and/or evaluation Counseling  and educating the patient/family/caregiver Ordering medications, tests, or procedures Referring and communicating with other health care professionals (when not separately reported) Documenting clinical information in the electronic or other health record Independently interpreting results (not separately reported) and communicating results to the patient/ family/caregiver Care coordination (not separately reported)  Note by: Gaspar Cola, MD Date: 10/10/2022; Time: 12:30 PM

## 2022-10-10 ENCOUNTER — Ambulatory Visit: Payer: Medicaid Other | Attending: Pain Medicine | Admitting: Pain Medicine

## 2022-10-10 ENCOUNTER — Encounter: Payer: Self-pay | Admitting: Pain Medicine

## 2022-10-10 VITALS — BP 146/83 | HR 82 | Temp 97.7°F | Resp 18 | Ht 65.0 in | Wt 235.0 lb

## 2022-10-10 DIAGNOSIS — M5459 Other low back pain: Secondary | ICD-10-CM

## 2022-10-10 DIAGNOSIS — M25551 Pain in right hip: Secondary | ICD-10-CM

## 2022-10-10 DIAGNOSIS — Z96619 Presence of unspecified artificial shoulder joint: Secondary | ICD-10-CM

## 2022-10-10 DIAGNOSIS — M25512 Pain in left shoulder: Secondary | ICD-10-CM

## 2022-10-10 DIAGNOSIS — M25519 Pain in unspecified shoulder: Secondary | ICD-10-CM

## 2022-10-10 DIAGNOSIS — G8929 Other chronic pain: Secondary | ICD-10-CM | POA: Diagnosis present

## 2022-10-10 DIAGNOSIS — M545 Low back pain, unspecified: Secondary | ICD-10-CM

## 2022-10-10 DIAGNOSIS — M47816 Spondylosis without myelopathy or radiculopathy, lumbar region: Secondary | ICD-10-CM | POA: Diagnosis present

## 2022-10-10 NOTE — Patient Instructions (Addendum)
Radiofrequency Ablation Radiofrequency ablation is a procedure that is performed to relieve pain. The procedure is often used for back, neck, or arm pain. Radiofrequency ablation involves the use of a machine that creates radio waves to make heat. During the procedure, the heat is applied to the nerve that carries the pain signal. The heat damages the nerve and interferes with the pain signal. Pain relief usually starts about 2 weeks after the procedure and lasts for 6 months to 1 year. Tell a health care provider about: Any allergies you have. All medicines you are taking, including vitamins, herbs, eye drops, creams, and over-the-counter medicines. Any problems you or family members have had with anesthetic medicines. Any bleeding problems you have. Any surgeries you have had. Any medical conditions you have. Whether you are pregnant or may be pregnant. What are the risks? Generally, this is a safe procedure. However, problems may occur, including: Pain or soreness at the injection site. Allergic reaction to medicines given during the procedure. Bleeding. Infection at the injection site. Damage to nerves or blood vessels. What happens before the procedure? When to stop eating and drinking Follow instructions from your health care provider about what you may eat and drink before your procedure. These may include: 8 hours before the procedure Stop eating most foods. Do not eat meat, fried foods, or fatty foods. Eat only light foods, such as toast or crackers. All liquids are okay except energy drinks and alcohol. 6 hours before the procedure Stop eating. Drink only clear liquids, such as water, clear fruit juice, black coffee, plain tea, and sports drinks. Do not drink energy drinks or alcohol. 2 hours before the procedure Stop drinking all liquids. You may be allowed to take medicine with small sips of water. If you do not follow your health care provider's instructions, your  procedure may be delayed or canceled. Medicines Ask your health care provider about: Changing or stopping your regular medicines. This is especially important if you are taking diabetes medicines or blood thinners. Taking medicines such as aspirin and ibuprofen. These medicines can thin your blood. Do not take these medicines unless your health care provider tells you to take them. Taking over-the-counter medicines, vitamins, herbs, and supplements. General instructions Ask your health care provider what steps will be taken to help prevent infection. These steps may include: Removing hair at the procedure site. Washing skin with a germ-killing soap. Taking antibiotic medicine. If you will be going home right after the procedure, plan to have a responsible adult: Take you home from the hospital or clinic. You will not be allowed to drive. Care for you for the time you are told. What happens during the procedure?  You will be awake during the procedure. You will need to be able to talk with the health care provider during the procedure. An IV will be inserted into one of your veins. You will be given one or more of the following: A medicine to help you relax (sedative). A medicine to numb the area (local anesthetic). Your health care provider will insert a radiofrequency needle into the area to be treated. This is done with the help of fluoroscopy. A wire that carries the radio waves (electrode) will be put through the radiofrequency needle. An electrical pulse will be sent through the electrode to verify the correct nerve that is causing your pain. You will feel a tingling sensation, and you may have muscle twitching. The tissue around the needle tip will be heated by an  electric current that comes from the radiofrequency machine. This will numb the nerves. The needle will be removed. A bandage (dressing) will be put on the insertion area. The procedure may vary among health care providers  and hospitals. What happens after the procedure? Your blood pressure, heart rate, breathing rate, and blood oxygen level will be monitored until you leave the hospital or clinic. Return to your normal activities as told by your health care provider. Ask your health care provider what activities are safe for you. If you were given a sedative during the procedure, it can affect you for several hours. Do not drive or operate machinery until your health care provider says that it is safe. Summary Radiofrequency ablation is a procedure that is performed to relieve pain. The procedure is often used for back, neck, or arm pain. Radiofrequency ablation involves the use of a machine that creates radio waves to make heat. Plan to have a responsible adult take you home from the hospital or clinic. Do not drive or operate machinery until your health care provider says that it is safe. Return to your normal activities as told by your health care provider. Ask your health care provider what activities are safe for you. This information is not intended to replace advice given to you by your health care provider. Make sure you discuss any questions you have with your health care provider. Document Revised: 12/19/2020 Document Reviewed: 12/19/2020 Else[employer] Patient Education  [employer].  ______________________________________________________________________  Procedure instructions  Do not eat or drink fluids (other than water) for 6 hours before your procedure  No water for 2 hours before your procedure  Take your blood pressure medicine with a sip of water  Arrive 30 minutes before your appointment  Carefully read the "Preparing for your procedure" detailed instructions  If you have questions call us at [phone]  _____________________________________________________________________    ______________________________________________________________________  Preparing for your  procedure  Appointments: If you think you may not be able to keep your appointment, call 24-48 hours in advance to cancel. We need time to make it available to others.  During your procedure appointment there will be: No Prescription Refills. No disability issues to discussed. No medication changes or discussions.  Instructions: Food intake: Avoid eating anything solid for at least 8 hours prior to your procedure. Clear liquid intake: You may take clear liquids such as water up to 2 hours prior to your procedure. (No carbonated drinks. No soda.) Transportation: Unless otherwise stated by your physician, bring a driver. Morning Medicines: Except for blood thinners, take all of your other morning medications with a sip of water. Make sure to take your heart and blood pressure medicines. If your blood pressure's lower number is above 100, the case will be rescheduled. Blood thinners: Make sure to stop your blood thinners as instructed.  If you take a blood thinner, but were not instructed to stop it, call our office [phone] and ask to talk to a nurse. Not stopping a blood thinner prior to certain procedures could lead to serious complications. Diabetics on insulin: Notify the staff so that you can be scheduled 1st case in the morning. If your diabetes requires high dose insulin, take only  of your normal insulin dose the morning of the procedure and notify the staff that you have done so. Preventing infections: Shower with an antibacterial soap the morning of your procedure.  Build-up your immune system: Take 1000 mg of Vitamin C with every meal (  3 times a day) the day prior to your procedure. Antibiotics: Inform the nursing staff if you are taking any antibiotics or if you have any conditions that may require antibiotics prior to procedures. (Example: recent joint implants)   Pregnancy: If you are pregnant make sure to notify the nursing staff. Not doing so may result in injury to the  fetus, including death.  Sickness: If you have a cold, fever, or any active infections, call and cancel or reschedule your procedure. Receiving steroids while having an infection may result in complications. Arrival: You must be in the facility at least 30 minutes prior to your scheduled procedure. Tardiness: Your scheduled time is also the cutoff time. If you do not arrive at least 15 minutes prior to your procedure, you will be rescheduled.  Children: Do not bring any children with you. Make arrangements to keep them home. Dress appropriately: There is always a possibility that your clothing may get soiled. Avoid long dresses. Valuables: Do not bring any jewelry or valuables.  Reasons to call and reschedule or cancel your procedure: (Following these recommendations will minimize the risk of a serious complication.) Surgeries: Avoid having procedures within 2 weeks of any surgery. (Avoid for 2 weeks before or after any surgery). Flu Shots: Avoid having procedures within 2 weeks of a flu shots or . (Avoid for 2 weeks before or after immunizations). Barium: Avoid having a procedure within 7-10 days after having had a radiological study involving the use of radiological contrast. (Myelograms, Barium swallow or enema study). Heart attacks: Avoid any elective procedures or surgeries for the initial 6 months after a "Myocardial Infarction" (Heart Attack). Blood thinners: It is imperative that you stop these medications before procedures. Let us know if you if you take any blood thinner.  Infection: Avoid procedures during or within two weeks of an infection (including chest colds or gastrointestinal problems). Symptoms associated with infections include: Localized redness, fever, chills, night sweats or profuse sweating, burning sensation when voiding, cough, congestion, stuffiness, runny nose, sore throat, diarrhea, nausea, vomiting, cold or Flu symptoms, recent or current infections. It is specially  important if the infection is over the area that we intend to treat. Heart and lung problems: Symptoms that may suggest an active cardiopulmonary problem include: cough, chest pain, breathing difficulties or shortness of breath, dizziness, ankle swelling, uncontrolled high or unusually low blood pressure, and/or palpitations. If you are experiencing any of these symptoms, cancel your procedure and contact your primary care physician for an evaluation.  Remember:  Regular Business hours are:  Monday to Thursday 8:00 AM to 4:00 PM  Provider's Schedule: Milinda Pointer, MD:  Procedure days: Tuesday and Thursday 7:30 AM to 4:00 PM  Gillis Santa, MD:  Procedure days: Monday and Wednesday 7:30 AM to 4:00 PM  ______________________________________________________________________    ____________________________________________________________________________________________  General Risks and Possible Complications  Patient Responsibilities: It is important that you read this as it is part of your informed consent. It is our duty to inform you of the risks and possible complications associated with treatments offered to you. It is your responsibility as a patient to read this and to ask questions about anything that is not clear or that you believe was not covered in this document.  Patient's Rights: You have the right to refuse treatment. You also have the right to change your mind, even after initially having agreed to have the treatment done. However, under this last option, if you wait until the last second to change  your mind, you may be charged for the materials used up to that point.  Introduction: Medicine is not an Chief Strategy Officer. Everything in Medicine, including the lack of treatment(s), carries the potential for danger, harm, or loss (which is by definition: Risk). In Medicine, a complication is a secondary problem, condition, or disease that can aggravate an already existing one. All  treatments carry the risk of possible complications. The fact that a side effects or complications occurs, does not imply that the treatment was conducted incorrectly. It must be clearly understood that these can happen even when everything is done following the highest safety standards.  No treatment: You can choose not to proceed with the proposed treatment alternative. The "PRO(s)" would include: avoiding the risk of complications associated with the therapy. The "CON(s)" would include: not getting any of the treatment benefits. These benefits fall under one of three categories: diagnostic; therapeutic; and/or palliative. Diagnostic benefits include: getting information which can ultimately lead to improvement of the disease or symptom(s). Therapeutic benefits are those associated with the successful treatment of the disease. Finally, palliative benefits are those related to the decrease of the primary symptoms, without necessarily curing the condition (example: decreasing the pain from a flare-up of a chronic condition, such as incurable terminal cancer).  General Risks and Complications: These are associated to most interventional treatments. They can occur alone, or in combination. They fall under one of the following six (6) categories: no benefit or worsening of symptoms; bleeding; infection; nerve damage; allergic reactions; and/or death. No benefits or worsening of symptoms: In Medicine there are no guarantees, only probabilities. No healthcare provider can ever guarantee that a medical treatment will work, they can only state the probability that it may. Furthermore, there is always the possibility that the condition may worsen, either directly, or indirectly, as a consequence of the treatment. Bleeding: This is more common if the patient is taking a blood thinner, either prescription or over the counter (example: Goody Powders, Fish oil, Aspirin, Garlic, etc.), or if suffering a condition  associated with impaired coagulation (example: Hemophilia, cirrhosis of the liver, low platelet counts, etc.). However, even if you do not have one on these, it can still happen. If you have any of these conditions, or take one of these drugs, make sure to notify your treating physician. Infection: This is more common in patients with a compromised immune system, either due to disease (example: diabetes, cancer, human immunodeficiency virus [HIV], etc.), or due to medications or treatments (example: therapies used to treat cancer and rheumatological diseases). However, even if you do not have one on these, it can still happen. If you have any of these conditions, or take one of these drugs, make sure to notify your treating physician. Nerve Damage: This is more common when the treatment is an invasive one, but it can also happen with the use of medications, such as those used in the treatment of cancer. The damage can occur to small secondary nerves, or to large primary ones, such as those in the spinal cord and brain. This damage may be temporary or permanent and it may lead to impairments that can range from temporary numbness to permanent paralysis and/or brain death. Allergic Reactions: Any time a substance or material comes in contact with our body, there is the possibility of an allergic reaction. These can range from a mild skin rash (contact dermatitis) to a severe systemic reaction (anaphylactic reaction), which can result in death. Death: In general, any medical  intervention can result in death, most of the time due to an unforeseen complication. ____________________________________________________________________________________________

## 2022-11-06 NOTE — Progress Notes (Signed)
PROVIDER NOTE: Interpretation of information contained herein should be left to medically-trained personnel. Specific patient instructions are provided elsewhere under "Patient Instructions" section of medical record. This document was created in part using STT-dictation technology, any transcriptional errors that may result from this process are unintentional.  Patient: Danielle [name] Type: Established DOB: [birth year] MRN: [id number] PCP: Jenell Milliner, MD  Service: Procedure DOS: 11/07/2022 Setting: Ambulatory Location: Ambulatory outpatient facility Delivery: Face-to-face Provider: Oswaldo Done, MD Specialty: Interventional Pain Management Specialty designation: 09 Location: Outpatient facility Ref. Prov.: Jenell Milliner, MD       Interventional Therapy   Procedure: Suprascapular nerve Radiofrequency Ablation (RFA) #2  Laterality: Left  Level: Superior to the scapular spine, in the lateral aspect of the supraspinatus fossa (Suprescapular notch).  Imaging: Fluoroscopic guidance Anesthesia: Local anesthesia (1-2% Lidocaine) Anxiolysis: None                Sedation: None.  DOS: 11/07/2022  Performed by: Oswaldo Done, MD  Purpose: Therapeutic Indications: Shoulder pain severe enough to impact quality of life or function. Indications: No diagnosis found. Ms. Riles has been dealing with the above chronic pain for longer than three months and has either failed to respond, was unable to tolerate, or simply did not get enough benefit from other more conservative therapies including, but not limited to: 1. Over-the-counter medications 2. Anti-inflammatory medications 3. Muscle relaxants 4. Membrane stabilizers 5. Opioids 6. Physical therapy and/or chiropractic manipulation 7. Modalities (Heat, ice, etc.) 8. Invasive techniques such as nerve blocks. Ms. Hott has attained more than 50% relief of the pain from a series of diagnostic injections conducted in separate  occasions.  Pain Score: Pre-procedure:  /10 Post-procedure:  /10    Position / Prep / Materials:  Position: Prone  Prep solution: DuraPrep (Iodine Povacrylex [0.7% available iodine] and Isopropyl Alcohol, 74% w/w) Prep Area: Entire shoulder area.  This includes the lateral and posterior aspect of the neck, lateral and posterior aspect of the upper third of the upper arm, from the axilla to the spine in the upper back, down to the lower border of the scapula. Materials:  Tray:  RFA (Radiofrequency) tray Needle(s):  Type: RFA (Teflon-coated radiofrequency ablation needles)  Gauge (G): 22  Length: Regular (10cm) Qty:    ***     Pre-op H&P Assessment:  Ms. Feider is a 62 y.o. (year old), female patient, seen today for interventional treatment. She  has a past surgical history that includes necksurgery (10/21/2014); Back surgery (2013); Foot surgery (Right); Elbow surgery (Left); Carpal tunnel release (Right); Nose surgery; Partial hysterectomy; Total shoulder replacement (Bilateral); and Colonoscopy with propofol (N/A, 02/05/2017). Ms. Gutt has a current medication list which includes the following prescription(s): albuterol, azelastine hcl, cetirizine, cholecalciferol, citalopram, cyclobenzaprine, diclofenac sodium, docusate sodium, levothyroxine, magnesium hydroxide, spiriva handihaler, and symbicort. Her primarily concern today is the No chief complaint on file.  Initial Vital Signs:  Pulse/HCG Rate:    Temp:   Resp:   BP:   SpO2:    BMI: Estimated body mass index is 39.11 kg/m as calculated from the following:   Height as of 10/10/22:  (1.651 m).   Weight as of 10/10/22: 235 lb (106.6 kg).  Risk Assessment: Allergies: Reviewed. She is allergic to aripiprazole, duloxetine, gabapentin, gold, iodinated contrast media, naproxen sodium, nortriptyline hcl, nsaids, pregabalin, meperidine, ace inhibitors, aspirin, cefpodoxime, fluoxetine, fluticasone-salmeterol, levofloxacin,  lithium, paroxetine, tape, telithromycin, theophylline, topiramate, trazodone, triamcinolone, venlafaxine, bupropion, ketorolac tromethamine, morphine, and moxifloxacin.  Allergy Precautions: None  required Coagulopathies: Reviewed. None identified.  Blood-thinner therapy: None at this time Active Infection(s): Reviewed. None identified. Ms. Nodal is afebrile  Site Confirmation: Ms. Hoar was asked to confirm the procedure and laterality before marking the site Procedure checklist: Completed Consent: Before the procedure and under the influence of no sedative(s), amnesic(s), or anxiolytics, the patient was informed of the treatment options, risks and possible complications. To fulfill our ethical and legal obligations, as recommended by the American Medical Association's Code of Ethics, I have informed the patient of my clinical impression; the nature and purpose of the treatment or procedure; the risks, benefits, and possible complications of the intervention; the alternatives, including doing nothing; the risk(s) and benefit(s) of the alternative treatment(s) or procedure(s); and the risk(s) and benefit(s) of doing nothing. The patient was provided information about the general risks and possible complications associated with the procedure. These may include, but are not limited to: failure to achieve desired goals, infection, bleeding, organ or nerve damage, allergic reactions, paralysis, and death. In addition, the patient was informed of those risks and complications associated to the procedure, such as failure to decrease pain; infection; bleeding; organ or nerve damage with subsequent damage to sensory, motor, and/or autonomic systems, resulting in permanent pain, numbness, and/or weakness of one or several areas of the body; allergic reactions; (i.e.: anaphylactic reaction); and/or death. Furthermore, the patient was informed of those risks and complications associated with the medications.  These include, but are not limited to: allergic reactions (i.e.: anaphylactic or anaphylactoid reaction(s)); adrenal axis suppression; blood sugar elevation that in diabetics may result in ketoacidosis or comma; water retention that in patients with history of congestive heart failure may result in shortness of breath, pulmonary edema, and decompensation with resultant heart failure; weight gain; swelling or edema; medication-induced neural toxicity; particulate matter embolism and blood vessel occlusion with resultant organ, and/or nervous system infarction; and/or aseptic necrosis of one or more joints. Finally, the patient was informed that Medicine is not an exact science; therefore, there is also the possibility of unforeseen or unpredictable risks and/or possible complications that may result in a catastrophic outcome. The patient indicated having understood very clearly. We have given the patient no guarantees and we have made no promises. Enough time was given to the patient to ask questions, all of which were answered to the patient's satisfaction. Ms. Mote has indicated that she wanted to continue with the procedure. Attestation: I, the ordering provider, attest that I have discussed with the patient the benefits, risks, side-effects, alternatives, likelihood of achieving goals, and potential problems during recovery for the procedure that I have provided informed consent. Date  Time: {CHL [facility] TIME CHOICES:21018001}  Pre-Procedure Preparation:  Monitoring: As per clinic protocol. Respiration, ETCO2, SpO2, BP, heart rate and rhythm monitor placed and checked for adequate function Safety Precautions: Patient was assessed for positional comfort and pressure points before starting the procedure. Time-out: I initiated and conducted the "Time-out" before starting the procedure, as per protocol. The patient was asked to participate by confirming the accuracy of the "Time Out" information.  Verification of the correct person, site, and procedure were performed and confirmed by me, the nursing staff, and the patient. "Time-out" conducted as per Joint Commission's Universal Protocol (UP.01.01.01). Time:   Start Time:   hrs.  Description/Narrative of Procedure:          Target: Suprascapular nerve as it passes thru the lower portion of the suprascapular notch. Location: Suprascapular notch Region: Shoulder, suprascapular Approach: Percutaneous  Rationale (medical necessity): procedure needed and proper for the diagnosis and/or treatment of the patient's medical symptoms and needs. Procedural Technique Safety Precautions: Aspiration looking for blood return was conducted prior to all injections. At no point did we inject any substances, as a needle was being advanced. No attempts were made at seeking any paresthesias. Safe injection practices and needle disposal techniques used. Medications properly checked for expiration dates. SDV (single dose vial) medications used. Description of the Procedure: Protocol guidelines were followed. The patient was placed in position over the procedure table. The target area was identified and the area prepped in the usual manner. The skin and muscle were infiltrated with local anesthetic. Appropriate amount of time allowed to pass for local anesthetics to take effect. Radiofrequency needles were introduced to the target area using fluoroscopic guidance. Using the NeuroTherm NT1100 Radiofrequency Generator, sensory stimulation using 50 Hz was used to locate & identify the nerve, making sure that the needle was positioned such that there was no sensory stimulation below 0.3 V or above 0.7 V. Stimulation using 2 Hz was used to evaluate the motor component. Care was taken not to lesion any nerves that demonstrated motor stimulation of the lower extremities at an output of less than 2.5 times that of the sensory threshold, or a maximum of 2.0 V. Once satisfactory  placement of the needles was achieved, the numbing solution was slowly injected after negative aspiration. After waiting for at least 2 minutes, the ablation was performed at 80 degrees C for 60 seconds, using regular Radiofrequency settings. Once the procedure was completed, the needles were then removed and the area cleansed, making sure to leave some of the prepping solution back to take advantage of its long term bactericidal properties. Intra-operative Compliance: Compliant   There were no vitals filed for this visit.  Start Time:   hrs. End Time:   hrs.  Materials & Medications:  Needle(s) Type: Teflon-coated, curved tip, Radiofrequency needle(s) Gauge: 22G Length: 10cm Medication(s): Please see orders for medications and dosing details.  Imaging Guidance (Non-Spinal):          Type of Imaging Technique: Fluoroscopy Guidance (Non-Spinal) Indication(s): Assistance in needle guidance and placement for procedures requiring needle placement in or near specific anatomical locations not easily accessible without such assistance. Exposure Time: Please see nurses notes. Contrast: Before injecting any contrast, we confirmed that the patient did not have an allergy to iodine, shellfish, or radiological contrast. Once satisfactory needle placement was completed at the desired level, radiological contrast was injected. Contrast injected under live fluoroscopy. No contrast complications. See chart for type and volume of contrast used. Fluoroscopic Guidance: I was personally present during the use of fluoroscopy. "Tunnel Vision Technique" used to obtain the best possible view of the target area. Parallax error corrected before commencing the procedure. "Direction-depth-direction" technique used to introduce the needle under continuous pulsed fluoroscopy. Once target was reached, antero-posterior, oblique, and lateral fluoroscopic projection used confirm needle placement in all planes. Images permanently  stored in EMR. Interpretation: I personally interpreted the imaging intraoperatively. Adequate needle placement confirmed in multiple planes. Appropriate spread of contrast into desired area was observed. No evidence of afferent or efferent intravascular uptake. Permanent images saved into the patient's record.  Antibiotic Prophylaxis:   Anti-infectives (From admission, onward)    None      Indication(s): None identified  Post-operative Assessment:  Post-procedure Vital Signs:  Pulse/HCG Rate:    Temp:   Resp:   BP:   SpO2:  EBL: None  Complications: No immediate post-treatment complications observed by team, or reported by patient.  Note: The patient tolerated the entire procedure well. A repeat set of vitals were taken after the procedure and the patient was kept under observation following institutional policy, for this type of procedure. Post-procedural neurological assessment was performed, showing return to baseline, prior to discharge. The patient was provided with post-procedure discharge instructions, including a section on how to identify potential problems. Should any problems arise concerning this procedure, the patient was given instructions to immediately contact us, at any time, without hesitation. In any case, we plan to contact the patient by telephone for a follow-up status report regarding this interventional procedure.  Comments:  No additional relevant information.  Plan of Care (POC)  Orders:  No orders of the defined types were placed in this encounter.  Chronic Opioid Analgesic:  No chronic opioid analgesics therapy prescribed by our practice.  Discontinued (11/21/2021) UDS. (+) COCAINE.   Medications ordered for procedure: No orders of the defined types were placed in this encounter.  Medications administered: Mariadel A. Neale Burly "ANGIE" had no medications administered during this visit.  See the medical record for exact dosing, route, and time of  administration.  Follow-up plan:   No follow-ups on file.       Interventional Therapies  Risk Factors  Considerations:   Allergy (Anaphylactic-type): Contrast dye.  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 (+) COCAINE.   Planned  Pending:   Therapeutic left suprascapular nerve RFA #2    Under consideration:   Diagnostic caudal ESI + epidurogram  Diagnostic right CESI  Diagnostic/therapeutic right cervical facet MBB #2 Diagnostic left SI Blk    Completed:   Palliative left trochanteric bursa x2 (09/18/2021) (100/100/90)  Palliative right trochanteric bursa x4 (09/17/2022) (100/100/90/70)  Palliative right suprascapular NB x3 (08/08/2020) (50/50/100/90-100)   Palliative left suprascapular NB x4 (02/22/2021) (100/100/90/90)  Palliative bilateral suprascapular nerve RFA x1 (05/13/2019) (100/100/90/90-100)  Therapeutic right IA hip joint injection x6 (09/17/2022) (100/100/70/70)  Therapeutic left IA hip joint injection x4 (09/18/2021) (100/100/90)  Diagnostic bilateral lumbar facet MBB x4 (09/17/2022) (100/100/70/70)  Palliative right lumbar facet RFA x2 (12/16/2017) (100/100/85)  Palliative left lumbar facet RFA x2 (02/12/2018) [facility])  Diagnostic right cervical facet MBB x2 (04/11/2022) (100/100/100/100)  Diagnostic left cervical facet MBB x1 (09/30/2016) (90/100/0)  Diagnostic right SI joint block x1 (06/12/2017) (DNKFU -0/0/80)    Therapeutic  Palliative (PRN) options:   Therapeutic trochanteric bursa   Therapeutic suprascapular NB   Therapeutic suprascapular nerve RFA   Therapeutic IA hip joint injection   Therapeutic lumbar facet MBB   Therapeutic lumbar facet RFA   Therapeutic SI Blk     Pharmacotherapy  Nonopioids transferred 05/15/2020: Zanaflex and Voltaren gel        Recent Visits Date Type Provider Dept  10/10/22 Office Visit Delano Metz, MD [facility]  09/17/22 Procedure visit Delano Metz, MD [facility]  08/29/22 Office Visit Delano Metz, MD [facility]  Showing recent visits within past 90 days and meeting all other requirements Future Appointments Date Type Provider Dept  11/07/22 Appointment Delano Metz, MD [facility]  Showing future appointments within next 90 days and meeting all other requirements  Disposition: Discharge home  Discharge (Date  Time): 11/07/2022;   hrs.   Primary Care Physician: Jenell Milliner, MD Location:  [facility] Note by: Oswaldo Done, MD (TTS technology used. I apologize for any typographical errors that were not detected and corrected.) Date: 11/07/2022; Time: 8:13 AM  Disclaimer:  Medicine is not an Visual merchandiser. The only guarantee in medicine is that nothing is guaranteed. It is important to note that the decision to proceed with this intervention was based on the information collected from the patient. The Data and conclusions were drawn from the patient's questionnaire, the interview, and the physical examination. Because the information was provided in large part by the patient, it cannot be guaranteed that it has not been purposely or unconsciously manipulated. Every effort has been made to obtain as much relevant data as possible for this evaluation. It is important to note that the conclusions that lead to this procedure are derived in large part from the available data. Always take into account that the treatment will also be dependent on availability of resources and existing treatment guidelines, considered by other Pain Management Practitioners as being common knowledge and practice, at the time of the intervention. For Medico-Legal purposes, it is also important to point out that variation in procedural techniques and pharmacological choices are the acceptable norm. The  indications, contraindications, technique, and results of the above procedure should only be interpreted and judged by a Board-Certified Interventional Pain Specialist with extensive familiarity and expertise in the same exact procedure and technique.

## 2022-11-07 ENCOUNTER — Ambulatory Visit
Admission: RE | Admit: 2022-11-07 | Discharge: 2022-11-07 | Disposition: A | Discharge status: Home / Self Care | Payer: Medicaid Other | Source: Ambulatory Visit | Attending: Pain Medicine | Admitting: Pain Medicine

## 2022-11-07 ENCOUNTER — Encounter: Payer: Self-pay | Admitting: Pain Medicine

## 2022-11-07 ENCOUNTER — Ambulatory Visit: Payer: Medicaid Other | Attending: Pain Medicine | Admitting: Pain Medicine

## 2022-11-07 VITALS — BP 122/77 | HR 93 | Temp 97.9°F | Resp 16 | Ht 65.0 in | Wt 240.0 lb

## 2022-11-07 DIAGNOSIS — Z96612 Presence of left artificial shoulder joint: Secondary | ICD-10-CM | POA: Diagnosis present

## 2022-11-07 DIAGNOSIS — Z96611 Presence of right artificial shoulder joint: Secondary | ICD-10-CM | POA: Diagnosis present

## 2022-11-07 DIAGNOSIS — G8929 Other chronic pain: Secondary | ICD-10-CM | POA: Insufficient documentation

## 2022-11-07 DIAGNOSIS — M19012 Primary osteoarthritis, left shoulder: Secondary | ICD-10-CM | POA: Insufficient documentation

## 2022-11-07 DIAGNOSIS — M25512 Pain in left shoulder: Secondary | ICD-10-CM | POA: Diagnosis not present

## 2022-11-07 DIAGNOSIS — G8918 Other acute postprocedural pain: Secondary | ICD-10-CM | POA: Insufficient documentation

## 2022-11-07 DIAGNOSIS — Z96619 Presence of unspecified artificial shoulder joint: Secondary | ICD-10-CM | POA: Diagnosis present

## 2022-11-07 DIAGNOSIS — M19011 Primary osteoarthritis, right shoulder: Secondary | ICD-10-CM | POA: Diagnosis present

## 2022-11-07 DIAGNOSIS — M25519 Pain in unspecified shoulder: Secondary | ICD-10-CM | POA: Diagnosis present

## 2022-11-07 MED ORDER — LACTATED RINGERS IV SOLN: Freq: Once | INTRAVENOUS | Status: AC

## 2022-11-07 MED ORDER — ROPIVACAINE HCL 2 MG/ML IJ SOLN
4.0000 mL | Freq: Once | INTRAMUSCULAR | Status: AC
  Administered 2022-11-07: 4 mL via PERINEURAL

## 2022-11-07 MED ORDER — OXYCODONE-ACETAMINOPHEN 5-325 MG PO TABS
1.0000 | ORAL_TABLET | Freq: Three times a day (TID) | ORAL | 0 refills | Status: DC | PRN
Start: 2022-11-14 — End: 2022-11-18

## 2022-11-07 MED ORDER — FENTANYL CITRATE (PF) 100 MCG/2ML IJ SOLN
25.0000 ug | INTRAMUSCULAR | Status: DC | PRN
  Administered 2022-11-07: 50 ug via INTRAVENOUS

## 2022-11-07 MED ORDER — LIDOCAINE HCL (PF) 2 % IJ SOLN
INTRAMUSCULAR | Status: AC
  Filled 2022-11-07: qty 10

## 2022-11-07 MED ORDER — MIDAZOLAM HCL 5 MG/5ML IJ SOLN
INTRAMUSCULAR | Status: AC
  Filled 2022-11-07: qty 5

## 2022-11-07 MED ORDER — FENTANYL CITRATE (PF) 100 MCG/2ML IJ SOLN
INTRAMUSCULAR | Status: AC
  Filled 2022-11-07: qty 2

## 2022-11-07 MED ORDER — PENTAFLUOROPROP-TETRAFLUOROETH EX AERO
INHALATION_SPRAY | Freq: Once | CUTANEOUS | Status: AC
  Administered 2022-11-07: 30 via TOPICAL
  Filled 2022-11-07: qty 116

## 2022-11-07 MED ORDER — TRIAMCINOLONE ACETONIDE 40 MG/ML IJ SUSP
INTRAMUSCULAR | Status: AC
  Filled 2022-11-07: qty 1

## 2022-11-07 MED ORDER — TRIAMCINOLONE ACETONIDE 40 MG/ML IJ SUSP
40.0000 mg | Freq: Once | INTRAMUSCULAR | Status: AC
  Administered 2022-11-07: 40 mg

## 2022-11-07 MED ORDER — LIDOCAINE HCL 2 % IJ SOLN
20.0000 mL | Freq: Once | INTRAMUSCULAR | Status: AC
  Administered 2022-11-07: 100 mg

## 2022-11-07 MED ORDER — OXYCODONE-ACETAMINOPHEN 5-325 MG PO TABS
1.0000 | ORAL_TABLET | Freq: Three times a day (TID) | ORAL | 0 refills | Status: AC | PRN
Start: 2022-11-07 — End: 2022-11-14

## 2022-11-07 MED ORDER — MIDAZOLAM HCL 5 MG/5ML IJ SOLN
0.5000 mg | Freq: Once | INTRAMUSCULAR | Status: AC
  Administered 2022-11-07: 3 mg via INTRAVENOUS

## 2022-11-07 NOTE — Patient Instructions (Signed)

## 2022-11-07 NOTE — Progress Notes (Signed)
Safety precautions to be maintained throughout the outpatient stay will include: orient to surroundings, keep bed in low position, maintain call bell within reach at all times, provide assistance with transfer out of bed and ambulation.  

## 2022-11-08 ENCOUNTER — Telehealth: Payer: Self-pay

## 2022-11-08 NOTE — Telephone Encounter (Signed)
Post procedure follow up.  Patient states she is doing ok.  

## 2022-11-18 ENCOUNTER — Other Ambulatory Visit: Payer: Self-pay

## 2022-11-18 ENCOUNTER — Telehealth: Payer: Self-pay | Admitting: Pain Medicine

## 2022-11-18 DIAGNOSIS — G8918 Other acute postprocedural pain: Secondary | ICD-10-CM

## 2022-11-18 MED ORDER — OXYCODONE-ACETAMINOPHEN 5-325 MG PO TABS
1.0000 | ORAL_TABLET | Freq: Three times a day (TID) | ORAL | 0 refills | Status: AC | PRN
Start: 2022-11-18 — End: 2022-11-25

## 2022-11-18 NOTE — Telephone Encounter (Signed)
Patient states she is supposed to have a script at the pharmacy but they are telling they do not have one for oxycodone. Please check and advise patient. This is from last procedure.

## 2022-11-18 NOTE — Telephone Encounter (Signed)
PT stated that the pharmacy stated they never got patient prescription for oxycodone. Please give patient a call. PT stated that she took last pill this morning at 8.

## 2022-11-18 NOTE — Telephone Encounter (Signed)
Confirmed with Luisa Hart at [facility] that they did not receive the prescription for Percocet.  Refill request sent to Dr Laban Emperor.

## 2022-11-18 NOTE — Telephone Encounter (Signed)
Pharmacy is closed until 2 pm.

## 2022-12-19 ENCOUNTER — Encounter: Payer: Self-pay | Admitting: Pain Medicine

## 2022-12-19 ENCOUNTER — Ambulatory Visit: Payer: Medicaid Other | Attending: Pain Medicine | Admitting: Pain Medicine

## 2022-12-19 VITALS — BP 127/91 | HR 90 | Temp 97.7°F | Resp 16 | Ht 65.0 in | Wt 250.0 lb

## 2022-12-19 DIAGNOSIS — G8929 Other chronic pain: Secondary | ICD-10-CM | POA: Diagnosis present

## 2022-12-19 DIAGNOSIS — M545 Low back pain, unspecified: Secondary | ICD-10-CM | POA: Insufficient documentation

## 2022-12-19 DIAGNOSIS — S73192S Other sprain of left hip, sequela: Secondary | ICD-10-CM | POA: Diagnosis present

## 2022-12-19 DIAGNOSIS — Z09 Encounter for follow-up examination after completed treatment for conditions other than malignant neoplasm: Secondary | ICD-10-CM | POA: Diagnosis present

## 2022-12-19 DIAGNOSIS — M25512 Pain in left shoulder: Secondary | ICD-10-CM | POA: Insufficient documentation

## 2022-12-19 DIAGNOSIS — Z96611 Presence of right artificial shoulder joint: Secondary | ICD-10-CM | POA: Diagnosis present

## 2022-12-19 DIAGNOSIS — M19011 Primary osteoarthritis, right shoulder: Secondary | ICD-10-CM | POA: Diagnosis present

## 2022-12-19 DIAGNOSIS — M25551 Pain in right hip: Secondary | ICD-10-CM | POA: Diagnosis present

## 2022-12-19 DIAGNOSIS — M19012 Primary osteoarthritis, left shoulder: Secondary | ICD-10-CM | POA: Insufficient documentation

## 2022-12-19 DIAGNOSIS — M25511 Pain in right shoulder: Secondary | ICD-10-CM | POA: Insufficient documentation

## 2022-12-19 DIAGNOSIS — M160 Bilateral primary osteoarthritis of hip: Secondary | ICD-10-CM | POA: Insufficient documentation

## 2022-12-19 DIAGNOSIS — R935 Abnormal findings on diagnostic imaging of other abdominal regions, including retroperitoneum: Secondary | ICD-10-CM | POA: Insufficient documentation

## 2022-12-19 DIAGNOSIS — Z96612 Presence of left artificial shoulder joint: Secondary | ICD-10-CM | POA: Insufficient documentation

## 2022-12-19 DIAGNOSIS — S73191S Other sprain of right hip, sequela: Secondary | ICD-10-CM | POA: Diagnosis present

## 2022-12-19 DIAGNOSIS — M25552 Pain in left hip: Secondary | ICD-10-CM | POA: Diagnosis present

## 2022-12-19 DIAGNOSIS — R937 Abnormal findings on diagnostic imaging of other parts of musculoskeletal system: Secondary | ICD-10-CM | POA: Diagnosis present

## 2022-12-19 MED ORDER — KETOROLAC TROMETHAMINE 60 MG/2ML IM SOLN
60.0000 mg | Freq: Once | INTRAMUSCULAR | Status: AC
  Administered 2022-12-19: 60 mg via INTRAMUSCULAR

## 2022-12-19 MED ORDER — METHOCARBAMOL 1000 MG/10ML IJ SOLN
200.0000 mg | Freq: Once | INTRAMUSCULAR | Status: AC
  Administered 2022-12-19: 200 mg via INTRAMUSCULAR
  Filled 2022-12-19: qty 10

## 2022-12-19 MED ORDER — KETOROLAC TROMETHAMINE 60 MG/2ML IM SOLN
INTRAMUSCULAR | Status: AC
  Filled 2022-12-19: qty 2

## 2022-12-19 MED ORDER — ORPHENADRINE CITRATE 30 MG/ML IJ SOLN
60.0000 mg | Freq: Once | INTRAMUSCULAR | Status: DC
  Filled 2022-12-19: qty 2

## 2022-12-19 NOTE — Progress Notes (Signed)
PROVIDER NOTE: Information contained herein reflects review and annotations entered in association with encounter. Interpretation of such information and data should be left to medically-trained personnel. Information provided to patient can be located elsewhere in the medical record under "Patient Instructions". Document created using STT-dictation technology, any transcriptional errors that may result from process are unintentional.    Patient: Danielle [name]  Service Category: E/M  Provider: Oswaldo Done, MD  DOB: 1961-02-05  DOS: 12/19/2022  Referring Provider: Jenell Milliner, MD  MRN: [id number]  Specialty: Interventional Pain Management  PCP: Jenell Milliner, MD  Type: Established Patient  Setting: Ambulatory outpatient    Location: Office  Delivery: Face-to-face     HPI  Danielle [name], a 62 y.o. year old female, is here today because of her Chronic left shoulder pain [M25.512, G89.29]. Ms. Arch primary complain today is Shoulder Pain (Left and right both are s/p joint replacement ) and Hip Pain (Right )  Pertinent problems: Ms. Legerski has Cervical spinal cord compression [facility]) (April, 2017); Cervical spinal stenosis; Chronic shoulder pain (1ry area of Pain) (Bilateral) (R>L); Chronic low back pain (3ry area of Pain) (Bilateral) (R>L) w/ sciatica (Right); Lumbar facet syndrome (Bilateral) (R>L); Lumbar spondylosis; Failed back surgical syndrome; Chronic lower extremity pain (Right); Chronic neck pain (Bilateral) (R>L); Chronic cervical radicular pain (Right); Ulnar neuropathy (Left); Hx of cervical spine surgery; Cervical spondylosis; Fibromyalgia; Chronic sacroiliac pain (Bilateral) (L>R); History of total arthroplasty of shoulder (Left); Chronic pain syndrome; Complaints of weakness of lower extremity; Anterolisthesis of lumbar spine (L4/L5); Cervical facet syndrome (HCC); Chronic musculoskeletal pain; Muscle spasm of back; Myofascial pain; Chronic hip pain (2ry area of Pain)  (Bilateral) (R>L); 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); Trigger point with back pain; Osteoarthritis of shoulder (Bilateral); Spondylosis without myelopathy or radiculopathy, lumbosacral region; Cellulitis of leg, right; Chronic trochanteric bursitis (Bilateral); Osteoarthritis involving multiple joints; Chronic shoulder pain after replacement; History of total shoulder replacement (Right); History of total replacement of shoulder joints (Bilateral); Leg edema, left; Spondylosis without myelopathy or radiculopathy, cervical region; Chronic hip pain (Right); Osteoarthritis of hip (Right); Chronic sacroiliac joint pain (Left); Other spondylosis, sacral and sacrococcygeal region; Sacroiliac joint dysfunction (Left); Somatic dysfunction of sacroiliac joint (Left); Enthesopathy of sacroiliac joint (Left); DDD (degenerative disc disease), cervical; DDD (degenerative disc disease), lumbosacral; Other intervertebral disc degeneration, lumbar region; Acetabular labrum tear, sequela (Left); Acetabular labrum tear, sequela (Right); Greater trochanteric bursitis of hips (Bilateral); Epidural fibrosis (Left: L5-S1); Chronic groin pain (Bilateral); Abnormal MRI, lumbar spine (09/28/2020); Abnormal MRI, hip (Bilateral) (10/04/2020); Chronic shoulder pain (Left); Cervicalgia; Chronic neck pain with history of cervical spinal surgery; Chronic hip pain (Left); Chronic groin pain (Left); Abnormal drug screen (11/21/2021); Chronic sacroiliac joint pain (Right); Chronic pain disorder; Arthralgia of hip; Trochanteric bursitis of hip (Right); Left thigh pain; Right buttock pain; Somatic dysfunction of sacroiliac joint (Right); Sacroiliac joint dysfunction (Right); Chronic low back pain (Bilateral) (R>L) w/o sciatica; Lumbar facet joint pain; Osteoarthritis of facet joint of lumbar spine; and Abnormal CT scan, cervical spine (07/18/2021) on their pertinent problem list. Pain Assessment: Severity of Chronic pain is reported as a 6 /10.  Location: Hip (bilatera shoulders) Right/hip pain down the right leg and around to the groin.   shoulder pain into bicep region. Onset: More than a month ago. Quality: Discomfort, Constant, Sore, Radiating, Sharp. Timing: Constant. Modifying factor(s): nothing currently. Vitals:  height is 5\' 5"  (1.651 m) and weight is 250 lb (113.4 kg). Her temporal temperature is 97.7 F (36.5 C). Her  blood pressure is 127/91 (abnormal) and her pulse is 90. Her respiration is 16 and oxygen saturation is 98%.  BMI: Estimated body mass index is 41.6 kg/m as calculated from the following:   Height as of this encounter: 5\' 5"  (1.651 m).   Weight as of this encounter: 250 lb (113.4 kg). Last encounter: 10/10/2022. Last procedure: 11/07/2022.  Reason for encounter: post-procedure evaluation and assessment. The patient indicates having attained 100% relief of the pain for approximately 1 week after which the pain started coming back.  The pain in the area of the acromioclavicular joint and over the suprascapular muscle has improved, but she is having more pain in the lateral aspect of the arm corresponding to the area usually associated with tendinosis of the biceps muscle.  Even more interestingly, she has a history of bilateral shoulder replacements and she states that her shoulders are "locking up".  For this reason, she went to see her orthopedic surgeon in the [facility] area and she was told that she would probably need some reverse replacements done bilaterally.  In addition to this, she has been experiencing more pain in the area of the right hip, right groin, right buttocks area with increased pain as well over the right trochanteric bursa.  She is also having pain on the left side, but not quite as bad as the one on the right side.  She does have a history of bilateral hip osteoarthritis.  Hip MRIs done on 10/03/2020 demonstrated left worse than right degenerative joint disease with associated tearing of the labrum that  seems to be more extensive on the left side.  There is also bilateral trochanteric bursitis with the left also being worse than the right.  As I said before this MRI was done on 10/03/2020 and her symptoms have been progressing, partly due to the fact that her BMI continues to be above 40.  Today I will go ahead and order a repeat set of x-rays of the hip joints looking to see the degree of progression that has occurred in the last 2 years.  The last time that we did hip injections was in October 2023.  Today she has requested that we repeat those shots.  I have also recommended that she talk to the orthopedic surgeon about this bilateral hip pain.  She has not had her hips replaced but I would not be surprised of the fact that she is going in that direction.  Today the patient is having an exacerbation of this hip pain and low back pain and has requested that we give her a Toradol/Robaxin injection which we have done for her today.  I will also be scheduling her to return for the intra-articular hip injections to see if we can get this pain under control however, I have talked to her today about the fact that she needs to aggressively manage her weight.  She understood and accepted.  Post-procedure evaluation   Procedure: Suprascapular nerve Radiofrequency Ablation (RFA) #2  Laterality: Left  Level: Superior to the scapular spine, in the lateral aspect of the supraspinatus fossa (Suprescapular notch).  Imaging: Fluoroscopic guidance Anesthesia: Local anesthesia (1-2% Lidocaine) Anxiolysis: IV  Versed  3 mg Sedation: Moderate conscious sedation (Fentanyl 1 ml).  DOS: 11/07/2022  Performed by: Oswaldo Done, MD  Purpose: Therapeutic Indications: Shoulder pain severe enough to impact quality of life or function.  Pain Score: Pre-procedure: 3 /10 Post-procedure: 0-No pain/10     Effectiveness:  Initial hour after procedure: 100 %.  Subsequent 4-6 hours post-procedure: 100 %. Analgesia  past initial 6 hours: 100 % (patient reports that she had good relief x 1 week and then pain came back all at one at the same severity as pre procedure. Shoulder is "locking up" and she has limited ROM). Ongoing improvement:  Analgesic: The patient indicates having attained 100% relief of the pain for approximately 1 week after which the pain started coming back.  The pain in the area of the acromioclavicular joint and over the suprascapular muscle has improved, but she is having more pain in the lateral aspect of the arm corresponding to the area usually associated with tendinosis of the biceps muscle.   Function: Somewhat improved ROM: Back to baseline  Pharmacotherapy Assessment  Analgesic: No chronic opioid analgesics therapy prescribed by our practice.  Discontinued (11/21/2021) UDS. (+) COCAINE.   Monitoring: [place] PMP: PDMP reviewed during this encounter.       Pharmacotherapy: No side-effects or adverse reactions reported. Compliance: No problems identified. Effectiveness: Clinically acceptable.  Vernie Ammons, RN  12/19/2022 12:39 PM  Sign when Signing Visit Safety precautions to be maintained throughout the outpatient stay will include: orient to surroundings, keep bed in low position, maintain call bell within reach at all times, provide assistance with transfer out of bed and ambulation.     No results found for: "CBDTHCR" No results found for: "D8THCCBX" No results found for: "D9THCCBX"  UDS:  Summary  Date Value Ref Range Status  11/21/2021 Note  Final    Comment:    ==================================================================== [employer] Select 13 (MW) ==================================================================== Test                             Result       Flag       Units  Drug Present and Declared for Prescription Verification   Oxycodone                      >2433        EXPECTED   ng/mg creat   Oxymorphone                    164          EXPECTED    ng/mg creat   Noroxycodone                   >2433        EXPECTED   ng/mg creat   Noroxymorphone                 58           EXPECTED   ng/mg creat    Sources of oxycodone are scheduled prescription medications.    Oxymorphone, noroxycodone, and noroxymorphone are expected    metabolites of oxycodone. Oxymorphone is also available as a    scheduled prescription medication.  Drug Present not Declared for Prescription Verification   Methamphetamine                12           UNEXPECTED ng/mg creat    Sources of methamphetamine include illicit sources, as a scheduled    prescription medication, as a metabolite of some prescription drugs,    or use of an l-methamphetamine inhaler.    Oxazepam  17           UNEXPECTED ng/mg creat    Oxazepam may be administered as a scheduled prescription medication;    it is also an expected metabolite of other benzodiazepine drugs,    including diazepam, chlordiazepoxide, prazepam, clorazepate,    halazepam, and temazepam.    Benzoylecgonine                38           UNEXPECTED ng/mg creat    Benzoylecgonine is a metabolite of cocaine; its presence indicates    use of this drug.  Source is most commonly illicit, but cocaine is    present in some topical anesthetic solutions.  ==================================================================== Test                      Result    Flag   Units      Ref Range   Creatinine              411              mg/dL      >=86 ==================================================================== Declared Medications:  The flagging and interpretation on this report are based on the  following declared medications.  Unexpected results may arise from  inaccuracies in the declared medications.   **Note: The testing scope of this panel includes these medications:   Oxycodone   **Note: The testing scope of this panel does not include the  following reported medications:   Albuterol   Azelastine  Cetirizine  Citalopram (Celexa)  Diclofenac (Voltaren)  Docusate (Colace)  Fluticasone (Trelegy)  Guaifenesin (Mucinex)  Levothyroxine (Synthroid)  Naloxone  Nystatin  Supplement  Tizanidine (Zanaflex)  Umeclidinium (Trelegy)  Vilanterol (Trelegy)  Vitamin D ==================================================================== For clinical consultation, please call [phone]. ====================================================================       ROS  Constitutional: Denies any fever or chills Gastrointestinal: No reported hemesis, hematochezia, vomiting, or acute GI distress Musculoskeletal: Denies any acute onset joint swelling, redness, loss of ROM, or weakness Neurological: No reported episodes of acute onset apraxia, aphasia, dysarthria, agnosia, amnesia, paralysis, loss of coordination, or loss of consciousness  Medication Review  Azelastine HCl, albuterol, budesonide-formoterol, cetirizine, cholecalciferol, citalopram, cyclobenzaprine, diclofenac Sodium, docusate sodium, levothyroxine, magnesium hydroxide, and tiotropium  History Review  Allergy: Ms. Chernoff is allergic to aripiprazole, duloxetine, gabapentin, gold, iodinated contrast media, naproxen sodium, nortriptyline hcl, nsaids, pregabalin, meperidine, ace inhibitors, aspirin, cefpodoxime, fluoxetine, fluticasone-salmeterol, levofloxacin, lithium, paroxetine, tape, telithromycin, theophylline, topiramate, trazodone, triamcinolone, venlafaxine, bupropion, ketorolac tromethamine, morphine, and moxifloxacin. Drug: Ms. Mclinn  reports no history of drug use. Alcohol:  reports no history of alcohol use. Tobacco:  reports that she has been smoking cigarettes. She has a 80.00 pack-year smoking history. She has never used smokeless tobacco. Social: Ms. Certo  reports that she has been smoking cigarettes. She has a 80.00 pack-year smoking history. She has never used smokeless tobacco. She reports that she  does not drink alcohol and does not use drugs. Medical:  has a past medical history of Acute postoperative pain (01/13/2017), Anxiety, Arthritis, Asthma, Bell's palsy, Bursitis, COPD (chronic obstructive pulmonary disease) (HCC), Depression, Fibromyalgia, Heart murmur, Hepatitis C, Hiatal hernia, Hyperlipidemia, Hypertension, IBS (irritable bowel syndrome), Insomnia, and Thyroid disease. Surgical: Ms. Irvan  has a past surgical history that includes necksurgery (10/21/2014); Back surgery (2013); Foot surgery (Right); Elbow surgery (Left); Carpal tunnel release (Right); Nose surgery; Partial hysterectomy; Total shoulder replacement (Bilateral); and Colonoscopy with propofol (N/A, 02/05/2017). Family:  family history includes Cancer in her mother; Gout in her mother; Heart disease in her father.  Laboratory Chemistry Profile   Renal Lab Results  Component Value Date   BUN 14 04/28/2019   CREATININE 0.98 04/28/2019   BCR 13 02/16/2019   GFRAA >60 04/28/2019   GFRNONAA >60 04/28/2019    Hepatic Lab Results  Component Value Date   AST 18 04/28/2019   ALT 16 04/28/2019   ALBUMIN 3.7 04/28/2019   ALKPHOS 80 04/28/2019    Electrolytes Lab Results  Component Value Date   NA 139 04/28/2019   K 4.1 04/28/2019   CL 105 04/28/2019   CALCIUM 9.3 04/28/2019   MG 2.1 02/16/2019    Bone Lab Results  Component Value Date   25OHVITD1 27 (L) 02/16/2019   25OHVITD2 2.0 02/16/2019   25OHVITD3 25 02/16/2019    Inflammation (CRP: Acute Phase) (ESR: Chronic Phase) Lab Results  Component Value Date   CRP 2 02/16/2019   ESRSEDRATE 23 02/16/2019         Note: Above Lab results reviewed.  Recent Imaging Review  DG PAIN CLINIC C-ARM 1-60 MIN NO REPORT Fluoro was used, but no Radiologist interpretation will be provided.  Please refer to "NOTES" tab for provider progress note. Note: Reviewed        Physical Exam  General appearance: Well nourished, well developed, and well hydrated. In no  apparent acute distress Mental status: Alert, oriented x 3 (person, place, & time)       Respiratory: No evidence of acute respiratory distress Eyes: PERLA Vitals: BP (!) 127/91 (BP Location: Right Arm, Patient Position: Sitting, Cuff Size: Large)   Pulse 90   Temp 97.7 F (36.5 C) (Temporal)   Resp 16   Ht 5\' 5"  (1.651 m)   Wt 250 lb (113.4 kg)   SpO2 98%   BMI 41.60 kg/m  BMI: Estimated body mass index is 41.6 kg/m as calculated from the following:   Height as of this encounter: 5\' 5"  (1.651 m).   Weight as of this encounter: 250 lb (113.4 kg). Ideal: Ideal body weight: 57 kg (125 lb 10.6 oz) Adjusted ideal body weight: 79.6 kg (175 lb 6.4 oz)  Assessment   Diagnosis Status  1. Chronic shoulder pain (Left)   2. Chronic shoulder pain (1ry area of Pain) (Bilateral) (R>L)   3. Chronic hip pain (2ry area of Pain) (Bilateral) (R>L)   4. History of total replacement of shoulder joints (Bilateral)   5. Osteoarthritis of shoulder (Bilateral)   6. Postop check   7. Abnormal CT scan, cervical spine (07/18/2021)   8. Acetabular labrum tear, sequela (Left)   9. Acetabular labrum tear, sequela (Right)   10. 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)   11. Abnormal MRI, hip (Bilateral) (10/04/2020)   12. Acute exacerbation of chronic low back pain    Deteriorating Deteriorating Having a Flare-up   Updated Problems: Problem  Abnormal CT scan, cervical spine (07/18/2021)   (07/18/2021) CERVICAL CT FINDINGS: Alignment: Mild anterolisthesis C2-3 and C6-7 Disc levels: C2-3: Mild disc degeneration. Moderate right facet degeneration. No significant stenosis   C3-4: ACDF. Persistent lucency through the disc space compatible with nonunion. Hardware in good position. Diffuse uncinate spurring. Mild to moderate spinal stenosis. Mild to moderate foraminal stenosis bilaterally.   C4-5: ACDF with solid fusion. Mild spinal stenosis due to uncinate spurring. Mild foraminal narrowing  bilaterally   C5-6: ACDF with solid fusion. Hardware in good position. No significant stenosis   C6-7:  Mild disc degeneration.  No significant stenosis   C7-T1: Disc degeneration and mild spurring.  Negative for stenosis.   Upper chest: Lung apices clear bilaterally   Other: None   IMPRESSION: 1. 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 Diffuse uncinate spurring causing moderate foraminal stenosis bilaterally and mild to moderate spinal stenosis. 2. ACDF with solid fusion C4-5 and C5-6. Mild spinal stenosis and mild foraminal narrowing bilaterally at C4-5     Plan of Care  Problem-specific:  No problem-specific Assessment & Plan notes found for this encounter.  Ms. CALLE RENTSCHLER has a current medication list which includes the following long-term medication(s): albuterol, azelastine hcl, citalopram, diclofenac sodium, levothyroxine, and symbicort.  Pharmacotherapy (Medications Ordered): Meds ordered this encounter  Medications   DISCONTD: orphenadrine (NORFLEX) injection 60 mg   methocarbamol (ROBAXIN) injection 200 mg   ketorolac (TORADOL) injection 60 mg   Orders:  Orders Placed This Encounter  Procedures   HIP INJECTION    Standing Status:   Future    Standing Expiration Date:   03/21/2023    Scheduling Instructions:     Procedure: Intra-articular Hip joint injection     Side: Bilateral     Approach: Lateral     Sedation: With Sedation.     Timeframe: ASAP   DG HIP UNILAT W OR W/O PELVIS 2-3 VIEWS RIGHT    Please describe any evidence of DJD, such as joint narrowing, asymmetry, cysts, or any anomalies in bone density, production, or erosion.    Standing Status:   Future    Standing Expiration Date:   01/18/2023    Scheduling Instructions:     Please make sure that the patient understands that this needs to be done as soon as possible. Never have the patient do the imaging "just before the next appointment". Inform patient that having the imaging done within the [facility]  Network will expedite the availability of the results and will provide      imaging availability to the requesting physician. In addition inform the patient that the imaging order has an expiration date and will not be renewed if not done within the active period.    Order Specific Question:   Reason for Exam (SYMPTOM  OR DIAGNOSIS REQUIRED)    Answer:   Right hip pain/arthralgia    Order Specific Question:   Preferred imaging location?    Answer:   [facility]    Order Specific Question:   Call Results- Best Contact Number?    Answer:   [phone] [facility])    Order Specific Question:   Release to patient    Answer:   Immediate   DG HIP UNILAT W OR W/O PELVIS 2-3 VIEWS LEFT    Please describe any evidence of DJD, such as joint narrowing, asymmetry, cysts, or any anomalies in bone density, production, or erosion.    Standing Status:   Future    Standing Expiration Date:   01/18/2023    Scheduling Instructions:     Please make sure that the patient understands that this needs to be done as soon as possible. Never have the patient do the imaging "just before the next appointment". Inform patient that having the imaging done within the [facility] Network will expedite the availability of the results and will provide      imaging availability to the requesting physician. In addition inform the patient that the imaging order has an expiration date and will not be renewed if not done within the active period.  Order Specific Question:   Reason for Exam (SYMPTOM  OR DIAGNOSIS REQUIRED)    Answer:   Right hip pain/arthralgia    Order Specific Question:   Preferred imaging location?    Answer:   [facility]    Order Specific Question:   Call Results- Best Contact Number?    Answer:   [phone] [facility])    Order Specific Question:   Release to patient    Answer:   Immediate   Nursing Instructions:    Please complete this patient's postprocedure evaluation.     Scheduling Instructions:     Please complete this patient's postprocedure evaluation.   Informed Consent Details: Physician/Practitioner Attestation; Transcribe to consent form and obtain patient signature    Do not administer NSAIDs (Toradol, etc.) if patient has an allergy or intolerance to NSAIDs or if patient has CKD (chronic kidney disease or failure). Avoid the use of Muscle Relaxants (orphenedrine/Norflex, etc.) if patient has allergy or intolerance to this medication.    Scheduling Instructions:     Nursing orders: Complete pain questionnaire before administration of therapy. Document location, laterality, onset, level, trigger, and current medications taken for the pain.    Order Specific Question:   Physician/Practitioner attestation of informed consent for procedure/surgical case    Answer:   I, the physician/practitioner, attest that I have discussed with the patient the benefits, risks, side effects, alternatives, likelihood of achieving goals and potential problems during recovery for the procedure that I have provided informed consent.    Order Specific Question:   Procedure    Answer:   IM injection of therapeutic substance    Order Specific Question:   Physician/Practitioner performing the procedure    Answer:   Haruto Demaria A. Laban Emperor, MD    Order Specific Question:   Indication/Reason    Answer:   Acute on chronic pain   Follow-up plan:   Return for (ECT): (B) IA Hip Inj..      Interventional Therapies  Risk Factors  Considerations:   Allergy (Anaphylactic-type): Contrast dye.  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 (+) COCAINE.   Planned  Pending:   Therapeutic bilateral IA hip joint injection + trochanteric bursa injection.  Therapeutic left suprascapular nerve RFA #2    Under consideration:   Diagnostic cervical spinal cord stimulator trial   Diagnostic caudal ESI + epidurogram  Diagnostic right CESI  Diagnostic/therapeutic right cervical facet MBB #2 Diagnostic left SI Blk    Completed:   Palliative left trochanteric bursa x2 (09/18/2021) (100/100/90)  Palliative right trochanteric bursa x4 (09/17/2022) (100/100/90/70)  Palliative right suprascapular NB x3 (08/08/2020) (50/50/100/90-100)   Palliative left suprascapular NB x4 (02/22/2021) (100/100/90/90)  Palliative bilateral suprascapular nerve RFA x1 (05/13/2019) (100/100/90/90-100)  Therapeutic right IA hip joint injection x6 (09/17/2022) (100/100/70/70)  Therapeutic left IA hip joint injection x4 (09/18/2021) (100/100/90)  Diagnostic bilateral lumbar facet MBB x4 (09/17/2022) (100/100/70/70)  Palliative right lumbar facet RFA x2 (12/16/2017) (100/100/85)  Palliative left lumbar facet RFA x2 (02/12/2018) John [facility])  Diagnostic right cervical facet MBB x2 (04/11/2022) (100/100/100/100)  Diagnostic left cervical facet MBB x1 (09/30/2016) (90/100/0)  Diagnostic right SI joint block x1 (06/12/2017) (DNKFU -0/0/80)    Therapeutic  Palliative (PRN) options:   Therapeutic trochanteric bursa   Therapeutic suprascapular NB   Therapeutic suprascapular nerve RFA   Therapeutic IA hip joint injection   Therapeutic lumbar facet  MBB   Therapeutic lumbar facet RFA   Therapeutic SI Blk     Pharmacotherapy  Nonopioids transferred 05/15/2020: Zanaflex and Voltaren gel         Recent Visits Date Type Provider Dept  11/07/22 Procedure visit Delano Metz, MD [facility]  10/10/22 Office Visit Delano Metz, MD [facility]  Showing recent visits within past 90 days and meeting all other requirements Today's Visits Date Type Provider Dept  12/19/22 Office Visit Delano Metz, MD [facility]  Showing today's visits and meeting all other requirements Future Appointments No visits were found meeting these conditions. Showing future  appointments within next 90 days and meeting all other requirements  I discussed the assessment and treatment plan with the patient. The patient was provided an opportunity to ask questions and all were answered. The patient agreed with the plan and demonstrated an understanding of the instructions.  Patient advised to call back or seek an in-person evaluation if the symptoms or condition worsens.  Duration of encounter: 30 minutes.  Total time on encounter, as per AMA guidelines included both the face-to-face and non-face-to-face time personally spent by the physician and/or other qualified health care professional(s) on the day of the encounter (includes time in activities that require the physician or other qualified health care professional and does not include time in activities normally performed by clinical staff). Physician's time may include the following activities when performed: Preparing to see the patient (e.g., pre-charting review of records, searching for previously ordered imaging, lab work, and nerve conduction tests) Review of prior analgesic pharmacotherapies. Reviewing PMP Interpreting ordered tests (e.g., lab work, imaging, nerve conduction tests) Performing post-procedure evaluations, including interpretation of diagnostic procedures Obtaining and/or reviewing separately obtained history Performing a medically appropriate examination and/or evaluation Counseling and educating the patient/family/caregiver Ordering medications, tests, or procedures Referring and communicating with other health care professionals (when not separately reported) Documenting clinical information in the electronic or other health record Independently interpreting results (not separately reported) and communicating results to the patient/ family/caregiver Care coordination (not separately reported)  Note by: Oswaldo Done, MD Date: 12/19/2022; Time: 1:26 PM

## 2022-12-19 NOTE — Patient Instructions (Addendum)

## 2022-12-19 NOTE — Progress Notes (Signed)
Safety precautions to be maintained throughout the outpatient stay will include: orient to surroundings, keep bed in low position, maintain call bell within reach at all times, provide assistance with transfer out of bed and ambulation.  

## 2022-12-30 ENCOUNTER — Ambulatory Visit
Admission: RE | Admit: 2022-12-30 | Discharge: 2022-12-30 | Disposition: A | Discharge status: Home / Self Care | Payer: Medicaid Other | Source: Ambulatory Visit | Attending: Pain Medicine | Admitting: Pain Medicine

## 2022-12-30 ENCOUNTER — Other Ambulatory Visit: Payer: Self-pay | Admitting: Pain Medicine

## 2022-12-30 ENCOUNTER — Ambulatory Visit
Admission: RE | Admit: 2022-12-30 | Discharge: 2022-12-30 | Disposition: A | Discharge status: Home / Self Care | Payer: Medicaid Other | Attending: Pain Medicine | Admitting: Pain Medicine

## 2022-12-30 DIAGNOSIS — M25551 Pain in right hip: Secondary | ICD-10-CM | POA: Diagnosis not present

## 2022-12-30 DIAGNOSIS — R937 Abnormal findings on diagnostic imaging of other parts of musculoskeletal system: Secondary | ICD-10-CM

## 2022-12-30 DIAGNOSIS — M25552 Pain in left hip: Secondary | ICD-10-CM | POA: Diagnosis not present

## 2022-12-30 DIAGNOSIS — M160 Bilateral primary osteoarthritis of hip: Secondary | ICD-10-CM

## 2022-12-30 DIAGNOSIS — Z09 Encounter for follow-up examination after completed treatment for conditions other than malignant neoplasm: Secondary | ICD-10-CM

## 2022-12-30 DIAGNOSIS — S73191S Other sprain of right hip, sequela: Secondary | ICD-10-CM | POA: Diagnosis present

## 2022-12-30 DIAGNOSIS — G8929 Other chronic pain: Secondary | ICD-10-CM

## 2022-12-30 DIAGNOSIS — Z96612 Presence of left artificial shoulder joint: Secondary | ICD-10-CM

## 2022-12-30 DIAGNOSIS — R935 Abnormal findings on diagnostic imaging of other abdominal regions, including retroperitoneum: Secondary | ICD-10-CM

## 2022-12-30 DIAGNOSIS — S73192S Other sprain of left hip, sequela: Secondary | ICD-10-CM

## 2022-12-30 DIAGNOSIS — Z96611 Presence of right artificial shoulder joint: Secondary | ICD-10-CM

## 2022-12-30 DIAGNOSIS — M545 Low back pain, unspecified: Secondary | ICD-10-CM

## 2022-12-30 DIAGNOSIS — M19011 Primary osteoarthritis, right shoulder: Secondary | ICD-10-CM

## 2023-01-01 NOTE — Progress Notes (Unsigned)
PROVIDER NOTE: Interpretation of information contained herein should be left to medically-trained personnel. Specific patient instructions are provided elsewhere under "Patient Instructions" section of medical record. This document was created in part using STT-dictation technology, any transcriptional errors that may result from this process are unintentional.  Patient: Danielle [name] Type: Established DOB: Jan 11, 1961 MRN: [id number] PCP: Jenell Milliner, MD  Service: Procedure DOS: 01/02/2023 Setting: Ambulatory Location: Ambulatory outpatient facility Delivery: Face-to-face Provider: Oswaldo Done, MD Specialty: Interventional Pain Management Specialty designation: 09 Location: Outpatient facility Ref. Prov.: Delano Metz, MD       Interventional Therapy   Type: Hip & bursae injection  [IA Hip (R7L5) & TB (R5L3)]   Laterality: Bilateral (-50) (R7L5) Bursae: Trochanteric  Laterality: Bilateral (-50) (R5L3) Approach: Percutaneous posterolateral approach. Level: Lower pelvic and hip joint level.  Imaging: Fluoroscopy-guided         Anesthesia: Local anesthesia (1-2% Lidocaine) Anxiolysis: IV Versed 2.0 mg Sedation: Moderate Sedation Fentanyl 1 mL (50 mcg) DOS: 01/02/2023  Performed by: Oswaldo Done, MD  Purpose: Diagnostic/Therapeutic Indications: Hip pain severe enough to impact quality of life or function. Rationale (medical necessity): procedure needed and proper for the diagnosis and/or treatment of Danielle [name]'s medical symptoms and needs. 1. Chronic hip pain (2ry area of Pain) (Bilateral) (R>L)   2. Greater trochanteric bursitis of hips (Bilateral)   3. 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)   4. Acetabular labrum tear, sequela (Left)   5. Acetabular labrum tear, sequela (Right)   6. Abnormal MRI, hip (Bilateral) (10/04/2020)    History of drug-induced anaphylaxis (Contrast Dye)    History of allergy to iodine & radiographic dye    NAS-11 Pain  score:   Pre-procedure: 3 /10   Post-procedure: 3 /10 (0/10 on left, right side is still hurting)     Target: Intra-articular aspect of the hip joint & peri-articular bursae Region: Hip joint proper. Femoral region Procedure Type: Percutaneous injection   Position / Prep / Materials:  Position: Prone  Prep solution: DuraPrep (Iodine Povacrylex [0.7% available iodine] and Isopropyl Alcohol, 74% w/w) Prep Area:  Entire Posterolateral hip area. Materials:  Tray: Block tray Needle(s):  Type: Spinal  Gauge (G): 22  Length: 5-in  Qty: 2  Pre-op H&P Assessment:  Danielle [name] is a 62 y.o. (year old), female patient, seen today for interventional treatment. She  has a past surgical history that includes necksurgery (10/21/2014); Back surgery (2013); Foot surgery (Right); Elbow surgery (Left); Carpal tunnel release (Right); Nose surgery; Partial hysterectomy; Total shoulder replacement (Bilateral); and Colonoscopy with propofol (N/A, 02/05/2017). Danielle [name] has a current medication list which includes the following prescription(s): albuterol, azelastine hcl, cetirizine, cholecalciferol, citalopram, cyclobenzaprine, diclofenac sodium, docusate sodium, levothyroxine, magnesium hydroxide, spiriva handihaler, and symbicort, and the following Facility-Administered Medications: fentanyl, lactated ringers, and pentafluoroprop-tetrafluoroeth. Her primarily concern today is the Hip Pain (right)  Initial Vital Signs:  Pulse/HCG Rate: (!) 102ECG Heart Rate: 86 Temp: 97.7 F (36.5 C) Resp: 12 BP: (!) 150/121 SpO2: 98 %  BMI: Estimated body mass index is 41.27 kg/m as calculated from the following:   Height as of this encounter: 5\' 5"  (1.651 m).   Weight as of this encounter: 248 lb (112.5 kg).  Risk Assessment: Allergies: Reviewed. She is allergic to aripiprazole, duloxetine, gabapentin, gold, iodinated contrast media, naproxen sodium, nortriptyline hcl, nsaids, pregabalin, meperidine, ace inhibitors,  aspirin, cefpodoxime, fluoxetine, fluticasone-salmeterol, levofloxacin, lithium, paroxetine, tape, telithromycin, theophylline, topiramate, trazodone, triamcinolone, venlafaxine, bupropion, ketorolac tromethamine, morphine, and moxifloxacin.  Allergy Precautions: None  required Coagulopathies: Reviewed. None identified.  Blood-thinner therapy: None at this time Active Infection(s): Reviewed. None identified. Danielle [name] is afebrile  Site Confirmation: Danielle [name] was asked to confirm the procedure and laterality before marking the site Procedure checklist: Completed Consent: Before the procedure and under the influence of no sedative(s), amnesic(s), or anxiolytics, the patient was informed of the treatment options, risks and possible complications. To fulfill our ethical and legal obligations, as recommended by the American Medical Association's Code of Ethics, I have informed the patient of my clinical impression; the nature and purpose of the treatment or procedure; the risks, benefits, and possible complications of the intervention; the alternatives, including doing nothing; the risk(s) and benefit(s) of the alternative treatment(s) or procedure(s); and the risk(s) and benefit(s) of doing nothing. The patient was provided information about the general risks and possible complications associated with the procedure. These may include, but are not limited to: failure to achieve desired goals, infection, bleeding, organ or nerve damage, allergic reactions, paralysis, and death. In addition, the patient was informed of those risks and complications associated to the procedure, such as failure to decrease pain; infection; bleeding; organ or nerve damage with subsequent damage to sensory, motor, and/or autonomic systems, resulting in permanent pain, numbness, and/or weakness of one or several areas of the body; allergic reactions; (i.e.: anaphylactic reaction); and/or death. Furthermore, the patient was  informed of those risks and complications associated with the medications. These include, but are not limited to: allergic reactions (i.e.: anaphylactic or anaphylactoid reaction(s)); adrenal axis suppression; blood sugar elevation that in diabetics may result in ketoacidosis or comma; water retention that in patients with history of congestive heart failure may result in shortness of breath, pulmonary edema, and decompensation with resultant heart failure; weight gain; swelling or edema; medication-induced neural toxicity; particulate matter embolism and blood vessel occlusion with resultant organ, and/or nervous system infarction; and/or aseptic necrosis of one or more joints. Finally, the patient was informed that Medicine is not an exact science; therefore, there is also the possibility of unforeseen or unpredictable risks and/or possible complications that may result in a catastrophic outcome. The patient indicated having understood very clearly. We have given the patient no guarantees and we have made no promises. Enough time was given to the patient to ask questions, all of which were answered to the patient's satisfaction. Ms. Breck has indicated that she wanted to continue with the procedure. Attestation: I, the ordering provider, attest that I have discussed with the patient the benefits, risks, side-effects, alternatives, likelihood of achieving goals, and potential problems during recovery for the procedure that I have provided informed consent. Date  Time: 01/02/2023  9:28 AM  Pre-Procedure Preparation:  Monitoring: As per clinic protocol. Respiration, ETCO2, SpO2, BP, heart rate and rhythm monitor placed and checked for adequate function Safety Precautions: Patient was assessed for positional comfort and pressure points before starting the procedure. Time-out: I initiated and conducted the "Time-out" before starting the procedure, as per protocol. The patient was asked to participate by  confirming the accuracy of the "Time Out" information. Verification of the correct person, site, and procedure were performed and confirmed by me, the nursing staff, and the patient. "Time-out" conducted as per Joint Commission's Universal Protocol (UP.01.01.01). Time: 1046 Start Time: 1046 hrs.  Narrative                Rationale (medical necessity): procedure needed and proper for the diagnosis and/or treatment of the patient's medical symptoms and needs. Procedural Technique  Safety Precautions: Aspiration looking for blood return was conducted prior to all injections. At no point did we inject any substances, as a needle was being advanced. No attempts were made at seeking any paresthesias. Safe injection practices and needle disposal techniques used. Medications properly checked for expiration dates. SDV (single dose vial) medications used. Description of the Procedure: Protocol guidelines were followed. The patient was assisted into a comfortable position. The target area was identified and the area prepped in the usual manner. Skin & deeper tissues infiltrated with local anesthetic. Appropriate amount of time allowed to pass for local anesthetics to take effect. The procedure needles were then advanced to the target area. Proper needle placement secured. Negative aspiration confirmed. Solution injected in intermittent fashion, asking for systemic symptoms every 0.5cc of injectate. The needles were then removed and the area cleansed, making sure to leave some of the prepping solution back to take advantage of its long term bactericidal properties.  Technical description of procedure:  Skin & deeper tissues infiltrated with local anesthetic. Appropriate amount of time allowed to pass for local anesthetics to take effect. The procedure needles were then advanced to the target area. Proper needle placement secured. Negative aspiration confirmed. Solution injected in intermittent fashion, asking for  systemic symptoms every 0.5cc of injectate. The needles were then removed and the area cleansed, making sure to leave some of the prepping solution back to take advantage of its long term bactericidal properties.             Vitals:   01/02/23 1055 01/02/23 1100 01/02/23 1110 01/02/23 1120  BP: (!) 152/106 (!) 143/81 128/80 130/82  Pulse:      Resp: 13 14 15 15   Temp:  97.6 F (36.4 C)  (!) 97.3 F (36.3 C)  TempSrc:      SpO2: 99% 96% 98% 98%  Weight:      Height:         Start Time: 1046 hrs. End Time: 1054 hrs.  Imaging Guidance (Non-Spinal):          Type of Imaging Technique: Fluoroscopy Guidance (Non-Spinal) Indication(s): Assistance in needle guidance and placement for procedures requiring needle placement in or near specific anatomical locations not easily accessible without such assistance. Exposure Time: Please see nurses notes. Contrast: Before injecting any contrast, we confirmed that the patient did not have an allergy to iodine, shellfish, or radiological contrast. Once satisfactory needle placement was completed at the desired level, radiological contrast was injected. Contrast injected under live fluoroscopy. No contrast complications. See chart for type and volume of contrast used. Fluoroscopic Guidance: I was personally present during the use of fluoroscopy. "Tunnel Vision Technique" used to obtain the best possible view of the target area. Parallax error corrected before commencing the procedure. "Direction-depth-direction" technique used to introduce the needle under continuous pulsed fluoroscopy. Once target was reached, antero-posterior, oblique, and lateral fluoroscopic projection used confirm needle placement in all planes. Images permanently stored in EMR. Interpretation: I personally interpreted the imaging intraoperatively. Adequate needle placement confirmed in multiple planes. Appropriate spread of contrast into desired area was observed. No evidence of  afferent or efferent intravascular uptake. Permanent images saved into the patient's record.  Post-operative Assessment:  Post-procedure Vital Signs:  Pulse/HCG Rate: (!) 10280 Temp: (!) 97.3 F (36.3 C) Resp: 15 BP: 130/82 SpO2: 98 %  EBL: None  Complications: No immediate post-treatment complications observed by team, or reported by patient.  Note: The patient tolerated the entire procedure well. A repeat set  of vitals were taken after the procedure and the patient was kept under observation following institutional policy, for this type of procedure. Post-procedural neurological assessment was performed, showing return to baseline, prior to discharge. The patient was provided with post-procedure discharge instructions, including a section on how to identify potential problems. Should any problems arise concerning this procedure, the patient was given instructions to immediately contact us, at any time, without hesitation. In any case, we plan to contact the patient by telephone for a follow-up status report regarding this interventional procedure.  Comments:  No additional relevant information.  Plan of Care (POC)  Orders:  Orders Placed This Encounter  Procedures   HIP INJECTION    Scheduling Instructions:     Side: Bilateral     Sedation: With Sedation.     Timeframe: Today   HIP INJECTION    Purpose: Palliative Indication: Hip pain 2ry to Trochanteric Burlitis bilateral (M70.61, M70.62).    Scheduling Instructions:     Procedure: Trochanteric bursa injection     Laterality: Bilateral     Sedation: With Sedation.     Timeframe: Today   DG PAIN CLINIC C-ARM 1-60 MIN NO REPORT    Intraoperative interpretation by procedural physician at [facility].    Standing Status:   Standing    Number of Occurrences:   1    Order Specific Question:   Reason for exam:    Answer:   Assistance in needle guidance and placement for procedures requiring needle placement in or near  specific anatomical locations not easily accessible without such assistance.   Informed Consent Details: Physician/Practitioner Attestation; Transcribe to consent form and obtain patient signature    Nursing Order: Transcribe to consent form and obtain patient signature. Note: Always confirm laterality of pain with Ms. Neale Burly, before procedure.    Order Specific Question:   Physician/Practitioner attestation of informed consent for procedure/surgical case    Answer:   I, the physician/practitioner, attest that I have discussed with the patient the benefits, risks, side effects, alternatives, likelihood of achieving goals and potential problems during recovery for the procedure that I have provided informed consent.    Order Specific Question:   Procedure    Answer:   Hip injection    Order Specific Question:   Physician/Practitioner performing the procedure    Answer:   Martina Brodbeck A. Laban Emperor, MD    Order Specific Question:   Indication/Reason    Answer:   Hip Joint Pain (Arthralgia)   Informed Consent Details: Physician/Practitioner Attestation; Transcribe to consent form and obtain patient signature    Note: Always confirm laterality of pain with Ms. Neale Burly, before procedure. Transcribe to consent form and obtain patient signature.    Order Specific Question:   Physician/Practitioner attestation of informed consent for procedure/surgical case    Answer:   I, the physician/practitioner, attest that I have discussed with the patient the benefits, risks, side effects, alternatives, likelihood of achieving goals and potential problems during recovery for the procedure that I have provided informed consent.    Order Specific Question:   Procedure    Answer:   Hip bursa injection    Order Specific Question:   Physician/Practitioner performing the procedure    Answer:   Vladislav Axelson A. Laban Emperor, MD    Order Specific Question:   Indication/Reason    Answer:   Hip bursitis   Provide equipment / supplies  at bedside    Procedure tray: "Block Tray" (Disposable  single use) Skin infiltration needle:  Regular 1.5-in, 25-G, (x1) Block Needle type: Spinal Amount/quantity: 2 Size: Long (7-inch) Gauge: 22G    Standing Status:   Standing    Number of Occurrences:   1    Order Specific Question:   Specify    Answer:   Block Tray   Follow-up    Schedule Ms. [place] for a post-procedure follow-up evaluation encounter 2 weeks from now.    Standing Status:   Future    Standing Expiration Date:   01/16/2023    Scheduling Instructions:     Schedule follow-up visit on afternoon of procedure day (T, Th)     Type: Face-to-face (F2F) Post-procedure (PP) evaluation (E/M)     When: 2 weeks from now   Chronic Opioid Analgesic:  No chronic opioid analgesics therapy prescribed by our practice.  Discontinued (11/21/2021) UDS. (+) COCAINE.   Medications ordered for procedure: Meds ordered this encounter  Medications   lidocaine (XYLOCAINE) 2 % (with pres) injection 400 mg   pentafluoroprop-tetrafluoroeth (GEBAUERS) aerosol   lactated ringers infusion   midazolam (VERSED) 5 MG/5ML injection 0.5-2 mg    Make sure Flumazenil is available in the pyxis when using this medication. If oversedation occurs, administer 0.2 mg IV over 15 sec. If after 45 sec no response, administer 0.2 mg again over 1 min; may repeat at 1 min intervals; not to exceed 4 doses (1 mg)   fentaNYL (SUBLIMAZE) injection 25-50 mcg    Make sure Narcan is available in the pyxis when using this medication. In the event of respiratory depression (RR< 8/min): Titrate NARCAN (naloxone) in increments of 0.1 to 0.2 mg IV at 2-3 minute intervals, until desired degree of reversal.   methylPREDNISolone acetate (DEPO-MEDROL) injection 160 mg   ropivacaine (PF) 2 mg/mL (0.2%) (NAROPIN) injection 18 mL   Medications administered: We administered lidocaine, lactated ringers, midazolam, fentaNYL, methylPREDNISolone acetate, and ropivacaine (PF) 2 mg/mL  (0.2%).  See the medical record for exact dosing, route, and time of administration.  Follow-up plan:   Return in about 2 weeks (around 01/16/2023) for Proc-day (T,Th), (Face2F), (PPE).       Interventional Therapies  Risk Factors  Considerations:   Allergy (Anaphylactic-type): Contrast dye.  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 (+) COCAINE.   Planned  Pending:   Therapeutic bilateral IA hip joint injection + trochanteric bursa injection.  Therapeutic left suprascapular nerve RFA #2    Under consideration:   Diagnostic cervical spinal cord stimulator trial  Diagnostic caudal ESI + epidurogram  Diagnostic right CESI  Diagnostic/therapeutic right cervical facet MBB #2 Diagnostic left SI Blk    Completed:   Palliative left trochanteric bursa x2 (09/18/2021) (100/100/90)  Palliative right trochanteric bursa x4 (09/17/2022) (100/100/90/70)  Palliative right suprascapular NB x3 (08/08/2020) (50/50/100/90-100)   Palliative left suprascapular NB x4 (02/22/2021) (100/100/90/90)  Palliative bilateral suprascapular nerve RFA x1 (05/13/2019) (100/100/90/90-100)  Therapeutic right IA hip joint injection x6 (09/17/2022) (100/100/70/70)  Therapeutic left IA hip joint injection x4 (09/18/2021) (100/100/90)  Diagnostic bilateral lumbar facet MBB x4 (09/17/2022) (100/100/70/70)  Palliative right lumbar facet RFA x2 (12/16/2017) (100/100/85)  Palliative left lumbar facet RFA x2 (02/12/2018) [facility])  Diagnostic right cervical facet MBB x2 (04/11/2022) (100/100/100/100)  Diagnostic left cervical facet MBB x1 (09/30/2016) (90/100/0)  Diagnostic right SI joint block x1 (06/12/2017) (DNKFU -0/0/80)    Therapeutic  Palliative (PRN) options:   Therapeutic trochanteric  bursa   Therapeutic suprascapular NB   Therapeutic suprascapular nerve RFA   Therapeutic IA hip joint injection    Therapeutic lumbar facet MBB   Therapeutic lumbar facet RFA   Therapeutic SI Blk     Pharmacotherapy  Nonopioids transferred 05/15/2020: Zanaflex and Voltaren gel          Recent Visits Date Type Provider Dept  12/19/22 Office Visit Delano Metz, MD [facility]  11/07/22 Procedure visit Delano Metz, MD [facility]  10/10/22 Office Visit Delano Metz, MD [facility]  Showing recent visits within past 90 days and meeting all other requirements Today's Visits Date Type Provider Dept  01/02/23 Procedure visit Delano Metz, MD [facility]  Showing today's visits and meeting all other requirements Future Appointments Date Type Provider Dept  01/28/23 Appointment Delano Metz, MD [facility]  Showing future appointments within next 90 days and meeting all other requirements  Disposition: Discharge home  Discharge (Date  Time): 01/02/2023;   hrs.   Primary Care Physician: Jenell Milliner, MD Location: [facility] Note by: Oswaldo Done, MD (TTS technology used. I apologize for any typographical errors that were not detected and corrected.) Date: 01/02/2023; Time: 11:30 AM  Disclaimer:  Medicine is not an Visual merchandiser. The only guarantee in medicine is that nothing is guaranteed. It is important to note that the decision to proceed with this intervention was based on the information collected from the patient. The Data and conclusions were drawn from the patient's questionnaire, the interview, and the physical examination. Because the information was provided in large part by the patient, it cannot be guaranteed that it has not been purposely or unconsciously manipulated. Every effort has been made to obtain as much relevant data as possible for this evaluation. It is important to note that the conclusions that lead to this procedure are derived in large part from the  available data. Always take into account that the treatment will also be dependent on availability of resources and existing treatment guidelines, considered by other Pain Management Practitioners as being common knowledge and practice, at the time of the intervention. For Medico-Legal purposes, it is also important to point out that variation in procedural techniques and pharmacological choices are the acceptable norm. The indications, contraindications, technique, and results of the above procedure should only be interpreted and judged by a Board-Certified Interventional Pain Specialist with extensive familiarity and expertise in the same exact procedure and technique.

## 2023-01-02 ENCOUNTER — Ambulatory Visit: Payer: Medicaid Other | Attending: Pain Medicine | Admitting: Pain Medicine

## 2023-01-02 ENCOUNTER — Ambulatory Visit
Admission: RE | Admit: 2023-01-02 | Discharge: 2023-01-02 | Disposition: A | Discharge status: Home / Self Care | Payer: Medicaid Other | Source: Ambulatory Visit | Attending: Pain Medicine | Admitting: Pain Medicine

## 2023-01-02 ENCOUNTER — Encounter: Payer: Self-pay | Admitting: Pain Medicine

## 2023-01-02 VITALS — BP 130/82 | HR 102 | Temp 97.3°F | Resp 15 | Ht 65.0 in | Wt 248.0 lb

## 2023-01-02 DIAGNOSIS — Z5189 Encounter for other specified aftercare: Secondary | ICD-10-CM | POA: Insufficient documentation

## 2023-01-02 DIAGNOSIS — Z91041 Radiographic dye allergy status: Secondary | ICD-10-CM | POA: Insufficient documentation

## 2023-01-02 DIAGNOSIS — Z87892 Personal history of anaphylaxis: Secondary | ICD-10-CM | POA: Insufficient documentation

## 2023-01-02 DIAGNOSIS — S73191S Other sprain of right hip, sequela: Secondary | ICD-10-CM | POA: Insufficient documentation

## 2023-01-02 DIAGNOSIS — M25552 Pain in left hip: Secondary | ICD-10-CM | POA: Diagnosis not present

## 2023-01-02 DIAGNOSIS — G8929 Other chronic pain: Secondary | ICD-10-CM | POA: Insufficient documentation

## 2023-01-02 DIAGNOSIS — M7062 Trochanteric bursitis, left hip: Secondary | ICD-10-CM | POA: Insufficient documentation

## 2023-01-02 DIAGNOSIS — M7061 Trochanteric bursitis, right hip: Secondary | ICD-10-CM | POA: Diagnosis present

## 2023-01-02 DIAGNOSIS — R935 Abnormal findings on diagnostic imaging of other abdominal regions, including retroperitoneum: Secondary | ICD-10-CM | POA: Insufficient documentation

## 2023-01-02 DIAGNOSIS — M25551 Pain in right hip: Secondary | ICD-10-CM | POA: Diagnosis present

## 2023-01-02 DIAGNOSIS — S73192S Other sprain of left hip, sequela: Secondary | ICD-10-CM | POA: Insufficient documentation

## 2023-01-02 DIAGNOSIS — M160 Bilateral primary osteoarthritis of hip: Secondary | ICD-10-CM | POA: Insufficient documentation

## 2023-01-02 MED ORDER — MIDAZOLAM HCL 5 MG/5ML IJ SOLN
INTRAMUSCULAR | Status: AC
  Filled 2023-01-02: qty 5

## 2023-01-02 MED ORDER — PENTAFLUOROPROP-TETRAFLUOROETH EX AERO
INHALATION_SPRAY | Freq: Once | CUTANEOUS | Status: DC
  Filled 2023-01-02: qty 116

## 2023-01-02 MED ORDER — LACTATED RINGERS IV SOLN: Freq: Once | INTRAVENOUS | Status: AC

## 2023-01-02 MED ORDER — LIDOCAINE HCL 2 % IJ SOLN
INTRAMUSCULAR | Status: AC
  Filled 2023-01-02: qty 20

## 2023-01-02 MED ORDER — ROPIVACAINE HCL 2 MG/ML IJ SOLN
18.0000 mL | Freq: Once | INTRAMUSCULAR | Status: AC
  Administered 2023-01-02: 18 mL via INTRA_ARTICULAR

## 2023-01-02 MED ORDER — ROPIVACAINE HCL 2 MG/ML IJ SOLN
INTRAMUSCULAR | Status: AC
  Filled 2023-01-02: qty 20

## 2023-01-02 MED ORDER — METHYLPREDNISOLONE ACETATE 80 MG/ML IJ SUSP
INTRAMUSCULAR | Status: AC
  Filled 2023-01-02: qty 2

## 2023-01-02 MED ORDER — FENTANYL CITRATE (PF) 100 MCG/2ML IJ SOLN
25.0000 ug | INTRAMUSCULAR | Status: DC | PRN
  Administered 2023-01-02: 50 ug via INTRAVENOUS

## 2023-01-02 MED ORDER — LIDOCAINE HCL 2 % IJ SOLN
20.0000 mL | Freq: Once | INTRAMUSCULAR | Status: AC
  Administered 2023-01-02: 400 mg

## 2023-01-02 MED ORDER — MIDAZOLAM HCL 5 MG/5ML IJ SOLN
0.5000 mg | Freq: Once | INTRAMUSCULAR | Status: AC
  Administered 2023-01-02: 2 mg via INTRAVENOUS

## 2023-01-02 MED ORDER — METHYLPREDNISOLONE ACETATE 80 MG/ML IJ SUSP
160.0000 mg | Freq: Once | INTRAMUSCULAR | Status: AC
  Administered 2023-01-02: 160 mg via INTRA_ARTICULAR

## 2023-01-02 MED ORDER — FENTANYL CITRATE (PF) 100 MCG/2ML IJ SOLN
INTRAMUSCULAR | Status: AC
  Filled 2023-01-02: qty 2

## 2023-01-02 NOTE — Patient Instructions (Signed)
____________________________________________________________________________________________  Post-Procedure Discharge Instructions  Instructions: Apply ice:  Purpose: This will minimize any swelling and discomfort after procedure.  When: Day of procedure, as soon as you get home. How: Fill a plastic sandwich bag with crushed ice. Cover it with a small towel and apply to injection site. How long: (15 min on, 15 min off) Apply for 15 minutes then remove x 15 minutes.  Repeat sequence on day of procedure, until you go to bed. Apply heat:  Purpose: To treat any soreness and discomfort from the procedure. When: Starting the next day after the procedure. How: Apply heat to procedure site starting the day following the procedure. How long: May continue to repeat daily, until discomfort goes away. Food intake: Start with clear liquids (like water) and advance to regular food, as tolerated.  Physical activities: Keep activities to a minimum for the first 8 hours after the procedure. After that, then as tolerated. Driving: If you have received any sedation, be responsible and do not drive. You are not allowed to drive for 24 hours after having sedation. Blood thinner: (Applies only to those taking blood thinners) You may restart your blood thinner 6 hours after your procedure. Insulin: (Applies only to Diabetic patients taking insulin) As soon as you can eat, you may resume your normal dosing schedule. Infection prevention: Keep procedure site clean and dry. Shower daily and clean area with soap and water. Post-procedure Pain Diary: Extremely important that this be done correctly and accurately. Recorded information will be used to determine the next step in treatment. For the purpose of accuracy, follow these rules: Evaluate only the area treated. Do not report or include pain from an untreated area. For the purpose of this evaluation, ignore all other areas of pain, except for the treated  area. After your procedure, avoid taking a long nap and attempting to complete the pain diary after you wake up. Instead, set your alarm clock to go off every hour, on the hour, for the initial 8 hours after the procedure. Document the duration of the numbing medicine, and the relief you are getting from it. Do not go to sleep and attempt to complete it later. It will not be accurate. If you received sedation, it is likely that you were given a medication that may cause amnesia. Because of this, completing the diary at a later time may cause the information to be inaccurate. This information is needed to plan your care. Follow-up appointment: Keep your post-procedure follow-up evaluation appointment after the procedure (usually 2 weeks for most procedures, 6 weeks for radiofrequencies). DO NOT FORGET to bring you pain diary with you.   Expect: (What should I expect to see with my procedure?) From numbing medicine (AKA: Local Anesthetics): Numbness or decrease in pain. You may also experience some weakness, which if present, could last for the duration of the local anesthetic. Onset: Full effect within 15 minutes of injected. Duration: It will depend on the type of local anesthetic used. On the average, 1 to 8 hours.  From steroids (Applies only if steroids were used): Decrease in swelling or inflammation. Once inflammation is improved, relief of the pain will follow. Onset of benefits: Depends on the amount of swelling present. The more swelling, the longer it will take for the benefits to be seen. In some cases, up to 10 days. Duration: Steroids will stay in the system x 2 weeks. Duration of benefits will depend on multiple posibilities including persistent irritating factors. Side-effects: If present, they   may typically last 2 weeks (the duration of the steroids). Frequent: Cramps (if they occur, drink Gatorade and take over-the-counter Magnesium 450-500 mg once to twice a day); water retention with  temporary weight gain; increases in blood sugar; decreased immune system response; increased appetite. Occasional: Facial flushing (red, warm cheeks); mood swings; menstrual changes. Uncommon: Long-term decrease or suppression of natural hormones; bone thinning. (These are more common with higher doses or more frequent use. This is why we prefer that our patients avoid having any injection therapies in other practices.)  Very Rare: Severe mood changes; psychosis; aseptic necrosis. From procedure: Some discomfort is to be expected once the numbing medicine wears off. This should be minimal if ice and heat are applied as instructed.  Call if: (When should I call?) You experience numbness and weakness that gets worse with time, as opposed to wearing off. New onset bowel or bladder incontinence. (Applies only to procedures done in the spine)  Emergency Numbers: Durning business hours (Monday - Thursday, 8:00 AM - 4:00 PM) (Friday, 9:00 AM - 12:00 Noon): [phone] After hours: [phone] NOTE: If you are having a problem and are unable connect with, or to talk to a provider, then go to your nearest urgent care or emergency department. If the problem is serious and urgent, please call 911. ____________________________________________________________________________________________   _________________________________________________________________________________________  Body mass index (BMI) Weight Management Required  URGENT: Dear Danielle [name] your weight has been found to be adversely affecting your health. Aggressive action is immediately required. Talk to your primary care physician.  Body mass index (BMI) is a common tool for deciding whether a person has an appropriate body weight.  It measures a persons weight in relation to their height.   According to the [facility] of health (NIH): A BMI of less than 18.5 means that a person is underweight. A BMI of between 18.5 and  24.9 is ideal. A BMI of between 25 and 29.9 is overweight. A BMI over 30 indicates obesity.  Body Mass Index (BMI) Classification BMI level (kg/m2) Category Associated incidence of chronic pain  <18  Underweight   18.5-24.9 Ideal body weight   25-29.9 Overweight  20%  30-34.9 Obese (Class I)  68%  35-39.9 Severe obesity (Class II)  136%  >40 Extreme obesity (Class III)  254%   Your current Estimated body mass index is 41.6 kg/m as calculated from the following:   Height as of 12/19/22: 5\' 5"  (1.651 m).   Weight as of 12/19/22: 250 lb (113.4 kg).  Morbidly Obese Classification: You will be considered to be "Morbidly Obese" if your BMI is above 30 and you have one or more of the following conditions caused or associated to obesity: 1.    Type 2 Diabetes (Leading to cardiovascular diseases (CVD), stroke, peripheral vascular diseases (PVD), retinopathy, nephropathy, and neuropathy) 2.    Cardiovascular Disease (High Blood Pressure; Congestive Heart Failure; High Cholesterol; Coronary Artery Disease; Angina; Arrhythmias, Dysrhythmias, or Heart Attacks) 3.    Breathing problems (Asthma; obesity-hypoventilation syndrome; obstructive sleep apnea; chronic inflammatory airway disease; reactive airway disease; or shortness of breath) 4.    Chronic kidney disease 5.    Liver disease (nonalcoholic fatty liver disease) 6.    High blood pressure 7.    Acid reflux (gastroesophageal reflux disease; heartburn) 8.    Osteoarthritis (OA) (affecting the hip(s), the knee(s) and/or the lower back) 9.    Low back pain (Lumbar Facet Syndrome; and/or Degenerative Disc Disease) 10.  Hip pain (  Osteoarthritis of hip) (For every 1 lbs of added body weight, there is a 2 lbs increase in pressure inside of each hip articulation. 1:2 mechanical relationship) 11.  Knee pain (Osteoarthritis of knee) (For every 1 lbs of added body weight, there is a 4 lbs increase in pressure inside of each knee articulation. 1:4 mechanical  relationship) (patients with a BMI>30 kg/m2 were 6.8 times more likely to develop knee OA than normal-weight individuals) 12.  Cancer: Epidemiological studies have shown that obesity is a risk factor for: post-menopausal breast cancer; cancers of the endometrium, colon and kidney cancer; malignant adenomas of the oesophagus. Obese subjects have an approximately 1.5-3.5-fold increased risk of developing these cancers compared with normal-weight subjects, and it has been estimated that between 15 and 45% of these cancers can be attributed to overweight. More recent studies suggest that obesity may also increase the risk of other types of cancer, including pancreatic, hepatic and gallbladder cancer. (Ref: Obesity and cancer. Pischon T, Nthlings U, [employer] H. Proc Nutr Soc. 2008 May;67(2):128-45. doi: 10.1017/S0029665108006976.) The International Agency for Research on Cancer (IARC) has identified 13 cancers associated with overweight and obesity: meningioma, multiple myeloma, adenocarcinoma of the esophagus, and cancers of the thyroid, postmenopausal breast cancer, gallbladder, stomach, liver, pancreas, kidney, ovaries, uterus, colon and rectal (colorectal) cancers. 55 percent of all cancers diagnosed in women and 24 percent of those diagnosed in men are associated with overweight and obesity.  Recommendation: At this point it is urgent that you take a step back and concentrate in loosing weight. Dedicate 100% of your efforts on this task. Nothing else will improve your health more than bringing your weight down and your BMI to less than 30. If you are here, you probably have chronic pain. We know that most chronic pain patients have difficulty exercising secondary to their pain. For this reason, you must rely on proper nutrition and diet in order to lose the weight. If your BMI is above 40, you should seriously consider bariatric surgery. A realistic goal is to lose 10% of your body weight over a period of 12  months.  Be honest to yourself, if over time you have unsuccessfully tried to lose weight, then it is time for you to seek professional help and to enter a medically supervised weight management program, and/or undergo bariatric surgery. Stop procrastinating.   Pain management considerations:  1.    Pharmacological Problems: Be advised that the use of opioid analgesics (oxycodone; hydrocodone; morphine; methadone; codeine; and all of their derivatives) have been associated with decreased metabolism and weight gain.  For this reason, should we see that you are unable to lose weight while taking these medications, it may become necessary for [place] to taper down and indefinitely discontinue them.  2.    Technical Problems: The incidence of successful interventional therapies decreases as the patient's BMI increases. It is much more difficult to accomplish a safe and effective interventional therapy on a patient with a BMI above 35. 3.    Radiation Exposure Problems: The x-rays machine, used to accomplish injection therapies, will automatically increase their x-ray output in order to capture an appropriate bone image. This means that radiation exposure increases exponentially with the patient's BMI. (The higher the BMI, the higher the radiation exposure.) Although the level of radiation used at a given time is still safe to the patient, it is not for the physician and/or assisting staff. Unfortunately, radiation exposure is accumulative. Because physicians and the staff have to do procedures and  be exposed on a daily basis, this can result in health problems such as cancer and radiation burns. Radiation exposure to the staff is monitored by the radiation batches that they wear. The exposure levels are reported back to the staff on a quarterly basis. Depending on levels of exposure, physicians and staff may be obligated by law to decrease this exposure. This means that they have the right and obligation to refuse  providing therapies where they may be overexposed to radiation. For this reason, physicians may decline to offer therapies such as radiofrequency ablation or implants to patients with a BMI above 40. 4.    Current Trends: Be advised that the current trend is to no longer offer certain therapies to patients with a BMI equal to, or above 35, due to increase perioperative risks, increased technical procedural difficulties, and excessive radiation exposure to healthcare personnel.  _________________________________________________________________________________________

## 2023-01-03 ENCOUNTER — Telehealth: Payer: Self-pay

## 2023-01-03 NOTE — Telephone Encounter (Signed)
Post procedure follow up.  Patient states she is doing good.  

## 2023-01-28 ENCOUNTER — Encounter: Payer: Self-pay | Admitting: Pain Medicine

## 2023-01-28 ENCOUNTER — Ambulatory Visit: Payer: Medicaid Other | Attending: Pain Medicine | Admitting: Pain Medicine

## 2023-01-28 VITALS — BP 141/98 | HR 90 | Temp 97.7°F | Ht 65.0 in | Wt 240.0 lb

## 2023-01-28 DIAGNOSIS — M25552 Pain in left hip: Secondary | ICD-10-CM | POA: Insufficient documentation

## 2023-01-28 DIAGNOSIS — Z6841 Body Mass Index (BMI) 40.0 and over, adult: Secondary | ICD-10-CM | POA: Insufficient documentation

## 2023-01-28 DIAGNOSIS — M25551 Pain in right hip: Secondary | ICD-10-CM | POA: Diagnosis not present

## 2023-01-28 DIAGNOSIS — M47816 Spondylosis without myelopathy or radiculopathy, lumbar region: Secondary | ICD-10-CM

## 2023-01-28 DIAGNOSIS — M7062 Trochanteric bursitis, left hip: Secondary | ICD-10-CM | POA: Diagnosis present

## 2023-01-28 DIAGNOSIS — M47817 Spondylosis without myelopathy or radiculopathy, lumbosacral region: Secondary | ICD-10-CM

## 2023-01-28 DIAGNOSIS — M7061 Trochanteric bursitis, right hip: Secondary | ICD-10-CM | POA: Diagnosis not present

## 2023-01-28 DIAGNOSIS — M4316 Spondylolisthesis, lumbar region: Secondary | ICD-10-CM | POA: Insufficient documentation

## 2023-01-28 DIAGNOSIS — M5459 Other low back pain: Secondary | ICD-10-CM

## 2023-01-28 DIAGNOSIS — Z09 Encounter for follow-up examination after completed treatment for conditions other than malignant neoplasm: Secondary | ICD-10-CM | POA: Diagnosis present

## 2023-01-28 DIAGNOSIS — M545 Low back pain, unspecified: Secondary | ICD-10-CM | POA: Diagnosis not present

## 2023-01-28 DIAGNOSIS — G8929 Other chronic pain: Secondary | ICD-10-CM | POA: Insufficient documentation

## 2023-01-28 MED ORDER — METHOCARBAMOL 1000 MG/10ML IJ SOLN
INTRAMUSCULAR | Status: AC
  Filled 2023-01-28: qty 10

## 2023-01-28 MED ORDER — KETOROLAC TROMETHAMINE 60 MG/2ML IM SOLN
INTRAMUSCULAR | Status: AC
  Filled 2023-01-28: qty 2

## 2023-01-28 MED ORDER — METHOCARBAMOL 1000 MG/10ML IJ SOLN
200.0000 mg | Freq: Once | INTRAMUSCULAR | Status: AC
  Administered 2023-01-28: 200 mg via INTRAMUSCULAR

## 2023-01-28 MED ORDER — KETOROLAC TROMETHAMINE 60 MG/2ML IM SOLN
60.0000 mg | Freq: Once | INTRAMUSCULAR | Status: AC
  Administered 2023-01-28: 60 mg via INTRAMUSCULAR

## 2023-01-28 NOTE — Patient Instructions (Signed)

## 2023-01-28 NOTE — Progress Notes (Addendum)
PROVIDER NOTE: Information contained herein reflects review and annotations entered in association with encounter. Interpretation of such information and data should be left to medically-trained personnel. Information provided to patient can be located elsewhere in the medical record under "Patient Instructions". Document created using STT-dictation technology, any transcriptional errors that may result from process are unintentional.    Patient: Danielle [name]  Service Category: E/M  Provider: Oswaldo Done, MD  DOB: [birth year]  DOS: 01/28/2023  Referring Provider: Jenell Milliner, MD  MRN: [id number]  Specialty: Interventional Pain Management  PCP: Jenell Milliner, MD  Type: Established Patient  Setting: Ambulatory outpatient    Location: Office  Delivery: Face-to-face     HPI  Ms. TENESHIA HEDEEN, a 62 y.o. year old female, is here today because of her Chronic pain of both hips [M25.551, M25.552, G89.29]. Ms. Iacovelli primary complain today is Back Pain (Lower right)  Pertinent problems: Ms. Luty has Cervical spinal cord compression [facility]) (April, 2017); Cervical spinal stenosis; Chronic shoulder pain (1ry area of Pain) (Bilateral) (R>L); Chronic low back pain (3ry area of Pain) (Bilateral) (R>L) w/ sciatica (Right); Lumbar facet syndrome (Bilateral) (R>L); Lumbar spondylosis; Failed back surgical syndrome; Chronic lower extremity pain (Right); Chronic neck pain (Bilateral) (R>L); Chronic cervical radicular pain (Right); Ulnar neuropathy (Left); Hx of cervical spine surgery; Cervical spondylosis; Fibromyalgia; Chronic sacroiliac pain (Bilateral) (L>R); History of total arthroplasty of shoulder (Left); Chronic pain syndrome; Complaints of weakness of lower extremity; Anterolisthesis of lumbar spine (L4/L5); Cervical facet syndrome (HCC); Chronic musculoskeletal pain; Muscle spasm of back; Myofascial pain; Chronic hip pain (2ry area of Pain) (Bilateral) (R>L); 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); Trigger point with back pain; Osteoarthritis of shoulder (Bilateral); Spondylosis without myelopathy or radiculopathy, lumbosacral region; Cellulitis of leg, right; Chronic trochanteric bursitis (Bilateral); Osteoarthritis involving multiple joints; Chronic shoulder pain after replacement; History of total shoulder replacement (Right); History of total replacement of shoulder joints (Bilateral); Leg edema, left; Spondylosis without myelopathy or radiculopathy, cervical region; Chronic hip pain (Right); Osteoarthritis of hip (Right); Chronic sacroiliac joint pain (Left); Other spondylosis, sacral and sacrococcygeal region; Sacroiliac joint dysfunction (Left); Somatic dysfunction of sacroiliac joint (Left); Enthesopathy of sacroiliac joint (Left); DDD (degenerative disc disease), cervical; DDD (degenerative disc disease), lumbosacral; Other intervertebral disc degeneration, lumbar region; Acetabular labrum tear, sequela (Left); Acetabular labrum tear, sequela (Right); Greater trochanteric bursitis of hips (Bilateral); Epidural fibrosis (Left: L5-S1); Chronic groin pain (Bilateral); Abnormal MRI, lumbar spine (09/28/2020); Abnormal MRI, hip (Bilateral) (10/04/2020); Chronic shoulder pain (Left); Cervicalgia; Chronic neck pain with history of cervical spinal surgery; Chronic hip pain (Left); Chronic groin pain (Left); Abnormal drug screen (11/21/2021); Chronic sacroiliac joint pain (Right); Chronic pain disorder; Arthralgia of hip; Trochanteric bursitis of hip (Right); Left thigh pain; Right buttock pain; Somatic dysfunction of sacroiliac joint (Right); Sacroiliac joint dysfunction (Right); Chronic low back pain (Bilateral) (R>L) w/o sciatica; Lumbar facet joint pain; Osteoarthritis of facet joint of lumbar spine; and Abnormal CT scan, cervical spine (07/18/2021) on their pertinent problem list. Pain Assessment: Severity of   is reported as a 4 /10. Location: Back Lower/radiates down right leg to calf. Onset:  More than a month ago. Quality: Constant, Dull, Sharp. Timing: Constant. Modifying factor(s): denies. Vitals:  height is 5\' 5"  (1.651 m) and weight is 240 lb (108.9 kg). Her temperature is 97.7 F (36.5 C). Her blood pressure is 141/98 (abnormal) and her pulse is 90. Her oxygen saturation is 98%.  BMI: Estimated body mass index is 39.94 kg/m as calculated from the  following:   Height as of this encounter: 5\' 5"  (1.651 m).   Weight as of this encounter: 240 lb (108.9 kg). Last encounter: 12/19/2022. Last procedure: 01/02/2023.  Reason for encounter: post-procedure evaluation and assessment. The patient indicates having attained 80% improvement of her pain for the duration of the local anesthetic followed by an ongoing 70% improvement of her hip pain.  Unfortunately, she is having a flareup of her low back pain.  Physical exam today confirms this pain to again be coming from her lumbar facet joints.  It would help significantly if we can have her bring her BMI down to 30 or below, but right now she is at 39.94 kg/m.  She is interested in having on's again treated the low back pain with the lumbar facet blocks which continue to be very effective in controlling this pain.  Unfortunately, because she has not lost the weight, she continues to aggravated and make it come back.  Patient needs to bring her weight down to a BMI of 30 or less which in her case should be bringing her weight to less than 175 pounds.  Post-procedure evaluation   Type: Hip & bursae injection  [IA Hip (R7L5) & TB (R5L3)]   Laterality: Bilateral (-50) (R7L5) Bursae: Trochanteric  Laterality: Bilateral (-50) (R5L3) Approach: Percutaneous posterolateral approach. Level: Lower pelvic and hip joint level.  Imaging: Fluoroscopy-guided         Anesthesia: Local anesthesia (1-2% Lidocaine) Anxiolysis: IV Versed 2.0 mg Sedation: Moderate Sedation Fentanyl 1 mL (50 mcg) DOS: 01/02/2023  Performed by: Oswaldo Done,  MD  Purpose: Diagnostic/Therapeutic Indications: Hip pain severe enough to impact quality of life or function. Rationale (medical necessity): procedure needed and proper for the diagnosis and/or treatment of Ms. Sluder's medical symptoms and needs. 1. Chronic hip pain (2ry area of Pain) (Bilateral) (R>L)   2. Greater trochanteric bursitis of hips (Bilateral)   3. 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)   4. Acetabular labrum tear, sequela (Left)   5. Acetabular labrum tear, sequela (Right)   6. Abnormal MRI, hip (Bilateral) (10/04/2020)    History of drug-induced anaphylaxis (Contrast Dye)    History of allergy to iodine & radiographic dye    NAS-11 Pain score:   Pre-procedure: 3 /10   Post-procedure: 3 /10 (0/10 on left, right side is still hurting)      Effectiveness:  Initial hour after procedure: 80 %. Subsequent 4-6 hours post-procedure: 80 %. Analgesia past initial 6 hours: 70 %. Ongoing improvement:  Analgesic: The patient indicates having attained 80% improvement of her pain for the duration of the local anesthetic followed by an ongoing 70% improvement of her hip pain. Function: Ms. Egge reports improvement in function ROM: Ms. Territo reports improvement in ROM  Pharmacotherapy Assessment  Analgesic: No chronic opioid analgesics therapy prescribed by our practice.  Discontinued (11/21/2021) UDS. (+) COCAINE.   Monitoring: [place] PMP: PDMP reviewed during this encounter.       Pharmacotherapy: No side-effects or adverse reactions reported. Compliance: No problems identified. Effectiveness: Clinically acceptable.  No notes on file  No results found for: "CBDTHCR" No results found for: "D8THCCBX" No results found for: "D9THCCBX"  UDS:  Summary  Date Value Ref Range Status  11/21/2021 Note  Final    Comment:    ==================================================================== [employer] [facility] [employer]  (MW) ==================================================================== Test  Result       Flag       Units  Drug Present and Declared for Prescription Verification   Oxycodone                      >2433        EXPECTED   ng/mg creat   Oxymorphone                    164          EXPECTED   ng/mg creat   Noroxycodone                   >2433        EXPECTED   ng/mg creat   Noroxymorphone                 58           EXPECTED   ng/mg creat    Sources of oxycodone are scheduled prescription medications.    Oxymorphone, noroxycodone, and noroxymorphone are expected    metabolites of oxycodone. Oxymorphone is also available as a    scheduled prescription medication.  Drug Present not Declared for Prescription Verification   Methamphetamine                12           UNEXPECTED ng/mg creat    Sources of methamphetamine include illicit sources, as a scheduled    prescription medication, as a metabolite of some prescription drugs,    or use of an l-methamphetamine inhaler.    Oxazepam                       17           UNEXPECTED ng/mg creat    Oxazepam may be administered as a scheduled prescription medication;    it is also an expected metabolite of other benzodiazepine drugs,    including diazepam, chlordiazepoxide, prazepam, clorazepate,    halazepam, and temazepam.    Benzoylecgonine                38           UNEXPECTED ng/mg creat    Benzoylecgonine is a metabolite of cocaine; its presence indicates    use of this drug.  Source is most commonly illicit, but cocaine is    present in some topical anesthetic solutions.  ==================================================================== Test                      Result    Flag   Units      Ref Range   Creatinine              411              mg/dL      >=40 ==================================================================== Declared Medications:  The flagging and interpretation on this report are based  on the  following declared medications.  Unexpected results may arise from  inaccuracies in the declared medications.   **Note: The testing scope of this panel includes these medications:   Oxycodone   **Note: The testing scope of this panel does not include the  following reported medications:   Albuterol  Azelastine  Cetirizine  Citalopram (Celexa)  Diclofenac (Voltaren)  Docusate (Colace)  Fluticasone (Trelegy)  Guaifenesin (Mucinex)  Levothyroxine (Synthroid)  Naloxone  Nystatin  Supplement  Tizanidine (Zanaflex)  Umeclidinium (Trelegy)  Vilanterol (Trelegy)  Vitamin D ==================================================================== For clinical consultation, please call [phone]. ====================================================================       ROS  Constitutional: Denies any fever or chills Gastrointestinal: No reported hemesis, hematochezia, vomiting, or acute GI distress Musculoskeletal: Denies any acute onset joint swelling, redness, loss of ROM, or weakness Neurological: No reported episodes of acute onset apraxia, aphasia, dysarthria, agnosia, amnesia, paralysis, loss of coordination, or loss of consciousness  Medication Review  Azelastine HCl, albuterol, budesonide-formoterol, cetirizine, cholecalciferol, citalopram, cyclobenzaprine, diclofenac Sodium, docusate sodium, levothyroxine, magnesium hydroxide, nystatin, and tiotropium  History Review  Allergy: Ms. Harlacher is allergic to aripiprazole, duloxetine, gabapentin, gold, iodinated contrast media, naproxen sodium, nortriptyline hcl, nsaids, pregabalin, meperidine, ace inhibitors, aspirin, cefpodoxime, fluoxetine, fluticasone-salmeterol, levofloxacin, lithium, paroxetine, tape, telithromycin, theophylline, topiramate, trazodone, triamcinolone, venlafaxine, bupropion, ketorolac tromethamine, morphine, and moxifloxacin. Drug: Ms. Gamarra  reports no history of drug use. Alcohol:  reports no  history of alcohol use. Tobacco:  reports that she has been smoking cigarettes. She has a 80 pack-year smoking history. She has never used smokeless tobacco. Social: Ms. Butrick  reports that she has been smoking cigarettes. She has a 80 pack-year smoking history. She has never used smokeless tobacco. She reports that she does not drink alcohol and does not use drugs. Medical:  has a past medical history of Acute postoperative pain (01/13/2017), Anxiety, Arthritis, Asthma, Bell's palsy, Bursitis, COPD (chronic obstructive pulmonary disease) (HCC), Depression, Fibromyalgia, Heart murmur, Hepatitis C, Hiatal hernia, Hyperlipidemia, Hypertension, IBS (irritable bowel syndrome), Insomnia, and Thyroid disease. Surgical: Ms. Bares  has a past surgical history that includes necksurgery (10/21/2014); Back surgery (2013); Foot surgery (Right); Elbow surgery (Left); Carpal tunnel release (Right); Nose surgery; Partial hysterectomy; Total shoulder replacement (Bilateral); and Colonoscopy with propofol (N/A, 02/05/2017). Family: family history includes Cancer in her mother; Gout in her mother; Heart disease in her father.  Laboratory Chemistry Profile   Renal Lab Results  Component Value Date   BUN 14 04/28/2019   CREATININE 0.98 04/28/2019   BCR 13 02/16/2019   GFRAA >60 04/28/2019   GFRNONAA >60 04/28/2019    Hepatic Lab Results  Component Value Date   AST 18 04/28/2019   ALT 16 04/28/2019   ALBUMIN 3.7 04/28/2019   ALKPHOS 80 04/28/2019    Electrolytes Lab Results  Component Value Date   NA 139 04/28/2019   K 4.1 04/28/2019   CL 105 04/28/2019   CALCIUM 9.3 04/28/2019   MG 2.1 02/16/2019    Bone Lab Results  Component Value Date   25OHVITD1 27 (L) 02/16/2019   25OHVITD2 2.0 02/16/2019   25OHVITD3 25 02/16/2019    Inflammation (CRP: Acute Phase) (ESR: Chronic Phase) Lab Results  Component Value Date   CRP 2 02/16/2019   ESRSEDRATE 23 02/16/2019         Note: Above Lab results  reviewed.  Recent Imaging Review  DG HIPS BILAT WITH PELVIS MIN 5 VIEWS CLINICAL DATA:  Worsening chronic bilateral hip pain.  EXAM: DG HIP (WITH OR WITHOUT PELVIS) 5+V BILAT  COMPARISON:  Bilateral hip MRI 10/03/2020, radiograph 05/23/2021  FINDINGS: Mild left hip joint space narrowing. There is bilateral acetabular spurring. No acute fracture. No erosion or evidence of a vascular necrosis. Calcification adjacent to the greater trochanters. Left greater trochanteric calcification has diminished from 2021 radiograph. Bony pelvis including pubic rami are intact. The pubic symphysis and sacroiliac joints are congruent. Multiple pelvic phleboliths.  IMPRESSION: 1. Bilateral hip osteoarthritis. 2. Calcification adjacent to both greater trochanters. This has  diminished on the left from 2021 exam.  Electronically Signed   By: Narda Rutherford M.D.   On: 01/03/2023 16:32 Note: Reviewed        Physical Exam  General appearance: Well nourished, well developed, and well hydrated. In no apparent acute distress Mental status: Alert, oriented x 3 (person, place, & time)       Respiratory: No evidence of acute respiratory distress Eyes: PERLA Vitals: BP (!) 141/98   Pulse 90   Temp 97.7 F (36.5 C)   Ht 5\' 5"  (1.651 m)   Wt 240 lb (108.9 kg)   SpO2 98%   BMI 39.94 kg/m  BMI: Estimated body mass index is 39.94 kg/m as calculated from the following:   Height as of this encounter: 5\' 5"  (1.651 m).   Weight as of this encounter: 240 lb (108.9 kg). Ideal: Ideal body weight: 57 kg (125 lb 10.6 oz) Adjusted ideal body weight: 77.7 kg (171 lb 6.4 oz)  Assessment   Diagnosis Status  1. Chronic hip pain (2ry area of Pain) (Bilateral) (R>L)   2. Greater trochanteric bursitis of hips (Bilateral)   3. Chronic low back pain (Bilateral) (R>L) w/o sciatica   4. Anterolisthesis of lumbar spine (L4/L5)   5. Postop check   6. Acute exacerbation of chronic low back pain   7. Morbid obesity  with BMI of 40.0-44.9, adult (HCC)    Controlled Controlled Controlled   Updated Problems: No problems updated.  Plan of Care  Problem-specific:  No problem-specific Assessment & Plan notes found for this encounter.  Ms. VESSIE OLMSTED has a current medication list which includes the following long-term medication(s): albuterol, azelastine hcl, citalopram, citalopram, diclofenac sodium, levothyroxine, and symbicort.  Pharmacotherapy (Medications Ordered): Meds ordered this encounter  Medications   ketorolac (TORADOL) injection 60 mg   methocarbamol (ROBAXIN) injection 200 mg   Orders:  Orders Placed This Encounter  Procedures   Amb Ref to Medical Weight Management    Referral Priority:   Routine    Referral Type:   Consultation    Referral Reason:   Specialty Services Required    Number of Visits Requested:   1   Amb Referral to Bariatric Surgery    Referral Priority:   Routine    Referral Type:   Consultation    Referral Reason:   Specialty Services Required    Number of Visits Requested:   1   Amb Ref to Medical Weight Management    Referral Priority:   Routine    Referral Type:   Consultation    Referral Reason:   Specialty Services Required    Number of Visits Requested:   1   Nursing Instructions:    Please complete this patient's postprocedure evaluation.    Scheduling Instructions:     Please complete this patient's postprocedure evaluation.   Informed Consent Details: Physician/Practitioner Attestation; Transcribe to consent form and obtain patient signature    Do not administer NSAIDs (Toradol, etc.) if patient has an allergy or intolerance to NSAIDs or if patient has CKD (chronic kidney disease or failure). Avoid the use of Muscle Relaxants (orphenedrine/Norflex, etc.) if patient has allergy or intolerance to this medication.    Scheduling Instructions:     Nursing orders: Complete pain questionnaire before administration of therapy. Document location,  laterality, onset, level, trigger, and current medications taken for the pain.    Order Specific Question:   Physician/Practitioner attestation of informed consent for procedure/surgical case    Answer:  I, the physician/practitioner, attest that I have discussed with the patient the benefits, risks, side effects, alternatives, likelihood of achieving goals and potential problems during recovery for the procedure that I have provided informed consent.    Order Specific Question:   Procedure    Answer:   IM injection of therapeutic substance    Order Specific Question:   Physician/Practitioner performing the procedure    Answer:   Torben Soloway A. Laban Emperor, MD    Order Specific Question:   Indication/Reason    Answer:   Acute on chronic pain   Follow-up plan:   Return for (ECT): (B) L-FCT Blk #5.      Interventional Therapies  Risk Factors  Considerations:   Allergy (Anaphylactic-type): Contrast dye.  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 (+) COCAINE.   Planned  Pending:   Therapeutic bilateral IA hip joint injection + trochanteric bursa injection.  Therapeutic left suprascapular nerve RFA #2    Under consideration:   Diagnostic cervical spinal cord stimulator trial  Diagnostic caudal ESI + epidurogram  Diagnostic right CESI  Diagnostic/therapeutic right cervical facet MBB #2 Diagnostic left SI Blk    Completed:   Palliative left trochanteric bursa x2 (09/18/2021) (100/100/90)  Palliative right trochanteric bursa x4 (09/17/2022) (100/100/90/70)  Palliative right suprascapular NB x3 (08/08/2020) (50/50/100/90-100)   Palliative left suprascapular NB x4 (02/22/2021) (100/100/90/90)  Palliative bilateral suprascapular nerve RFA x1 (05/13/2019) (100/100/90/90-100)  Therapeutic right IA hip joint injection x6 (09/17/2022) (100/100/70/70)  Therapeutic left IA hip  joint injection x4 (09/18/2021) (100/100/90)  Diagnostic bilateral lumbar facet MBB x4 (09/17/2022) (100/100/70/70)  Palliative right lumbar facet RFA x2 (12/16/2017) (100/100/85)  Palliative left lumbar facet RFA x2 (02/12/2018) [facility])  Diagnostic right cervical facet MBB x2 (04/11/2022) (100/100/100/100)  Diagnostic left cervical facet MBB x1 (09/30/2016) (90/100/0)  Diagnostic right SI joint block x1 (06/12/2017) (DNKFU -0/0/80)    Therapeutic  Palliative (PRN) options:   Therapeutic trochanteric bursa   Therapeutic suprascapular NB   Therapeutic suprascapular nerve RFA   Therapeutic IA hip joint injection   Therapeutic lumbar facet MBB   Therapeutic lumbar facet RFA   Therapeutic SI Blk     Pharmacotherapy  Nonopioids transferred 05/15/2020: Zanaflex and Voltaren gel       Recent Visits Date Type Provider Dept  01/28/23 Office Visit Delano Metz, MD [facility]  01/02/23 Procedure visit Delano Metz, MD [facility]  12/19/22 Office Visit Delano Metz, MD [facility]  11/07/22 Procedure visit Delano Metz, MD [facility]  Showing recent visits within past 90 days and meeting all other requirements Future Appointments No visits were found meeting these conditions. Showing future appointments within next 90 days and meeting all other requirements  I discussed the assessment and treatment plan with the patient. The patient was provided an opportunity to ask questions and all were answered. The patient agreed with the plan and demonstrated an understanding of the instructions.  Patient advised to call back or seek an in-person evaluation if the symptoms or condition worsens.  Duration of encounter: 30 minutes.  Total time on encounter, as per AMA guidelines included both the face-to-face and non-face-to-face time personally spent by the physician and/or other qualified health care professional(s) on the day of the  encounter (includes time in activities that require the physician or other qualified health care professional and does not include time  in activities normally performed by clinical staff). Physician's time may include the following activities when performed: Preparing to see the patient (e.g., pre-charting review of records, searching for previously ordered imaging, lab work, and nerve conduction tests) Review of prior analgesic pharmacotherapies. Reviewing PMP Interpreting ordered tests (e.g., lab work, imaging, nerve conduction tests) Performing post-procedure evaluations, including interpretation of diagnostic procedures Obtaining and/or reviewing separately obtained history Performing a medically appropriate examination and/or evaluation Counseling and educating the patient/family/caregiver Ordering medications, tests, or procedures Referring and communicating with other health care professionals (when not separately reported) Documenting clinical information in the electronic or other health record Independently interpreting results (not separately reported) and communicating results to the patient/ family/caregiver Care coordination (not separately reported)  Note by: Oswaldo Done, MD Date: 01/28/2023; Time: 9:38 AM

## 2023-02-12 ENCOUNTER — Telehealth: Payer: Self-pay

## 2023-02-12 NOTE — Telephone Encounter (Addendum)
Patient states she is supposed to be having a procedure but there are no orders in. Can you place an order for the procedure you want. She said low back and hip pain.

## 2023-02-12 NOTE — Addendum Note (Signed)
Addended by: Delano Metz A on: 02/12/2023 03:43 PM   Modules accepted: Orders

## 2023-03-10 NOTE — Progress Notes (Unsigned)
PROVIDER NOTE: Interpretation of information contained herein should be left to medically-trained personnel. Specific patient instructions are provided elsewhere under "Patient Instructions" section of medical record. This document was created in part using STT-dictation technology, any transcriptional errors that may result from this process are unintentional.  Patient: Danielle [name] Type: Established DOB: Dec 07, 1960 MRN: [id number] PCP: Jenell Milliner, MD  Service: Procedure DOS: 03/11/2023 Setting: Ambulatory Location: Ambulatory outpatient facility Delivery: Face-to-face Provider: Oswaldo Done, MD Specialty: Interventional Pain Management Specialty designation: 09 Location: Outpatient facility Ref. Prov.: Jenell Milliner, MD       Interventional Therapy   Procedure: Lumbar Facet, Medial Branch Block(s) #5  Laterality: Bilateral  Level: L2, L3, L4, L5, and S1 Medial Branch Level(s). Injecting these levels blocks the L3-4, L4-5, and L5-S1 lumbar facet joints. Imaging: Fluoroscopic guidance         Anesthesia: Local anesthesia (1-2% Lidocaine) Anxiolysis: IV Versed 4.0 mg Sedation: Moderate Sedation Fentanyl 2.0 mL (100 mcg) DOS: 03/11/2023 Performed by: Oswaldo Done, MD  Primary Purpose: Diagnostic/Therapeutic Indications: Low back pain severe enough to impact quality of life or function. 1. Chronic low back pain (Bilateral) (R>L) w/o sciatica   2. Lumbar facet joint pain   3. Lumbar facet syndrome (Bilateral) (R>L)   4. Spondylosis without myelopathy or radiculopathy, lumbosacral region   5. Osteoarthritis of facet joint of lumbar spine   6. Failed back surgical syndrome   7. DDD (degenerative disc disease), lumbosacral   8. Morbid obesity with BMI of 40.0-44.9, adult (HCC)    NAS-11 Pain score:   Pre-procedure: 3 /10   Post-procedure: 0-No pain/10   Note: Patient comes in today indicating a flare-up of the low back pain as well as the right hip and right  groin. Unfortunately she has not been able to loose any weight as we have instructed. She also continues to smoke and this combination is speeding up her osteoarthritic degeneration. Last set of x-rays show bilateral hip osteoarthritis as well as calcification of both greater trochanters. Prior hip MRIs done in 2022 showed degenerative labral tears.   Position / Prep / Materials:  Position: Prone  Prep solution: DuraPrep (Iodine Povacrylex [0.7% available iodine] and Isopropyl Alcohol, 74% w/w) Area Prepped: Posterolateral Lumbosacral Spine (Wide prep: From the lower border of the scapula down to the end of the tailbone and from flank to flank.)  Materials:  Tray: Block Needle(s):  Type: Spinal  Gauge (G): 22  Length: 5-in Qty: 4      H&P (Pre-op Assessment):  Danielle [name] is a 62 y.o. (year old), female patient, seen today for interventional treatment. She  has a past surgical history that includes necksurgery (10/21/2014); Back surgery (2013); Foot surgery (Right); Elbow surgery (Left); Carpal tunnel release (Right); Nose surgery; Partial hysterectomy; Total shoulder replacement (Bilateral); and Colonoscopy with propofol (N/A, 02/05/2017). Danielle [name] has a current medication list which includes the following prescription(s): albuterol, azelastine hcl, cetirizine, cholecalciferol, citalopram, cyclobenzaprine, docusate sodium, levothyroxine, magnesium hydroxide, nystatin, spiriva handihaler, symbicort, citalopram, and diclofenac sodium, and the following Facility-Administered Medications: fentanyl and lactated ringers. Her primarily concern today is the Back Pain  Initial Vital Signs:  Pulse/HCG Rate: 96ECG Heart Rate: 96 (NSR) Temp: 98 F (36.7 C) Resp: 18 BP: (!) 144/103 SpO2: 98 %  BMI: Estimated body mass index is 41.93 kg/m as calculated from the following:   Height as of this encounter: 5\' 5"  (1.651 m).   Weight as of this encounter: 252 lb (114.3 kg).  Risk  Assessment:  Allergies: Reviewed. She is allergic to aripiprazole, duloxetine, gabapentin, gold, iodinated contrast media, naproxen sodium, nortriptyline hcl, nsaids, pregabalin, meperidine, ace inhibitors, aspirin, cefpodoxime, fluoxetine, fluticasone-salmeterol, levofloxacin, lithium, paroxetine, tape, telithromycin, theophylline, topiramate, trazodone, triamcinolone, venlafaxine, bupropion, ketorolac tromethamine, morphine, and moxifloxacin.  Allergy Precautions: None required Coagulopathies: Reviewed. None identified.  Blood-thinner therapy: None at this time Active Infection(s): Reviewed. None identified. Ms. Carraro is afebrile  Site Confirmation: Ms. Yeaton was asked to confirm the procedure and laterality before marking the site Procedure checklist: Completed Consent: Before the procedure and under the influence of no sedative(s), amnesic(s), or anxiolytics, the patient was informed of the treatment options, risks and possible complications. To fulfill our ethical and legal obligations, as recommended by the American Medical Association's Code of Ethics, I have informed the patient of my clinical impression; the nature and purpose of the treatment or procedure; the risks, benefits, and possible complications of the intervention; the alternatives, including doing nothing; the risk(s) and benefit(s) of the alternative treatment(s) or procedure(s); and the risk(s) and benefit(s) of doing nothing. The patient was provided information about the general risks and possible complications associated with the procedure. These may include, but are not limited to: failure to achieve desired goals, infection, bleeding, organ or nerve damage, allergic reactions, paralysis, and death. In addition, the patient was informed of those risks and complications associated to Spine-related procedures, such as failure to decrease pain; infection (i.e.: Meningitis, epidural or intraspinal abscess); bleeding (i.e.:  epidural hematoma, subarachnoid hemorrhage, or any other type of intraspinal or peri-dural bleeding); organ or nerve damage (i.e.: Any type of peripheral nerve, nerve root, or spinal cord injury) with subsequent damage to sensory, motor, and/or autonomic systems, resulting in permanent pain, numbness, and/or weakness of one or several areas of the body; allergic reactions; (i.e.: anaphylactic reaction); and/or death. Furthermore, the patient was informed of those risks and complications associated with the medications. These include, but are not limited to: allergic reactions (i.e.: anaphylactic or anaphylactoid reaction(s)); adrenal axis suppression; blood sugar elevation that in diabetics may result in ketoacidosis or comma; water retention that in patients with history of congestive heart failure may result in shortness of breath, pulmonary edema, and decompensation with resultant heart failure; weight gain; swelling or edema; medication-induced neural toxicity; particulate matter embolism and blood vessel occlusion with resultant organ, and/or nervous system infarction; and/or aseptic necrosis of one or more joints. Finally, the patient was informed that Medicine is not an exact science; therefore, there is also the possibility of unforeseen or unpredictable risks and/or possible complications that may result in a catastrophic outcome. The patient indicated having understood very clearly. We have given the patient no guarantees and we have made no promises. Enough time was given to the patient to ask questions, all of which were answered to the patient's satisfaction. Ms. Beitel has indicated that she wanted to continue with the procedure. Attestation: I, the ordering provider, attest that I have discussed with the patient the benefits, risks, side-effects, alternatives, likelihood of achieving goals, and potential problems during recovery for the procedure that I have provided informed consent. Date   Time: 03/11/2023  9:08 AM   Pre-Procedure Preparation:  Monitoring: As per clinic protocol. Respiration, ETCO2, SpO2, BP, heart rate and rhythm monitor placed and checked for adequate function Safety Precautions: Patient was assessed for positional comfort and pressure points before starting the procedure. Time-out: I initiated and conducted the "Time-out" before starting the procedure, as per protocol. The patient was asked to participate by confirming the  accuracy of the "Time Out" information. Verification of the correct person, site, and procedure were performed and confirmed by me, the nursing staff, and the patient. "Time-out" conducted as per Joint Commission's Universal Protocol (UP.01.01.01). Time: 1123 Start Time: 1123 hrs.  Description of Procedure:          Laterality: (see above) Targeted Levels: (see above)  Safety Precautions: Aspiration looking for blood return was conducted prior to all injections. At no point did we inject any substances, as a needle was being advanced. Before injecting, the patient was told to immediately notify me if she was experiencing any new onset of "ringing in the ears, or metallic taste in the mouth". No attempts were made at seeking any paresthesias. Safe injection practices and needle disposal techniques used. Medications properly checked for expiration dates. SDV (single dose vial) medications used. After the completion of the procedure, all disposable equipment used was discarded in the proper designated medical waste containers. Local Anesthesia: Protocol guidelines were followed. The patient was positioned over the fluoroscopy table. The area was prepped in the usual manner. The time-out was completed. The target area was identified using fluoroscopy. A 12-in long, straight, sterile hemostat was used with fluoroscopic guidance to locate the targets for each level blocked. Once located, the skin was marked with an approved surgical skin marker. Once all  sites were marked, the skin (epidermis, dermis, and hypodermis), as well as deeper tissues (fat, connective tissue and muscle) were infiltrated with a small amount of a short-acting local anesthetic, loaded on a 10cc syringe with a 25G, 1.5-in  Needle. An appropriate amount of time was allowed for local anesthetics to take effect before proceeding to the next step. Local Anesthetic: Lidocaine 2.0% The unused portion of the local anesthetic was discarded in the proper designated containers. Technical description of process:  L2 Medial Branch Nerve Block (MBB): The target area for the L2 medial branch is at the junction of the postero-lateral aspect of the superior articular process and the superior, posterior, and medial edge of the transverse process of L3. Under fluoroscopic guidance, a Quincke needle was inserted until contact was made with os over the superior postero-lateral aspect of the pedicular shadow (target area). After negative aspiration for blood, 0.5 mL of the nerve block solution was injected without difficulty or complication. The needle was removed intact. L3 Medial Branch Nerve Block (MBB): The target area for the L3 medial branch is at the junction of the postero-lateral aspect of the superior articular process and the superior, posterior, and medial edge of the transverse process of L4. Under fluoroscopic guidance, a Quincke needle was inserted until contact was made with os over the superior postero-lateral aspect of the pedicular shadow (target area). After negative aspiration for blood, 0.5 mL of the nerve block solution was injected without difficulty or complication. The needle was removed intact. L4 Medial Branch Nerve Block (MBB): The target area for the L4 medial branch is at the junction of the postero-lateral aspect of the superior articular process and the superior, posterior, and medial edge of the transverse process of L5. Under fluoroscopic guidance, a Quincke needle was  inserted until contact was made with os over the superior postero-lateral aspect of the pedicular shadow (target area). After negative aspiration for blood, 0.5 mL of the nerve block solution was injected without difficulty or complication. The needle was removed intact. L5 Medial Branch Nerve Block (MBB): The target area for the L5 medial branch is at the junction of the postero-lateral  aspect of the superior articular process and the superior, posterior, and medial edge of the sacral ala. Under fluoroscopic guidance, a Quincke needle was inserted until contact was made with os over the superior postero-lateral aspect of the pedicular shadow (target area). After negative aspiration for blood, 0.5 mL of the nerve block solution was injected without difficulty or complication. The needle was removed intact. S1 Medial Branch Nerve Block (MBB): The target area for the S1 medial branch is at the posterior and inferior 6 o'clock position of the L5-S1 facet joint. Under fluoroscopic guidance, the Quincke needle inserted for the L5 MBB was redirected until contact was made with os over the inferior and postero aspect of the sacrum, at the 6 o' clock position under the L5-S1 facet joint (Target area). After negative aspiration for blood, 0.5 mL of the nerve block solution was injected without difficulty or complication. The needle was removed intact.  Once the entire procedure was completed, the treated area was cleaned, making sure to leave some of the prepping solution back to take advantage of its long term bactericidal properties.         Illustration of the posterior view of the lumbar spine and the posterior neural structures. Laminae of L2 through S1 are labeled. DPRL5, dorsal primary ramus of L5; DPRS1, dorsal primary ramus of S1; DPR3, dorsal primary ramus of L3; FJ, facet (zygapophyseal) joint L3-L4; I, inferior articular process of L4; LB1, lateral branch of dorsal primary ramus of L1; IAB, inferior  articular branches from L3 medial branch (supplies L4-L5 facet joint); IBP, intermediate branch plexus; MB3, medial branch of dorsal primary ramus of L3; NR3, third lumbar nerve root; S, superior articular process of L5; SAB, superior articular branches from L4 (supplies L4-5 facet joint also); TP3, transverse process of L3.   Facet Joint Innervation (* possible contribution)  L1-2 T12, L1 (L2*)  Medial Branch  L2-3 L1, L2 (L3*)         "          "  L3-4 L2, L3 (L4*)         "          "  L4-5 L3, L4 (L5*)         "          "  L5-S1 L4, L5, S1          "          "    Vitals:   03/11/23 1131 03/11/23 1141 03/11/23 1151 03/11/23 1201  BP: (!) 126/98 (!) 119/58 (!) 129/108 137/76  Pulse:      Resp: 16 16 16 13   Temp:      TempSrc:      SpO2: 99% 92% 92% 92%  Weight:      Height:         End Time: 1130 hrs.  Imaging Guidance (Spinal):          Type of Imaging Technique: Fluoroscopy Guidance (Spinal) Indication(s): Assistance in needle guidance and placement for procedures requiring needle placement in or near specific anatomical locations not easily accessible without such assistance. Exposure Time: Please see nurses notes. Contrast: None used. Fluoroscopic Guidance: I was personally present during the use of fluoroscopy. "Tunnel Vision Technique" used to obtain the best possible view of the target area. Parallax error corrected before commencing the procedure. "Direction-depth-direction" technique used to introduce the needle under continuous pulsed fluoroscopy. Once target was reached, antero-posterior, oblique, and lateral fluoroscopic projection  used confirm needle placement in all planes. Images permanently stored in EMR. Interpretation: No contrast injected. I personally interpreted the imaging intraoperatively. Adequate needle placement confirmed in multiple planes. Permanent images saved into the patient's record.  Post-operative Assessment:  Post-procedure Vital Signs:   Pulse/HCG Rate: 9293 Temp: 98 F (36.7 C) Resp: 13 BP: 137/76 SpO2: 92 %  EBL: None  Complications: No immediate post-treatment complications observed by team, or reported by patient.  Note: The patient tolerated the entire procedure well. A repeat set of vitals were taken after the procedure and the patient was kept under observation following institutional policy, for this type of procedure. Post-procedural neurological assessment was performed, showing return to baseline, prior to discharge. The patient was provided with post-procedure discharge instructions, including a section on how to identify potential problems. Should any problems arise concerning this procedure, the patient was given instructions to immediately contact us, at any time, without hesitation. In any case, we plan to contact the patient by telephone for a follow-up status report regarding this interventional procedure.  Comments:  No additional relevant information.  Plan of Care (POC)  Orders:  Orders Placed This Encounter  Procedures   LUMBAR FACET(MEDIAL BRANCH NERVE BLOCK) MBNB    Scheduling Instructions:     Procedure: Lumbar facet block (AKA.: Lumbosacral medial branch nerve block)     Side: Bilateral     Level: L3-4, L4-5, L5-S1, and TBD Facets (L2, L3, L4, L5, S1, and TBD Medial Branch Nerves)     Sedation: Patient's choice.     Timeframe: Today    Order Specific Question:   Where will this procedure be performed?    Answer:   [facility]   DG PAIN [facility] C-ARM 1-60 MIN NO REPORT    Intraoperative interpretation by procedural physician at [facility].    Standing Status:   Standing    Number of Occurrences:   1    Order Specific Question:   Reason for exam:    Answer:   Assistance in needle guidance and placement for procedures requiring needle placement in or near specific anatomical locations not easily accessible without such assistance.   Informed Consent Details:  Physician/Practitioner Attestation; Transcribe to consent form and obtain patient signature    Nursing Order: Transcribe to consent form and obtain patient signature. Note: Always confirm laterality of pain with Ms. Neale Burly, before procedure.    Order Specific Question:   Physician/Practitioner attestation of informed consent for procedure/surgical case    Answer:   I, the physician/practitioner, attest that I have discussed with the patient the benefits, risks, side effects, alternatives, likelihood of achieving goals and potential problems during recovery for the procedure that I have provided informed consent.    Order Specific Question:   Procedure    Answer:   Lumbar Facet Block  under fluoroscopic guidance    Order Specific Question:   Physician/Practitioner performing the procedure    Answer:   Karene Bracken A. Laban Emperor MD    Order Specific Question:   Indication/Reason    Answer:   Low Back Pain, with our without leg pain, due to Facet Joint Arthralgia (Joint Pain) Spondylosis (Arthritis of the Spine), without myelopathy or radiculopathy (Nerve Damage).   Provide equipment / supplies at bedside    Procedure tray: "Block Tray" (Disposable  single use) Skin infiltration needle: Regular 1.5-in, 25-G, (x1) Block Needle type: Spinal Amount/quantity: 4 Size: Medium (5-inch) Gauge: 22G    Standing Status:   Standing  Number of Occurrences:   1    Order Specific Question:   Specify    Answer:   Block Tray   Miscellanous precautions    Standing Status:   Standing    Number of Occurrences:   1   Chronic Opioid Analgesic:  No chronic opioid analgesics therapy prescribed by our practice.  Discontinued (11/21/2021) UDS. (+) COCAINE.   Medications ordered for procedure: Meds ordered this encounter  Medications   lidocaine (XYLOCAINE) 2 % (with pres) injection 400 mg   pentafluoroprop-tetrafluoroeth (GEBAUERS) aerosol   lactated ringers infusion   midazolam (VERSED) 5 MG/5ML injection 0.5-2  mg    Make sure Flumazenil is available in the pyxis when using this medication. If oversedation occurs, administer 0.2 mg IV over 15 sec. If after 45 sec no response, administer 0.2 mg again over 1 min; may repeat at 1 min intervals; not to exceed 4 doses (1 mg)   fentaNYL (SUBLIMAZE) injection 25-50 mcg    Make sure Narcan is available in the pyxis when using this medication. In the event of respiratory depression (RR< 8/min): Titrate NARCAN (naloxone) in increments of 0.1 to 0.2 mg IV at 2-3 minute intervals, until desired degree of reversal.   ropivacaine (PF) 2 mg/mL (0.2%) (NAROPIN) injection 18 mL   triamcinolone acetonide (KENALOG-40) injection 80 mg   Medications administered: We administered lidocaine, pentafluoroprop-tetrafluoroeth, lactated ringers, midazolam, fentaNYL, ropivacaine (PF) 2 mg/mL (0.2%), and triamcinolone acetonide.  See the medical record for exact dosing, route, and time of administration.  Follow-up plan:   Return in about 2 weeks (around 03/25/2023) for (Face2F), (PPE).       Interventional Therapies  Risk Factors  Considerations:   Allergy (Anaphylactic-type): Contrast dye.  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 (+) COCAINE.   Planned  Pending:   Therapeutic bilateral IA hip joint injection + trochanteric bursa injection.  Therapeutic left suprascapular nerve RFA #2    Under consideration:   Diagnostic cervical spinal cord stimulator trial  Diagnostic caudal ESI + epidurogram  Diagnostic right CESI  Diagnostic/therapeutic right cervical facet MBB #2 Diagnostic left SI Blk    Completed:   Palliative left trochanteric bursa x2 (09/18/2021) (100/100/90)  Palliative right trochanteric bursa x4 (09/17/2022) (100/100/90/70)  Palliative right suprascapular NB x3 (08/08/2020) (50/50/100/90-100)   Palliative left suprascapular NB  x4 (02/22/2021) (100/100/90/90)  Palliative bilateral suprascapular nerve RFA x1 (05/13/2019) (100/100/90/90-100)  Therapeutic right IA hip joint injection x6 (09/17/2022) (100/100/70/70)  Therapeutic left IA hip joint injection x4 (09/18/2021) (100/100/90)  Diagnostic bilateral lumbar facet MBB x4 (09/17/2022) (100/100/70/70)  Palliative right lumbar facet RFA x2 (12/16/2017) (100/100/85)  Palliative left lumbar facet RFA x2 (02/12/2018) [facility])  Diagnostic right cervical facet MBB x2 (04/11/2022) (100/100/100/100)  Diagnostic left cervical facet MBB x1 (09/30/2016) (90/100/0)  Diagnostic right SI joint block x1 (06/12/2017) (DNKFU -0/0/80)    Therapeutic  Palliative (PRN) options:   Therapeutic trochanteric bursa   Therapeutic suprascapular NB   Therapeutic suprascapular nerve RFA   Therapeutic IA hip joint injection   Therapeutic lumbar facet MBB   Therapeutic lumbar facet RFA   Therapeutic SI Blk     Pharmacotherapy  Nonopioids transferred 05/15/2020: Zanaflex and Voltaren gel        Recent Visits Date Type Provider Dept  01/28/23 Office Visit Delano Metz, MD [facility]  01/02/23 Procedure visit Delano Metz, MD [facility]  12/19/22 Office Visit Delano Metz, MD [facility]  Showing recent visits within past 90 days and meeting all other requirements Today's Visits Date Type Provider Dept  03/11/23 Procedure visit Delano Metz, MD [facility]  Showing today's visits and meeting all other requirements Future Appointments Date Type Provider Dept  04/01/23 Appointment Delano Metz, MD [facility]  Showing future appointments within next 90 days and meeting all other requirements  Disposition: Discharge home  Discharge (Date  Time): 03/11/2023; 1225 hrs.   Primary Care Physician: Jenell Milliner, MD Location: [facility] Note by: Oswaldo Done, MD (TTS  technology used. I apologize for any typographical errors that were not detected and corrected.) Date: 03/11/2023; Time: 12:25 PM  Disclaimer:  Medicine is not an Visual merchandiser. The only guarantee in medicine is that nothing is guaranteed. It is important to note that the decision to proceed with this intervention was based on the information collected from the patient. The Data and conclusions were drawn from the patient's questionnaire, the interview, and the physical examination. Because the information was provided in large part by the patient, it cannot be guaranteed that it has not been purposely or unconsciously manipulated. Every effort has been made to obtain as much relevant data as possible for this evaluation. It is important to note that the conclusions that lead to this procedure are derived in large part from the available data. Always take into account that the treatment will also be dependent on availability of resources and existing treatment guidelines, considered by other Pain Management Practitioners as being common knowledge and practice, at the time of the intervention. For Medico-Legal purposes, it is also important to point out that variation in procedural techniques and pharmacological choices are the acceptable norm. The indications, contraindications, technique, and results of the above procedure should only be interpreted and judged by a Board-Certified Interventional Pain Specialist with extensive familiarity and expertise in the same exact procedure and technique.

## 2023-03-11 ENCOUNTER — Encounter: Payer: Self-pay | Admitting: Pain Medicine

## 2023-03-11 ENCOUNTER — Ambulatory Visit: Payer: Medicaid Other | Attending: Pain Medicine | Admitting: Pain Medicine

## 2023-03-11 ENCOUNTER — Ambulatory Visit
Admission: RE | Admit: 2023-03-11 | Discharge: 2023-03-11 | Disposition: A | Discharge status: Home / Self Care | Payer: Medicaid Other | Source: Ambulatory Visit | Attending: Pain Medicine | Admitting: Pain Medicine

## 2023-03-11 VITALS — BP 137/76 | HR 92 | Temp 98.0°F | Resp 13 | Ht 65.0 in | Wt 252.0 lb

## 2023-03-11 DIAGNOSIS — Z91041 Radiographic dye allergy status: Secondary | ICD-10-CM | POA: Diagnosis present

## 2023-03-11 DIAGNOSIS — Z6841 Body Mass Index (BMI) 40.0 and over, adult: Secondary | ICD-10-CM | POA: Insufficient documentation

## 2023-03-11 DIAGNOSIS — M47817 Spondylosis without myelopathy or radiculopathy, lumbosacral region: Secondary | ICD-10-CM | POA: Diagnosis present

## 2023-03-11 DIAGNOSIS — M545 Low back pain, unspecified: Secondary | ICD-10-CM | POA: Insufficient documentation

## 2023-03-11 DIAGNOSIS — G8929 Other chronic pain: Secondary | ICD-10-CM | POA: Insufficient documentation

## 2023-03-11 DIAGNOSIS — M5137 Other intervertebral disc degeneration, lumbosacral region: Secondary | ICD-10-CM | POA: Insufficient documentation

## 2023-03-11 DIAGNOSIS — M47816 Spondylosis without myelopathy or radiculopathy, lumbar region: Secondary | ICD-10-CM | POA: Diagnosis present

## 2023-03-11 DIAGNOSIS — M5459 Other low back pain: Secondary | ICD-10-CM | POA: Diagnosis present

## 2023-03-11 DIAGNOSIS — Z87892 Personal history of anaphylaxis: Secondary | ICD-10-CM | POA: Diagnosis present

## 2023-03-11 DIAGNOSIS — M47818 Spondylosis without myelopathy or radiculopathy, sacral and sacrococcygeal region: Secondary | ICD-10-CM

## 2023-03-11 DIAGNOSIS — M961 Postlaminectomy syndrome, not elsewhere classified: Secondary | ICD-10-CM | POA: Insufficient documentation

## 2023-03-11 MED ORDER — ROPIVACAINE HCL 2 MG/ML IJ SOLN
INTRAMUSCULAR | Status: AC
  Filled 2023-03-11: qty 20

## 2023-03-11 MED ORDER — FENTANYL CITRATE (PF) 100 MCG/2ML IJ SOLN
25.0000 ug | INTRAMUSCULAR | Status: DC | PRN
  Administered 2023-03-11: 100 ug via INTRAVENOUS

## 2023-03-11 MED ORDER — FENTANYL CITRATE (PF) 100 MCG/2ML IJ SOLN
INTRAMUSCULAR | Status: AC
  Filled 2023-03-11: qty 2

## 2023-03-11 MED ORDER — LIDOCAINE HCL 2 % IJ SOLN
INTRAMUSCULAR | Status: AC
  Filled 2023-03-11: qty 20

## 2023-03-11 MED ORDER — ROPIVACAINE HCL 2 MG/ML IJ SOLN
18.0000 mL | Freq: Once | INTRAMUSCULAR | Status: AC
  Administered 2023-03-11: 18 mL via PERINEURAL

## 2023-03-11 MED ORDER — PENTAFLUOROPROP-TETRAFLUOROETH EX AERO
INHALATION_SPRAY | Freq: Once | CUTANEOUS | Status: AC
  Administered 2023-03-11: 30 via TOPICAL
  Filled 2023-03-11: qty 30

## 2023-03-11 MED ORDER — TRIAMCINOLONE ACETONIDE 40 MG/ML IJ SUSP
INTRAMUSCULAR | Status: AC
  Filled 2023-03-11: qty 2

## 2023-03-11 MED ORDER — LIDOCAINE HCL 2 % IJ SOLN
20.0000 mL | Freq: Once | INTRAMUSCULAR | Status: AC
  Administered 2023-03-11: 400 mg

## 2023-03-11 MED ORDER — LACTATED RINGERS IV SOLN: Freq: Once | INTRAVENOUS | Status: AC

## 2023-03-11 MED ORDER — MIDAZOLAM HCL 5 MG/5ML IJ SOLN
0.5000 mg | Freq: Once | INTRAMUSCULAR | Status: AC
  Administered 2023-03-11: 4 mg via INTRAVENOUS

## 2023-03-11 MED ORDER — TRIAMCINOLONE ACETONIDE 40 MG/ML IJ SUSP
80.0000 mg | Freq: Once | INTRAMUSCULAR | Status: AC
  Administered 2023-03-11: 80 mg

## 2023-03-11 MED ORDER — MIDAZOLAM HCL 5 MG/5ML IJ SOLN
INTRAMUSCULAR | Status: AC
  Filled 2023-03-11: qty 5

## 2023-03-11 NOTE — Progress Notes (Signed)
Safety precautions to be maintained throughout the outpatient stay will include: orient to surroundings, keep bed in low position, maintain call bell within reach at all times, provide assistance with transfer out of bed and ambulation.  

## 2023-03-11 NOTE — Patient Instructions (Addendum)
______________________________________________________________________    Post-Procedure Discharge Instructions  Instructions: Apply ice:  Purpose: This will minimize any swelling and discomfort after procedure.  When: Day of procedure, as soon as you get home. How: Fill a plastic sandwich bag with crushed ice. Cover it with a small towel and apply to injection site. How long: (15 min on, 15 min off) Apply for 15 minutes then remove x 15 minutes.  Repeat sequence on day of procedure, until you go to bed. Apply heat:  Purpose: To treat any soreness and discomfort from the procedure. When: Starting the next day after the procedure. How: Apply heat to procedure site starting the day following the procedure. How long: May continue to repeat daily, until discomfort goes away. Food intake: Start with clear liquids (like water) and advance to regular food, as tolerated.  Physical activities: Keep activities to a minimum for the first 8 hours after the procedure. After that, then as tolerated. Driving: If you have received any sedation, be responsible and do not drive. You are not allowed to drive for 24 hours after having sedation. Blood thinner: (Applies only to those taking blood thinners) You may restart your blood thinner 6 hours after your procedure. Insulin: (Applies only to Diabetic patients taking insulin) As soon as you can eat, you may resume your normal dosing schedule. Infection prevention: Keep procedure site clean and dry. Shower daily and clean area with soap and water. Post-procedure Pain Diary: Extremely important that this be done correctly and accurately. Recorded information will be used to determine the next step in treatment. For the purpose of accuracy, follow these rules: Evaluate only the area treated. Do not report or include pain from an untreated area. For the purpose of this evaluation, ignore all other areas of pain, except for the treated area. After your procedure,  avoid taking a long nap and attempting to complete the pain diary after you wake up. Instead, set your alarm clock to go off every hour, on the hour, for the initial 8 hours after the procedure. Document the duration of the numbing medicine, and the relief you are getting from it. Do not go to sleep and attempt to complete it later. It will not be accurate. If you received sedation, it is likely that you were given a medication that may cause amnesia. Because of this, completing the diary at a later time may cause the information to be inaccurate. This information is needed to plan your care. Follow-up appointment: Keep your post-procedure follow-up evaluation appointment after the procedure (usually 2 weeks for most procedures, 6 weeks for radiofrequencies). DO NOT FORGET to bring you pain diary with you.   Expect: (What should I expect to see with my procedure?) From numbing medicine (AKA: Local Anesthetics): Numbness or decrease in pain. You may also experience some weakness, which if present, could last for the duration of the local anesthetic. Onset: Full effect within 15 minutes of injected. Duration: It will depend on the type of local anesthetic used. On the average, 1 to 8 hours.  From steroids (Applies only if steroids were used): Decrease in swelling or inflammation. Once inflammation is improved, relief of the pain will follow. Onset of benefits: Depends on the amount of swelling present. The more swelling, the longer it will take for the benefits to be seen. In some cases, up to 10 days. Duration: Steroids will stay in the system x 2 weeks. Duration of benefits will depend on multiple posibilities including persistent irritating factors. Side-effects: If  present, they may typically last 2 weeks (the duration of the steroids). Frequent: Cramps (if they occur, drink Gatorade and take over-the-counter Magnesium 450-500 mg once to twice a day); water retention with temporary weight gain;  increases in blood sugar; decreased immune system response; increased appetite. Occasional: Facial flushing (red, warm cheeks); mood swings; menstrual changes. Uncommon: Long-term decrease or suppression of natural hormones; bone thinning. (These are more common with higher doses or more frequent use. This is why we prefer that our patients avoid having any injection therapies in other practices.)  Very Rare: Severe mood changes; psychosis; aseptic necrosis. From procedure: Some discomfort is to be expected once the numbing medicine wears off. This should be minimal if ice and heat are applied as instructed.  Call if: (When should I call?) You experience numbness and weakness that gets worse with time, as opposed to wearing off. New onset bowel or bladder incontinence. (Applies only to procedures done in the spine)  Emergency Numbers: Durning business hours (Monday - Thursday, 8:00 AM - 4:00 PM) (Friday, 9:00 AM - 12:00 Noon): [phone] After hours: [phone] NOTE: If you are having a problem and are unable connect with, or to talk to a provider, then go to your nearest urgent care or emergency department. If the problem is serious and urgent, please call 911. ______________________________________________________________________     ______________________________________________________________________    Body mass index (BMI) Weight Management Required  URGENT: Dear Danielle [name] your weight has been found to be adversely affecting your health. Aggressive action is immediately required. Talk to your primary care physician.  Body mass index (BMI) is a common tool for deciding whether a person has an appropriate body weight.  It measures a persons weight in relation to their height.   According to the [facility] of health (NIH): A BMI of less than 18.5 means that a person is underweight. A BMI of between 18.5 and 24.9 is ideal. A BMI of between 25 and 29.9 is  overweight. A BMI over 30 indicates obesity.  Body Mass Index (BMI) Classification BMI level (kg/m2) Category Associated incidence of chronic pain  <18  Underweight   18.5-24.9 Ideal body weight   25-29.9 Overweight  20%  30-34.9 Obese (Class I)  68%  35-39.9 Severe obesity (Class II)  136%  >40 Extreme obesity (Class III)  254%   Your current Estimated body mass index is 41.93 kg/m as calculated from the following:   Height as of this encounter: 5\' 5"  (1.651 m).   Weight as of this encounter: 252 lb (114.3 kg).  Morbidly Obese Classification: You will be considered to be "Morbidly Obese" if your BMI is above 30 and you have one or more of the following conditions caused or associated to obesity: 1.    Type 2 Diabetes (Leading to cardiovascular diseases (CVD), stroke, peripheral vascular diseases (PVD), retinopathy, nephropathy, and neuropathy) 2.    Cardiovascular Disease (High Blood Pressure; Congestive Heart Failure; High Cholesterol; Coronary Artery Disease; Angina; Arrhythmias, Dysrhythmias, or Heart Attacks) 3.    Breathing problems (Asthma; obesity-hypoventilation syndrome; obstructive sleep apnea; chronic inflammatory airway disease; reactive airway disease; or shortness of breath) 4.    Chronic kidney disease 5.    Liver disease (nonalcoholic fatty liver disease) 6.    High blood pressure 7.    Acid reflux (gastroesophageal reflux disease; heartburn) 8.    Osteoarthritis (OA) (affecting the hip(s), the knee(s) and/or the lower back) 9.    Low back pain (Lumbar Facet Syndrome;  and/or Degenerative Disc Disease) 10.  Hip pain (Osteoarthritis of hip) (For every 1 lbs of added body weight, there is a 2 lbs increase in pressure inside of each hip articulation. 1:2 mechanical relationship) 11.  Knee pain (Osteoarthritis of knee) (For every 1 lbs of added body weight, there is a 4 lbs increase in pressure inside of each knee articulation. 1:4 mechanical relationship) (patients with a  BMI>30 kg/m2 were 6.8 times more likely to develop knee OA than normal-weight individuals) 12.  Cancer: Epidemiological studies have shown that obesity is a risk factor for: post-menopausal breast cancer; cancers of the endometrium, colon and kidney cancer; malignant adenomas of the oesophagus. Obese subjects have an approximately 1.5-3.5-fold increased risk of developing these cancers compared with normal-weight subjects, and it has been estimated that between 15 and 45% of these cancers can be attributed to overweight. More recent studies suggest that obesity may also increase the risk of other types of cancer, including pancreatic, hepatic and gallbladder cancer. (Ref: Obesity and cancer. Pischon T, Nthlings U, [employer] H. Proc Nutr Soc. 2008 May;67(2):128-45. doi: 10.1017/S0029665108006976.) The International Agency for Research on Cancer (IARC) has identified 13 cancers associated with overweight and obesity: meningioma, multiple myeloma, adenocarcinoma of the esophagus, and cancers of the thyroid, postmenopausal breast cancer, gallbladder, stomach, liver, pancreas, kidney, ovaries, uterus, colon and rectal (colorectal) cancers. 55 percent of all cancers diagnosed in women and 24 percent of those diagnosed in men are associated with overweight and obesity.  Recommendation: At this point it is urgent that you take a step back and concentrate in loosing weight. Dedicate 100% of your efforts on this task. Nothing else will improve your health more than bringing your weight down and your BMI to less than 30. If you are here, you probably have chronic pain. We know that most chronic pain patients have difficulty exercising secondary to their pain. For this reason, you must rely on proper nutrition and diet in order to lose the weight. If your BMI is above 40, you should seriously consider bariatric surgery. A realistic goal is to lose 10% of your body weight over a period of 12 months.  Be honest to yourself, if  over time you have unsuccessfully tried to lose weight, then it is time for you to seek professional help and to enter a medically supervised weight management program, and/or undergo bariatric surgery. Stop procrastinating.   Pain management considerations:  1.    Pharmacological Problems: Be advised that the use of opioid analgesics (oxycodone; hydrocodone; morphine; methadone; codeine; and all of their derivatives) have been associated with decreased metabolism and weight gain.  For this reason, should we see that you are unable to lose weight while taking these medications, it may become necessary for [place] to taper down and indefinitely discontinue them.  2.    Technical Problems: The incidence of successful interventional therapies decreases as the patient's BMI increases. It is much more difficult to accomplish a safe and effective interventional therapy on a patient with a BMI above 35. 3.    Radiation Exposure Problems: The x-rays machine, used to accomplish injection therapies, will automatically increase their x-ray output in order to capture an appropriate bone image. This means that radiation exposure increases exponentially with the patient's BMI. (The higher the BMI, the higher the radiation exposure.) Although the level of radiation used at a given time is still safe to the patient, it is not for the physician and/or assisting staff. Unfortunately, radiation exposure is accumulative. Because physicians  and the staff have to do procedures and be exposed on a daily basis, this can result in health problems such as cancer and radiation burns. Radiation exposure to the staff is monitored by the radiation batches that they wear. The exposure levels are reported back to the staff on a quarterly basis. Depending on levels of exposure, physicians and staff may be obligated by law to decrease this exposure. This means that they have the right and obligation to refuse providing therapies where they may be  overexposed to radiation. For this reason, physicians may decline to offer therapies such as radiofrequency ablation or implants to patients with a BMI above 40. 4.    Current Trends: Be advised that the current trend is to no longer offer certain therapies to patients with a BMI equal to, or above 35, due to increase perioperative risks, increased technical procedural difficulties, and excessive radiation exposure to healthcare personnel.  ______________________________________________________________________

## 2023-03-12 ENCOUNTER — Telehealth: Payer: Self-pay

## 2023-03-12 NOTE — Telephone Encounter (Signed)
Post procedure follow up. Patient states she is doing fine.  

## 2023-03-25 DIAGNOSIS — M810 Age-related osteoporosis without current pathological fracture: Secondary | ICD-10-CM | POA: Insufficient documentation

## 2023-03-26 DIAGNOSIS — H0260 Xanthelasma of unspecified eye, unspecified eyelid: Secondary | ICD-10-CM | POA: Insufficient documentation

## 2023-03-31 NOTE — Progress Notes (Unsigned)
The patient called and rescheduled appointment indicating that she was sick and could not make it today.

## 2023-04-01 ENCOUNTER — Ambulatory Visit (HOSPITAL_BASED_OUTPATIENT_CLINIC_OR_DEPARTMENT_OTHER): Payer: Medicaid Other | Admitting: Pain Medicine

## 2023-04-01 DIAGNOSIS — Z09 Encounter for follow-up examination after completed treatment for conditions other than malignant neoplasm: Secondary | ICD-10-CM

## 2023-04-01 DIAGNOSIS — M47816 Spondylosis without myelopathy or radiculopathy, lumbar region: Secondary | ICD-10-CM

## 2023-04-01 DIAGNOSIS — Z91199 Patient's noncompliance with other medical treatment and regimen due to unspecified reason: Secondary | ICD-10-CM

## 2023-04-01 DIAGNOSIS — M5459 Other low back pain: Secondary | ICD-10-CM

## 2023-04-01 DIAGNOSIS — M545 Other chronic pain: Secondary | ICD-10-CM

## 2023-04-02 ENCOUNTER — Ambulatory Visit: Payer: Medicaid Other | Admitting: Orthopedic Surgery

## 2023-04-03 ENCOUNTER — Ambulatory Visit
Admission: EM | Admit: 2023-04-03 | Discharge: 2023-04-03 | Disposition: A | Discharge status: Home / Self Care | Payer: Medicaid Other | Attending: Internal Medicine | Admitting: Internal Medicine

## 2023-04-03 DIAGNOSIS — H65192 Other acute nonsuppurative otitis media, left ear: Secondary | ICD-10-CM

## 2023-04-03 MED ORDER — AMOXICILLIN-POT CLAVULANATE 875-125 MG PO TABS: 1.0000 | ORAL_TABLET | Freq: Two times a day (BID) | ORAL | 0 refills | Status: DC

## 2023-04-03 NOTE — ED Triage Notes (Signed)
Patient presents with left ear pain x day 2. No treatment used.

## 2023-04-03 NOTE — Discharge Instructions (Addendum)
You are seen today for left ear pain.  Your exam is consistent with an ear infection.  I am putting you on antibiotics twice daily for the next 10 days.  You can take ibuprofen 800 mg every 8 hours as needed.  Please follow-up if your symptoms persist or worsen.

## 2023-04-03 NOTE — ED Provider Notes (Signed)
[facility]    CSN: [id number] Arrival date & time: 04/03/23  1924      History   Chief Complaint No chief complaint on file.   HPI Danielle [name] is a 62 y.o. female presents to urgent care with complaint of left ear pain.  She reports this started 2 days ago.  She describes the pain as sore, achy, sharp and stabbing.  She has had some loss of hearing.  She denies drainage from the area.  She denies headache, runny nose, nasal congestion, sore throat, cough.  She denies fever, chills or bodyaches.  She has tried Tylenol OTC with minimal relief of symptoms.  HPI  Past Medical History:  Diagnosis Date   Acute postoperative pain 01/13/2017   Anxiety    Arthritis    Asthma    Bell's palsy    Bursitis    COPD (chronic obstructive pulmonary disease) (HCC)    Depression    Fibromyalgia    Heart murmur    Hepatitis C    Hiatal hernia    Hyperlipidemia    Hypertension    IBS (irritable bowel syndrome)    Insomnia    Thyroid disease     Patient Active Problem List   Diagnosis Date Noted   Abnormal CT scan, cervical spine (07/18/2021) 12/19/2022   Failure to attend appointment 10/01/2022   Lumbar facet joint pain 09/17/2022   Osteoarthritis of facet joint of lumbar spine 09/17/2022   Chronic low back pain (Bilateral) (R>L) w/o sciatica 08/29/2022   Somatic dysfunction of sacroiliac joint (Right) 04/25/2022   Sacroiliac joint dysfunction (Right) 04/25/2022   Right buttock pain 01/31/2022   Left thigh pain 01/21/2022   Violation of controlled substance agreement 01/21/2022   Abnormal drug screen (11/21/2021) 01/17/2022   Cocaine use 01/17/2022   Chronic hip pain (Left) 09/05/2021   Chronic groin pain (Left) 09/05/2021   Cervicalgia 06/19/2021   Chronic neck pain with history of cervical spinal surgery 06/19/2021   COPD exacerbation (HCC) 04/10/2021   Chronic shoulder pain (Left) 02/07/2021   Chronic use of opiate for therapeutic purpose 11/28/2020    Acetabular labrum tear, sequela (Left) 10/09/2020   Acetabular labrum tear, sequela (Right) 10/09/2020   Greater trochanteric bursitis of hips (Bilateral) 10/09/2020   Epidural fibrosis (Left: L5-S1) 10/09/2020   Chronic groin pain (Bilateral) 10/09/2020   Abnormal MRI, lumbar spine (09/28/2020) 10/09/2020   Abnormal MRI, hip (Bilateral) (10/04/2020) 10/09/2020   DDD (degenerative disc disease), cervical 09/11/2020   DDD (degenerative disc disease), lumbosacral 09/11/2020   Other intervertebral disc degeneration, lumbar region 09/11/2020   Chronic sacroiliac joint pain (Left) 07/06/2020   Other spondylosis, sacral and sacrococcygeal region 07/06/2020   Sacroiliac joint dysfunction (Left) 07/06/2020   Somatic dysfunction of sacroiliac joint (Left) 07/06/2020   Enthesopathy of sacroiliac joint (Left) 07/06/2020   History of multiple allergies 07/06/2020   Chronic hip pain (Right) 06/01/2020   Osteoarthritis of hip (Right) 06/01/2020   History of drug-induced anaphylaxis (Contrast Dye) 06/01/2020   Uncomplicated opioid dependence (HCC) 01/10/2020   Asthma 12/31/2019   Leg edema, left 12/31/2019   Post-nasal drip 12/01/2019   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   History of total replacement of shoulder joints (Bilateral) 10/12/2019   Chronic shoulder pain after replacement 05/13/2019   Osteoarthritis involving multiple joints 01/20/2019   Pharmacologic therapy 06/03/2018   Disorder of skeletal system 06/03/2018   Problems influencing health status 06/03/2018   Chronic trochanteric bursitis (Bilateral) 06/03/2018  Elevated sed rate 06/03/2018   Elevated vitamin B12 level 06/03/2018   Trochanteric bursitis of hip (Right) 06/03/2018   History of allergy to iodine & radiographic dye 04/30/2018   URI (upper respiratory infection) 04/08/2018   Cellulitis of leg, right 03/25/2018   Abnormal mammogram of left breast 10/06/2017   Spondylosis without myelopathy or  radiculopathy, lumbosacral region 09/24/2017   Osteoarthritis of shoulder (Bilateral) 08/19/2017   Morbid (severe) obesity due to excess calories (HCC) 07/24/2017   Chronic hip pain (2ry area of Pain) (Bilateral) (R>L) 06/11/2017   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) 06/11/2017   Trigger point with back pain 06/11/2017   Arthralgia of hip 06/11/2017   Myofascial pain 05/27/2017   Nausea 01/13/2017   Vasovagal episode 01/13/2017   Encounter for screening colonoscopy 09/12/2016   Rectal bleeding 09/12/2016   Cervical facet syndrome (HCC) 09/05/2016   Chronic musculoskeletal pain 09/05/2016   Muscle spasm of back 09/05/2016   Chronic pain syndrome 07/25/2016   Complaints of weakness of lower extremity 07/25/2016   Morbid obesity with BMI of 40.0-44.9, adult (HCC) 07/25/2016   Anterolisthesis of lumbar spine (L4/L5) 07/25/2016   Chronic pain disorder 07/25/2016   Seasonal allergic rhinitis 03/01/2016   History of total arthroplasty of shoulder (Left) 03/01/2016   Chronic sacroiliac pain (Bilateral) (L>R) 01/11/2016   Chronic sacroiliac joint pain (Right) 01/11/2016   Long term prescription opiate use 01/02/2016   Chronic shoulder pain (1ry area of Pain) (Bilateral) (R>L) 01/02/2016   Chronic low back pain (3ry area of Pain) (Bilateral) (R>L) w/ sciatica (Right) 01/02/2016   Lumbar facet syndrome (Bilateral) (R>L) 01/02/2016   Lumbar spondylosis 01/02/2016   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   Chronic lower extremity pain (Right) 01/02/2016   Chronic neck pain (Bilateral) (R>L) 01/02/2016   Chronic cervical radicular pain (Right) 01/02/2016   Ulnar neuropathy (Left) 01/02/2016   Hx of cervical spine surgery 01/02/2016   Cervical spondylosis 01/02/2016   Encounter for therapeutic drug level monitoring 01/02/2016   Encounter for pain management planning 01/02/2016   Fibromyalgia 01/02/2016   Mucositis oral 09/12/2015   COPD (chronic obstructive pulmonary disease) (HCC)  01/02/2015   Current tobacco use 01/02/2015   Mixed simple and mucopurulent chronic bronchitis (HCC) 01/02/2015   Cervical spinal cord compression (HCC) (April, 2017) 10/21/2014   Spondylosis without myelopathy or radiculopathy, cervical region 10/21/2014   Cervical spinal stenosis 05/10/2014   Adiposity 03/22/2014   B12 deficiency 02/10/2014   Hypertension 12/11/2013   Depression 12/10/2013   Hypothyroid 12/10/2013    Past Surgical History:  Procedure Laterality Date   BACK SURGERY  2013   lumbar disk   CARPAL TUNNEL RELEASE Right    COLONOSCOPY WITH PROPOFOL N/A 02/05/2017   Procedure: COLONOSCOPY WITH PROPOFOL;  Surgeon: Earline Mayotte, MD;  Location: [facility];  Service: Endoscopy;  Laterality: N/A;   ELBOW SURGERY Left    FOOT SURGERY Right    necksurgery  10/21/2014   spine neck fusion   NOSE SURGERY     PARTIAL HYSTERECTOMY     TOTAL SHOULDER REPLACEMENT Bilateral     OB History   No obstetric history on file.      Home Medications    Prior to Admission medications   Medication Sig Start Date End Date Taking? Authorizing Provider  amoxicillin-clavulanate (AUGMENTIN) 875-125 MG tablet Take 1 tablet by mouth 2 (two) times daily. 04/03/23  Yes Lorre Munroe, NP  albuterol (PROVENTIL HFA;VENTOLIN HFA) 108 (90 Base) MCG/ACT inhaler Inhale 2 puffs  into the lungs every 4 (four) hours as needed for wheezing or shortness of breath.  11/03/15   [provider]  Azelastine HCl 0.15 % SOLN Place 1 spray into the nose as needed. 09/25/21   [provider]  cetirizine (ZYRTEC) 10 MG tablet Take 10 mg by mouth at bedtime.    [provider]  cholecalciferol (VITAMIN D) 1000 units tablet Take 5,000 Units by mouth daily.     [provider]  citalopram (CELEXA) 40 MG tablet Take 40 mg by mouth daily.    [provider]  cyclobenzaprine (FLEXERIL) 10 MG tablet Take 10 mg by mouth in the morning and at bedtime. 03/19/22   [provider]  diclofenac Sodium (VOLTAREN) 1 % GEL Apply 4 g topically 4 (four) times daily. 05/15/20 01/28/23  Delano Metz, MD  docusate sodium (COLACE) 100 MG capsule Take 100 mg by mouth daily as needed.    [provider]  levothyroxine (SYNTHROID, LEVOTHROID) 50 MCG tablet TAKE 1 TABLET (50 MCG TOTAL) BY MOUTH DAILY AT 0600. 12/15/15   [provider]  magnesium hydroxide (MILK OF MAGNESIA) 400 MG/5ML suspension Take by mouth.    [provider]  nystatin (MYCOSTATIN) 100000 UNIT/ML suspension Take 5 mLs by mouth 4 (four) times daily. 01/09/23   [provider]  SPIRIVA HANDIHALER 18 MCG inhalation capsule 1 capsule daily.    [provider]  SYMBICORT 160-4.5 MCG/ACT inhaler Inhale into the lungs. 08/26/22 08/26/23  [provider]    Family History Family History  Problem Relation Age of Onset   Gout Mother    Cancer Mother    Heart disease Father     Social History Social History   Tobacco Use   Smoking status: Every Day    Current packs/day: 2.00    Average packs/day: 2.0 packs/day for 40.0 years (80.0 ttl pk-yrs)    Types: Cigarettes   Smokeless tobacco: Never   Tobacco comments:    1/2 ppd 12/08/19//lmr  Substance Use Topics   Alcohol use: No    Alcohol/week: 0.0 standard drinks of alcohol    Comment: rarely   Drug use: No     Allergies   Aripiprazole, Duloxetine, Gabapentin, Gold, Iodinated contrast media, Naproxen sodium, Nortriptyline hcl, Nsaids, Pregabalin, Meperidine, Ace inhibitors, Aspirin, Cefpodoxime, Fluoxetine, Fluticasone-salmeterol, Levofloxacin, Lithium, Paroxetine, Tape, Telithromycin, Theophylline, Topiramate, Trazodone, Triamcinolone, Venlafaxine, Bupropion, Ketorolac tromethamine, Morphine, and Moxifloxacin   Review of Systems Review of Systems  Constitutional:  Negative for chills and fever.  HENT:  Positive for ear pain and hearing loss. Negative for congestion, ear discharge,  rhinorrhea, sinus pressure, sinus pain, sore throat and tinnitus.   Respiratory:  Negative for cough.   Cardiovascular:  Negative for chest pain.  Musculoskeletal:  Negative for myalgias.  Neurological:  Negative for dizziness, weakness, light-headedness and headaches.     Physical Exam Triage Vital Signs ED Triage Vitals  Encounter Vitals Group     BP 04/03/23 1935 129/75     Systolic BP Percentile --      Diastolic BP Percentile --      Pulse Rate 04/03/23 1931 (!) 104     Resp --      Temp 04/03/23 1931 98.6 F (37 C)     Temp Source 04/03/23 1931 Oral     SpO2 04/03/23 1931 96 %     Weight 04/03/23 1933 246 lb (111.6 kg)     Height 04/03/23 1933 5\' 5"  (1.651 m)  Head Circumference --      Peak Flow --      Pain Score 04/03/23 1933 6     Pain Loc --      Pain Education --      Exclude from Growth Chart --    No data found.  Updated Vital Signs BP 129/75 (BP Location: Right Arm)   Pulse (!) 104   Temp 98.6 F (37 C) (Oral)   Ht 5\' 5"  (1.651 m)   Wt 246 lb (111.6 kg)   SpO2 96%   BMI 40.94 kg/m    Physical Exam Constitutional:      General: She is not in acute distress.    Appearance: She is obese.  HENT:     Head: Normocephalic.     Right Ear: Tympanic membrane, ear canal and external ear normal. There is no impacted cerumen.     Left Ear: External ear normal. There is no impacted cerumen.     Ears:     Comments: Left TM red and bulging, + mucoid effusion noted Cardiovascular:     Rate and Rhythm: Normal rate and regular rhythm.     Heart sounds: Normal heart sounds.  Pulmonary:     Effort: Pulmonary effort is normal.     Breath sounds: Normal breath sounds. No wheezing, rhonchi or rales.  Musculoskeletal:     Cervical back: Neck supple.  Lymphadenopathy:     Cervical: No cervical adenopathy.  Skin:    General: Skin is warm and dry.     Findings: No rash.  Neurological:     General: No focal deficit present.     Mental Status: She is alert and  oriented to person, place, and time.      UC Treatments / Results  Labs   EKG   Radiology No results found.  Procedures Procedures (including critical care time)  Medications Ordered in UC Medications - No data to display  Initial Impression / Assessment and Plan / UC Course  I have reviewed the triage vital signs and the nursing notes.  Pertinent labs & imaging results that were available during my care of the patient were reviewed by me and considered in my medical decision making (see chart for details).     62 year old female with 2-day history of left ear pain.  DDx include ETD, otitis externa, otitis media.  Exam consistent with otitis media.  Will treat with Augmentin 875-125 mg twice daily x 10 days.  Encourage ibuprofen 800 mg every 8 hours as needed.  Advised her to follow-up if symptoms persist or worsen.  Final Clinical Impressions(s) / UC Diagnoses   Final diagnoses:  Acute mucoid otitis media of left ear     Discharge Instructions      You are seen today for left ear pain.  Your exam is consistent with an ear infection.  I am putting you on antibiotics twice daily for the next 10 days.  You can take ibuprofen 800 mg every 8 hours as needed.  Please follow-up if your symptoms persist or worsen.     ED Prescriptions     Medication Sig Dispense Auth. Provider   amoxicillin-clavulanate (AUGMENTIN) 875-125 MG tablet Take 1 tablet by mouth 2 (two) times daily. 20 tablet Lorre Munroe, NP      PDMP not reviewed this encounter.   Lorre Munroe, NP 04/03/23 1943

## 2023-04-13 NOTE — Progress Notes (Signed)
PROVIDER NOTE: Information contained herein reflects review and annotations entered in association with encounter. Interpretation of such information and data should be left to medically-trained personnel. Information provided to patient can be located elsewhere in the medical record under "Patient Instructions". Document created using STT-dictation technology, any transcriptional errors that may result from process are unintentional.    Patient: Arna Medici  Service Category: E/M  Provider: Oswaldo Done, MD  DOB: [birth year]  DOS: 04/17/2023  Referring Provider: Jenell Milliner, MD  MRN: [id number]  Specialty: Interventional Pain Management  PCP: Jenell Milliner, MD  Type: Established Patient  Setting: Ambulatory outpatient    Location: Office  Delivery: Face-to-face     HPI  Ms. YULY ROHLFING, a 62 y.o. year old female, is here today because of her Chronic bilateral low back pain without sciatica [M54.50, G89.29]. Ms. Seery primary complain today is Back Pain (lower) and Hip Pain (right)  Pertinent problems: Ms. Levandowski has Cervical spinal cord compression [facility]) (April, 2017); Cervical spinal stenosis; Chronic shoulder pain (1ry area of Pain) (Bilateral) (R>L); Chronic low back pain (3ry area of Pain) (Bilateral) (R>L) w/ sciatica (Right); Lumbar facet syndrome (Bilateral) (R>L); Lumbar spondylosis; Failed back surgical syndrome; Chronic lower extremity pain (Right); Chronic neck pain (Bilateral) (R>L); Chronic cervical radicular pain (Right); Ulnar neuropathy (Left); Hx of cervical spine surgery; Cervical spondylosis; Fibromyalgia; Chronic sacroiliac pain (Bilateral) (L>R); History of total arthroplasty of shoulder (Left); Chronic pain syndrome; Complaints of weakness of lower extremity; Anterolisthesis of lumbar spine (L4/L5); Cervical facet syndrome (HCC); Chronic musculoskeletal pain; Muscle spasm of back; Myofascial pain; Chronic hip pain (2ry area of Pain) (Bilateral) (R>L);  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); Trigger point with back pain; Osteoarthritis of shoulder (Bilateral); Spondylosis without myelopathy or radiculopathy, lumbosacral region; Cellulitis of leg, right; Chronic trochanteric bursitis (Bilateral); Osteoarthritis involving multiple joints; Chronic shoulder pain after replacement; History of total shoulder replacement (Right); History of total replacement of shoulder joints (Bilateral); Leg edema, left; Spondylosis without myelopathy or radiculopathy, cervical region; Chronic hip pain (Right); Osteoarthritis of hip (Right); Chronic sacroiliac joint pain (Left); Other spondylosis, sacral and sacrococcygeal region; Sacroiliac joint dysfunction (Left); Somatic dysfunction of sacroiliac joint (Left); Enthesopathy of sacroiliac joint (Left); DDD (degenerative disc disease), cervical; DDD (degenerative disc disease), lumbosacral; Other intervertebral disc degeneration, lumbar region; Acetabular labrum tear, sequela (Left); Acetabular labrum tear, sequela (Right); Greater trochanteric bursitis of hips (Bilateral); Epidural fibrosis (Left: L5-S1); Chronic groin pain (Bilateral); Abnormal MRI, lumbar spine (09/28/2020); Abnormal MRI, hip (Bilateral) (10/04/2020); Chronic shoulder pain (Left); Cervicalgia; Chronic neck pain with history of cervical spinal surgery; Chronic hip pain (Left); Chronic groin pain (Left); Abnormal drug screen (11/21/2021); Chronic sacroiliac joint pain (Right); Chronic pain disorder; Arthralgia of hip; Trochanteric bursitis of hip (Right); Left thigh pain; Right buttock pain; Somatic dysfunction of sacroiliac joint (Right); Sacroiliac joint dysfunction (Right); Chronic low back pain (Bilateral) (R>L) w/o sciatica; Lumbar facet joint pain; Osteoarthritis of facet joint of lumbar spine; and Abnormal CT scan, cervical spine (07/18/2021) on their pertinent problem list. Pain Assessment: Severity of Chronic pain is reported as a 3 /10. Location: Back  (right hip 8/10 will elevate when walking, moving) Lower/denies radiating down leg, patient is having right hip pain that radiates around to the groin down front of leg to above the knee. Onset: More than a month ago. Quality: Aching, Moaning ("crumbling"). Timing: Constant. Modifying factor(s): Crista Elliot, rest. Vitals:  height is 5\' 5"  (1.651 m) and weight is 250 lb (113.4 kg). Her temperature is 98.1  F (36.7 C). Her blood pressure is 101/89 and her pulse is 108 (abnormal). Her respiration is 16 and oxygen saturation is 99%.  BMI: Estimated body mass index is 41.6 kg/m as calculated from the following:   Height as of this encounter: 5\' 5"  (1.651 m).   Weight as of this encounter: 250 lb (113.4 kg). Last encounter: 04/01/2023. Last procedure: 03/11/2023.  Reason for encounter: post-procedure evaluation and assessment. The patient indicates having attained 100% relief of the pain in the area of the lower back however, on the day of the procedure she came in with a flareup of her right hip arthralgia affecting the anterior and posterior aspect of the joint with groin pain as well as pain in the trochanteric bursa.  X-rays actually do show the patient to have some calcifications of the trochanteric bursa suggesting chronic trochanteric bursitis.  MRIs of the hip done around 2022 already had shown labral tears.  The patient indicates having attained an ongoing 60% improvement of her low back pain.  Unfortunately, today her primary area of pain continues to be that of the right hip joint.  Physical exam today showed significant decreased range of motion of the right hip.  Range of motion was also very painful.  Pain was referred towards the anterior and posterior aspects of the hip as well as lateral trochanteric bursae area.  Prior hip injections have temporarily helped with this pain.  Unfortunately, she has degenerative joint disease of the right hip secondary to osteoarthritis and labral tears that were first  identified around 2022.  In addition, the patient continues to be morbidly obese with a BMI of 41.60 kg/m which further aggravates the weight bearing joints and has continued to contribute to the patient's joint degeneration.  Eventually it is very likely that she will end up requiring hip replacement.  In an ideal world I would like for this patient to bring her BMI down to or below 30.  This has been communicated to the patient several times but it has proven to be very difficult to bring her weight down.  The patient has been given referrals to weight management, a nutritionist, and bariatric surgery.  Post-procedure evaluation   Procedure: Lumbar Facet, Medial Branch Block(s) #5  Laterality: Bilateral  Level: L2, L3, L4, L5, and S1 Medial Branch Level(s). Injecting these levels blocks the L3-4, L4-5, and L5-S1 lumbar facet joints. Imaging: Fluoroscopic guidance         Anesthesia: Local anesthesia (1-2% Lidocaine) Anxiolysis: IV Versed 4.0 mg Sedation: Moderate Sedation Fentanyl 2.0 mL (100 mcg) DOS: 03/11/2023 Performed by: Oswaldo Done, MD  Primary Purpose: Diagnostic/Therapeutic Indications: Low back pain severe enough to impact quality of life or function. 1. Chronic low back pain (Bilateral) (R>L) w/o sciatica   2. Lumbar facet joint pain   3. Lumbar facet syndrome (Bilateral) (R>L)   4. Spondylosis without myelopathy or radiculopathy, lumbosacral region   5. Osteoarthritis of facet joint of lumbar spine   6. Failed back surgical syndrome   7. DDD (degenerative disc disease), lumbosacral   8. Morbid obesity with BMI of 40.0-44.9, adult (HCC)    NAS-11 Pain score:   Pre-procedure: 3 /10   Post-procedure: 0-No pain/10   Note: Patient comes in today indicating a flare-up of the low back pain as well as the right hip and right groin. Unfortunately she has not been able to loose any weight as we have instructed. She also continues to smoke and this combination is speeding up  her osteoarthritic degeneration. Last set of x-rays show bilateral hip osteoarthritis as well as calcification of both greater trochanters. Prior hip MRIs done in 2022 showed degenerative labral tears.    Effectiveness:  Initial hour after procedure: 100 %. Subsequent 4-6 hours post-procedure: 90 %. Analgesia past initial 6 hours: 60 % (current). Ongoing improvement:  Analgesic: The patient indicates having attained 100% relief of the pain in the area of the lower back however, on the day of the procedure she came in with a flareup of her right hip arthralgia.  Today she indicates having attained an ongoing 60% improvement of her bilateral low back pain and again states that her primary and current area pain is out of the right hip. Function: Ms. Platner reports improvement in function ROM: Ms. Sepe reports improvement in ROM  Pharmacotherapy Assessment  Analgesic: No chronic opioid analgesics therapy prescribed by our practice.  Discontinued (11/21/2021) UDS. (+) COCAINE.   Monitoring:  PMP: PDMP reviewed during this encounter.       Pharmacotherapy: No side-effects or adverse reactions reported. Compliance: No problems identified. Effectiveness: Clinically acceptable.  Newman Pies, RN  04/17/2023  2:22 PM  Sign when Signing Visit Safety precautions to be maintained throughout the outpatient stay will include: orient to surroundings, keep bed in low position, maintain call bell within reach at all times, provide assistance with transfer out of bed and ambulation.     No results found for: "CBDTHCR" No results found for: "D8THCCBX" No results found for: "D9THCCBX"  UDS:  Summary  Date Value Ref Range Status  11/21/2021 Note  Final    Comment:    ==================================================================== [employer] [facility] [employer] (MW) ==================================================================== Test                             Result       Flag        Units  Drug Present and Declared for Prescription Verification   Oxycodone                      >2433        EXPECTED   ng/mg creat   Oxymorphone                    164          EXPECTED   ng/mg creat   Noroxycodone                   >2433        EXPECTED   ng/mg creat   Noroxymorphone                 58           EXPECTED   ng/mg creat    Sources of oxycodone are scheduled prescription medications.    Oxymorphone, noroxycodone, and noroxymorphone are expected    metabolites of oxycodone. Oxymorphone is also available as a    scheduled prescription medication.  Drug Present not Declared for Prescription Verification   Methamphetamine                12           UNEXPECTED ng/mg creat    Sources of methamphetamine include illicit sources, as a scheduled    prescription medication, as a metabolite of some prescription drugs,    or use of an l-methamphetamine inhaler.    Oxazepam  17           UNEXPECTED ng/mg creat    Oxazepam may be administered as a scheduled prescription medication;    it is also an expected metabolite of other benzodiazepine drugs,    including diazepam, chlordiazepoxide, prazepam, clorazepate,    halazepam, and temazepam.    Benzoylecgonine                38           UNEXPECTED ng/mg creat    Benzoylecgonine is a metabolite of cocaine; its presence indicates    use of this drug.  Source is most commonly illicit, but cocaine is    present in some topical anesthetic solutions.  ==================================================================== Test                      Result    Flag   Units      Ref Range   Creatinine              411              mg/dL      >=16 ==================================================================== Declared Medications:  The flagging and interpretation on this report are based on the  following declared medications.  Unexpected results may arise from  inaccuracies in the declared medications.   **Note:  The testing scope of this panel includes these medications:   Oxycodone   **Note: The testing scope of this panel does not include the  following reported medications:   Albuterol  Azelastine  Cetirizine  Citalopram (Celexa)  Diclofenac (Voltaren)  Docusate (Colace)  Fluticasone (Trelegy)  Guaifenesin (Mucinex)  Levothyroxine (Synthroid)  Naloxone  Nystatin  Supplement  Tizanidine (Zanaflex)  Umeclidinium (Trelegy)  Vilanterol (Trelegy)  Vitamin D ==================================================================== For clinical consultation, please call [phone]. ====================================================================       ROS  Constitutional: Denies any fever or chills Gastrointestinal: No reported hemesis, hematochezia, vomiting, or acute GI distress Musculoskeletal: Denies any acute onset joint swelling, redness, loss of ROM, or weakness Neurological: No reported episodes of acute onset apraxia, aphasia, dysarthria, agnosia, amnesia, paralysis, loss of coordination, or loss of consciousness  Medication Review  Azelastine HCl, albuterol, amoxicillin-clavulanate, budesonide-formoterol, calcium carbonate, cetirizine, cholecalciferol, citalopram, cyclobenzaprine, diclofenac Sodium, docusate sodium, fluconazole, levothyroxine, magnesium hydroxide, methylPREDNISolone, nystatin, and tiotropium  History Review  Allergy: Ms. Luttrell is allergic to aripiprazole, duloxetine, gabapentin, gold, iodinated contrast media, naproxen sodium, nortriptyline hcl, nsaids, pregabalin, meperidine, ace inhibitors, aspirin, cefpodoxime, fluoxetine, fluticasone-salmeterol, levofloxacin, lithium, paroxetine, tape, telithromycin, theophylline, topiramate, trazodone, triamcinolone, venlafaxine, bupropion, ketorolac tromethamine, morphine, and moxifloxacin. Drug: Ms. Solesbee  reports no history of drug use. Alcohol:  reports no history of alcohol use. Tobacco:  reports that she has  been smoking cigarettes. She has a 80 pack-year smoking history. She has never used smokeless tobacco. Social: Ms. Hobby  reports that she has been smoking cigarettes. She has a 80 pack-year smoking history. She has never used smokeless tobacco. She reports that she does not drink alcohol and does not use drugs. Medical:  has a past medical history of Acute postoperative pain (01/13/2017), Anxiety, Arthritis, Asthma, Bell's palsy, Bursitis, COPD (chronic obstructive pulmonary disease) (HCC), Depression, Fibromyalgia, Heart murmur, Hepatitis C, Hiatal hernia, Hyperlipidemia, Hypertension, IBS (irritable bowel syndrome), Insomnia, and Thyroid disease. Surgical: Ms. Tortorich  has a past surgical history that includes necksurgery (10/21/2014); Back surgery (2013); Foot surgery (Right); Elbow surgery (Left); Carpal tunnel release (Right); Nose surgery; Partial hysterectomy; Total shoulder replacement (Bilateral); and  Colonoscopy with propofol (N/A, 02/05/2017). Family: family history includes Cancer in her mother; Gout in her mother; Heart disease in her father.  Laboratory Chemistry Profile   Renal Lab Results  Component Value Date   BUN 14 04/28/2019   CREATININE 0.98 04/28/2019   BCR 13 02/16/2019   GFRAA >60 04/28/2019   GFRNONAA >60 04/28/2019    Hepatic Lab Results  Component Value Date   AST 18 04/28/2019   ALT 16 04/28/2019   ALBUMIN 3.7 04/28/2019   ALKPHOS 80 04/28/2019    Electrolytes Lab Results  Component Value Date   NA 139 04/28/2019   K 4.1 04/28/2019   CL 105 04/28/2019   CALCIUM 9.3 04/28/2019   MG 2.1 02/16/2019    Bone Lab Results  Component Value Date   25OHVITD1 27 (L) 02/16/2019   25OHVITD2 2.0 02/16/2019   25OHVITD3 25 02/16/2019    Inflammation (CRP: Acute Phase) (ESR: Chronic Phase) Lab Results  Component Value Date   CRP 2 02/16/2019   ESRSEDRATE 23 02/16/2019         Note: Above Lab results reviewed.  Recent Imaging Review  DG PAIN CLINIC C-ARM  1-60 MIN NO REPORT Fluoro was used, but no Radiologist interpretation will be provided.  Please refer to "NOTES" tab for provider progress note. Note: Reviewed        Physical Exam  General appearance: Well nourished, well developed, and well hydrated. In no apparent acute distress Mental status: Alert, oriented x 3 (person, place, & time)       Respiratory: No evidence of acute respiratory distress Eyes: PERLA Vitals: BP 101/89   Pulse (!) 108   Temp 98.1 F (36.7 C)   Resp 16   Ht 5\' 5"  (1.651 m)   Wt 250 lb (113.4 kg)   SpO2 99%   BMI 41.60 kg/m  BMI: Estimated body mass index is 41.6 kg/m as calculated from the following:   Height as of this encounter: 5\' 5"  (1.651 m).   Weight as of this encounter: 250 lb (113.4 kg). Ideal: Ideal body weight: 57 kg (125 lb 10.6 oz) Adjusted ideal body weight: 79.6 kg (175 lb 6.4 oz)  Assessment   Diagnosis Status  1. Chronic low back pain (Bilateral) (R>L) w/o sciatica   2. Lumbar facet joint pain   3. Lumbar facet syndrome (Bilateral) (R>L)   4. Postop check   5. Abnormal MRI, hip (Bilateral) (10/04/2020)   6. Chronic groin pain (Bilateral)   7. Chronic hip pain (Right)   8. Greater trochanteric bursitis of hips (Bilateral)   9. 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)   10. Osteoarthritis of hip (Right)    Controlled Controlled Controlled   Updated Problems: No problems updated.  Plan of Care  Problem-specific:  No problem-specific Assessment & Plan notes found for this encounter.  Ms. CHIZUKO CHINEN has a current medication list which includes the following long-term medication(s): albuterol, azelastine hcl, citalopram, levothyroxine, symbicort, and diclofenac sodium.  Pharmacotherapy (Medications Ordered): No orders of the defined types were placed in this encounter.  Orders:  Orders Placed This Encounter  Procedures   MR HIP RIGHT WO CONTRAST    Standing Status:   Future    Standing Expiration Date:   07/18/2023     Scheduling Instructions:     Please make sure that the patient understands that this needs to be done as soon as possible. Never have the patient do the imaging "just before the next appointment". Inform patient that  having the imaging done within the [facility] will expedite the availability of the results and will provide      imaging availability to the requesting physician. In addition inform the patient that the imaging order has an expiration date and will not be renewed if not done within the active period.    Order Specific Question:   What is the patient's sedation requirement?    Answer:   No Sedation    Order Specific Question:   Does the patient have a pacemaker or implanted devices?    Answer:   No    Order Specific Question:   Preferred imaging location?    Answer:   [facility] (table limit-350lbs)    Order Specific Question:   Call Results- Best Contact Number?    Answer:   [phone] [facility]    Order Specific Question:   Radiology Contrast Protocol - do NOT remove file path    Answer:   [email or web address]   Ambulatory referral to Orthopedics    Referral Priority:   Routine    Referral Type:   Consultation    Referral Reason:   Specialty Services Required    Number of Visits Requested:   1   Nursing Instructions:    Please complete this patient's postprocedure evaluation.    Scheduling Instructions:     Please complete this patient's postprocedure evaluation.   Follow-up plan:   Return for Eval-day (M,W), (F2F), for review of ordered tests (after hip MRI).      Interventional Therapies  Risk Factors  Considerations:   Allergy (Anaphylactic-type): Contrast dye.  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 (+) COCAINE.   Planned  Pending:    Therapeutic bilateral IA hip joint injection + trochanteric bursa injection.  Therapeutic left suprascapular nerve RFA #2    Under consideration:   Diagnostic cervical spinal cord stimulator trial  Diagnostic caudal ESI + epidurogram  Diagnostic right CESI  Diagnostic/therapeutic right cervical facet MBB #2 Diagnostic left SI Blk    Completed:   Palliative left trochanteric bursa x2 (09/18/2021) (100/100/90)  Palliative right trochanteric bursa x4 (09/17/2022) (100/100/90/70)  Palliative right suprascapular NB x3 (08/08/2020) (50/50/100/90-100)   Palliative left suprascapular NB x4 (02/22/2021) (100/100/90/90)  Palliative bilateral suprascapular nerve RFA x1 (05/13/2019) (100/100/90/90-100)  Therapeutic right IA hip joint injection x6 (09/17/2022) (100/100/70/70)  Therapeutic left IA hip joint injection x4 (09/18/2021) (100/100/90)  Diagnostic bilateral lumbar facet MBB x4 (09/17/2022) (100/100/70/70)  Palliative right lumbar facet RFA x2 (12/16/2017) (100/100/85)  Palliative left lumbar facet RFA x2 (02/12/2018) [facility])  Diagnostic right cervical facet MBB x2 (04/11/2022) (100/100/100/100)  Diagnostic left cervical facet MBB x1 (09/30/2016) (90/100/0)  Diagnostic right SI joint block x1 (06/12/2017) (DNKFU -0/0/80)    Therapeutic  Palliative (PRN) options:   Therapeutic trochanteric bursa   Therapeutic suprascapular NB   Therapeutic suprascapular nerve RFA   Therapeutic IA hip joint injection   Therapeutic lumbar facet MBB   Therapeutic lumbar facet RFA   Therapeutic SI Blk     Pharmacotherapy  Nonopioids transferred 05/15/2020: Zanaflex and Voltaren gel         Recent Visits Date Type Provider Dept  04/17/23 Office Visit Delano Metz, MD [facility]  03/11/23 Procedure visit Delano Metz, MD [facility]  01/28/23 Office Visit Delano Metz, MD [facility]  Showing recent visits within past 90 days and meeting all other  requirements Future Appointments No visits were found meeting these conditions. Showing future appointments within next 90 days and meeting all other requirements  I discussed the assessment and treatment plan with the patient. The patient was provided an opportunity to ask questions and all were answered. The patient agreed with the plan and demonstrated an understanding of the instructions.  Patient advised to call back or seek an in-person evaluation if the symptoms or condition worsens.  Duration of encounter: 35 minutes.  Total time on encounter, as per AMA guidelines included both the face-to-face and non-face-to-face time personally spent by the physician and/or other qualified health care professional(s) on the day of the encounter (includes time in activities that require the physician or other qualified health care professional and does not include time in activities normally performed by clinical staff). Physician's time may include the following activities when performed: Preparing to see the patient (e.g., pre-charting review of records, searching for previously ordered imaging, lab work, and nerve conduction tests) Review of prior analgesic pharmacotherapies. Reviewing PMP Interpreting ordered tests (e.g., lab work, imaging, nerve conduction tests) Performing post-procedure evaluations, including interpretation of diagnostic procedures Obtaining and/or reviewing separately obtained history Performing a medically appropriate examination and/or evaluation Counseling and educating the patient/family/caregiver Ordering medications, tests, or procedures Referring and communicating with other health care professionals (when not separately reported) Documenting clinical information in the electronic or other health record Independently interpreting results (not separately reported) and communicating results to the patient/ family/caregiver Care coordination (not separately  reported)  Note by: Oswaldo Done, MD Date: 04/17/2023; Time: 5:47 AM

## 2023-04-17 ENCOUNTER — Ambulatory Visit: Payer: Medicaid Other | Attending: Pain Medicine | Admitting: Pain Medicine

## 2023-04-17 ENCOUNTER — Encounter: Payer: Self-pay | Admitting: Pain Medicine

## 2023-04-17 VITALS — BP 101/89 | HR 108 | Temp 98.1°F | Resp 16 | Ht 65.0 in | Wt 250.0 lb

## 2023-04-17 DIAGNOSIS — M25551 Pain in right hip: Secondary | ICD-10-CM | POA: Diagnosis present

## 2023-04-17 DIAGNOSIS — R935 Abnormal findings on diagnostic imaging of other abdominal regions, including retroperitoneum: Secondary | ICD-10-CM | POA: Insufficient documentation

## 2023-04-17 DIAGNOSIS — R1032 Left lower quadrant pain: Secondary | ICD-10-CM | POA: Insufficient documentation

## 2023-04-17 DIAGNOSIS — M1611 Unilateral primary osteoarthritis, right hip: Secondary | ICD-10-CM | POA: Insufficient documentation

## 2023-04-17 DIAGNOSIS — G8929 Other chronic pain: Secondary | ICD-10-CM | POA: Diagnosis present

## 2023-04-17 DIAGNOSIS — Z09 Encounter for follow-up examination after completed treatment for conditions other than malignant neoplasm: Secondary | ICD-10-CM | POA: Diagnosis present

## 2023-04-17 DIAGNOSIS — M7061 Trochanteric bursitis, right hip: Secondary | ICD-10-CM | POA: Diagnosis present

## 2023-04-17 DIAGNOSIS — M545 Low back pain, unspecified: Secondary | ICD-10-CM | POA: Insufficient documentation

## 2023-04-17 DIAGNOSIS — M5459 Other low back pain: Secondary | ICD-10-CM | POA: Diagnosis present

## 2023-04-17 DIAGNOSIS — M160 Bilateral primary osteoarthritis of hip: Secondary | ICD-10-CM | POA: Insufficient documentation

## 2023-04-17 DIAGNOSIS — R1031 Right lower quadrant pain: Secondary | ICD-10-CM | POA: Insufficient documentation

## 2023-04-17 DIAGNOSIS — M47816 Spondylosis without myelopathy or radiculopathy, lumbar region: Secondary | ICD-10-CM | POA: Diagnosis present

## 2023-04-17 DIAGNOSIS — M7062 Trochanteric bursitis, left hip: Secondary | ICD-10-CM | POA: Insufficient documentation

## 2023-04-17 NOTE — Patient Instructions (Signed)
______________________________________________________________________    OTC Supplements:   The following is a list of over-the-counter (OTC) supplements that have been found to have [facility] [employer]) studies suggesting that they may be of some benefits when used in moderation in some chronic pain-related conditions.  NOTE:  Always consult with your primary care provider and/or pharmacist before taking any OTC medications to make sure they will not interact with your current medications. Always use manufacturer's recommended dosage.  Supplement Possible benefit May be of benefit in treatment of  Active ingredient(s)  Turmeric/curcumin anti-inflammatory Joint and muscle aches and pain associated with arthritis and inflammation The main active ingredient in turmeric is curcumin.  Glucosamine/chondroitin (triple strength) may slow loss of articular cartilage Osteoarthritis glucosamine HCl and chondroitin sulfate  Vitamin D-3 may suppress release of chemicals associated with inflammation Joint and muscle aches and pain associated with arthritis and inflammation CHOLECALCIFEROL; ALPHA.-TOCOPHEROL, D  Moringa anti-inflammatory with mild analgesic effects Joint and muscle aches and pain associated with arthritis and inflammation Many:  Phenolic acids: Gallic acid, ellagic acid, ferulic acid, caffeic acid, o-coumaric acid, and chlorogenic acid. Flavonoids: Rutin, quercetin, rhamnetin, kaempferol, apigenin, and myricetin. Terpenes: Lutein, all-E-luteoxanthin, 13-Z-lutein, 15-Z-?-carotene, and all-E-zeaxanthin. Alkaloids: Marumoside A, marumoside B, and pyrrolemarumine-4?-O-?-L-rhamnopyranoside. Vitamins: Vitamin A and vitamin C. Protein: Milk protein  Melatonin Helps reset sleep cycle. Insomnia. May also be helpful in neurodegenerative disorders melatonin, also known as N-acetyl-5-methoxytryptamine  Vitamin B-12 may help keep nerves and blood cells healthy as well as maintaining function of  nervous system Neuropathies. Nerve pain (Burning pain) methylcobalamin and 5-deoxyadenosylcobalamin  Alpha-Lipoic-Acid (ALA) antioxidant that may help with nerve health, pain, and blocking the activation of some inflammatory chemicals Diabetic neuropathy and metabolic syndrome Alpha-lipoic acid (LA), or 1,2-dithiolane-3-pentanoic acid  superoxide dismutase (SOD) Currently being reviewed.  Superoxide dismutase (SOD); Copper-zinc superoxide dismutase  Tiger Balm Currently being reviewed.  Camphor; Menthol; Capsicum Extract (Capsicin); Methyl Salicylate; Essential Oils (Cassia Oil, Cajuput Oil, Clove Oil, and Dementholized Mint Oil)    ______________________________________________________________________

## 2023-04-17 NOTE — Progress Notes (Signed)
Safety precautions to be maintained throughout the outpatient stay will include: orient to surroundings, keep bed in low position, maintain call bell within reach at all times, provide assistance with transfer out of bed and ambulation.  

## 2023-04-18 ENCOUNTER — Encounter: Payer: Self-pay | Admitting: Orthopedic Surgery

## 2023-04-18 ENCOUNTER — Ambulatory Visit (INDEPENDENT_AMBULATORY_CARE_PROVIDER_SITE_OTHER): Payer: Medicaid Other | Admitting: Orthopedic Surgery

## 2023-04-18 DIAGNOSIS — G8929 Other chronic pain: Secondary | ICD-10-CM | POA: Diagnosis not present

## 2023-04-18 DIAGNOSIS — M545 Low back pain, unspecified: Secondary | ICD-10-CM | POA: Diagnosis not present

## 2023-04-18 NOTE — Progress Notes (Signed)
Office Visit Note   Patient: Danielle [name]           Date of Birth: [birth year]           MRN: [id number] Visit Date: 04/18/2023 Requested by: Jenell Milliner, MD [phone] [address] [place],  [place] [address] PCP: Jenell Milliner, MD  Subjective: Chief Complaint  Patient presents with   Right Hip - Pain    HPI: Danielle [name] is a 62 y.o. female who presents to the office reporting chronic bilateral hip pain right worse than left.  She is ambulating with a cane.  She has had "pain for years".  Reports constant pain starting in the buttock and back region radiating into the groin crease and running down the anterior part of her leg.  The pain does wake her from sleep at night.  She had plain radiographs on 12/19/2022 of this year which showed mild arthritis.  Those radiographs were reviewed.  Definitely no bone-on-bone changes.  She has had injections in her hip joints for 7 years without much relief.  She has also tried physical therapy.  She did go to her spine physician on Friday who told her there was really nothing they can do.  Patient did have spine radiographs and MRI scan done the results of which are reviewed and shows history of prior surgery with arthritis and spurring present but that was in 2022.  She states she has had a more recent MRI scan on her back but has not available for review.  She also states she has had a pelvic MRI scheduled but that also was not available for review..                ROS: All systems reviewed are negative as they relate to the chief complaint within the history of present illness.  Patient denies fevers or chills.  Assessment & Plan: Visit Diagnoses:  1. Chronic bilateral low back pain, unspecified whether sciatica present     Plan: Impression is back versus hip source of pain.  3 months ago the arthritis was mild on plain radiographs.  She does have a Trendelenburg type gait on the right-hand side but her range of motion is maintained  bilaterally.  No definite nerve root tension signs.  I think we really need the pelvic MRI scan to determine whether or not there is operative level arthritis in the hip.  She will let me know when that scan is done so we can look out for it in terms of results.  Currently her BMI is too high for elective hip replacement and that would have to be addressed prior to any type of surgery.  Would likely have one of my partners evaluate her for hip replacement.  Also recently has a diagnosis of osteoporosis and the patient does smoke.  Follow-Up Instructions: No follow-ups on file.   Orders:  No orders of the defined types were placed in this encounter.  No orders of the defined types were placed in this encounter.     Procedures: No procedures performed   Clinical Data: No additional findings.  Objective: Vital Signs: There were no vitals taken for this visit.  Physical Exam:  Constitutional: Patient appears well-developed HEENT:  Head: Normocephalic Eyes:EOM are normal Neck: Normal range of motion Cardiovascular: Normal rate Pulmonary/chest: Effort normal Neurologic: Patient is alert Skin: Skin is warm Psychiatric: Patient has normal mood and affect  Ortho Exam: Ortho exam demonstrates Trendelenburg type gait to the right.  Leg length equal.  Pedal pulses diminished bilaterally.  She does have pretty reasonable internal rotation in both hips.  5 out of 5 ankle dorsiflexion plantarflexion quad and hamstring strength but she does have some hip flexion and weakness bilaterally at 5- out of 5.  Does report radiating pain down the right calf and into the right foot at times.  Specialty Comments:  No specialty comments available.  Imaging: No results found.   PMFS History: Patient Active Problem List   Diagnosis Date Noted   Abnormal CT scan, cervical spine (07/18/2021) 12/19/2022   Failure to attend appointment 10/01/2022   Lumbar facet joint pain 09/17/2022   Osteoarthritis  of facet joint of lumbar spine 09/17/2022   Chronic low back pain (Bilateral) (R>L) w/o sciatica 08/29/2022   Somatic dysfunction of sacroiliac joint (Right) 04/25/2022   Sacroiliac joint dysfunction (Right) 04/25/2022   Right buttock pain 01/31/2022   Left thigh pain 01/21/2022   Violation of controlled substance agreement 01/21/2022   Abnormal drug screen (11/21/2021) 01/17/2022   Cocaine use 01/17/2022   Chronic hip pain (Left) 09/05/2021   Chronic groin pain (Left) 09/05/2021   Cervicalgia 06/19/2021   Chronic neck pain with history of cervical spinal surgery 06/19/2021   COPD exacerbation (HCC) 04/10/2021   Chronic shoulder pain (Left) 02/07/2021   Chronic use of opiate for therapeutic purpose 11/28/2020   Acetabular labrum tear, sequela (Left) 10/09/2020   Acetabular labrum tear, sequela (Right) 10/09/2020   Greater trochanteric bursitis of hips (Bilateral) 10/09/2020   Epidural fibrosis (Left: L5-S1) 10/09/2020   Chronic groin pain (Bilateral) 10/09/2020   Abnormal MRI, lumbar spine (09/28/2020) 10/09/2020   Abnormal MRI, hip (Bilateral) (10/04/2020) 10/09/2020   DDD (degenerative disc disease), cervical 09/11/2020   DDD (degenerative disc disease), lumbosacral 09/11/2020   Other intervertebral disc degeneration, lumbar region 09/11/2020   Chronic sacroiliac joint pain (Left) 07/06/2020   Other spondylosis, sacral and sacrococcygeal region 07/06/2020   Sacroiliac joint dysfunction (Left) 07/06/2020   Somatic dysfunction of sacroiliac joint (Left) 07/06/2020   Enthesopathy of sacroiliac joint (Left) 07/06/2020   History of multiple allergies 07/06/2020   Chronic hip pain (Right) 06/01/2020   Osteoarthritis of hip (Right) 06/01/2020   History of drug-induced anaphylaxis (Contrast Dye) 06/01/2020   Uncomplicated opioid dependence (HCC) 01/10/2020   Asthma 12/31/2019   Leg edema, left 12/31/2019   Post-nasal drip 12/01/2019   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/12/2019   History of total replacement of shoulder joints (Bilateral) 10/12/2019   Chronic shoulder pain after replacement 05/13/2019   Osteoarthritis involving multiple joints 01/20/2019   Pharmacologic therapy 06/03/2018   Disorder of skeletal system 06/03/2018   Problems influencing health status 06/03/2018   Chronic trochanteric bursitis (Bilateral) 06/03/2018   Elevated sed rate 06/03/2018   Elevated vitamin B12 level 06/03/2018   Trochanteric bursitis of hip (Right) 06/03/2018   History of allergy to iodine & radiographic dye 04/30/2018   URI (upper respiratory infection) 04/08/2018   Cellulitis of leg, right 03/25/2018   Abnormal mammogram of left breast 10/06/2017   Spondylosis without myelopathy or radiculopathy, lumbosacral region 09/24/2017   Osteoarthritis of shoulder (Bilateral) 08/19/2017   Morbid (severe) obesity due to excess calories (HCC) 07/24/2017   Chronic hip pain (2ry area of Pain) (Bilateral) (R>L) 06/11/2017   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) 06/11/2017   Trigger point with back pain 06/11/2017   Arthralgia of hip 06/11/2017   Myofascial pain 05/27/2017   Nausea 01/13/2017   Vasovagal episode 01/13/2017  Encounter for screening colonoscopy 09/12/2016   Rectal bleeding 09/12/2016   Cervical facet syndrome (HCC) 09/05/2016   Chronic musculoskeletal pain 09/05/2016   Muscle spasm of back 09/05/2016   Chronic pain syndrome 07/25/2016   Complaints of weakness of lower extremity 07/25/2016   Morbid obesity with BMI of 40.0-44.9, adult (HCC) 07/25/2016   Anterolisthesis of lumbar spine (L4/L5) 07/25/2016   Chronic pain disorder 07/25/2016   Seasonal allergic rhinitis 03/01/2016   History of total arthroplasty of shoulder (Left) 03/01/2016   Chronic sacroiliac pain (Bilateral) (L>R) 01/11/2016   Chronic sacroiliac joint pain (Right) 01/11/2016   Long term prescription opiate use 01/02/2016   Chronic shoulder pain (1ry area of Pain) (Bilateral) (R>L)  01/02/2016   Chronic low back pain (3ry area of Pain) (Bilateral) (R>L) w/ sciatica (Right) 01/02/2016   Lumbar facet syndrome (Bilateral) (R>L) 01/02/2016   Lumbar spondylosis 01/02/2016   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   Chronic lower extremity pain (Right) 01/02/2016   Chronic neck pain (Bilateral) (R>L) 01/02/2016   Chronic cervical radicular pain (Right) 01/02/2016   Ulnar neuropathy (Left) 01/02/2016   Hx of cervical spine surgery 01/02/2016   Cervical spondylosis 01/02/2016   Encounter for therapeutic drug level monitoring 01/02/2016   Encounter for pain management planning 01/02/2016   Fibromyalgia 01/02/2016   Mucositis oral 09/12/2015   COPD (chronic obstructive pulmonary disease) (HCC) 01/02/2015   Current tobacco use 01/02/2015   Mixed simple and mucopurulent chronic bronchitis (HCC) 01/02/2015   Cervical spinal cord compression (HCC) (April, 2017) 10/21/2014   Spondylosis without myelopathy or radiculopathy, cervical region 10/21/2014   Cervical spinal stenosis 05/10/2014   Adiposity 03/22/2014   B12 deficiency 02/10/2014   Hypertension 12/11/2013   Depression 12/10/2013   Hypothyroid 12/10/2013   Past Medical History:  Diagnosis Date   Acute postoperative pain 01/13/2017   Anxiety    Arthritis    Asthma    Bell's palsy    Bursitis    COPD (chronic obstructive pulmonary disease) (HCC)    Depression    Fibromyalgia    Heart murmur    Hepatitis C    Hiatal hernia    Hyperlipidemia    Hypertension    IBS (irritable bowel syndrome)    Insomnia    Thyroid disease     Family History  Problem Relation Age of Onset   Gout Mother    Cancer Mother    Heart disease Father     Past Surgical History:  Procedure Laterality Date   BACK SURGERY  2013   lumbar disk   CARPAL TUNNEL RELEASE Right    COLONOSCOPY WITH PROPOFOL N/A 02/05/2017   Procedure: COLONOSCOPY WITH PROPOFOL;  Surgeon: Earline Mayotte, MD;  Location: [facility] ENDOSCOPY;  Service:  Endoscopy;  Laterality: N/A;   ELBOW SURGERY Left    FOOT SURGERY Right    necksurgery  10/21/2014   spine neck fusion   NOSE SURGERY     PARTIAL HYSTERECTOMY     TOTAL SHOULDER REPLACEMENT Bilateral    Social History   Occupational History   Not on file  Tobacco Use   Smoking status: Every Day    Current packs/day: 2.00    Average packs/day: 2.0 packs/day for 40.0 years (80.0 ttl pk-yrs)    Types: Cigarettes   Smokeless tobacco: Never   Tobacco comments:    1/2 ppd 12/08/19//lmr  Substance and Sexual Activity   Alcohol use: No    Alcohol/week: 0.0 standard drinks of alcohol    Comment:  rarely   Drug use: No   Sexual activity: Not on file

## 2023-04-24 ENCOUNTER — Ambulatory Visit
Admission: RE | Admit: 2023-04-24 | Discharge: 2023-04-24 | Disposition: A | Discharge status: Home / Self Care | Payer: Medicaid Other | Source: Ambulatory Visit | Attending: Pain Medicine | Admitting: Pain Medicine

## 2023-04-24 DIAGNOSIS — M160 Bilateral primary osteoarthritis of hip: Secondary | ICD-10-CM | POA: Diagnosis present

## 2023-04-24 DIAGNOSIS — R1032 Left lower quadrant pain: Secondary | ICD-10-CM | POA: Diagnosis present

## 2023-04-24 DIAGNOSIS — G8929 Other chronic pain: Secondary | ICD-10-CM | POA: Diagnosis present

## 2023-04-24 DIAGNOSIS — R935 Abnormal findings on diagnostic imaging of other abdominal regions, including retroperitoneum: Secondary | ICD-10-CM | POA: Diagnosis present

## 2023-04-24 DIAGNOSIS — M25551 Pain in right hip: Secondary | ICD-10-CM | POA: Diagnosis present

## 2023-04-24 DIAGNOSIS — R1031 Right lower quadrant pain: Secondary | ICD-10-CM | POA: Diagnosis present

## 2023-04-24 DIAGNOSIS — M1611 Unilateral primary osteoarthritis, right hip: Secondary | ICD-10-CM | POA: Diagnosis present

## 2023-04-24 DIAGNOSIS — M7062 Trochanteric bursitis, left hip: Secondary | ICD-10-CM | POA: Diagnosis present

## 2023-04-24 DIAGNOSIS — M7061 Trochanteric bursitis, right hip: Secondary | ICD-10-CM

## 2023-05-09 ENCOUNTER — Ambulatory Visit (INDEPENDENT_AMBULATORY_CARE_PROVIDER_SITE_OTHER): Payer: Medicaid Other | Admitting: Orthopedic Surgery

## 2023-05-09 DIAGNOSIS — M1611 Unilateral primary osteoarthritis, right hip: Secondary | ICD-10-CM | POA: Diagnosis not present

## 2023-05-09 MED ORDER — OXYCODONE HCL 5 MG PO CAPS
5.0000 mg | ORAL_CAPSULE | Freq: Two times a day (BID) | ORAL | 0 refills | Status: DC | PRN
Start: 2023-05-09 — End: 2023-06-27

## 2023-05-09 MED ORDER — OXYCODONE HCL 5 MG PO TABS
5.0000 mg | ORAL_TABLET | Freq: Two times a day (BID) | ORAL | 0 refills | Status: DC | PRN
Start: 2023-05-09 — End: 2023-06-27

## 2023-05-10 ENCOUNTER — Encounter: Payer: Self-pay | Admitting: Orthopedic Surgery

## 2023-05-10 NOTE — Progress Notes (Signed)
Office Visit Note   Patient: Danielle [name]           Date of Birth: 1960-10-14           MRN: [id number] Visit Date: 05/09/2023 Requested by: Jenell Milliner, MD [phone] [address] [place],  [place] [address] PCP: Jenell Milliner, MD  Subjective: Chief Complaint  Patient presents with   Right Hip - Follow-up    HPI: Danielle [name] is a 61 y.o. female who presents to the office reporting continued and debilitating right hip pain.  Since she was last seen she has had an MRI scan.  MRI scan does confirm effusion in the right hip along with severe arthritis.  Definitely worse than it appears on plain radiographs.  Progression has occurred since last imaging studies in 2022.  She is debilitated with the pain.  Patient is a smoker..                ROS: All systems reviewed are negative as they relate to the chief complaint within the history of present illness.  Patient denies fevers or chills.  Assessment & Plan: Visit Diagnoses:  1. Arthritis of right hip     Plan: Impression is end-stage right hip arthritis in a patient who needs hip replacement.  Does have slightly increased body mass index but when laying supine most of the weight appears to be in her central core and not around the anterior thigh.  One-time prescription for oxycodone written although I did tell her that this would make her postop pain relief worse.  She states she is only going to take it at night to help her sleep.  I would like for her to see Delane Ginger and or Dr. Magnus Ivan to see if we can get her in for a hip replacement sooner rather than later.  Follow-up with me as needed.  Follow-Up Instructions: No follow-ups on file.   Orders:  Orders Placed This Encounter  Procedures   Ambulatory referral to Orthopedic Surgery   Meds ordered this encounter  Medications   oxyCODONE (OXY IR/ROXICODONE) 5 MG immediate release tablet    Sig: Take 1 tablet (5 mg total) by mouth every 12 (twelve) hours as needed for severe pain  (pain score 7-10).    Dispense:  25 tablet    Refill:  0   oxycodone (OXY-IR) 5 MG capsule    Sig: Take 1 capsule (5 mg total) by mouth every 12 (twelve) hours as needed.    Dispense:  30 capsule    Refill:  0      Procedures: No procedures performed   Clinical Data: No additional findings.  Objective: Vital Signs: There were no vitals taken for this visit.  Physical Exam:  Constitutional: Patient appears well-developed HEENT:  Head: Normocephalic Eyes:EOM are normal Neck: Normal range of motion Cardiovascular: Normal rate Pulmonary/chest: Effort normal Neurologic: Patient is alert Skin: Skin is warm Psychiatric: Patient has normal mood and affect  Ortho Exam: Ortho exam demonstrates fairly antalgic and Trendelenburg type gait to the right.  Ankle dorsiflexion plantarflexion intact.  Does have some significant groin pain with internal/external rotation of the leg.  Hip flexion strength is weaker on the right than the left.  Specialty Comments:  No specialty comments available.  Imaging: No results found.   PMFS History: Patient Active Problem List   Diagnosis Date Noted   Abnormal CT scan, cervical spine (07/18/2021) 12/19/2022   Failure to attend appointment 10/01/2022   Lumbar facet joint  pain 09/17/2022   Osteoarthritis of facet joint of lumbar spine 09/17/2022   Chronic low back pain (Bilateral) (R>L) w/o sciatica 08/29/2022   Somatic dysfunction of sacroiliac joint (Right) 04/25/2022   Sacroiliac joint dysfunction (Right) 04/25/2022   Right buttock pain 01/31/2022   Left thigh pain 01/21/2022   Violation of controlled substance agreement 01/21/2022   Abnormal drug screen (11/21/2021) 01/17/2022   Cocaine use 01/17/2022   Chronic hip pain (Left) 09/05/2021   Chronic groin pain (Left) 09/05/2021   Cervicalgia 06/19/2021   Chronic neck pain with history of cervical spinal surgery 06/19/2021   COPD exacerbation (HCC) 04/10/2021   Chronic shoulder pain  (Left) 02/07/2021   Chronic use of opiate for therapeutic purpose 11/28/2020   Acetabular labrum tear, sequela (Left) 10/09/2020   Acetabular labrum tear, sequela (Right) 10/09/2020   Greater trochanteric bursitis of hips (Bilateral) 10/09/2020   Epidural fibrosis (Left: L5-S1) 10/09/2020   Chronic groin pain (Bilateral) 10/09/2020   Abnormal MRI, lumbar spine (09/28/2020) 10/09/2020   Abnormal MRI, hip (Bilateral) (10/04/2020) 10/09/2020   DDD (degenerative disc disease), cervical 09/11/2020   DDD (degenerative disc disease), lumbosacral 09/11/2020   Other intervertebral disc degeneration, lumbar region 09/11/2020   Chronic sacroiliac joint pain (Left) 07/06/2020   Other spondylosis, sacral and sacrococcygeal region 07/06/2020   Sacroiliac joint dysfunction (Left) 07/06/2020   Somatic dysfunction of sacroiliac joint (Left) 07/06/2020   Enthesopathy of sacroiliac joint (Left) 07/06/2020   History of multiple allergies 07/06/2020   Chronic hip pain (Right) 06/01/2020   Osteoarthritis of hip (Right) 06/01/2020   History of drug-induced anaphylaxis (Contrast Dye) 06/01/2020   Uncomplicated opioid dependence (HCC) 01/10/2020   Asthma 12/31/2019   Leg edema, left 12/31/2019   Post-nasal drip 12/01/2019   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   History of total replacement of shoulder joints (Bilateral) 10/12/2019   Chronic shoulder pain after replacement 05/13/2019   Osteoarthritis involving multiple joints 01/20/2019   Pharmacologic therapy 06/03/2018   Disorder of skeletal system 06/03/2018   Problems influencing health status 06/03/2018   Chronic trochanteric bursitis (Bilateral) 06/03/2018   Elevated sed rate 06/03/2018   Elevated vitamin B12 level 06/03/2018   Trochanteric bursitis of hip (Right) 06/03/2018   History of allergy to iodine & radiographic dye 04/30/2018   URI (upper respiratory infection) 04/08/2018   Cellulitis of leg, right 03/25/2018    Abnormal mammogram of left breast 10/06/2017   Spondylosis without myelopathy or radiculopathy, lumbosacral region 09/24/2017   Osteoarthritis of shoulder (Bilateral) 08/19/2017   Morbid (severe) obesity due to excess calories (HCC) 07/24/2017   Chronic hip pain (2ry area of Pain) (Bilateral) (R>L) 06/11/2017   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) 06/11/2017   Trigger point with back pain 06/11/2017   Arthralgia of hip 06/11/2017   Myofascial pain 05/27/2017   Nausea 01/13/2017   Vasovagal episode 01/13/2017   Encounter for screening colonoscopy 09/12/2016   Rectal bleeding 09/12/2016   Cervical facet syndrome (HCC) 09/05/2016   Chronic musculoskeletal pain 09/05/2016   Muscle spasm of back 09/05/2016   Chronic pain syndrome 07/25/2016   Complaints of weakness of lower extremity 07/25/2016   Morbid obesity with BMI of 40.0-44.9, adult (HCC) 07/25/2016   Anterolisthesis of lumbar spine (L4/L5) 07/25/2016   Chronic pain disorder 07/25/2016   Seasonal allergic rhinitis 03/01/2016   History of total arthroplasty of shoulder (Left) 03/01/2016   Chronic sacroiliac pain (Bilateral) (L>R) 01/11/2016   Chronic sacroiliac joint pain (Right) 01/11/2016   Long term prescription opiate  use 01/02/2016   Chronic shoulder pain (1ry area of Pain) (Bilateral) (R>L) 01/02/2016   Chronic low back pain (3ry area of Pain) (Bilateral) (R>L) w/ sciatica (Right) 01/02/2016   Lumbar facet syndrome (Bilateral) (R>L) 01/02/2016   Lumbar spondylosis 01/02/2016   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   Chronic lower extremity pain (Right) 01/02/2016   Chronic neck pain (Bilateral) (R>L) 01/02/2016   Chronic cervical radicular pain (Right) 01/02/2016   Ulnar neuropathy (Left) 01/02/2016   Hx of cervical spine surgery 01/02/2016   Cervical spondylosis 01/02/2016   Encounter for therapeutic drug level monitoring 01/02/2016   Encounter for pain management planning 01/02/2016   Fibromyalgia 01/02/2016    Mucositis oral 09/12/2015   COPD (chronic obstructive pulmonary disease) (HCC) 01/02/2015   Current tobacco use 01/02/2015   Mixed simple and mucopurulent chronic bronchitis (HCC) 01/02/2015   Cervical spinal cord compression (HCC) (April, 2017) 10/21/2014   Spondylosis without myelopathy or radiculopathy, cervical region 10/21/2014   Cervical spinal stenosis 05/10/2014   Adiposity 03/22/2014   B12 deficiency 02/10/2014   Hypertension 12/11/2013   Depression 12/10/2013   Hypothyroid 12/10/2013   Past Medical History:  Diagnosis Date   Acute postoperative pain 01/13/2017   Anxiety    Arthritis    Asthma    Bell's palsy    Bursitis    COPD (chronic obstructive pulmonary disease) (HCC)    Depression    Fibromyalgia    Heart murmur    Hepatitis C    Hiatal hernia    Hyperlipidemia    Hypertension    IBS (irritable bowel syndrome)    Insomnia    Thyroid disease     Family History  Problem Relation Age of Onset   Gout Mother    Cancer Mother    Heart disease Father     Past Surgical History:  Procedure Laterality Date   BACK SURGERY  2013   lumbar disk   CARPAL TUNNEL RELEASE Right    COLONOSCOPY WITH PROPOFOL N/A 02/05/2017   Procedure: COLONOSCOPY WITH PROPOFOL;  Surgeon: Earline Mayotte, MD;  Location: [facility] ENDOSCOPY;  Service: Endoscopy;  Laterality: N/A;   ELBOW SURGERY Left    FOOT SURGERY Right    necksurgery  10/21/2014   spine neck fusion   NOSE SURGERY     PARTIAL HYSTERECTOMY     TOTAL SHOULDER REPLACEMENT Bilateral    Social History   Occupational History   Not on file  Tobacco Use   Smoking status: Every Day    Current packs/day: 2.00    Average packs/day: 2.0 packs/day for 40.0 years (80.0 ttl pk-yrs)    Types: Cigarettes   Smokeless tobacco: Never   Tobacco comments:    1/2 ppd 12/08/19//lmr  Substance and Sexual Activity   Alcohol use: No    Alcohol/week: 0.0 standard drinks of alcohol    Comment: rarely   Drug use: No   Sexual activity:  Not on file

## 2023-05-14 ENCOUNTER — Encounter: Payer: Self-pay | Admitting: Orthopaedic Surgery

## 2023-05-14 ENCOUNTER — Ambulatory Visit (INDEPENDENT_AMBULATORY_CARE_PROVIDER_SITE_OTHER): Payer: Medicaid Other | Admitting: Orthopaedic Surgery

## 2023-05-14 VITALS — Ht 65.0 in | Wt 250.0 lb

## 2023-05-14 DIAGNOSIS — M1611 Unilateral primary osteoarthritis, right hip: Secondary | ICD-10-CM

## 2023-05-14 DIAGNOSIS — M25551 Pain in right hip: Secondary | ICD-10-CM | POA: Diagnosis not present

## 2023-05-14 NOTE — Progress Notes (Signed)
The patient is a 62 year old female sent to me by my partner Dr. 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  With time she has developed worsening arthritis and she does ambit with a cane.  Her BMI is almost 42.  She is not a diabetic but is a significant daily smoker.  She has been in pain management for years.  This was mainly due to her back.  She has had numerous steroid injections in her right hip and this has been getting worse over the last 2 to 3 years.  A recent MRI even compared to previous MRI shows subchondral edema in the acetabulum and femoral head with significant areas of cartilage loss.  There is also degenerative labral tearing of that hip.  I did go over these imaging studies with her.  On exam her hip pain is severe with internal and external rotation.  It is hard to get a good exam on her because of her pain.  When we have her lay in a supine position there is envelope of getting to her hip easily.  Most of her obesity is in her core.  We had a long and thorough discussion about hip replacement surgery.  I showed her hip replacement model and gave her handout about hip replacement surgery.  We discussed the risk and benefits of the surgery and she knows these are all significantly heightened given her morbid obesity combined with her smoking.  Fortunately she is not a diabetic.  I did review all of her medications and past medical history within epic.  She understands we will have her on narcotic pain medicine after surgery but not before surgery.  Will work on getting this scheduled in the near future.  All questions and concerns were addressed and answered.

## 2023-05-19 ENCOUNTER — Ambulatory Visit: Payer: Medicaid Other | Admitting: Physician Assistant

## 2023-05-27 ENCOUNTER — Telehealth: Payer: Self-pay

## 2023-05-27 NOTE — Telephone Encounter (Signed)
Planning right THA on 07/01/23.  Patient just started Reclast infusions for osteoporosis.  How would you know if bones were too soft to do THA?

## 2023-05-28 NOTE — Telephone Encounter (Signed)
LMOM for patient of the below message  

## 2023-06-18 ENCOUNTER — Other Ambulatory Visit: Payer: Self-pay

## 2023-06-25 NOTE — Pre-Procedure Instructions (Signed)
Surgical Instructions   Your procedure is scheduled on Tuesday, December 17th. Report to [facility] Main Entrance "A" at 05:30 A.M., then check in with the Admitting office. Any questions or running late day of surgery: call [phone]  Questions prior to your surgery date: call [phone], Monday-Friday, 8am-4pm. If you experience any cold or flu symptoms such as cough, fever, chills, shortness of breath, etc. between now and your scheduled surgery, please notify us at the above number.     Remember:  Do not eat after midnight the night before your surgery  You may drink clear liquids until 04:30 AM the morning of your surgery.   Clear liquids allowed are: Water, Non-Citrus Juices (without pulp), Carbonated Beverages, Clear Tea (no milk, honey, etc.), Black Coffee Only (NO MILK, CREAM OR POWDERED CREAMER of any kind), and Gatorade.  Patient Instructions  The night before surgery:  No food after midnight. ONLY clear liquids after midnight  The day of surgery (if you do NOT have diabetes):  Drink ONE (1) Pre-Surgery Clear Ensure by 04:30 AM the morning of surgery. Drink in one sitting. Do not sip.  This drink was given to you during your hospital  pre-op appointment visit.  Nothing else to drink after completing the  Pre-Surgery Clear Ensure.          If you have questions, please contact your surgeon's office.    Take these medicines the morning of surgery with A SIP OF WATER  levothyroxine (SYNTHROID, LEVOTHROID)    May take these medicines IF NEEDED: albuterol (PROVENTIL HFA;VENTOLIN HFA)- bring inhaler with you on day of surgery Azelastine nasal spray cyclobenzaprine (FLEXERIL)  SPIRIVA HANDIHALER- bring inhaler with you on day of surgery SYMBICORT    One week prior to surgery, STOP taking any Aspirin (unless otherwise instructed by your surgeon) Aleve, Naproxen, Ibuprofen, Motrin, Advil, Goody's, BC's, all herbal medications, fish oil, and non-prescription  vitamins. This includes diclofenac Sodium (VOLTAREN) gel.                      Do NOT Smoke (Tobacco/Vaping) for 24 hours prior to your procedure.  If you use a CPAP at night, you may bring your mask/headgear for your overnight stay.   You will be asked to remove any contacts, glasses, piercing's, hearing aid's, dentures/partials prior to surgery. Please bring cases for these items if needed.    Patients discharged the day of surgery will not be allowed to drive home, and someone needs to stay with them for 24 hours.  SURGICAL WAITING ROOM VISITATION Patients may have no more than 2 support people in the waiting area - these visitors may rotate.   Pre-op nurse will coordinate an appropriate time for 1 ADULT support person, who may not rotate, to accompany patient in pre-op.  Children under the age of 77 must have an adult with them who is not the patient and must remain in the main waiting area with an adult.  If the patient needs to stay at the hospital during part of their recovery, the visitor guidelines for inpatient rooms apply.  Please refer to the [facility] website for the visitor guidelines for any additional information.   If you received a COVID test during your pre-op visit  it is requested that you wear a mask when out in public, stay away from anyone that may not be feeling well and notify your surgeon if you develop symptoms. If you have been in contact with anyone that  has tested positive in the last 10 days please notify you surgeon.      Pre-operative 5 CHG Bathing Instructions   You can play a key role in reducing the risk of infection after surgery. Your skin needs to be as free of germs as possible. You can reduce the number of germs on your skin by washing with CHG (chlorhexidine gluconate) soap before surgery. CHG is an antiseptic soap that kills germs and continues to kill germs even after washing.   DO NOT use if you have an allergy to chlorhexidine/CHG or  antibacterial soaps. If your skin becomes reddened or irritated, stop using the CHG and notify one of our RNs at [phone].   Please shower with the CHG soap starting 4 days before surgery using the following schedule:     Please keep in mind the following:  DO NOT shave, including legs and underarms, starting the day of your first shower.   You may shave your face at any point before/day of surgery.  Place clean sheets on your bed the day you start using CHG soap. Use a clean washcloth (not used since being washed) for each shower. DO NOT sleep with pets once you start using the CHG.   CHG Shower Instructions:  Wash your face and private area with normal soap. If you choose to wash your hair, wash first with your normal shampoo.  After you use shampoo/soap, rinse your hair and body thoroughly to remove shampoo/soap residue.  Turn the water OFF and apply about 3 tablespoons (45 ml) of CHG soap to a CLEAN washcloth.  Apply CHG soap ONLY FROM YOUR NECK DOWN TO YOUR TOES (washing for 3-5 minutes)  DO NOT use CHG soap on face, private areas, open wounds, or sores.  Pay special attention to the area where your surgery is being performed.  If you are having back surgery, having someone wash your back for you may be helpful. Wait 2 minutes after CHG soap is applied, then you may rinse off the CHG soap.  Pat dry with a clean towel  Put on clean clothes/pajamas   If you choose to wear lotion, please use ONLY the CHG-compatible lotions on the back of this paper.   Additional instructions for the day of surgery: DO NOT APPLY any lotions, deodorants, cologne, or perfumes.   Do not bring valuables to the hospital. [facility] is not responsible for any belongings/valuables. Do not wear nail polish, gel polish, artificial nails, or any other type of covering on natural nails (fingers and toes) Do not wear jewelry or makeup Put on clean/comfortable clothes.  Please brush your teeth.  Ask your  nurse before applying any prescription medications to the skin.     CHG Compatible Lotions   Aveeno Moisturizing lotion  Cetaphil Moisturizing Cream  Cetaphil Moisturizing Lotion  Clairol Herbal Essence Moisturizing Lotion, Dry Skin  Clairol Herbal Essence Moisturizing Lotion, Extra Dry Skin  Clairol Herbal Essence Moisturizing Lotion, Normal Skin  Curel Age Defying Therapeutic Moisturizing Lotion with Alpha Hydroxy  Curel Extreme Care Body Lotion  Curel Soothing Hands Moisturizing Hand Lotion  Curel Therapeutic Moisturizing Cream, Fragrance-Free  Curel Therapeutic Moisturizing Lotion, Fragrance-Free  Curel Therapeutic Moisturizing Lotion, Original Formula  Eucerin Daily Replenishing Lotion  Eucerin Dry Skin Therapy Plus Alpha Hydroxy Crme  Eucerin Dry Skin Therapy Plus Alpha Hydroxy Lotion  Eucerin Original Crme  Eucerin Original Lotion  Eucerin Plus Crme Eucerin Plus Lotion  Eucerin TriLipid Replenishing Lotion  Keri Anti-Bacterial Hand Lotion  Keri Deep Conditioning Original Lotion Dry Skin Formula Softly Scented  Keri Deep Conditioning Original Lotion, Fragrance Free Sensitive Skin Formula  Keri Lotion Fast Absorbing Fragrance Free Sensitive Skin Formula  Keri Lotion Fast Absorbing Softly Scented Dry Skin Formula  Keri Original Lotion  Keri Skin Renewal Lotion Keri Silky Smooth Lotion  Keri Silky Smooth Sensitive Skin Lotion  Nivea Body Creamy Conditioning Oil  Nivea Body Extra Enriched Lotion  Nivea Body Original Lotion  Nivea Body Sheer Moisturizing Lotion Nivea Crme  Nivea Skin Firming Lotion  NutraDerm 30 Skin Lotion  NutraDerm Skin Lotion  NutraDerm Therapeutic Skin Cream  NutraDerm Therapeutic Skin Lotion  ProShield Protective Hand Cream  Provon moisturizing lotion  Please read over the following fact sheets that you were given.

## 2023-06-26 ENCOUNTER — Other Ambulatory Visit: Payer: Self-pay

## 2023-06-26 ENCOUNTER — Encounter (HOSPITAL_COMMUNITY): Payer: Self-pay

## 2023-06-26 ENCOUNTER — Encounter (HOSPITAL_COMMUNITY)
Admission: RE | Admit: 2023-06-26 | Discharge: 2023-06-26 | Disposition: A | Discharge status: Home / Self Care | Payer: Medicaid Other | Source: Ambulatory Visit | Attending: Orthopaedic Surgery | Admitting: Orthopaedic Surgery

## 2023-06-26 VITALS — BP 139/83 | HR 85 | Temp 98.5°F | Resp 20 | Ht 65.0 in | Wt 251.2 lb

## 2023-06-26 DIAGNOSIS — M1611 Unilateral primary osteoarthritis, right hip: Secondary | ICD-10-CM | POA: Diagnosis not present

## 2023-06-26 DIAGNOSIS — Z01818 Encounter for other preprocedural examination: Secondary | ICD-10-CM | POA: Insufficient documentation

## 2023-06-26 DIAGNOSIS — K759 Inflammatory liver disease, unspecified: Secondary | ICD-10-CM | POA: Diagnosis not present

## 2023-06-26 HISTORY — DX: Hypothyroidism, unspecified: E03.9

## 2023-06-26 HISTORY — DX: Postnasal drip: R09.82

## 2023-06-26 HISTORY — DX: Gastro-esophageal reflux disease without esophagitis: K21.9

## 2023-06-26 LAB — COMPREHENSIVE METABOLIC PANEL
ALT: 18 U/L (ref 0–44)
AST: 24 U/L (ref 15–41)
Albumin: 3.6 g/dL (ref 3.5–5.0)
Alkaline Phosphatase: 53 U/L (ref 38–126)
Anion gap: 8 (ref 5–15)
BUN: 7 mg/dL — ABNORMAL LOW (ref 8–23)
CO2: 27 mmol/L (ref 22–32)
Calcium: 9.3 mg/dL (ref 8.9–10.3)
Chloride: 102 mmol/L (ref 98–111)
Creatinine, Ser: 1 mg/dL (ref 0.44–1.00)
GFR, Estimated: >60 mL/min (ref >60)
Glucose, Bld: 95 mg/dL (ref 70–99)
Potassium: 3.5 mmol/L (ref 3.5–5.1)
Sodium: 137 mmol/L (ref 135–145)
Total Bilirubin: 1 mg/dL (ref <1.2)
Total Protein: 6.9 g/dL (ref 6.5–8.1)

## 2023-06-26 LAB — CBC
HCT: 40.7 % (ref 36.0–46.0)
Hemoglobin: 13.1 g/dL (ref 12.0–15.0)
MCH: 31.4 pg (ref 26.0–34.0)
MCHC: 32.2 g/dL (ref 30.0–36.0)
MCV: 97.6 fL (ref 80.0–100.0)
Platelets: 304 10*3/uL (ref 150–400)
RBC: 4.17 MIL/uL (ref 3.87–5.11)
RDW: 13.3 % (ref 11.5–15.5)
WBC: 9.5 10*3/uL (ref 4.0–10.5)
nRBC: 0 % (ref 0.0–0.2)

## 2023-06-26 LAB — SURGICAL PCR SCREEN
MRSA, PCR: NEGATIVE
Staphylococcus aureus: NEGATIVE

## 2023-06-26 LAB — TYPE AND SCREEN
ABO/RH(D): A POS
Antibody Screen: NEGATIVE

## 2023-06-26 NOTE — Progress Notes (Signed)
PCP - Dr. Tessie Eke Cardiologist - denies  PPM/ICD - denies Device Orders - n/a Rep Notified - n/a  Chest x-ray - denies EKG - 06/26/23 Stress Test - denies ECHO - denies Cardiac Cath - denies  Sleep Study - denies CPAP - n/a  Fasting Blood Sugar - n/a   Last dose of GLP1 agonist-  n/a GLP1 instructions: n/a  Blood Thinner Instructions: n/a Aspirin Instructions: n/a  ERAS Protcol - clears until 0430 PRE-SURGERY Ensure - to complete by 0430  COVID TEST- n/a   Anesthesia review: yes  Patient denies shortness of breath, fever, cough and chest pain at PAT appointment   All instructions explained to the patient, with a verbal understanding of the material. Patient agrees to go over the instructions while at home for a better understanding. Patient also instructed to self quarantine after being tested for COVID-19. The opportunity to ask questions was provided.

## 2023-06-27 ENCOUNTER — Encounter (HOSPITAL_COMMUNITY): Payer: Self-pay

## 2023-06-27 NOTE — Progress Notes (Signed)
Case: [id number] Date/Time: 07/01/23 0715   Procedure: RIGHT TOTAL HIP ARTHROPLASTY ANTERIOR APPROACH (Right: Hip)   Anesthesia type: Choice   Pre-op diagnosis: osteoarthritis right hip   [facility]: [facility] ROOM 02 / [facility] OR   Surgeons: Kathryne Hitch, MD       DISCUSSION: Danielle [name] is a 62 yo female with PMH of every day smoking, HTN, heart murmur, asthma/COPD, GERD, hiatal hernia, hx of Hep C s/p treatment in 2008, hypothyroidism, fibromyalgia, chronic pain, anxiety, depression, obesity (BMI 41) s/p cervical fusion. Patient with multiple drug allergies (29 listed).  Patient last saw PCP on 04/15/23. Patient follows with pain management for chronic pain and referred to different pain clinic per her request. She was also treated for an ear infection at that time. All other issues stable.  Patient has hx of heart murmur documented. Patient has not had an echo. Unclear if heart has been auscultated recently. Will have to eval DOS.   VS: BP 139/83   Pulse 85   Temp 36.9 C   Resp 20   Ht 5\' 5"  (1.651 m)   Wt 113.9 kg   SpO2 95%   BMI 41.80 kg/m   PROVIDERS: Jenell Milliner, MD   LABS: Labs reviewed: Acceptable for surgery. (all labs ordered are listed, but only abnormal results are displayed)  Labs Reviewed  COMPREHENSIVE METABOLIC PANEL - Abnormal; Notable for the following components:      Result Value   BUN 7 (*)    All other components within normal limits  SURGICAL PCR SCREEN  CBC  TYPE AND SCREEN     IMAGES:  MRI Lumbar spin w/o contrast  :IMPRESSION:  --Postsurgical changes following L4-L5 and L5-S1 laminectomies. Unchanged appearance of tissue effacing the left subarticular zone at the L5-S1 level, which could represent scarring in the postoperative setting, versus small residual/recurrent disc herniation. Further characterization could be obtained with contrast-enhanced MRI if clinical warranted.  --Additional multilevel degenerative changes at other  levels are similar to prior, with mild to moderate spinal canal and neural foraminal narrowing as outlined.  EKG 06/26/23:  Normal sinus rhythm Left axis deviation Low voltage QRS Incomplete right bundle branch block  CV:  Past Medical History:  Diagnosis Date   Acid reflux    Acute postoperative pain 01/13/2017   Anxiety    Arthritis    Asthma    Bell's palsy    Bursitis    COPD (chronic obstructive pulmonary disease) (HCC)    Depression    Fibromyalgia    Heart murmur    Hepatitis C    Hiatal hernia    Hyperlipidemia    Hypertension    Hypothyroidism    IBS (irritable bowel syndrome)    Insomnia    Post-nasal drip    Thyroid disease     Past Surgical History:  Procedure Laterality Date   BACK SURGERY  2013   lumbar disk   CARPAL TUNNEL RELEASE Right    COLONOSCOPY WITH PROPOFOL N/A 02/05/2017   Procedure: COLONOSCOPY WITH PROPOFOL;  Surgeon: Earline Mayotte, MD;  Location: [facility] ENDOSCOPY;  Service: Endoscopy;  Laterality: N/A;   ELBOW SURGERY Left    FOOT SURGERY Right    necksurgery  10/21/2014   spine neck fusion   NOSE SURGERY     PARTIAL HYSTERECTOMY     TOTAL SHOULDER REPLACEMENT Bilateral     MEDICATIONS:  albuterol (PROVENTIL HFA;VENTOLIN HFA) 108 (90 Base) MCG/ACT inhaler   amoxicillin-clavulanate (AUGMENTIN) 875-125 MG tablet  Azelastine HCl 0.15 % SOLN   cetirizine (ZYRTEC) 10 MG tablet   citalopram (CELEXA) 40 MG tablet   cyclobenzaprine (FLEXERIL) 10 MG tablet   dextromethorphan-guaiFENesin (MUCINEX DM) 30-600 MG 12hr tablet   diclofenac Sodium (VOLTAREN) 1 % GEL   docusate sodium (COLACE) 100 MG capsule   levothyroxine (SYNTHROID, LEVOTHROID) 50 MCG tablet   Menthol, Topical Analgesic, (BENGAY EX)   oxyCODONE (OXY IR/ROXICODONE) 5 MG immediate release tablet   oxycodone (OXY-IR) 5 MG capsule   SPIRIVA HANDIHALER 18 MCG inhalation capsule   SYMBICORT 160-4.5 MCG/ACT inhaler   VITAMIN D PO   No current facility-administered  medications for this encounter.   Marcille Blanco [facility] Surgical Short Stay/Anesthesiology [facility] Phone [phone] 06/27/2023 11:01 AM

## 2023-06-27 NOTE — Anesthesia Preprocedure Evaluation (Signed)
Anesthesia Evaluation    Airway        Dental   Pulmonary Current Smoker          Cardiovascular hypertension,      Neuro/Psych    GI/Hepatic   Endo/Other    Renal/GU      Musculoskeletal   Abdominal   Peds  Hematology   Anesthesia Other Findings   Reproductive/Obstetrics                              Anesthesia Physical Anesthesia Plan  ASA:   Anesthesia Plan:    Post-op Pain Management:    Induction:   PONV Risk Score and Plan:   Airway Management Planned:   Additional Equipment:   Intra-op Plan:   Post-operative Plan:   Informed Consent:   Plan Discussed with:   Anesthesia Plan Comments: (See PAT note from 12/12 by Sherlie Ban PA-C )         Anesthesia Quick Evaluation

## 2023-06-30 ENCOUNTER — Other Ambulatory Visit: Payer: Self-pay

## 2023-06-30 DIAGNOSIS — Z96641 Presence of right artificial hip joint: Secondary | ICD-10-CM

## 2023-07-01 ENCOUNTER — Ambulatory Visit (HOSPITAL_COMMUNITY): Admission: RE | Admit: 2023-07-01 | Payer: Medicaid Other | Source: Ambulatory Visit | Admitting: Orthopaedic Surgery

## 2023-07-01 ENCOUNTER — Encounter (HOSPITAL_COMMUNITY): Payer: Self-pay | Admitting: Medical

## 2023-07-01 DIAGNOSIS — M1611 Unilateral primary osteoarthritis, right hip: Secondary | ICD-10-CM

## 2023-07-01 SURGERY — ARTHROPLASTY, HIP, TOTAL, ANTERIOR APPROACH
Anesthesia: Choice | Site: Hip | Laterality: Right

## 2023-07-02 ENCOUNTER — Ambulatory Visit
Admission: EM | Admit: 2023-07-02 | Discharge: 2023-07-02 | Disposition: A | Discharge status: Home / Self Care | Payer: Medicaid Other | Attending: Family Medicine | Admitting: Family Medicine

## 2023-07-02 ENCOUNTER — Ambulatory Visit (INDEPENDENT_AMBULATORY_CARE_PROVIDER_SITE_OTHER): Payer: Medicaid Other

## 2023-07-02 DIAGNOSIS — J441 Chronic obstructive pulmonary disease with (acute) exacerbation: Secondary | ICD-10-CM

## 2023-07-02 DIAGNOSIS — F17210 Nicotine dependence, cigarettes, uncomplicated: Secondary | ICD-10-CM

## 2023-07-02 DIAGNOSIS — J40 Bronchitis, not specified as acute or chronic: Secondary | ICD-10-CM

## 2023-07-02 MED ORDER — FLUCONAZOLE 150 MG PO TABS: 150.0000 mg | ORAL_TABLET | ORAL | 0 refills | Status: DC

## 2023-07-02 MED ORDER — DEXAMETHASONE SODIUM PHOSPHATE 10 MG/ML IJ SOLN
10.0000 mg | Freq: Once | INTRAMUSCULAR | Status: AC
  Administered 2023-07-02: 10 mg via INTRAMUSCULAR

## 2023-07-02 MED ORDER — HYDROCOD POLI-CHLORPHE POLI ER 10-8 MG/5ML PO SUER
5.0000 mL | Freq: Two times a day (BID) | ORAL | 0 refills | Status: DC | PRN
Start: 2023-07-02 — End: 2023-10-28

## 2023-07-02 MED ORDER — IPRATROPIUM-ALBUTEROL 0.5-2.5 (3) MG/3ML IN SOLN: 3.0000 mL | Freq: Four times a day (QID) | RESPIRATORY_TRACT | 0 refills | Status: AC | PRN

## 2023-07-02 MED ORDER — IPRATROPIUM-ALBUTEROL 0.5-2.5 (3) MG/3ML IN SOLN
3.0000 mL | Freq: Once | RESPIRATORY_TRACT | Status: AC
  Administered 2023-07-02: 3 mL via RESPIRATORY_TRACT

## 2023-07-02 MED ORDER — PREDNISONE 50 MG PO TABS
50.0000 mg | ORAL_TABLET | Freq: Every day | ORAL | 0 refills | Status: AC
Start: 2023-07-02 — End: 2023-07-07

## 2023-07-02 MED ORDER — DOXYCYCLINE HYCLATE 100 MG PO CAPS: 100.0000 mg | ORAL_CAPSULE | Freq: Two times a day (BID) | ORAL | 0 refills | Status: DC

## 2023-07-02 NOTE — ED Triage Notes (Signed)
Pt c/o cough,congestion,wheezing & sob x2 wks. Hx of COPD.

## 2023-07-02 NOTE — Discharge Instructions (Addendum)
Stop by the pharmacy to pick up your prescriptions.  Follow up with your primary care provider as needed.  Give yourself a breathing treatment every 6 hours for the next 48 hours and again right before bed.

## 2023-07-02 NOTE — ED Provider Notes (Signed)
[facility]    CSN: [id number] Arrival date & time: 07/02/23  0856      History   Chief Complaint Chief Complaint  Patient presents with   Cough   Nasal Congestion   Shortness of Breath   Wheezing    HPI Danielle [name] is a 62 y.o. female.   HPI  History obtained from the patient. Danielle [name] presents for cough, congestion, wheezing with shortness of breath for the past 2 weeks. Endorses rhinorrhea and decreased appetite.  Unable to sleep due to coughing. Tried Theraflu, Tessalon perles Zrytec, and Mucinex without relief. No known fevers. She has been around sick people. Her grand-daughter coughed in her fast. Has COPD and continues to smoking. Typically, smokes a ppd but now she coughs so much she can't really smoke.    Had to reschedule her hip surgery yesterday.      Past Medical History:  Diagnosis Date   Acid reflux    Anxiety    Arthritis    Asthma    Bell's palsy    Bursitis    COPD (chronic obstructive pulmonary disease) (HCC)    Depression    Fibromyalgia    Heart murmur    Hepatitis C    Hiatal hernia    Hyperlipidemia    Hypertension    Hypothyroidism    IBS (irritable bowel syndrome)    Insomnia    Post-nasal drip     Patient Active Problem List   Diagnosis Date Noted   Abnormal CT scan, cervical spine (07/18/2021) 12/19/2022   Failure to attend appointment 10/01/2022   Lumbar facet joint pain 09/17/2022   Osteoarthritis of facet joint of lumbar spine 09/17/2022   Chronic low back pain (Bilateral) (R>L) w/o sciatica 08/29/2022   Somatic dysfunction of sacroiliac joint (Right) 04/25/2022   Sacroiliac joint dysfunction (Right) 04/25/2022   Right buttock pain 01/31/2022   Left thigh pain 01/21/2022   Violation of controlled substance agreement 01/21/2022   Abnormal drug screen (11/21/2021) 01/17/2022   Cocaine use 01/17/2022   Chronic hip pain (Left) 09/05/2021   Chronic groin pain (Left) 09/05/2021   Cervicalgia 06/19/2021    Chronic neck pain with history of cervical spinal surgery 06/19/2021   COPD exacerbation (HCC) 04/10/2021   Chronic shoulder pain (Left) 02/07/2021   Chronic use of opiate for therapeutic purpose 11/28/2020   Acetabular labrum tear, sequela (Left) 10/09/2020   Acetabular labrum tear, sequela (Right) 10/09/2020   Greater trochanteric bursitis of hips (Bilateral) 10/09/2020   Epidural fibrosis (Left: L5-S1) 10/09/2020   Chronic groin pain (Bilateral) 10/09/2020   Abnormal MRI, lumbar spine (09/28/2020) 10/09/2020   Abnormal MRI, hip (Bilateral) (10/04/2020) 10/09/2020   DDD (degenerative disc disease), cervical 09/11/2020   DDD (degenerative disc disease), lumbosacral 09/11/2020   Other intervertebral disc degeneration, lumbar region 09/11/2020   Chronic sacroiliac joint pain (Left) 07/06/2020   Other spondylosis, sacral and sacrococcygeal region 07/06/2020   Sacroiliac joint dysfunction (Left) 07/06/2020   Somatic dysfunction of sacroiliac joint (Left) 07/06/2020   Enthesopathy of sacroiliac joint (Left) 07/06/2020   History of multiple allergies 07/06/2020   Chronic hip pain (Right) 06/01/2020   Unilateral primary osteoarthritis, right hip 06/01/2020   History of drug-induced anaphylaxis (Contrast Dye) 06/01/2020   Uncomplicated opioid dependence (HCC) 01/10/2020   Asthma 12/31/2019   Leg edema, left 12/31/2019   Post-nasal drip 12/01/2019   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   History of total replacement of shoulder joints (Bilateral) 10/12/2019   Chronic  shoulder pain after replacement 05/13/2019   Osteoarthritis involving multiple joints 01/20/2019   Pharmacologic therapy 06/03/2018   Disorder of skeletal system 06/03/2018   Problems influencing health status 06/03/2018   Chronic trochanteric bursitis (Bilateral) 06/03/2018   Elevated sed rate 06/03/2018   Elevated vitamin B12 level 06/03/2018   Trochanteric bursitis of hip (Right) 06/03/2018   History  of allergy to iodine & radiographic dye 04/30/2018   URI (upper respiratory infection) 04/08/2018   Cellulitis of leg, right 03/25/2018   Abnormal mammogram of left breast 10/06/2017   Spondylosis without myelopathy or radiculopathy, lumbosacral region 09/24/2017   Osteoarthritis of shoulder (Bilateral) 08/19/2017   Morbid (severe) obesity due to excess calories (HCC) 07/24/2017   Chronic hip pain (2ry area of Pain) (Bilateral) (R>L) 06/11/2017   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) 06/11/2017   Trigger point with back pain 06/11/2017   Arthralgia of hip 06/11/2017   Myofascial pain 05/27/2017   Nausea 01/13/2017   Vasovagal episode 01/13/2017   Encounter for screening colonoscopy 09/12/2016   Rectal bleeding 09/12/2016   Cervical facet syndrome (HCC) 09/05/2016   Chronic musculoskeletal pain 09/05/2016   Muscle spasm of back 09/05/2016   Chronic pain syndrome 07/25/2016   Complaints of weakness of lower extremity 07/25/2016   Morbid obesity with BMI of 40.0-44.9, adult (HCC) 07/25/2016   Anterolisthesis of lumbar spine (L4/L5) 07/25/2016   Chronic pain disorder 07/25/2016   Seasonal allergic rhinitis 03/01/2016   History of total arthroplasty of shoulder (Left) 03/01/2016   Chronic sacroiliac pain (Bilateral) (L>R) 01/11/2016   Chronic sacroiliac joint pain (Right) 01/11/2016   Long term prescription opiate use 01/02/2016   Chronic shoulder pain (1ry area of Pain) (Bilateral) (R>L) 01/02/2016   Chronic low back pain (3ry area of Pain) (Bilateral) (R>L) w/ sciatica (Right) 01/02/2016   Lumbar facet syndrome (Bilateral) (R>L) 01/02/2016   Lumbar spondylosis 01/02/2016   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   Chronic lower extremity pain (Right) 01/02/2016   Chronic neck pain (Bilateral) (R>L) 01/02/2016   Chronic cervical radicular pain (Right) 01/02/2016   Ulnar neuropathy (Left) 01/02/2016   Hx of cervical spine surgery 01/02/2016   Cervical spondylosis 01/02/2016    Encounter for therapeutic drug level monitoring 01/02/2016   Encounter for pain management planning 01/02/2016   Fibromyalgia 01/02/2016   Mucositis oral 09/12/2015   COPD (chronic obstructive pulmonary disease) (HCC) 01/02/2015   Current tobacco use 01/02/2015   Mixed simple and mucopurulent chronic bronchitis (HCC) 01/02/2015   Cervical spinal cord compression (HCC) (April, 2017) 10/21/2014   Spondylosis without myelopathy or radiculopathy, cervical region 10/21/2014   Cervical spinal stenosis 05/10/2014   Adiposity 03/22/2014   B12 deficiency 02/10/2014   Hypertension 12/11/2013   Depression 12/10/2013   Hypothyroid 12/10/2013    Past Surgical History:  Procedure Laterality Date   BACK SURGERY  2013   lumbar disk   CARPAL TUNNEL RELEASE Right    COLONOSCOPY WITH PROPOFOL N/A 02/05/2017   Procedure: COLONOSCOPY WITH PROPOFOL;  Surgeon: Earline Mayotte, MD;  Location: [facility] END[facility];  Service: Endoscopy;  Laterality: N/A;   ELBOW SURGERY Left    FOOT SURGERY Right    necksurgery  10/21/2014   spine neck fusion   NOSE SURGERY     PARTIAL HYSTERECTOMY     TOTAL SHOULDER REPLACEMENT Bilateral     OB History   No obstetric history on file.      Home Medications    Prior to Admission medications   Medication Sig Start Date  End Date Taking? Authorizing Provider  albuterol (PROVENTIL HFA;VENTOLIN HFA) 108 (90 Base) MCG/ACT inhaler Inhale 2 puffs into the lungs every 4 (four) hours as needed for wheezing or shortness of breath.  11/03/15  Yes [provider]  Azelastine HCl 0.15 % SOLN Place 1 spray into both nostrils daily as needed (allergies). 09/25/21  Yes [provider]  cetirizine (ZYRTEC) 10 MG tablet Take 10 mg by mouth at bedtime.   Yes [provider]  chlorpheniramine-HYDROcodone (TUSSIONEX) 10-8 MG/5ML Take 5 mLs by mouth every 12 (twelve) hours as needed. 07/02/23  Yes Ferris Tally, DO  citalopram (CELEXA) 40 MG tablet Take 40 mg by  mouth at bedtime.   Yes [provider]  cyclobenzaprine (FLEXERIL) 10 MG tablet Take 10 mg by mouth 2 (two) times daily as needed for muscle spasms. 03/19/22  Yes [provider]  dextromethorphan-guaiFENesin (MUCINEX DM) 30-600 MG 12hr tablet Take 1 tablet by mouth 2 (two) times daily.   Yes [provider]  docusate sodium (COLACE) 100 MG capsule Take 100 mg by mouth daily as needed for moderate constipation.   Yes [provider]  doxycycline (VIBRAMYCIN) 100 MG capsule Take 1 capsule (100 mg total) by mouth 2 (two) times daily. 07/02/23  Yes Leroy Trim, DO  fluconazole (DIFLUCAN) 150 MG tablet Take 1 tablet (150 mg total) by mouth every 3 (three) days for 2 doses. 07/02/23 07/06/23 Yes Celest Reitz, DO  ipratropium-albuterol (DUONEB) 0.5-2.5 (3) MG/3ML SOLN Take 3 mLs by nebulization every 6 (six) hours as needed. 07/02/23  Yes Green Quincy, Seward Meth, DO  levothyroxine (SYNTHROID, LEVOTHROID) 50 MCG tablet Take 50 mcg by mouth daily before breakfast. 12/15/15  Yes [provider]  Menthol, Topical Analgesic, (BENGAY EX) Apply 1 Application topically daily as needed (pain).   Yes [provider]  predniSONE (DELTASONE) 50 MG tablet Take 1 tablet (50 mg total) by mouth daily for 5 days. 07/02/23 07/07/23 Yes Fritzie Prioleau, DO  SPIRIVA HANDIHALER 18 MCG inhalation capsule Place 18 mcg into inhaler and inhale daily.   Yes [provider]  SYMBICORT 160-4.5 MCG/ACT inhaler Inhale 2 puffs into the lungs 2 (two) times daily as needed (shortness of breath). 08/26/22 08/26/23 Yes [provider]  VITAMIN D PO Take 5,000 Units by mouth at bedtime.   Yes [provider]  diclofenac Sodium (VOLTAREN) 1 % GEL Apply 4 g topically 4 (four) times daily. Patient taking differently: Apply 4 g topically daily as needed (pain). 05/15/20 06/24/23  Delano Metz, MD    Family History Family History  Problem Relation Age of Onset    Gout Mother    Cancer Mother    Heart disease Father     Social History Social History   Tobacco Use   Smoking status: Every Day    Current packs/day: 2.00    Average packs/day: 2.0 packs/day for 40.0 years (80.0 ttl pk-yrs)    Types: Cigarettes   Smokeless tobacco: Never   Tobacco comments:    1/2 ppd 12/08/19//lmr  Substance Use Topics   Alcohol use: No    Alcohol/week: 0.0 standard drinks of alcohol    Comment: rarely   Drug use: No     Allergies   Aripiprazole, Duloxetine, Gabapentin, Gold, Iodinated contrast media, Naproxen sodium, Nortriptyline hcl, Nsaids, Pregabalin, Meperidine, Ace inhibitors, Aspirin, Cefpodoxime, Fluoxetine, Fluticasone-salmeterol, Levofloxacin, Lithium, Paroxetine, Tape, Telithromycin, Theophylline, Topiramate, Trazodone, Triamcinolone, Venlafaxine, Bupropion, Ketorolac tromethamine, Morphine, and Moxifloxacin   Review of Systems Review of Systems: negative unless otherwise  stated in HPI.      Physical Exam Triage Vital Signs ED Triage Vitals  Encounter Vitals Group     BP 07/02/23 0944 (!) 148/92     Systolic BP Percentile --      Diastolic BP Percentile --      Pulse Rate 07/02/23 0944 98     Resp 07/02/23 0944 16     Temp 07/02/23 0944 99.3 F (37.4 C)     Temp Source 07/02/23 0944 Oral     SpO2 07/02/23 0944 90 %     Weight 07/02/23 0943 251 lb 3.2 oz (113.9 kg)     Height 07/02/23 0943 5\' 5"  (1.651 m)     Head Circumference --      Peak Flow --      Pain Score 07/02/23 0949 0     Pain Loc --      Pain Education --      Exclude from Growth Chart --    No data found.  Updated Vital Signs BP (!) 148/92 (BP Location: Left Arm)   Pulse 98   Temp 99.3 F (37.4 C) (Oral)   Resp 16   Ht 5\' 5"  (1.651 m)   Wt 113.9 kg   SpO2 90%   BMI 41.80 kg/m   Visual Acuity Right Eye Distance:   Left Eye Distance:   Bilateral Distance:    Right Eye Near:   Left Eye Near:    Bilateral Near:     Physical Exam GEN:     alert, ill  but non-toxic appearing female HENT:  mucus membranes moist, no nasal discharge EYES:   no scleral injection or discharge RESP:  no increased work of breathing, decreased air movement bilaterally, frequent cough, expiratory wheezing, coarse breathe sounds  CVS:   regular rate and rhythm Skin:   warm and dry    UC Treatments / Results  Labs (all labs ordered are listed, but only abnormal results are displayed) Labs Reviewed - No data to display  EKG   Radiology DG Chest 2 View Result Date: 07/02/2023 CLINICAL DATA:  Cough, wheezing, short of breath for 2 weeks EXAM: CHEST - 2 VIEW COMPARISON:  04/28/2019 FINDINGS: Frontal and lateral views of the chest demonstrate an unremarkable cardiac silhouette. No airspace disease, effusion, or pneumothorax. No acute bony abnormalities. Bilateral shoulder arthroplasties unchanged. IMPRESSION: 1. No acute intrathoracic process. Electronically Signed   By: Sharlet Salina M.D.   On: 07/02/2023 11:44    Procedures Procedures (including critical care time)  Medications Ordered in UC Medications  ipratropium-albuterol (DUONEB) 0.5-2.5 (3) MG/3ML nebulizer solution 3 mL (3 mLs Nebulization Given 07/02/23 1030)  dexamethasone (DECADRON) injection 10 mg (10 mg Intramuscular Given 07/02/23 1030)    Initial Impression / Assessment and Plan / UC Course  I have reviewed the triage vital signs and the nursing notes.  Pertinent labs & imaging results that were available during my care of the patient were reviewed by me and considered in my medical decision making (see chart for details).       Pt is a 62 y.o. female with history of COPD who presents for 2 weeks of cough, wheezing and shortness of breath  that is not improving.  Danielle [name] is  afebrile here without recent antipyretics. Satting 90% on room air. Overall pt is  non-toxic appearing, well hydrated, without respiratory distress. Pulmonary exam is remarkable for decreased air movement, expiratory  wheezing and coarse breathe sounds with frequent cough.  After shared  decision making, we will pursue chest x-ray.  COVID  and influenza testing deferred due to length of symptoms. Given DuoNeb treatment and Decadron IM 10 mg.    Chest xray personally reviewed by me without focal pneumonia, pleural effusion, cardiomegaly or pneumothorax. Patient aware the radiologist has not read her xray and is comfortable with the preliminary read by me. Will review radiologist read when available and call patient if a change in plan is warranted.  Pt agreeable to this plan prior to discharge.   Treat acute COPD exacerbation with bronchitis with steroids and antibiotics as below. Difucan co-prescribed due to history of antibiotic associated yeast infections.  Tussionex cough syrup given for cough and allow patient to rest. DuoNeb prescribed and pt advised to used every 6 hours for the next 48 hours and before bed. Typical duration of symptoms discussed. Return and ED precautions given and patient voiced understanding.   Discussed MDM, treatment plan and plan for follow-up with patient who agrees with plan.    Radiologist impression reviewed.   Final Clinical Impressions(s) / UC Diagnoses   Final diagnoses:  COPD exacerbation (HCC)  Bronchitis  Cigarette smoker     Discharge Instructions      Stop by the pharmacy to pick up your prescriptions.  Follow up with your primary care provider as needed.  Give yourself a breathing treatment every 6 hours for the next 48 hours and again right before bed.       ED Prescriptions     Medication Sig Dispense Auth. Provider   predniSONE (DELTASONE) 50 MG tablet Take 1 tablet (50 mg total) by mouth daily for 5 days. 5 tablet Arlicia Paquette, DO   doxycycline (VIBRAMYCIN) 100 MG capsule Take 1 capsule (100 mg total) by mouth 2 (two) times daily. 14 capsule Vincient Vanaman, DO   chlorpheniramine-HYDROcodone (TUSSIONEX) 10-8 MG/5ML Take 5 mLs by mouth every 12  (twelve) hours as needed. 115 mL Shambhavi Salley, DO   fluconazole (DIFLUCAN) 150 MG tablet Take 1 tablet (150 mg total) by mouth every 3 (three) days for 2 doses. 2 tablet Annaka Cleaver, DO   ipratropium-albuterol (DUONEB) 0.5-2.5 (3) MG/3ML SOLN Take 3 mLs by nebulization every 6 (six) hours as needed. 360 mL Kea Callan, Seward Meth, DO      I have reviewed the PDMP during this encounter.   Katha Cabal, DO 07/02/23 1156

## 2023-07-14 ENCOUNTER — Encounter: Payer: Medicaid Other | Admitting: Physician Assistant

## 2023-08-12 ENCOUNTER — Other Ambulatory Visit: Payer: Self-pay

## 2023-08-18 NOTE — Progress Notes (Signed)
 Surgical Instructions   Your procedure is scheduled on August 26, 2023. Report to [facility] Main Entrance A at 8:00 A.M., then check in with the Admitting office. Any questions or running late day of surgery: call [phone]  Questions prior to your surgery date: call [phone], Monday-Friday, 8am-4pm. If you experience any cold or flu symptoms such as cough, fever, chills, shortness of breath, etc. between now and your scheduled surgery, please notify us  at the above number.     Remember:  Do not eat after midnight the night before your surgery  You may drink clear liquids until 7:00AM the morning of your surgery.   Clear liquids allowed are: Water, Non-Citrus Juices (without pulp), Carbonated Beverages, Clear Tea (no milk, honey, etc.), Black Coffee Only (NO MILK, CREAM OR POWDERED CREAMER of any kind), and Gatorade.  Patient Instructions  The night before surgery:  No food after midnight. ONLY clear liquids after midnight  The day of surgery (if you do NOT have diabetes):  Drink ONE (1) Pre-Surgery Clear Ensure by 7:00AM the morning of surgery. Drink in one sitting. Do not sip.  This drink was given to you during your hospital  pre-op appointment visit.  Nothing else to drink after completing the  Pre-Surgery Clear Ensure.          If you have questions, please contact your surgeon's office.     Take these medicines the morning of surgery with A SIP OF WATER: dextromethorphan-guaiFENesin  (MUCINEX  DM) levothyroxine  (SYNTHROID , LEVOTHROID)  SPIRIVA  HANDIHALER   May take these medicines IF NEEDED: albuterol  (PROVENTIL  HFA;VENTOLIN  HFA) 108 (90 Base) MCG/ACT inhaler - please bring inhaler to the hospital cyclobenzaprine  (FLEXERIL )  ipratropium-albuterol  (DUONEB) 0.5-2.5 (3) MG/3ML SOLN  SYMBICORT   One week prior to surgery, STOP taking any Aspirin  (unless otherwise instructed by your surgeon) Aleve, Naproxen, Ibuprofen, Motrin, Advil, Goody's, BC's, all herbal  medications, fish oil, and non-prescription vitamins, and diclofenac  Sodium (VOLTAREN ) 1 % GEL                      Do NOT Smoke (Tobacco/Vaping) for 24 hours prior to your procedure.  If you use a CPAP at night, you may bring your mask/headgear for your overnight stay.   You will be asked to remove any contacts, glasses, piercing's, hearing aid's, dentures/partials prior to surgery. Please bring cases for these items if needed.    Patients discharged the day of surgery will not be allowed to drive home, and someone needs to stay with them for 24 hours.  SURGICAL WAITING ROOM VISITATION Patients may have no more than 2 support people in the waiting area - these visitors may rotate.   Pre-op nurse will coordinate an appropriate time for 1 ADULT support person, who may not rotate, to accompany patient in pre-op.  Children under the age of 4 must have an adult with them who is not the patient and must remain in the main waiting area with an adult.  If the patient needs to stay at the hospital during part of their recovery, the visitor guidelines for inpatient rooms apply.  Please refer to the [facility] website for the visitor guidelines for any additional information.   If you received a COVID test during your pre-op visit  it is requested that you wear a mask when out in public, stay away from anyone that may not be feeling well and notify your surgeon if you develop symptoms. If you have been in contact with anyone  that has tested positive in the last 10 days please notify you surgeon.      Pre-operative 5 CHG Bathing Instructions   You can play a key role in reducing the risk of infection after surgery. Your skin needs to be as free of germs as possible. You can reduce the number of germs on your skin by washing with CHG (chlorhexidine  gluconate) soap before surgery. CHG is an antiseptic soap that kills germs and continues to kill germs even after washing.   DO NOT use if you have an  allergy to chlorhexidine /CHG or antibacterial soaps. If your skin becomes reddened or irritated, stop using the CHG and notify one of our RNs at [phone].   Please shower with the CHG soap starting 4 days before surgery using the following schedule:     Please keep in mind the following:  DO NOT shave, including legs and underarms, starting the day of your first shower.   You may shave your face at any point before/day of surgery.  Place clean sheets on your bed the day you start using CHG soap. Use a clean washcloth (not used since being washed) for each shower. DO NOT sleep with pets once you start using the CHG.   CHG Shower Instructions:  Wash your face and private area with normal soap. If you choose to wash your hair, wash first with your normal shampoo.  After you use shampoo/soap, rinse your hair and body thoroughly to remove shampoo/soap residue.  Turn the water OFF and apply about 3 tablespoons (45 ml) of CHG soap to a CLEAN washcloth.  Apply CHG soap ONLY FROM YOUR NECK DOWN TO YOUR TOES (washing for 3-5 minutes)  DO NOT use CHG soap on face, private areas, open wounds, or sores.  Pay special attention to the area where your surgery is being performed.  If you are having back surgery, having someone wash your back for you may be helpful. Wait 2 minutes after CHG soap is applied, then you may rinse off the CHG soap.  Pat dry with a clean towel  Put on clean clothes/pajamas   If you choose to wear lotion, please use ONLY the CHG-compatible lotions that are listed below.  Additional instructions for the day of surgery: DO NOT APPLY any lotions, deodorants, cologne, or perfumes.   Do not bring valuables to the hospital. [facility] is not responsible for any belongings/valuables. Do not wear nail polish, gel polish, artificial nails, or any other type of covering on natural nails (fingers and toes) Do not wear jewelry or makeup Put on clean/comfortable clothes.  Please  brush your teeth.  Ask your nurse before applying any prescription medications to the skin.     CHG Compatible Lotions   Aveeno Moisturizing lotion  Cetaphil Moisturizing Cream  Cetaphil Moisturizing Lotion  Clairol Herbal Essence Moisturizing Lotion, Dry Skin  Clairol Herbal Essence Moisturizing Lotion, Extra Dry Skin  Clairol Herbal Essence Moisturizing Lotion, Normal Skin  Curel Age Defying Therapeutic Moisturizing Lotion with Alpha Hydroxy  Curel Extreme Care Body Lotion  Curel Soothing Hands Moisturizing Hand Lotion  Curel Therapeutic Moisturizing Cream, Fragrance-Free  Curel Therapeutic Moisturizing Lotion, Fragrance-Free  Curel Therapeutic Moisturizing Lotion, Original Formula  Eucerin Daily Replenishing Lotion  Eucerin Dry Skin Therapy Plus Alpha Hydroxy Crme  Eucerin Dry Skin Therapy Plus Alpha Hydroxy Lotion  Eucerin Original Crme  Eucerin Original Lotion  Eucerin Plus Crme Eucerin Plus Lotion  Eucerin TriLipid Replenishing Lotion  Keri Anti-Bacterial Hand Lotion  Keri  Deep Conditioning Original Lotion Dry Skin Formula Softly Scented  Keri Deep Conditioning Original Lotion, Fragrance Free Sensitive Skin Formula  Keri Lotion Fast Absorbing Fragrance Free Sensitive Skin Formula  Keri Lotion Fast Absorbing Softly Scented Dry Skin Formula  Keri Original Lotion  Keri Skin Renewal Lotion Keri Silky Smooth Lotion  Keri Silky Smooth Sensitive Skin Lotion  Nivea Body Creamy Conditioning Oil  Nivea Body Extra Enriched Lotion  Nivea Body Original Lotion  Nivea Body Sheer Moisturizing Lotion Nivea Crme  Nivea Skin Firming Lotion  NutraDerm 30 Skin Lotion  NutraDerm Skin Lotion  NutraDerm Therapeutic Skin Cream  NutraDerm Therapeutic Skin Lotion  ProShield Protective Hand Cream  Provon moisturizing lotion  Please read over the following fact sheets that you were given.

## 2023-08-19 ENCOUNTER — Encounter (HOSPITAL_COMMUNITY): Payer: Self-pay

## 2023-08-19 ENCOUNTER — Other Ambulatory Visit: Payer: Self-pay

## 2023-08-19 ENCOUNTER — Encounter (HOSPITAL_COMMUNITY)
Admission: RE | Admit: 2023-08-19 | Discharge: 2023-08-19 | Disposition: A | Discharge status: Home / Self Care | Payer: Medicaid Other | Source: Ambulatory Visit | Attending: Orthopaedic Surgery | Admitting: Orthopaedic Surgery

## 2023-08-19 VITALS — BP 140/92 | HR 99 | Temp 98.4°F | Resp 18 | Ht 65.0 in | Wt 241.3 lb

## 2023-08-19 DIAGNOSIS — Z01812 Encounter for preprocedural laboratory examination: Secondary | ICD-10-CM | POA: Insufficient documentation

## 2023-08-19 DIAGNOSIS — M1611 Unilateral primary osteoarthritis, right hip: Secondary | ICD-10-CM | POA: Insufficient documentation

## 2023-08-19 DIAGNOSIS — Z01818 Encounter for other preprocedural examination: Secondary | ICD-10-CM

## 2023-08-19 LAB — CBC
HCT: 41.3 % (ref 36.0–46.0)
Hemoglobin: 13.3 g/dL (ref 12.0–15.0)
MCH: 31 pg (ref 26.0–34.0)
MCHC: 32.2 g/dL (ref 30.0–36.0)
MCV: 96.3 fL (ref 80.0–100.0)
Platelets: 284 10*3/uL (ref 150–400)
RBC: 4.29 MIL/uL (ref 3.87–5.11)
RDW: 13.3 % (ref 11.5–15.5)
WBC: 7.8 10*3/uL (ref 4.0–10.5)
nRBC: 0 % (ref 0.0–0.2)

## 2023-08-19 LAB — BASIC METABOLIC PANEL
Anion gap: 7 (ref 5–15)
BUN: 7 mg/dL — ABNORMAL LOW (ref 8–23)
CO2: 28 mmol/L (ref 22–32)
Calcium: 9.1 mg/dL (ref 8.9–10.3)
Chloride: 104 mmol/L (ref 98–111)
Creatinine, Ser: 0.88 mg/dL (ref 0.44–1.00)
GFR, Estimated: >60 mL/min (ref >60)
Glucose, Bld: 99 mg/dL (ref 70–99)
Potassium: 4.2 mmol/L (ref 3.5–5.1)
Sodium: 139 mmol/L (ref 135–145)

## 2023-08-19 LAB — TYPE AND SCREEN
ABO/RH(D): A POS
Antibody Screen: NEGATIVE

## 2023-08-19 LAB — SURGICAL PCR SCREEN
MRSA, PCR: NEGATIVE
Staphylococcus aureus: NEGATIVE
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
## 2023-08-19 NOTE — Progress Notes (Addendum)
 Anesthesia Chart Review:  Case: [id number] Date/Time: 08/26/23 0945   Procedure: TOTAL HIP ARTHROPLASTY ANTERIOR APPROACH (Right: Hip)   Anesthesia type: Choice   Pre-op diagnosis: osteoarthritis right hip   [facility]: [facility] ROOM 04 / [facility]   Surgeons: Danielle Lonni GRADE, MD       DISCUSSION: Patient is a 63 year old female scheduled for the above procedure. Surgery was initially scheduled for 07/01/23, but it was postponed due to COPD exacerbation/bronchitis (with UC visit 07/02/23, CXR without PNA).  She now feels recovered.   History includes smoking (~ 1PDD), COPD, asthma, HTN, heart murmur (childhood), GERD, hiatal hernia, IBS, Hepatitis C (s/p treatment 2008), hypothyroidism, fibromyalgia, chronic pain, anxiety, depression, spinal surgery, osteoarthritis (left TSA 11/08/13, complicated by post-operative AKI; right TSA 12/27/15), obesity (BMI 40), spinal surgery (L5-S1 laminectomies ~ 2013; C3-6 ACDF, C5 corpectomy 10/21/14). Multiple drug allergies or intolerances (29 listed).  I evaluated patient at her PAT visit due to history of murmur. She reports that history was from childhood and resolved. She did report a remote cardiology evaluation with Florencio Kava, MD (prior to him jointing [facility]) and had an unremarkable work-up. It sounds like she may have been referred more for CAD risk factors. Her dad died at age 30 from cardiac issues and had a history of a CABG. She also has a brother with what sounds HFrEF, but says he was born with a congenital hear defect that required surgery at a young age. She denied chest pain, SOB. She reported somewhat limited for about 3 years but was able to do some house cleaning and take her dogs on short walks, but over the past 6 months her activity is very limited due to her hip pain. Her family is having to help with chores. She smokes about 1 PPD. She does feel recovered from her COPD exacerbation in December. She does not require home O2.  COPD/asthma is managed with albuterol  MDI and Duoneb as needed, Zyrtec 10 mg daily, Mucinex  DM (for post-nasal drip and smoker's morning congestion), Spiriva  inhaler daily, Symbicort BID as needed. She uses albuterol  MDI daily most days, but nebulizer only as needed.   Heart RRR. I did not appreciate a murmur. She had faint scattered expiratory wheezing upper lobes and right base which cleared with deep breathing. No conversational dyspnea. No carotid bruits noted. 06/26/23 EKG showed NSR, LAD, incomplete RBBB (old). CBC and BMET essentially normal.. LFTs normal 06/26/23.   Anesthesia team to evaluate on the day of surgery.     VS: BP (!) 140/92   Pulse 99   Temp 36.9 C (Oral)   Resp 18   Ht 5' 5 (1.651 m)   Wt 109.5 kg   SpO2 100%   BMI 40.15 kg/m   PROVIDERS: Derenda Rockers, MD is PCP  Reported remote cardiology evaluation by Florencio Kava, MD with normal testing.   LABS: Labs reviewed: Acceptable for surgery. (all labs ordered are listed, but only abnormal results are displayed)  Labs Reviewed  CBC  BASIC METABOLIC PANEL  TYPE AND SCREEN    IMAGES: CXR 07/02/23: FINDINGS: Frontal and lateral views of the chest demonstrate an unremarkable cardiac silhouette. No airspace disease, effusion, or pneumothorax. No acute bony abnormalities. Bilateral shoulder arthroplasties unchanged. IMPRESSION: 1. No acute intrathoracic process.  MRI Right Hip 04/24/23: IMPRESSION: 1. Moderate-severe osteoarthritis of the right hip, progressed since 2022. 2. Small right hip joint effusion with synovitis. 3. Moderate osteoarthritis of the left hip. Previously seen calcifications in the  left trochanteric bursa have resolved.   MRI L-spine 03/24/23 [facility]): IMPRESSION: --Postsurgical changes following L4-L5 and L5-S1 laminectomies. Unchanged appearance of tissue effacing the left subarticular zone at the L5-S1 level, which could represent scarring in the postoperative setting,  versus small residual/recurrent disc herniation. Further characterization could be obtained with contrast-enhanced MRI if clinical warranted.   --Additional multilevel degenerative changes at other levels are similar to prior, with mild to moderate spinal canal and neural foraminal narrowing as outlined. [See full report]   EKG: 06/26/23: Normal sinus rhythm Left axis deviation Low voltage QRS Incomplete right bundle branch block Abnormal ECG Confirmed by Barbaraann Kotyk [phone]) on 06/26/2023 6:25:34 PM   CV: Remote testing. See DISCUSSION.   Past Medical History:  Diagnosis Date   Acid reflux    Anxiety    Arthritis    Asthma    Bell's palsy    Bursitis    COPD (chronic obstructive pulmonary disease) (HCC)    Depression    Fibromyalgia    Heart murmur    Hepatitis C    Hiatal hernia    Hyperlipidemia    Hypertension    Hypothyroidism    IBS (irritable bowel syndrome)    Insomnia    Post-nasal drip     Past Surgical History:  Procedure Laterality Date   BACK SURGERY  2013   lumbar disk   CARPAL TUNNEL RELEASE Right    COLONOSCOPY WITH PROPOFOL  N/A 02/05/2017   Procedure: COLONOSCOPY WITH PROPOFOL ;  Surgeon: Dessa Reyes ORN, MD;  Location: [facility] ENDOSCOPY;  Service: Endoscopy;  Laterality: N/A;   ELBOW SURGERY Left    FOOT SURGERY Right    necksurgery  10/21/2014   spine neck fusion   NOSE SURGERY     PARTIAL HYSTERECTOMY     TOTAL SHOULDER REPLACEMENT Bilateral     MEDICATIONS:  albuterol  (PROVENTIL  HFA;VENTOLIN  HFA) 108 (90 Base) MCG/ACT inhaler   Azelastine HCl 0.15 % SOLN   cetirizine (ZYRTEC) 10 MG tablet   chlorpheniramine-HYDROcodone  (TUSSIONEX) 10-8 MG/5ML   citalopram  (CELEXA ) 40 MG tablet   cyclobenzaprine  (FLEXERIL ) 10 MG tablet   dextromethorphan-guaiFENesin  (MUCINEX  DM) 30-600 MG 12hr tablet   diclofenac  Sodium (VOLTAREN ) 1 % GEL   docusate sodium  (COLACE) 100 MG capsule   doxycycline  (VIBRAMYCIN ) 100 MG capsule   ipratropium-albuterol   (DUONEB) 0.5-2.5 (3) MG/3ML SOLN   levothyroxine  (SYNTHROID , LEVOTHROID) 50 MCG tablet   Menthol , Topical Analgesic, (BENGAY EX)   SPIRIVA  HANDIHALER 18 MCG inhalation capsule   SYMBICORT 160-4.5 MCG/ACT inhaler   VITAMIN D  PO   No current facility-administered medications for this encounter.   She is no longer taking doxycycline .   Isaiah Ruder, PA-C Surgical Short Stay/Anesthesiology [facility] Phone [phone] [facility] Phone [phone] 08/19/2023 6:43 PM

## 2023-08-19 NOTE — Anesthesia Preprocedure Evaluation (Signed)
Anesthesia Evaluation    Airway        Dental   Pulmonary Current Smoker          Cardiovascular hypertension,      Neuro/Psych    GI/Hepatic   Endo/Other    Renal/GU      Musculoskeletal   Abdominal   Peds  Hematology   Anesthesia Other Findings   Reproductive/Obstetrics                             Anesthesia Physical Anesthesia Plan  ASA:   Anesthesia Plan:    Post-op Pain Management:    Induction:   PONV Risk Score and Plan:   Airway Management Planned:   Additional Equipment:   Intra-op Plan:   Post-operative Plan:   Informed Consent:   Plan Discussed with:   Anesthesia Plan Comments: (PAT note written 08/19/2023 by Shonna Chock, PA-C.  )       Anesthesia Quick Evaluation

## 2023-08-19 NOTE — Progress Notes (Signed)
 PCP - Danielle [name] COME Cardiologist - Dr. Florencio- has not seen in 4-5 years  PPM/ICD - denies Device Orders -  Rep Notified -   Chest x-ray - 07/02/23 EKG - 06/26/23 Stress Test -  ECHO -  Cardiac Cath -   Sleep Study - denies CPAP -   Fasting Blood Sugar - na Checks Blood Sugar _____ times a day  Last dose of GLP1 agonist-  na GLP1 instructions:   Blood Thinner Instructions:na Aspirin  Instructions:na  ERAS Protcol -clears until 0700 PRE-SURGERY Ensure or G2- Ensure  COVID TEST- na   Anesthesia review: yes- pt has history of murmur- no echocardiogram. She has not seen her cardiologist,Dr. Florencio, in 4-5 year. Examined by Danielle Ruder, PA in PAT.   Patient denies shortness of breath, fever, cough and chest pain at PAT appointment   All instructions explained to the patient, with a verbal understanding of the material. Patient agrees to go over the instructions while at home for a better understanding. Patient also instructed to wear a mask when out in public prior to surgery.  The opportunity to ask questions was provided.

## 2023-08-25 ENCOUNTER — Telehealth: Payer: Self-pay

## 2023-08-25 NOTE — Progress Notes (Signed)
 Patient called and stated that she has a stye on her eye and she is scheduled to see an eye doctor this morning.  She is concerned that her surgery will need to be rescheduled.  She stated that she has left a message at the office and is waiting on a return call.  I have also left a message with Leonora Ramus.

## 2023-08-25 NOTE — Telephone Encounter (Signed)
 From Veverly Grace, RN at [employer]:  Good morning!! The attached patient just left me a voicemail stating that she had a stye to appear on her eye. She stated that she is going to the eye doctor this morning but was asking if she will need to reschedule surgery tomorrow due to this.

## 2023-08-25 NOTE — Telephone Encounter (Signed)
 Cancelled surgery for tomorrow.  She has follow up with eye doctor on 09/01/23.  Will call me back after that to discuss rescheduling.

## 2023-08-25 NOTE — Progress Notes (Signed)
 Patient called to state that her procedure is canceled due cellulitis in her eye.  Procedure was cancelled by Dr. Lucienne Ryder.    Patient states that she will be on antibiotic for 10 days.

## 2023-08-26 ENCOUNTER — Ambulatory Visit (HOSPITAL_COMMUNITY): Admission: RE | Admit: 2023-08-26 | Payer: Medicaid Other | Source: Ambulatory Visit | Admitting: Orthopaedic Surgery

## 2023-08-26 ENCOUNTER — Encounter (HOSPITAL_COMMUNITY): Payer: Self-pay | Admitting: Vascular Surgery

## 2023-08-26 DIAGNOSIS — M1611 Unilateral primary osteoarthritis, right hip: Secondary | ICD-10-CM

## 2023-08-26 SURGERY — ARTHROPLASTY, HIP, TOTAL, ANTERIOR APPROACH
Anesthesia: Choice | Site: Hip | Laterality: Right

## 2023-09-01 ENCOUNTER — Ambulatory Visit: Payer: Medicaid Other | Attending: Orthopaedic Surgery | Admitting: Physical Therapy

## 2023-09-03 ENCOUNTER — Ambulatory Visit: Payer: Medicaid Other | Admitting: Physical Therapy

## 2023-09-08 ENCOUNTER — Encounter: Payer: Medicaid Other | Admitting: Physical Therapy

## 2023-09-08 ENCOUNTER — Encounter: Payer: Medicaid Other | Admitting: Physician Assistant

## 2023-09-10 ENCOUNTER — Encounter: Payer: Medicaid Other | Admitting: Physical Therapy

## 2023-09-15 ENCOUNTER — Encounter: Payer: Medicaid Other | Admitting: Physical Therapy

## 2023-09-17 ENCOUNTER — Encounter: Payer: Medicaid Other | Admitting: Physical Therapy

## 2023-09-22 ENCOUNTER — Encounter: Payer: Medicaid Other | Admitting: Physical Therapy

## 2023-09-24 ENCOUNTER — Encounter: Payer: Medicaid Other | Admitting: Physical Therapy

## 2023-09-29 ENCOUNTER — Encounter: Payer: Medicaid Other | Admitting: Physical Therapy

## 2023-10-01 ENCOUNTER — Encounter: Payer: Medicaid Other | Admitting: Physical Therapy

## 2023-10-06 ENCOUNTER — Encounter: Payer: Medicaid Other | Admitting: Physical Therapy

## 2023-10-08 ENCOUNTER — Encounter: Payer: Medicaid Other | Admitting: Physical Therapy

## 2023-10-14 ENCOUNTER — Telehealth: Payer: Self-pay

## 2023-10-14 NOTE — Telephone Encounter (Signed)
 I returned patient's call to discuss rescheduling her surgery.  Left voice mail message for return call.

## 2023-10-30 NOTE — Pre-Procedure Instructions (Signed)
 Surgical Instructions   Your procedure is scheduled on Tuesday, April 29th. Report to [facility] Main Entrance A at 06:30 A.M., then check in with the Admitting office. Any questions or running late day of surgery: call [phone]  Questions prior to your surgery date: call [phone], Monday-Friday, 8am-4pm. If you experience any cold or flu symptoms such as cough, fever, chills, shortness of breath, etc. between now and your scheduled surgery, please notify us  at the above number.     Remember:  Do not eat after midnight the night before your surgery  You may drink clear liquids until 05:30 AM the morning of your surgery.   Clear liquids allowed are: Water, Non-Citrus Juices (without pulp), Carbonated Beverages, Clear Tea (no milk, honey, etc.), Black Coffee Only (NO MILK, CREAM OR POWDERED CREAMER of any kind), and Gatorade.  Patient Instructions  The night before surgery:  No food after midnight. ONLY clear liquids after midnight  The day of surgery (if you do NOT have diabetes):  Drink ONE (1) Pre-Surgery Clear Ensure by 05:30 AM the morning of surgery. Drink in one sitting. Do not sip.  This drink was given to you during your hospital  pre-op appointment visit.  Nothing else to drink after completing the  Pre-Surgery Clear Ensure.         If you have questions, please contact your surgeon's office.    Take these medicines the morning of surgery with A SIP OF WATER  fluticasone  (FLONASE)  levothyroxine  (SYNTHROID , LEVOTHROID)  SPIRIVA  HANDIHALER  SYMBICORT    May take these medicines IF NEEDED: albuterol  (PROVENTIL  HFA;VENTOLIN  HFA)- bring inhaler with you on day of surgery ipratropium-albuterol  (DUONEB)    One week prior to surgery, STOP taking any Aspirin  (unless otherwise instructed by your surgeon) Aleve, Naproxen, Ibuprofen, Motrin, Advil, Goody's, BC's, all herbal medications, fish oil, and non-prescription vitamins.                     Do NOT Smoke  (Tobacco/Vaping) for 24 hours prior to your procedure.  If you use a CPAP at night, you may bring your mask/headgear for your overnight stay.   You will be asked to remove any contacts, glasses, piercing's, hearing aid's, dentures/partials prior to surgery. Please bring cases for these items if needed.    Patients discharged the day of surgery will not be allowed to drive home, and someone needs to stay with them for 24 hours.  SURGICAL WAITING ROOM VISITATION Patients may have no more than 2 support people in the waiting area - these visitors may rotate.   Pre-op nurse will coordinate an appropriate time for 1 ADULT support person, who may not rotate, to accompany patient in pre-op.  Children under the age of 56 must have an adult with them who is not the patient and must remain in the main waiting area with an adult.  If the patient needs to stay at the hospital during part of their recovery, the visitor guidelines for inpatient rooms apply.  Please refer to the [facility] website for the visitor guidelines for any additional information.   If you received a COVID test during your pre-op visit  it is requested that you wear a mask when out in public, stay away from anyone that may not be feeling well and notify your surgeon if you develop symptoms. If you have been in contact with anyone that has tested positive in the last 10 days please notify you surgeon.  Pre-operative 5 CHG Bathing Instructions   You can play a key role in reducing the risk of infection after surgery. Your skin needs to be as free of germs as possible. You can reduce the number of germs on your skin by washing with CHG (chlorhexidine  gluconate) soap before surgery. CHG is an antiseptic soap that kills germs and continues to kill germs even after washing.   DO NOT use if you have an allergy to chlorhexidine /CHG or antibacterial soaps. If your skin becomes reddened or irritated, stop using the CHG and notify one  of our RNs at [phone].   Please shower with the CHG soap starting 4 days before surgery using the following schedule:     Please keep in mind the following:  DO NOT shave, including legs and underarms, starting the day of your first shower.   You may shave your face at any point before/day of surgery.  Place clean sheets on your bed the day you start using CHG soap. Use a clean washcloth (not used since being washed) for each shower. DO NOT sleep with pets once you start using the CHG.   CHG Shower Instructions:  Wash your face and private area with normal soap. If you choose to wash your hair, wash first with your normal shampoo.  After you use shampoo/soap, rinse your hair and body thoroughly to remove shampoo/soap residue.  Turn the water OFF and apply about 3 tablespoons (45 ml) of CHG soap to a CLEAN washcloth.  Apply CHG soap ONLY FROM YOUR NECK DOWN TO YOUR TOES (washing for 3-5 minutes)  DO NOT use CHG soap on face, private areas, open wounds, or sores.  Pay special attention to the area where your surgery is being performed.  If you are having back surgery, having someone wash your back for you may be helpful. Wait 2 minutes after CHG soap is applied, then you may rinse off the CHG soap.  Pat dry with a clean towel  Put on clean clothes/pajamas   If you choose to wear lotion, please use ONLY the CHG-compatible lotions that are listed below.  Additional instructions for the day of surgery: DO NOT APPLY any lotions, deodorants, cologne, or perfumes.   Do not bring valuables to the hospital. [facility] is not responsible for any belongings/valuables. Do not wear nail polish, gel polish, artificial nails, or any other type of covering on natural nails (fingers and toes) Do not wear jewelry or makeup Put on clean/comfortable clothes.  Please brush your teeth.  Ask your nurse before applying any prescription medications to the skin.     CHG Compatible Lotions   Aveeno  Moisturizing lotion  Cetaphil Moisturizing Cream  Cetaphil Moisturizing Lotion  Clairol Herbal Essence Moisturizing Lotion, Dry Skin  Clairol Herbal Essence Moisturizing Lotion, Extra Dry Skin  Clairol Herbal Essence Moisturizing Lotion, Normal Skin  Curel Age Defying Therapeutic Moisturizing Lotion with Alpha Hydroxy  Curel Extreme Care Body Lotion  Curel Soothing Hands Moisturizing Hand Lotion  Curel Therapeutic Moisturizing Cream, Fragrance-Free  Curel Therapeutic Moisturizing Lotion, Fragrance-Free  Curel Therapeutic Moisturizing Lotion, Original Formula  Eucerin Daily Replenishing Lotion  Eucerin Dry Skin Therapy Plus Alpha Hydroxy Crme  Eucerin Dry Skin Therapy Plus Alpha Hydroxy Lotion  Eucerin Original Crme  Eucerin Original Lotion  Eucerin Plus Crme Eucerin Plus Lotion  Eucerin TriLipid Replenishing Lotion  Keri Anti-Bacterial Hand Lotion  Keri Deep Conditioning Original Lotion Dry Skin Formula Softly Scented  Keri Deep Conditioning Original Lotion, Fragrance Free Sensitive  Skin Formula  Keri Lotion Fast Absorbing Fragrance Free Sensitive Skin Formula  Keri Lotion Fast Absorbing Softly Scented Dry Skin Formula  Keri Original Lotion  Keri Skin Renewal Lotion Keri Silky Smooth Lotion  Keri Silky Smooth Sensitive Skin Lotion  Nivea Body Creamy Conditioning Oil  Nivea Body Extra Enriched Lotion  Nivea Body Original Lotion  Nivea Body Sheer Moisturizing Lotion Nivea Crme  Nivea Skin Firming Lotion  NutraDerm 30 Skin Lotion  NutraDerm Skin Lotion  NutraDerm Therapeutic Skin Cream  NutraDerm Therapeutic Skin Lotion  ProShield Protective Hand Cream  Provon moisturizing lotion  Please read over the following fact sheets that you were given.

## 2023-10-31 ENCOUNTER — Other Ambulatory Visit: Payer: Self-pay

## 2023-10-31 ENCOUNTER — Encounter (HOSPITAL_COMMUNITY): Payer: Self-pay

## 2023-10-31 ENCOUNTER — Encounter (HOSPITAL_COMMUNITY)
Admission: RE | Admit: 2023-10-31 | Discharge: 2023-10-31 | Disposition: A | Discharge status: Home / Self Care | Source: Ambulatory Visit | Attending: Orthopaedic Surgery | Admitting: Orthopaedic Surgery

## 2023-10-31 VITALS — BP 128/86 | HR 102 | Temp 98.7°F | Resp 17 | Ht 65.0 in | Wt 234.6 lb

## 2023-10-31 DIAGNOSIS — Z01818 Encounter for other preprocedural examination: Secondary | ICD-10-CM

## 2023-10-31 DIAGNOSIS — M1611 Unilateral primary osteoarthritis, right hip: Secondary | ICD-10-CM | POA: Insufficient documentation

## 2023-10-31 DIAGNOSIS — Z01812 Encounter for preprocedural laboratory examination: Secondary | ICD-10-CM | POA: Insufficient documentation

## 2023-10-31 DIAGNOSIS — B182 Chronic viral hepatitis C: Secondary | ICD-10-CM | POA: Insufficient documentation

## 2023-10-31 HISTORY — DX: Other complications of anesthesia, initial encounter: T88.59XA

## 2023-10-31 HISTORY — DX: Unspecified osteoarthritis, unspecified site: M19.90

## 2023-10-31 LAB — COMPREHENSIVE METABOLIC PANEL WITH GFR
ALT: 17 U/L (ref 0–44)
AST: 19 U/L (ref 15–41)
Albumin: 3.5 g/dL (ref 3.5–5.0)
Alkaline Phosphatase: 54 U/L (ref 38–126)
Anion gap: 9 (ref 5–15)
BUN: 7 mg/dL — ABNORMAL LOW (ref 8–23)
CO2: 27 mmol/L (ref 22–32)
Calcium: 9.4 mg/dL (ref 8.9–10.3)
Chloride: 101 mmol/L (ref 98–111)
Creatinine, Ser: 0.99 mg/dL (ref 0.44–1.00)
GFR, Estimated: >60 mL/min (ref >60)
Glucose, Bld: 94 mg/dL (ref 70–99)
Potassium: 3.9 mmol/L (ref 3.5–5.1)
Sodium: 137 mmol/L (ref 135–145)
Total Bilirubin: 0.9 mg/dL (ref 0.0–1.2)
Total Protein: 6.5 g/dL (ref 6.5–8.1)

## 2023-10-31 LAB — CBC
HCT: 40.2 % (ref 36.0–46.0)
Hemoglobin: 13.1 g/dL (ref 12.0–15.0)
MCH: 31.7 pg (ref 26.0–34.0)
MCHC: 32.6 g/dL (ref 30.0–36.0)
MCV: 97.3 fL (ref 80.0–100.0)
Platelets: 298 10*3/uL (ref 150–400)
RBC: 4.13 MIL/uL (ref 3.87–5.11)
RDW: 13.2 % (ref 11.5–15.5)
WBC: 9.5 10*3/uL (ref 4.0–10.5)
nRBC: 0 % (ref 0.0–0.2)

## 2023-10-31 LAB — SURGICAL PCR SCREEN
MRSA, PCR: NEGATIVE
Staphylococcus aureus: NEGATIVE

## 2023-10-31 NOTE — Progress Notes (Signed)
 PCP - Dr. Dewitt Forehand Cardiologist - denies  PPM/ICD - denies   Chest x-ray - 07/02/23 EKG - 06/26/23 Stress Test - 10+ years ago per pt, normal per pt, in [place] with Dr. Beau Bound ECHO - denies Cardiac Cath - denies  Sleep Study - denies   DM- denies  Last dose of GLP1 agonist-  n/a   ASA/Blood Thinner Instructions: n/a   ERAS Protcol - clears until 0530 PRE-SURGERY Ensure given at PAT  COVID TEST- n/a   Anesthesia review: no  Patient denies shortness of breath, fever, cough and chest pain at PAT appointment   All instructions explained to the patient, with a verbal understanding of the material. Patient agrees to go over the instructions while at home for a better understanding. The opportunity to ask questions was provided.

## 2023-11-10 NOTE — Anesthesia Preprocedure Evaluation (Signed)
 Anesthesia Evaluation  Patient identified by MRN, date of birth, ID band Patient awake    Reviewed: Allergy & Precautions, NPO status , Patient's Chart, lab work & pertinent test results  History of Anesthesia Complications Negative for: history of anesthetic complications  Airway Mallampati: III  TM Distance: >3 FB Neck ROM: Full    Dental  (+) Lower Dentures, Upper Dentures   Pulmonary asthma , COPD,  COPD inhaler, Current Smoker and Patient abstained from smoking.   Pulmonary exam normal        Cardiovascular hypertension, Pt. on medications Normal cardiovascular exam     Neuro/Psych  PSYCHIATRIC DISORDERS Anxiety Depression     Bell's palsy     GI/Hepatic hiatal hernia,GERD  Controlled,,(+)     substance abuse  cocaine use, Hepatitis -, C  Endo/Other  Hypothyroidism   Obesity   Renal/GU negative Renal ROS     Musculoskeletal  (+) Arthritis , Osteoarthritis,  Fibromyalgia -  Abdominal   Peds  Hematology negative hematology ROS (+)   Anesthesia Other Findings Hx violation of controlled substance agreement   Reproductive/Obstetrics                             Anesthesia Physical Anesthesia Plan  ASA: 3  Anesthesia Plan: Spinal   Post-op Pain Management: Tylenol  PO (pre-op)* and Dilaudid IV   Induction:   PONV Risk Score and Plan: 1 and Treatment may vary due to age or medical condition and Propofol  infusion  Airway Management Planned: Natural Airway and Simple Face Mask  Additional Equipment: None  Intra-op Plan:   Post-operative Plan:   Informed Consent: I have reviewed the patients History and Physical, chart, labs and discussed the procedure including the risks, benefits and alternatives for the proposed anesthesia with the patient or authorized representative who has indicated his/her understanding and acceptance.       Plan Discussed with: CRNA and  Anesthesiologist  Anesthesia Plan Comments: (Labs reviewed, platelets acceptable. Discussed risks and benefits of spinal, including spinal/epidural hematoma, infection, failed block, and PDPH. Patient expressed understanding and wished to proceed. )       Anesthesia Quick Evaluation

## 2023-11-10 NOTE — H&P (Signed)
 TOTAL HIP ADMISSION H&P  Patient is admitted for right total hip arthroplasty.  Subjective:  Chief Complaint: right hip pain  HPI: Danielle [name], 63 y.o. female, has a history of pain and functional disability in the right hip(s) due to arthritis and patient has failed non-surgical conservative treatments for greater than 12 weeks to include NSAID's and/or analgesics, corticosteriod injections, flexibility and strengthening excercises, use of assistive devices, weight reduction as appropriate, and activity modification.  Onset of symptoms was gradual starting several years ago with gradually worsening course since that time.The patient noted no past surgery on the right hip(s).  Patient currently rates pain in the right hip at 10 out of 10 with activity. Patient has night pain, worsening of pain with activity and weight bearing, pain that interfers with activities of daily living, and pain with passive range of motion. Patient has evidence of subchondral sclerosis, periarticular osteophytes, and joint space narrowing by imaging studies including MRI of the right hip. This condition presents safety issues increasing the risk of falls. .  There is no current active infection.  Patient Active Problem List   Diagnosis Date Noted   Abnormal CT scan, cervical spine (07/18/2021) 12/19/2022   Failure to attend appointment 10/01/2022   Lumbar facet joint pain 09/17/2022   Osteoarthritis of facet joint of lumbar spine 09/17/2022   Chronic low back pain (Bilateral) (R>L) w/o sciatica 08/29/2022   Somatic dysfunction of sacroiliac joint (Right) 04/25/2022   Sacroiliac joint dysfunction (Right) 04/25/2022   Right buttock pain 01/31/2022   Left thigh pain 01/21/2022   Violation of controlled substance agreement 01/21/2022   Abnormal drug screen (11/21/2021) 01/17/2022   Cocaine use 01/17/2022   Chronic hip pain (Left) 09/05/2021   Chronic groin pain (Left) 09/05/2021   Cervicalgia 06/19/2021   Chronic  neck pain with history of cervical spinal surgery 06/19/2021   COPD exacerbation (HCC) 04/10/2021   Chronic shoulder pain (Left) 02/07/2021   Chronic use of opiate for therapeutic purpose 11/28/2020   Acetabular labrum tear, sequela (Left) 10/09/2020   Acetabular labrum tear, sequela (Right) 10/09/2020   Greater trochanteric bursitis of hips (Bilateral) 10/09/2020   Epidural fibrosis (Left: L5-S1) 10/09/2020   Chronic groin pain (Bilateral) 10/09/2020   Abnormal MRI, lumbar spine (09/28/2020) 10/09/2020   Abnormal MRI, hip (Bilateral) (10/04/2020) 10/09/2020   DDD (degenerative disc disease), cervical 09/11/2020   DDD (degenerative disc disease), lumbosacral 09/11/2020   Other intervertebral disc degeneration, lumbar region 09/11/2020   Chronic sacroiliac joint pain (Left) 07/06/2020   Other spondylosis, sacral and sacrococcygeal region 07/06/2020   Sacroiliac joint dysfunction (Left) 07/06/2020   Somatic dysfunction of sacroiliac joint (Left) 07/06/2020   Enthesopathy of sacroiliac joint (Left) 07/06/2020   History of multiple allergies 07/06/2020   Chronic hip pain (Right) 06/01/2020   Unilateral primary osteoarthritis, right hip 06/01/2020   History of drug-induced anaphylaxis (Contrast Dye) 06/01/2020   Uncomplicated opioid dependence (HCC) 01/10/2020   Asthma 12/31/2019   Leg edema, left 12/31/2019   Post-nasal drip 12/01/2019   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   History of total replacement of shoulder joints (Bilateral) 10/12/2019   Chronic shoulder pain after replacement 05/13/2019   Osteoarthritis involving multiple joints 01/20/2019   Pharmacologic therapy 06/03/2018   Disorder of skeletal system 06/03/2018   Problems influencing health status 06/03/2018   Chronic trochanteric bursitis (Bilateral) 06/03/2018   Elevated sed rate 06/03/2018   Elevated vitamin B12 level 06/03/2018   Trochanteric bursitis of hip (Right) 06/03/2018  History of  allergy to iodine & radiographic dye 04/30/2018   URI (upper respiratory infection) 04/08/2018   Cellulitis of leg, right 03/25/2018   Abnormal mammogram of left breast 10/06/2017   Spondylosis without myelopathy or radiculopathy, lumbosacral region 09/24/2017   Osteoarthritis of shoulder (Bilateral) 08/19/2017   Morbid (severe) obesity due to excess calories (HCC) 07/24/2017   Chronic hip pain (2ry area of Pain) (Bilateral) (R>L) 06/11/2017   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) 06/11/2017   Trigger point with back pain 06/11/2017   Arthralgia of hip 06/11/2017   Myofascial pain 05/27/2017   Nausea 01/13/2017   Vasovagal episode 01/13/2017   Encounter for screening colonoscopy 09/12/2016   Rectal bleeding 09/12/2016   Cervical facet syndrome (HCC) 09/05/2016   Chronic musculoskeletal pain 09/05/2016   Muscle spasm of back 09/05/2016   Chronic pain syndrome 07/25/2016   Complaints of weakness of lower extremity 07/25/2016   Morbid obesity with BMI of 40.0-44.9, adult (HCC) 07/25/2016   Anterolisthesis of lumbar spine (L4/L5) 07/25/2016   Chronic pain disorder 07/25/2016   Seasonal allergic rhinitis 03/01/2016   History of total arthroplasty of shoulder (Left) 03/01/2016   Chronic sacroiliac pain (Bilateral) (L>R) 01/11/2016   Chronic sacroiliac joint pain (Right) 01/11/2016   Long term prescription opiate use 01/02/2016   Chronic shoulder pain (1ry area of Pain) (Bilateral) (R>L) 01/02/2016   Chronic low back pain (3ry area of Pain) (Bilateral) (R>L) w/ sciatica (Right) 01/02/2016   Lumbar facet syndrome (Bilateral) (R>L) 01/02/2016   Lumbar spondylosis 01/02/2016   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   Chronic lower extremity pain (Right) 01/02/2016   Chronic neck pain (Bilateral) (R>L) 01/02/2016   Chronic cervical radicular pain (Right) 01/02/2016   Ulnar neuropathy (Left) 01/02/2016   Hx of cervical spine surgery 01/02/2016   Cervical spondylosis 01/02/2016    Encounter for therapeutic drug level monitoring 01/02/2016   Encounter for pain management planning 01/02/2016   Fibromyalgia 01/02/2016   Mucositis oral 09/12/2015   COPD (chronic obstructive pulmonary disease) (HCC) 01/02/2015   Current tobacco use 01/02/2015   Mixed simple and mucopurulent chronic bronchitis (HCC) 01/02/2015   Cervical spinal cord compression (HCC) (April, 2017) 10/21/2014   Spondylosis without myelopathy or radiculopathy, cervical region 10/21/2014   Cervical spinal stenosis 05/10/2014   Adiposity 03/22/2014   B12 deficiency 02/10/2014   Hypertension 12/11/2013   Depression 12/10/2013   Hypothyroid 12/10/2013   Past Medical History:  Diagnosis Date   Acid reflux    Anxiety    Arthritis    Asthma    Bell's palsy    Bursitis    Complication of anesthesia    "bladder didn't wake up for a few days after one surgery"   COPD (chronic obstructive pulmonary disease) (HCC)    Depression    Fibromyalgia    Heart murmur    pt previously evaluated by anesthesia (heart murmur as a child)   Hepatitis C    Hiatal hernia    Hyperlipidemia    Hypertension    Hypothyroidism    IBS (irritable bowel syndrome)    Insomnia    Osteoarthritis    Post-nasal drip     Past Surgical History:  Procedure Laterality Date   BACK SURGERY  2013   lumbar disk   CARPAL TUNNEL RELEASE Right    COLONOSCOPY WITH PROPOFOL  N/A 02/05/2017   Procedure: COLONOSCOPY WITH PROPOFOL ;  Surgeon: Marshall Skeeter, MD;  Location: [facility] ENDOSCOPY;  Service: Endoscopy;  Laterality: N/A;   ELBOW SURGERY Left  FOOT SURGERY Right    necksurgery  10/21/2014   spine neck fusion   NOSE SURGERY     PARTIAL HYSTERECTOMY     TOTAL SHOULDER REPLACEMENT Bilateral     No current facility-administered medications for this encounter.   Current Outpatient Medications  Medication Sig Dispense Refill Last Dose/Taking   albuterol  (PROVENTIL  HFA;VENTOLIN  HFA) 108 (90 Base) MCG/ACT inhaler Inhale 2 puffs  into the lungs every 4 (four) hours as needed for wheezing or shortness of breath.    Taking As Needed   cetirizine (ZYRTEC) 10 MG tablet Take 10 mg by mouth at bedtime.   Taking   Cholecalciferol (VITAMIN D3) 50 MCG (2000 UT) capsule Take 2,000 Units by mouth at bedtime.   Taking   citalopram (CELEXA) 40 MG tablet Take 40 mg by mouth at bedtime.   Taking   cyclobenzaprine  (FLEXERIL ) 10 MG tablet Take 10 mg by mouth at bedtime.   Taking   dextromethorphan-guaiFENesin  (MUCINEX  DM) 30-600 MG 12hr tablet Take 1 tablet by mouth 2 (two) times daily.   Taking   docusate sodium (COLACE) 100 MG capsule Take 100 mg by mouth daily as needed for moderate constipation.   Taking As Needed   fluticasone  (FLONASE) 50 MCG/ACT nasal spray Place 1 spray into both nostrils daily.   Taking   ipratropium-albuterol  (DUONEB) 0.5-2.5 (3) MG/3ML SOLN Take 3 mLs by nebulization every 6 (six) hours as needed. 360 mL 0 Taking As Needed   levothyroxine (SYNTHROID, LEVOTHROID) 50 MCG tablet Take 50 mcg by mouth daily before breakfast.  11 Taking   losartan (COZAAR) 25 MG tablet Take 25 mg by mouth daily.   Taking   Menthol, Topical Analgesic, (BENGAY EX) Apply 1 Application topically daily as needed (pain).   Taking As Needed   SPIRIVA HANDIHALER 18 MCG inhalation capsule Place 18 mcg into inhaler and inhale in the morning.   Taking   SYMBICORT 160-4.5 MCG/ACT inhaler Inhale 2 puffs into the lungs daily.   Taking   Allergies  Allergen Reactions   Aripiprazole Swelling   Duloxetine Swelling    Clogged her up - constipation, feet swelling.   Gabapentin Swelling    Feet swell.   Gold Hives and Itching    Itching 45 minutes after taking it & then broke out in hives. Was put on Prednisone . Treated for 5 months.   Iodinated Contrast Media Anaphylaxis    Anaphylactic reaction first encounter with myelogram. Had myelogram, given IV dye, had no heart beat, no pulse.   Naproxen Sodium Other (See Comments)    GI bleeding.    Nortriptyline Hcl Swelling    Made her hoarse, throat swelling when she took 100 mg.   Nsaids Other (See Comments)    GI bleed.   Pregabalin Swelling    Feet swelling.   Meperidine Hives    Meperidine is the generic name for Demerol   Ace Inhibitors     Other reaction(s): Other (See Comments) Unknown   Aspirin Other (See Comments)    Bothers her stomach.   Cefpodoxime     Other reaction(s): Other (See Comments) Bad thrush.   Fluoxetine Diarrhea    Constant runs.   Fluticasone -Salmeterol Other (See Comments)    Blisters in mouth Advair   Levofloxacin Other (See Comments)    Unknown, probably stomach issue.   Lithium Other (See Comments)    Made her stare at the walls & cry.   Paroxetine     Other reaction(s): Other (See Comments) Unknown  Other reaction(s): Unknown   Tape Other (See Comments)    Skin peels off   Telithromycin Other (See Comments)    Blisters in mouth plus thrush.   Theophylline Other (See Comments)    Unknown   Topiramate Other (See Comments)    Vomiting and increased depression   Trazodone Other (See Comments)    Blisters lips very bad.   Triamcinolone  Other (See Comments)    Mouth blistered down her throat even if she rinsed her mouth.   Venlafaxine Diarrhea    Took for years & did fine. Dropped electrolytes because it caused a lot of diarrhea.   Bupropion Rash   Ketorolac  Tromethamine  Nausea And Vomiting    Other reaction(s): Other (See Comments) Puking & headache   Morphine Nausea And Vomiting    Intermittent nausea & vomiting with pills, but could tolerate the liquid form.   Moxifloxacin Nausea And Vomiting    Bad thrush.    Social History   Tobacco Use   Smoking status: Every Day    Current packs/day: 2.00    Average packs/day: 2.0 packs/day for 40.0 years (80.0 ttl pk-yrs)    Types: Cigarettes   Smokeless tobacco: Never   Tobacco comments:    1/2 ppd 12/08/19//lmr  Substance Use Topics   Alcohol use: Not Currently    Comment: maybe  2-3 drinks per year    Family History  Problem Relation Age of Onset   Gout Mother    Cancer Mother    Heart disease Father      Review of Systems  Objective:  Physical Exam Vitals reviewed.  Constitutional:      Appearance: Normal appearance. She is obese.  HENT:     Head: Normocephalic and atraumatic.  Eyes:     Extraocular Movements: Extraocular movements intact.     Pupils: Pupils are equal, round, and reactive to light.  Cardiovascular:     Rate and Rhythm: Normal rate and regular rhythm.  Pulmonary:     Effort: Pulmonary effort is normal.     Breath sounds: Normal breath sounds.  Abdominal:     Palpations: Abdomen is soft.  Musculoskeletal:     Cervical back: Normal range of motion and neck supple.     Right hip: Tenderness and bony tenderness present. Decreased range of motion. Decreased strength.  Neurological:     Mental Status: She is alert and oriented to person, place, and time.  Psychiatric:        Behavior: Behavior normal.     Vital signs in last 24 hours:    Labs:   Estimated body mass index is 39.04 kg/m as calculated from the following:   Height as of 10/31/23: 5\' 5"  (1.651 m).   Weight as of 10/31/23: 106.4 kg.   Imaging Review A MRI of the right hip demonstrates severe degenerative joint disease of the right hip(s). The bone quality appears to be excellent for age and reported activity level.      Assessment/Plan:  End stage arthritis, right hip(s)  The patient history, physical examination, clinical judgement of the provider and imaging studies are consistent with end stage degenerative joint disease of the right hip(s) and total hip arthroplasty is deemed medically necessary. The treatment options including medical management, injection therapy, arthroscopy and arthroplasty were discussed at length. The risks and benefits of total hip arthroplasty were presented and reviewed. The risks due to aseptic loosening, infection, stiffness,  dislocation/subluxation,  thromboembolic complications and other imponderables were discussed.  The  patient acknowledged the explanation, agreed to proceed with the plan and consent was signed. Patient is being admitted for inpatient treatment for surgery, pain control, PT, OT, prophylactic antibiotics, VTE prophylaxis, progressive ambulation and ADL's and discharge planning.The patient is planning to be discharged home with home health services

## 2023-11-11 ENCOUNTER — Observation Stay (HOSPITAL_BASED_OUTPATIENT_CLINIC_OR_DEPARTMENT_OTHER): Payer: Self-pay | Admitting: Registered Nurse

## 2023-11-11 ENCOUNTER — Observation Stay (HOSPITAL_COMMUNITY)
Admission: RE | Admit: 2023-11-11 | Discharge: 2023-11-12 | Disposition: A | Discharge status: Home / Self Care | Attending: Orthopaedic Surgery | Admitting: Orthopaedic Surgery

## 2023-11-11 ENCOUNTER — Observation Stay (HOSPITAL_COMMUNITY): Payer: Self-pay | Admitting: Registered Nurse

## 2023-11-11 ENCOUNTER — Observation Stay (HOSPITAL_COMMUNITY)

## 2023-11-11 ENCOUNTER — Encounter (HOSPITAL_COMMUNITY): Admission: RE | Payer: Self-pay | Source: Home / Self Care

## 2023-11-11 ENCOUNTER — Other Ambulatory Visit: Payer: Self-pay

## 2023-11-11 DIAGNOSIS — M1611 Unilateral primary osteoarthritis, right hip: Secondary | ICD-10-CM

## 2023-11-11 DIAGNOSIS — M1612 Unilateral primary osteoarthritis, left hip: Secondary | ICD-10-CM | POA: Diagnosis present

## 2023-11-11 DIAGNOSIS — Z96612 Presence of left artificial shoulder joint: Secondary | ICD-10-CM | POA: Insufficient documentation

## 2023-11-11 DIAGNOSIS — F17210 Nicotine dependence, cigarettes, uncomplicated: Secondary | ICD-10-CM | POA: Diagnosis not present

## 2023-11-11 DIAGNOSIS — Z96611 Presence of right artificial shoulder joint: Secondary | ICD-10-CM | POA: Diagnosis not present

## 2023-11-11 DIAGNOSIS — J449 Chronic obstructive pulmonary disease, unspecified: Secondary | ICD-10-CM | POA: Diagnosis not present

## 2023-11-11 DIAGNOSIS — I10 Essential (primary) hypertension: Secondary | ICD-10-CM | POA: Diagnosis not present

## 2023-11-11 DIAGNOSIS — E039 Hypothyroidism, unspecified: Secondary | ICD-10-CM | POA: Diagnosis not present

## 2023-11-11 DIAGNOSIS — M169 Osteoarthritis of hip, unspecified: Secondary | ICD-10-CM | POA: Diagnosis present

## 2023-11-11 DIAGNOSIS — Z96641 Presence of right artificial hip joint: Secondary | ICD-10-CM

## 2023-11-11 DIAGNOSIS — Z79899 Other long term (current) drug therapy: Secondary | ICD-10-CM | POA: Diagnosis not present

## 2023-11-11 HISTORY — PX: TOTAL HIP ARTHROPLASTY: SHX124

## 2023-11-11 SURGERY — ARTHROPLASTY, HIP, TOTAL, ANTERIOR APPROACH
Anesthesia: Spinal | Site: Hip | Laterality: Right

## 2023-11-11 MED ORDER — ALBUTEROL SULFATE HFA 108 (90 BASE) MCG/ACT IN AERS
INHALATION_SPRAY | RESPIRATORY_TRACT | Status: AC
  Filled 2023-11-11: qty 6.7

## 2023-11-11 MED ORDER — ROCURONIUM BROMIDE 10 MG/ML (PF) SYRINGE
PREFILLED_SYRINGE | INTRAVENOUS | Status: AC
  Filled 2023-11-11: qty 10

## 2023-11-11 MED ORDER — FENTANYL CITRATE (PF) 250 MCG/5ML IJ SOLN
INTRAMUSCULAR | Status: AC
  Filled 2023-11-11: qty 5

## 2023-11-11 MED ORDER — TIOTROPIUM BROMIDE MONOHYDRATE 18 MCG IN CAPS: 18.0000 ug | ORAL_CAPSULE | Freq: Every morning | RESPIRATORY_TRACT | Status: DC

## 2023-11-11 MED ORDER — LIDOCAINE 2% (20 MG/ML) 5 ML SYRINGE
INTRAMUSCULAR | Status: AC
  Filled 2023-11-11: qty 5

## 2023-11-11 MED ORDER — ONDANSETRON HCL 4 MG PO TABS: 4.0000 mg | ORAL_TABLET | Freq: Four times a day (QID) | ORAL | Status: DC | PRN

## 2023-11-11 MED ORDER — MIDAZOLAM HCL 2 MG/2ML IJ SOLN
INTRAMUSCULAR | Status: AC
  Filled 2023-11-11: qty 2

## 2023-11-11 MED ORDER — DEXAMETHASONE SODIUM PHOSPHATE 10 MG/ML IJ SOLN
INTRAMUSCULAR | Status: DC | PRN
  Administered 2023-11-11: 5 mg via INTRAVENOUS

## 2023-11-11 MED ORDER — CEFAZOLIN SODIUM-DEXTROSE 2-4 GM/100ML-% IV SOLN
2.0000 g | Freq: Four times a day (QID) | INTRAVENOUS | Status: AC
  Administered 2023-11-11 (×2): 2 g via INTRAVENOUS
  Filled 2023-11-11 (×2): qty 100

## 2023-11-11 MED ORDER — ONDANSETRON HCL 4 MG/2ML IJ SOLN: 4.0000 mg | Freq: Four times a day (QID) | INTRAMUSCULAR | Status: DC | PRN

## 2023-11-11 MED ORDER — ORAL CARE MOUTH RINSE: 15.0000 mL | Freq: Once | OROMUCOSAL | Status: AC

## 2023-11-11 MED ORDER — ALBUMIN HUMAN 5 % IV SOLN: INTRAVENOUS | Status: DC | PRN

## 2023-11-11 MED ORDER — OXYCODONE HCL 5 MG PO TABS
10.0000 mg | ORAL_TABLET | ORAL | Status: DC | PRN
  Administered 2023-11-11 – 2023-11-12 (×5): 15 mg via ORAL
  Filled 2023-11-11 (×5): qty 3

## 2023-11-11 MED ORDER — PANTOPRAZOLE SODIUM 40 MG PO TBEC
40.0000 mg | DELAYED_RELEASE_TABLET | Freq: Every day | ORAL | Status: DC
  Administered 2023-11-11 – 2023-11-12 (×2): 40 mg via ORAL
  Filled 2023-11-11 (×2): qty 1

## 2023-11-11 MED ORDER — SODIUM CHLORIDE (PF) 0.9 % IJ SOLN
INTRAMUSCULAR | Status: AC
  Filled 2023-11-11: qty 20

## 2023-11-11 MED ORDER — PROPOFOL 10 MG/ML IV BOLUS
INTRAVENOUS | Status: AC
  Filled 2023-11-11: qty 20

## 2023-11-11 MED ORDER — FENTANYL CITRATE (PF) 250 MCG/5ML IJ SOLN
INTRAMUSCULAR | Status: DC | PRN
  Administered 2023-11-11: 50 ug via INTRAVENOUS

## 2023-11-11 MED ORDER — CITALOPRAM HYDROBROMIDE 20 MG PO TABS
40.0000 mg | ORAL_TABLET | Freq: Every day | ORAL | Status: DC
  Administered 2023-11-11: 40 mg via ORAL
  Filled 2023-11-11: qty 2

## 2023-11-11 MED ORDER — 0.9 % SODIUM CHLORIDE (POUR BTL) OPTIME
TOPICAL | Status: DC | PRN
  Administered 2023-11-11: 1000 mL

## 2023-11-11 MED ORDER — CYCLOBENZAPRINE HCL 10 MG PO TABS
10.0000 mg | ORAL_TABLET | Freq: Three times a day (TID) | ORAL | Status: DC | PRN
  Administered 2023-11-11 – 2023-11-12 (×2): 10 mg via ORAL
  Filled 2023-11-11 (×3): qty 1

## 2023-11-11 MED ORDER — TRANEXAMIC ACID-NACL 1000-0.7 MG/100ML-% IV SOLN
1000.0000 mg | INTRAVENOUS | Status: AC
  Administered 2023-11-11: 1000 mg via INTRAVENOUS
  Filled 2023-11-11: qty 100

## 2023-11-11 MED ORDER — CHLORHEXIDINE GLUCONATE 0.12 % MT SOLN
15.0000 mL | Freq: Once | OROMUCOSAL | Status: AC
  Administered 2023-11-11: 15 mL via OROMUCOSAL
  Filled 2023-11-11: qty 15

## 2023-11-11 MED ORDER — HYDROMORPHONE HCL 1 MG/ML IJ SOLN
INTRAMUSCULAR | Status: AC
  Filled 2023-11-11: qty 0.5

## 2023-11-11 MED ORDER — FENTANYL CITRATE (PF) 100 MCG/2ML IJ SOLN
25.0000 ug | INTRAMUSCULAR | Status: DC | PRN
  Administered 2023-11-11 (×2): 50 ug via INTRAVENOUS

## 2023-11-11 MED ORDER — IPRATROPIUM-ALBUTEROL 0.5-2.5 (3) MG/3ML IN SOLN
3.0000 mL | Freq: Once | RESPIRATORY_TRACT | Status: AC
  Administered 2023-11-11: 3 mL via RESPIRATORY_TRACT

## 2023-11-11 MED ORDER — OXYCODONE HCL 5 MG PO TABS
ORAL_TABLET | ORAL | Status: AC
  Filled 2023-11-11: qty 2

## 2023-11-11 MED ORDER — IPRATROPIUM-ALBUTEROL 0.5-2.5 (3) MG/3ML IN SOLN
RESPIRATORY_TRACT | Status: AC
Start: 2023-11-11 — End: 2023-11-11
  Filled 2023-11-11: qty 3

## 2023-11-11 MED ORDER — VANCOMYCIN HCL 1500 MG/300ML IV SOLN
1500.0000 mg | INTRAVENOUS | Status: AC
  Administered 2023-11-11: 1500 mg via INTRAVENOUS
  Filled 2023-11-11: qty 300

## 2023-11-11 MED ORDER — MIDAZOLAM HCL 2 MG/2ML IJ SOLN
INTRAMUSCULAR | Status: DC | PRN
  Administered 2023-11-11: 2 mg via INTRAVENOUS

## 2023-11-11 MED ORDER — VASOPRESSIN 20 UNIT/ML IV SOLN
INTRAVENOUS | Status: AC
  Filled 2023-11-11: qty 1

## 2023-11-11 MED ORDER — LOSARTAN POTASSIUM 25 MG PO TABS
25.0000 mg | ORAL_TABLET | Freq: Every day | ORAL | Status: DC
  Administered 2023-11-11 – 2023-11-12 (×2): 25 mg via ORAL
  Filled 2023-11-11 (×2): qty 1

## 2023-11-11 MED ORDER — VITAMIN D 25 MCG (1000 UNIT) PO TABS
2000.0000 [IU] | ORAL_TABLET | Freq: Every day | ORAL | Status: DC
  Administered 2023-11-11: 2000 [IU] via ORAL
  Filled 2023-11-11: qty 2

## 2023-11-11 MED ORDER — EPHEDRINE SULFATE-NACL 50-0.9 MG/10ML-% IV SOSY
PREFILLED_SYRINGE | INTRAVENOUS | Status: DC | PRN
  Administered 2023-11-11 (×3): 5 mg via INTRAVENOUS

## 2023-11-11 MED ORDER — DEXAMETHASONE SODIUM PHOSPHATE 10 MG/ML IJ SOLN
INTRAMUSCULAR | Status: AC
  Filled 2023-11-11: qty 1

## 2023-11-11 MED ORDER — PHENYLEPHRINE HCL-NACL 20-0.9 MG/250ML-% IV SOLN
INTRAVENOUS | Status: DC | PRN
  Administered 2023-11-11: 25 ug/min via INTRAVENOUS

## 2023-11-11 MED ORDER — DOCUSATE SODIUM 100 MG PO CAPS
100.0000 mg | ORAL_CAPSULE | Freq: Two times a day (BID) | ORAL | Status: DC
  Administered 2023-11-11 – 2023-11-12 (×3): 100 mg via ORAL
  Filled 2023-11-11 (×3): qty 1

## 2023-11-11 MED ORDER — IPRATROPIUM-ALBUTEROL 0.5-2.5 (3) MG/3ML IN SOLN: 3.0000 mL | Freq: Four times a day (QID) | RESPIRATORY_TRACT | Status: DC | PRN

## 2023-11-11 MED ORDER — PHENYLEPHRINE 80 MCG/ML (10ML) SYRINGE FOR IV PUSH (FOR BLOOD PRESSURE SUPPORT)
PREFILLED_SYRINGE | INTRAVENOUS | Status: DC | PRN
  Administered 2023-11-11 (×2): 160 ug via INTRAVENOUS
  Administered 2023-11-11: 80 ug via INTRAVENOUS
  Administered 2023-11-11: 160 ug via INTRAVENOUS
  Administered 2023-11-11 (×3): 80 ug via INTRAVENOUS

## 2023-11-11 MED ORDER — LACTATED RINGERS IV SOLN
INTRAVENOUS | Status: DC
Start: 2023-11-11 — End: 2023-11-11

## 2023-11-11 MED ORDER — PHENOL 1.4 % MT LIQD: 1.0000 | OROMUCOSAL | Status: DC | PRN

## 2023-11-11 MED ORDER — HYDROMORPHONE HCL 1 MG/ML IJ SOLN
0.5000 mg | INTRAMUSCULAR | Status: DC | PRN
  Administered 2023-11-11: 1 mg via INTRAVENOUS
  Filled 2023-11-11: qty 1

## 2023-11-11 MED ORDER — METOCLOPRAMIDE HCL 5 MG PO TABS: 5.0000 mg | ORAL_TABLET | Freq: Three times a day (TID) | ORAL | Status: DC | PRN

## 2023-11-11 MED ORDER — FENTANYL CITRATE (PF) 100 MCG/2ML IJ SOLN
INTRAMUSCULAR | Status: AC
Start: 2023-11-11 — End: 2023-11-11
  Filled 2023-11-11: qty 2

## 2023-11-11 MED ORDER — ACETAMINOPHEN 500 MG PO TABS
1000.0000 mg | ORAL_TABLET | Freq: Once | ORAL | Status: DC
  Filled 2023-11-11: qty 2

## 2023-11-11 MED ORDER — OXYCODONE HCL 5 MG PO TABS: 5.0000 mg | ORAL_TABLET | Freq: Once | ORAL | Status: DC | PRN

## 2023-11-11 MED ORDER — METOCLOPRAMIDE HCL 5 MG/ML IJ SOLN: 5.0000 mg | Freq: Three times a day (TID) | INTRAMUSCULAR | Status: DC | PRN

## 2023-11-11 MED ORDER — ASPIRIN 81 MG PO CHEW
81.0000 mg | CHEWABLE_TABLET | Freq: Two times a day (BID) | ORAL | Status: DC
  Filled 2023-11-11 (×2): qty 1

## 2023-11-11 MED ORDER — ONDANSETRON HCL 4 MG/2ML IJ SOLN
INTRAMUSCULAR | Status: DC | PRN
  Administered 2023-11-11: 4 mg via INTRAVENOUS

## 2023-11-11 MED ORDER — PHENYLEPHRINE 80 MCG/ML (10ML) SYRINGE FOR IV PUSH (FOR BLOOD PRESSURE SUPPORT)
PREFILLED_SYRINGE | INTRAVENOUS | Status: AC
  Filled 2023-11-11: qty 10

## 2023-11-11 MED ORDER — HYDROMORPHONE HCL 1 MG/ML IJ SOLN
INTRAMUSCULAR | Status: DC | PRN
  Administered 2023-11-11: .5 mg via INTRAVENOUS

## 2023-11-11 MED ORDER — FENTANYL CITRATE (PF) 100 MCG/2ML IJ SOLN
INTRAMUSCULAR | Status: AC
  Filled 2023-11-11: qty 2

## 2023-11-11 MED ORDER — SODIUM CHLORIDE 0.9 % IV SOLN: INTRAVENOUS | Status: DC

## 2023-11-11 MED ORDER — PROPOFOL 10 MG/ML IV BOLUS
INTRAVENOUS | Status: DC | PRN
  Administered 2023-11-11 (×3): 20 mg via INTRAVENOUS
  Administered 2023-11-11: 50 ug/kg/min via INTRAVENOUS

## 2023-11-11 MED ORDER — EPHEDRINE 5 MG/ML INJ
INTRAVENOUS | Status: AC
  Filled 2023-11-11: qty 5

## 2023-11-11 MED ORDER — UMECLIDINIUM BROMIDE 62.5 MCG/ACT IN AEPB
1.0000 | INHALATION_SPRAY | Freq: Every day | RESPIRATORY_TRACT | Status: DC
  Filled 2023-11-11: qty 7

## 2023-11-11 MED ORDER — BUPIVACAINE IN DEXTROSE 0.75-8.25 % IT SOLN
INTRATHECAL | Status: DC | PRN
  Administered 2023-11-11: 1.6 mL via INTRATHECAL

## 2023-11-11 MED ORDER — MENTHOL 3 MG MT LOZG
1.0000 | LOZENGE | OROMUCOSAL | Status: DC | PRN
  Filled 2023-11-11: qty 9

## 2023-11-11 MED ORDER — LEVOTHYROXINE SODIUM 25 MCG PO TABS
50.0000 ug | ORAL_TABLET | Freq: Every day | ORAL | Status: DC
  Administered 2023-11-12: 50 ug via ORAL
  Filled 2023-11-11: qty 2

## 2023-11-11 MED ORDER — OXYCODONE HCL 5 MG/5ML PO SOLN: 5.0000 mg | Freq: Once | ORAL | Status: DC | PRN

## 2023-11-11 MED ORDER — GENTAMICIN SULFATE 40 MG/ML IJ SOLN
5.0000 mg/kg | INTRAVENOUS | Status: AC
  Administered 2023-11-11: 530 mg via INTRAVENOUS
  Filled 2023-11-11: qty 13.25

## 2023-11-11 MED ORDER — SODIUM CHLORIDE 0.9 % IR SOLN
Status: DC | PRN
  Administered 2023-11-11: 1000 mL

## 2023-11-11 MED ORDER — HYDROMORPHONE HCL 1 MG/ML IJ SOLN
0.2500 mg | INTRAMUSCULAR | Status: DC | PRN
  Administered 2023-11-11 (×4): 0.5 mg via INTRAVENOUS

## 2023-11-11 MED ORDER — FENTANYL CITRATE (PF) 100 MCG/2ML IJ SOLN
50.0000 ug | Freq: Once | INTRAMUSCULAR | Status: AC
  Administered 2023-11-11: 50 ug via INTRAVENOUS

## 2023-11-11 MED ORDER — ONDANSETRON HCL 4 MG/2ML IJ SOLN: 4.0000 mg | Freq: Once | INTRAMUSCULAR | Status: DC | PRN

## 2023-11-11 MED ORDER — HYDROMORPHONE HCL 1 MG/ML IJ SOLN
INTRAMUSCULAR | Status: AC
  Filled 2023-11-11: qty 1

## 2023-11-11 MED ORDER — ONDANSETRON HCL 4 MG/2ML IJ SOLN
INTRAMUSCULAR | Status: AC
  Filled 2023-11-11: qty 2

## 2023-11-11 MED ORDER — OXYCODONE HCL 5 MG PO TABS
5.0000 mg | ORAL_TABLET | ORAL | Status: DC | PRN
  Administered 2023-11-11: 10 mg via ORAL

## 2023-11-11 MED ORDER — HYDROMORPHONE HCL 1 MG/ML IJ SOLN
INTRAMUSCULAR | Status: AC
Start: 2023-11-11 — End: 2023-11-11
  Filled 2023-11-11: qty 1

## 2023-11-11 MED ORDER — ALUM & MAG HYDROXIDE-SIMETH 200-200-20 MG/5ML PO SUSP: 30.0000 mL | ORAL | Status: DC | PRN

## 2023-11-11 MED ORDER — ALBUTEROL SULFATE HFA 108 (90 BASE) MCG/ACT IN AERS
INHALATION_SPRAY | RESPIRATORY_TRACT | Status: DC | PRN
  Administered 2023-11-11: 6 via RESPIRATORY_TRACT

## 2023-11-11 MED ORDER — DIPHENHYDRAMINE HCL 12.5 MG/5ML PO ELIX: 12.5000 mg | ORAL_SOLUTION | ORAL | Status: DC | PRN

## 2023-11-11 SURGICAL SUPPLY — 45 items
BAG COUNTER SPONGE SURGICOUNT (BAG) ×1 IMPLANT
BENZOIN TINCTURE PRP APPL 2/3 (GAUZE/BANDAGES/DRESSINGS) ×1 IMPLANT
BLADE CLIPPER SURG (BLADE) IMPLANT
BLADE SAW SGTL 18X1.27X75 (BLADE) ×1 IMPLANT
COVER SURGICAL LIGHT HANDLE (MISCELLANEOUS) ×1 IMPLANT
CUP SECTOR GRIPTON 50MM (Cup) IMPLANT
DRAPE C-ARM 42X72 X-RAY (DRAPES) ×1 IMPLANT
DRAPE STERI IOBAN 125X83 (DRAPES) ×1 IMPLANT
DRAPE U-SHAPE 47X51 STRL (DRAPES) ×3 IMPLANT
DRSG AQUACEL AG ADV 3.5X10 (GAUZE/BANDAGES/DRESSINGS) ×1 IMPLANT
DRSG XEROFORM 1X8 (GAUZE/BANDAGES/DRESSINGS) IMPLANT
DURAPREP 26ML APPLICATOR (WOUND CARE) ×1 IMPLANT
ELECT BLADE 6.5 EXT (BLADE) IMPLANT
ELECTRODE BLDE 4.0 EZ CLN MEGD (MISCELLANEOUS) ×1 IMPLANT
ELECTRODE REM PT RTRN 9FT ADLT (ELECTROSURGICAL) ×1 IMPLANT
FACESHIELD WRAPAROUND (MASK) ×2 IMPLANT
FACESHIELD WRAPAROUND OR TEAM (MASK) ×2 IMPLANT
GLOVE BIOGEL PI IND STRL 8 (GLOVE) ×2 IMPLANT
GLOVE ECLIPSE 8.0 STRL XLNG CF (GLOVE) ×1 IMPLANT
GLOVE ORTHO TXT STRL SZ7.5 (GLOVE) ×2 IMPLANT
GOWN STRL REUS W/ TWL LRG LVL3 (GOWN DISPOSABLE) ×2 IMPLANT
GOWN STRL REUS W/ TWL XL LVL3 (GOWN DISPOSABLE) ×2 IMPLANT
HEAD FEMORAL 32 CERAMIC (Hips) IMPLANT
KIT BASIN OR (CUSTOM PROCEDURE TRAY) ×1 IMPLANT
KIT TURNOVER KIT B (KITS) ×1 IMPLANT
LINER ACET PNNCL PLUS4 NEUTRAL (Hips) IMPLANT
MANIFOLD NEPTUNE II (INSTRUMENTS) ×1 IMPLANT
NS IRRIG 1000ML POUR BTL (IV SOLUTION) ×1 IMPLANT
PACK TOTAL JOINT (CUSTOM PROCEDURE TRAY) ×1 IMPLANT
PAD ARMBOARD POSITIONER FOAM (MISCELLANEOUS) ×1 IMPLANT
SET HNDPC FAN SPRY TIP SCT (DISPOSABLE) ×1 IMPLANT
STAPLER VISISTAT 35W (STAPLE) IMPLANT
STEM FEM ACTIS STD SZ4 (Stem) IMPLANT
STRIP CLOSURE SKIN 1/2X4 (GAUZE/BANDAGES/DRESSINGS) ×2 IMPLANT
SUT ETHIBOND NAB CT1 #1 30IN (SUTURE) ×1 IMPLANT
SUT ETHILON 2 0 FS 18 (SUTURE) IMPLANT
SUT MNCRL AB 4-0 PS2 18 (SUTURE) IMPLANT
SUT VIC AB 0 CT1 27XBRD ANBCTR (SUTURE) ×1 IMPLANT
SUT VIC AB 1 CT1 27XBRD ANBCTR (SUTURE) ×1 IMPLANT
SUT VIC AB 2-0 CT1 TAPERPNT 27 (SUTURE) ×1 IMPLANT
TOWEL GREEN STERILE (TOWEL DISPOSABLE) ×1 IMPLANT
TOWEL GREEN STERILE FF (TOWEL DISPOSABLE) ×1 IMPLANT
TRAY CATH INTERMITTENT SS 16FR (CATHETERS) IMPLANT
TRAY FOLEY W/BAG SLVR 16FR ST (SET/KITS/TRAYS/PACK) IMPLANT
WATER STERILE IRR 1000ML POUR (IV SOLUTION) ×2 IMPLANT

## 2023-11-11 NOTE — Transfer of Care (Signed)
 Immediate Anesthesia Transfer of Care Note  Patient: Danielle [name]  Procedure(s) Performed: RIGHT TOTAL HIP ARTHROPLASTY, ANTERIOR APPROACH (Right: Hip)  Patient Location: PACU  Anesthesia Type:MAC and Spinal  Level of Consciousness: awake, alert , and oriented  Airway & Oxygen Therapy: Patient connected to nasal cannula oxygen  Post-op Assessment: Report given to RN, Post -op Vital signs reviewed and stable, and Patient moving all extremities X 4  Post vital signs: Reviewed and stable  Last Vitals:  Vitals Value Taken Time  BP 124/63 11/11/23 1100  Temp    Pulse 110 11/11/23 1103  Resp 18 11/11/23 1103  SpO2 92% 11/11/23 1103  Vitals shown include unfiled device data.  Last Pain:  Vitals:   11/11/23 0802  PainSc: 5       Patients Stated Pain Goal: 1 (11/11/23 0802)  Complications: There were no known notable events for this encounter.

## 2023-11-11 NOTE — Anesthesia Postprocedure Evaluation (Signed)
 Anesthesia Post Note  Patient: Danielle [name]  Procedure(s) Performed: RIGHT TOTAL HIP ARTHROPLASTY, ANTERIOR APPROACH (Right: Hip)     Patient location during evaluation: PACU Anesthesia Type: Spinal Level of consciousness: awake and alert Pain management: satisfactory to patient Vital Signs Assessment: post-procedure vital signs reviewed and stable Respiratory status: spontaneous breathing and respiratory function stable Cardiovascular status: blood pressure returned to baseline and stable Postop Assessment: spinal receding and no apparent nausea or vomiting Anesthetic complications: no   There were no known notable events for this encounter.  Last Vitals:  Vitals:   11/11/23 1257 11/11/23 1259  BP:    Pulse: (!) 114   Resp: (!) 27   Temp:  36.7 C  SpO2: 95% 94%           RLE Motor Response: Purposeful movement (11/11/23 1259) RLE Sensation: Full sensation (11/11/23 1259) L Sensory Level: S1-Sole of foot, small toes (11/11/23 1259) R Sensory Level: S1-Sole of foot, small toes (11/11/23 1259)  Juventino Oppenheim

## 2023-11-11 NOTE — Op Note (Addendum)
 Operative Note  Date of operation: 11/11/2023 Preoperative diagnosis: Right hip primary osteoarthritis Postoperative diagnosis: Same  Procedure: Right direct anterior total hip arthroplasty  Implants: Implant Name Type Inv. Item Serial No. Manufacturer Lot No. LRB No. Used Action  CUP SECTOR GRIPTON - [id number] Cup CUP SECTOR GRIPTON  [employer]PU[employer] [id number] Right 1 Implanted  LINER ACET PNNCL PLUS4 NEUTRAL - [id number] Hips LINER ACET PNNCL PLUS4 NEUTRAL  DEPUY ORTHOPAEDICS [id number] Right 1 Implanted  STEM FEM ACTIS STD SZ4 - [id number] Stem STEM FEM ACTIS STD SZ4  DEPUY ORTHOPA[employer] [id number] Right 1 Implanted  HEAD FEMORAL 32 CERAMIC - [id number] Hips HEAD FEMORAL 32 CERAMIC  DEPUY ORTH[employer] [id number] Right 1 Implanted   Surgeon: Jeanella Milan. Lucienne Ryder, MD Assistant: Malena Scull, PA-C  Anesthesia: Spinal Antibiotics: IV Ancef EBL: 200 cc Complications: None  Indications: The patient is a 63 year old morbidly obese female with debilitating arthritis involving her right hip this been well-documented with clinical exam and MRI findings.  She has tried and failed all forms of conservative treatment.  At this point her right hip pain is daily and it is detrimentally affecting her mobility, her quality of life and her actives daily living to the point she wished to proceed with a hip replacement.  We did discuss in length in detail the risks of acute blood loss anemia, nerve vessel injury, fracture, infection, DVT, dislocation, implant failure, leg length differences and wound healing issues.  She understands that all of these are heightened given her morbid obesity.  She understands that our goals are hopefully decreased pain, improved mobility, and improved quality of life.  Procedure description: After informed consent was obtained and the appropriate right hip was marked, the patient was brought to the operating room and set up on the stretcher where spinal anesthesia was  obtained.  She was then laid in supine position on the stretcher and a Foley catheter was placed.  Traction boots were placed on both her feet and next she was placed supine on the Hana fracture table with a perineal post in place and both legs in inline skeletal traction devices but no traction applied.  Her right operative hip and pelvis were assessed radiographically.  The right hip was prepped and draped in DuraPrep and sterile drapes.  A timeout was called and she was identified as the correct patient the correct right hip.  An incision was then made just inferior and posterior to the ASIS and carried slightly obliquely down the leg.  Dissection was carried down to the tensor fascia lata muscle and the tensor fascia was then divided longitudinally to proceed with a direct anterior approach the hip.  Circumflex vessels were identified and cauterized.  The hip capsule was identified and opened up in L-type format.  Cobra retractors were then placed around the medial and lateral femoral neck and a femoral neck cut was made with an oscillating saw just proximal to the lesser trochanter and this cut was completed with an osteotome.  A corkscrew guide was placed in the femoral head and the femoral head was removed its entirety and there was some areas that had significant cartilage wear.  There was also degenerative tearing of the last several labrum.  Remnants of the acetabular labrum and other debris removed.  A bent Hohmann was placed over the medial acetabular rim and reaming was initiated from a size 43 reamer and stepwise increments going up to a size 49 reamer with all reamers placed under direct visualization and the  last reamer also placed under direct fluoroscopy in order to obtain the depth of reaming, the inclination and the anteversion.  The real DePuy sector GRIPTION acetabular opponent size 50 was then placed without difficulty followed by 32+4 polyethylene liner.  Attention was then turned to the  femur.  With the right leg externally rotated to 120 degrees, extended and adducted, a Mueller retractor was placed medially and a Hohmann tract around the greater trochanter.  The lateral joint capsule was released and a box cutting osteotome was used into the femoral canal.  Broaching was then initiated using the Actis broaching system from a size 0 going to a size 4.  With a size 4 in place we trialed a standard offset femoral neck based on anatomy and a 32+1 trial hip ball.  The right leg was brought over and up and with traction and internal rotation reduced in the pelvis.  We assessed it radiographically and clinically and we are pleased with stability as well as offset and leg length.  We dislocated the hip remove the trial components.  We then placed the real Actis femoral component with standard offset size 4 and the real 32+1 ceramic head ball and again reduces the acetabulum and we appreciated stability of the hip as well as radiographic assessment of the hip.  The soft tissue was then irrigated normal saline solution.  The joint capsule was closed with interrupted #1 Ethibond suture followed by normal and Vicryl close the tensor fascia.  0 Vicryl was used to close deep tissue and 2-0 Vicryl was used to close subcutaneous tissue.  The skin was closed with interrupted 2-0 nylon suture.  An Aquacel dressing was applied.  The patient was taken off of the Hana table and taken the recovery room.  Malena Scull, PA-C did assist during the entire case and beginning to end and his assistance was crucial and medically necessary for soft tissue management and retraction, helping guide implant placement and a layered closure of the wound.

## 2023-11-11 NOTE — Anesthesia Procedure Notes (Signed)
 Spinal  Patient location during procedure: OR Start time: 11/11/2023 9:06 AM End time: 11/11/2023 9:09 AM Reason for block: surgical anesthesia Staffing Performed: anesthesiologist  Anesthesiologist: Juventino Oppenheim, MD Performed by: Juventino Oppenheim, MD Authorized by: Juventino Oppenheim, MD   Preanesthetic Checklist Completed: patient identified, IV checked, risks and benefits discussed, surgical consent, monitors and equipment checked, pre-op evaluation and timeout performed Spinal Block Patient position: sitting Prep: DuraPrep Patient monitoring: heart rate, cardiac monitor, continuous pulse ox and blood pressure Approach: midline Location: L2-3 Injection technique: single-shot Needle Needle type: Pencan  Needle gauge: 24 G Additional Notes Consent was obtained prior to the procedure with all questions answered and concerns addressed. Risks including, but not limited to, bleeding, infection, nerve damage, paralysis, failed block, inadequate analgesia, allergic reaction, high spinal, itching, and headache were discussed and the patient wished to proceed. Functioning IV was confirmed and monitors were applied. Sterile prep and drape, including hand hygiene, mask, and sterile gloves were used. The patient was positioned and the spine was prepped. The skin was anesthetized with lidocaine . Free flow of clear CSF was obtained prior to injecting local anesthetic into the CSF. The spinal needle aspirated freely following injection. The needle was carefully withdrawn. The patient tolerated the procedure well.   Hobart Lulas, MD

## 2023-11-11 NOTE — Discharge Instructions (Signed)
 Per [facility] clinic policy, our goal is ensure optimal postoperative pain control with a multimodal pain management strategy. For all [facility] patients, our goal is to wean post-operative narcotic medications by 6 weeks post-operatively. If this is not possible due to utilization of pain medication prior to surgery, your [facility] doctor will support your acute post-operative pain control for the first 6 weeks postoperatively, with a plan to transition you back to your primary pain team following that. Danielle [name] will work to ensure a Therapist, occupational.  INSTRUCTIONS AFTER JOINT REPLACEMENT   Remove items at home which could result in a fall. This includes throw rugs or furniture in walking pathways ICE to the affected joint every three hours while awake for 30 minutes at a time, for at least the first 3-5 days, and then as needed for pain and swelling.  Continue to use ice for pain and swelling. You may notice swelling that will progress down to the foot and ankle.  This is normal after surgery.  Elevate your leg when you are not up walking on it.   Continue to use the breathing machine you got in the hospital (incentive spirometer) which will help keep your temperature down.  It is common for your temperature to cycle up and down following surgery, especially at night when you are not up moving around and exerting yourself.  The breathing machine keeps your lungs expanded and your temperature down.   DIET:  As you were doing prior to hospitalization, we recommend a well-balanced diet.  DRESSING / WOUND CARE / SHOWERING  Keep the surgical dressing until follow up.  The dressing is water proof, so you can shower without any extra covering.  IF THE DRESSING FALLS OFF or the wound gets wet inside, change the dressing with sterile gauze.  Please use good hand washing techniques before changing the dressing.  Do not use any lotions or creams on the incision until instructed by your surgeon.     ACTIVITY  Increase activity slowly as tolerated, but follow the weight bearing instructions below.   No driving for 6 weeks or until further direction given by your physician.  You cannot drive while taking narcotics.  No lifting or carrying greater than 10 lbs. until further directed by your surgeon. Avoid periods of inactivity such as sitting longer than an hour when not asleep. This helps prevent blood clots.  You may return to work once you are authorized by your doctor.     WEIGHT BEARING   Weight bearing as tolerated with assist device (walker, cane, etc) as directed, use it as long as suggested by your surgeon or therapist, typically at least 4-6 weeks.   EXERCISES  Results after joint replacement surgery are often greatly improved when you follow the exercise, range of motion and muscle strengthening exercises prescribed by your doctor. Safety measures are also important to protect the joint from further injury. Any time any of these exercises cause you to have increased pain or swelling, decrease what you are doing until you are comfortable again and then slowly increase them. If you have problems or questions, call your caregiver or physical therapist for advice.   Rehabilitation is important following a joint replacement. After just a few days of immobilization, the muscles of the leg can become weakened and shrink (atrophy).  These exercises are designed to build up the tone and strength of the thigh and leg muscles and to improve motion. Often times heat used for twenty to thirty minutes before  working out will loosen up your tissues and help with improving the range of motion but do not use heat for the first two weeks following surgery (sometimes heat can increase post-operative swelling).   These exercises can be done on a training (exercise) mat, on the floor, on a table or on a bed. Use whatever works the best and is most comfortable for you.    Use music or television  while you are exercising so that the exercises are a pleasant break in your day. This will make your life better with the exercises acting as a break in your routine that you can look forward to.   Perform all exercises about fifteen times, three times per day or as directed.  You should exercise both the operative leg and the other leg as well.  Exercises include:   Quad Sets - Tighten up the muscle on the front of the thigh (Quad) and hold for 5-10 seconds.   Straight Leg Raises - With your knee straight (if you were given a brace, keep it on), lift the leg to 60 degrees, hold for 3 seconds, and slowly lower the leg.  Perform this exercise against resistance later as your leg gets stronger.  Leg Slides: Lying on your back, slowly slide your foot toward your buttocks, bending your knee up off the floor (only go as far as is comfortable). Then slowly slide your foot back down until your leg is flat on the floor again.  Angel Wings: Lying on your back spread your legs to the side as far apart as you can without causing discomfort.  Hamstring Strength:  Lying on your back, push your heel against the floor with your leg straight by tightening up the muscles of your buttocks.  Repeat, but this time bend your knee to a comfortable angle, and push your heel against the floor.  You may put a pillow under the heel to make it more comfortable if necessary.   A rehabilitation program following joint replacement surgery can speed recovery and prevent re-injury in the future due to weakened muscles. Contact your doctor or a physical therapist for more information on knee rehabilitation.    CONSTIPATION  Constipation is defined medically as fewer than three stools per week and severe constipation as less than one stool per week.  Even if you have a regular bowel pattern at home, your normal regimen is likely to be disrupted due to multiple reasons following surgery.  Combination of anesthesia, postoperative  narcotics, change in appetite and fluid intake all can affect your bowels.   YOU MUST use at least one of the following options; they are listed in order of increasing strength to get the job done.  They are all available over the counter, and you may need to use some, POSSIBLY even all of these options:    Drink plenty of fluids (prune juice may be helpful) and high fiber foods Colace 100 mg by mouth twice a day  Senokot for constipation as directed and as needed Dulcolax (bisacodyl), take with full glass of water  Miralax (polyethylene glycol) once or twice a day as needed.  If you have tried all these things and are unable to have a bowel movement in the first 3-4 days after surgery call either your surgeon or your primary doctor.    If you experience loose stools or diarrhea, hold the medications until you stool forms back up.  If your symptoms do not get better within 1 week  or if they get worse, check with your doctor.  If you experience "the worst abdominal pain ever" or develop nausea or vomiting, please contact the office immediately for further recommendations for treatment.   ITCHING:  If you experience itching with your medications, try taking only a single pain pill, or even half a pain pill at a time.  You can also use Benadryl over the counter for itching or also to help with sleep.   TED HOSE STOCKINGS:  Use stockings on both legs until for at least 2 weeks or as directed by physician office. They may be removed at night for sleeping.  MEDICATIONS:  See your medication summary on the "After Visit Summary" that nursing will review with you.  You may have some home medications which will be placed on hold until you complete the course of blood thinner medication.  It is important for you to complete the blood thinner medication as prescribed.  PRECAUTIONS:  If you experience chest pain or shortness of breath - call 911 immediately for transfer to the hospital emergency department.    If you develop a fever greater that 101 F, purulent drainage from wound, increased redness or drainage from wound, foul odor from the wound/dressing, or calf pain - CONTACT YOUR SURGEON.                                                   FOLLOW-UP APPOINTMENTS:  If you do not already have a post-op appointment, please call the office for an appointment to be seen by your surgeon.  Guidelines for how soon to be seen are listed in your "After Visit Summary", but are typically between 1-4 weeks after surgery.  OTHER INSTRUCTIONS:   Knee Replacement:  Do not place pillow under knee, focus on keeping the knee straight while resting. CPM instructions: 0-90 degrees, 2 hours in the morning, 2 hours in the afternoon, and 2 hours in the evening. Place foam block, curve side up under heel at all times except when in CPM or when walking.  DO NOT modify, tear, cut, or change the foam block in any way.  POST-OPERATIVE OPIOID TAPER INSTRUCTIONS: It is important to wean off of your opioid medication as soon as possible. If you do not need pain medication after your surgery it is ok to stop day one. Opioids include: Codeine, Hydrocodone(Norco, Vicodin), Oxycodone(Percocet, oxycontin) and hydromorphone amongst others.  Long term and even short term use of opiods can cause: Increased pain response Dependence Constipation Depression Respiratory depression And more.  Withdrawal symptoms can include Flu like symptoms Nausea, vomiting And more Techniques to manage these symptoms Hydrate well Eat regular healthy meals Stay active Use relaxation techniques(deep breathing, meditating, yoga) Do Not substitute Alcohol to help with tapering If you have been on opioids for less than two weeks and do not have pain than it is ok to stop all together.  Plan to wean off of opioids This plan should start within one week post op of your joint replacement. Maintain the same interval or time between taking each dose  and first decrease the dose.  Cut the total daily intake of opioids by one tablet each day Next start to increase the time between doses. The last dose that should be eliminated is the evening dose.   MAKE SURE YOU:  Understand these instructions.  Get help right away if you are not doing well or get worse.    Thank you for letting us be a part of your medical care team.  It is a privilege we respect greatly.  We hope these instructions will help you stay on track for a fast and full recovery!      Dental Antibiotics:  In most cases prophylactic antibiotics for Dental procdeures after total joint surgery are not necessary.  Exceptions are as follows:  1. History of prior total joint infection  2. Severely immunocompromised (Organ Transplant, cancer chemotherapy, Rheumatoid biologic meds such as Humera)  3. Poorly controlled diabetes (A1C &gt; 8.0, blood glucose over 200)  If you have one of these conditions, contact your surgeon for an antibiotic prescription, prior to your dental procedure.

## 2023-11-11 NOTE — Evaluation (Signed)
 Physical Therapy Evaluation Patient Details Name: Danielle [name] MRN: [id number] DOB: 1961-03-04 Today's Date: 11/11/2023  History of Present Illness  Patient is a 63 y/o female admitted 11/11/23 with R hip OA now s/p THA.  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  Clinical Impression  Patient presents with decreased mobility due to pain, decreased strength and ROM R hip, decreased balance and decreased activity tolerance.  Previously mobilizing independently with cane at home, though issues with chronic back pain.  She was able to ambulate to the doorway though was limited by nausea and assisted to recliner.  Feel she will benefit from continued skilled PT in the acute setting and from post-acute follow up as per MD.        If plan is discharge home, recommend the following: A little help with walking and/or transfers;A little help with bathing/dressing/bathroom;Help with stairs or ramp for entrance;Assist for transportation;Assistance with cooking/housework   Can travel by private vehicle        Equipment Recommendations BSC/3in1  Recommendations for Other Services       Functional Status Assessment Patient has had a recent decline in their functional status and demonstrates the ability to make significant improvements in function in a reasonable and predictable amount of time.     Precautions / Restrictions Precautions Precautions: Fall Recall of Precautions/Restrictions: Intact Restrictions Weight Bearing Restrictions Per Provider Order: No      Mobility  Bed Mobility Overal bed mobility: Needs Assistance Bed Mobility: Supine to Sit     Supine to sit: HOB elevated, Min assist     General bed mobility comments: to initiate moving R LE and to assist R LE; some assist for scooting as well    Transfers Overall transfer level: Needs  assistance Equipment used: Rolling walker (2 wheels) Transfers: Sit to/from Stand Sit to Stand: Min assist, From elevated surface           General transfer comment: cues for technique, pt performing self selected technique though safe, assist for balance and due to pain    Ambulation/Gait Ambulation/Gait assistance: Contact guard assist Gait Distance (Feet): 12 Feet Assistive device: Rolling walker (2 wheels) Gait Pattern/deviations: Step-to pattern, Decreased stride length, Shuffle, Wide base of support, Antalgic       General Gait Details: slow, stopping to rest and eventual c/o nausea, chair brought for pt to sit, RN in room to help  Stairs            Wheelchair Mobility     Tilt Bed    Modified Rankin (Stroke Patients Only)       Balance Overall balance assessment: Needs assistance   Sitting balance-Leahy Scale: Fair     Standing balance support: Bilateral upper extremity supported, Reliant on assistive device for balance Standing balance-Leahy Scale: Poor                               Pertinent Vitals/Pain Pain Assessment Pain Assessment: Faces Faces Pain Scale: Hurts whole lot Pain Location: low back on R side and hip at incision Pain Descriptors / Indicators: Burning, Crushing Pain Intervention(s): Monitored during session, Limited activity within patient's tolerance, Repositioned, Premedicated before session, Ice applied    Home Living Family/patient expects to be discharged to:: Private residence Living Arrangements: Spouse/significant other Available Help at Discharge: Family Type of Home: House  Home Access: Stairs to enter Entrance Stairs-Rails: None Entrance Stairs-Number of Steps: 2   Home Layout: One level Home Equipment: Agricultural consultant (2 wheels);Shower seat;Cane - single point      Prior Function Prior Level of Function : Independent/Modified Independent             Mobility Comments: walking with cane PTA        Extremity/Trunk Assessment   Upper Extremity Assessment Upper Extremity Assessment: Generalized weakness    Lower Extremity Assessment Lower Extremity Assessment: RLE deficits/detail RLE Deficits / Details: pain with attempts at hip flexion though pt sitting up with HOB at max height, pain with movement at lateral thigh though able to perform quad set and moves ankle well    Cervical / Trunk Assessment Cervical / Trunk Assessment: Lordotic  Communication   Communication Communication: No apparent difficulties    Cognition Arousal: Lethargic Behavior During Therapy: Flat affect   PT - Cognitive impairments: Attention                       PT - Cognition Comments: limited attention due to lethargy and pain focus Following commands: Impaired Following commands impaired: Only follows one step commands consistently, Follows one step commands with increased time (and repeated cues)     Cueing Cueing Techniques: Verbal cues     General Comments General comments (skin integrity, edema, etc.): R hip with long dressing and some swelling ice applied; SpO2 91% initially, pt coughing and producing phlegm and noted SpO2 up to 99% on RA throughout; pt also wheezing and used her Albuterol  inhaler. Spouse and daughter present initially    Exercises     Assessment/Plan    PT Assessment Patient needs continued PT services  PT Problem List Decreased strength;Decreased range of motion;Decreased activity tolerance;Decreased balance;Pain;Decreased knowledge of use of DME;Decreased mobility       PT Treatment Interventions DME instruction;Therapeutic exercise;Gait training;Balance training;Stair training;Functional mobility training;Therapeutic activities;Patient/family education    PT Goals (Current goals can be found in the Care Plan section)  Acute Rehab PT Goals Patient Stated Goal: return home PT Goal Formulation: With patient/family Time For Goal Achievement:  11/18/23 Potential to Achieve Goals: Good    Frequency 7X/week     Co-evaluation               AM-PAC PT "6 Clicks" Mobility  Outcome Measure Help needed turning from your back to your side while in a flat bed without using bedrails?: A Little Help needed moving from lying on your back to sitting on the side of a flat bed without using bedrails?: A Little Help needed moving to and from a bed to a chair (including a wheelchair)?: A Little Help needed standing up from a chair using your arms (e.g., wheelchair or bedside chair)?: A Little Help needed to walk in hospital room?: Total Help needed climbing 3-5 steps with a railing? : Total 6 Click Score: 14    End of Session Equipment Utilized During Treatment: Gait belt Activity Tolerance: Patient limited by pain Patient left: in chair;with call bell/phone within reach;with nursing/sitter in room   PT Visit Diagnosis: Difficulty in walking, not elsewhere classified (R26.2);Pain Pain - Right/Left: Right Pain - part of body: Hip    Time: 1635-1710 PT Time Calculation (min) (ACUTE ONLY): 35 min   Charges:   PT Evaluation $PT Eval Moderate Complexity: 1 Mod PT Treatments $Gait Training: 8-22 mins PT General Charges $$ ACUTE PT VISIT: 1 Visit  Abigail Hoff, PT Acute Rehabilitation Services Office:[phone] 11/11/2023   Marley Simmers 11/11/2023, 6:01 PM

## 2023-11-11 NOTE — Interval H&P Note (Signed)
 History and Physical Interval Note: The patient understands that she is here today for a right total hip replacement to treat her significant right hip pain and arthritis.  There has been no acute or interval change in her medical status.  The risks and benefits of surgery have been discussed in detail and informed consent has been obtained.  The right operative hip has been marked.  11/11/2023 7:09 AM  Danielle [name]  has presented today for surgery, with the diagnosis of osteoarthritis right hip.  The various methods of treatment have been discussed with the patient and family. After consideration of risks, benefits and other options for treatment, the patient has consented to  Procedure(s): ARTHROPLASTY, HIP, TOTAL, ANTERIOR APPROACH (Right) as a surgical intervention.  The patient's history has been reviewed, patient examined, no change in status, stable for surgery.  I have reviewed the patient's chart and labs.  Questions were answered to the patient's satisfaction.     Arnie Lao

## 2023-11-12 ENCOUNTER — Other Ambulatory Visit (HOSPITAL_COMMUNITY): Payer: Self-pay

## 2023-11-12 ENCOUNTER — Encounter (HOSPITAL_COMMUNITY): Payer: Self-pay | Admitting: Orthopaedic Surgery

## 2023-11-12 DIAGNOSIS — M1611 Unilateral primary osteoarthritis, right hip: Secondary | ICD-10-CM | POA: Diagnosis not present

## 2023-11-12 LAB — BASIC METABOLIC PANEL WITH GFR
Anion gap: 11 (ref 5–15)
BUN: 18 mg/dL (ref 8–23)
CO2: 20 mmol/L — ABNORMAL LOW (ref 22–32)
Calcium: 8.3 mg/dL — ABNORMAL LOW (ref 8.9–10.3)
Chloride: 100 mmol/L (ref 98–111)
Creatinine, Ser: 1.73 mg/dL — ABNORMAL HIGH (ref 0.44–1.00)
GFR, Estimated: 33 mL/min — ABNORMAL LOW (ref >60)
Glucose, Bld: 117 mg/dL — ABNORMAL HIGH (ref 70–99)
Potassium: 4.5 mmol/L (ref 3.5–5.1)
Sodium: 131 mmol/L — ABNORMAL LOW (ref 135–145)

## 2023-11-12 LAB — CBC
HCT: 34.1 % — ABNORMAL LOW (ref 36.0–46.0)
Hemoglobin: 11 g/dL — ABNORMAL LOW (ref 12.0–15.0)
MCH: 31.4 pg (ref 26.0–34.0)
MCHC: 32.3 g/dL (ref 30.0–36.0)
MCV: 97.4 fL (ref 80.0–100.0)
Platelets: 276 10*3/uL (ref 150–400)
RBC: 3.5 MIL/uL — ABNORMAL LOW (ref 3.87–5.11)
RDW: 13.5 % (ref 11.5–15.5)
WBC: 17 10*3/uL — ABNORMAL HIGH (ref 4.0–10.5)
nRBC: 0 % (ref 0.0–0.2)

## 2023-11-12 MED ORDER — CYCLOBENZAPRINE HCL 10 MG PO TABS
10.0000 mg | ORAL_TABLET | Freq: Three times a day (TID) | ORAL | 1 refills | Status: DC | PRN
  Filled 2023-11-12: qty 30, 10d supply, fill #0

## 2023-11-12 MED ORDER — ASPIRIN 81 MG PO CHEW
81.0000 mg | CHEWABLE_TABLET | Freq: Two times a day (BID) | ORAL | 0 refills | Status: DC
  Filled 2023-11-12: qty 30, 15d supply, fill #0

## 2023-11-12 MED ORDER — OXYCODONE HCL 5 MG PO TABS
5.0000 mg | ORAL_TABLET | Freq: Four times a day (QID) | ORAL | 0 refills | Status: DC | PRN
Start: 2023-11-12 — End: 2023-11-24
  Filled 2023-11-12: qty 30, 4d supply, fill #0

## 2023-11-12 NOTE — Progress Notes (Signed)
 Subjective: 1 Day Post-Op Procedure(s) (LRB): RIGHT TOTAL HIP ARTHROPLASTY, ANTERIOR APPROACH (Right) Patient reports pain as moderate.    Objective: Vital signs in last 24 hours: Temp:  [97.7 F (36.5 C)-98.6 F (37 C)] 98 F (36.7 C) (04/30 0425) Pulse Rate:  [98-114] 98 (04/30 0425) Resp:  [11-27] 18 (04/30 0425) BP: (92-126)/(45-97) 114/83 (04/30 0425) SpO2:  [87 %-97 %] 91 % (04/30 0425)  Intake/Output from previous day: 04/29 0701 - 04/30 0700 In: 1913.3 [I.V.:1100; IV Piggyback:813.3] Out: 550 [Urine:350; Blood:200] Intake/Output this shift: No intake/output data recorded.  No results for input(s): "HGB" in the last 72 hours. No results for input(s): "WBC", "RBC", "HCT", "PLT" in the last 72 hours. No results for input(s): "NA", "K", "CL", "CO2", "BUN", "CREATININE", "GLUCOSE", "CALCIUM" in the last 72 hours. No results for input(s): "LABPT", "INR" in the last 72 hours.  Sensation intact distally Intact pulses distally Dorsiflexion/Plantar flexion intact Incision: dressing C/D/I   Assessment/Plan: 1 Day Post-Op Procedure(s) (LRB): RIGHT TOTAL HIP ARTHROPLASTY, ANTERIOR APPROACH (Right) Up with therapy Discharge home with home health      Arnie Lao 11/12/2023, 7:46 AM

## 2023-11-12 NOTE — Plan of Care (Signed)
 Pt and family given D/C instructions with verbal understanding. Rx's were delivered to the room by [facility] pharmacy. Pt's incision is clean and dry with no sign of infection. Pt's IV was removed prior to D/C. Pt D/C'd home via wheelchair per MD order. Pt received RW and 3-n-1 from [employer] per MD order. Pt is stable @ D/C and has no other needs at this time. Barron Lien, RN

## 2023-11-12 NOTE — Progress Notes (Signed)
 CM is unable to get a home health agency to accept for [facility] services. Pt is aware.

## 2023-11-12 NOTE — Discharge Summary (Signed)
 Patient ID: Danielle [name] MRN: [id number] DOB/AGE: [birth year] 63 y.o.  Admit date: 11/11/2023 Discharge date: 11/12/2023  Admission Diagnoses:  Principal Problem:   Unilateral primary osteoarthritis, right hip Active Problems:   OA (osteoarthritis) of hip   Status post total replacement of right hip   Discharge Diagnoses:  Same  Past Medical History:  Diagnosis Date   Acid reflux    Anxiety    Arthritis    Asthma    Bell's palsy    Bursitis    Complication of anesthesia    "bladder didn't wake up for a few days after one surgery"   COPD (chronic obstructive pulmonary disease) (HCC)    Depression    Fibromyalgia    Heart murmur    pt previously evaluated by anesthesia (heart murmur as a child)   Hepatitis C    Hiatal hernia    Hyperlipidemia    Hypertension    Hypothyroidism    IBS (irritable bowel syndrome)    Insomnia    Osteoarthritis    Post-nasal drip     Surgeries: Procedure(s): RIGHT TOTAL HIP ARTHROPLASTY, ANTERIOR APPROACH on 11/11/2023   Consultants:   Discharged Condition: Improved  Hospital Course: Danielle [name] is an 63 y.o. female who was admitted 11/11/2023 for operative treatment ofUnilateral primary osteoarthritis, right hip. Patient has severe unremitting pain that affects sleep, daily activities, and work/hobbies. After pre-op clearance the patient was taken to the operating room on 11/11/2023 and underwent  Procedure(s): RIGHT TOTAL HIP ARTHROPLASTY, ANTERIOR APPROACH.    Patient was given perioperative antibiotics:  Anti-infectives (From admission, onward)    Start     Dose/Rate Route Frequency Ordered Stop   11/11/23 1400  ceFAZolin (ANCEF) IVPB 2g/100 mL premix        2 g 200 mL/hr over 30 Minutes Intravenous Every 6 hours 11/11/23 1314 11/11/23 2044   11/11/23 0730  gentamicin (GARAMYCIN) 530 mg in dextrose 5 % 100 mL IVPB        5 mg/kg  106.1 kg 113.3 mL/hr over 60 Minutes Intravenous On call to O.R. 11/11/23 0709 11/11/23 0920    11/11/23 0715  vancomycin (VANCOREADY) IVPB 1500 mg/300 mL        1,500 mg 150 mL/hr over 120 Minutes Intravenous On call to O.R. 11/11/23 4098 11/11/23 0942        Patient was given sequential compression devices, early ambulation, and chemoprophylaxis to prevent DVT.  Patient benefited maximally from hospital stay and there were no complications.    Recent vital signs: Patient Vitals for the past 24 hrs:  BP Temp Temp src Pulse Resp SpO2  11/12/23 0816 106/67 99.3 F (37.4 C) Oral (!) 103 18 95 %  11/12/23 0425 114/83 98 F (36.7 C) Oral 98 18 91 %  11/12/23 0014 (!) 107/58 98.6 F (37 C) Oral 99 20 94 %  11/11/23 2012 99/78 97.7 F (36.5 C) Oral (!) 107 18 97 %  11/11/23 1725 106/65 97.9 F (36.6 C) -- 98 18 96 %  11/11/23 1321 126/77 -- -- (!) 108 18 96 %  11/11/23 1259 -- 98 F (36.7 C) -- -- -- 94 %  11/11/23 1257 -- -- -- (!) 114 (!) 27 95 %  11/11/23 1245 (!) 105/45 -- -- (!) 112 12 96 %  11/11/23 1232 113/64 -- -- (!) 114 (!) 24 90 %  11/11/23 1215 107/66 -- -- (!) 106 11 94 %  11/11/23 1203 100/71 -- -- (!) 107 -- 94 %  11/11/23 1200 92/74 -- -- (!) 102 -- 94 %  11/11/23 1145 97/75 -- -- (!) 108 14 93 %  11/11/23 1130 114/67 -- -- (!) 108 (!) 27 95 %  11/11/23 1120 -- -- -- (!) 105 12 95 %  11/11/23 1115 (!) 125/97 -- -- (!) 107 20 (!) 87 %     Recent laboratory studies:  Recent Labs    11/12/23 0718  WBC 17.0*  HGB 11.0*  HCT 34.1*  PLT 276  NA 131*  K 4.5  CL 100  CO2 20*  BUN 18  CREATININE 1.73*  GLUCOSE 117*  CALCIUM 8.3*     Discharge Medications:   Allergies as of 11/12/2023       Reactions   Aripiprazole Swelling   Duloxetine Swelling   Clogged her up - constipation, feet swelling.   Gabapentin Swelling   Feet swell.   Gold Hives, Itching   Itching 45 minutes after taking it & then broke out in hives. Was put on Prednisone . Treated for 5 months.   Iodinated Contrast Media Anaphylaxis   Anaphylactic reaction first encounter with  myelogram. Had myelogram, given IV dye, had no heart beat, no pulse.   Naproxen Sodium Other (See Comments)   GI bleeding.   Nortriptyline Hcl Swelling   Made her hoarse, throat swelling when she took 100 mg.   Nsaids Other (See Comments)   GI bleed.   Pregabalin Swelling   Feet swelling.   Acetaminophen  Nausea And Vomiting   Meperidine Hives   Meperidine is the generic name for Demerol   Ace Inhibitors    Other reaction(s): Other (See Comments) Unknown   Aspirin Other (See Comments)   Bothers her stomach.   Cefpodoxime    Other reaction(s): Other (See Comments) Bad thrush.   Fluoxetine Diarrhea   Constant runs.   Fluticasone -salmeterol Other (See Comments)   Blisters in mouth Advair   Levofloxacin Other (See Comments)   Unknown, probably stomach issue.   Lithium Other (See Comments)   Made her stare at the walls & cry.   Paroxetine    Other reaction(s): Other (See Comments) Unknown Other reaction(s): Unknown   Tape Other (See Comments)   Skin peels off   Telithromycin Other (See Comments)   Blisters in mouth plus thrush.   Theophylline Other (See Comments)   Unknown   Topiramate Other (See Comments)   Vomiting and increased depression   Trazodone Other (See Comments)   Blisters lips very bad.   Triamcinolone  Other (See Comments)   Mouth blistered down her throat even if she rinsed her mouth.   Venlafaxine Diarrhea   Took for years & did fine. Dropped electrolytes because it caused a lot of diarrhea.   Bupropion Rash   Ketorolac  Tromethamine  Nausea And Vomiting   Other reaction(s): Other (See Comments) Puking & headache   Morphine Nausea And Vomiting   Intermittent nausea & vomiting with pills, but could tolerate the liquid form.   Moxifloxacin Nausea And Vomiting   Bad thrush.        Medication List     TAKE these medications    albuterol  108 (90 Base) MCG/ACT inhaler Commonly known as: VENTOLIN  HFA Inhale 2 puffs into the lungs every 4 (four) hours  as needed for wheezing or shortness of breath.   Aspirin Low Dose 81 MG chewable tablet Generic drug: aspirin Chew 1 tablet (81 mg total) by mouth 2 (two) times daily.   BENGAY EX Apply 1 Application topically daily  as needed (pain).   cetirizine 10 MG tablet Commonly known as: ZYRTEC Take 10 mg by mouth at bedtime.   citalopram 40 MG tablet Commonly known as: CELEXA Take 40 mg by mouth at bedtime.   cyclobenzaprine  10 MG tablet Commonly known as: FLEXERIL  Take 1 tablet (10 mg total) by mouth 3 (three) times daily as needed for muscle spasms. What changed:  when to take this reasons to take this   dextromethorphan-guaiFENesin  30-600 MG 12hr tablet Commonly known as: MUCINEX  DM Take 1 tablet by mouth 2 (two) times daily.   docusate sodium 100 MG capsule Commonly known as: COLACE Take 100 mg by mouth daily as needed for moderate constipation.   fluticasone  50 MCG/ACT nasal spray Commonly known as: FLONASE Place 1 spray into both nostrils daily.   ipratropium-albuterol  0.5-2.5 (3) MG/3ML Soln Commonly known as: DUONEB Take 3 mLs by nebulization every 6 (six) hours as needed.   levothyroxine 50 MCG tablet Commonly known as: SYNTHROID Take 50 mcg by mouth daily before breakfast.   losartan 25 MG tablet Commonly known as: COZAAR Take 25 mg by mouth daily.   oxyCODONE  5 MG immediate release tablet Commonly known as: Oxy IR/ROXICODONE  Take 1-2 tablets (5-10 mg total) by mouth every 6 (six) hours as needed for moderate pain (pain score 4-6) (pain score 4-6).   Spiriva HandiHaler 18 MCG inhalation capsule Generic drug: tiotropium Place 18 mcg into inhaler and inhale in the morning.   Symbicort 160-4.5 MCG/ACT inhaler Generic drug: budesonide-formoterol Inhale 2 puffs into the lungs daily.   Vitamin D3 50 MCG (2000 UT) capsule Take 2,000 Units by mouth at bedtime.               Durable Medical Equipment  (From admission, onward)           Start      Ordered   11/11/23 1315  DME 3 n 1  Once        11/11/23 1314   11/11/23 1315  DME Walker rolling  Once       Question Answer Comment  Walker: With 5 Inch Wheels   Patient needs a walker to treat with the following condition Status post total replacement of right hip      11/11/23 1314            Diagnostic Studies: DG Pelvis Portable Result Date: 11/11/2023 CLINICAL DATA:  Status post right hip replacement. EXAM: PORTABLE PELVIS 1-2 VIEWS COMPARISON:  None Available. FINDINGS: Right hip arthroplasty in expected alignment. No periprosthetic lucency or fracture. Recent postsurgical change includes air and edema in the soft tissues. IMPRESSION: Right hip arthroplasty without immediate postoperative complication. Electronically Signed   By: Chadwick Colonel M.D.   On: 11/11/2023 11:34   DG HIP UNILAT WITH PELVIS 1V RIGHT Result Date: 11/11/2023 CLINICAL DATA:  Elective surgery. EXAM: DG HIP (WITH OR WITHOUT PELVIS) 1V RIGHT COMPARISON:  None Available. FINDINGS: Three fluoroscopic spot views of the pelvis and right hip obtained in the operating room. Images during hip arthroplasty. Fluoroscopy time 28 seconds. Dose 4.63 mGy. IMPRESSION: Intraoperative fluoroscopy during right hip arthroplasty. Electronically Signed   By: Chadwick Colonel M.D.   On: 11/11/2023 10:46   DG C-Arm 1-60 Min-No Report Result Date: 11/11/2023 Fluoroscopy was utilized by the requesting physician.  No radiographic interpretation.    Disposition: Discharge disposition: 01-Home or Self Care          Follow-up Information     Arnie Lao, MD Follow  up in 2 week(s).   Specialty: Orthopedic Surgery Contact information: [address] [place]  [place] [address] [phone]                  Signed: Arnie Lao 11/12/2023, 11:06 AM  \

## 2023-11-12 NOTE — Progress Notes (Signed)
 Physical Therapy Treatment Patient Details Name: Danielle [name] MRN: [id number] DOB: [birth year] Today's Date: 11/12/2023   History of Present Illness Patient is a 63 y/o female admitted 11/11/23 with R hip OA now s/p THA.  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    PT Comments  Patient progressing with mobility and able to negotiate stairs appropriate for home entry.  Handout given for instructions for stairs for family to assist.  Daughter in the room though asleep throughout.  Patient using strap to help R leg in and out of bed though may still need help for positioning and sit to supine.  Appropriate for d/c with family support and follow up as set up per MD.  PT will sign off as for d/c home.     If plan is discharge home, recommend the following: A little help with walking and/or transfers;A little help with bathing/dressing/bathroom;Help with stairs or ramp for entrance;Assist for transportation;Assistance with cooking/housework   Can travel by private vehicle        Equipment Recommendations  BSC/3in1;Rolling walker (2 wheels)    Recommendations for Other Services       Precautions / Restrictions Precautions Precautions: Fall Recall of Precautions/Restrictions: Intact     Mobility  Bed Mobility Overal bed mobility: Needs Assistance Bed Mobility: Supine to Sit, Sit to Supine     Supine to sit: Used rails, Supervision Sit to supine: Min assist   General bed mobility comments: used gait belt to help R LE out of bed, some help for R leg into bed despite using strap to supine, cues for positioning up in bed    Transfers Overall transfer level: Needs assistance Equipment used: Rolling walker (2 wheels) Transfers: Sit to/from Stand Sit to Stand: Contact guard assist           General transfer comment: for balance, safety pt pulled up on RW to  stand from wheelchair, using hand on bed for up from EOB    Ambulation/Gait Ambulation/Gait assistance: Contact guard assist, Supervision Gait Distance (Feet): 100 Feet Assistive device: Rolling walker (2 wheels) Gait Pattern/deviations: Step-to pattern, Step-through pattern, Trunk flexed, Antalgic, Decreased stance time - right       General Gait Details: stopping at times to rub R thigh, mild SOB SpO2 98%, HR 120   Stairs Stairs: Yes Stairs assistance: Min assist Stair Management: Step to pattern, With walker, Backwards Number of Stairs: 2 General stair comments: demonstrated technique, assist for walker stability stepping up and down with cues for sequence, handout given and reviewed   Wheelchair Mobility     Tilt Bed    Modified Rankin (Stroke Patients Only)       Balance Overall balance assessment: Needs assistance Sitting-balance support: Feet supported Sitting balance-Leahy Scale: Good     Standing balance support: Reliant on assistive device for balance, Bilateral upper extremity supported Standing balance-Leahy Scale: Poor                              Communication Communication Communication: No apparent difficulties  Cognition Arousal: Alert Behavior During Therapy: WFL for tasks assessed/performed   PT - Cognitive impairments: No apparent impairments                         Following commands: Intact  Cueing Cueing Techniques: Verbal cues  Exercises Total Joint Exercises Ankle Circles/Pumps: AROM, Both, 10 reps, Supine Quad Sets: AROM, Both, Supine, 5 reps Heel Slides: AROM, AAROM, Both, 5 reps, Supine (used strap to help with exercises on R) Hip ABduction/ADduction: AROM, AAROM, Both, Supine    General Comments General comments (skin integrity, edema, etc.): Used wheelchair for remainder of distance to stairs and to return from stairs, able to simulate car transfer with wheelchair (transport) access and A for R leg  in and out of chair/car; toileted in bathroom unaided      Pertinent Vitals/Pain Pain Assessment Faces Pain Scale: Hurts whole lot Pain Location: low back on R side and hip at incision Pain Descriptors / Indicators: Burning, Crushing Pain Intervention(s): Monitored during session, Ice applied    Home Living                          Prior Function            PT Goals (current goals can now be found in the care plan section) Progress towards PT goals: Progressing toward goals    Frequency    7X/week      PT Plan      Co-evaluation              AM-PAC PT "6 Clicks" Mobility   Outcome Measure  Help needed turning from your back to your side while in a flat bed without using bedrails?: A Little Help needed moving from lying on your back to sitting on the side of a flat bed without using bedrails?: A Little Help needed moving to and from a bed to a chair (including a wheelchair)?: A Little Help needed standing up from a chair using your arms (e.g., wheelchair or bedside chair)?: A Little Help needed to walk in hospital room?: A Little Help needed climbing 3-5 steps with a railing? : A Little 6 Click Score: 18    End of Session Equipment Utilized During Treatment: Gait belt Activity Tolerance: Patient tolerated treatment well Patient left: in bed;with call bell/phone within reach;with family/visitor present Nurse Communication: Other (comment) (equipment for d/c) PT Visit Diagnosis: Difficulty in walking, not elsewhere classified (R26.2);Pain Pain - Right/Left: Right Pain - part of body: Hip     Time: 8295-6213 PT Time Calculation (min) (ACUTE ONLY): 34 min  Charges:    $Gait Training: 8-22 mins $Therapeutic Activity: 8-22 mins PT General Charges $$ ACUTE PT VISIT: 1 Visit                     Abigail Hoff, PT Acute Rehabilitation Services Office:[phone] 11/12/2023    Marley Simmers 11/12/2023, 10:48 AM

## 2023-11-24 ENCOUNTER — Encounter: Payer: Self-pay | Admitting: Orthopaedic Surgery

## 2023-11-24 ENCOUNTER — Ambulatory Visit (INDEPENDENT_AMBULATORY_CARE_PROVIDER_SITE_OTHER): Admitting: Orthopaedic Surgery

## 2023-11-24 DIAGNOSIS — Z96641 Presence of right artificial hip joint: Secondary | ICD-10-CM

## 2023-11-24 MED ORDER — OXYCODONE HCL 5 MG PO TABS
5.0000 mg | ORAL_TABLET | Freq: Three times a day (TID) | ORAL | 0 refills | Status: DC | PRN
Start: 2023-11-24 — End: 2024-03-01

## 2023-11-24 NOTE — Progress Notes (Signed)
 The patient is here for first postoperative visit status post a right total hip replacement to treat significant right hip arthritis.  She reports improving range of motion and strength.  She been compliant with a baby aspirin  twice a day and is transition from a walker to a cane.  Her right hip incision looks okay.  Staples are removed and Steri-Strips applied.  Her calf is soft.  There is no significant swelling in her feet or ankles.  She will slowly increase her activities as comfort allows.  I will send in 1 more prescription of oxycodone  but she needs to try to back off of this type of medication.  Will see her back in a month to see how she is doing overall but no x-rays are needed.

## 2023-12-24 ENCOUNTER — Ambulatory Visit (INDEPENDENT_AMBULATORY_CARE_PROVIDER_SITE_OTHER): Admitting: Orthopaedic Surgery

## 2023-12-24 ENCOUNTER — Encounter: Payer: Self-pay | Admitting: Orthopaedic Surgery

## 2023-12-24 DIAGNOSIS — Z96641 Presence of right artificial hip joint: Secondary | ICD-10-CM

## 2023-12-24 NOTE — Progress Notes (Signed)
 The patient is now 6 weeks status post a right total hip arthroplasty.  She does report increased range of motion and strength but still having some pain to be expected.  This is affected her back as well and she does see her pain specialist soon for that.  On exam there is a small area at her groin crease with a small wound in the mid area but there is no evidence of infection and she is treating this with mupirocin ointment daily.  Her hip otherwise is moving smoothly and she is walking without any assistive device.  This point we will see her back in a month to see how she is doing overall.  No x-rays are needed.  I wonder really just make sure the incision continues to heal overall of the right hip.

## 2024-01-04 NOTE — Progress Notes (Unsigned)
 PROVIDER NOTE: Interpretation of information contained herein should be left to medically-trained personnel. Specific patient instructions are provided elsewhere under Patient Instructions section of medical record. This document was created in part using AI and STT-dictation technology, any transcriptional errors that may result from this process are unintentional.  Patient: Danielle [name]  Service: E/M   PCP: Danielle Bradley, MD  DOB: 1960/10/04  DOS: 01/05/2024  Provider: Eric DELENA Como, MD  MRN: [id number]  Delivery: Face-to-face  Specialty: Interventional Pain Management  Type: Established Patient  Setting: Ambulatory outpatient facility  Specialty designation: 09  Referring Prov.: Danielle Bradley, MD  Location: Outpatient office facility       History of present illness (HPI) [name], a 63 y.o. year old female, is here today because of her Chronic pain of both hips [M25.551, M25.552, G89.29]. Danielle [name] primary complain today is No chief complaint on file.  Pertinent problems: Danielle [name] has Cervical spinal cord compression [facility]) (April, 2017); Cervical spinal stenosis; Chronic shoulder pain (1ry area of Pain) (Bilateral) (R>L); Chronic low back pain (3ry area of Pain) (Bilateral) (R>L) w/ sciatica (Right); Lumbar facet syndrome (Bilateral) (R>L); Lumbar spondylosis; Failed back surgical syndrome; Chronic lower extremity pain (Right); Chronic neck pain (Bilateral) (R>L); Chronic cervical radicular pain (Right); Ulnar neuropathy (Left); Hx of cervical spine surgery; Cervical spondylosis; Fibromyalgia; Chronic sacroiliac pain (Bilateral) (L>R); History of total arthroplasty of shoulder (Left); Chronic pain syndrome; Complaints of weakness of lower extremity; Anterolisthesis of lumbar spine (L4/L5); Cervical facet syndrome (HCC); Chronic musculoskeletal pain; Muscle spasm of back; Myofascial pain; Chronic hip pain (2ry area of Pain) (Bilateral) (R>L); 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); Trigger point with back pain; Osteoarthritis of shoulder (Bilateral); Spondylosis without myelopathy or radiculopathy, lumbosacral region; Cellulitis of leg, right; Chronic trochanteric bursitis (Bilateral); Osteoarthritis involving multiple joints; Chronic shoulder pain after replacement; History of total shoulder replacement (Right); History of total replacement of shoulder joints (Bilateral); Leg edema, left; Spondylosis without myelopathy or radiculopathy, cervical region; Chronic hip pain (Right); Unilateral primary osteoarthritis, right hip; Chronic sacroiliac joint pain (Left); Other spondylosis, sacral and sacrococcygeal region; Sacroiliac joint dysfunction (Left); Somatic dysfunction of sacroiliac joint (Left); Enthesopathy of sacroiliac joint (Left); DDD (degenerative disc disease), cervical; DDD (degenerative disc disease), lumbosacral; Other intervertebral disc degeneration, lumbar region; Acetabular labrum tear, sequela (Left); Acetabular labrum tear, sequela (Right); Greater trochanteric bursitis of hips (Bilateral); Epidural fibrosis (Left: L5-S1); Chronic groin pain (Bilateral); Abnormal MRI, lumbar spine (09/28/2020); Abnormal MRI, hip (Bilateral) (10/04/2020); Chronic shoulder pain (Left); Cervicalgia; Chronic neck pain with history of cervical spinal surgery; Chronic hip pain (Left); Chronic groin pain (Left); Abnormal drug screen (11/21/2021); Chronic sacroiliac joint pain (Right); Chronic pain disorder; Arthralgia of hip; Trochanteric bursitis of hip (Right); Left thigh pain; Right buttock pain; Somatic dysfunction of sacroiliac joint (Right); Sacroiliac joint dysfunction (Right); Chronic low back pain (Bilateral) (R>L) w/o sciatica; Lumbar facet joint pain; Osteoarthritis of facet joint of lumbar spine; and Abnormal CT scan, cervical spine (07/18/2021) on their pertinent problem list.  Pain Assessment: Severity of   is reported as a  /10. Location:    / . Onset:  . Quality:  . Timing:   . Modifying factor(s):  SABRA Vitals:  vitals were not taken for this visit.  BMI: Estimated body mass index is 38.94 kg/m as calculated from the following:   Height as of 11/11/23: 5' 5 (1.651 m).   Weight as of 11/11/23: 234 lb (106.1 kg).  Last encounter: 04/17/2023. Last procedure: 03/11/2023.  Reason  for encounter: evaluation of worsening, or previously known (established) problem.   Discussed the use of AI scribe software for clinical note transcription with the patient, who gave verbal consent to proceed.  History of Present Illness           Pharmacotherapy Assessment   Analgesic: No chronic opioid analgesics therapy prescribed by our practice.  Discontinued (11/21/2021) UDS. (+) COCAINE.  Monitoring: [place] PMP: PDMP reviewed during this encounter.       Pharmacotherapy: No side-effects or adverse reactions reported. Compliance: No problems identified. Effectiveness: Clinically acceptable.  No notes on file  UDS:  Summary  Date Value Ref Range Status  11/21/2021 Note  Final    Comment:    ==================================================================== [employer] Select 13 (MW) ==================================================================== Test                             Result       Flag       Units  Drug Present and Declared for Prescription Verification   Oxycodone                       >2433        EXPECTED   ng/mg creat   Oxymorphone                    164          EXPECTED   ng/mg creat   Noroxycodone                   >2433        EXPECTED   ng/mg creat   Noroxymorphone                 58           EXPECTED   ng/mg creat    Sources of oxycodone  are scheduled prescription medications.    Oxymorphone, noroxycodone, and noroxymorphone are expected    metabolites of oxycodone . Oxymorphone is also available as a    scheduled prescription medication.  Drug Present not Declared for Prescription Verification   Methamphetamine                12            UNEXPECTED ng/mg creat    Sources of methamphetamine include illicit sources, as a scheduled    prescription medication, as a metabolite of some prescription drugs,    or use of an l-methamphetamine inhaler.    Oxazepam                       17           UNEXPECTED ng/mg creat    Oxazepam may be administered as a scheduled prescription medication;    it is also an expected metabolite of other benzodiazepine drugs,    including diazepam , chlordiazepoxide, prazepam, clorazepate,    halazepam, and temazepam.    Benzoylecgonine                38           UNEXPECTED ng/mg creat    Benzoylecgonine is a metabolite of cocaine; its presence indicates    use of this drug.  Source is most commonly illicit, but cocaine is    present in some topical anesthetic solutions.  ==================================================================== Test  Result    Flag   Units      Ref Range   Creatinine              411              mg/dL      >=79 ==================================================================== Declared Medications:  The flagging and interpretation on this report are based on the  following declared medications.  Unexpected results may arise from  inaccuracies in the declared medications.   **Note: The testing scope of this panel includes these medications:   Oxycodone    **Note: The testing scope of this panel does not include the  following reported medications:   Albuterol   Azelastine  Cetirizine  Citalopram  (Celexa )  Diclofenac  (Voltaren )  Docusate (Colace)  Fluticasone  (Trelegy)  Guaifenesin  (Mucinex )  Levothyroxine  (Synthroid )  Naloxone   Nystatin  Supplement  Tizanidine  (Zanaflex )  Umeclidinium (Trelegy)  Vilanterol (Trelegy)  Vitamin D  ==================================================================== For clinical consultation, please call [phone]. ====================================================================     No  results found for: CBDTHCR No results found for: D8THCCBX No results found for: D9THCCBX  ROS  Constitutional: Denies any fever or chills Gastrointestinal: No reported hemesis, hematochezia, vomiting, or acute GI distress Musculoskeletal: Denies any acute onset joint swelling, redness, loss of ROM, or weakness Neurological: No reported episodes of acute onset apraxia, aphasia, dysarthria, agnosia, amnesia, paralysis, loss of coordination, or loss of consciousness  Medication Review  Menthol  (Topical Analgesic), Vitamin D3, albuterol , aspirin , budesonide-formoterol, cetirizine, citalopram , cyclobenzaprine , dextromethorphan-guaiFENesin , docusate sodium , fluticasone , ipratropium-albuterol , levothyroxine , losartan , oxyCODONE , and tiotropium  History Review  Allergy: Danielle [name] is allergic to aripiprazole, duloxetine, gabapentin, gold, iodinated contrast media, naproxen sodium, nortriptyline hcl, nsaids, pregabalin, acetaminophen , meperidine, ace inhibitors, aspirin , cefpodoxime, fluoxetine, fluticasone -salmeterol, levofloxacin, lithium, paroxetine, tape, telithromycin, theophylline, topiramate, trazodone, triamcinolone , venlafaxine, bupropion, ketorolac  tromethamine , morphine, and moxifloxacin. Drug: Danielle [name]  reports no history of drug use. Alcohol:  reports that she does not currently use alcohol. Tobacco:  reports that she has been smoking cigarettes. She has a 80 pack-year smoking history. She has never used smokeless tobacco. Social: Danielle [name]  reports that she has been smoking cigarettes. She has a 80 pack-year smoking history. She has never used smokeless tobacco. She reports that she does not currently use alcohol. She reports that she does not use drugs. Medical:  has a past medical history of Acid reflux, Anxiety, Arthritis, Asthma, Bell's palsy, Bursitis, Complication of anesthesia, COPD (chronic obstructive pulmonary disease) (HCC), Depression, Fibromyalgia, Heart murmur,  Hepatitis C, Hiatal hernia, Hyperlipidemia, Hypertension, Hypothyroidism, IBS (irritable bowel syndrome), Insomnia, Osteoarthritis, and Post-nasal drip. Surgical: Danielle [name]  has a past surgical history that includes necksurgery (10/21/2014); Back surgery (2013); Foot surgery (Right); Elbow surgery (Left); Carpal tunnel release (Right); Nose surgery; Partial hysterectomy; Total shoulder replacement (Bilateral); Colonoscopy with propofol  (N/A, 02/05/2017); and Total hip arthroplasty (Right, 11/11/2023). Family: family history includes Cancer in her mother; Gout in her mother; Heart disease in her father.  Laboratory Chemistry Profile   Renal Lab Results  Component Value Date   BUN 18 11/12/2023   CREATININE 1.73 (H) 11/12/2023   BCR 13 02/16/2019   GFRAA >60 04/28/2019   GFRNONAA 33 (L) 11/12/2023    Hepatic Lab Results  Component Value Date   AST 19 10/31/2023   ALT 17 10/31/2023   ALBUMIN  3.5 10/31/2023   ALKPHOS 54 10/31/2023    Electrolytes Lab Results  Component Value Date   NA 131 (L) 11/12/2023   K 4.5 11/12/2023   CL 100 11/12/2023  CALCIUM 8.3 (L) 11/12/2023   MG 2.1 02/16/2019    Bone Lab Results  Component Value Date   25OHVITD1 27 (L) 02/16/2019   25OHVITD2 2.0 02/16/2019   25OHVITD3 25 02/16/2019    Inflammation (CRP: Acute Phase) (ESR: Chronic Phase) Lab Results  Component Value Date   CRP 2 02/16/2019   ESRSEDRATE 23 02/16/2019         Note: Above Lab results reviewed.  Recent Imaging Review  DG Pelvis Portable CLINICAL DATA:  Status post right hip replacement.  EXAM: PORTABLE PELVIS 1-2 VIEWS  COMPARISON:  None Available.  FINDINGS: Right hip arthroplasty in expected alignment. No periprosthetic lucency or fracture. Recent postsurgical change includes air and edema in the soft tissues.  IMPRESSION: Right hip arthroplasty without immediate postoperative complication.  Electronically Signed   By: Andrea Gasman M.D.   On: 11/11/2023  11:34 DG HIP UNILAT WITH PELVIS 1V RIGHT CLINICAL DATA:  Elective surgery.  EXAM: DG HIP (WITH OR WITHOUT PELVIS) 1V RIGHT  COMPARISON:  None Available.  FINDINGS: Three fluoroscopic spot views of the pelvis and right hip obtained in the operating room. Images during hip arthroplasty. Fluoroscopy time 28 seconds. Dose 4.63 mGy.  IMPRESSION: Intraoperative fluoroscopy during right hip arthroplasty.  Electronically Signed   By: Andrea Gasman M.D.   On: 11/11/2023 10:46 DG C-Arm 1-60 Min-No Report Fluoroscopy was utilized by the requesting physician.  No radiographic  interpretation.  Note: Reviewed        Physical Exam  General appearance: Well nourished, well developed, and well hydrated. In no apparent acute distress Mental status: Alert, oriented x 3 (person, place, & time)       Respiratory: No evidence of acute respiratory distress Eyes: PERLA Vitals: There were no vitals taken for this visit. BMI: Estimated body mass index is 38.94 kg/m as calculated from the following:   Height as of 11/11/23: 5' 5 (1.651 m).   Weight as of 11/11/23: 234 lb (106.1 kg). Ideal: Patient weight not recorded  Assessment   Diagnosis Status  1. Chronic hip pain (2ry area of Pain) (Bilateral) (R>L)   2. Greater trochanteric bursitis of hips (Bilateral)   3. 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)   4. Chronic groin pain (Bilateral)   5. Chronic hip pain (Right)   6. Chronic hip pain (Left)   7. Acetabular labrum tear, sequela (Right)   8. Acetabular labrum tear, sequela (Left)    Controlled Controlled Controlled   Updated Problems: No problems updated.  Plan of Care  Problem-specific:  Assessment and Plan            Danielle [name] has a current medication list which includes the following long-term medication(s): albuterol , citalopram , fluticasone , ipratropium-albuterol , levothyroxine , losartan , and symbicort.  Pharmacotherapy (Medications Ordered): No orders of  the defined types were placed in this encounter.  Orders:  No orders of the defined types were placed in this encounter.    Interventional Therapies  Risk Factors  Considerations:   Allergy (Anaphylactic-type): Contrast dye.  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 (+) COCAINE.   Planned  Pending:   Therapeutic bilateral IA hip joint injection + trochanteric bursa injection.  Therapeutic left suprascapular nerve RFA #2    Under consideration:   Diagnostic cervical spinal cord stimulator trial  Diagnostic caudal ESI + epidurogram  Diagnostic right CESI  Diagnostic/therapeutic right  cervical facet MBB #2 Diagnostic left SI Blk    Completed:   Palliative left trochanteric bursa x2 (09/18/2021) (100/100/90)  Palliative right trochanteric bursa x4 (09/17/2022) (100/100/90/70)  Palliative right suprascapular NB x3 (08/08/2020) (50/50/100/90-100)   Palliative left suprascapular NB x4 (02/22/2021) (100/100/90/90)  Palliative bilateral suprascapular nerve RFA x1 (05/13/2019) (100/100/90/90-100)  Therapeutic right IA hip joint injection x6 (09/17/2022) (100/100/70/70)  Therapeutic left IA hip joint injection x4 (09/18/2021) (100/100/90)  Diagnostic bilateral lumbar facet MBB x4 (09/17/2022) (100/100/70/70)  Palliative right lumbar facet RFA x2 (12/16/2017) (100/100/85)  Palliative left lumbar facet RFA x2 (02/12/2018) [facility])  Diagnostic right cervical facet MBB x2 (04/11/2022) (100/100/100/100)  Diagnostic left cervical facet MBB x1 (09/30/2016) (90/100/0)  Diagnostic right SI joint block x1 (06/12/2017) (DNKFU -0/0/80)    Therapeutic  Palliative (PRN) options:   Therapeutic trochanteric bursa   Therapeutic suprascapular NB   Therapeutic suprascapular nerve RFA   Therapeutic IA hip joint injection   Therapeutic lumbar facet MBB   Therapeutic lumbar facet RFA    Therapeutic SI Blk     Pharmacotherapy  Nonopioids transferred 05/15/2020: Zanaflex  and Voltaren  gel      No follow-ups on file.    Recent Visits No visits were found meeting these conditions. Showing recent visits within past 90 days and meeting all other requirements Future Appointments Date Type Provider Dept  01/05/24 Appointment [name] Glisson, MD [facility]  Showing future appointments within next 90 days and meeting all other requirements  I discussed the assessment and treatment plan with the patient. The patient was provided an opportunity to ask questions and all were answered. The patient agreed with the plan and demonstrated an understanding of the instructions.  Patient advised to call back or seek an in-person evaluation if the symptoms or condition worsens.  Duration of encounter: *** minutes.  Total time on encounter, as per AMA guidelines included both the face-to-face and non-face-to-face time personally spent by the physician and/or other qualified health care professional(s) on the day of the encounter (includes time in activities that require the physician or other qualified health care professional and does not include time in activities normally performed by clinical staff). Physician's time may include the following activities when performed: Preparing to see the patient (e.g., pre-charting review of records, searching for previously ordered imaging, lab work, and nerve conduction tests) Review of prior analgesic pharmacotherapies. Reviewing PMP Interpreting ordered tests (e.g., lab work, imaging, nerve conduction tests) Performing post-procedure evaluations, including interpretation of diagnostic procedures Obtaining and/or reviewing separately obtained history Performing a medically appropriate examination and/or evaluation Counseling and educating the patient/family/caregiver Ordering medications, tests, or procedures Referring and  communicating with other health care professionals (when not separately reported) Documenting clinical information in the electronic or other health record Independently interpreting results (not separately reported) and communicating results to the patient/ family/caregiver Care coordination (not separately reported)  Note by: Glisson DELENA Tanya, MD (TTS and AI technology used. I apologize for any typographical errors that were not detected and corrected.) Date: 01/05/2024; Time: 12:15 PM

## 2024-01-05 ENCOUNTER — Encounter: Payer: Self-pay | Admitting: Pain Medicine

## 2024-01-05 ENCOUNTER — Ambulatory Visit: Attending: Pain Medicine | Admitting: Pain Medicine

## 2024-01-05 VITALS — BP 118/70 | HR 96 | Temp 97.9°F | Resp 16 | Ht 65.0 in | Wt 230.0 lb

## 2024-01-05 DIAGNOSIS — G8929 Other chronic pain: Secondary | ICD-10-CM | POA: Diagnosis present

## 2024-01-05 DIAGNOSIS — R937 Abnormal findings on diagnostic imaging of other parts of musculoskeletal system: Secondary | ICD-10-CM | POA: Diagnosis not present

## 2024-01-05 DIAGNOSIS — M545 Low back pain, unspecified: Secondary | ICD-10-CM | POA: Diagnosis present

## 2024-01-05 DIAGNOSIS — Z96641 Presence of right artificial hip joint: Secondary | ICD-10-CM | POA: Diagnosis present

## 2024-01-05 DIAGNOSIS — M47817 Spondylosis without myelopathy or radiculopathy, lumbosacral region: Secondary | ICD-10-CM | POA: Insufficient documentation

## 2024-01-05 DIAGNOSIS — M7061 Trochanteric bursitis, right hip: Secondary | ICD-10-CM

## 2024-01-05 DIAGNOSIS — M5459 Other low back pain: Secondary | ICD-10-CM | POA: Diagnosis present

## 2024-01-05 DIAGNOSIS — M25551 Pain in right hip: Secondary | ICD-10-CM | POA: Insufficient documentation

## 2024-01-05 DIAGNOSIS — S73191S Other sprain of right hip, sequela: Secondary | ICD-10-CM

## 2024-01-05 DIAGNOSIS — S73192S Other sprain of left hip, sequela: Secondary | ICD-10-CM | POA: Diagnosis present

## 2024-01-05 DIAGNOSIS — M47816 Spondylosis without myelopathy or radiculopathy, lumbar region: Secondary | ICD-10-CM | POA: Diagnosis present

## 2024-01-05 DIAGNOSIS — R1031 Right lower quadrant pain: Secondary | ICD-10-CM

## 2024-01-05 DIAGNOSIS — M25552 Pain in left hip: Secondary | ICD-10-CM | POA: Diagnosis present

## 2024-01-05 DIAGNOSIS — M160 Bilateral primary osteoarthritis of hip: Secondary | ICD-10-CM

## 2024-01-05 NOTE — Progress Notes (Signed)
 Safety precautions to be maintained throughout the outpatient stay will include: orient to surroundings, keep bed in low position, maintain call bell within reach at all times, provide assistance with transfer out of bed and ambulation.

## 2024-01-05 NOTE — Patient Instructions (Signed)

## 2024-01-15 ENCOUNTER — Ambulatory Visit
Admission: RE | Admit: 2024-01-15 | Discharge: 2024-01-15 | Disposition: A | Discharge status: Home / Self Care | Source: Ambulatory Visit | Attending: Pain Medicine | Admitting: Pain Medicine

## 2024-01-15 ENCOUNTER — Encounter: Payer: Self-pay | Admitting: Pain Medicine

## 2024-01-15 ENCOUNTER — Ambulatory Visit (HOSPITAL_BASED_OUTPATIENT_CLINIC_OR_DEPARTMENT_OTHER): Admitting: Pain Medicine

## 2024-01-15 VITALS — BP 170/88 | HR 96 | Temp 98.1°F | Resp 15 | Ht 65.0 in | Wt 226.0 lb

## 2024-01-15 DIAGNOSIS — M47817 Spondylosis without myelopathy or radiculopathy, lumbosacral region: Secondary | ICD-10-CM | POA: Diagnosis not present

## 2024-01-15 DIAGNOSIS — Z91041 Radiographic dye allergy status: Secondary | ICD-10-CM | POA: Diagnosis present

## 2024-01-15 DIAGNOSIS — Z96641 Presence of right artificial hip joint: Secondary | ICD-10-CM | POA: Diagnosis present

## 2024-01-15 DIAGNOSIS — Z87892 Personal history of anaphylaxis: Secondary | ICD-10-CM | POA: Insufficient documentation

## 2024-01-15 DIAGNOSIS — Z889 Allergy status to unspecified drugs, medicaments and biological substances status: Secondary | ICD-10-CM | POA: Insufficient documentation

## 2024-01-15 DIAGNOSIS — M5459 Other low back pain: Secondary | ICD-10-CM

## 2024-01-15 DIAGNOSIS — M961 Postlaminectomy syndrome, not elsewhere classified: Secondary | ICD-10-CM

## 2024-01-15 DIAGNOSIS — R937 Abnormal findings on diagnostic imaging of other parts of musculoskeletal system: Secondary | ICD-10-CM | POA: Insufficient documentation

## 2024-01-15 DIAGNOSIS — M545 Low back pain, unspecified: Secondary | ICD-10-CM

## 2024-01-15 DIAGNOSIS — M47816 Spondylosis without myelopathy or radiculopathy, lumbar region: Secondary | ICD-10-CM | POA: Diagnosis present

## 2024-01-15 DIAGNOSIS — G8929 Other chronic pain: Secondary | ICD-10-CM | POA: Insufficient documentation

## 2024-01-15 MED ORDER — ROPIVACAINE HCL 2 MG/ML IJ SOLN
18.0000 mL | Freq: Once | INTRAMUSCULAR | Status: AC
  Administered 2024-01-15: 18 mL via PERINEURAL

## 2024-01-15 MED ORDER — TRIAMCINOLONE ACETONIDE 40 MG/ML IJ SUSP
80.0000 mg | Freq: Once | INTRAMUSCULAR | Status: AC
  Administered 2024-01-15: 80 mg

## 2024-01-15 MED ORDER — MIDAZOLAM HCL 5 MG/5ML IJ SOLN
INTRAMUSCULAR | Status: AC
  Filled 2024-01-15: qty 5

## 2024-01-15 MED ORDER — PENTAFLUOROPROP-TETRAFLUOROETH EX AERO
INHALATION_SPRAY | Freq: Once | CUTANEOUS | Status: AC
  Administered 2024-01-15: 30 via TOPICAL

## 2024-01-15 MED ORDER — LIDOCAINE HCL 2 % IJ SOLN
20.0000 mL | Freq: Once | INTRAMUSCULAR | Status: AC
  Administered 2024-01-15: 400 mg

## 2024-01-15 MED ORDER — FENTANYL CITRATE (PF) 100 MCG/2ML IJ SOLN
INTRAMUSCULAR | Status: AC
  Filled 2024-01-15: qty 2

## 2024-01-15 MED ORDER — LIDOCAINE HCL 2 % IJ SOLN
INTRAMUSCULAR | Status: AC
  Filled 2024-01-15: qty 20

## 2024-01-15 MED ORDER — TRIAMCINOLONE ACETONIDE 40 MG/ML IJ SUSP
INTRAMUSCULAR | Status: AC
  Filled 2024-01-15: qty 2

## 2024-01-15 MED ORDER — ROPIVACAINE HCL 2 MG/ML IJ SOLN
INTRAMUSCULAR | Status: AC
Start: 2024-01-15 — End: 2024-01-15
  Filled 2024-01-15: qty 20

## 2024-01-15 MED ORDER — FENTANYL CITRATE (PF) 100 MCG/2ML IJ SOLN
25.0000 ug | INTRAMUSCULAR | Status: AC | PRN
  Administered 2024-01-15 (×2): 50 ug via INTRAVENOUS

## 2024-01-15 MED ORDER — VANCOMYCIN HCL IN DEXTROSE 1-5 GM/200ML-% IV SOLN
1000.0000 mg | Freq: Once | INTRAVENOUS | Status: AC
  Administered 2024-01-15: 1000 mg via INTRAVENOUS
  Filled 2024-01-15: qty 200

## 2024-01-15 MED ORDER — MIDAZOLAM HCL 5 MG/5ML IJ SOLN
0.5000 mg | Freq: Once | INTRAMUSCULAR | Status: AC
  Administered 2024-01-15: 1 mg via INTRAVENOUS
  Administered 2024-01-15: 2 mg via INTRAVENOUS

## 2024-01-15 NOTE — Progress Notes (Signed)
 PROVIDER NOTE: Interpretation of information contained herein should be left to medically-trained personnel. Specific patient instructions are provided elsewhere under Patient Instructions section of medical record. This document was created in part using STT-dictation technology, any transcriptional errors that may result from this process are unintentional.  Patient: Danielle [name] Type: Established DOB: 1961/06/11 MRN: [id number] PCP: Alyse Bradley, MD  Service: Procedure DOS: 01/15/2024 Setting: Ambulatory Location: Ambulatory outpatient facility Delivery: Face-to-face Provider: Eric DELENA Como, MD Specialty: Interventional Pain Management Specialty designation: [id number] Location: Outpatient facility Ref. Prov.: Como Eric, MD       Interventional Therapy   Type: Lumbar Facet, Medial Branch Block(s)            Laterality: Bilateral  Level: L2, L3, L4, L5, and S1 Medial Branch/Dorsal Rami Level(s). Injecting these levels blocks the L3-4, L4-5, and L5-S1 lumbar facet joints. Imaging: Fluoroscopic guidance Spinal ([id number]) Anesthesia: Local anesthesia (1-2% Lidocaine ) Anxiolysis: IV Versed  3.0 mg Sedation: Moderate Sedation Fentanyl  2.0 mL (100 mcg) DOS: 01/15/2024 Performed by: Eric DELENA Como, MD  Primary Purpose: Diagnostic/Therapeutic Indications: Low back pain severe enough to impact quality of life or function. 1. Chronic low back pain (Bilateral) (R>L) w/o sciatica   2. Lumbar facet joint pain   3. Lumbar facet syndrome (Bilateral) (R>L)   4. Spondylosis without myelopathy or radiculopathy, lumbosacral region   5. Failed back surgical syndrome   6. Abnormal MRI, lumbar spine (09/28/2020)   7. Status post total replacement of hip (Right)   8. History of allergy to iodine & radiographic dye   9. History of drug-induced anaphylaxis (Contrast Dye)   10. History of multiple allergies    NAS-11 Pain score:   Pre-procedure: 4 /10   Post-procedure: 0-No pain/10      Position / Prep / Materials:  Position: Prone  Prep solution: ChloraPrep (2% chlorhexidine  gluconate and 70% isopropyl alcohol) Area Prepped: Posterolateral Lumbosacral Spine (Wide prep: From the lower border of the scapula down to the end of the tailbone and from flank to flank.)  Materials:  Tray: Block Needle(s):  Type: Spinal  Gauge (G): 22  Length: 5-in Qty: 4     H&P (Pre-op Assessment):  Danielle [name] is a 63 y.o. (year old), female patient, seen today for interventional treatment. 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 Danielle [name] has a current medication list which includes the following prescription(s): albuterol , aspirin , cetirizine, vitamin d3, citalopram , cyclobenzaprine , dextromethorphan-guaifenesin , docusate sodium , fluticasone , ipratropium-albuterol , levothyroxine , losartan , menthol  (topical analgesic), oxycodone , spiriva  handihaler, and symbicort. Her primarily concern today is the Back Pain (lower)  Initial Vital Signs:  Pulse/HCG Rate: (!) 105ECG Heart Rate: 93 Temp: (!) 97.5 F (36.4 C) Resp: 18 BP: (!) 133/109 SpO2: 99 %  BMI: Estimated body mass index is 37.61 kg/m as calculated from the following:   Height as of this encounter: 5' 5 (1.651 m).   Weight as of this encounter: 226 lb (102.5 kg).  Risk Assessment: Allergies: Reviewed. She is allergic to aripiprazole, duloxetine, gabapentin, gold, iodinated contrast media, naproxen sodium, nortriptyline hcl, nsaids, pregabalin, acetaminophen , meperidine, ace inhibitors, aspirin , cefpodoxime, fluoxetine, fluticasone -salmeterol, levofloxacin, lithium, paroxetine, tape, telithromycin, theophylline, topiramate, trazodone,  triamcinolone , venlafaxine, bupropion, ketorolac  tromethamine , morphine, and moxifloxacin.  Allergy Precautions: None required Coagulopathies: Reviewed. None identified.  Blood-thinner therapy: None at this time Active  Infection(s): Reviewed. None identified. Danielle [name] is afebrile  Site Confirmation: Danielle [name] was asked to confirm the procedure and laterality before marking the site Procedure checklist: Completed Consent: Before the procedure and under the influence of no sedative(s), amnesic(s), or anxiolytics, the patient was informed of the treatment options, risks and possible complications. To fulfill our ethical and legal obligations, as recommended by the American Medical Association's Code of Ethics, I have informed the patient of my clinical impression; the nature and purpose of the treatment or procedure; the risks, benefits, and possible complications of the intervention; the alternatives, including doing nothing; the risk(s) and benefit(s) of the alternative treatment(s) or procedure(s); and the risk(s) and benefit(s) of doing nothing. The patient was provided information about the general risks and possible complications associated with the procedure. These may include, but are not limited to: failure to achieve desired goals, infection, bleeding, organ or nerve damage, allergic reactions, paralysis, and death. In addition, the patient was informed of those risks and complications associated to Spine-related procedures, such as failure to decrease pain; infection (i.e.: Meningitis, epidural or intraspinal abscess); bleeding (i.e.: epidural hematoma, subarachnoid hemorrhage, or any other type of intraspinal or peri-dural bleeding); organ or nerve damage (i.e.: Any type of peripheral nerve, nerve root, or spinal cord injury) with subsequent damage to sensory, motor, and/or autonomic systems, resulting in permanent pain, numbness, and/or weakness of one or several areas of the body; allergic  reactions; (i.e.: anaphylactic reaction); and/or death. Furthermore, the patient was informed of those risks and complications associated with the medications. These include, but are not limited to: allergic reactions (i.e.: anaphylactic or anaphylactoid reaction(s)); adrenal axis suppression; blood sugar elevation that in diabetics may result in ketoacidosis or comma; water retention that in patients with history of congestive heart failure may result in shortness of breath, pulmonary edema, and decompensation with resultant heart failure; weight gain; swelling or edema; medication-induced neural toxicity; particulate matter embolism and blood vessel occlusion with resultant organ, and/or nervous system infarction; and/or aseptic necrosis of one or more joints. Finally, the patient was informed that Medicine is not an exact science; therefore, there is also the possibility of unforeseen or unpredictable risks and/or possible complications that may result in a catastrophic outcome. The patient indicated having understood very clearly. We have given the patient no guarantees and we have made no promises. Enough time was given to the patient to ask questions, all of which were answered to the patient's satisfaction. Ms. Mines has indicated that she wanted to continue with the procedure. Attestation: I, the ordering provider, attest that I have discussed with the patient the benefits, risks, side-effects, alternatives, likelihood of achieving goals, and potential problems during recovery for the procedure that I have provided informed consent. Date  Time: 01/15/2024  8:30 AM  Pre-Procedure Preparation:  Monitoring: As per clinic protocol. Respiration, ETCO2, SpO2, BP, heart rate and rhythm monitor placed and checked for adequate function Safety Precautions: Patient was assessed for positional comfort and pressure points before starting the procedure. Time-out: I initiated and conducted the Time-out before  starting the procedure, as per protocol. The patient was asked to participate by confirming the accuracy of the Time Out information. Verification of the correct person, site, and procedure were performed and confirmed by me, the nursing staff, and the patient. Time-out conducted as per Joint Commission's Universal Protocol (UP.01.01.01). Time:   Start Time:   hrs.  Description of Procedure:          Laterality: (see above) Targeted  Levels: (see above)  Safety Precautions: Aspiration looking for blood return was conducted prior to all injections. At no point did we inject any substances, as a needle was being advanced. Before injecting, the patient was told to immediately notify me if she was experiencing any new onset of ringing in the ears, or metallic taste in the mouth. No attempts were made at seeking any paresthesias. Safe injection practices and needle disposal techniques used. Medications properly checked for expiration dates. SDV (single dose vial) medications used. After the completion of the procedure, all disposable equipment used was discarded in the proper designated medical waste containers. Local Anesthesia: Protocol guidelines were followed. The patient was positioned over the fluoroscopy table. The area was prepped in the usual manner. The time-out was completed. The target area was identified using fluoroscopy. A 12-in long, straight, sterile hemostat was used with fluoroscopic guidance to locate the targets for each level blocked. Once located, the skin was marked with an approved surgical skin marker. Once all sites were marked, the skin (epidermis, dermis, and hypodermis), as well as deeper tissues (fat, connective tissue and muscle) were infiltrated with a small amount of a short-acting local anesthetic, loaded on a 10cc syringe with a 25G, 1.5-in  Needle. An appropriate amount of time was allowed for local anesthetics to take effect before proceeding to the next step. Local  Anesthetic: Lidocaine  2.0% The unused portion of the local anesthetic was discarded in the proper designated containers. Technical description of process:  Medial Branch  Dorsal Rami Nerve Block (MBB):  Neuroanatomy note: Each lumbar facet joint receives dual innervation from medial branches arising from the posterior primary rami at the same level and one level above. The target for each lumbar medial branch is the junction of the ipsilateral superior articular and transverse process of the lower vertebral body. (i.e.: The L4-L5 facet joint is innervated by the L4 medial branch [located at L5] and the L3 medial branch [located at L4]. Blocking the L4 Medial Branch is therefore achieved by injecting at the junction of the ipsilateral superior articular and transverse process of the lower vertebral body [L5].).  Exception: The exception to the above rule is the L5-S1 facet joint which has triple innervation requiring the L4 medial branch, as well as the L5 and the S1 Dorsal Rami(s) to be blocked to fully denervate the joint.  Under fluoroscopic guidance, a needle was inserted until contact was made with os over the target area. After negative aspiration, 0.5 mL of the nerve block solution was injected without difficulty or complication. Paresthesia were avoided during injection. The needle(s) were removed intact and without complication.  Once the entire procedure was completed, the treated area was cleaned, making sure to leave some of the prepping solution back to take advantage of its long term bactericidal properties.         Illustration of the posterior view of the lumbar spine and the posterior neural structures. Laminae of L2 through S1 are labeled. DPRL5, dorsal primary ramus of L5; DPRS1, dorsal primary ramus of S1; DPR3, dorsal primary ramus of L3; FJ, facet (zygapophyseal) joint L3-L4; I, inferior articular process of L4; LB1, lateral branch of dorsal primary ramus of L1; IAB, inferior  articular branches from L3 medial branch (supplies L4-L5 facet joint); IBP, intermediate branch plexus; MB3, medial branch of dorsal primary ramus of L3; NR3, third lumbar nerve root; S, superior articular process of L5; SAB, superior articular branches from L4 (supplies L4-5 facet joint also); TP3, transverse process  of L3.   Facet Joint Innervation (* possible contribution)  L1-2 T12, L1 (L2*)  Medial Branch  L2-3 L1, L2 (L3*)                     L3-4 L2, L3 (L4*)                     L4-5 L3, L4 (L5*)                     L5-S1 L4, L5, S1                        Vitals:   01/15/24 0949 01/15/24 0955 01/15/24 1005 01/15/24 1015  BP: 112/64 (!) 100/54 98/65 (!) 170/88  Pulse: 96     Resp: 16 15 17 15   Temp:  98 F (36.7 C)  98.1 F (36.7 C)  TempSrc:      SpO2: 100% 96% 98% 98%  Weight:      Height:         End Time: 0949 hrs.  Imaging Guidance (Spinal):         Type of Imaging Technique: Fluoroscopy Guidance (Spinal) Indication(s): Fluoroscopy guidance for needle placement to enhance accuracy in procedures requiring precise needle localization for targeted delivery of medication in or near specific anatomical locations not easily accessible without such real-time imaging assistance. Exposure Time: Please see nurses notes. Contrast: None used. Fluoroscopic Guidance: I was personally present during the use of fluoroscopy. Tunnel Vision Technique used to obtain the best possible view of the target area. Parallax error corrected before commencing the procedure. Direction-depth-direction technique used to introduce the needle under continuous pulsed fluoroscopy. Once target was reached, antero-posterior, oblique, and lateral fluoroscopic projection used confirm needle placement in all planes. Images permanently stored in EMR. Interpretation: No contrast injected. I personally interpreted the imaging intraoperatively. Adequate needle placement confirmed in multiple planes.  Permanent images saved into the patient's record.  Post-operative Assessment:  Post-procedure Vital Signs:  Pulse/HCG Rate: 9690 Temp: 98.1 F (36.7 C) Resp: 15 BP: (!) 170/88 SpO2: 98 %  EBL: None  Complications: No immediate post-treatment complications observed by team, or reported by patient.  Note: The patient tolerated the entire procedure well. A repeat set of vitals were taken after the procedure and the patient was kept under observation following institutional policy, for this type of procedure. Post-procedural neurological assessment was performed, showing return to baseline, prior to discharge. The patient was provided with post-procedure discharge instructions, including a section on how to identify potential problems. Should any problems arise concerning this procedure, the patient was given instructions to immediately contact us , at any time, without hesitation. In any case, we plan to contact the patient by telephone for a follow-up status report regarding this interventional procedure.  Comments:  No additional relevant information.  Plan of Care (POC)  Orders:  Orders Placed This Encounter  Procedures   LUMBAR FACET(MEDIAL BRANCH NERVE BLOCK) MBNB    Scheduling Instructions:     Procedure: Lumbar facet block (AKA.: Lumbosacral medial branch nerve block)     Side: Bilateral     Level: L3-4, L4-5, and L5-S1 Facets (L2, L3, L4, L5, and S1 Medial Branch Nerves)     Sedation: Patient's choice.     Date: 01/15/2024    Where will this procedure be performed?:   [facility]   DG [facility] C-ARM 1-60 MIN NO REPORT  Intraoperative interpretation by procedural physician at [facility].    Standing Status:   Standing    Number of Occurrences:   1    Reason for exam::   Assistance in needle guidance and placement for procedures requiring needle placement in or near specific anatomical locations not easily accessible without such assistance.   Informed  Consent Details: Physician/Practitioner Attestation; Transcribe to consent form and obtain patient signature    Nursing Order: Transcribe to consent form and obtain patient signature. Note: Always confirm laterality of pain with Ms. Oneita, before procedure.    Physician/Practitioner attestation of informed consent for procedure/surgical case:   I, the physician/practitioner, attest that I have discussed with the patient the benefits, risks, side effects, alternatives, likelihood of achieving goals and potential problems during recovery for the procedure that I have provided informed consent.    Procedure:   Lumbar Facet Block  under fluoroscopic guidance    Physician/Practitioner performing the procedure:   Latoya Diskin A. Tanya MD    Indication/Reason:   Low Back Pain, with our without leg pain, due to Facet Joint Arthralgia (Joint Pain) Spondylosis (Arthritis of the Spine), without myelopathy or radiculopathy (Nerve Damage).   Provide equipment / supplies at bedside    Procedure tray: Block Tray (Disposable  single use) Skin infiltration needle: Regular 1.5-in, 25-G, (x1) Block Needle type: Spinal Amount/quantity: 4 Size: Medium (5-inch) Gauge: 22G    Standing Status:   Standing    Number of Occurrences:   1    Specify:   Block Tray   Saline lock IV    Have LR [phone] mL available and administer at 125 mL/hr if patient becomes hypotensive.    Standing Status:   Standing    Number of Occurrences:   1   Miscellanous precautions    Standing Status:   Standing    Number of Occurrences:   1    Opioid Analgesic(s): Analgesic: No chronic opioid analgesics therapy prescribed by our practice.  Discontinued (11/21/2021) UDS. (+) COCAINE.   Medications ordered for procedure: Meds ordered this encounter  Medications   vancomycin  (VANCOCIN ) IVPB 1000 mg/200 mL premix    Pediatric Recommended Dose: 15 mg/kg    Indication::   Other Indication (list below)    Other Indication::   Procedure  Prophylaxis   lidocaine  (XYLOCAINE ) 2 % (with pres) injection 400 mg   pentafluoroprop-tetrafluoroeth (GEBAUERS) aerosol   midazolam  (VERSED ) 5 MG/5ML injection 0.5-2 mg    Make sure Flumazenil is available in the pyxis when using this medication. If oversedation occurs, administer 0.2 mg IV over 15 sec. If after 45 sec no response, administer 0.2 mg again over 1 min; may repeat at 1 min intervals; not to exceed 4 doses (1 mg)   fentaNYL  (SUBLIMAZE ) injection 25-50 mcg    Make sure Narcan  is available in the pyxis when using this medication. In the event of respiratory depression (RR< 8/min): Titrate NARCAN  (naloxone ) in increments of 0.1 to 0.2 mg IV at 2-3 minute intervals, until desired degree of reversal.   ropivacaine  (PF) 2 mg/mL (0.2%) (NAROPIN ) injection 18 mL   triamcinolone  acetonide (KENALOG -40) injection 80 mg   Medications administered: We administered vancomycin , lidocaine , pentafluoroprop-tetrafluoroeth, midazolam , fentaNYL , ropivacaine  (PF) 2 mg/mL (0.2%), and triamcinolone  acetonide.  See the medical record for exact dosing, route, and time of administration.    Interventional Therapies  Risk Factors  Considerations:   Allergy (Anaphylactic-type): Contrast dye.  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 (+) COCAINE.   Planned  Pending:   Therapeutic bilateral lumbar facet MBB #6 (antibiotics required due to recent right total hip replacement)   Under consideration:   Therapeutic left IA hip injection + TBI #3  Therapeutic bilateral lumbar facet RFA #3    Completed:   Palliative left trochanteric bursa x2 (09/18/2021) (100/100/90)  Palliative right trochanteric bursa x4 (09/17/2022) (100/100/90/70)  Palliative right suprascapular NB x3 (08/08/2020) (50/50/100/90-100)   Palliative left suprascapular NB x4 (02/22/2021) (100/100/90/90)  Palliative  bilateral suprascapular nerve RFA x1 (05/13/2019) (100/100/90/90-100)  Therapeutic right IA hip joint injection x6 (09/17/2022) (100/100/70/70)  Therapeutic left IA hip joint injection x4 (09/18/2021) (100/100/90)  Diagnostic bilateral lumbar facet MBB x5 (03/11/2023) (100/90/70/60)  Palliative right lumbar facet RFA x2 (12/16/2017) (100/100/85)  Palliative left lumbar facet RFA x2 (02/12/2018) [facility])  Diagnostic right cervical facet MBB x2 (04/11/2022) (100/100/100/100)  Diagnostic left cervical facet MBB x1 (09/30/2016) (90/100/0)  Diagnostic right SI joint block x1 (06/12/2017) (DNKFU -0/0/80)    Therapeutic  Palliative (PRN) options:  Therapeutic suprascapular nerve RFA  Therapeutic lumbar facet RFA    Pharmacotherapy  Nonopioids transferred 05/15/2020: Zanaflex  and Voltaren  gel       Follow-up plan:   Return in about 2 weeks (around 01/29/2024) for (Face2F), (PPE).     Recent Visits Date Type Provider Dept  01/05/24 Office Visit Tanya Glisson, MD [facility]  Showing recent visits within past 90 days and meeting all other requirements Today's Visits Date Type Provider Dept  01/15/24 Procedure visit Tanya Glisson, MD [facility]  Showing today's visits and meeting all other requirements Future Appointments Date Type Provider Dept  01/29/24 Appointment Tanya Glisson, MD [facility]  Showing future appointments within next 90 days and meeting all other requirements   Disposition: Discharge home  Discharge (Date  Time): 01/15/2024; 1022 hrs.   Primary Care Physician: Alyse Bradley, MD Location: [facility] Note by: Glisson DELENA Tanya, MD (TTS technology used. I apologize for any typographical errors that were not detected and corrected.) Date: 01/15/2024; Time: 10:42 AM  Disclaimer:  Medicine is not an Visual merchandiser. The only guarantee in medicine is that nothing is guaranteed. It is important to note  that the decision to proceed with this intervention was based on the information collected from the patient. The Data and conclusions were drawn from the patient's questionnaire, the interview, and the physical examination. Because the information was provided in large part by the patient, it cannot be guaranteed that it has not been purposely or unconsciously manipulated. Every effort has been made to obtain as much relevant data as possible for this evaluation. It is important to note that the conclusions that lead to this procedure are derived in large part from the available data. Always take into account that the treatment will also be dependent on availability of resources and existing treatment guidelines, considered by other Pain Management Practitioners as being common knowledge and practice, at the time of the intervention. For Medico-Legal purposes, it is also important to point out that variation in procedural techniques and pharmacological choices are the acceptable norm. The indications, contraindications, technique, and results of the above procedure should only be interpreted and judged by a Board-Certified Interventional Pain Specialist with extensive familiarity and expertise in the same exact procedure and technique.

## 2024-01-15 NOTE — Patient Instructions (Signed)

## 2024-01-19 ENCOUNTER — Encounter: Admitting: Orthopaedic Surgery

## 2024-01-28 NOTE — Progress Notes (Signed)
 Department: [place] [facility] at [facility]) Date: 01/28/2024  Event: Canceled/Rescheduled by patient..  Encounter Type: (PPE) Post-procedure evaluation.          Advance notice: Proper notice provided.  Reason: Transportation difficulties.          Note: n/a (not applicable).

## 2024-01-29 ENCOUNTER — Ambulatory Visit: Admitting: Pain Medicine

## 2024-01-29 DIAGNOSIS — Z09 Encounter for follow-up examination after completed treatment for conditions other than malignant neoplasm: Secondary | ICD-10-CM

## 2024-01-29 DIAGNOSIS — Z91199 Patient's noncompliance with other medical treatment and regimen due to unspecified reason: Secondary | ICD-10-CM

## 2024-02-17 ENCOUNTER — Ambulatory Visit (HOSPITAL_BASED_OUTPATIENT_CLINIC_OR_DEPARTMENT_OTHER): Admitting: Pain Medicine

## 2024-02-17 DIAGNOSIS — Z09 Encounter for follow-up examination after completed treatment for conditions other than malignant neoplasm: Secondary | ICD-10-CM

## 2024-02-17 DIAGNOSIS — Z91199 Patient's noncompliance with other medical treatment and regimen due to unspecified reason: Secondary | ICD-10-CM

## 2024-02-17 DIAGNOSIS — G8929 Other chronic pain: Secondary | ICD-10-CM

## 2024-02-17 NOTE — Progress Notes (Signed)
 Department: [place] [facility] at [facility]) Date: 02/17/2024  Event: Canceled/Rescheduled by patient..  Encounter Type: (PPE) Post-procedure evaluation.          Advance notice: Less than 24 hr notice.  Reason: Sickness..          Note: Patient will R/S

## 2024-02-18 ENCOUNTER — Encounter: Admitting: Orthopaedic Surgery

## 2024-02-29 NOTE — Progress Notes (Unsigned)
 PROVIDER NOTE: Interpretation of information contained herein should be left to medically-trained personnel. Specific Danielle [name] instructions are provided elsewhere under Danielle [name] Instructions section of medical record. This document was created in part using AI and STT-dictation technology, any transcriptional errors that may result from this process are unintentional.  Danielle [name]: Danielle [name]  Service: E/M   PCP: Alyse Bradley, MD  DOB: 11/24/1960  DOS: 03/01/2024  Provider: Eric DELENA Como, MD  MRN: [id number]  Delivery: Face-to-face  Specialty: Interventional Pain Management  Type: Established Danielle [name]  Setting: Ambulatory outpatient facility  Specialty designation: 09  Referring Prov.: Alyse Bradley, MD  Location: Outpatient office facility       History of present illness (HPI) Danielle [name], a 63 y.o. year old female, is here today because of her Chronic bilateral low back pain without sciatica [M54.50, G89.29]. Danielle [name] primary complain today is No chief complaint on file.  Pertinent problems: Danielle [name] has Cervical spinal cord compression [facility]) (April, 2017); Cervical spinal stenosis; Chronic shoulder pain (1ry area of Pain) (Bilateral) (R>L); Chronic low back pain (3ry area of Pain) (Bilateral) (R>L) w/ sciatica (Right); Lumbar facet syndrome (Bilateral) (R>L); Lumbar spondylosis; Failed back surgical syndrome; Chronic lower extremity pain (Right); Chronic neck pain (Bilateral) (R>L); Chronic cervical radicular pain (Right); Ulnar neuropathy (Left); Hx of cervical spine surgery; Cervical spondylosis; Fibromyalgia; Chronic sacroiliac pain (Bilateral) (L>R); History of total arthroplasty of shoulder (Left); Chronic pain syndrome; Complaints of weakness of lower extremity; Anterolisthesis of lumbar spine (L4/L5); Cervical facet syndrome (HCC); Chronic musculoskeletal pain; Muscle spasm of back; Myofascial pain; Trigger point with back pain; Osteoarthritis of shoulder (Bilateral);  Spondylosis without myelopathy or radiculopathy, lumbosacral region; Cellulitis of leg, right; Chronic trochanteric bursitis (Bilateral); Osteoarthritis involving multiple joints; Chronic shoulder pain after replacement; History of total shoulder replacement (Right); History of total replacement of shoulder joints (Bilateral); Leg edema, left; Spondylosis without myelopathy or radiculopathy, cervical region; Chronic hip pain (Right); Unilateral primary osteoarthritis, right hip; Chronic sacroiliac joint pain (Left); Other spondylosis, sacral and sacrococcygeal region; Sacroiliac joint dysfunction (Left); Somatic dysfunction of sacroiliac joint (Left); Enthesopathy of sacroiliac joint (Left); DDD (degenerative disc disease), cervical; DDD (degenerative disc disease), lumbosacral; Other intervertebral disc degeneration, lumbar region; Acetabular labrum tear, sequela (Left); Acetabular labrum tear, sequela (Right); Epidural fibrosis (Left: L5-S1); Chronic groin pain (Bilateral); Abnormal MRI, lumbar spine (09/28/2020); Abnormal MRI, hip (Bilateral) (10/04/2020); Chronic shoulder pain (Left); Cervicalgia; Chronic neck pain with history of cervical spinal surgery; Chronic hip pain (Left); Chronic groin pain (Left); Abnormal drug screen (11/21/2021); Chronic sacroiliac joint pain (Right); Chronic pain disorder; Arthralgia of hip; Trochanteric bursitis of hip (Right); Left thigh pain; Right buttock pain; Somatic dysfunction of sacroiliac joint (Right); Sacroiliac joint dysfunction (Right); Chronic low back pain (Bilateral) (R>L) w/o sciatica; Lumbar facet joint pain; Osteoarthritis of facet joint of lumbar spine; Abnormal CT scan, cervical spine (07/18/2021); Osteoarthritis of hip (Left); Status post total replacement of hip (Right); and History of total hip replacement (Right) on their pertinent problem list.  Pain Assessment: Severity of   is reported as a  /10. Location:    / . Onset:  . Quality:  . Timing:  .  Modifying factor(s):  SABRA Vitals:  vitals were not taken for this visit.  BMI: Estimated body mass index is 37.61 kg/m as calculated from the following:   Height as of 01/15/24: 5' 5 (1.651 m).   Weight as of 01/15/24: 226 lb (102.5 kg).  Last encounter: 02/17/2024. Last procedure: 01/15/2024.  Reason for encounter:  post-procedure evaluation and assessment.   Discussed the use of AI scribe software for clinical note transcription with the Danielle [name], who gave verbal consent to proceed.  History of Present Illness          Post-Procedure Evaluation   Type: Lumbar Facet, Medial Branch Block(s)            Laterality: Bilateral  Level: L2, L3, L4, L5, and S1 Medial Branch/Dorsal Rami Level(s). Injecting these levels blocks the L3-4, L4-5, and L5-S1 lumbar facet joints. Imaging: Fluoroscopic guidance Spinal ([id number]) Anesthesia: Local anesthesia (1-2% Lidocaine ) Anxiolysis: IV Versed  3.0 mg Sedation: Moderate Sedation Fentanyl  2.0 mL (100 mcg) DOS: 01/15/2024 Performed by: Eric DELENA Como, MD  Primary Purpose: Diagnostic/Therapeutic Indications: Low back pain severe enough to impact quality of life or function. 1. Chronic low back pain (Bilateral) (R>L) w/o sciatica   2. Lumbar facet joint pain   3. Lumbar facet syndrome (Bilateral) (R>L)   4. Spondylosis without myelopathy or radiculopathy, lumbosacral region   5. Failed back surgical syndrome   6. Abnormal MRI, lumbar spine (09/28/2020)   7. Status post total replacement of hip (Right)   8. History of allergy to iodine & radiographic dye   9. History of drug-induced anaphylaxis (Contrast Dye)   10. History of multiple allergies    NAS-11 Pain score:   Pre-procedure: 4 /10   Post-procedure: 0-No pain/10     Effectiveness:  Initial hour after procedure:   ***. Subsequent 4-6 hours post-procedure:   ***. Analgesia past initial 6 hours:   ***. Ongoing improvement:  Analgesic:  *** Function:    ***    ROM:    ***      Pharmacotherapy Assessment   Opioid Analgesic(s): Analgesic: No chronic opioid analgesics therapy prescribed by our practice.  Discontinued (11/21/2021) UDS. (+) COCAINE.  Monitoring: [place] PMP: PDMP reviewed during this encounter.       Pharmacotherapy: No side-effects or adverse reactions reported. Compliance: No problems identified. Effectiveness: Clinically acceptable.  No notes on file  UDS:  Summary  Date Value Ref Range Status  11/21/2021 Note  Final    Comment:    ==================================================================== [employer] Select 13 (MW) ==================================================================== Test                             Result       Flag       Units  Drug Present and Declared for Prescription Verification   Oxycodone                       >2433        EXPECTED   ng/mg creat   Oxymorphone                    164          EXPECTED   ng/mg creat   Noroxycodone                   >2433        EXPECTED   ng/mg creat   Noroxymorphone                 58           EXPECTED   ng/mg creat    Sources of oxycodone  are scheduled prescription medications.    Oxymorphone, noroxycodone, and noroxymorphone are expected    metabolites of oxycodone . Oxymorphone is also available as a  scheduled prescription medication.  Drug Present not Declared for Prescription Verification   Methamphetamine                12           UNEXPECTED ng/mg creat    Sources of methamphetamine include illicit sources, as a scheduled    prescription medication, as a metabolite of some prescription drugs,    or use of an l-methamphetamine inhaler.    Oxazepam                       17           UNEXPECTED ng/mg creat    Oxazepam may be administered as a scheduled prescription medication;    it is also an expected metabolite of other benzodiazepine drugs,    including diazepam , chlordiazepoxide, prazepam, clorazepate,    halazepam, and temazepam.    Benzoylecgonine                 38           UNEXPECTED ng/mg creat    Benzoylecgonine is a metabolite of cocaine; its presence indicates    use of this drug.  Source is most commonly illicit, but cocaine is    present in some topical anesthetic solutions.  ==================================================================== Test                      Result    Flag   Units      Ref Range   Creatinine              411              mg/dL      >=79 ==================================================================== Declared Medications:  The flagging and interpretation on this report are based on the  following declared medications.  Unexpected results may arise from  inaccuracies in the declared medications.   **Note: The testing scope of this panel includes these medications:   Oxycodone    **Note: The testing scope of this panel does not include the  following reported medications:   Albuterol   Azelastine  Cetirizine  Citalopram  (Celexa )  Diclofenac  (Voltaren )  Docusate (Colace)  Fluticasone  (Trelegy)  Guaifenesin  (Mucinex )  Levothyroxine  (Synthroid )  Naloxone   Nystatin  Supplement  Tizanidine  (Zanaflex )  Umeclidinium (Trelegy)  Vilanterol (Trelegy)  Vitamin D  ==================================================================== For clinical consultation, please call [phone]. ====================================================================     No results found for: CBDTHCR No results found for: D8THCCBX No results found for: D9THCCBX  ROS  Constitutional: Denies any fever or chills Gastrointestinal: No reported hemesis, hematochezia, vomiting, or acute GI distress Musculoskeletal: Denies any acute onset joint swelling, redness, loss of ROM, or weakness Neurological: No reported episodes of acute onset apraxia, aphasia, dysarthria, agnosia, amnesia, paralysis, loss of coordination, or loss of consciousness  Medication Review  Menthol  (Topical Analgesic), Vitamin D3,  albuterol , aspirin , budesonide-formoterol, cetirizine, citalopram , cyclobenzaprine , dextromethorphan-guaiFENesin , docusate sodium , fluticasone , ipratropium-albuterol , levothyroxine , losartan , oxyCODONE , and tiotropium  History Review  Allergy: Danielle [name] is allergic to aripiprazole, duloxetine, gabapentin, gold, iodinated contrast media, naproxen sodium, nortriptyline hcl, nsaids, pregabalin, acetaminophen , meperidine, ace inhibitors, aspirin , cefpodoxime, fluoxetine, fluticasone -salmeterol, levofloxacin, lithium, paroxetine, tape, telithromycin, theophylline, topiramate, trazodone, triamcinolone , venlafaxine, bupropion, ketorolac  tromethamine , morphine, and moxifloxacin. Drug: Danielle [name]  reports no history of drug use. Alcohol:  reports that she does not currently use alcohol. Tobacco:  reports that she has been smoking cigarettes. She has a 80 pack-year smoking history. She has never  used smokeless tobacco. Social: Danielle [name]  reports that she has been smoking cigarettes. She has a 80 pack-year smoking history. She has never used smokeless tobacco. She reports that she does not currently use alcohol. She reports that she does not use drugs. Medical:  has a past medical history of Acid reflux, Anxiety, Arthritis, Asthma, Bell's palsy, Bursitis, Complication of anesthesia, COPD (chronic obstructive pulmonary disease) (HCC), Depression, Fibromyalgia, Heart murmur, Hepatitis C, Hiatal hernia, Hyperlipidemia, Hypertension, Hypothyroidism, IBS (irritable bowel syndrome), Insomnia, Osteoarthritis, and Post-nasal drip. Surgical: Danielle [name]  has a past surgical history that includes necksurgery (10/21/2014); Back surgery (2013); Foot surgery (Right); Elbow surgery (Left); Carpal tunnel release (Right); Nose surgery; Partial hysterectomy; Total shoulder replacement (Bilateral); Colonoscopy with propofol  (N/A, 02/05/2017); and Total hip arthroplasty (Right, 11/11/2023). Family: family history includes Cancer in  her mother; Gout in her mother; Heart disease in her father.  Laboratory Chemistry Profile   Renal Lab Results  Component Value Date   BUN 18 11/12/2023   CREATININE 1.73 (H) 11/12/2023   BCR 13 02/16/2019   GFRAA >60 04/28/2019   GFRNONAA 33 (L) 11/12/2023    Hepatic Lab Results  Component Value Date   AST 19 10/31/2023   ALT 17 10/31/2023   ALBUMIN  3.5 10/31/2023   ALKPHOS 54 10/31/2023    Electrolytes Lab Results  Component Value Date   NA 131 (L) 11/12/2023   K 4.5 11/12/2023   CL 100 11/12/2023   CALCIUM 8.3 (L) 11/12/2023   MG 2.1 02/16/2019    Bone Lab Results  Component Value Date   25OHVITD1 27 (L) 02/16/2019   25OHVITD2 2.0 02/16/2019   25OHVITD3 25 02/16/2019    Inflammation (CRP: Acute Phase) (ESR: Chronic Phase) Lab Results  Component Value Date   CRP 2 02/16/2019   ESRSEDRATE 23 02/16/2019         Note: Above Lab results reviewed.  Recent Imaging Review  DG PAIN CLINIC C-ARM 1-60 MIN NO REPORT Fluoro was used, but no Radiologist interpretation will be provided.  Please refer to NOTES tab for provider progress note. Note: Reviewed        Physical Exam  Vitals: There were no vitals taken for this visit. BMI: Estimated body mass index is 37.61 kg/m as calculated from the following:   Height as of 01/15/24: 5' 5 (1.651 m).   Weight as of 01/15/24: 226 lb (102.5 kg). Ideal: Danielle [name] weight not recorded General appearance: Well nourished, well developed, and well hydrated. In no apparent acute distress Mental status: Alert, oriented x 3 (person, place, & time)       Respiratory: No evidence of acute respiratory distress Eyes: PERLA   Assessment   Diagnosis Status  1. Chronic low back pain (Bilateral) (R>L) w/o sciatica   2. Lumbar facet joint pain   3. Lumbar facet syndrome (Bilateral) (R>L)   4. Osteoarthritis of facet joint of lumbar spine   5. Spondylosis without myelopathy or radiculopathy, lumbosacral region   6. Postop check     Controlled Controlled Controlled   Updated Problems: No problems updated.  Plan of Care  Problem-specific:  Assessment and Plan            Danielle [name] has a current medication list which includes the following long-term medication(s): albuterol , citalopram , fluticasone , ipratropium-albuterol , levothyroxine , losartan , and symbicort.  Pharmacotherapy (Medications Ordered): No orders of the defined types were placed in this encounter.  Orders:  No orders of the defined types were placed in this encounter.  Interventional Therapies  Risk Factors  Considerations:   Allergy (Anaphylactic-type): Contrast dye.  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 (+) COCAINE.   Planned  Pending:   Therapeutic bilateral lumbar facet MBB #6 (antibiotics required due to recent right total hip replacement)   Under consideration:   Therapeutic left IA hip injection + TBI #3  Therapeutic bilateral lumbar facet RFA #3    Completed:   Palliative left trochanteric bursa x2 (09/18/2021) (100/100/90)  Palliative right trochanteric bursa x4 (09/17/2022) (100/100/90/70)  Palliative right suprascapular NB x3 (08/08/2020) (50/50/100/90-100)   Palliative left suprascapular NB x4 (02/22/2021) (100/100/90/90)  Palliative bilateral suprascapular nerve RFA x1 (05/13/2019) (100/100/90/90-100)  Therapeutic right IA hip joint injection x6 (09/17/2022) (100/100/70/70)  Therapeutic left IA hip joint injection x4 (09/18/2021) (100/100/90)  Diagnostic bilateral lumbar facet MBB x5 (03/11/2023) (100/90/70/60)  Palliative right lumbar facet RFA x2 (12/16/2017) (100/100/85)  Palliative left lumbar facet RFA x2 (02/12/2018) [facility])  Diagnostic right cervical facet MBB x2 (04/11/2022) (100/100/100/100)  Diagnostic left cervical facet MBB x1 (09/30/2016) (90/100/0)  Diagnostic right SI  joint block x1 (06/12/2017) (DNKFU -0/0/80)    Therapeutic  Palliative (PRN) options:  Therapeutic suprascapular nerve RFA  Therapeutic lumbar facet RFA    Pharmacotherapy  Nonopioids transferred 05/15/2020: Zanaflex  and Voltaren  gel      No follow-ups on file.    Recent Visits Date Type Provider Dept  01/15/24 Procedure visit Tanya Glisson, MD [facility]  01/05/24 Office Visit Tanya Glisson, MD [facility]  Showing recent visits within past 90 days and meeting all other requirements Future Appointments Date Type Provider Dept  03/01/24 Appointment Tanya Glisson, MD [facility]  Showing future appointments within next 90 days and meeting all other requirements  I discussed the assessment and treatment plan with the Danielle [name]. The Danielle [name] was provided an opportunity to ask questions and all were answered. The Danielle [name] agreed with the plan and demonstrated an understanding of the instructions.  Danielle [name] advised to call back or seek an in-person evaluation if the symptoms or condition worsens.  Duration of encounter: *** minutes.  Total time on encounter, as per AMA guidelines included both the face-to-face and non-face-to-face time personally spent by the physician and/or other qualified health care professional(s) on the day of the encounter (includes time in activities that require the physician or other qualified health care professional and does not include time in activities normally performed by clinical staff). Physician's time may include the following activities when performed: Preparing to see the Danielle [name] (e.g., pre-charting review of records, searching for previously ordered imaging, lab work, and nerve conduction tests) Review of prior analgesic pharmacotherapies. Reviewing PMP Interpreting ordered tests (e.g., lab work, imaging, nerve conduction tests) Performing post-procedure evaluations, including interpretation of diagnostic  procedures Obtaining and/or reviewing separately obtained history Performing a medically appropriate examination and/or evaluation Counseling and educating the Danielle [name]/family/caregiver Ordering medications, tests, or procedures Referring and communicating with other health care professionals (when not separately reported) Documenting clinical information in the electronic or other health record Independently interpreting results (not separately reported) and communicating results to the Danielle [name]/ family/caregiver Care coordination (not separately reported)  Note by: Glisson DELENA Tanya, MD (TTS and AI technology used. I apologize for any typographical errors that were not detected and corrected.) Date: 03/01/2024; Time: 7:36 PM

## 2024-03-01 ENCOUNTER — Encounter: Payer: Self-pay | Admitting: Pain Medicine

## 2024-03-01 ENCOUNTER — Ambulatory Visit: Attending: Pain Medicine | Admitting: Pain Medicine

## 2024-03-01 VITALS — BP 113/74 | HR 84 | Temp 97.8°F | Resp 16 | Ht 65.0 in | Wt 230.0 lb

## 2024-03-01 DIAGNOSIS — M545 Low back pain, unspecified: Secondary | ICD-10-CM | POA: Diagnosis not present

## 2024-03-01 DIAGNOSIS — M5459 Other low back pain: Secondary | ICD-10-CM | POA: Insufficient documentation

## 2024-03-01 DIAGNOSIS — Z09 Encounter for follow-up examination after completed treatment for conditions other than malignant neoplasm: Secondary | ICD-10-CM | POA: Insufficient documentation

## 2024-03-01 DIAGNOSIS — G8929 Other chronic pain: Secondary | ICD-10-CM | POA: Diagnosis present

## 2024-03-01 DIAGNOSIS — M47816 Spondylosis without myelopathy or radiculopathy, lumbar region: Secondary | ICD-10-CM | POA: Diagnosis present

## 2024-03-01 DIAGNOSIS — M47817 Spondylosis without myelopathy or radiculopathy, lumbosacral region: Secondary | ICD-10-CM | POA: Diagnosis present

## 2024-03-01 DIAGNOSIS — M25531 Pain in right wrist: Secondary | ICD-10-CM | POA: Diagnosis present

## 2024-03-01 NOTE — Progress Notes (Signed)
 Safety precautions to be maintained throughout the outpatient stay will include: orient to surroundings, keep bed in low position, maintain call bell within reach at all times, provide assistance with transfer out of bed and ambulation.

## 2024-03-01 NOTE — Patient Instructions (Signed)

## 2024-03-17 ENCOUNTER — Encounter: Payer: Self-pay | Admitting: Orthopaedic Surgery

## 2024-03-17 ENCOUNTER — Ambulatory Visit (INDEPENDENT_AMBULATORY_CARE_PROVIDER_SITE_OTHER): Admitting: Orthopaedic Surgery

## 2024-03-17 DIAGNOSIS — Z96641 Presence of right artificial hip joint: Secondary | ICD-10-CM

## 2024-03-17 NOTE — Progress Notes (Signed)
 The patient is a 63 year old female who is now 4 months status post a right total hip arthroplasty.  She reports some thigh pain and an area of the hip that she wants me to take a look at given that she has a area of swelling and firmness.  On exam her incision looks good.  I do feel an area of soft tissue that feels more like scar tissue and may be retained suture deep.  There was no fluid collection to aspirate and I tried to aspirate any fluid.  This seems more like scar and either hematoma or even lipoma.  I gave her reassurance that this does not appear worrisome.  Some of the other pain that she experiencing in her thigh is appropriate after hip replacement surgery as the implants are growing in the bone.  She is walking with a more normal gait.  Will see her back in 3 months and at that visit we will have a standing AP pelvis and lateral of her right operative hip.  If there is issues before then she will reach out to us .

## 2024-04-13 ENCOUNTER — Ambulatory Visit: Admitting: Pain Medicine

## 2024-04-29 ENCOUNTER — Ambulatory Visit
Admission: RE | Admit: 2024-04-29 | Discharge: 2024-04-29 | Disposition: A | Discharge status: Home / Self Care | Source: Ambulatory Visit | Attending: Pain Medicine | Admitting: Pain Medicine

## 2024-04-29 ENCOUNTER — Ambulatory Visit (HOSPITAL_BASED_OUTPATIENT_CLINIC_OR_DEPARTMENT_OTHER): Admitting: Pain Medicine

## 2024-04-29 ENCOUNTER — Encounter: Payer: Self-pay | Admitting: Pain Medicine

## 2024-04-29 VITALS — BP 109/68 | HR 84 | Temp 97.1°F | Resp 16 | Ht 65.0 in | Wt 234.0 lb

## 2024-04-29 DIAGNOSIS — G8929 Other chronic pain: Secondary | ICD-10-CM | POA: Insufficient documentation

## 2024-04-29 DIAGNOSIS — M47816 Spondylosis without myelopathy or radiculopathy, lumbar region: Secondary | ICD-10-CM | POA: Insufficient documentation

## 2024-04-29 DIAGNOSIS — R1031 Right lower quadrant pain: Secondary | ICD-10-CM | POA: Insufficient documentation

## 2024-04-29 DIAGNOSIS — Z889 Allergy status to unspecified drugs, medicaments and biological substances status: Secondary | ICD-10-CM | POA: Insufficient documentation

## 2024-04-29 DIAGNOSIS — Z91041 Radiographic dye allergy status: Secondary | ICD-10-CM | POA: Insufficient documentation

## 2024-04-29 DIAGNOSIS — Z96641 Presence of right artificial hip joint: Secondary | ICD-10-CM | POA: Diagnosis present

## 2024-04-29 DIAGNOSIS — Z6841 Body Mass Index (BMI) 40.0 and over, adult: Secondary | ICD-10-CM | POA: Diagnosis present

## 2024-04-29 DIAGNOSIS — M25551 Pain in right hip: Secondary | ICD-10-CM | POA: Diagnosis present

## 2024-04-29 DIAGNOSIS — M25531 Pain in right wrist: Secondary | ICD-10-CM | POA: Insufficient documentation

## 2024-04-29 DIAGNOSIS — R1032 Left lower quadrant pain: Secondary | ICD-10-CM | POA: Insufficient documentation

## 2024-04-29 DIAGNOSIS — M1811 Unilateral primary osteoarthritis of first carpometacarpal joint, right hand: Secondary | ICD-10-CM | POA: Insufficient documentation

## 2024-04-29 DIAGNOSIS — M47817 Spondylosis without myelopathy or radiculopathy, lumbosacral region: Secondary | ICD-10-CM | POA: Insufficient documentation

## 2024-04-29 DIAGNOSIS — M5459 Other low back pain: Secondary | ICD-10-CM | POA: Insufficient documentation

## 2024-04-29 DIAGNOSIS — Z8619 Personal history of other infectious and parasitic diseases: Secondary | ICD-10-CM | POA: Insufficient documentation

## 2024-04-29 DIAGNOSIS — M961 Postlaminectomy syndrome, not elsewhere classified: Secondary | ICD-10-CM | POA: Diagnosis present

## 2024-04-29 DIAGNOSIS — M545 Low back pain, unspecified: Secondary | ICD-10-CM

## 2024-04-29 DIAGNOSIS — G8918 Other acute postprocedural pain: Secondary | ICD-10-CM | POA: Insufficient documentation

## 2024-04-29 MED ORDER — ROPIVACAINE HCL 2 MG/ML IJ SOLN
9.0000 mL | Freq: Once | INTRAMUSCULAR | Status: AC
  Administered 2024-04-29: 9 mL via PERINEURAL

## 2024-04-29 MED ORDER — TRIAMCINOLONE ACETONIDE 40 MG/ML IJ SUSP
40.0000 mg | Freq: Once | INTRAMUSCULAR | Status: AC
  Administered 2024-04-29: 40 mg

## 2024-04-29 MED ORDER — ROPIVACAINE HCL 2 MG/ML IJ SOLN
1.0000 mL | Freq: Once | INTRAMUSCULAR | Status: AC
  Administered 2024-04-29: 1 mL via INTRA_ARTICULAR

## 2024-04-29 MED ORDER — MIDAZOLAM HCL 5 MG/5ML IJ SOLN
INTRAMUSCULAR | Status: AC
  Filled 2024-04-29: qty 5

## 2024-04-29 MED ORDER — TRIAMCINOLONE ACETONIDE 40 MG/ML IJ SUSP
INTRAMUSCULAR | Status: AC
  Filled 2024-04-29: qty 1

## 2024-04-29 MED ORDER — MIDAZOLAM HCL 5 MG/5ML IJ SOLN
0.5000 mg | Freq: Once | INTRAMUSCULAR | Status: AC
  Administered 2024-04-29: 3 mg via INTRAVENOUS

## 2024-04-29 MED ORDER — HYDROCODONE-ACETAMINOPHEN 5-325 MG PO TABS: 1.0000 | ORAL_TABLET | Freq: Four times a day (QID) | ORAL | 0 refills | Status: DC | PRN

## 2024-04-29 MED ORDER — LIDOCAINE HCL 2 % IJ SOLN
20.0000 mL | Freq: Once | INTRAMUSCULAR | Status: AC
  Administered 2024-04-29: 400 mg

## 2024-04-29 MED ORDER — CEFAZOLIN SODIUM-DEXTROSE 1-4 GM/50ML-% IV SOLN
1.0000 g | Freq: Once | INTRAVENOUS | Status: AC
  Administered 2024-04-29: 1 g via INTRAVENOUS

## 2024-04-29 MED ORDER — METHYLPREDNISOLONE ACETATE 80 MG/ML IJ SUSP
80.0000 mg | Freq: Once | INTRAMUSCULAR | Status: AC
  Administered 2024-04-29: 80 mg via INTRA_ARTICULAR

## 2024-04-29 MED ORDER — FENTANYL CITRATE (PF) 100 MCG/2ML IJ SOLN
INTRAMUSCULAR | Status: AC
  Filled 2024-04-29: qty 2

## 2024-04-29 MED ORDER — OXYCODONE HCL 5 MG PO TABS: 5.0000 mg | ORAL_TABLET | Freq: Four times a day (QID) | ORAL | 0 refills | Status: DC | PRN

## 2024-04-29 MED ORDER — LIDOCAINE HCL 2 % IJ SOLN
INTRAMUSCULAR | Status: AC
  Filled 2024-04-29: qty 20

## 2024-04-29 MED ORDER — ROPIVACAINE HCL 2 MG/ML IJ SOLN
INTRAMUSCULAR | Status: AC
  Filled 2024-04-29: qty 20

## 2024-04-29 MED ORDER — CEFAZOLIN SODIUM 1 G IJ SOLR
INTRAMUSCULAR | Status: AC
Start: 2024-04-29 — End: 2024-04-29
  Filled 2024-04-29: qty 10

## 2024-04-29 MED ORDER — FLUCONAZOLE 150 MG PO TABS: 150.0000 mg | ORAL_TABLET | Freq: Once | ORAL | 0 refills | Status: DC | PRN

## 2024-04-29 MED ORDER — PENTAFLUOROPROP-TETRAFLUOROETH EX AERO
INHALATION_SPRAY | Freq: Once | CUTANEOUS | Status: AC
  Administered 2024-04-29: 30 via TOPICAL

## 2024-04-29 MED ORDER — METHYLPREDNISOLONE ACETATE 80 MG/ML IJ SUSP
INTRAMUSCULAR | Status: AC
  Filled 2024-04-29: qty 1

## 2024-04-29 MED ORDER — FENTANYL CITRATE (PF) 100 MCG/2ML IJ SOLN
25.0000 ug | INTRAMUSCULAR | Status: DC | PRN
  Administered 2024-04-29: 100 ug via INTRAVENOUS

## 2024-04-29 NOTE — Progress Notes (Signed)
 PROVIDER NOTE: Interpretation of information contained herein should be left to medically-trained personnel. Specific patient instructions are provided elsewhere under Patient Instructions section of medical record. This document was created in part using STT-dictation technology, any transcriptional errors that may result from this process are unintentional.  Patient: Danielle [name] Type: Established DOB: 01-Jul-1961 MRN: [id number] PCP: Alyse Bradley, MD  Service: Procedure DOS: 04/29/2024 Setting: Ambulatory Location: Ambulatory outpatient facility Delivery: Face-to-face Provider: Eric DELENA Como, MD Specialty: Interventional Pain Management Specialty designation: 09 Location: Outpatient facility Ref. Prov.: Como Eric, MD       Interventional Therapy   Procedure: Lumbar Facet, Medial Branch Radiofrequency Ablation (RFA) #3  Laterality: Right (-RT)  Level: L2, L3, L4, L5, and S1 Medial Branch Level(s). These levels will denervate the L3-4, L4-5, and L5-S1 lumbar facet joints.  Imaging: Fluoroscopy-guided Spinal ([id number]) Anesthesia: Local anesthesia (1-2% Lidocaine ) Anxiolysis: IV Versed  3.0 mg Sedation: Minimal Sedation Fentanyl  2.0 mL (100 mcg) DOS: 04/29/2024  Performed by: Eric DELENA Como, MD  Purpose: Therapeutic/Palliative Indications: Low back pain severe enough to impact quality of life or function. Indications: 1. Chronic low back pain (Bilateral) (R>L) w/o sciatica   2. Chronic hip pain (Right)   3. Chronic groin pain (Bilateral)   4. Lumbar facet joint pain   5. Lumbar facet syndrome (Bilateral) (R>L)   6. Osteoarthritis of facet joint of lumbar spine   7. Spondylosis without myelopathy or radiculopathy, lumbosacral region   8. Failed back surgical syndrome    History of multiple allergies    History of allergy to iodine & radiographic dye    Morbid obesity with BMI of 40.0-44.9, adult (HCC)    Ms. Isabell has been dealing with the above  chronic pain for longer than three months and has either failed to respond, was unable to tolerate, or simply did not get enough benefit from other more conservative therapies including, but not limited to: 1. Over-the-counter medications 2. Anti-inflammatory medications 3. Muscle relaxants 4. Membrane stabilizers 5. Opioids 6. Physical therapy and/or chiropractic manipulation 7. Modalities (Heat, ice, etc.) 8. Invasive techniques such as nerve blocks. Ms. Barba has attained more than 50% relief of the pain from a series of diagnostic injections conducted in separate occasions.  Procedure #2:    Type: Diagnostic Wrist Steroid Injection  #1  Region: Dorsal Lunate area Level: Wrist Laterality: Right     1. Chronic wrist pain (Right)   2. Arthritis of carpometacarpal (CMC) joint of thumb (Right)     Pain Score: Pre-procedure: 3 /10 Post-procedure: 4 /10     Position / Prep / Materials:  Position: Prone  Prep solution: ChloraPrep (2% chlorhexidine  gluconate and 70% isopropyl alcohol) Prep Area: Entire Lumbosacral Region (Lower back from mid-thoracic region to end of tailbone and from flank to flank.) Materials:  Tray: RFA (Radiofrequency) tray Needle(s):  Type: RFA (Teflon-coated radiofrequency ablation needles) Gauge (G): 20  Length: Long (15cm) Qty: 5     Materials (Procedure #2):  Needle(s) Type: Regular needle Gauge: 25G Length: 1.5-in Medication(s): Please see orders for medications and dosing details.  H&P (Pre-op Assessment):  Ms. Stiggers is a 63 y.o. (year old), female patient, seen today for interventional treatment. 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 Ms. Bensch has a  current medication list which includes the  following prescription(s): albuterol , aspirin , cetirizine, vitamin d3, citalopram , cyclobenzaprine , dextromethorphan-guaifenesin , docusate sodium , fluticasone , ipratropium-albuterol , levothyroxine , losartan , menthol  (topical analgesic), oxycodone , [START ON 05/06/2024] oxycodone , spiriva  handihaler, and symbicort, and the following Facility-Administered Medications: fentanyl . Her primarily concern today is the Arm Pain (Right wrist)  Initial Vital Signs:  Pulse/HCG Rate: 84ECG Heart Rate: 79 Temp: (!) 97.4 F (36.3 C) Resp: (!) 8 BP: (!) 146/84 SpO2: 98 %  BMI: Estimated body mass index is 38.94 kg/m as calculated from the following:   Height as of this encounter: 5' 5 (1.651 m).   Weight as of this encounter: 234 lb (106.1 kg).  Risk Assessment: Allergies: Reviewed. She is allergic to aripiprazole, duloxetine, gabapentin, gold, iodinated contrast media, naproxen sodium, nortriptyline hcl, nsaids, pregabalin, acetaminophen , meperidine, ace inhibitors, aspirin , cefpodoxime, fluoxetine, fluticasone -salmeterol, levofloxacin, lithium, paroxetine, tape, telithromycin, theophylline, topiramate, trazodone, triamcinolone , venlafaxine, bupropion, ketorolac  tromethamine , morphine, and moxifloxacin.  Allergy Precautions: None required Coagulopathies: Reviewed. None identified.  Blood-thinner therapy: None at this time Active Infection(s): Reviewed. None identified. Ms. Fennimore is afebrile  Site Confirmation: Ms. Morais was asked to confirm the procedure and laterality before marking the site Procedure checklist: Completed Consent: Before the procedure and under the influence of no sedative(s), amnesic(s), or anxiolytics, the patient was informed of the treatment options, risks and possible complications. To fulfill our ethical and legal obligations, as recommended by the American Medical Association's Code of Ethics, I have informed the patient of my clinical  impression; the nature and purpose of the treatment or procedure; the risks, benefits, and possible complications of the intervention; the alternatives, including doing nothing; the risk(s) and benefit(s) of the alternative treatment(s) or procedure(s); and the risk(s) and benefit(s) of doing nothing. The patient was provided information about the general risks and possible complications associated with the procedure. These may include, but are not limited to: failure to achieve desired goals, infection, bleeding, organ or nerve damage, allergic reactions, paralysis, and death. In addition, the patient was informed of those risks and complications associated to Spine-related procedures, such as failure to decrease pain; infection (i.e.: Meningitis, epidural or intraspinal abscess); bleeding (i.e.: epidural hematoma, subarachnoid hemorrhage, or any other type of intraspinal or peri-dural bleeding); organ or nerve damage (i.e.: Any type of peripheral nerve, nerve root, or spinal cord injury) with subsequent damage to sensory, motor, and/or autonomic systems, resulting in permanent pain, numbness, and/or weakness of one or several areas of the body; allergic reactions; (i.e.: anaphylactic reaction); and/or death. Furthermore, the patient was informed of those risks and complications associated with the medications. These include, but are not limited to: allergic reactions (i.e.: anaphylactic or anaphylactoid reaction(s)); adrenal axis suppression; blood sugar elevation that in diabetics may result in ketoacidosis or comma; water retention that in patients with history of congestive heart failure may result in shortness of breath, pulmonary edema, and decompensation with resultant heart failure; weight gain; swelling or edema; medication-induced neural toxicity; particulate matter embolism and blood vessel occlusion with resultant organ, and/or nervous system infarction; and/or aseptic necrosis of one or more  joints. Finally, the patient was informed that Medicine is not an exact science; therefore, there is also the possibility of unforeseen or unpredictable risks and/or possible complications that may result in a catastrophic outcome. The patient indicated having understood very clearly. We have given the patient no guarantees and we have made no promises. Enough time was given to the patient to ask questions, all of which were answered to the patient's satisfaction. Ms. Bonus has indicated that she wanted to  continue with the procedure. Attestation: I, the ordering provider, attest that I have discussed with the patient the benefits, risks, side-effects, alternatives, likelihood of achieving goals, and potential problems during recovery for the procedure that I have provided informed consent. Date  Time: 04/29/2024  8:32 AM  Pre-Procedure Preparation:  Monitoring: As per clinic protocol. Respiration, ETCO2, SpO2, BP, heart rate and rhythm monitor placed and checked for adequate function Safety Precautions: Patient was assessed for positional comfort and pressure points before starting the procedure. Time-out: I initiated and conducted the Time-out before starting the procedure, as per protocol. The patient was asked to participate by confirming the accuracy of the Time Out information. Verification of the correct person, site, and procedure were performed and confirmed by me, the nursing staff, and the patient. Time-out conducted as per Joint Commission's Universal Protocol (UP.01.01.01). Time: 0935 Start Time: 0935 hrs.  Description of Procedure:          Laterality: See above. Levels:  See above. Safety Precautions: Aspiration looking for blood return was conducted prior to all injections. At no point did we inject any substances, as a needle was being advanced. Before injecting, the patient was told to immediately notify me if she was experiencing any new onset of ringing in the ears, or  metallic taste in the mouth. No attempts were made at seeking any paresthesias. Safe injection practices and needle disposal techniques used. Medications properly checked for expiration dates. SDV (single dose vial) medications used. After the completion of the procedure, all disposable equipment used was discarded in the proper designated medical waste containers. Local Anesthesia: Protocol guidelines were followed. The patient was positioned over the fluoroscopy table. The area was prepped in the usual manner. The time-out was completed. The target area was identified using fluoroscopy. A 12-in long, straight, sterile hemostat was used with fluoroscopic guidance to locate the targets for each level blocked. Once located, the skin was marked with an approved surgical skin marker. Once all sites were marked, the skin (epidermis, dermis, and hypodermis), as well as deeper tissues (fat, connective tissue and muscle) were infiltrated with a small amount of a short-acting local anesthetic, loaded on a 10cc syringe with a 25G, 1.5-in  Needle. An appropriate amount of time was allowed for local anesthetics to take effect before proceeding to the next step. Technical description of process:  Radiofrequency Ablation (RFA) L2 Medial Branch Nerve RFA: The target area for the L2 medial branch is at the junction of the postero-lateral aspect of the superior articular process and the superior, posterior, and medial edge of the transverse process of L3. Under fluoroscopic guidance, a Radiofrequency needle was inserted until contact was made with os over the superior postero-lateral aspect of the pedicular shadow (target area). Sensory and motor testing was conducted to properly adjust the position of the needle. Once satisfactory placement of the needle was achieved, the numbing solution was slowly injected after negative aspiration for blood. 2.0 mL of the nerve block solution was injected without difficulty or  complication. After waiting for at least 3 minutes, the ablation was performed. Once completed, the needle was removed intact. L3 Medial Branch Nerve RFA: The target area for the L3 medial branch is at the junction of the postero-lateral aspect of the superior articular process and the superior, posterior, and medial edge of the transverse process of L4. Under fluoroscopic guidance, a Radiofrequency needle was inserted until contact was made with os over the superior postero-lateral aspect of the pedicular shadow (target area).  Sensory and motor testing was conducted to properly adjust the position of the needle. Once satisfactory placement of the needle was achieved, the numbing solution was slowly injected after negative aspiration for blood. 2.0 mL of the nerve block solution was injected without difficulty or complication. After waiting for at least 3 minutes, the ablation was performed. Once completed, the needle was removed intact. L4 Medial Branch Nerve RFA: The target area for the L4 medial branch is at the junction of the postero-lateral aspect of the superior articular process and the superior, posterior, and medial edge of the transverse process of L5. Under fluoroscopic guidance, a Radiofrequency needle was inserted until contact was made with os over the superior postero-lateral aspect of the pedicular shadow (target area). Sensory and motor testing was conducted to properly adjust the position of the needle. Once satisfactory placement of the needle was achieved, the numbing solution was slowly injected after negative aspiration for blood. 2.0 mL of the nerve block solution was injected without difficulty or complication. After waiting for at least 3 minutes, the ablation was performed. Once completed, the needle was removed intact. L5 Medial Branch Nerve RFA: The target area for the L5 medial branch is at the junction of the postero-lateral aspect of the superior articular process of S1 and the  superior, posterior, and medial edge of the sacral ala. Under fluoroscopic guidance, a Radiofrequency needle was inserted until contact was made with os over the superior postero-lateral aspect of the pedicular shadow (target area). Sensory and motor testing was conducted to properly adjust the position of the needle. Once satisfactory placement of the needle was achieved, the numbing solution was slowly injected after negative aspiration for blood. 2.0 mL of the nerve block solution was injected without difficulty or complication. After waiting for at least 3 minutes, the ablation was performed. Once completed, the needle was removed intact. S1 Medial Branch Nerve RFA: The target area for the S1 medial branch is located inferior to the junction of the S1 superior articular process and the L5 inferior articular process, posterior, inferior, and lateral to the 6 o'clock position of the L5-S1 facet joint, just superior to the S1 posterior foramen. Under fluoroscopic guidance, the Radiofrequency needle was advanced until contact was made with os over the Target area. Sensory and motor testing was conducted to properly adjust the position of the needle. Once satisfactory placement of the needle was achieved, the numbing solution was slowly injected after negative aspiration for blood. 2.0 mL of the nerve block solution was injected without difficulty or complication. After waiting for at least 3 minutes, the ablation was performed. Once completed, the needle was removed intact. Radiofrequency lesioning (ablation):  Radiofrequency Generator: Medtronic AccurianTM AG 1000 RF Generator Sensory Stimulation Parameters: 50 Hz was used to locate & identify the nerve, making sure that the needle was positioned such that there was no sensory stimulation below 0.3 V or above 0.7 V. Motor Stimulation Parameters: 2 Hz was used to evaluate the motor component. Care was taken not to lesion any nerves that demonstrated motor  stimulation of the lower extremities at an output of less than 2.5 times that of the sensory threshold, or a maximum of 2.0 V. Lesioning Technique Parameters: Standard Radiofrequency settings. (Not bipolar or pulsed.) Temperature Settings: 80 degrees C Lesioning time: 60 seconds Stationary intra-operative compliance: Compliant  Once the entire procedure was completed, the treated area was cleaned, making sure to leave some of the prepping solution back to take advantage of its long  term bactericidal properties.    Illustration of the posterior view of the lumbar spine and the posterior neural structures. Laminae of L2 through S1 are labeled. DPRL5, dorsal primary ramus of L5; DPRS1, dorsal primary ramus of S1; DPR3, dorsal primary ramus of L3; FJ, facet (zygapophyseal) joint L3-L4; I, inferior articular process of L4; LB1, lateral branch of dorsal primary ramus of L1; IAB, inferior articular branches from L3 medial branch (supplies L4-L5 facet joint); IBP, intermediate branch plexus; MB3, medial branch of dorsal primary ramus of L3; NR3, third lumbar nerve root; S, superior articular process of L5; SAB, superior articular branches from L4 (supplies L4-5 facet joint also); TP3, transverse process of L3.  Facet Joint Innervation (* possible contribution)  L1-2 T12, L1 (L2*)  Medial Branch  L2-3 L1, L2 (L3*)                     L3-4 L2, L3 (L4*)                     L4-5 L3, L4 (L5*)                     L5-S1 L4, L5, S1                       Description of Procedure #2:  Target Area: Space between lunate and ulnar head Approach: Dorsal approach. Area Prepped: Entire wrist Region ChloraPrep (2% chlorhexidine  gluconate and 70% isopropyl alcohol) Safety Precautions: Aspiration looking for blood return was conducted prior to all injections. At no point did we inject any substances, as a needle was being advanced. No attempts were made at seeking any paresthesias. Safe injection practices and  needle disposal techniques used. Medications properly checked for expiration dates. SDV (single dose vial) medications used. Description of the Procedure: Protocol guidelines were followed. The patient was placed in position. The target area was identified and the area prepped in the usual manner. Skin & deeper tissues infiltrated with local anesthetic. Appropriate amount of time allowed to pass for local anesthetics to take effect. The procedure needles were then advanced to the target area. Proper needle placement secured. Negative aspiration confirmed. Solution injected in intermittent fashion, asking for systemic symptoms every 0.5cc of injectate. The needles were then removed and the area cleansed, making sure to leave some of the prepping solution back to take advantage of its long term bactericidal properties.    Vitals:   04/29/24 1010 04/29/24 1019 04/29/24 1030 04/29/24 1038  BP: 122/87 (!) 122/56 (!) 109/96 109/68  Pulse:      Resp: 18 15 16 16   Temp:  98 F (36.7 C)  (!) 97.1 F (36.2 C)  TempSrc:  Temporal  Temporal  SpO2: 98% 95% 96% 97%  Weight:      Height:        Start Time: 0935 hrs. End Time: 1010 hrs.  Imaging Guidance (Spinal) for procedure No.1: Type of Imaging Technique: Fluoroscopy Guidance (Spinal) Indication(s): Fluoroscopy guidance for needle placement to enhance accuracy in procedures requiring precise needle localization for targeted delivery of medication in or near specific anatomical locations not easily accessible without such real-time imaging assistance. Exposure Time: Please see nurses notes. Contrast: None used. Fluoroscopic Guidance: I was personally present during the use of fluoroscopy. Tunnel Vision Technique used to obtain the best possible view of the target area. Parallax error corrected before commencing the procedure. Direction-depth-direction technique used to introduce the needle  under continuous pulsed fluoroscopy. Once target was  reached, antero-posterior, oblique, and lateral fluoroscopic projection used confirm needle placement in all planes. Images permanently stored in EMR. Interpretation: No contrast injected. I personally interpreted the imaging intraoperatively. Adequate needle placement confirmed in multiple planes. Permanent images saved into the patient's record.  (Procedure No.2) Type of Imaging Technique: None used Indication(s): N/A Exposure Time: No patient exposure Contrast: None used. Fluoroscopic Guidance: N/A Ultrasound Guidance: N/A Interpretation: N/A  Antibiotic Prophylaxis:   Anti-infectives (From admission, onward)    Start     Dose/Rate Route Frequency Ordered Stop   04/29/24 0845  ceFAZolin  (ANCEF ) IVPB 1 g/50 mL premix        1 g 100 mL/hr over 30 Minutes Intravenous  Once 04/29/24 0839 04/29/24 0913   04/29/24 0000  fluconazole  (DIFLUCAN ) 150 MG tablet  Status:  Discontinued        150 mg Oral Once PRN 04/29/24 9077 04/29/24       Indication(s): None identified  Post-operative Assessment:  Post-procedure Vital Signs:  Pulse/HCG Rate: 8481 Temp: (!) 97.1 F (36.2 C) Resp: 16 BP: 109/68 SpO2: 97 %  EBL: None  Complications: No immediate post-treatment complications observed by team, or reported by patient.  Note: The patient tolerated the entire procedure well. A repeat set of vitals were taken after the procedure and the patient was kept under observation following institutional policy, for this type of procedure. Post-procedural neurological assessment was performed, showing return to baseline, prior to discharge. The patient was provided with post-procedure discharge instructions, including a section on how to identify potential problems. Should any problems arise concerning this procedure, the patient was given instructions to immediately contact us , at any time, without hesitation. In any case, we plan to contact the patient by telephone for a follow-up status report  regarding this interventional procedure.  Comments:  No additional relevant information.  Plan of Care (POC)  Orders:  Orders Placed This Encounter  Procedures   Radiofrequency,Lumbar    Scheduling Instructions:     Side(s): Right-sided     Level: L3-4, L4-5, and L5-S1 Facets (L2, L3, L4, L5, and S1 Medial Branch)     Sedation: With Sedation.     Date: 04/29/2024    Where will this procedure be performed?:   [facility]   Small Joint Injection/Arthrocentesis    Joint: wrist Laterality: Right Sedation: Patient's choice Purpose: Diagnostic Indication(s): Sub-acute pain    Scheduling Instructions:     Date: 04/29/2024   DG PAIN CLIN[facility] C-ARM 1-60 MIN NO REPORT    Intraoperative interpretation by procedural physician at [facility].    Standing Status:   Standing    Number of Occurrences:   1    Reason for exam::   Assistance in needle guidance and placement for procedures requiring needle placement in or near specific anatomical locations not easily accessible without such assistance.   Informed Consent Details: Physician/Practitioner Attestation; Transcribe to consent form and obtain patient signature    Nursing Order: Transcribe to consent form and obtain patient signature. Note: Always confirm laterality of pain with Ms. Oneita, before procedure.    Physician/Practitioner attestation of informed consent for procedure/surgical case:   I, the physician/practitioner, attest that I have discussed with the patient the benefits, risks, side effects, alternatives, likelihood of achieving goals and potential problems during recovery for the procedure that I have provided informed consent.    Procedure:   Lumbar Facet Radiofrequency Ablation    Physician/Practitioner performing the  procedure:   Shamica Moree A. Tanya, MD    Indication/Reason:   Low Back Pain, with our without leg pain, due to Facet Joint Arthralgia (Joint Pain) known as Lumbar Facet Syndrome, secondary to  Lumbar, and/or Lumbosacral Spondylosis (Arthritis of the Spine), without myelopathy or radiculopathy (Nerve Damage).   Provide equipment / supplies at bedside    Procedure tray: Radiofrequency Tray Additional material: Large hemostat (x1); Small hemostat (x1); Towels (x8); 4x4 sterile sponge pack (x1) Needle type: Teflon-coated Radiofrequency Needle (Disposable  single use) Size: Long Quantity: 5    Standing Status:   Standing    Number of Occurrences:   1    Specify:   Radiofrequency Tray   Informed Consent Details: Physician/Practitioner Attestation; Transcribe to consent form and obtain patient signature    Nursing Order: Transcribe to consent form and obtain patient signature. Note: Always confirm laterality of pain with Ms. Oneita, before procedure.    Physician/Practitioner attestation of informed consent for procedure/surgical case:   I, the physician/practitioner, attest that I have discussed with the patient the benefits, risks, side effects, alternatives, likelihood of achieving goals and potential problems during recovery for the procedure that I have provided informed consent.    Procedure:   Small Joint Injection and/or Aspiration (Arthrocentesis).     Physician/Practitioner performing the procedure:   Tremane Spurgeon A. [place] Crance, MD    Indication/Reason:      Provide equipment / supplies at bedside    Procedure tray: Block Tray (Disposable  single use) Skin infiltration needle: Regular 1.5-in, 25-G, (x1) Block Needle type: Regular Amount/quantity: 1 Size: Short(1.5-inch) Gauge: (25G x1) + (22G x1)    Standing Status:   Standing    Number of Occurrences:   1    Specify:   Block Tray   Saline lock IV    Have LR [phone] mL available and administer at 125 mL/hr if patient becomes hypotensive.    Standing Status:   Standing    Number of Occurrences:   1   Miscellanous precautions    Standing Status:   Standing    Number of Occurrences:   1    Opioid Analgesic: No chronic  opioid analgesics therapy prescribed by our practice.  Discontinued (11/21/2021) UDS. (+) COCAINE.   Medications ordered for procedure: Meds ordered this encounter  Medications   lidocaine  (XYLOCAINE ) 2 % (with pres) injection 400 mg   pentafluoroprop-tetrafluoroeth (GEBAUERS) aerosol   midazolam  (VERSED ) 5 MG/5ML injection 0.5-2 mg    Make sure Flumazenil is available in the pyxis when using this medication. If oversedation occurs, administer 0.2 mg IV over 15 sec. If after 45 sec no response, administer 0.2 mg again over 1 min; may repeat at 1 min intervals; not to exceed 4 doses (1 mg)   fentaNYL  (SUBLIMAZE ) injection 25-50 mcg    Make sure Narcan  is available in the pyxis when using this medication. In the event of respiratory depression (RR< 8/min): Titrate NARCAN  (naloxone ) in increments of 0.1 to 0.2 mg IV at 2-3 minute intervals, until desired degree of reversal.   ropivacaine  (PF) 2 mg/mL (0.2%) (NAROPIN ) injection 9 mL   triamcinolone  acetonide (KENALOG -40) injection 40 mg   DISCONTD: HYDROcodone -acetaminophen  (NORCO/VICODIN) 5-325 MG tablet    Sig: Take 1 tablet by mouth every 6 (six) hours as needed for up to 7 days for severe pain (pain score 7-10). Must last 7 days.    Dispense:  28 tablet    Refill:  0    For acute post-operative pain. Not  to be refilled. Must last 7 days.   DISCONTD: HYDROcodone -acetaminophen  (NORCO/VICODIN) 5-325 MG tablet    Sig: Take 1 tablet by mouth every 6 (six) hours as needed for up to 7 days for severe pain (pain score 7-10). Must last 7 days.    Dispense:  28 tablet    Refill:  0    For acute post-operative pain. Not to be refilled.  Must last 7 days.   ropivacaine  (PF) 2 mg/mL (0.2%) (NAROPIN ) injection 1 mL   methylPREDNISolone  acetate (DEPO-MEDROL ) injection 80 mg   ceFAZolin  (ANCEF ) IVPB 1 g/50 mL premix    Antibiotic Indication::   Surgical Prophylaxis    Other Indication::   Procedure Prophylaxis   DISCONTD: fluconazole  (DIFLUCAN ) 150 MG  tablet    Sig: Take 1 tablet (150 mg total) by mouth once as needed for up to 1 dose.    Dispense:  2 tablet    Refill:  0   oxyCODONE  (OXY IR/ROXICODONE ) 5 MG immediate release tablet    Sig: Take 1 tablet (5 mg total) by mouth every 6 (six) hours as needed for up to 7 days for severe pain (pain score 7-10). Must last 30 days.    Dispense:  28 tablet    Refill:  0   oxyCODONE  (OXY IR/ROXICODONE ) 5 MG immediate release tablet    Sig: Take 1 tablet (5 mg total) by mouth every 6 (six) hours as needed for up to 7 days for severe pain (pain score 7-10). Must last 30 days.    Dispense:  28 tablet    Refill:  0   Medications administered: We administered lidocaine , pentafluoroprop-tetrafluoroeth, midazolam , fentaNYL , ropivacaine  (PF) 2 mg/mL (0.2%), triamcinolone  acetonide, ropivacaine  (PF) 2 mg/mL (0.2%), methylPREDNISolone  acetate, and ceFAZolin .  See the medical record for exact dosing, route, and time of administration.    Interventional Therapies  Risk Factors  Considerations:   Allergy (Anaphylactic-type): Contrast dye.  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 (+) COCAINE.   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) (04/29/2024)    Under consideration:   Therapeutic left IA hip injection + TBI #3  Therapeutic bilateral lumbar facet RFA #3 (starting with the right side and following up with the left 2 weeks later)   Completed:   Palliative left trochanteric bursa x2 (09/18/2021) (100/100/90)  Palliative right trochanteric bursa x4 (09/17/2022) (100/100/90/70)  Palliative right suprascapular NB x3 (08/08/2020) (50/50/100/90-100)   Palliative left suprascapular NB x4 (02/22/2021) (100/100/90/90)  Palliative bilateral suprascapular nerve RFA x1 (05/13/2019) (100/100/90/90-100)   Therapeutic right IA hip joint injection x6 (09/17/2022) (100/100/70/70)  Therapeutic left IA hip joint injection x4 (09/18/2021) (100/100/90)  Diagnostic bilateral lumbar facet MBB x6 (01/15/2024) (100/100/100 x 3 days/40)  Palliative right lumbar facet RFA x2 (12/16/2017) (100/100/85)  Palliative left lumbar facet RFA x2 (02/12/2018) [facility])  Diagnostic right cervical facet MBB x2 (04/11/2022) (100/100/100/100)  Diagnostic left cervical facet MBB x1 (09/30/2016) (90/100/0)  Diagnostic right SI joint block x1 (06/12/2017) (DNKFU -0/0/80)    Therapeutic  Palliative (PRN) options:  Therapeutic suprascapular nerve RFA  Therapeutic lumbar facet RFA    Pharmacotherapy  Nonopioids transferred 05/15/2020: Zanaflex  and Voltaren  gel       Follow-up plan:   Return in about 2 weeks (around 05/13/2024) for (ECT):(L) L-FCT RFA#3, (PPE).  Recent Visits Date Type Provider Dept  03/01/24 Office Visit Tanya Glisson, MD [facility]  Showing recent visits within past 90 days and meeting all other requirements Today's Visits Date Type Provider Dept  04/29/24 Procedure visit Tanya Glisson, MD [facility]  Showing today's visits and meeting all other requirements Future Appointments Date Type Provider Dept  05/19/24 Appointment Tanya Glisson, MD [facility]  Showing future appointments within next 90 days and meeting all other requirements   Disposition: Discharge home  Discharge (Date  Time): 04/29/2024; 1042 hrs.   Primary Care Physician: Alyse Bradley, MD Location: [facility] Note by: Glisson DELENA Tanya, MD (TTS technology used. I apologize for any typographical errors that were not detected and corrected.) Date: 04/29/2024; Time: 10:53 AM  Disclaimer:  Medicine is not an Visual merchandiser. The only guarantee in medicine is that nothing is guaranteed. It is important to note that the decision to proceed with this  intervention was based on the information collected from the patient. The Data and conclusions were drawn from the patient's questionnaire, the interview, and the physical examination. Because the information was provided in large part by the patient, it cannot be guaranteed that it has not been purposely or unconsciously manipulated. Every effort has been made to obtain as much relevant data as possible for this evaluation. It is important to note that the conclusions that lead to this procedure are derived in large part from the available data. Always take into account that the treatment will also be dependent on availability of resources and existing treatment guidelines, considered by other Pain Management Practitioners as being common knowledge and practice, at the time of the intervention. For Medico-Legal purposes, it is also important to point out that variation in procedural techniques and pharmacological choices are the acceptable norm. The indications, contraindications, technique, and results of the above procedure should only be interpreted and judged by a Board-Certified Interventional Pain Specialist with extensive familiarity and expertise in the same exact procedure and technique.

## 2024-04-29 NOTE — Patient Instructions (Addendum)
 ______________________________________________________________________    Post-Radiofrequency (RF) Discharge Instructions  You have just completed a Radiofrequency Neurotomy.  The following instructions will provide you with information and guidelines for self-care upon discharge.  If at any time you have questions or concerns please call your physician. DO NOT DRIVE YOURSELF!!  Instructions: Apply ice: Fill a plastic sandwich bag with crushed ice. Cover it with a small towel and apply to injection site. Apply for 15 minutes then remove x 15 minutes. Repeat sequence on day of procedure, until you go to bed. The purpose is to minimize swelling and discomfort after procedure. Apply heat: Apply heat to procedure site starting the day following the procedure. The purpose is to treat any soreness and discomfort from the procedure. Food intake: No eating limitations, unless stipulated above.  Nevertheless, if you have had sedation, you may experience some nausea.  In this case, it may be wise to wait at least two hours prior to resuming regular diet. Physical activities: Keep activities to a minimum for the first 8 hours after the procedure. For the first 24 hours after the procedure, do not drive a motor vehicle,  Operate heavy machinery, power tools, or handle any weapons.  Consider walking with the use of an assistive device or accompanied by an adult for the first 24 hours.  Do not drink alcoholic beverages including beer.  Do not make any important decisions or sign any legal documents. Go home and rest today.  Resume activities tomorrow, as tolerated.  Use caution in moving about as you may experience mild leg weakness.  Use caution in cooking, use of household electrical appliances and climbing steps. Driving: If you have received any sedation, you are not allowed to drive for 24 hours after your procedure. Blood thinner: Restart your blood thinner 6 hours after your procedure. (Only for those taking  blood thinners) Insulin : As soon as you can eat, you may resume your normal dosing schedule. (Only for those taking insulin ) Medications: May resume pre-procedure medications.  Do not take any drugs, other than what has been prescribed to you. Infection prevention: Keep procedure site clean and dry. Post-procedure Pain Diary: Extremely important that this be done correctly and accurately. Recorded information will be used to determine the next step in treatment. Pain evaluated is that of treated area only. Do not include pain from an untreated area. Complete every hour, on the hour, for the initial 8 hours. Set an alarm to help you do this part accurately. Do not go to sleep and have it completed later. It will not be accurate. Follow-up appointment: Keep your follow-up appointment after the procedure. Usually 2-6 weeks after radiofrequency. Bring you pain diary. The information collected will be essential for your long-term care.   Expect: From numbing medicine (AKA: Local Anesthetics): Numbness or decrease in pain. Onset: Full effect within 15 minutes of injected. Duration: It will depend on the type of local anesthetic used. On the average, 1 to 8 hours.  From steroids (when added): Decrease in swelling or inflammation. Once inflammation is improved, relief of the pain will follow. Onset of benefits: Depends on the amount of swelling present. The more swelling, the longer it will take for the benefits to be seen. In some cases, up to 10 days. Duration: Steroids will stay in the system x 2 weeks. Duration of benefits will depend on multiple posibilities including persistent irritating factors. From procedure: Some discomfort is to be expected once the numbing medicine wears off. In the case of  radiofrequency procedures, this may last as long as 6-8 weeks. Additional post-procedure pain medication is provided for this. Discomfort is minimized if ice and heat are applied as instructed.  Call  if: You experience numbness and weakness that gets worse with time, as opposed to wearing off. He experience any unusual bleeding, difficulty breathing, or loss of the ability to control your bowel and bladder. (This applies to Spinal procedures only) You experience any redness, swelling, heat, red streaks, elevated temperature, fever, or any other signs of a possible infection.  Emergency Numbers: Durning business hours (Monday - Thursday, 8:00 AM - 4:00 PM) (Friday, 9:00 AM - 12:00 Noon): [phone] After hours: [phone] ______________________________________________________________________    ______________________________________________________________________    Steroid injections  Common steroids for injections Triamcinolone : Used by many sports medicine physicians for large joint and bursal injections, often combined with a local anesthetic like lidocaine . A study focusing on coccydynia (tailbone pain) found triamcinolone  was more effective than betamethasone, suggesting it may also be preferable for other localized inflammation conditions. Methylprednisolone : A common alternative to triamcinolone  that is also a strong anti-inflammatory. It is available in different formulations, with the acetate suspension being the long-acting option for intra-articular injections. Dexamethasone: This is a non-particulate steroid, meaning it has a lower risk of tissue damage compared to particulate steroids like triamcinolone  and methylprednisolone . While less common for this specific use, it is an option for targeted injections.   Considerations for physicians Particulate vs. non-particulate steroids: Triamcinolone  and methylprednisolone  are particulate, meaning they can clump together. Dexamethasone is non-particulate. Particulate steroids are often preferred for their longer-lasting effects but carry a theoretical higher risk for certain injections (though this is less of a concern in the  costochondral joints). Combined injectate: Corticosteroids are typically mixed with a local anesthetic like lidocaine  to provide both immediate pain relief (from the anesthetic) and longer-term inflammation reduction (from the steroid). Imaging guidance: To ensure accurate placement of the needle and medication, physicians may use ultrasound or fluoroscopic guidance for the injection, especially in complex or refractory cases.   Patient guidance Before undergoing a steroid injection, discuss the options with your physician. They will determine the best steroid, dosage, and procedure for your specific case based on factors like: Severity of your condition History of response to other treatments Your overall health status Experience and preference of the physician  Last  Updated: 03/09/2024 ______________________________________________________________________     ______________________________________________________________________    Procedure instructions  Stop blood-thinners  Do not eat or drink fluids (other than water ) for 6 hours before your procedure  No water  for 2 hours before your procedure  Take your blood pressure medicine with a sip of water   Arrive 30 minutes before your appointment  If sedation is planned, bring suitable driver. [place], [place], & public transportation are NOT APPROVED)  Carefully read the Preparing for your procedure detailed instructions  If you have questions call us  at [phone]  Procedure appointments are for procedures only.   NO medication refills or new problem evaluations will be done on procedure days.   Only the scheduled, pre-approved procedure and side will be done.   ______________________________________________________________________     ______________________________________________________________________    Preparing for your procedure  Appointments: If you think you may not be able to keep your appointment, call  24-48 hours in advance to cancel. We need time to make it available to others.  Procedure visits are for procedures only. During your procedure appointment there will be: NO Prescription Refills*. NO medication  changes or discussions*. NO discussion of disability issues*. NO unrelated pain problem evaluations*. NO evaluations to order other pain procedures*. *These will be addressed at a separate and distinct evaluation encounter on the provider's evaluation schedule and not during procedure days.  Instructions: Food intake: Avoid eating anything solid for at least 8 hours prior to your procedure. Clear liquid intake: You may take clear liquids such as water  up to 2 hours prior to your procedure. (No carbonated drinks. No soda.) Transportation: Unless otherwise stated by your physician, bring a driver. (Driver cannot be a Market researcher, Pharmacist, community, or any other form of public transportation.) Morning Medicines: Except for blood thinners, take all of your other morning medications with a sip of water . Make sure to take your heart and blood pressure medicines. If your blood pressure's lower number is above 100, the case will be rescheduled. Blood thinners: Make sure to stop your blood thinners as instructed.  If you take a blood thinner, but were not instructed to stop it, call our office [phone] and ask to talk to a nurse. Not stopping a blood thinner prior to certain procedures could lead to serious complications. Diabetics on insulin : Notify the staff so that you can be scheduled 1st case in the morning. If your diabetes requires high dose insulin , take only  of your normal insulin  dose the morning of the procedure and notify the staff that you have done so. Preventing infections: Shower with an antibacterial soap the morning of your procedure.  Build-up your immune system: Take 1000 mg of Vitamin C with every meal (3 times a day) the day prior to your procedure. Antibiotics: Inform the nursing staff if  you are taking any antibiotics or if you have any conditions that may require antibiotics prior to procedures. (Example: recent joint implants)   Pregnancy: If you are pregnant make sure to notify the nursing staff. Not doing so may result in injury to the fetus, including death.  Sickness: If you have a cold, fever, or any active infections, call and cancel or reschedule your procedure. Receiving steroids while having an infection may result in complications. Arrival: You must be in the facility at least 30 minutes prior to your scheduled procedure. Tardiness: Your scheduled time is also the cutoff time. If you do not arrive at least 15 minutes prior to your procedure, you will be rescheduled.  Children: Do not bring any children with you. Make arrangements to keep them home. Dress appropriately: There is always a possibility that your clothing may get soiled. Avoid long dresses. Valuables: Do not bring any jewelry or valuables.  Reasons to call and reschedule or cancel your procedure: (Following these recommendations will minimize the risk of a serious complication.) Surgeries: Avoid having procedures within 2 weeks of any surgery. (Avoid for 2 weeks before or after any surgery). Flu Shots: Avoid having procedures within 2 weeks of a flu shots or . (Avoid for 2 weeks before or after immunizations). Barium: Avoid having a procedure within 7-10 days after having had a radiological study involving the use of radiological contrast. (Myelograms, Barium swallow or enema study). Heart attacks: Avoid any elective procedures or surgeries for the initial 6 months after a Myocardial Infarction (Heart Attack). Blood thinners: It is imperative that you stop these medications before procedures. Let us  know if you if you take any blood thinner.  Infection: Avoid procedures during or within two weeks of an infection (including chest colds or gastrointestinal problems). Symptoms associated with infections include:  Localized  redness, fever, chills, night sweats or profuse sweating, burning sensation when voiding, cough, congestion, stuffiness, runny nose, sore throat, diarrhea, nausea, vomiting, cold or Flu symptoms, recent or current infections. It is specially important if the infection is over the area that we intend to treat. Heart and lung problems: Symptoms that may suggest an active cardiopulmonary problem include: cough, chest pain, breathing difficulties or shortness of breath, dizziness, ankle swelling, uncontrolled high or unusually low blood pressure, and/or palpitations. If you are experiencing any of these symptoms, cancel your procedure and contact your primary care physician for an evaluation.  Remember:  Regular Business hours are:  Monday to Thursday 8:00 AM to 4:00 PM  Provider's Schedule: Eric Como, MD:  Procedure days: Tuesday and Thursday 7:30 AM to 4:00 PM  Wallie Sherry, MD:  Procedure days: Monday and Wednesday 7:30 AM to 4:00 PM Last  Updated: 06/24/2023 ______________________________________________________________________     ______________________________________________________________________    General Risks and Possible Complications  Patient Responsibilities: It is important that you read this as it is part of your informed consent. It is our duty to inform you of the risks and possible complications associated with treatments offered to you. It is your responsibility as a patient to read this and to ask questions about anything that is not clear or that you believe was not covered in this document.  Patient's Rights: You have the right to refuse treatment. You also have the right to change your mind, even after initially having agreed to have the treatment done. However, under this last option, if you wait until the last second to change your mind, you may be charged for the materials used up to that point.  Introduction: Medicine is not an Visual merchandiser.  Everything in Medicine, including the lack of treatment(s), carries the potential for danger, harm, or loss (which is by definition: Risk). In Medicine, a complication is a secondary problem, condition, or disease that can aggravate an already existing one. All treatments carry the risk of possible complications. The fact that a side effects or complications occurs, does not imply that the treatment was conducted incorrectly. It must be clearly understood that these can happen even when everything is done following the highest safety standards.  No treatment: You can choose not to proceed with the proposed treatment alternative. The "PRO(s)" would include: avoiding the risk of complications associated with the therapy. The "CON(s)" would include: not getting any of the treatment benefits. These benefits fall under one of three categories: diagnostic; therapeutic; and/or palliative. Diagnostic benefits include: getting information which can ultimately lead to improvement of the disease or symptom(s). Therapeutic benefits are those associated with the successful treatment of the disease. Finally, palliative benefits are those related to the decrease of the primary symptoms, without necessarily curing the condition (example: decreasing the pain from a flare-up of a chronic condition, such as incurable terminal cancer).  General Risks and Complications: These are associated to most interventional treatments. They can occur alone, or in combination. They fall under one of the following six (6) categories: no benefit or worsening of symptoms; bleeding; infection; nerve damage; allergic reactions; and/or death. No benefits or worsening of symptoms: In Medicine there are no guarantees, only probabilities. No healthcare provider can ever guarantee that a medical treatment will work, they can only state the probability that it may. Furthermore, there is always the possibility that the condition may worsen, either  directly, or indirectly, as a consequence of the treatment. Bleeding: This is more common  if the patient is taking a blood thinner, either prescription or over the counter (example: Goody Powders, Fish oil, Aspirin, Garlic, etc.), or if suffering a condition associated with impaired coagulation (example: Hemophilia, cirrhosis of the liver, low platelet counts, etc.). However, even if you do not have one on these, it can still happen. If you have any of these conditions, or take one of these drugs, make sure to notify your treating physician. Infection: This is more common in patients with a compromised immune system, either due to disease (example: diabetes, cancer, human immunodeficiency virus [HIV], etc.), or due to medications or treatments (example: therapies used to treat cancer and rheumatological diseases). However, even if you do not have one on these, it can still happen. If you have any of these conditions, or take one of these drugs, make sure to notify your treating physician. Nerve Damage: This is more common when the treatment is an invasive one, but it can also happen with the use of medications, such as those used in the treatment of cancer. The damage can occur to small secondary nerves, or to large primary ones, such as those in the spinal cord and brain. This damage may be temporary or permanent and it may lead to impairments that can range from temporary numbness to permanent paralysis and/or brain death. Allergic Reactions: Any time a substance or material comes in contact with our body, there is the possibility of an allergic reaction. These can range from a mild skin rash (contact dermatitis) to a severe systemic reaction (anaphylactic reaction), which can result in death. Death: In general, any medical intervention can result in death, most of the time due to an unforeseen complication. ______________________________________________________________________

## 2024-04-30 ENCOUNTER — Telehealth: Payer: Self-pay

## 2024-04-30 NOTE — Telephone Encounter (Signed)
 Post Procedure Call, patient reports some soreness but nothing concerning. Says she is goung to use her heating pad today.

## 2024-05-13 ENCOUNTER — Ambulatory Visit (HOSPITAL_BASED_OUTPATIENT_CLINIC_OR_DEPARTMENT_OTHER): Admitting: Pain Medicine

## 2024-05-13 ENCOUNTER — Ambulatory Visit
Admission: RE | Admit: 2024-05-13 | Discharge: 2024-05-13 | Disposition: A | Discharge status: Home / Self Care | Source: Ambulatory Visit | Attending: Pain Medicine | Admitting: Pain Medicine

## 2024-05-13 ENCOUNTER — Telehealth: Payer: Self-pay

## 2024-05-13 ENCOUNTER — Encounter: Payer: Self-pay | Admitting: Pain Medicine

## 2024-05-13 VITALS — BP 118/74 | HR 92 | Temp 97.2°F | Resp 18 | Ht 65.0 in | Wt 234.0 lb

## 2024-05-13 DIAGNOSIS — Z889 Allergy status to unspecified drugs, medicaments and biological substances status: Secondary | ICD-10-CM | POA: Diagnosis present

## 2024-05-13 DIAGNOSIS — G8918 Other acute postprocedural pain: Secondary | ICD-10-CM

## 2024-05-13 DIAGNOSIS — Z8619 Personal history of other infectious and parasitic diseases: Secondary | ICD-10-CM | POA: Insufficient documentation

## 2024-05-13 DIAGNOSIS — M961 Postlaminectomy syndrome, not elsewhere classified: Secondary | ICD-10-CM | POA: Insufficient documentation

## 2024-05-13 DIAGNOSIS — G8929 Other chronic pain: Secondary | ICD-10-CM | POA: Insufficient documentation

## 2024-05-13 DIAGNOSIS — Z09 Encounter for follow-up examination after completed treatment for conditions other than malignant neoplasm: Secondary | ICD-10-CM

## 2024-05-13 DIAGNOSIS — M545 Low back pain, unspecified: Secondary | ICD-10-CM | POA: Insufficient documentation

## 2024-05-13 DIAGNOSIS — M5459 Other low back pain: Secondary | ICD-10-CM

## 2024-05-13 DIAGNOSIS — M47817 Spondylosis without myelopathy or radiculopathy, lumbosacral region: Secondary | ICD-10-CM | POA: Diagnosis not present

## 2024-05-13 DIAGNOSIS — M47816 Spondylosis without myelopathy or radiculopathy, lumbar region: Secondary | ICD-10-CM

## 2024-05-13 DIAGNOSIS — Z96641 Presence of right artificial hip joint: Secondary | ICD-10-CM

## 2024-05-13 MED ORDER — OXYCODONE HCL 5 MG PO TABS: 5.0000 mg | ORAL_TABLET | Freq: Four times a day (QID) | ORAL | 0 refills | Status: DC | PRN

## 2024-05-13 MED ORDER — TRIAMCINOLONE ACETONIDE 40 MG/ML IJ SUSP
40.0000 mg | Freq: Once | INTRAMUSCULAR | Status: AC
  Administered 2024-05-13: 40 mg

## 2024-05-13 MED ORDER — MIDAZOLAM HCL 5 MG/5ML IJ SOLN
0.5000 mg | Freq: Once | INTRAMUSCULAR | Status: AC
  Administered 2024-05-13: 2.5 mg via INTRAVENOUS

## 2024-05-13 MED ORDER — CEFAZOLIN SODIUM-DEXTROSE 1-4 GM/50ML-% IV SOLN
1.0000 g | Freq: Once | INTRAVENOUS | Status: AC
  Administered 2024-05-13: 1 g via INTRAVENOUS

## 2024-05-13 MED ORDER — PENTAFLUOROPROP-TETRAFLUOROETH EX AERO
INHALATION_SPRAY | Freq: Once | CUTANEOUS | Status: AC
  Administered 2024-05-13: 30 via TOPICAL

## 2024-05-13 MED ORDER — MIDAZOLAM HCL 5 MG/5ML IJ SOLN
INTRAMUSCULAR | Status: AC
  Filled 2024-05-13: qty 5

## 2024-05-13 MED ORDER — ROPIVACAINE HCL 2 MG/ML IJ SOLN
9.0000 mL | Freq: Once | INTRAMUSCULAR | Status: AC
  Administered 2024-05-13: 9 mL via PERINEURAL

## 2024-05-13 MED ORDER — TRIAMCINOLONE ACETONIDE 40 MG/ML IJ SUSP
INTRAMUSCULAR | Status: AC
  Filled 2024-05-13: qty 1

## 2024-05-13 MED ORDER — CEFAZOLIN SODIUM 1 G IJ SOLR
INTRAMUSCULAR | Status: AC
  Filled 2024-05-13: qty 10

## 2024-05-13 MED ORDER — LIDOCAINE HCL 2 % IJ SOLN
INTRAMUSCULAR | Status: AC
  Filled 2024-05-13: qty 20

## 2024-05-13 MED ORDER — FLUCONAZOLE 150 MG PO TABS: 150.0000 mg | ORAL_TABLET | Freq: Once | ORAL | 0 refills | Status: DC | PRN

## 2024-05-13 MED ORDER — LIDOCAINE HCL 2 % IJ SOLN
20.0000 mL | Freq: Once | INTRAMUSCULAR | Status: AC
  Administered 2024-05-13: 400 mg

## 2024-05-13 MED ORDER — ROPIVACAINE HCL 2 MG/ML IJ SOLN
INTRAMUSCULAR | Status: AC
  Filled 2024-05-13: qty 20

## 2024-05-13 MED ORDER — FENTANYL CITRATE (PF) 100 MCG/2ML IJ SOLN
25.0000 ug | INTRAMUSCULAR | Status: DC | PRN
  Administered 2024-05-13: 100 ug via INTRAVENOUS

## 2024-05-13 MED ORDER — FENTANYL CITRATE (PF) 100 MCG/2ML IJ SOLN
INTRAMUSCULAR | Status: AC
  Filled 2024-05-13: qty 2

## 2024-05-13 NOTE — Patient Instructions (Addendum)
 Moderate Conscious Sedation, Adult, Care After After the procedure, it is common to have: Sleepiness for a few hours. Impaired judgment for a few hours. Trouble with balance. Nausea or vomiting if you eat too soon. Follow these instructions at home: For the time period you were told by your health care provider:  Rest. Do not participate in activities where you could fall or become injured. Do not drive or use machinery. Do not drink alcohol. Do not take sleeping pills or medicines that cause drowsiness. Do not make important decisions or sign legal documents. Do not take care of children on your own. Eating and drinking Follow instructions from your health care provider about what you may eat and drink. Drink enough fluid to keep your urine pale yellow. If you vomit: Drink clear fluids slowly and in small amounts as you are able. Clear fluids include water, ice chips, low-calorie sports drinks, and fruit juice that has water added to it (diluted fruit juice). Eat light and bland foods in small amounts as you are able. These foods include bananas, applesauce, rice, lean meats, toast, and crackers. General instructions Take over-the-counter and prescription medicines only as told by your health care provider. Have a responsible adult stay with you for the time you are told. Do not use any products that contain nicotine or tobacco. These products include cigarettes, chewing tobacco, and vaping devices, such as e-cigarettes. If you need help quitting, ask your health care provider. Return to your normal activities as told by your health care provider. Ask your health care provider what activities are safe for you. Your health care provider may give you more instructions. Make sure you know what you can and cannot do. Contact a health care provider if: You are still sleepy or having trouble with balance after 24 hours. You feel light-headed. You vomit every time you eat or drink. You get  a rash. You have a fever. You have redness or swelling around the IV site. Get help right away if: You have trouble breathing. You start to feel confused at home. These symptoms may be an emergency. Get help right away. Call 911. Do not wait to see if the symptoms will go away. Do not drive yourself to the hospital. This information is not intended to replace advice given to you by your health care provider. Make sure you discuss any questions you have with your health care provider. Document Revised: 01/14/2022 Document Reviewed: 01/14/2022 Elsevier Patient Education  2024 [employer] ______________________________________________________________________    Post-Radiofrequency (RF) Discharge Instructions  You have just completed a Radiofrequency Neurotomy.  The following instructions will provide you with information and guidelines for self-care upon discharge.  If at any time you have questions or concerns please call your physician. DO NOT DRIVE YOURSELF!!  Instructions: Apply ice: Fill a plastic sandwich bag with crushed ice. Cover it with a small towel and apply to injection site. Apply for 15 minutes then remove x 15 minutes. Repeat sequence on day of procedure, until you go to bed. The purpose is to minimize swelling and discomfort after procedure. Apply heat: Apply heat to procedure site starting the day following the procedure. The purpose is to treat any soreness and discomfort from the procedure. Food intake: No eating limitations, unless stipulated above.  Nevertheless, if you have had sedation, you may experience some nausea.  In this case, it may be wise to wait at least two hours prior to resuming regular diet. Physical activities: Keep activities to a minimum  for the first 8 hours after the procedure. For the first 24 hours after the procedure, do not drive a motor vehicle,  Operate heavy machinery, power tools, or handle any weapons.  Consider walking with the use of an  assistive device or accompanied by an adult for the first 24 hours.  Do not drink alcoholic beverages including beer.  Do not make any important decisions or sign any legal documents. Go home and rest today.  Resume activities tomorrow, as tolerated.  Use caution in moving about as you may experience mild leg weakness.  Use caution in cooking, use of household electrical appliances and climbing steps. Driving: If you have received any sedation, you are not allowed to drive for 24 hours after your procedure. Blood thinner: Restart your blood thinner 6 hours after your procedure. (Only for those taking blood thinners) Insulin: As soon as you can eat, you may resume your normal dosing schedule. (Only for those taking insulin) Medications: May resume pre-procedure medications.  Do not take any drugs, other than what has been prescribed to you. Infection prevention: Keep procedure site clean and dry. Post-procedure Pain Diary: Extremely important that this be done correctly and accurately. Recorded information will be used to determine the next step in treatment. Pain evaluated is that of treated area only. Do not include pain from an untreated area. Complete every hour, on the hour, for the initial 8 hours. Set an alarm to help you do this part accurately. Do not go to sleep and have it completed later. It will not be accurate. Follow-up appointment: Keep your follow-up appointment after the procedure. Usually 2-6 weeks after radiofrequency. Bring you pain diary. The information collected will be essential for your long-term care.   Expect: From numbing medicine (AKA: Local Anesthetics): Numbness or decrease in pain. Onset: Full effect within 15 minutes of injected. Duration: It will depend on the type of local anesthetic used. On the average, 1 to 8 hours.  From steroids (when added): Decrease in swelling or inflammation. Once inflammation is improved, relief of the pain will follow. Onset of benefits:  Depends on the amount of swelling present. The more swelling, the longer it will take for the benefits to be seen. In some cases, up to 10 days. Duration: Steroids will stay in the system x 2 weeks. Duration of benefits will depend on multiple posibilities including persistent irritating factors. From procedure: Some discomfort is to be expected once the numbing medicine wears off. In the case of radiofrequency procedures, this may last as long as 6-8 weeks. Additional post-procedure pain medication is provided for this. Discomfort is minimized if ice and heat are applied as instructed.  Call if: You experience numbness and weakness that gets worse with time, as opposed to wearing off. He experience any unusual bleeding, difficulty breathing, or loss of the ability to control your bowel and bladder. (This applies to Spinal procedures only) You experience any redness, swelling, heat, red streaks, elevated temperature, fever, or any other signs of a possible infection.  Emergency Numbers: Durning business hours (Monday - Thursday, 8:00 AM - 4:00 PM) (Friday, 9:00 AM - 12:00 Noon): [phone] After hours: [phone] ______________________________________________________________________     ______________________________________________________________________    Steroid injections  Common steroids for injections Triamcinolone : Used by many sports medicine physicians for large joint and bursal injections, often combined with a local anesthetic like lidocaine . A study focusing on coccydynia (tailbone pain) found triamcinolone  was more effective than betamethasone, suggesting it may also be  preferable for other localized inflammation conditions. Methylprednisolone : A common alternative to triamcinolone  that is also a strong anti-inflammatory. It is available in different formulations, with the acetate suspension being the long-acting option for intra-articular injections. Dexamethasone : This  is a non-particulate steroid, meaning it has a lower risk of tissue damage compared to particulate steroids like triamcinolone  and methylprednisolone . While less common for this specific use, it is an option for targeted injections.   Considerations for physicians Particulate vs. non-particulate steroids: Triamcinolone  and methylprednisolone  are particulate, meaning they can clump together. Dexamethasone  is non-particulate. Particulate steroids are often preferred for their longer-lasting effects but carry a theoretical higher risk for certain injections (though this is less of a concern in the costochondral joints). Combined injectate: Corticosteroids are typically mixed with a local anesthetic like lidocaine  to provide both immediate pain relief (from the anesthetic) and longer-term inflammation reduction (from the steroid). Imaging guidance: To ensure accurate placement of the needle and medication, physicians may use ultrasound or fluoroscopic guidance for the injection, especially in complex or refractory cases.   Patient guidance Before undergoing a steroid injection, discuss the options with your physician. They will determine the best steroid, dosage, and procedure for your specific case based on factors like: Severity of your condition History of response to other treatments Your overall health status Experience and preference of the physician  Last  Updated: 03/09/2024 ______________________________________________________________________     ______________________________________________________________________    Patient information on: Body mass index (BMI) and Weight Management  Dear Danielle [name] you are receiving this information because your weight may be adversely affecting your health.   Your current Estimated body mass index is 38.94 kg/m as calculated from the following:   Height as of 04/29/24: 5' 5 (1.651 m).   Weight as of 04/29/24: 234 lb (106.1 kg).  We recommend  you talk to your primary care physician about providing or referring you to a supervised weight management program.  Here is some information about weight and the body mass index (BMI) classification:  BMI is a measure of obesity that's calculated by dividing a person's weight in kilograms by their height in meters squared. A person can use an online calculator to determine their BMI. Body mass index (BMI) is a common tool for deciding whether a person has an appropriate body weight.  It measures a person's weight in relation to their height.  According to the [facility] of health (NIH): A BMI of less than 18.5 means that a person is underweight. A BMI of between 18.5 and 24.9 is ideal. A BMI of between 25 and 29.9 is overweight. A BMI over 30 indicates obesity.  Body Mass Index (BMI) Classification BMI level (kg/m2) Category Associated incidence of chronic pain  <18  Underweight   18.5-24.9 Ideal body weight   25-29.9 Overweight  20%  30-34.9 Obese (Class I)  68%  35-39.9 Severe obesity (Class II)  136%  >40 Extreme obesity (Class III)  254%    Morbidly Obese Classification: You will be considered to be Morbidly Obese if your BMI is above 30 and you have one or more of the following conditions caused or associated to obesity: 1.    Type 2 Diabetes (Leading to cardiovascular diseases (CVD), stroke, peripheral vascular diseases (PVD), retinopathy, nephropathy, and neuropathy) 2.    Cardiovascular Disease (High Blood Pressure; Congestive Heart Failure; High Cholesterol; Coronary Artery Disease; Angina; Arrhythmias, Dysrhythmias, or Heart Attacks) 3.    Breathing problems (Asthma; obesity-hypoventilation syndrome; obstructive sleep apnea; chronic inflammatory airway  disease; reactive airway disease; or shortness of breath) 4.    Chronic kidney disease 5.    Liver disease (nonalcoholic fatty liver disease) 6.    High blood pressure 7.    Acid reflux (gastroesophageal reflux disease;  heartburn) 8.    Osteoarthritis (OA) (affecting the hip(s), the knee(s) and/or the lower back) (usually requiring knee and/or hip replacements, as well as back surgeries) 9.    Low back pain (Lumbar Facet Syndrome; and/or Degenerative Disc Disease) 10.  Hip pain (Osteoarthritis of hip) (For every 1 lbs of added body weight, there is a 2 lbs increase in pressure inside of each hip articulation. 1:2 mechanical relationship) 11.  Knee pain (Osteoarthritis of knee) (For every 1 lbs of added body weight, there is a 4 lbs increase in pressure inside of each knee articulation. 1:4 mechanical relationship) (patients with a BMI>30 kg/m2 were 6.8 times more likely to develop knee OA than normal-weight individuals) 12.  Cancer: Epidemiological studies have shown that obesity is a risk factor for: post-menopausal breast cancer; cancers of the endometrium, colon and kidney cancer; malignant adenomas of the esophagus. Obese subjects have an approximately 1.5-3.5-fold increased risk of developing these cancers compared with normal-weight subjects, and it has been estimated that between 15 and 45% of these cancers can be attributed to overweight. More recent studies suggest that obesity may also increase the risk of other types of cancer, including pancreatic, hepatic and gallbladder cancer. (Ref: Obesity and cancer. Pischon T, Nthlings U, [employer] H. Proc Nutr Soc. 2008 May;67(2):128-45. doi: 10.1017/S0029665108006976.) The International Agency for Research on Cancer (IARC) has identified 13 cancers associated with overweight and obesity: meningioma, multiple myeloma, adenocarcinoma of the esophagus, and cancers of the thyroid, postmenopausal breast cancer, gallbladder, stomach, liver, pancreas, kidney, ovaries, uterus, colon and rectal (colorectal) cancers. 55 percent of all cancers diagnosed in women and 24 percent of those diagnosed in men are associated with overweight and obesity.  Recommendation: If you have any of the  above conditions it is urgent that you take a step back and concentrate in losing weight. Dedicate 100% of your efforts on this task. Nothing else will improve your health more than bringing your weight down and your BMI to less than 30.   Nutritionist and/or supervised weight-management program: We are aware that most chronic pain patients are unable to exercise secondary to their pain. For this reason, you must rely on proper nutrition and diet in order to lose the weight. We recommend you talk to a nutritionist.   Bariatric surgery: A person might be considered a candidate for bariatric surgery if they meet one of the following BMI criteria:  BMI of 40 or higher: This is considered extreme obesity (Class III). BMI of 35-39.9: This is considered obesity, and the person might also have a serious weight-related health condition, such as high blood pressure, type 2 diabetes, or severe sleep apnea  BMI of 30-34.9: This might be considered if the person has serious weight-related health problems and hasn't had substantial weight loss or improvement in co-morbidities through other methods   On your own: A realistic goal is to lose 10% of your body weight over a period of 12 months.  If over a period of six (6) months you have unsuccessfully tried to lose weight, then it is time for you to seek professional help and to enter a medically supervised weight management program, and/or undergo bariatric surgery.   Pain management considerations and possible limitations:  1.    Pharmacological  Problems: Be advised that the use of opioid analgesics (oxycodone ; hydrocodone ; morphine; methadone; codeine ; and all of their derivatives) have been associated with decreased metabolism and weight gain.  For this reason, should we see that you are unable to lose weight while taking these medications, it may become necessary for us  to taper down and indefinitely discontinue them.  2.    Technical Problems: The incidence of  successful interventional therapies decreases as the patient's BMI increases. It is much more difficult to accomplish a safe and effective interventional therapy on a patient with a BMI above 35. 3.    Radiation Exposure Problems: The x-rays machine, used to accomplish injection therapies, will automatically increase their x-ray output in order to capture an appropriate bone image. This means that radiation exposure increases exponentially with the patient's BMI. (The higher the BMI, the higher the radiation exposure.) Although the level of radiation used at a given time is still safe to the patient, it is not for the physician and/or assisting staff. Unfortunately, radiation exposure is accumulative. Because physicians and the staff have to do procedures and be exposed on a daily basis, this can result in health problems such as cancer and radiation burns. Radiation exposure to the staff is monitored by the radiation batches that they wear. The exposure levels are reported back to the staff on a quarterly basis. Depending on levels of exposure, physicians and staff may be obligated by law to decrease this exposure. This means that they have the right and obligation to refuse providing therapies where they may be overexposed to radiation. For this reason, physicians may decline to offer therapies such as radiofrequency ablation or implants to patients with a BMI above 40. 4.    Current Trends: Be advised that the current trend is to no longer offer certain therapies to patients with a BMI equal to, or above 35, due to increase perioperative risks, increased technical procedural difficulties, and excessive radiation exposure to healthcare personnel.  Last updated: 04/07/2023 ______________________________________________________________________

## 2024-05-13 NOTE — Progress Notes (Signed)
 1050 Ancef  1 gram IVPB

## 2024-05-13 NOTE — Progress Notes (Signed)
 PROVIDER NOTE: Interpretation of information contained herein should be left to medically-trained personnel. Specific patient instructions are provided elsewhere under Patient Instructions section of medical record. This document was created in part using STT-dictation technology, any transcriptional errors that may result from this process are unintentional.  Patient: Danielle [name] Type: Established DOB: 12-08-1960 MRN: [id number] PCP: Alyse Bradley, MD  Service: Procedure DOS: 05/13/2024 Setting: Ambulatory Location: Ambulatory outpatient facility Delivery: Face-to-face Provider: Eric DELENA Como, MD Specialty: Interventional Pain Management Specialty designation: 09 Location: Outpatient facility Ref. Prov.: Alyse Bradley, MD       Interventional Therapy   Procedure: Lumbar Facet, Medial Branch Radiofrequency Ablation (RFA) #3  Laterality: Left (-LT)  Level: L2, L3, L4, L5, and S1 Medial Branch Level(s). These levels will denervate the L3-4, L4-5, and L5-S1 lumbar facet joints.  Imaging: Fluoroscopy-guided Spinal ([id number]) Anesthesia: Local anesthesia (1-2% Lidocaine ) Anxiolysis: IV Versed  2.5 mg Sedation: Minimal Sedation Fentanyl  2.0 mL (100 mcg) DOS: 05/13/2024  Performed by: Eric DELENA Como, MD  Purpose: Therapeutic/Palliative Indications: Low back pain severe enough to impact quality of life or function. Indications: 1. Chronic low back pain (Bilateral) (R>L) w/o sciatica   2. Lumbar facet joint pain   3. Lumbar facet syndrome (Bilateral) (R>L)   4. Osteoarthritis of facet joint of lumbar spine   5. Spondylosis without myelopathy or radiculopathy, lumbosacral region   6. Failed back surgical syndrome   7. History of thrush   8. History of multiple allergies    Danielle [name] has been dealing with the above chronic pain for longer than three months and has either failed to respond, was unable to tolerate, or simply did not get enough benefit from other more  conservative therapies including, but not limited to: 1. Over-the-counter medications 2. Anti-inflammatory medications 3. Muscle relaxants 4. Membrane stabilizers 5. Opioids 6. Physical therapy and/or chiropractic manipulation 7. Modalities (Heat, ice, etc.) 8. Invasive techniques such as nerve blocks. Danielle [name] has attained more than 50% relief of the pain from a series of diagnostic injections conducted in separate occasions.  Pain Score: Pre-procedure: 2 /10 Post-procedure: 3 /10     Position / Prep / Materials:  Position: Prone  Prep solution: ChloraPrep (2% chlorhexidine  gluconate and 70% isopropyl alcohol) Prep Area: Entire Lumbosacral Region (Lower back from mid-thoracic region to end of tailbone and from flank to flank.) Materials:  Tray: RFA (Radiofrequency) tray Needle(s):  Type: RFA (Teflon-coated radiofrequency ablation needles) Gauge (G): 20  Length: Long (15cm) Qty: 5     Post-Procedure Evaluation   Procedure: Lumbar Facet, Medial Branch Radiofrequency Ablation (RFA) #3  Laterality: Right (-RT)  Level: L2, L3, L4, L5, and S1 Medial Branch Level(s). These levels will denervate the L3-4, L4-5, and L5-S1 lumbar facet joints.  Imaging: Fluoroscopy-guided Spinal ([id number]) Anesthesia: Local anesthesia (1-2% Lidocaine ) Anxiolysis: IV Versed  3.0 mg Sedation: Minimal Sedation Fentanyl  2.0 mL (100 mcg) DOS: 04/29/2024  Performed by: Eric DELENA Como, MD  Purpose: Therapeutic/Palliative Indications: Low back pain severe enough to impact quality of life or function. Indications: 1. Chronic low back pain (Bilateral) (R>L) w/o sciatica   2. Chronic hip pain (Right)   3. Chronic groin pain (Bilateral)   4. Lumbar facet joint pain   5. Lumbar facet syndrome (Bilateral) (R>L)   6. Osteoarthritis of facet joint of lumbar spine   7. Spondylosis without myelopathy or radiculopathy, lumbosacral region   8. Failed back surgical syndrome    History of multiple  allergies    History of allergy to iodine &  radiographic dye    Morbid obesity with BMI of 40.0-44.9, adult (HCC)    Procedure #2:    Type: Diagnostic Wrist Steroid Injection  #1  Region: Dorsal Lunate area Level: Wrist Laterality: Right     1. Chronic wrist pain (Right)   2. Arthritis of carpometacarpal (CMC) joint of thumb (Right)     Pain Score: Pre-procedure: 3 /10 Post-procedure: 4 /10    Effectiveness:  Initial hour after procedure: 100 %. Subsequent 4-6 hours post-procedure: 100 %. Analgesia past initial 6 hours: 45 % (2 WEEKS POST PROCEDURE). Ongoing improvement:  Analgesic: The patient indicates having attained 100% relief of the pain for the duration of local anesthetic followed by a decrease to a 45% ongoing improvement.  However, she is only on her second week pulsed radiofrequency ablation and she still has another 4 weeks to go. Function: Danielle [name] reports improvement in function ROM: Danielle [name] reports improvement in ROM   H&P (Pre-op Assessment):  Danielle [name] is a 63 y.o. (year old), female patient, seen today for interventional treatment. 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 Danielle [name] has a current medication list which includes the following prescription(s): albuterol , aspirin , cetirizine, vitamin d3, citalopram , cyclobenzaprine , dextromethorphan-guaifenesin , docusate sodium , fluconazole , fluticasone , ipratropium-albuterol , levothyroxine , losartan , menthol  (topical analgesic), oxycodone , [START ON 05/20/2024] oxycodone , spiriva  handihaler, and symbicort, and the following Facility-Administered Medications: fentanyl . Her primarily concern today is the Back Pain  Initial  Vital Signs:  Pulse/HCG Rate: 92ECG Heart Rate: 75 (nsr) Temp: (!) 97.2 F (36.2 C) Resp: 18 BP: 120/79 SpO2: 99 %  BMI: Estimated body mass index is 38.94 kg/m as calculated from the following:   Height as of this encounter: 5' 5 (1.651 m).   Weight as of this encounter: 234 lb (106.1 kg).  Risk Assessment: Allergies: Reviewed. She is allergic to aripiprazole, duloxetine, gabapentin, gold, iodinated contrast media, naproxen sodium, nortriptyline hcl, nsaids, pregabalin, acetaminophen , meperidine, ace inhibitors, aspirin , cefpodoxime, fluoxetine, fluticasone -salmeterol, levofloxacin, lithium, paroxetine, tape, telithromycin, theophylline, topiramate, trazodone, triamcinolone , venlafaxine, bupropion, ketorolac  tromethamine , morphine, and moxifloxacin.  Allergy Precautions: None required Coagulopathies: Reviewed. None identified.  Blood-thinner therapy: None at this time Active Infection(s): Reviewed. None identified. Ms. Tout is afebrile  Site Confirmation: Ms. Sarracino was asked to confirm the procedure and laterality before marking the site Procedure checklist: Completed Consent: Before the procedure and under the influence of no sedative(s), amnesic(s), or anxiolytics, the patient was informed of the treatment options, risks and possible complications. To fulfill our ethical and legal obligations, as recommended by the American Medical Association's Code of Ethics, I have informed the patient of my clinical impression; the nature and purpose of the treatment or procedure; the risks, benefits, and possible complications of the intervention; the alternatives, including doing nothing; the risk(s) and benefit(s) of the alternative treatment(s) or procedure(s); and the risk(s) and benefit(s) of doing nothing. The patient was provided information about the general risks and possible complications associated with the procedure. These may include, but are not limited to: failure to achieve  desired goals, infection, bleeding, organ or nerve damage, allergic reactions, paralysis, and death. In addition, the patient was informed of those risks and complications associated to Spine-related procedures, such as failure to decrease pain; infection (i.e.: Meningitis, epidural or intraspinal abscess); bleeding (i.e.: epidural hematoma, subarachnoid hemorrhage, or  any other type of intraspinal or peri-dural bleeding); organ or nerve damage (i.e.: Any type of peripheral nerve, nerve root, or spinal cord injury) with subsequent damage to sensory, motor, and/or autonomic systems, resulting in permanent pain, numbness, and/or weakness of one or several areas of the body; allergic reactions; (i.e.: anaphylactic reaction); and/or death. Furthermore, the patient was informed of those risks and complications associated with the medications. These include, but are not limited to: allergic reactions (i.e.: anaphylactic or anaphylactoid reaction(s)); adrenal axis suppression; blood sugar elevation that in diabetics may result in ketoacidosis or comma; water retention that in patients with history of congestive heart failure may result in shortness of breath, pulmonary edema, and decompensation with resultant heart failure; weight gain; swelling or edema; medication-induced neural toxicity; particulate matter embolism and blood vessel occlusion with resultant organ, and/or nervous system infarction; and/or aseptic necrosis of one or more joints. Finally, the patient was informed that Medicine is not an exact science; therefore, there is also the possibility of unforeseen or unpredictable risks and/or possible complications that may result in a catastrophic outcome. The patient indicated having understood very clearly. We have given the patient no guarantees and we have made no promises. Enough time was given to the patient to ask questions, all of which were answered to the patient's satisfaction. Ms. Mccormack has  indicated that she wanted to continue with the procedure. Attestation: I, the ordering provider, attest that I have discussed with the patient the benefits, risks, side-effects, alternatives, likelihood of achieving goals, and potential problems during recovery for the procedure that I have provided informed consent. Date  Time: 05/13/2024  9:54 AM  Pre-Procedure Preparation:  Monitoring: As per clinic protocol. Respiration, ETCO2, SpO2, BP, heart rate and rhythm monitor placed and checked for adequate function Safety Precautions: Patient was assessed for positional comfort and pressure points before starting the procedure. Time-out: I initiated and conducted the Time-out before starting the procedure, as per protocol. The patient was asked to participate by confirming the accuracy of the Time Out information. Verification of the correct person, site, and procedure were performed and confirmed by me, the nursing staff, and the patient. Time-out conducted as per Joint Commission's Universal Protocol ([id number].01.01.01). Time: 1148 Start Time: 1148 hrs.  Description of Procedure:          Laterality: See above. Levels:  See above. Safety Precautions: Aspiration looking for blood return was conducted prior to all injections. At no point did we inject any substances, as a needle was being advanced. Before injecting, the patient was told to immediately notify me if she was experiencing any new onset of ringing in the ears, or metallic taste in the mouth. No attempts were made at seeking any paresthesias. Safe injection practices and needle disposal techniques used. Medications properly checked for expiration dates. SDV (single dose vial) medications used. After the completion of the procedure, all disposable equipment used was discarded in the proper designated medical waste containers. Local Anesthesia: Protocol guidelines were followed. The patient was positioned over the fluoroscopy table. The area  was prepped in the usual manner. The time-out was completed. The target area was identified using fluoroscopy. A 12-in long, straight, sterile hemostat was used with fluoroscopic guidance to locate the targets for each level blocked. Once located, the skin was marked with an approved surgical skin marker. Once all sites were marked, the skin (epidermis, dermis, and hypodermis), as well as deeper tissues (fat, connective tissue and muscle) were infiltrated with a small amount of a  short-acting local anesthetic, loaded on a 10cc syringe with a 25G, 1.5-in  Needle. An appropriate amount of time was allowed for local anesthetics to take effect before proceeding to the next step. Technical description of process:  Radiofrequency Ablation (RFA) L2 Medial Branch Nerve RFA: The target area for the L2 medial branch is at the junction of the postero-lateral aspect of the superior articular process and the superior, posterior, and medial edge of the transverse process of L3. Under fluoroscopic guidance, a Radiofrequency needle was inserted until contact was made with os over the superior postero-lateral aspect of the pedicular shadow (target area). Sensory and motor testing was conducted to properly adjust the position of the needle. Once satisfactory placement of the needle was achieved, the numbing solution was slowly injected after negative aspiration for blood. 2.0 mL of the nerve block solution was injected without difficulty or complication. After waiting for at least 3 minutes, the ablation was performed. Once completed, the needle was removed intact. L3 Medial Branch Nerve RFA: The target area for the L3 medial branch is at the junction of the postero-lateral aspect of the superior articular process and the superior, posterior, and medial edge of the transverse process of L4. Under fluoroscopic guidance, a Radiofrequency needle was inserted until contact was made with os over the superior postero-lateral aspect of  the pedicular shadow (target area). Sensory and motor testing was conducted to properly adjust the position of the needle. Once satisfactory placement of the needle was achieved, the numbing solution was slowly injected after negative aspiration for blood. 2.0 mL of the nerve block solution was injected without difficulty or complication. After waiting for at least 3 minutes, the ablation was performed. Once completed, the needle was removed intact. L4 Medial Branch Nerve RFA: The target area for the L4 medial branch is at the junction of the postero-lateral aspect of the superior articular process and the superior, posterior, and medial edge of the transverse process of L5. Under fluoroscopic guidance, a Radiofrequency needle was inserted until contact was made with os over the superior postero-lateral aspect of the pedicular shadow (target area). Sensory and motor testing was conducted to properly adjust the position of the needle. Once satisfactory placement of the needle was achieved, the numbing solution was slowly injected after negative aspiration for blood. 2.0 mL of the nerve block solution was injected without difficulty or complication. After waiting for at least 3 minutes, the ablation was performed. Once completed, the needle was removed intact. L5 Medial Branch Nerve RFA: The target area for the L5 medial branch is at the junction of the postero-lateral aspect of the superior articular process of S1 and the superior, posterior, and medial edge of the sacral ala. Under fluoroscopic guidance, a Radiofrequency needle was inserted until contact was made with os over the superior postero-lateral aspect of the pedicular shadow (target area). Sensory and motor testing was conducted to properly adjust the position of the needle. Once satisfactory placement of the needle was achieved, the numbing solution was slowly injected after negative aspiration for blood. 2.0 mL of the nerve block solution was injected  without difficulty or complication. After waiting for at least 3 minutes, the ablation was performed. Once completed, the needle was removed intact. S1 Medial Branch Nerve RFA: The target area for the S1 medial branch is located inferior to the junction of the S1 superior articular process and the L5 inferior articular process, posterior, inferior, and lateral to the 6 o'clock position of the L5-S1 facet joint,  just superior to the S1 posterior foramen. Under fluoroscopic guidance, the Radiofrequency needle was advanced until contact was made with os over the Target area. Sensory and motor testing was conducted to properly adjust the position of the needle. Once satisfactory placement of the needle was achieved, the numbing solution was slowly injected after negative aspiration for blood. 2.0 mL of the nerve block solution was injected without difficulty or complication. After waiting for at least 3 minutes, the ablation was performed. Once completed, the needle was removed intact. Radiofrequency lesioning (ablation):  Radiofrequency Generator: Medtronic AccurianTM AG 1000 RF Generator Sensory Stimulation Parameters: 50 Hz was used to locate & identify the nerve, making sure that the needle was positioned such that there was no sensory stimulation below 0.3 V or above 0.7 V. Motor Stimulation Parameters: 2 Hz was used to evaluate the motor component. Care was taken not to lesion any nerves that demonstrated motor stimulation of the lower extremities at an output of less than 2.5 times that of the sensory threshold, or a maximum of 2.0 V. Lesioning Technique Parameters: Standard Radiofrequency settings. (Not bipolar or pulsed.) Temperature Settings: 80 degrees C Lesioning time: 60 seconds Stationary intra-operative compliance: Compliant  Once the entire procedure was completed, the treated area was cleaned, making sure to leave some of the prepping solution back to take advantage of its long term  bactericidal properties.    Illustration of the posterior view of the lumbar spine and the posterior neural structures. Laminae of L2 through S1 are labeled. DPRL5, dorsal primary ramus of L5; DPRS1, dorsal primary ramus of S1; DPR3, dorsal primary ramus of L3; FJ, facet (zygapophyseal) joint L3-L4; I, inferior articular process of L4; LB1, lateral branch of dorsal primary ramus of L1; IAB, inferior articular branches from L3 medial branch (supplies L4-L5 facet joint); IBP, intermediate branch plexus; MB3, medial branch of dorsal primary ramus of L3; NR3, third lumbar nerve root; S, superior articular process of L5; SAB, superior articular branches from L4 (supplies L4-5 facet joint also); TP3, transverse process of L3.  Facet Joint Innervation (* possible contribution)  L1-2 T12, L1 (L2*)  Medial Branch  L2-3 L1, L2 (L3*)                     L3-4 L2, L3 (L4*)                     L4-5 L3, L4 (L5*)                     L5-S1 L4, L5, S1                        Vitals:   05/13/24 1215 05/13/24 1225 05/13/24 1235 05/13/24 1244  BP: 134/81 123/69 113/69 118/74  Pulse:      Resp: 18 18 16 18   Temp:      SpO2: 100% 98% 98% 99%  Weight:      Height:        Start Time: 1148 hrs. End Time: 1215 hrs.  Imaging Guidance (Spinal):         Type of Imaging Technique: Fluoroscopy Guidance (Spinal) Indication(s): Fluoroscopy guidance for needle placement to enhance accuracy in procedures requiring precise needle localization for targeted delivery of medication in or near specific anatomical locations not easily accessible without such real-time imaging assistance. Exposure Time: Please see nurses notes. Contrast: None used. Fluoroscopic Guidance: I was personally present during the  use of fluoroscopy. Tunnel Vision Technique used to obtain the best possible view of the target area. Parallax error corrected before commencing the procedure. Direction-depth-direction technique used to introduce  the needle under continuous pulsed fluoroscopy. Once target was reached, antero-posterior, oblique, and lateral fluoroscopic projection used confirm needle placement in all planes. Images permanently stored in EMR. Interpretation: No contrast injected. I personally interpreted the imaging intraoperatively. Adequate needle placement confirmed in multiple planes. Permanent images saved into the patient's record.  Antibiotic Prophylaxis:   Anti-infectives (From admission, onward)    Start     Dose/Rate Route Frequency Ordered Stop   05/13/24 1100  ceFAZolin  (ANCEF ) IVPB 1 g/50 mL premix        1 g 100 mL/hr over 30 Minutes Intravenous  Once 05/13/24 1054 05/13/24 1115   05/13/24 0000  fluconazole  (DIFLUCAN ) 150 MG tablet        150 mg Oral Once PRN 05/13/24 0956        Indication(s): None identified  Post-operative Assessment:  Post-procedure Vital Signs:  Pulse/HCG Rate: 9276 Temp: (!) 97.2 F (36.2 C) Resp: 18 BP: 118/74 SpO2: 99 %  EBL: None  Complications: No immediate post-treatment complications observed by team, or reported by patient.  Note: The patient tolerated the entire procedure well. A repeat set of vitals were taken after the procedure and the patient was kept under observation following institutional policy, for this type of procedure. Post-procedural neurological assessment was performed, showing return to baseline, prior to discharge. The patient was provided with post-procedure discharge instructions, including a section on how to identify potential problems. Should any problems arise concerning this procedure, the patient was given instructions to immediately contact us , at any time, without hesitation. In any case, we plan to contact the patient by telephone for a follow-up status report regarding this interventional procedure.  Comments:  No additional relevant information.  Plan of Care (POC)  Orders:  Orders Placed This Encounter  Procedures    Radiofrequency,Lumbar    Scheduling Instructions:     Side(s): Left-sided     Level: L3-4, L4-5, and L5-S1 Facets (L2, L3, L4, L5, and S1 Medial Branch)     Sedation: With Sedation.     Date: 05/13/2024    Where will this procedure be performed?:   [facility]   [facility] C-ARM 1-60 MIN NO REPORT    Intraoperative interpretation by procedural physician at [facility].    Standing Status:   Standing    Number of Occurrences:   1    Reason for exam::   Assistance in needle guidance and placement for procedures requiring needle placement in or near specific anatomical locations not easily accessible without such assistance.   Nursing Instructions:    Please complete this patient's postprocedure evaluation.    Scheduling Instructions:     Please complete this patient's postprocedure evaluation.   Informed Consent Details: Physician/Practitioner Attestation; Transcribe to consent form and obtain patient signature    Nursing Order: Transcribe to consent form and obtain patient signature. Note: Always confirm laterality of pain with Ms. Oneita, before procedure.    Physician/Practitioner attestation of informed consent for procedure/surgical case:   I, the physician/practitioner, attest that I have discussed with the patient the benefits, risks, side effects, alternatives, likelihood of achieving goals and potential problems during recovery for the procedure that I have provided informed consent.    Procedure:   Lumbar Facet Radiofrequency Ablation    Physician/Practitioner performing the procedure:  Chelse Matas A. Tanya, MD    Indication/Reason:   Low Back Pain, with our without leg pain, due to Facet Joint Arthralgia (Joint Pain) known as Lumbar Facet Syndrome, secondary to Lumbar, and/or Lumbosacral Spondylosis (Arthritis of the Spine), without myelopathy or radiculopathy (Nerve Damage).   Provide equipment / supplies at bedside    Procedure tray: Radiofrequency  Tray Additional material: Large hemostat (x1); Small hemostat (x1); Towels (x8); 4x4 sterile sponge pack (x1) Needle type: Teflon-coated Radiofrequency Needle (Disposable  single use) Size: Long Quantity: 5    Standing Status:   Standing    Number of Occurrences:   1    Specify:   Radiofrequency Tray   Saline lock IV    Have LR [phone] mL available and administer at 125 mL/hr if patient becomes hypotensive.    Standing Status:   Standing    Number of Occurrences:   1    Opioid Analgesic: No chronic opioid analgesics therapy prescribed by our practice.  Discontinued (11/21/2021) UDS. (+) COCAINE.   Medications ordered for procedure: Meds ordered this encounter  Medications   lidocaine  (XYLOCAINE ) 2 % (with pres) injection 400 mg   pentafluoroprop-tetrafluoroeth (GEBAUERS) aerosol   midazolam  (VERSED ) 5 MG/5ML injection 0.5-2 mg    Make sure Flumazenil is available in the pyxis when using this medication. If oversedation occurs, administer 0.2 mg IV over 15 sec. If after 45 sec no response, administer 0.2 mg again over 1 min; may repeat at 1 min intervals; not to exceed 4 doses (1 mg)   fentaNYL  (SUBLIMAZE ) injection 25-50 mcg    Make sure Narcan  is available in the pyxis when using this medication. In the event of respiratory depression (RR< 8/min): Titrate NARCAN  (naloxone ) in increments of 0.1 to 0.2 mg IV at 2-3 minute intervals, until desired degree of reversal.   ropivacaine  (PF) 2 mg/mL (0.2%) (NAROPIN ) injection 9 mL   triamcinolone  acetonide (KENALOG -40) injection 40 mg   fluconazole  (DIFLUCAN ) 150 MG tablet    Sig: Take 1 tablet (150 mg total) by mouth once as needed for up to 1 dose.    Dispense:  2 tablet    Refill:  0   oxyCODONE  (OXY IR/ROXICODONE ) 5 MG immediate release tablet    Sig: Take 1 tablet (5 mg total) by mouth every 6 (six) hours as needed for up to 7 days for severe pain (pain score 7-10). Must last 30 days.    Dispense:  28 tablet    Refill:  0    oxyCODONE  (OXY IR/ROXICODONE ) 5 MG immediate release tablet    Sig: Take 1 tablet (5 mg total) by mouth every 6 (six) hours as needed for up to 7 days for severe pain (pain score 7-10). Must last 30 days.    Dispense:  28 tablet    Refill:  0   ceFAZolin  (ANCEF ) IVPB 1 g/50 mL premix    Antibiotic Indication::   Surgical Prophylaxis    Other Indication::   Procedure Prophylaxis   Medications administered: We administered lidocaine , pentafluoroprop-tetrafluoroeth, midazolam , fentaNYL , ropivacaine  (PF) 2 mg/mL (0.2%), triamcinolone  acetonide, and ceFAZolin .  See the medical record for exact dosing, route, and time of administration.    Interventional Therapies  Risk Factors  Considerations:   Allergy (Anaphylactic-type): Contrast dye.  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 (+) COCAINE.   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) (04/29/2024)    Under consideration:   Therapeutic left IA hip injection + TBI #3  Therapeutic bilateral lumbar facet RFA #3 (starting with the right side and following up with the left 2 weeks later)   Completed:   Palliative left trochanteric bursa x2 (09/18/2021) (100/100/90)  Palliative right trochanteric bursa x4 (09/17/2022) (100/100/90/70)  Palliative right suprascapular NB x3 (08/08/2020) (50/50/100/90-100)   Palliative left suprascapular NB x4 (02/22/2021) (100/100/90/90)  Palliative bilateral suprascapular nerve RFA x1 (05/13/2019) (100/100/90/90-100)  Therapeutic right IA hip joint injection x6 (09/17/2022) (100/100/70/70)  Therapeutic left IA hip joint injection x4 (09/18/2021) (100/100/90)  Diagnostic bilateral lumbar facet MBB x6 (01/15/2024) (100/100/100 x 3 days/40)  Palliative right lumbar facet RFA x2 (12/16/2017) (100/100/85)   Palliative left lumbar facet RFA x2 (02/12/2018) [facility])  Diagnostic right cervical facet MBB x2 (04/11/2022) (100/100/100/100)  Diagnostic left cervical facet MBB x1 (09/30/2016) (90/100/0)  Diagnostic right SI joint block x1 (06/12/2017) (DNKFU -0/0/80)    Therapeutic  Palliative (PRN) options:  Therapeutic suprascapular nerve RFA  Therapeutic lumbar facet RFA    Pharmacotherapy  Nonopioids transferred 05/15/2020: Zanaflex  and Voltaren  gel       Follow-up plan:   Return in about 6 weeks (around 06/24/2024) for (Face2F), (PPE) s/p RFA.     Recent Visits Date Type Provider Dept  04/29/24 Procedure visit Tanya Glisson, MD [facility]  03/01/24 Office Visit Tanya Glisson, MD [facility]  Showing recent visits within past 90 days and meeting all other requirements Today's Visits Date Type Provider Dept  05/13/24 Procedure visit Tanya Glisson, MD [facility]  Showing today's visits and meeting all other requirements Future Appointments Date Type Provider Dept  05/19/24 Appointment Tanya Glisson, MD [facility]  06/23/24 Appointment Tanya Glisson, MD [facility]  Showing future appointments within next 90 days and meeting all other requirements   Disposition: Discharge home  Discharge (Date  Time): 05/13/2024; 1245 hrs.   Primary Care Physician: Alyse Bradley, MD Location: [facility] Note by: Glisson DELENA Tanya, MD (TTS technology used. I apologize for any typographical errors that were not detected and corrected.) Date: 05/13/2024; Time: 12:47 PM  Disclaimer:  Medicine is not an visual merchandiser. The only guarantee in medicine is that nothing is guaranteed. It is important to note that the decision to proceed with this intervention was based on the information collected from the patient. The Data and conclusions were drawn from the patient's questionnaire, the interview, and  the physical examination. Because the information was provided in large part by the patient, it cannot be guaranteed that it has not been purposely or unconsciously manipulated. Every effort has been made to obtain as much relevant data as possible for this evaluation. It is important to note that the conclusions that lead to this procedure are derived in large part from the available data. Always take into account that the treatment will also be dependent on availability of resources and existing treatment guidelines, considered by other Pain Management Practitioners as being common knowledge and practice, at the time of the intervention. For Medico-Legal purposes, it is also important to point out that variation in procedural techniques and pharmacological choices are the acceptable norm. The indications, contraindications, technique, and results of the above procedure should only be interpreted and judged by a Board-Certified Interventional Pain Specialist with extensive familiarity and expertise in the same exact procedure and technique.

## 2024-05-13 NOTE — Progress Notes (Signed)
 Safety precautions to be maintained throughout the outpatient stay will include: orient to surroundings, keep bed in low position, maintain call bell within reach at all times, provide assistance with transfer out of bed and ambulation.

## 2024-05-14 ENCOUNTER — Telehealth: Payer: Self-pay

## 2024-05-14 NOTE — Telephone Encounter (Signed)
 Post procedure follow up.  Patient states she is doing good.

## 2024-05-17 ENCOUNTER — Encounter: Payer: Self-pay | Admitting: Radiology

## 2024-05-19 ENCOUNTER — Ambulatory Visit: Admitting: Pain Medicine

## 2024-06-16 ENCOUNTER — Ambulatory Visit: Admitting: Orthopaedic Surgery

## 2024-06-22 NOTE — Progress Notes (Unsigned)
 PROVIDER NOTE: Interpretation of information contained herein should be left to medically-trained personnel. Specific patient instructions are provided elsewhere under Patient Instructions section of medical record. This document was created in part using AI and STT-dictation technology, any transcriptional errors that may result from this process are unintentional.  Patient: Danielle [name]  Service: E/M   PCP: Alyse Bradley, MD  DOB: 07/02/1961  DOS: 06/23/2024  Provider: Eric DELENA Como, MD  MRN: [id number]  Delivery: Face-to-face  Specialty: Interventional Pain Management  Type: Established Patient  Setting: Ambulatory outpatient facility  Specialty designation: 09  Referring Prov.: Alyse Bradley, MD  Location: Outpatient office facility       History of present illness (HPI) Danielle [name], a 63 y.o. year old female, is here today because of her Chronic bilateral low back pain without sciatica [M54.50, G89.29]. Danielle [name] primary complain today is No chief complaint on file.  Pertinent problems: Danielle [name] has Cervical spinal cord compression [facility]) (April, 2017); Cervical spinal stenosis; Chronic shoulder pain (1ry area of Pain) (Bilateral) (R>L); Chronic low back pain (3ry area of Pain) (Bilateral) (R>L) w/ sciatica (Right); Lumbar facet syndrome (Bilateral) (R>L); Lumbar spondylosis; Failed back surgical syndrome; Chronic lower extremity pain (Right); Chronic neck pain (Bilateral) (R>L); Chronic cervical radicular pain (Right); Ulnar neuropathy (Left); Hx of cervical spine surgery; Cervical spondylosis; Fibromyalgia; Chronic sacroiliac pain (Bilateral) (L>R); History of total arthroplasty of shoulder (Left); Chronic pain syndrome; Complaints of weakness of lower extremity; Anterolisthesis of lumbar spine (L4/L5); Cervical facet syndrome (HCC); Chronic musculoskeletal pain; Muscle spasm of back; Myofascial pain; Trigger point with back pain; Osteoarthritis of shoulder (Bilateral);  Spondylosis without myelopathy or radiculopathy, lumbosacral region; Cellulitis of leg, right; Chronic trochanteric bursitis (Bilateral); Osteoarthritis involving multiple joints; Chronic shoulder pain after replacement; History of total shoulder replacement (Right); History of total replacement of shoulder joints (Bilateral); Leg edema, left; Spondylosis without myelopathy or radiculopathy, cervical region; Chronic hip pain (Right); Unilateral primary osteoarthritis, right hip; Chronic sacroiliac joint pain (Left); Other spondylosis, sacral and sacrococcygeal region; Sacroiliac joint dysfunction (Left); Somatic dysfunction of sacroiliac joint (Left); Enthesopathy of sacroiliac joint (Left); DDD (degenerative disc disease), cervical; DDD (degenerative disc disease), lumbosacral; Other intervertebral disc degeneration, lumbar region; Acetabular labrum tear, sequela (Left); Acetabular labrum tear, sequela (Right); Epidural fibrosis (Left: L5-S1); Chronic groin pain (Bilateral); Abnormal MRI, lumbar spine (09/28/2020); Abnormal MRI, hip (Bilateral) (10/04/2020); Chronic shoulder pain (Left); Cervicalgia; Chronic neck pain with history of cervical spinal surgery; Chronic hip pain (Left); Chronic groin pain (Left); Abnormal drug screen (11/21/2021); Chronic sacroiliac joint pain (Right); Chronic pain disorder; Arthralgia of hip; Trochanteric bursitis of hip (Right); Left thigh pain; Right buttock pain; Somatic dysfunction of sacroiliac joint (Right); Sacroiliac joint dysfunction (Right); Chronic low back pain (Bilateral) (R>L) w/o sciatica; Lumbar facet joint pain; Osteoarthritis of facet joint of lumbar spine; Abnormal CT scan, cervical spine (07/18/2021); Osteoarthritis of hip (Left); Status post total replacement of hip (Right); History of total hip replacement (Right); Acute pain of right wrist; Chronic wrist pain (Right); Arthritis of carpometacarpal [facility]) joint of thumb (Right); and History of thrush on their  pertinent problem list.  Pain Assessment: Severity of   is reported as a  /10. Location:    / . Onset:  . Quality:  . Timing:  . Modifying factor(s):  SABRA Vitals:  vitals were not taken for this visit.  BMI: Estimated body mass index is 38.94 kg/m as calculated from the following:   Height as of 05/13/24: 5' 5 (1.651 m).  Weight as of 05/13/24: 234 lb (106.1 kg).  Last encounter: 03/01/2024. Last procedure: 05/13/2024.  Reason for encounter: post-procedure evaluation and assessment.   Discussed the use of AI scribe software for clinical note transcription with the patient, who gave verbal consent to proceed.  History of Present Illness          Post-Procedure Evaluation   Procedure: Lumbar Facet, Medial Branch Radiofrequency Ablation (RFA) #3  Laterality: Left (-LT)  Level: L2, L3, L4, L5, and S1 Medial Branch Level(s). These levels will denervate the L3-4, L4-5, and L5-S1 lumbar facet joints.  Imaging: Fluoroscopy-guided Spinal ([id number]) Anesthesia: Local anesthesia (1-2% Lidocaine ) Anxiolysis: IV Versed  2.5 mg Sedation: Minimal Sedation Fentanyl  2.0 mL (100 mcg) DOS: 05/13/2024  Performed by: Eric DELENA Como, MD  Purpose: Therapeutic/Palliative Indications: Low back pain severe enough to impact quality of life or function. Indications: 1. Chronic low back pain (Bilateral) (R>L) w/o sciatica   2. Lumbar facet joint pain   3. Lumbar facet syndrome (Bilateral) (R>L)   4. Osteoarthritis of facet joint of lumbar spine   5. Spondylosis without myelopathy or radiculopathy, lumbosacral region   6. Failed back surgical syndrome   7. History of thrush   8. History of multiple allergies    Danielle [name] has been dealing with the above chronic pain for longer than three months and has either failed to respond, was unable to tolerate, or simply did not get enough benefit from other more conservative therapies including, but not limited to: 1. Over-the-counter medications 2.  Anti-inflammatory medications 3. Muscle relaxants 4. Membrane stabilizers 5. Opioids 6. Physical therapy and/or chiropractic manipulation 7. Modalities (Heat, ice, etc.) 8. Invasive techniques such as nerve blocks. Danielle [name] has attained more than 50% relief of the pain from a series of diagnostic injections conducted in separate occasions.  Pain Score: Pre-procedure: 2 /10 Post-procedure: 3 /10    Effectiveness:  Initial hour after procedure:   ***. Subsequent 4-6 hours post-procedure:   ***. Analgesia past initial 6 hours:   ***. Ongoing improvement:  Analgesic:  *** Function:    ***    ROM:    ***    Interpretation: ***  Pharmacotherapy Assessment   Opioid Analgesic: No chronic opioid analgesics therapy prescribed by our practice.  Discontinued (11/21/2021) UDS. (+) COCAINE.  Monitoring: [place] PMP: PDMP reviewed during this encounter.       Pharmacotherapy: No side-effects or adverse reactions reported. Compliance: No problems identified. Effectiveness: Clinically acceptable.  No notes on file  UDS:  Summary  Date Value Ref Range Status  11/21/2021 Note  Final    Comment:    ==================================================================== [employer] Select 13 (MW) ==================================================================== Test                             Result       Flag       Units  Drug Present and Declared for Prescription Verification   Oxycodone                       >2433        EXPECTED   ng/mg creat   Oxymorphone                    164          EXPECTED   ng/mg creat   Noroxycodone                   >  2433        EXPECTED   ng/mg creat   Noroxymorphone                 58           EXPECTED   ng/mg creat    Sources of oxycodone  are scheduled prescription medications.    Oxymorphone, noroxycodone, and noroxymorphone are expected    metabolites of oxycodone . Oxymorphone is also available as a    scheduled prescription medication.  Drug Present  not Declared for Prescription Verification   Methamphetamine                12           UNEXPECTED ng/mg creat    Sources of methamphetamine include illicit sources, as a scheduled    prescription medication, as a metabolite of some prescription drugs,    or use of an l-methamphetamine inhaler.    Oxazepam                       17           UNEXPECTED ng/mg creat    Oxazepam may be administered as a scheduled prescription medication;    it is also an expected metabolite of other benzodiazepine drugs,    including diazepam , chlordiazepoxide, prazepam, clorazepate,    halazepam, and temazepam.    Benzoylecgonine                38           UNEXPECTED ng/mg creat    Benzoylecgonine is a metabolite of cocaine; its presence indicates    use of this drug.  Source is most commonly illicit, but cocaine is    present in some topical anesthetic solutions.  ==================================================================== Test                      Result    Flag   Units      Ref Range   Creatinine              411              mg/dL      >=79 ==================================================================== Declared Medications:  The flagging and interpretation on this report are based on the  following declared medications.  Unexpected results may arise from  inaccuracies in the declared medications.   **Note: The testing scope of this panel includes these medications:   Oxycodone    **Note: The testing scope of this panel does not include the  following reported medications:   Albuterol   Azelastine  Cetirizine  Citalopram  (Celexa )  Diclofenac  (Voltaren )  Docusate (Colace)  Fluticasone  (Trelegy)  Guaifenesin  (Mucinex )  Levothyroxine  (Synthroid )  Naloxone   Nystatin  Supplement  Tizanidine  (Zanaflex )  Umeclidinium (Trelegy)  Vilanterol (Trelegy)  Vitamin D  ==================================================================== For clinical consultation, please call [phone]. ====================================================================     No results found for: CBDTHCR No results found for: D8THCCBX No results found for: D9THCCBX  ROS  Constitutional: Denies any fever or chills Gastrointestinal: No reported hemesis, hematochezia, vomiting, or acute GI distress Musculoskeletal: Denies any acute onset joint swelling, redness, loss of ROM, or weakness Neurological: No reported episodes of acute onset apraxia, aphasia, dysarthria, agnosia, amnesia, paralysis, loss of coordination, or loss of consciousness  Medication Review  Menthol  (Topical Analgesic), Tiotropium Bromide , Vitamin D3, albuterol , aspirin , budesonide-formoterol, cetirizine, citalopram , cyclobenzaprine , dextromethorphan-guaiFENesin , docusate sodium , fluconazole , fluticasone , ipratropium-albuterol , levothyroxine , losartan , and oxyCODONE   History  Review  Allergy: Danielle [name] is allergic to aripiprazole, duloxetine, gabapentin, gold, iodinated contrast media, naproxen sodium, nortriptyline hcl, nsaids, pregabalin, acetaminophen , meperidine, ace inhibitors, aspirin , cefpodoxime, fluoxetine, fluticasone -salmeterol, levofloxacin, lithium, paroxetine, tape, telithromycin, theophylline, topiramate, trazodone, triamcinolone , venlafaxine, bupropion, ketorolac  tromethamine , morphine, and moxifloxacin. Drug: Danielle [name]  reports no history of drug use. Alcohol:  reports that she does not currently use alcohol. Tobacco:  reports that she has been smoking cigarettes. She has a 80 pack-year smoking history. She has never used smokeless tobacco. Social: Danielle [name]  reports that she has been smoking cigarettes. She has a 80 pack-year smoking history. She has never used smokeless tobacco. She reports that she does not currently use alcohol. She reports that she does not use drugs. Medical:  has a past medical history of Acid reflux, Anxiety, Arthritis, Asthma, Bell's palsy, Bursitis, Complication  of anesthesia, COPD (chronic obstructive pulmonary disease) (HCC), Depression, Fibromyalgia, Heart murmur, Hepatitis C, Hiatal hernia, Hyperlipidemia, Hypertension, Hypothyroidism, IBS (irritable bowel syndrome), Insomnia, Osteoarthritis, and Post-nasal drip. Surgical: Danielle [name]  has a past surgical history that includes necksurgery (10/21/2014); Back surgery (2013); Foot surgery (Right); Elbow surgery (Left); Carpal tunnel release (Right); Nose surgery; Partial hysterectomy; Total shoulder replacement (Bilateral); Colonoscopy with propofol  (N/A, 02/05/2017); and Total hip arthroplasty (Right, 11/11/2023). Family: family history includes Cancer in her mother; Gout in her mother; Heart disease in her father.  Laboratory Chemistry Profile   Renal Lab Results  Component Value Date   BUN 18 11/12/2023   CREATININE 1.73 (H) 11/12/2023   BCR 13 02/16/2019   GFRAA >60 04/28/2019   GFRNONAA 33 (L) 11/12/2023    Hepatic Lab Results  Component Value Date   AST 19 10/31/2023   ALT 17 10/31/2023   ALBUMIN  3.5 10/31/2023   ALKPHOS 54 10/31/2023    Electrolytes Lab Results  Component Value Date   NA 131 (L) 11/12/2023   K 4.5 11/12/2023   CL 100 11/12/2023   CALCIUM 8.3 (L) 11/12/2023   MG 2.1 02/16/2019    Bone Lab Results  Component Value Date   25OHVITD1 27 (L) 02/16/2019   25OHVITD2 2.0 02/16/2019   25OHVITD3 25 02/16/2019    Inflammation (CRP: Acute Phase) (ESR: Chronic Phase) Lab Results  Component Value Date   CRP 2 02/16/2019   ESRSEDRATE 23 02/16/2019         Note: Above Lab results reviewed.  Recent Imaging Review  DG PAIN CLINIC C-ARM 1-60 MIN NO REPORT Fluoro was used, but no Radiologist interpretation will be provided.  Please refer to NOTES tab for provider progress note. Note: Reviewed        Physical Exam  Vitals: There were no vitals taken for this visit. BMI: Estimated body mass index is 38.94 kg/m as calculated from the following:   Height as of  05/13/24: 5' 5 (1.651 m).   Weight as of 05/13/24: 234 lb (106.1 kg). Ideal: Patient weight not recorded General appearance: Well nourished, well developed, and well hydrated. In no apparent acute distress Mental status: Alert, oriented x 3 (person, place, & time)       Respiratory: No evidence of acute respiratory distress Eyes: PERLA   Assessment   Diagnosis Status  1. Chronic low back pain (Bilateral) (R>L) w/o sciatica   2. Lumbar facet joint pain   3. Lumbar facet syndrome (Bilateral) (R>L)   4. Postop check    Controlled Controlled Controlled   Updated Problems: No problems updated.  Plan of Care  Problem-specific:  Assessment and Plan  Danielle [name] has a current medication list which includes the following long-term medication(s): albuterol , citalopram , fluconazole , fluticasone , ipratropium-albuterol , levothyroxine , losartan , oxycodone , oxycodone , and symbicort.  Pharmacotherapy (Medications Ordered): No orders of the defined types were placed in this encounter.  Orders:  No orders of the defined types were placed in this encounter.    Interventional Therapies  Risk Factors  Considerations:   Allergy (Anaphylactic-type): Contrast dye.  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 (+) COCAINE.   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) (04/29/2024)    Under consideration:   Therapeutic left IA hip injection + TBI #3  Therapeutic bilateral lumbar facet RFA #3 (starting with the right side and following up with the left 2 weeks later)   Completed:   Palliative left trochanteric bursa x2 (09/18/2021) (100/100/90)  Palliative right trochanteric bursa x4 (09/17/2022) (100/100/90/70)  Palliative right suprascapular NB x3 (08/08/2020)  (50/50/100/90-100)   Palliative left suprascapular NB x4 (02/22/2021) (100/100/90/90)  Palliative bilateral suprascapular nerve RFA x1 (05/13/2019) (100/100/90/90-100)  Therapeutic right IA hip joint injection x6 (09/17/2022) (100/100/70/70)  Therapeutic left IA hip joint injection x4 (09/18/2021) (100/100/90)  Diagnostic bilateral lumbar facet MBB x6 (01/15/2024) (100/100/100 x 3 days/40)  Palliative right lumbar facet RFA x2 (12/16/2017) (100/100/85)  Palliative left lumbar facet RFA x2 (02/12/2018) [facility])  Diagnostic right cervical facet MBB x2 (04/11/2022) (100/100/100/100)  Diagnostic left cervical facet MBB x1 (09/30/2016) (90/100/0)  Diagnostic right SI joint block x1 (06/12/2017) (DNKFU -0/0/80)    Therapeutic  Palliative (PRN) options:  Therapeutic suprascapular nerve RFA  Therapeutic lumbar facet RFA    Pharmacotherapy  Nonopioids transferred 05/15/2020: Zanaflex  and Voltaren  gel      No follow-ups on file.    Recent Visits Date Type Provider Dept  05/13/24 Procedure visit Tanya Glisson, MD [facility]  04/29/24 Procedure visit Tanya Glisson, MD [facility]  Showing recent visits within past 90 days and meeting all other requirements Future Appointments Date Type Provider Dept  06/23/24 Appointment Tanya Glisson, MD [facility]  Showing future appointments within next 90 days and meeting all other requirements  I discussed the assessment and treatment plan with the patient. The patient was provided an opportunity to ask questions and all were answered. The patient agreed with the plan and demonstrated an understanding of the instructions.  Patient advised to call back or seek an in-person evaluation if the symptoms or condition worsens.  Duration of encounter: *** minutes.  Total time on encounter, as per AMA guidelines included both the face-to-face and non-face-to-face time personally spent by the physician and/or other qualified  health care professional(s) on the day of the encounter (includes time in activities that require the physician or other qualified health care professional and does not include time in activities normally performed by clinical staff). Physician's time may include the following activities when performed: Preparing to see the patient (e.g., pre-charting review of records, searching for previously ordered imaging, lab work, and nerve conduction tests) Review of prior analgesic pharmacotherapies. Reviewing PMP Interpreting ordered tests (e.g., lab work, imaging, nerve conduction tests) Performing post-procedure evaluations, including interpretation of diagnostic procedures Obtaining and/or reviewing separately obtained history Performing a medically appropriate examination and/or evaluation Counseling and educating the patient/family/caregiver Ordering medications, tests, or procedures Referring and communicating with other  health care professionals (when not separately reported) Documenting clinical information in the electronic or other health record Independently interpreting results (not separately reported) and communicating results to the patient/ family/caregiver Care coordination (not separately reported)  Note by: Eric DELENA Como, MD (TTS and AI technology used. I apologize for any typographical errors that were not detected and corrected.) Date: 06/23/2024; Time: 6:12 PM

## 2024-06-23 ENCOUNTER — Ambulatory Visit: Admitting: Pain Medicine

## 2024-06-23 DIAGNOSIS — Z09 Encounter for follow-up examination after completed treatment for conditions other than malignant neoplasm: Secondary | ICD-10-CM

## 2024-06-23 DIAGNOSIS — M5459 Other low back pain: Secondary | ICD-10-CM

## 2024-06-23 DIAGNOSIS — Z91199 Patient's noncompliance with other medical treatment and regimen due to unspecified reason: Secondary | ICD-10-CM

## 2024-06-23 DIAGNOSIS — M47816 Spondylosis without myelopathy or radiculopathy, lumbar region: Secondary | ICD-10-CM

## 2024-06-23 DIAGNOSIS — M545 Low back pain, unspecified: Secondary | ICD-10-CM

## 2024-06-23 NOTE — Patient Instructions (Signed)

## 2024-07-19 ENCOUNTER — Ambulatory Visit: Admitting: Orthopaedic Surgery

## 2024-07-21 ENCOUNTER — Ambulatory Visit: Admitting: Pain Medicine

## 2024-07-21 ENCOUNTER — Other Ambulatory Visit: Payer: Self-pay | Admitting: Pain Medicine

## 2024-07-21 DIAGNOSIS — Z8619 Personal history of other infectious and parasitic diseases: Secondary | ICD-10-CM

## 2024-08-03 NOTE — Progress Notes (Unsigned)
 PROVIDER NOTE: Interpretation of information contained herein should be left to medically-trained personnel. Specific patient instructions are provided elsewhere under Patient Instructions section of medical record. This document was created in part using AI and STT-dictation technology, any transcriptional errors that may result from this process are unintentional.  Patient: Danielle [name]  Service: E/M   PCP: Alyse Bradley, MD  DOB: Nov 16, 1960  DOS: 08/04/2024  Provider: Eric DELENA Como, MD  MRN: [id number]  Delivery: Face-to-face  Specialty: Interventional Pain Management  Type: Established Patient  Setting: Ambulatory outpatient facility  Specialty designation: 09  Referring Prov.: Alyse Bradley, MD  Location: Outpatient office facility       History of present illness (HPI) Danielle [name], a 64 y.o. year old female, is here today because of her Lumbar facet joint pain [M54.59]. Danielle [name] primary complain today is Back Pain (Both side, left hip and left hand)  Pertinent problems: Danielle [name] has Cervical spinal cord compression [facility]) (April, 2017); Cervical spinal stenosis; Chronic shoulder pain (1ry area of Pain) (Bilateral) (R>L); Chronic low back pain (3ry area of Pain) (Bilateral) (R>L) w/ sciatica (Right); Lumbar facet syndrome (Bilateral) (R>L); Lumbar spondylosis; Failed back surgical syndrome; Chronic lower extremity pain (Right); Chronic neck pain (Bilateral) (R>L); Chronic cervical radicular pain (Right); Ulnar neuropathy (Left); Hx of cervical spine surgery; Cervical spondylosis; Fibromyalgia; Chronic sacroiliac pain (Bilateral) (L>R); History of total arthroplasty of shoulder (Left); Chronic pain syndrome; Complaints of weakness of lower extremity; Anterolisthesis of lumbar spine (L4/L5); Cervical facet syndrome (HCC); Chronic musculoskeletal pain; Muscle spasm of back; Myofascial pain; Trigger point with back pain; Osteoarthritis of shoulder (Bilateral); Spondylosis without  myelopathy or radiculopathy, lumbosacral region; Cellulitis of leg, right; Chronic trochanteric bursitis (Bilateral); Osteoarthritis involving multiple joints; Chronic shoulder pain after replacement; History of total shoulder replacement (Right); History of total replacement of shoulder joints (Bilateral); Leg edema, left; Spondylosis without myelopathy or radiculopathy, cervical region; Chronic hip pain (Right); Unilateral primary osteoarthritis, right hip; Chronic sacroiliac joint pain (Left); Other spondylosis, sacral and sacrococcygeal region; Sacroiliac joint dysfunction (Left); Somatic dysfunction of sacroiliac joint (Left); Enthesopathy of sacroiliac joint (Left); DDD (degenerative disc disease), cervical; DDD (degenerative disc disease), lumbosacral; Other intervertebral disc degeneration, lumbar region; Acetabular labrum tear, sequela (Left); Acetabular labrum tear, sequela (Right); Epidural fibrosis (Left: L5-S1); Chronic groin pain (Bilateral); Abnormal MRI, lumbar spine (09/28/2020); Abnormal MRI, hip (Bilateral) (10/04/2020); Chronic shoulder pain (Left); Cervicalgia; Chronic neck pain with history of cervical spinal surgery; Chronic hip pain (Left); Chronic groin pain (Left); Abnormal drug screen (11/21/2021); Chronic sacroiliac joint pain (Right); Chronic pain disorder; Arthralgia of hip; Trochanteric bursitis of hip (Right); Left thigh pain; Right buttock pain; Somatic dysfunction of sacroiliac joint (Right); Sacroiliac joint dysfunction (Right); Chronic low back pain (Bilateral) (R>L) w/o sciatica; Lumbar facet joint pain; Osteoarthritis of facet joint of lumbar spine; Abnormal CT scan, cervical spine (07/18/2021); Osteoarthritis of hip (Left); Status post total replacement of hip (Right); History of total hip replacement (Right); Acute pain of right wrist; Chronic wrist pain (Right); Arthritis of carpometacarpal (CMC) joint of thumb (Right); History of thrush; Chronic low back pain (Bilateral) w/  sciatica (Bilateral); Chronic hip pain after total replacement of hip joint (Right); Lumbar back pain with radiculopathy affecting lower extremity; Radiculopathy of lumbar region; Lumbosacral radiculopathy at L2; Lumbosacral radiculopathy at L1; and Chronic hip pain (Bilateral) on their pertinent problem list.  Pain Assessment: Severity of   is reported as a 5 /10. Location: Back (left hip and hand) Left, Right/Denies. Onset: More than  a month ago. Quality: Aching, Burning, Constant, Throbbing, Stabbing, Discomfort. Timing: Constant. Modifying factor(s): nothing. Vitals:  height is 5' 5 (1.651 m) and weight is 230 lb (104.3 kg). Her temperature is 97.7 F (36.5 C). Her blood pressure is 140/96 (abnormal). Her oxygen saturation is 98%.  BMI: Estimated body mass index is 38.27 kg/m as calculated from the following:   Height as of this encounter: 5' 5 (1.651 m).   Weight as of this encounter: 230 lb (104.3 kg).  Last encounter: 03/01/2024. Last procedure: 05/13/2024.  Reason for encounter: post-procedure evaluation and assessment.   Discussed the use of AI scribe software for clinical note transcription with the patient, who gave verbal consent to proceed.  History of Present Illness   Danielle [name] is a 64 year old female who presents for post-procedure evaluation and pain assessment following bilateral lumbar facet radiofrequency ablation.  She underwent bilateral lumbar facet radiofrequency ablation with the left side on May 13, 2024 and the right side on April 29, 2024. She had complete relief on the left during the local anesthetic phase and about 45% improvement immediately after, but she now feels there was no short-term benefit overall. She notes about 30% improvement beginning 14 days post-procedure that lasted 2 to 3 weeks.  She currently has bilateral low back pain with occasional radiation to the legs, plus left hip and left hand pain. The back pain is localized to  the low back. She has prior lumbar disc removal and recent stumbling and balance issues. She has not had recent physical therapy and her last lumbar MRI was about 1.5 to 2 years ago.  She had a right hip replacement in April 2025 without surgical complications but has persistent lateral right hip pain and a sensation of a prominence. She did not do post-operative physical therapy. The left hip is native, and she has bilateral groin pain, worse on the left, and expects she may need left hip surgery.  Her left hand has had pain in the hypothenar region for about a week without known injury. She has had similar symptoms in the past and reports prior right hand pain that has improved.  She has COPD and a recent cold that delayed follow-up hip imaging. She has been losing weight from 265 pounds to about 232 pounds.      Post-Procedure Evaluation   Procedure: Lumbar Facet, Medial Branch Radiofrequency Ablation (RFA) #3  Laterality: Left (-LT) (05/13/2024)   (Right) (04/29/2024)  Level: L2, L3, L4, L5, and S1 Medial Branch Level(s). These levels will denervate the L3-4, L4-5, and L5-S1 lumbar facet joints.  Imaging: Fluoroscopy-guided Spinal ([id number]) Anesthesia: Local anesthesia (1-2% Lidocaine ) Anxiolysis: IV Versed  2.5 mg Sedation: Minimal Sedation Fentanyl  2.0 mL (100 mcg) DOS: 05/13/2024  Performed by: Eric DELENA Como, MD  Purpose: Therapeutic/Palliative Indications: Low back pain severe enough to impact quality of life or function. Indications: 1. Chronic low back pain (Bilateral) (R>L) w/o sciatica   2. Lumbar facet joint pain   3. Lumbar facet syndrome (Bilateral) (R>L)   4. Osteoarthritis of facet joint of lumbar spine   5. Spondylosis without myelopathy or radiculopathy, lumbosacral region   6. Failed back surgical syndrome   7. History of thrush   8. History of multiple allergies    Pain Score: Pre-procedure: 2 /10 Post-procedure: 3 /10    Effectiveness:  Initial hour  after procedure: 0 %. Subsequent 4-6 hours post-procedure: 0 %. Analgesia past initial 6 hours: 30 % (no relief after 14  days, got 30% that lasted for 2 to 3 weeks). Ongoing improvement:  Analgesic: The patient did not keep her scheduled postprocedure evaluations but today she comes in indicating that she did not obtain any relief of the pain for the duration of local anesthetic, she had a flareup after the procedure, and the best relief she attained was only 30% that lasted for about 2 to 3 weeks and it took approximately 14 days for her to experience that relief. Function: Minimal improvement ROM: No benefit Interpretation: Based on the results of this radiofrequency, clearly the patient's primary source of her low back pain is along the longer the facet joints and therefore it is unlikely that we will repeat that radiofrequency again.  Pharmacotherapy Assessment   Opioid Analgesic: No chronic opioid analgesics therapy prescribed by our practice.  Discontinued (11/21/2021) UDS. (+) COCAINE.  Monitoring: [place] PMP: PDMP reviewed during this encounter.       Pharmacotherapy: No side-effects or adverse reactions reported. Compliance: No problems identified. Effectiveness: Clinically acceptable.  Delores Dorothe LABOR, RN  08/04/2024  8:16 AM  Sign when Signing Visit Safety precautions to be maintained throughout the outpatient stay will include: orient to surroundings, keep bed in low position, maintain call bell within reach at all times, provide assistance with transfer out of bed and ambulation.     UDS:  Summary  Date Value Ref Range Status  11/21/2021 Note  Final    Comment:    ==================================================================== [employer] Select 13 (MW) ==================================================================== Test                             Result       Flag       Units  Drug Present and Declared for Prescription Verification   Oxycodone                        >2433        EXPECTED   ng/mg creat   Oxymorphone                    164          EXPECTED   ng/mg creat   Noroxycodone                   >2433        EXPECTED   ng/mg creat   Noroxymorphone                 58           EXPECTED   ng/mg creat    Sources of oxycodone  are scheduled prescription medications.    Oxymorphone, noroxycodone, and noroxymorphone are expected    metabolites of oxycodone . Oxymorphone is also available as a    scheduled prescription medication.  Drug Present not Declared for Prescription Verification   Methamphetamine                12           UNEXPECTED ng/mg creat    Sources of methamphetamine include illicit sources, as a scheduled    prescription medication, as a metabolite of some prescription drugs,    or use of an l-methamphetamine inhaler.    Oxazepam                       17  UNEXPECTED ng/mg creat    Oxazepam may be administered as a scheduled prescription medication;    it is also an expected metabolite of other benzodiazepine drugs,    including diazepam , chlordiazepoxide, prazepam, clorazepate,    halazepam, and temazepam.    Benzoylecgonine                38           UNEXPECTED ng/mg creat    Benzoylecgonine is a metabolite of cocaine; its presence indicates    use of this drug.  Source is most commonly illicit, but cocaine is    present in some topical anesthetic solutions.  ==================================================================== Test                      Result    Flag   Units      Ref Range   Creatinine              411              mg/dL      >=79 ==================================================================== Declared Medications:  The flagging and interpretation on this report are based on the  following declared medications.  Unexpected results may arise from  inaccuracies in the declared medications.   **Note: The testing scope of this panel includes these medications:   Oxycodone    **Note: The testing  scope of this panel does not include the  following reported medications:   Albuterol   Azelastine  Cetirizine  Citalopram  (Celexa )  Diclofenac  (Voltaren )  Docusate (Colace)  Fluticasone  (Trelegy)  Guaifenesin  (Mucinex )  Levothyroxine  (Synthroid )  Naloxone   Nystatin  Supplement  Tizanidine  (Zanaflex )  Umeclidinium (Trelegy)  Vilanterol (Trelegy)  Vitamin D  ==================================================================== For clinical consultation, please call [phone]. ====================================================================     No results found for: CBDTHCR No results found for: D8THCCBX No results found for: D9THCCBX  ROS  Constitutional: Denies any fever or chills Gastrointestinal: No reported hemesis, hematochezia, vomiting, or acute GI distress Musculoskeletal: Denies any acute onset joint swelling, redness, loss of ROM, or weakness Neurological: No reported episodes of acute onset apraxia, aphasia, dysarthria, agnosia, amnesia, paralysis, loss of coordination, or loss of consciousness  Medication Review  Menthol  (Topical Analgesic), Vitamin D3, albuterol , citalopram , docusate sodium , fluticasone , ipratropium-albuterol , levothyroxine , and losartan   History Review  Allergy: Ms. Hauser is allergic to aripiprazole, duloxetine, gabapentin, gold, iodinated contrast media, naproxen sodium, nortriptyline hcl, nsaids, pregabalin, acetaminophen , meperidine, ace inhibitors, aspirin , cefpodoxime, fluoxetine, fluticasone -salmeterol, levofloxacin, lithium, paroxetine, tape, telithromycin, theophylline, topiramate, trazodone, triamcinolone , venlafaxine, bupropion, ketorolac  tromethamine , morphine, and moxifloxacin. Drug: Ms. Abid  reports no history of drug use. Alcohol:  reports that she does not currently use alcohol. Tobacco:  reports that she has been smoking cigarettes. She has a 80 pack-year smoking history. She has never used smokeless  tobacco. Social: Ms. Schreckengost  reports that she has been smoking cigarettes. She has a 80 pack-year smoking history. She has never used smokeless tobacco. She reports that she does not currently use alcohol. She reports that she does not use drugs. Medical:  has a past medical history of Acid reflux, Anxiety, Arthritis, Asthma, Bell's palsy, Bursitis, Complication of anesthesia, COPD (chronic obstructive pulmonary disease) (HCC), Depression, Fibromyalgia, Heart murmur, Hepatitis C, Hiatal hernia, Hyperlipidemia, Hypertension, Hypothyroidism, IBS (irritable bowel syndrome), Insomnia, Osteoarthritis, and Post-nasal drip. Surgical: Ms. Tavenner  has a past surgical history that includes necksurgery (10/21/2014); Back surgery (2013); Foot surgery (Right); Elbow surgery (Left); Carpal tunnel release (Right); Nose surgery; Partial hysterectomy; Total  shoulder replacement (Bilateral); Colonoscopy with propofol  (N/A, 02/05/2017); and Total hip arthroplasty (Right, 11/11/2023). Family: family history includes Cancer in her mother; Gout in her mother; Heart disease in her father.  Laboratory Chemistry Profile   Renal Lab Results  Component Value Date   BUN 18 11/12/2023   CREATININE 1.73 (H) 11/12/2023   BCR 13 02/16/2019   GFRAA >60 04/28/2019   GFRNONAA 33 (L) 11/12/2023    Hepatic Lab Results  Component Value Date   AST 19 10/31/2023   ALT 17 10/31/2023   ALBUMIN  3.5 10/31/2023   ALKPHOS 54 10/31/2023    Electrolytes Lab Results  Component Value Date   NA 131 (L) 11/12/2023   K 4.5 11/12/2023   CL 100 11/12/2023   CALCIUM 8.3 (L) 11/12/2023   MG 2.1 02/16/2019    Bone Lab Results  Component Value Date   25OHVITD1 27 (L) 02/16/2019   25OHVITD2 2.0 02/16/2019   25OHVITD3 25 02/16/2019    Inflammation (CRP: Acute Phase) (ESR: Chronic Phase) Lab Results  Component Value Date   CRP 2 02/16/2019   ESRSEDRATE 23 02/16/2019         Note: Above Lab results reviewed.  Recent Imaging  Review  DG PAIN CLINIC C-ARM 1-60 MIN NO REPORT Fluoro was used, but no Radiologist interpretation will be provided.  Please refer to NOTES tab for provider progress note. Note: Reviewed        Physical Exam  Vitals: BP (!) 140/96   Temp 97.7 F (36.5 C)   Ht 5' 5 (1.651 m)   Wt 230 lb (104.3 kg)   SpO2 98%   BMI 38.27 kg/m  BMI: Estimated body mass index is 38.27 kg/m as calculated from the following:   Height as of this encounter: 5' 5 (1.651 m).   Weight as of this encounter: 230 lb (104.3 kg). Ideal: Ideal body weight: 57 kg (125 lb 10.6 oz) Adjusted ideal body weight: 75.9 kg (167 lb 6.4 oz) General appearance: Well nourished, well developed, and well hydrated. In no apparent acute distress Mental status: Alert, oriented x 3 (person, place, & time)       Respiratory: No evidence of acute respiratory distress Eyes: PERLA   Assessment   Diagnosis Status  1. Lumbar facet joint pain   2. 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 Postop check   4. Chronic low back pain (Bilateral) w/ sciatica (Bilateral)   5. Chronic hip pain after total replacement of hip joint (Right)   6. Chronic groin pain (Bilateral)   7. Lumbar back pain with radiculopathy affecting lower extremity   8. Radiculopathy of lumbar region   9. Lumbosacral radiculopathy at L2   10. Lumbosacral radiculopathy at L1   11. Chronic hip pain (Bilateral)    Controlled Controlled Controlled   Updated Problems: Problem  Chronic low back pain (Bilateral) w/ sciatica (Bilateral)  Chronic hip pain after total replacement of hip joint (Right)  Lumbar Back Pain With Radiculopathy Affecting Lower Extremity  Radiculopathy of Lumbar Region  Lumbosacral Radiculopathy At L2  Lumbosacral Radiculopathy At L1  Chronic hip pain (Bilateral)    Plan of Care  Problem-specific:  Assessment and Plan    Chronic low back pain with lumbar facet syndrome   She experiences bilateral low back pain with 30%  improvement following bilateral lumbar facet radiofrequency, lasting 2-3 weeks. Pain radiates to both hips and occasionally down the legs, with possible involvement of L1-L2 or L2-L3. There has been no recent physical  therapy, and she has a history of disc surgery in the same area. Differential diagnosis includes lumbar spine pathology at L1-L2 or L2-L3 levels. An MRI of the lumbar spine is ordered. Physical therapy for back pain is initiated. A follow-up appointment will be scheduled to review MRI results.       Ms. CHIHIRO FREY has a current medication list which includes the following long-term medication(s): albuterol , citalopram , fluticasone , ipratropium-albuterol , levothyroxine , and losartan .  Pharmacotherapy (Medications Ordered): No orders of the defined types were placed in this encounter.  Orders:  Orders Placed This Encounter  Procedures   MR LUMBAR SPINE WO CONTRAST    Patient presents with axial pain with possible radicular component. Please assist us  in identifying specific level(s) and laterality of any additional findings such as: 1. Facet (Zygapophyseal) joint DJD (Hypertrophy, space narrowing, subchondral sclerosis, and/or osteophyte formation) 2. DDD and/or IVDD (Loss of disc height, desiccation, gas patterns, osteophytes, endplate sclerosis, or Black disc disease) 3. Pars defects 4. Spondylolisthesis, spondylosis, and/or spondyloarthropathies (include Degree/Grade of displacement in mm) (stability) 5. Vertebral body Fractures (acute/chronic) (state percentage of collapse) 6. Demineralization (osteopenia/osteoporotic) 7. Bone pathology 8. Foraminal narrowing  9. Surgical changes 10. Central, Lateral Recess, and/or Foraminal Stenosis (include AP diameter of stenosis in mm) 11. Surgical changes (hardware type, status, and presence of fibrosis) 12. Modic Type Changes (MRI only) 13. IVDD (Disc bulge, protrusion, herniation, extrusion) (Level, laterality, extent)     Standing Status:   Future    Expiration Date:   11/02/2024    Scheduling Instructions:     Please make sure that the patient understands that this needs to be done as soon as possible. Never have the patient do the imaging just before the next appointment. Inform patient that having the imaging done within the [facility] will expedite the availability of the results and will provide      imaging availability to the requesting physician. In addition inform the patient that the imaging order has an expiration date and will not be renewed if not done within the active period.    What is the patient's sedation requirement?:   No Sedation    Does the patient have a pacemaker or implanted devices?:   No    Preferred imaging location?:   [facility] (table limit-350lbs)    Call Results- Best Contact Number?:   [phone] [facility]    Radiology Contrast Protocol - do NOT remove file path:   [email or web address]   Ambulatory referral to Physical Therapy    Referral Priority:   Routine    Referral Type:   Physical Medicine    Referral Reason:   Specialty Services Required    Requested Specialty:   Physical Therapy    Number of Visits Requested:   1   Nursing Instructions:    Please complete this patient's postprocedure evaluation.    Scheduling Instructions:     Please complete this patient's postprocedure evaluation.     Interventional Therapies  Risk Factors  Considerations:   Allergy (Anaphylactic-type): Contrast dye.  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 (+) COCAINE.   Planned  Pending:      Under consideration:   Therapeutic left IA hip injection + TBI #3    Completed:   Palliative left trochanteric bursa x2 (09/18/2021) (100/100/90)  Palliative right trochanteric  bursa x4 (09/17/2022) (100/100/90/70)  Palliative right suprascapular NB x3 (08/08/2020) (50/50/100/90-100)   Palliative left suprascapular NB x4 (02/22/2021) (100/100/90/90)  Palliative bilateral suprascapular nerve RFA x1 (05/13/2019) (100/100/90/90-100)  Therapeutic right IA hip joint injection x6 (09/17/2022) (100/100/70/70)  Therapeutic left IA hip joint injection x4 (09/18/2021) (100/100/90)  Diagnostic bilateral lumbar facet MBB x6 (01/15/2024) (100/100/100 x 3 days/40)  Palliative right lumbar facet RFA x3 (05/13/2024) (100/100/45/0) (Do Not Repeat)  Palliative left lumbar facet RFA x3 (05/13/2024) (DNKFU) (0/0/30/0) (Do Not Repeat)  Diagnostic right cervical facet MBB x2 (04/11/2022) (100/100/100/100)  Diagnostic left cervical facet MBB x1 (09/30/2016) (90/100/0)  Diagnostic right SI joint block x1 (06/12/2017) (DNKFU -0/0/80)    Therapeutic  Palliative (PRN) options:  None without F2F evaluation by provider.   Pharmacotherapy  Nonopioids transferred 05/15/2020: Zanaflex  and Voltaren  gel      Return for (Face2F), to review of ordered test(s) (after MRI).    Recent Visits Date Type Provider Dept  05/13/24 Procedure visit Tanya Glisson, MD [facility]  Showing recent visits within past 90 days and meeting all other requirements Today's Visits Date Type Provider Dept  08/04/24 Office Visit Tanya Glisson, MD [facility]  Showing today's visits and meeting all other requirements Future Appointments No visits were found meeting these conditions. Showing future appointments within next 90 days and meeting all other requirements  I discussed the assessment and treatment plan with the patient. The patient was provided an opportunity to ask questions and all were answered. The patient agreed with the plan and demonstrated an understanding of the instructions.  Patient advised to call back or seek an in-person evaluation if the symptoms or condition  worsens.  Duration of encounter: 35 minutes.  Total time on encounter, as per AMA guidelines included both the face-to-face and non-face-to-face time personally spent by the physician and/or other qualified health care professional(s) on the day of the encounter (includes time in activities that require the physician or other qualified health care professional and does not include time in activities normally performed by clinical staff). Physician's time may include the following activities when performed: Preparing to see the patient (e.g., pre-charting review of records, searching for previously ordered imaging, lab work, and nerve conduction tests) Review of prior analgesic pharmacotherapies. Reviewing PMP Interpreting ordered tests (e.g., lab work, imaging, nerve conduction tests) Performing post-procedure evaluations, including interpretation of diagnostic procedures Obtaining and/or reviewing separately obtained history Performing a medically appropriate examination and/or evaluation Counseling and educating the patient/family/caregiver Ordering medications, tests, or procedures Referring and communicating with other health care professionals (when not separately reported) Documenting clinical information in the electronic or other health record Independently interpreting results (not separately reported) and communicating results to the patient/ family/caregiver Care coordination (not separately reported)  Note by: Glisson DELENA Tanya, MD (TTS and AI technology used. I apologize for any typographical errors that were not detected and corrected.) Date: 08/04/2024; Time: 11:47 AM

## 2024-08-04 ENCOUNTER — Telehealth: Payer: Self-pay

## 2024-08-04 ENCOUNTER — Ambulatory Visit: Admitting: Pain Medicine

## 2024-08-04 ENCOUNTER — Encounter: Payer: Self-pay | Admitting: Pain Medicine

## 2024-08-04 VITALS — BP 140/96 | Temp 97.7°F | Ht 65.0 in | Wt 230.0 lb

## 2024-08-04 DIAGNOSIS — M5417 Radiculopathy, lumbosacral region: Secondary | ICD-10-CM | POA: Insufficient documentation

## 2024-08-04 DIAGNOSIS — M5416 Radiculopathy, lumbar region: Secondary | ICD-10-CM | POA: Insufficient documentation

## 2024-08-04 DIAGNOSIS — R1031 Right lower quadrant pain: Secondary | ICD-10-CM | POA: Insufficient documentation

## 2024-08-04 DIAGNOSIS — M5441 Lumbago with sciatica, right side: Secondary | ICD-10-CM | POA: Diagnosis not present

## 2024-08-04 DIAGNOSIS — G8929 Other chronic pain: Secondary | ICD-10-CM | POA: Diagnosis present

## 2024-08-04 DIAGNOSIS — M5459 Other low back pain: Secondary | ICD-10-CM | POA: Diagnosis present

## 2024-08-04 DIAGNOSIS — M5442 Lumbago with sciatica, left side: Secondary | ICD-10-CM | POA: Diagnosis not present

## 2024-08-04 DIAGNOSIS — M25551 Pain in right hip: Secondary | ICD-10-CM | POA: Diagnosis not present

## 2024-08-04 DIAGNOSIS — Z09 Encounter for follow-up examination after completed treatment for conditions other than malignant neoplasm: Secondary | ICD-10-CM | POA: Diagnosis present

## 2024-08-04 DIAGNOSIS — R1032 Left lower quadrant pain: Secondary | ICD-10-CM | POA: Insufficient documentation

## 2024-08-04 DIAGNOSIS — Z96641 Presence of right artificial hip joint: Secondary | ICD-10-CM | POA: Insufficient documentation

## 2024-08-04 DIAGNOSIS — M25552 Pain in left hip: Secondary | ICD-10-CM | POA: Diagnosis not present

## 2024-08-04 DIAGNOSIS — M47816 Spondylosis without myelopathy or radiculopathy, lumbar region: Secondary | ICD-10-CM | POA: Diagnosis not present

## 2024-08-04 NOTE — Progress Notes (Signed)
 Safety precautions to be maintained throughout the outpatient stay will include: orient to surroundings, keep bed in low position, maintain call bell within reach at all times, provide assistance with transfer out of bed and ambulation.

## 2024-08-04 NOTE — Telephone Encounter (Signed)
 Unable to get a call through on either phone number we have on file, to notify her of her MRI date. I will send a mychart message.

## 2024-08-06 ENCOUNTER — Ambulatory Visit: Admission: RE | Admit: 2024-08-06 | Source: Ambulatory Visit
